# Patient Record
Sex: Male | Born: 1935
Health system: Southern US, Community
[De-identification: ages and names within clinical notes are randomized; demographics above are authoritative.]

## PROBLEM LIST (undated history)

## (undated) DIAGNOSIS — E785 Hyperlipidemia, unspecified: Secondary | ICD-10-CM

## (undated) DIAGNOSIS — J849 Interstitial pulmonary disease, unspecified: Secondary | ICD-10-CM

## (undated) DIAGNOSIS — K219 Gastro-esophageal reflux disease without esophagitis: Secondary | ICD-10-CM

## (undated) DIAGNOSIS — M1990 Unspecified osteoarthritis, unspecified site: Secondary | ICD-10-CM

## (undated) DIAGNOSIS — I10 Essential (primary) hypertension: Secondary | ICD-10-CM

## (undated) DIAGNOSIS — I2510 Atherosclerotic heart disease of native coronary artery without angina pectoris: Secondary | ICD-10-CM

## (undated) DIAGNOSIS — I2699 Other pulmonary embolism without acute cor pulmonale: Secondary | ICD-10-CM

## (undated) DIAGNOSIS — Z9889 Other specified postprocedural states: Secondary | ICD-10-CM

## (undated) DIAGNOSIS — I509 Heart failure, unspecified: Secondary | ICD-10-CM

## (undated) DIAGNOSIS — Z9861 Coronary angioplasty status: Principal | ICD-10-CM

## (undated) DIAGNOSIS — J479 Bronchiectasis, uncomplicated: Secondary | ICD-10-CM

## (undated) DIAGNOSIS — I272 Pulmonary hypertension, unspecified: Secondary | ICD-10-CM

## (undated) DIAGNOSIS — K2970 Gastritis, unspecified, without bleeding: Secondary | ICD-10-CM

## (undated) DIAGNOSIS — B9681 Helicobacter pylori [H. pylori] as the cause of diseases classified elsewhere: Secondary | ICD-10-CM

## (undated) DIAGNOSIS — I219 Acute myocardial infarction, unspecified: Secondary | ICD-10-CM

## (undated) HISTORY — DX: Hyperlipidemia, unspecified: E78.5

## (undated) HISTORY — DX: Essential (primary) hypertension: I10

## (undated) HISTORY — PX: CORONARY ANGIOPLASTY: SHX604

## (undated) HISTORY — DX: Other specified postprocedural states: Z98.890

## (undated) HISTORY — DX: Atherosclerotic heart disease of native coronary artery without angina pectoris: I25.10

## (undated) HISTORY — DX: Gastro-esophageal reflux disease without esophagitis: K21.9

## (undated) HISTORY — DX: Unspecified osteoarthritis, unspecified site: M19.90

## (undated) HISTORY — DX: Helicobacter pylori (H. pylori) as the cause of diseases classified elsewhere: B96.81

## (undated) HISTORY — DX: Coronary angioplasty status: Z98.61

## (undated) HISTORY — PX: CATARACT EXTRACTION: SUR2

## (undated) HISTORY — DX: Gastritis, unspecified, without bleeding: K29.70

## (undated) HISTORY — DX: Acute myocardial infarction, unspecified: I21.9

## (undated) HISTORY — PX: COLONOSCOPY: SHX174

---
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
## 2000-11-20 ENCOUNTER — Ambulatory Visit (HOSPITAL_COMMUNITY): Admission: RE | Admit: 2000-11-20 | Discharge: 2000-11-20 | Payer: Self-pay | Admitting: Ophthalmology

## 2001-01-01 ENCOUNTER — Ambulatory Visit (HOSPITAL_COMMUNITY): Admission: RE | Admit: 2001-01-01 | Discharge: 2001-01-01 | Payer: Self-pay | Admitting: Ophthalmology

## 2001-09-20 ENCOUNTER — Ambulatory Visit (HOSPITAL_COMMUNITY): Admission: RE | Admit: 2001-09-20 | Discharge: 2001-09-20 | Payer: Self-pay | Admitting: Family Medicine

## 2001-09-20 ENCOUNTER — Encounter: Payer: Self-pay | Admitting: Family Medicine

## 2001-12-11 ENCOUNTER — Encounter: Payer: Self-pay | Admitting: *Deleted

## 2001-12-11 ENCOUNTER — Inpatient Hospital Stay (HOSPITAL_COMMUNITY): Admission: EM | Admit: 2001-12-11 | Discharge: 2001-12-13 | Payer: Self-pay | Admitting: Emergency Medicine

## 2001-12-11 ENCOUNTER — Encounter: Payer: Self-pay | Admitting: Cardiovascular Disease

## 2001-12-11 HISTORY — PX: CARDIAC CATHETERIZATION: SHX172

## 2002-02-09 ENCOUNTER — Encounter: Payer: Self-pay | Admitting: Internal Medicine

## 2002-02-09 ENCOUNTER — Emergency Department (HOSPITAL_COMMUNITY): Admission: EM | Admit: 2002-02-09 | Discharge: 2002-02-09 | Payer: Self-pay | Admitting: Internal Medicine

## 2002-06-17 ENCOUNTER — Ambulatory Visit (HOSPITAL_COMMUNITY): Admission: RE | Admit: 2002-06-17 | Discharge: 2002-06-17 | Payer: Self-pay | Admitting: Family Medicine

## 2002-06-17 ENCOUNTER — Encounter: Payer: Self-pay | Admitting: Family Medicine

## 2003-07-13 ENCOUNTER — Observation Stay (HOSPITAL_COMMUNITY): Admission: EM | Admit: 2003-07-13 | Discharge: 2003-07-14 | Payer: Self-pay | Admitting: *Deleted

## 2003-07-23 ENCOUNTER — Ambulatory Visit (HOSPITAL_COMMUNITY): Admission: RE | Admit: 2003-07-23 | Discharge: 2003-07-23 | Payer: Self-pay | Admitting: Family Medicine

## 2003-08-16 ENCOUNTER — Observation Stay (HOSPITAL_COMMUNITY): Admission: EM | Admit: 2003-08-16 | Discharge: 2003-08-18 | Payer: Self-pay | Admitting: Emergency Medicine

## 2003-08-16 HISTORY — PX: NM MYOCAR PERF WALL MOTION: HXRAD629

## 2005-12-01 ENCOUNTER — Ambulatory Visit: Payer: Self-pay | Admitting: Internal Medicine

## 2006-05-21 ENCOUNTER — Encounter: Payer: Self-pay | Admitting: Internal Medicine

## 2006-05-21 DIAGNOSIS — K219 Gastro-esophageal reflux disease without esophagitis: Secondary | ICD-10-CM | POA: Insufficient documentation

## 2006-05-21 DIAGNOSIS — I252 Old myocardial infarction: Secondary | ICD-10-CM | POA: Insufficient documentation

## 2006-05-21 DIAGNOSIS — R32 Unspecified urinary incontinence: Secondary | ICD-10-CM | POA: Insufficient documentation

## 2006-05-21 DIAGNOSIS — N318 Other neuromuscular dysfunction of bladder: Secondary | ICD-10-CM | POA: Insufficient documentation

## 2006-05-21 DIAGNOSIS — H269 Unspecified cataract: Secondary | ICD-10-CM | POA: Insufficient documentation

## 2006-05-21 DIAGNOSIS — E785 Hyperlipidemia, unspecified: Secondary | ICD-10-CM

## 2006-05-21 DIAGNOSIS — M1990 Unspecified osteoarthritis, unspecified site: Secondary | ICD-10-CM | POA: Insufficient documentation

## 2006-05-21 DIAGNOSIS — I10 Essential (primary) hypertension: Secondary | ICD-10-CM

## 2007-01-30 ENCOUNTER — Inpatient Hospital Stay (HOSPITAL_COMMUNITY): Admission: EM | Admit: 2007-01-30 | Discharge: 2007-02-01 | Payer: Self-pay | Admitting: *Deleted

## 2007-01-30 ENCOUNTER — Encounter: Payer: Self-pay | Admitting: Emergency Medicine

## 2007-01-30 HISTORY — PX: CARDIAC CATHETERIZATION: SHX172

## 2008-10-14 ENCOUNTER — Emergency Department (HOSPITAL_COMMUNITY): Admission: EM | Admit: 2008-10-14 | Discharge: 2008-10-14 | Payer: Self-pay | Admitting: Emergency Medicine

## 2009-04-17 DIAGNOSIS — Z9889 Other specified postprocedural states: Secondary | ICD-10-CM

## 2009-04-17 HISTORY — DX: Other specified postprocedural states: Z98.890

## 2009-08-15 HISTORY — PX: UPPER GASTROINTESTINAL ENDOSCOPY: SHX188

## 2009-08-23 ENCOUNTER — Ambulatory Visit: Payer: Self-pay | Admitting: Gastroenterology

## 2009-08-23 ENCOUNTER — Inpatient Hospital Stay (HOSPITAL_COMMUNITY): Admission: EM | Admit: 2009-08-23 | Discharge: 2009-08-27 | Payer: Self-pay | Admitting: Emergency Medicine

## 2009-08-23 ENCOUNTER — Encounter (INDEPENDENT_AMBULATORY_CARE_PROVIDER_SITE_OTHER): Payer: Self-pay

## 2009-08-24 ENCOUNTER — Ambulatory Visit: Payer: Self-pay | Admitting: Gastroenterology

## 2009-08-25 ENCOUNTER — Ambulatory Visit: Payer: Self-pay | Admitting: Gastroenterology

## 2009-12-02 ENCOUNTER — Encounter: Payer: Self-pay | Admitting: Gastroenterology

## 2010-02-10 ENCOUNTER — Ambulatory Visit: Payer: Self-pay | Admitting: Gastroenterology

## 2010-02-10 DIAGNOSIS — K264 Chronic or unspecified duodenal ulcer with hemorrhage: Secondary | ICD-10-CM

## 2010-02-23 ENCOUNTER — Ambulatory Visit: Payer: Self-pay | Admitting: Gastroenterology

## 2010-02-23 ENCOUNTER — Ambulatory Visit (HOSPITAL_COMMUNITY): Admission: RE | Admit: 2010-02-23 | Discharge: 2010-02-23 | Payer: Self-pay | Admitting: Gastroenterology

## 2010-04-17 DIAGNOSIS — B9681 Helicobacter pylori [H. pylori] as the cause of diseases classified elsewhere: Secondary | ICD-10-CM

## 2010-04-17 DIAGNOSIS — K2970 Gastritis, unspecified, without bleeding: Secondary | ICD-10-CM

## 2010-04-17 HISTORY — DX: Helicobacter pylori (H. pylori) as the cause of diseases classified elsewhere: B96.81

## 2010-04-17 HISTORY — DX: Gastritis, unspecified, without bleeding: K29.70

## 2010-05-19 NOTE — Letter (Signed)
Summary: TCS ORDER  TCS ORDER   Imported By: Sofie Rower 02/10/2010 11:50:10  _____________________________________________________________________  External Attachment:    Type:   Image     Comment:   External Document

## 2010-05-19 NOTE — Letter (Signed)
Summary: CONSULTATION  CONSULTATION   Imported By: Hoy Morn 12/02/2009 09:38:57  _____________________________________________________________________  External Attachment:    Type:   Image     Comment:   External Document

## 2010-05-19 NOTE — Assessment & Plan Note (Signed)
Summary: SCREENING, DUODENAL ULCER   Visit Type:  Follow-up Visit Primary Care Provider:  Iona Beard, M.D.  Chief Complaint:  Melena.  History of Present Illness: No more black stool. No BRBPR. Took Abx for H. pylori W/O SIDE EFFCTS. No abd pain, change in bowel habits, constipation, or diarrhea. Never had a TCS. No CP or SOB. Appetite: good. Energy level: after playing 12-13 holes of golf feels like he's out of gas, but can complete 18 holes.  Current Medications (verified): 1)  Toprol Xl 100 Mg Tb24 (Metoprolol Succinate) .... Once Daily 2)  Diovan Hct 160-12.5 Mg Tabs (Valsartan-Hydrochlorothiazide) .... Once Daily 3)  Stomach Pill (Not Sure Name) .... Once Daily  Allergies (verified): No Known Drug Allergies  Past History:  Past Medical History: MELENA/DUODENAL ULCER **MAY 2011-EGD/BICAP/EPI H. PYLORI GASTRITIS, Rx: ABP GERD  Coronary artery disease/Myocardial infarction, hx of Hyperlipidemia Hypertension Osteoarthritis Urinary incontinence  Past Surgical History: CARDIAC CATH/stents 90s Cataract extraction  Family History: No FH of Colon Cancer or polyps  Social History: Retired Widowed 3 children No tobacco  Vital Signs:  Patient profile:   77 year old male Height:      63 inches Weight:      186 pounds BMI:     33.07 Temp:     98.2 degrees F oral Pulse rate:   72 / minute BP sitting:   142 / 70  (left arm) Cuff size:   large  Vitals Entered By: Waldon Merl LPN (October 27, 624THL 11:17 AM)  Physical Exam  General:  Well developed, well nourished, no acute distress. Head:  Normocephalic and atraumatic. Lungs:  Clear throughout to auscultation. Heart:  Regular rate and rhythm; no murmurs. Abdomen:  Soft, nontender and nondistended.Normal bowel sounds. obese.   Neurologic:  Alert and  oriented x4;  grossly normal neurologically. NL GAIT  Impression & Recommendations:  Problem # 1:  SCREENING, COLON CANCER (ICD-V76.51) Assessment New Pt has  never had TCS. Highly functional 77 yo male. TCS NOV 2011-1 hour slot, Halflytely prep. May continue ASA. OPV as needed.  Problem # 2:  DUODENAL ULCER (ICD-532.90) Assessment: Improved Pt on ASA and PPI.  Problem # 3:  MELENA, HX OF (ICD-V12.79) Assessment: Comment Only resolved.  CC: PCP  Appended Document: Orders Update    Clinical Lists Changes  Orders: Added new Service order of Est. Patient Level III [id number]) - Signed

## 2010-07-05 LAB — CBC
HCT: 25.5 % — ABNORMAL LOW (ref 39.0–52.0)
HCT: 27 % — ABNORMAL LOW (ref 39.0–52.0)
HCT: 27.7 % — ABNORMAL LOW (ref 39.0–52.0)
HCT: 29.3 % — ABNORMAL LOW (ref 39.0–52.0)
HCT: 37.9 % — ABNORMAL LOW (ref 39.0–52.0)
Hemoglobin: 10.1 g/dL — ABNORMAL LOW (ref 13.0–17.0)
Hemoglobin: 11.3 g/dL — ABNORMAL LOW (ref 13.0–17.0)
Hemoglobin: 13 g/dL (ref 13.0–17.0)
Hemoglobin: 8.8 g/dL — ABNORMAL LOW (ref 13.0–17.0)
Hemoglobin: 9.2 g/dL — ABNORMAL LOW (ref 13.0–17.0)
Hemoglobin: 9.5 g/dL — ABNORMAL LOW (ref 13.0–17.0)
MCHC: 34 g/dL (ref 30.0–36.0)
MCHC: 34.2 g/dL (ref 30.0–36.0)
MCHC: 34.3 g/dL (ref 30.0–36.0)
MCHC: 34.7 g/dL (ref 30.0–36.0)
MCHC: 34.9 g/dL (ref 30.0–36.0)
MCV: 85.6 fL (ref 78.0–100.0)
MCV: 85.8 fL (ref 78.0–100.0)
MCV: 86 fL (ref 78.0–100.0)
MCV: 86.3 fL (ref 78.0–100.0)
MCV: 86.4 fL (ref 78.0–100.0)
MCV: 86.8 fL (ref 78.0–100.0)
Platelets: 100 10*3/uL — ABNORMAL LOW (ref 150–400)
Platelets: 102 10*3/uL — ABNORMAL LOW (ref 150–400)
Platelets: 104 10*3/uL — ABNORMAL LOW (ref 150–400)
Platelets: 112 10*3/uL — ABNORMAL LOW (ref 150–400)
Platelets: 126 10*3/uL — ABNORMAL LOW (ref 150–400)
RBC: 2.95 MIL/uL — ABNORMAL LOW (ref 4.22–5.81)
RBC: 3.12 MIL/uL — ABNORMAL LOW (ref 4.22–5.81)
RBC: 3.35 MIL/uL — ABNORMAL LOW (ref 4.22–5.81)
RBC: 3.42 MIL/uL — ABNORMAL LOW (ref 4.22–5.81)
RBC: 3.77 MIL/uL — ABNORMAL LOW (ref 4.22–5.81)
RBC: 4.41 MIL/uL (ref 4.22–5.81)
RDW: 11.7 % (ref 11.5–15.5)
RDW: 12.2 % (ref 11.5–15.5)
RDW: 12.4 % (ref 11.5–15.5)
WBC: 11.1 10*3/uL — ABNORMAL HIGH (ref 4.0–10.5)
WBC: 11.2 10*3/uL — ABNORMAL HIGH (ref 4.0–10.5)
WBC: 14.4 10*3/uL — ABNORMAL HIGH (ref 4.0–10.5)
WBC: 16.7 10*3/uL — ABNORMAL HIGH (ref 4.0–10.5)
WBC: 21.8 K/uL — ABNORMAL HIGH (ref 4.0–10.5)
WBC: 8.2 10*3/uL (ref 4.0–10.5)
WBC: 8.8 10*3/uL (ref 4.0–10.5)

## 2010-07-05 LAB — COMPREHENSIVE METABOLIC PANEL WITH GFR
ALT: 16 U/L (ref 0–53)
AST: 21 U/L (ref 0–37)
Albumin: 3.8 g/dL (ref 3.5–5.2)
Alkaline Phosphatase: 38 U/L — ABNORMAL LOW (ref 39–117)
BUN: 61 mg/dL — ABNORMAL HIGH (ref 6–23)
Calcium: 9.5 mg/dL (ref 8.4–10.5)
Chloride: 103 meq/L (ref 96–112)
GFR calc Af Amer: 58 mL/min — ABNORMAL LOW (ref >60)
Potassium: 3.5 meq/L (ref 3.5–5.1)
Total Protein: 7.3 g/dL (ref 6.0–8.3)

## 2010-07-05 LAB — DIFFERENTIAL
Basophils Absolute: 0 10*3/uL (ref 0.0–0.1)
Basophils Absolute: 0 10*3/uL (ref 0.0–0.1)
Basophils Absolute: 0.1 10*3/uL (ref 0.0–0.1)
Basophils Absolute: 0.1 K/uL (ref 0.0–0.1)
Basophils Relative: 0 % (ref 0–1)
Basophils Relative: 0 % (ref 0–1)
Basophils Relative: 0 % (ref 0–1)
Basophils Relative: 1 % (ref 0–1)
Basophils Relative: 1 % (ref 0–1)
Eosinophils Absolute: 0.1 10*3/uL (ref 0.0–0.7)
Eosinophils Absolute: 0.1 K/uL (ref 0.0–0.7)
Eosinophils Absolute: 0.3 10*3/uL (ref 0.0–0.7)
Eosinophils Absolute: 0.4 10*3/uL (ref 0.0–0.7)
Eosinophils Absolute: 0.5 10*3/uL (ref 0.0–0.7)
Eosinophils Relative: 0 % (ref 0–5)
Eosinophils Relative: 0 % (ref 0–5)
Eosinophils Relative: 1 % (ref 0–5)
Eosinophils Relative: 1 % (ref 0–5)
Eosinophils Relative: 4 % (ref 0–5)
Eosinophils Relative: 5 % (ref 0–5)
Lymphocytes Relative: 12 % (ref 12–46)
Lymphocytes Relative: 23 % (ref 12–46)
Lymphocytes Relative: 24 % (ref 12–46)
Lymphocytes Relative: 32 % (ref 12–46)
Lymphs Abs: 1.5 10*3/uL (ref 0.7–4.0)
Lymphs Abs: 2.1 10*3/uL (ref 0.7–4.0)
Lymphs Abs: 2.6 10*3/uL (ref 0.7–4.0)
Lymphs Abs: 2.6 10*3/uL (ref 0.7–4.0)
Lymphs Abs: 2.8 10*3/uL (ref 0.7–4.0)
Lymphs Abs: 3.3 10*3/uL (ref 0.7–4.0)
Monocytes Absolute: 0.6 10*3/uL (ref 0.1–1.0)
Monocytes Absolute: 0.6 10*3/uL (ref 0.1–1.0)
Monocytes Absolute: 0.6 10*3/uL (ref 0.1–1.0)
Monocytes Absolute: 0.7 10*3/uL (ref 0.1–1.0)
Monocytes Absolute: 0.9 K/uL (ref 0.1–1.0)
Monocytes Absolute: 1.2 10*3/uL — ABNORMAL HIGH (ref 0.1–1.0)
Monocytes Relative: 4 % (ref 3–12)
Monocytes Relative: 6 % (ref 3–12)
Monocytes Relative: 6 % (ref 3–12)
Monocytes Relative: 7 % (ref 3–12)
Monocytes Relative: 8 % (ref 3–12)
Neutro Abs: 18.1 K/uL — ABNORMAL HIGH (ref 1.7–7.7)
Neutro Abs: 4.9 10*3/uL (ref 1.7–7.7)
Neutro Abs: 7.4 10*3/uL (ref 1.7–7.7)
Neutrophils Relative %: 56 % (ref 43–77)
Neutrophils Relative %: 63 % (ref 43–77)
Neutrophils Relative %: 66 % (ref 43–77)
Neutrophils Relative %: 83 % — ABNORMAL HIGH (ref 43–77)
Neutrophils Relative %: 84 % — ABNORMAL HIGH (ref 43–77)
Smear Review: DECREASED

## 2010-07-05 LAB — COMPREHENSIVE METABOLIC PANEL
Albumin: 3 g/dL — ABNORMAL LOW (ref 3.5–5.2)
CO2: 25 mEq/L (ref 19–32)
CO2: 28 mEq/L (ref 19–32)
Calcium: 8.1 mg/dL — ABNORMAL LOW (ref 8.4–10.5)
Chloride: 108 mEq/L (ref 96–112)
Creatinine, Ser: 1.41 mg/dL (ref 0.4–1.5)
GFR calc non Af Amer: 48 mL/min — ABNORMAL LOW (ref >60)
GFR calc non Af Amer: 49 mL/min — ABNORMAL LOW (ref >60)
Glucose, Bld: 171 mg/dL — ABNORMAL HIGH (ref 70–99)
Potassium: 4 mEq/L (ref 3.5–5.1)
Sodium: 139 mEq/L (ref 135–145)
Total Bilirubin: 1.4 mg/dL — ABNORMAL HIGH (ref 0.3–1.2)
Total Bilirubin: 1.4 mg/dL — ABNORMAL HIGH (ref 0.3–1.2)

## 2010-07-05 LAB — CK TOTAL AND CKMB (NOT AT ARMC)
CK, MB: 1.3 ng/mL (ref 0.3–4.0)
Relative Index: INVALID (ref 0.0–2.5)

## 2010-07-05 LAB — SAMPLE TO BLOOD BANK

## 2010-07-05 LAB — BASIC METABOLIC PANEL WITH GFR
BUN: 19 mg/dL (ref 6–23)
CO2: 29 meq/L (ref 19–32)
Calcium: 8.4 mg/dL (ref 8.4–10.5)
Chloride: 108 meq/L (ref 96–112)
Creatinine, Ser: 1.35 mg/dL (ref 0.4–1.5)
GFR calc non Af Amer: 50 mL/min — ABNORMAL LOW
Glucose, Bld: 96 mg/dL (ref 70–99)
Potassium: 3.9 meq/L (ref 3.5–5.1)
Sodium: 141 meq/L (ref 135–145)

## 2010-07-05 LAB — URINE MICROSCOPIC-ADD ON

## 2010-07-05 LAB — PROTIME-INR
INR: 1.01 (ref 0.00–1.49)
Prothrombin Time: 13.2 s (ref 11.6–15.2)

## 2010-07-05 LAB — CARDIAC PANEL(CRET KIN+CKTOT+MB+TROPI)
CK, MB: 0.8 ng/mL (ref 0.3–4.0)
Relative Index: INVALID (ref 0.0–2.5)
Total CK: 57 U/L (ref 7–232)

## 2010-07-05 LAB — URINALYSIS, ROUTINE W REFLEX MICROSCOPIC
Bilirubin Urine: NEGATIVE
Glucose, UA: NEGATIVE mg/dL
Glucose, UA: NEGATIVE mg/dL
Hgb urine dipstick: NEGATIVE
Hgb urine dipstick: NEGATIVE
Ketones, ur: NEGATIVE mg/dL
Ketones, ur: NEGATIVE mg/dL
Leukocytes, UA: NEGATIVE
Nitrite: NEGATIVE
Protein, ur: NEGATIVE mg/dL
Specific Gravity, Urine: 1.025 (ref 1.005–1.030)
Urobilinogen, UA: 0.2 mg/dL (ref 0.0–1.0)
pH: 5.5 (ref 5.0–8.0)

## 2010-07-05 LAB — URINE CULTURE
Colony Count: NO GROWTH
Special Requests: NEGATIVE

## 2010-07-05 LAB — BASIC METABOLIC PANEL
CO2: 29 mEq/L (ref 19–32)
Calcium: 9 mg/dL (ref 8.4–10.5)
Creatinine, Ser: 1.23 mg/dL (ref 0.4–1.5)
GFR calc Af Amer: >60 mL/min (ref >60)
Glucose, Bld: 100 mg/dL — ABNORMAL HIGH (ref 70–99)

## 2010-07-05 LAB — APTT: aPTT: 26 seconds (ref 24–37)

## 2010-08-25 ENCOUNTER — Telehealth: Payer: Self-pay

## 2010-08-25 NOTE — Telephone Encounter (Signed)
Per Manuela Schwartz, pt is scheduled for 08/30/2010.

## 2010-08-25 NOTE — Telephone Encounter (Signed)
Pt returned call. I told him it hasn't been long since he had colonoscopy. But he now says he is having some abdominal swelling. So i told him Manuela Schwartz will schedule OV.

## 2010-08-25 NOTE — Telephone Encounter (Signed)
Please call pt. He needs OPV to discuss his Sx. His last TCS was NOV 2011.

## 2010-08-25 NOTE — Telephone Encounter (Signed)
Pt is aware of OV for 5/15 @ 1130 with AS

## 2010-08-25 NOTE — Telephone Encounter (Signed)
Pt called to see if Dr. Berdine Addison has sent referral for colonoscopy. We have not received one, but he said he had one not too long ago. Per EMR he had one 02/23/2010. When is his next one due? Pt says he is not having any problems at this time.

## 2010-08-30 ENCOUNTER — Encounter: Payer: Self-pay | Admitting: Gastroenterology

## 2010-08-30 ENCOUNTER — Ambulatory Visit (INDEPENDENT_AMBULATORY_CARE_PROVIDER_SITE_OTHER): Payer: Medicare Other | Admitting: Gastroenterology

## 2010-08-30 DIAGNOSIS — R110 Nausea: Secondary | ICD-10-CM

## 2010-08-30 DIAGNOSIS — K269 Duodenal ulcer, unspecified as acute or chronic, without hemorrhage or perforation: Secondary | ICD-10-CM

## 2010-08-30 MED ORDER — PANTOPRAZOLE SODIUM 40 MG PO TBEC: 40.0000 mg | DELAYED_RELEASE_TABLET | Freq: Every day | ORAL | Status: DC

## 2010-08-30 NOTE — Assessment & Plan Note (Signed)
Several month hx of nausea, sensation of bloating upper abdomen, specifically after eating. Seems to correspond to lack of PPI, which he has not been taking for awhile. Hx of duodenal ulcer. Will resume Protonix daily. Rx provided. Return in 3 months or sooner if warranted.

## 2010-08-30 NOTE — Cardiovascular Report (Signed)
NAMEYADIER, Anthony NO.:  [id number]   MEDICAL RECORD NO.:  [id number]          PATIENT TYPE:  INP   LOCATION:  2916                         FACILITY:  [facility]   PHYSICIAN:  Quay Burow, M.D.   DATE OF BIRTH:  [birth year]   DATE OF PROCEDURE:  01/30/2007  DATE OF DISCHARGE:                            CARDIAC CATHETERIZATION   Anthony Galvan is an 77 year old African American male with history of  noncritical CAD by cath in 2003 by [facility]. Einar Gip, MD, with a left  dominant system.  Hospitalized other problems include hypertension and  dyslipidemia.  He was admitted in transfer from [facility] the  morning of January 30, 2007, with chest pain.  He ruled out for  myocardial infarction.  No ischemic changes on his electrocardiogram.  He was treated with IV heparin and nitroglycerin and presents here for  diagnostic coronary arteriography to define his anatomy and rule out an  ischemic etiology.   DESCRIPTION OF PROCEDURE:  The patient was brought to the second floor  [facility] in the postabsorptive state.  He was  premedicated with p.o. Valium.  His right groin was prepped and shaved  in usual sterile fashion.  I used 1% Xylocaine for local anesthesia.  A  6-French sheath was inserted into the right femoral artery using  standard Seldinger technique.  A 6-French right and left Judkins  diagnostic catheters as well as a [place] pigtail catheter were used for  selective coronary angiography, left ventriculography and distal  abdominal aortography.  Visipaque dye was used for the entirety of the  case.  Retrograde aortic, ventricular and pull back pressures were  recorded.   HEMODYNAMIC RESULTS:  Aortic systolic pressure Q000111Q, diastolic pressure  64.  Ventricle systolic pressure [id number], end-diastolic pressure 20.   SELECTIVE CHOLANGIOGRAPHY:  1. Left main normal.  2. LAD; LAD had 75% ostial lesion in the takeoff of first small to  moderate-sized diagonal branch.  There was a 50% mid lesion in the      LAD itself as well.  3. Left circumflex; this is a dominant vessel with 50 and 70% lesions      in the PDA.  4. Ramus branch; moderate size vessel free of significant disease.  5. Right coronary artery; nondominant vessel with a 75% proximal      stenosis.  6. Left ventriculography; RAO left ventriculogram was performed using      a 20 mL of Visipaque dye at 10 mL per second.  The overall LVEF was      estimated at greater than 60% without focal wall motion      abnormalities.  7. Distal abdominal aortography; distal abdominal aortogram was      performed using 20 mL of Visipaque dye at 20 mL per second.  There      was approximately 50% right renal artery stenosis.  Infrarenal      abdominal aorta and iliac bifurcation appear free of significant      atherosclerotic changes.   IMPRESSION:  [name] has noncritical coronary artery disease.  He  does have a hypodense lesion in his posterior descending artery which is  somewhat suspicious, although I doubt this could be causing his  symptoms.  An ACT was drawn and it was less than 200.  A femoral  angiogram was performed showing suitability for [employer] hemostasis  which was attempted but failed because of technical reasons secondary to  device malfunction.  Pressure was held on the groin to achieve  hemostasis.  The patient left the lab in stable condition.   IMPRESSION:  Noncritical coronary artery disease, continued medical  therapy will be recommended.  The patient left the lab in stable  condition.      Quay Burow, M.D.  Electronically Signed     JB/MEDQ  D:  01/30/2007  T:  01/31/2007  Job:  [id number]   cc:   Chart  2nd Floor [facility]  [place]

## 2010-08-30 NOTE — Progress Notes (Signed)
Referring Provider: No ref. provider found Primary Care Physician:  Maggie Font, MD Primary Gastroenterologist: Dr. Oneida Alar  Chief Complaint  Patient presents with  . Stomach Swelling    HPI:   Anthony Galvan is a pleasant 77 year old male who presents in follow-up. Has hx of duodenal ulcer, H.pylori gastritis in past. Was taking PPI at last visit, but he is not taking this anymore. Concerned about upper/epigastric bloating, no pain. Complains of intermittent nausea, feels like he is getting ready to "upchuck". No emesis. Usually nauseated after eating. Symptoms occurring for a few months. Denies melena or brbpr. Doing well otherwise, in good spirits.    Past Medical History  Diagnosis Date  . Duodenal ulcer May 2011    on EGD  . Helicobacter pylori gastritis   . GERD (gastroesophageal reflux disease)   . CAD (coronary artery disease)   . MI (myocardial infarction)   . Hyperlipidemia   . HTN (hypertension)   . Osteoarthritis   . S/P colonoscopy 2011    normal, internal hemorrhoids    Past Surgical History  Procedure Date  . Cardiac cath with stents   . Cataracts     Current Outpatient Prescriptions  Medication Sig Dispense Refill  . aspirin 81 MG tablet Take 81 mg by mouth daily.        . hydrALAZINE (APRESOLINE) 10 MG tablet Take 10 mg by mouth 3 (three) times daily.        Marland Kitchen loratadine (CLARITIN) 10 MG tablet Take 10 mg by mouth daily.        . metoprolol (TOPROL-XL) 100 MG 24 hr tablet Take 100 mg by mouth daily.        . valsartan (DIOVAN) 320 MG tablet Take 320 mg by mouth daily.                      Allergies as of 08/30/2010  . (No Known Allergies)    Family History  Problem Relation Age of Onset  . Colon cancer Neg Hx     History   Social History  . Marital Status: Widowed    Spouse Name: N/A    Number of Children: N/A  . Years of Education: N/A   Social History Main Topics  . Smoking status: Never Smoker   . Smokeless tobacco: None  . Alcohol  Use: No  . Drug Use: No  . Sexually Active: None   Other Topics Concern  . None   Social History Narrative  . None    Review of Systems: Gen: Denies fever, chills, anorexia. Denies fatigue, weakness, weight loss.  CV: Denies chest pain, palpitations, syncope, peripheral edema, and claudication. Resp: Denies dyspnea at rest, cough, wheezing, coughing up blood, and pleurisy. GI: Denies vomiting blood, jaundice, and fecal incontinence.   Denies dysphagia or odynophagia. Derm: Denies rash, itching, dry skin Psych: Denies depression, anxiety, memory loss, confusion. No homicidal or suicidal ideation.  Heme: Denies bruising, bleeding, and enlarged lymph nodes.  Physical Exam: BP 180/91  Pulse 76  Temp(Src) 98 F (36.7 C) (Temporal)  Ht 5\' 3"  (1.6 m)  Wt 186 lb 3.2 oz (84.46 kg)  BMI 32.98 kg/m2 General:   Alert and oriented. No distress noted. Pleasant and cooperative.  Head:  Normocephalic and atraumatic. Eyes:  Conjuctiva clear without scleral icterus. Mouth:  Oral mucosa pink and moist. Good dentition. No lesions. Heart:  S1, S2 present without murmurs, rubs, or gallops. Regular rate and rhythm. Abdomen:  +BS, soft, non-tender and  non-distended. No rebound or guarding. No HSM or masses noted. Msk:  Symmetrical without gross deformities. Normal posture. Extremities:  Without edema. Neurologic:  Alert and  oriented x4;  grossly normal neurologically. Skin:  Intact without significant lesions or rashes. Psych:  Alert and cooperative. Normal mood and affect.

## 2010-08-30 NOTE — Assessment & Plan Note (Signed)
Documented on EGD May 2011. Not currently taking a PPI. Resume PPI at once daily dosing. See "nausea" plan.

## 2010-08-30 NOTE — Patient Instructions (Signed)
Start taking Protonix 40 mg daily, 30 minutes before breakfast.  Contact our office if symptoms continue or worsen.  We will see you in 3 months.

## 2010-08-31 NOTE — Progress Notes (Signed)
Cc to PCP 

## 2010-09-02 NOTE — Discharge Summary (Signed)
NAME:  Anthony Galvan, Anthony Galvan                          ACCOUNT NO.:  [id number]   MEDICAL RECORD NO.:  [id number]                   PATIENT TYPE:  INP   LOCATION:  IC09                                 FACILITY:  [facility]   PHYSICIAN:  Vanetta Mulders. Dechurch, M.D.           DATE OF BIRTH:  [birth year]   DATE OF ADMISSION:  08/16/2003  DATE OF DISCHARGE:  08/18/2003                                 DISCHARGE SUMMARY   DIAGNOSES:  1. Chest pain with Persantine stress test without evidence of ischemia or ST     changes.  2. Hypertension, uncontrolled.  3. Probable medicine noncompliance.  4. Coronary artery disease, insignificant per catheterization April, 2003.   DISPOSITION:  1. The patient is discharged to home.  Follow up with Dr. Mathis Bud in one     month.  2. Follow up with Dr. Berdine Addison as scheduled.   MEDICATIONS:  1. Diovan 160/ 12.5 daily.  2. Toprol XL, 100 daily.  3. Caduet 5/20 daily.  4. Enteric-coated aspirin, 325 daily.   HOSPITAL COURSE:  A pleasant, 77 year old gentleman with a history of  hypertension and nonobstructive coronary artery disease evaluated at [facility] as well as [facility] who is followed by [facility], and Dr.  Berdine Addison, who presented to the emergency room, he states, with a pounding  rushing sensation as if there was something in his artery, and apparently  had some chest pressure radiating into his left arm, though he does not  recall that at this point.  In any event, his enzymes and EKG remained  stable.  He was admitted to the hospital on heparin, nitroglycerin and  Lovenox.   He was seen in consultation by cardiology.  His nitroglycerin was titrated  without difficulty.  He underwent Persantine stress test without evidence of  ischemia.  The patient was felt to be stable for discharge.  His blood  pressures in the hospital were in the 160's.  Norvasc was added to his  regimen.  The patient was unaware of what medications he was actually taking  at  home.  According to the pharmacist, he was somewhat noncompliant with his  recommendations.  He was recently started on Caduet which is still waiting  for him at the pharmacy.  He was instructed to take all of his medications  as well as to avoid duplication of therapy if he had Lipitor and Norvasc at  home.  He was instructed to call should there be any questions or confusion.  He was also instructed to take all of his medications along with the  discharge instruction sheets, to his followup appointment.   DISCHARGE CONDITION:  The patient was stable at the time of discharge.   PHYSICAL EXAMINATION:  GENERAL:  A pleasant, elderly gentleman.  VITAL SIGNS:  Blood pressure 160/75, pulse 68 and regular, respirations  unlabored.  O2 saturations are 100%.  NECK:  Supple, no JVD, no adenopathy.  LUNGS:  Clear to auscultation.  HEART:  Regular, no murmur.  ABDOMEN:  Soft, obese, nontender.  EXTREMITIES:  Without clubbing, cyanosis, no edema.  NEUROLOGIC:  The patient is alert, pleasant, oriented, nonfocal findings.   DISCHARGE INSTRUCTIONS:  He is discharged to home in stable condition, with  followup as noted above.     ___________________________________________                                         Vanetta Mulders. Hillery Jacks, M.D.   FED/MEDQ  D:  08/18/2003  T:  08/19/2003  Job:  [id number]   cc:   Barrie Folk. Berdine Addison, M.D.  [address]  [place]  [place] [address]  Fax: [phone]   Leslye Peer, MD  Fax: [phone]

## 2010-09-02 NOTE — H&P (Signed)
NAMEMarland Kitchen  Galvan, Anthony Galvan                          ACCOUNT NO.:  [id number]   MEDICAL RECORD NO.:  [id number]                   PATIENT TYPE:  EMS   LOCATION:  ED                                   FACILITY:  [facility]   PHYSICIAN:  Benito Mccreedy, M.D.             DATE OF BIRTH:  11-Aug-1921   DATE OF ADMISSION:  07/12/2003  DATE OF DISCHARGE:                                HISTORY & PHYSICAL   CHIEF COMPLAINT:  Chest pain.   HISTORY OF PRESENT ILLNESS:  The patient presented with a 30-minute history  of sternal chest pain which is described as like a knot screwing in my  chest, associated with nausea, shortness of breath, without any vomiting or  diaphoresis. The patient says he was asleep and lasted about 30 minutes, and  was relieved after receiving three nitroglycerin in the emergency  department. She said the pain was moderate to severe in intensity and by the  time of ER evaluation the pain had resolved after nitroglycerin as mentioned  above. He does not have any history of fever, chills, ankle swelling, calf  or leg pain, any visual changes, any abdominal pain, black or bloody stools,  severe or frequent micturition. He, however, admits to cough occasionally.   PAST MEDICAL HISTORY:  History of CAD, status post cardiac catheterization  on December 12, 2001, by Dr. Adrian Prows of [facility].  This cath  revealed an EF of 60%, with marked 20% LAD, __________ 20% proximal  stenosis, and very mild disease of the circumflex. He has a history of  hypertension, dyslipidemia. He is status post cataract surgery.   His current medications include Toprol, aspirin, Lipitor, Diovan, dose of  which are unclear.  However, the patient was discharged home in 2003 on  Lipitor 20 mg daily, aspirin 325 mg daily, Diovan/HCT 150/12.5 mg daily,  Toprol XL 75 mg daily.   The patient does not smoke cigarettes or drink alcohol.   PHYSICAL EXAMINATION:  VITAL SIGNS: Blood pressure 153/88, pulse  83,  respiratory rate 20, oxygen saturation of 98% on room air.  GENERAL: The patient is not in any distress. He was not in cardiopulmonary  or painful distress.  HEENT: Normocephalic and atraumatic. Oropharynx is clear. No conjunctival  pallor or icterus.  NECK: Supple with no JVD.  LUNGS: Vesicular breath sounds. No wheezes or crackles.  CARDIAC: Regular rate and rhythm without any gallops or murmurs.  ABDOMEN: Obese, soft, and nontender.  Bowel sounds were present.  EXTREMITIES: He had trace bipedal pitting edema.  NEUROLOGIC: He is alert and oriented times three. No acute focal deficits.   EKG shows sinus tachycardia at 101 per minute, left __________ . He did not  have any obvious acute ST-changes in contiguous leads. Chest x-ray did not  reveal any acute infiltrates.   Labwork reveals WBC count of 13.0, hemoglobin 14.1, hematocrit 41.9, MCV  85.8, platelet count 133,000.  CPK total 145, MB 1.5, relative index 1.0,  troponin-I 0.02.  Basic metabolic panel was unremarkable and within normal  limit, except for mildly elevated glucose of 113 mg/dl.   ASSESSMENT:  Chest pain in a patient with mild coronary artery disease.  The patient will be admitted to a telemetry bed, rule out myocardial  infarction/ischemia. The patient is chest pain free now.  His blood pressure  was very high at 219/119 with a heart rate of 106 on presentation and had  come down after Toprol and nitroglycerin in the emergency department. The  patient was chest pain free now and will admit him to the telemetry bed.  Will rule out by serial cardiac enzymes. The patient was seen in  consultation by [facility] who had seen the patient previously.     ___________________________________________                                         Benito Mccreedy, M.D.   GO/MEDQ  D:  07/13/2003  T:  07/13/2003  Job:  [id number]   cc:   Patrick Jupiter A. Walker Kehr, M.D.  Fax: [phone]

## 2010-09-02 NOTE — Discharge Summary (Signed)
NAME:  Anthony Galvan, Anthony Galvan                          ACCOUNT NO.:  [id number]   MEDICAL RECORD NO.:  [id number]                   PATIENT TYPE:  INP   LOCATION:  3731                                 FACILITY:  [place]   PHYSICIAN:  Eden Lathe. Einar Gip, M.D.                  DATE OF BIRTH:  03-12-1922   DATE OF ADMISSION:  12/11/2001  DATE OF DISCHARGE:  12/13/2001                                 DISCHARGE SUMMARY   ADMISSION DIAGNOSES:  1. Unstable angina.  2. Coronary artery disease; a)  Status post cardiac catheterizations in     1976, 1980, 1992 and 1996.  According to the patient he had intervention     performed at the time of catheterization in 1980 and also 1996.  3. Hypertension.  4. Hyperlipidemia.  5. History of cataract surgery.   DISCHARGE DIAGNOSES:  1. Unstable angina.  2. Coronary artery disease; a)  Status post cardiac catheterizations in     1976, 1980, [place].  According to the patient he had intervention     performed at the time of catheterization in 1980 and also 1996.  3. Hypertension.  4. Hyperlipidemia.  5. History of cataract surgery.  6. Status post cardiac catheterization on December 12, 2001 by Dr. Adrian Prows     which revealed normal left ventricular function, an ejection fraction of     60%, mild 20% LAD, large diagonal one with a 20% proximal stenosis and     very mild disease of the circumflex.  It was felt that there was no     significant coronary artery disease and that the patient had non-cardiac     chest pain.   HISTORY OF PRESENT ILLNESS:  The patient is a 77 year-old African-American  male with a history of coronary artery disease as well as hypertension and  hyperlipidemia.  He presented to [facility] emergency room on  December 11, 2001 with complaints of chest pain.   He said that morning at about 9 a.m. he was sweeping the floor when he  developed a heaviness and a tightness in the center of his chest.  There was  no radiation of the  pain, no shortness of breath, diaphoresis, nausea or  vomiting.  He stopped and went to Dr. Cathey Endow office, but he was not able to  be seen there.  From there he went to [facility] ER.  He was given  sublingual nitroglycerin times two.  He says that after the first  nitroglycerin the chest pain decreased within about ten minutes.  With the  second nitroglycerin it decreased even further within about ten minutes of  time of the nitroglycerin.  Otherwise, he had had continuous chest pains  from the time of onset until the time he got the nitroglycerin which was  approximately one hour.  It had completely resolved within 30 minutes  of the  first nitroglycerin and was completely resolved at the time of our  evaluation.   He says that in the past when he would have chest pain he would apply a  nitroglycerin patch for relief. However, he had run out of nitroglycerin  patches and said that he had not used a patch in about six or eight months  now.  He had been experiencing no heaviness or tightness prior to the day of  admission and had never awoken with chest pain or PND.   On exam his heart rate was elevated at 93 and blood pressure was 165/93.  There was no significant abnormalities on physical examination otherwise.  EKG showed normal sinus rhythm and no ST-T change.  Initial cardiac enzymes  were negative and otherwise stable.   At this time he was seen and evaluated by Dr. Adrian Prows.  We planned to  increase his beta blocker secondary to elevated heart rate and blood  pressure.  It was reviewed that his EKG had no significant change and the  first set of enzymes was negative.  However, symptoms were worrisome for  angina.  Therefore, it was felt that we needed to proceed with definitive  cardiac catheterization.  We would check serial enzymes to rule out MI,  place him on medication and schedule him for cardiac catheterization. The  risks and benefits were discussed and he was  willing to proceed.   HOSPITAL COURSE:  On the morning of December 12, 2001 the patient was without  chest pain or other complaints.  Enzymes had remained negative and he had  remained stable.   On December 12, 2001 he underwent cardiac catheterization by Dr. Adrian Prows.  Please see his dictated report for details.  He had normal left ventricular  function with an ejection fraction of 60%, mild 20% mid LAD, proximal 20%  diagonal one stenosis and he had mild disease of the circumflex and non-  dominant right coronary artery.  It was felt that his chest pain was non-  cardiac in nature and that he may need an evaluation for gastroesophageal  reflux.  As well, his renal arteries were essentially normal.   On the following day on December 13, 2001, the patient remained stable and  continued to have no chest pain or other significant problems. Laboratories  remained stable. He was hemodynamically stable with a blood pressure of  124/78, a heart rate of 72, oxygen saturation 95%.  The right groin has no  hematoma or bleed.  At this time he was seen and evaluated by Dr. Alla German who deemed him stable for discharge home.  Because he has had no  further chest pain, we do not think that other workup of his chest pain  necessary at this time, but he may need a GI evaluation as an outpatient.   HOSPITAL CONSULTS:  None.   HOSPITAL PROCEDURES:  1. Cardiac catheterization on December 11, 2001 by Dr. Adrian Prows.  He found     the following problems: a)  Right coronary artery a non-dominant vessel,     very small; b)  Left main large vessel and normal; c)  Left circumflex     large vessel, dominant vessel, gives rise to very large OM1 and OM2 and     gives origin to several PDA branches and has mild diffuse disease; d) LAD     - large caliber vessel, mid 10 to 20% stenosis, gives origin to a  moderate size diagonal one, moderate size diagonal two vessel, the    diagonal one has a proximal 20 to 30%  stenosis; e)  Ramus intermedius has     mild diffuse disease; f)  Renal arteriography showed widely patent renal     arteries.  There was normal left ventricular systolic function with an     ejection fraction of 50%.   The patient tolerated the procedure well and had no complications.  It was  felt that the patient needed risk factor modification.  Evaluation for non-  cardiac causes of chest pain may be indicated.   EKG showed normal sinus rhythm and nonspecific ST-T change.   LABORATORY DATA:  Lipid profile showed a total cholesterol of 190,  triglycerides 118, HDL 61, LDL 105.  Cardiac enzymes were negative with CK's  of 118 and 116, MB of 1.2 and 1.2 and Troponin 0.02 and 0.02.  White count  8.6, hemoglobin 13.3, hematocrit 40.4 and platelets 106,000; these remained  stable with no significant variation.   Chest x-ray showed no active disease.   DISCHARGE MEDICATIONS:  1. Lipitor 10 mg once a day.  2. Diovan/HCT 160/12.5 mg once a day.  3. Toprol XL 75 mg once a day which is one and a half 50 mg tablets.  4. Enteric coated aspirin 325 mg once a day.   ACTIVITY:  No strenuous activity or lifting greater than five pounds or  driving for three days.   DIET:  Low salt, low, low cholesterol diet.   WOUND CARE:  May gently wash the groin site with warm water and soap.   SPECIAL INSTRUCTIONS:  Call [phone] if any bleeding or increased pain at  groin site.   FOLLOW UP:  Follow-up with Dr. Einar Gip on Wednesday, September 17 at 2:30 p.m.  He was given the address and phone number of our office.      Mary B. Clair Gulling, P.A.-C.                   Eden Lathe. Einar Gip, M.D.    MBE/MEDQ  D:  12/25/2001  T:  12/26/2001  Job:  [id number]   cc:   Barrie Folk. Berdine Addison, M.D.

## 2010-09-02 NOTE — H&P (Signed)
NAME:  Anthony, Galvan NO.:  [id number]   MEDICAL RECORD NO.:  [id number]                   PATIENT TYPE:  INP   LOCATION:  IC09                                 FACILITY:  [facility]   PHYSICIAN:  Unk Lightning, MD        DATE OF BIRTH:  [birth year]   DATE OF ADMISSION:  08/16/2003  DATE OF DISCHARGE:                                HISTORY & PHYSICAL   HISTORY OF PRESENT ILLNESS:  The patient is a 77 year old black male with a  histor of minimal coronary artery disease, last report is 20% blockages in  LAD and first diagonal.  However, he did have another catheterization, and I  do not know the results of this.  he was admitted one month ago with some  chest pain.  I do not know the results, or if he had a nuclear stress test.  He comes in tonight with no pre antecedent chest pain for one week.  Awoken  by retrosternal chest pressure radiating to his left arm.  He describes some  radiation to his left neck with increased cough.  He denies dyspnea,  diaphoresis, nausea, vomiting.  The pain persisted for two hours while at  rest.  He comes to the hospital with normal cardiac enzymes, normal EKG and  hemodynamically stable.  He is admitted for vigilance and antiplatelet  therapy and nitroglycerin.  He also admits that he may have had a 70%  blockage in the year 2000, but he is not quite certain of this.   PAST MEDICAL HISTORY:  Significant for hypertension, hyperlipidemia and  coronary artery disease.   PAST SURGICAL HISTORY:  Remarkable for cataract surgery.   ALLERGIES:  No known allergies.   CURRENT MEDICATIONS:  1. Diovan HCT 160/ 12.5 daily.  2. Toprol 75 daily.  3. Aspirin 325 daily.  4. Lipitor 20 daily.  5. Two other blood pressure medicines he is unsure of.   ALLERGIES:  No known allergies.   PHYSICAL EXAMINATION:  VITAL SIGNS:  Blood pressure 125/72, pulse 87 and  regular, he has 97% saturations.  Respiratory rate 16.  Pulse 80  and  regular.  HEENT:  Normocephalic, atraumatic.  Eyes:  PERRLA, extraocular movement are  intact.  Sclerae clear.  Conjunctivae pink.  NECK:  Shows no jugular venous distension, no carotid bruits, no thyromegaly  or thyroid bruits.  LUNGS:  Clear to auscultation and percussion.  No rales, wheezes or rhonchi  appreciable.  HEART:  Regular rhythm, S1, S2 of normal intensity.  No S3 or S4 or gallops.  No heaves, thrills or rubs.  ABDOMEN:  Obese, soft, nontender.  Bowel sounds are normoactive.  No  guarding, rebound or masses.  No organomegaly.  EXTREMITIES:  No clubbing, cyanosis or edema.  NEUROLOGIC:  Cranial nerves II-XII are grossly intact.  The patient moves  all four extremities.   IMPRESSION:  1. Anginal-type chest pain.  2. Question of minimal to  moderate coronary disease, unsure at present.  3. Hypertension.  4. Hyperlipidemia.   PLAN:  1. Admit.  2. Continue all current medicines.  3. Add Lovenox 1 mg per kg subcu q.12h.  4. Add IV nitroglycerin and titrate to keep systolic over [id number] and chest pain     relief.  5. I will make further recommendations as the data base expands.  6. We will perform a Cardiolite scan before discharge.     ___________________________________________                                         Unk Lightning, MD   RMD/MEDQ  D:  08/16/2003  T:  08/16/2003  Job:  [id number]

## 2010-09-02 NOTE — Discharge Summary (Signed)
[name], DAMM NO.:  [id number]   MEDICAL RECORD NO.:  [id number]            PATIENT TYPE:   LOCATION:                                 FACILITY:   PHYSICIAN:  Ulice Dash R. Einar Gip, MD            DATE OF BIRTH:   DATE OF ADMISSION:  01/30/2007  DATE OF DISCHARGE:  02/01/2007                               DISCHARGE SUMMARY   DISCHARGE DIAGNOSES:  1. Noncardiac chest pain.  2. Noncritical coronary artery disease by cardiac catheterization this      admission.  3. Hypertension.  4. Dyslipidemia.   DISCHARGE MEDICATIONS:  1. Aspirin 325 mg 2  p.o. daily.  2. Caduet 20/20 mg p.o. daily.  3. Metoprolol succinate 100 mg p.o. daily.  4. Diovan HCT 160/12.5 mg p.o. daily.  5. Nitroglycerin sublingual p.r.n. as directed.  6. Protonix 40 mg p.o. daily.   HISTORY OF PRESENT ILLNESS:  For full details, please refer to the  dictated admission history and physical exam.  Briefly, Anthony Galvan is an  77 year old Caucasian male with history of nonobstructive coronary  artery disease by cardiac catheterization in August 2003 with subjective  history of prior intervention without records available.  He presented  to [facility] on date of admission with complaints of  substernal chest pain the workup confirmed sleep with radiation to the  left shoulder and arm associated with diaphoresis, nausea, and vomiting,  as well as shortness of breath.  He had initial cardiac enzymes that  were negative and no acute ischemic changes on EKG.  However, he was  then transferred to [facility] for further cardiac evaluation.   HOSPITAL COURSE:  The patient was received and transferred to [facility].  It was recommended that he undergo further ischemic  evaluation with cardiac catheterization.  This procedure including risks  and benefits were reviewed with this patient.  He expressed  understanding and desired to continue.  His laboratory values did  include serial cardiac  enzymes, and he did rule out for myocardial  infarction during his hospital course.  Cardiac catheterization was  completed on January 30, 2007 revealing 75% RCA lesion, 75% diagonal 1  lesion, 50% mid LAD, 50%-70% distal circumflex disease.  This was  completed by Dr. Gwenlyn Found.  Medical treatment was recommended for  noncritical coronary artery disease.  The patient was observed in the  hospital with medication adjustment, which include discharge medicine  list above.  He had no further chest pain and remained hemodynamically  stable and was deemed appropriate for discharge to home on February 01, 2008.   DISCHARGE INSTRUCTIONS:  The patient has been instructed to avoid tub  baths, pull to hot touch for over a week, avoid lifting greater than 5  pounds for 1 week.  He is to contact our office with any redness, fever,  draining, or  oozing from his catheterization site.  He will follow up with Dr. Einar Gip  in our office within the next few weeks.  He will return to emergency  department should he have any further chest pain or symptoms.   CONDITION ON DISCHARGE:  Stable.  Improved.      Geronimo Boot, PA      Ulice Dash R. Einar Gip, MD  Electronically Signed    ALT/MEDQ  D:  10/08/2007  T:  10/09/2007  Job:  [id number]   cc:   Eden Lathe. Einar Gip, MD

## 2010-09-02 NOTE — Cardiovascular Report (Signed)
NAME:  Anthony Galvan, Anthony Galvan                          ACCOUNT NO.:  [id number]   MEDICAL RECORD NO.:  [id number]                   PATIENT TYPE:  INP   LOCATION:  3731                                 FACILITY:  [place]   PHYSICIAN:  Eden Lathe. Einar Gip, M.D.                  DATE OF BIRTH:  10-Apr-1922   DATE OF PROCEDURE:  12/11/2001  DATE OF DISCHARGE:  12/13/2001                              CARDIAC CATHETERIZATION   PROCEDURES PERFORMED:  1. Left ventriculography.  2. Selective coronary arteriography.  3. Abdominal aortogram.  4. Right renal arteriography.   INDICATIONS:  The patient is a 77 year old gentleman with history of  previous coronary artery disease, hypertension and hypercholesterolemia, who  is admitted through the emergency room with chest pain  suggestive of angina  pectoris.  He has multiple cardiac risk factors.  He was brought to the  cardiac catheterization lab to evaluate for coronary artery disease.   HEMODYNAMIC DATA:  1. Left ventricular pressure:  127/9 mmHg.  2. Aortic pressure:  126/63 with mean of 87 mmHg.  There is no pressure gradient across the aortic valve.   ANGIOGRAPHIC DATA:   LEFT VENTRICULOGRAM:  The left ventricular systolic  function was normal.  Ejection fraction 60%.  There is no wall motion abnormality.   CORONARIES:  1. RIGHT CORONARY ARTERY:  A nondominant vessel.  It is very small.  2. LEFT MAIN CORONARY ARTERY:  A large vessel.  It is normal.  3. LEFT CIRCUMFLEX:  A large caliber vessel.  It is a dominant vessel that     gives origin to a very large obtuse marginal one and continues as a     obtuse marginal two, and gives origin to several PDA branches.  It has     mild, diffuse disease.  4. LEFT ANTERIOR DESCENDING ARTERY:  A large caliber vessel.  It has a mid     10-20% stenosis.  It gives origin to a moderate sized diagonal one, a     moderate sized diagonal two vessel.  The diagonal one has proximal 20-30%     stenosis.  5. RAMUS  INTERMEDIUS:  The ramus branch has mild diffuse disease.   ABDOMINAL AORTOGRAM:  Has mild prosthetic changes to the abdominal aorta,  with evidence of two renal arteries on either side.  There was appearance of  the right renal artery stenosis.   RENAL ARTERIOGRAPHY:  Revealed widely patent renal artery with mild  atherosclerotic changes.  Left renal artery, visualized through the  abdominal aorta, was normal.   IMPRESSION:  1. Normal left ventricular systolic function.  Ejection fraction 60%.  2. Insignificant coronary artery disease.  Constituting 10-20% mid LAD and     diagonal one disease, and mild, diffuse disease of circumflex coronary     artery.  3. Left dominant circulation with circumflex artery giving origin to PDA     branches.  RECOMMENDATIONS:  Risk factor modification is indicated.  Evaluation for  noncardiac cause of chest pain, including gastroesophageal reflux disease,  may be indicated.   TECHNIQUE OF PROCEDURE:  Under usual sterile precautions, using 6-French  right femoral artery access.  A 6-French multipurpose B2 catheter was  advanced into the ascending aorta over the 0.035 inch J-wire.  The catheter  was gently advanced to the left ventricle.  The left ventricle appears  ____________.  The hand contrast injection of the left ventricle is  performed, both in the LAO and RAO projections.  The catheter was flushed  with saline and pulled back into the ascending aorta.  Pressure gradient  across the aortic valve was monitored.  The right coronary artery was  selectively engaged and angiography was performed.  Then the catheter was  manipulated over the edge of the left main coronary artery, and angiography  was performed.  Then the catheter was pulled back into the abdominal aorta,  and abdominal aortogram performed.  Then selective right renal arteriography  was performed.  Then the catheter was pulled out of the body.  The patient  was transferred to the  recovery room in stable condition.  The patient  tolerated the procedure well.                                               Eden Lathe. Einar Gip, M.D.    Minna Antis  D:  12/12/2001  T:  12/14/2001  Job:  [id number]   cc:   Barrie Folk. Berdine Addison, M.D.

## 2010-09-05 NOTE — Progress Notes (Signed)
Pt needs a H. Pylori stool Ag off PPI for 2 weeks.

## 2010-09-07 NOTE — Progress Notes (Signed)
Please see addendum by Dr. Oneida Alar. Please ask pt to hold PPI for 2 weeks, obtain stool sample for H.pylori and submit to our office, then resume PPI. Thanks

## 2010-09-08 ENCOUNTER — Other Ambulatory Visit: Payer: Self-pay

## 2010-09-08 DIAGNOSIS — R110 Nausea: Secondary | ICD-10-CM

## 2010-09-08 NOTE — Progress Notes (Signed)
LMOM for pt to call. Stool order and container at front for pick-up.

## 2010-09-08 NOTE — Progress Notes (Signed)
Pt returned call and was informed. He will come by tomorrow for container and lab order for the stool study.

## 2010-09-13 ENCOUNTER — Telehealth: Payer: Self-pay

## 2010-09-13 NOTE — Telephone Encounter (Signed)
Pt called and said he had taken the stool specimen for the H. Pylori to the lab. I told him he was not supposed to til 09/23/2010. He is not taking the Protonix.. I told him to stay off of the Protonix and do the test again on 09/23/2010. He said he would. Another container and order left at front for pt. I also called lab and asked them to discard the stool sample today (they had over looked the date it was due). I spoke with Dorian Pod.

## 2010-09-22 ENCOUNTER — Other Ambulatory Visit: Payer: Self-pay | Admitting: Gastroenterology

## 2010-09-24 LAB — HELICOBACTER PYLORI  SPECIAL ANTIGEN: H. PYLORI Antigen: NEGATIVE

## 2010-11-23 ENCOUNTER — Encounter: Payer: Self-pay | Admitting: Gastroenterology

## 2010-11-24 ENCOUNTER — Ambulatory Visit: Payer: Medicare Other | Admitting: Gastroenterology

## 2010-11-24 ENCOUNTER — Telehealth: Payer: Self-pay | Admitting: Gastroenterology

## 2010-11-24 NOTE — Telephone Encounter (Signed)
NOTED

## 2010-12-14 ENCOUNTER — Ambulatory Visit (INDEPENDENT_AMBULATORY_CARE_PROVIDER_SITE_OTHER): Payer: Medicare Other | Admitting: Gastroenterology

## 2010-12-14 VITALS — BP 160/70 | HR 66 | Temp 98.2°F | Ht 63.0 in | Wt 180.2 lb

## 2010-12-14 DIAGNOSIS — K2970 Gastritis, unspecified, without bleeding: Secondary | ICD-10-CM

## 2010-12-14 MED ORDER — PANTOPRAZOLE SODIUM 40 MG PO TBEC: 40.0000 mg | DELAYED_RELEASE_TABLET | Freq: Every day | ORAL | Status: DC

## 2010-12-15 ENCOUNTER — Encounter: Payer: Self-pay | Admitting: Gastroenterology

## 2010-12-15 DIAGNOSIS — K2970 Gastritis, unspecified, without bleeding: Secondary | ICD-10-CM | POA: Insufficient documentation

## 2010-12-15 NOTE — Progress Notes (Signed)
  Subjective:    Patient ID: Anthony Galvan, male    DOB: 11-16-1921, 77 y.o.   MRN: [id number]  PCP: [facility]  HPI Pt seen May 2012 c/o nausea, off PPI on ASA. Protonix restarted once daily.Pt's Sx have resolved. LOVES TO LAUGH AND JOKE AROUND. NOT GOLFING BECAUSE HIS BROTHER IS SICK.  Past Medical History  Diagnosis Date  . Duodenal ulcer May 2011    on EGD  . Helicobacter pylori gastritis MAY 2012 HP STOOL AG NEG  . GERD (gastroesophageal reflux disease)   . CAD (coronary artery disease)   . MI (myocardial infarction)   . Hyperlipidemia   . HTN (hypertension)   . Osteoarthritis   . S/P colonoscopy 2011    normal, internal hemorrhoids    Past Surgical History  Procedure Date  . Cardiac cath with stents   . Cataracts   . Colonoscopy OCT 2011 ARS    SML IH  . Upper gastrointestinal endoscopy MAY 2011 MELENA, HEMATEMESIS    DUODENAL ULCER, Bx: H PYLORI POS    No Known Allergies  Current Outpatient Prescriptions  Medication Sig Dispense Refill  . aspirin 81 MG tablet Take 81 mg by mouth daily.        . hydrALAZINE (APRESOLINE) 10 MG tablet Take 10 mg by mouth 3 (three) times daily.      Marland Kitchen loratadine (CLARITIN) 10 MG tablet Take 10 mg by mouth daily.      . metoprolol (TOPROL-XL) 100 MG 24 hr tablet Take 100 mg by mouth daily.      . valsartan (DIOVAN) 320 MG tablet Take 320 mg by mouth daily.      . pantoprazole (PROTONIX) 40 MG tablet Take 1 tablet (40 mg total) by mouth daily.       Review of Systems     Objective:   Physical Exam  Vitals reviewed. Constitutional: He is oriented to person, place, and time. He appears well-developed and well-nourished.  HENT:  Head: Normocephalic and atraumatic.  Cardiovascular: Normal rate, regular rhythm and normal heart sounds.   Pulmonary/Chest: Effort normal and breath sounds normal.  Abdominal: Soft. Bowel sounds are normal. He exhibits no distension. There is no tenderness.  Neurological: He is alert and oriented to person,  place, and time.       NO FOCAL DEFICITS          Assessment & Plan:

## 2010-12-15 NOTE — Assessment & Plan Note (Signed)
Completed Rx FOR H.PYLORI. NAUSEA LIKELY 2O TO AS induced gastritis. Sx resolved with PPI.  Pt needs qd PPI forever. OPV in 12 mos.

## 2010-12-15 NOTE — Progress Notes (Signed)
Addended by: Danie Binder on: 12/15/2010 05:07 PM   Modules accepted: Level of Service

## 2010-12-20 NOTE — Progress Notes (Signed)
Cc to PCP 

## 2010-12-20 NOTE — Progress Notes (Signed)
Reminder in epic to follow up in one year with [facility]

## 2011-01-25 LAB — BASIC METABOLIC PANEL
BUN: 21
CO2: 26
CO2: 27
Chloride: 105
GFR calc Af Amer: >60
GFR calc non Af Amer: 47 — ABNORMAL LOW
Glucose, Bld: 93
Potassium: 3.6
Potassium: 4.3

## 2011-01-25 LAB — LIPID PANEL
HDL: 45
Triglycerides: 81
VLDL: 16

## 2011-01-25 LAB — B-NATRIURETIC PEPTIDE (CONVERTED LAB): Pro B Natriuretic peptide (BNP): <30

## 2011-01-25 LAB — POCT CARDIAC MARKERS
CKMB, poc: <1 — ABNORMAL LOW
Troponin i, poc: <0.05

## 2011-01-25 LAB — DIFFERENTIAL
Basophils Absolute: 0.1
Basophils Relative: 1
Lymphocytes Relative: 26
Monocytes Absolute: 0.8 — ABNORMAL HIGH
Monocytes Relative: 8
Neutro Abs: 6.3
Neutrophils Relative %: 63

## 2011-01-25 LAB — CBC
HCT: 35.3 — ABNORMAL LOW
HCT: 37.4 — ABNORMAL LOW
Hemoglobin: 11.8 — ABNORMAL LOW
MCHC: 33.3
MCHC: 33.4
MCHC: 33.5
MCV: 86.5
MCV: 87.7
Platelets: 140 — ABNORMAL LOW
Platelets: 186
RBC: 4.03 — ABNORMAL LOW
RDW: 11.8
RDW: 12.2
WBC: 8.9

## 2011-01-25 LAB — APTT
aPTT: 33
aPTT: >200

## 2011-01-25 LAB — COMPREHENSIVE METABOLIC PANEL
Albumin: 3.5
BUN: 17
Creatinine, Ser: 1.3
Glucose, Bld: 98
Total Protein: 6.7

## 2011-01-25 LAB — TSH: TSH: 3.113

## 2011-01-25 LAB — CK TOTAL AND CKMB (NOT AT ARMC)
CK, MB: 0.9
CK, MB: 1
CK, MB: 1
CK, MB: 1.1
Relative Index: INVALID
Relative Index: INVALID
Total CK: 88

## 2011-01-25 LAB — HEMOGLOBIN A1C: Mean Plasma Glucose: 104

## 2011-01-25 LAB — PROTIME-INR
INR: 1
INR: 1.1
Prothrombin Time: 13.7
Prothrombin Time: 14.2

## 2011-01-25 LAB — HEPARIN LEVEL (UNFRACTIONATED): Heparin Unfractionated: 1.25 — ABNORMAL HIGH

## 2011-11-28 ENCOUNTER — Ambulatory Visit (INDEPENDENT_AMBULATORY_CARE_PROVIDER_SITE_OTHER): Payer: Medicare Other | Admitting: Gastroenterology

## 2011-11-28 ENCOUNTER — Encounter: Payer: Self-pay | Admitting: Gastroenterology

## 2011-11-28 VITALS — BP 165/76 | HR 68 | Temp 97.6°F | Ht 64.0 in | Wt 173.8 lb

## 2011-11-28 DIAGNOSIS — K2970 Gastritis, unspecified, without bleeding: Secondary | ICD-10-CM

## 2011-11-28 DIAGNOSIS — R110 Nausea: Secondary | ICD-10-CM

## 2011-11-28 MED ORDER — ALIGN 4 MG PO CAPS: 4.0000 mg | ORAL_CAPSULE | Freq: Every day | ORAL | Status: DC

## 2011-11-28 NOTE — Progress Notes (Signed)
Primary Care Physician: Maggie Font, MD  Primary Gastroenterologist:  Barney Drain, MD   Chief Complaint  Patient presents with  . Follow-up    HPI: Anthony Galvan is a 78 y.o. male here for f/u. Last seen in 11/2010 by Dr. Oneida Alar. H/O duodenal ulcer/H.Pylori gastritis 08/2009. Negative H.Pylori stool antigen in 08/2010.   Overall feels well. Stomach growls a lot and real gassy. Sore in sides at time/bloated. Associated with nausea but no vomiting. BM regular, no melena, brbpr. Appetite good. No dysphagia, heartburn. Weight down 13 pounds since 08/2010, intentional. Patient reports was down another ten pounds but gained it back.  Takes ASA daily but no other NSAIDS.   Report BP up today b/c running late and did not take BP medication.  Current Outpatient Prescriptions  Medication Sig Dispense Refill  . aspirin 81 MG tablet Take 81 mg by mouth daily.        . hydrALAZINE (APRESOLINE) 10 MG tablet Take 10 mg by mouth 3 (three) times daily.        Marland Kitchen loratadine (CLARITIN) 10 MG tablet Take 10 mg by mouth daily.        . metoprolol (TOPROL-XL) 100 MG 24 hr tablet Take 100 mg by mouth daily.        . pantoprazole (PROTONIX) 40 MG tablet Take 1 tablet (40 mg total) by mouth daily.  30 tablet  11  . valsartan-hydrochlorothiazide (DIOVAN-HCT) 320-12.5 MG per tablet Take 1 tablet by mouth daily.      . pantoprazole (PROTONIX) 40 MG tablet Take 1 tablet (40 mg total) by mouth daily. Take once daily, 30 minutes before breakfast.  30 tablet  3  . valsartan (DIOVAN) 320 MG tablet Take 320 mg by mouth daily.          Allergies as of 11/28/2011  . (No Known Allergies)    ROS:  General: Negative for anorexia, weight loss, fever, chills, fatigue, weakness. ENT: Negative for hoarseness, difficulty swallowing , nasal congestion. CV: Negative for chest pain, angina, palpitations, dyspnea on exertion, peripheral edema.  Respiratory: Negative for dyspnea at rest, dyspnea on exertion, cough, sputum,  wheezing.  GI: See history of present illness. GU:  Negative for dysuria, hematuria, urinary incontinence, urinary frequency, nocturnal urination.  Endo: Negative for unusual weight change.    Physical Examination:   BP 165/76  Pulse 68  Temp 97.6 F (36.4 C) (Temporal)  Ht 5\' 4"  (1.626 m)  Wt 173 lb 12.8 oz (78.835 kg)  BMI 29.83 kg/m2  General: Well-nourished, well-developed in no acute distress.  Eyes: No icterus. Mouth: Oropharyngeal mucosa moist and pink , no lesions erythema or exudate. Lungs: Clear to auscultation bilaterally.  Heart: Regular rate and rhythm, no murmurs rubs or gallops.  Abdomen: Bowel sounds are normal, nontender, nondistended, no hepatosplenomegaly or masses, no abdominal bruits or hernia , no rebound or guarding.   Extremities: No lower extremity edema. No clubbing or deformities. Neuro: Alert and oriented x 4   Skin: Warm and dry, no jaundice.   Psych: Alert and cooperative, normal mood and affect.

## 2011-11-28 NOTE — Progress Notes (Signed)
Faxed to PCP

## 2011-11-28 NOTE — Assessment & Plan Note (Signed)
Nausea associated with abdominal "grumbling" and gas. Occurs frequently. No abdominal pain. No vomiting, heartburn, unintentional weight loss, blood in stools. ?ongoing gastritis due to ASA? Try probiotics.   Take Align one daily for four weeks to help aid in better digestion. If your abdominal grumbling and gas do not improve, please call. Continue protonix forever. Office visit with Dr. Oneida Alar in one year.

## 2011-11-28 NOTE — Patient Instructions (Signed)
Take Align one daily for four weeks to help aid in better digestion. If your abdominal grumbling and gas do not improve, please call. Continue protonix forever. Office visit with Dr. Oneida Alar in one year.

## 2011-11-28 NOTE — Progress Notes (Signed)
Reminder in epic to follow up in one year with SF ONLY in E30

## 2012-01-04 ENCOUNTER — Other Ambulatory Visit: Payer: Self-pay

## 2012-01-05 MED ORDER — PANTOPRAZOLE SODIUM 40 MG PO TBEC: 40.0000 mg | DELAYED_RELEASE_TABLET | Freq: Every day | ORAL | Status: DC

## 2012-07-30 ENCOUNTER — Encounter (HOSPITAL_COMMUNITY): Payer: Self-pay | Admitting: Emergency Medicine

## 2012-07-30 ENCOUNTER — Inpatient Hospital Stay (HOSPITAL_COMMUNITY)
Admission: EM | Admit: 2012-07-30 | Discharge: 2012-08-02 | DRG: 249 | Disposition: A | Discharge status: Home / Self Care | Payer: Medicare Other | Attending: Cardiology | Admitting: Cardiology

## 2012-07-30 ENCOUNTER — Emergency Department (HOSPITAL_COMMUNITY): Payer: Medicare Other

## 2012-07-30 DIAGNOSIS — R079 Chest pain, unspecified: Secondary | ICD-10-CM

## 2012-07-30 DIAGNOSIS — H269 Unspecified cataract: Secondary | ICD-10-CM

## 2012-07-30 DIAGNOSIS — I517 Cardiomegaly: Secondary | ICD-10-CM

## 2012-07-30 DIAGNOSIS — I200 Unstable angina: Secondary | ICD-10-CM | POA: Diagnosis present

## 2012-07-30 DIAGNOSIS — Z79899 Other long term (current) drug therapy: Secondary | ICD-10-CM

## 2012-07-30 DIAGNOSIS — K269 Duodenal ulcer, unspecified as acute or chronic, without hemorrhage or perforation: Secondary | ICD-10-CM | POA: Diagnosis present

## 2012-07-30 DIAGNOSIS — Z955 Presence of coronary angioplasty implant and graft: Secondary | ICD-10-CM

## 2012-07-30 DIAGNOSIS — J438 Other emphysema: Secondary | ICD-10-CM | POA: Diagnosis present

## 2012-07-30 DIAGNOSIS — E785 Hyperlipidemia, unspecified: Secondary | ICD-10-CM | POA: Diagnosis present

## 2012-07-30 DIAGNOSIS — I2510 Atherosclerotic heart disease of native coronary artery without angina pectoris: Secondary | ICD-10-CM

## 2012-07-30 DIAGNOSIS — I252 Old myocardial infarction: Secondary | ICD-10-CM

## 2012-07-30 DIAGNOSIS — Z7902 Long term (current) use of antithrombotics/antiplatelets: Secondary | ICD-10-CM

## 2012-07-30 DIAGNOSIS — J9819 Other pulmonary collapse: Secondary | ICD-10-CM | POA: Diagnosis present

## 2012-07-30 DIAGNOSIS — K219 Gastro-esophageal reflux disease without esophagitis: Secondary | ICD-10-CM

## 2012-07-30 DIAGNOSIS — Z7982 Long term (current) use of aspirin: Secondary | ICD-10-CM

## 2012-07-30 DIAGNOSIS — I10 Essential (primary) hypertension: Secondary | ICD-10-CM

## 2012-07-30 DIAGNOSIS — M1990 Unspecified osteoarthritis, unspecified site: Secondary | ICD-10-CM | POA: Diagnosis present

## 2012-07-30 DIAGNOSIS — Z23 Encounter for immunization: Secondary | ICD-10-CM

## 2012-07-30 LAB — TROPONIN I
Troponin I: <0.3 ng/mL (ref <0.30)
Troponin I: <0.3 ng/mL (ref <0.30)
Troponin I: <0.3 ng/mL (ref <0.30)
Troponin I: <0.3 ng/mL (ref <0.30)

## 2012-07-30 LAB — CBC WITH DIFFERENTIAL/PLATELET
Basophils Absolute: 0 10*3/uL (ref 0.0–0.1)
Basophils Relative: 1 % (ref 0–1)
Eosinophils Absolute: 0.3 10*3/uL (ref 0.0–0.7)
Eosinophils Relative: 4 % (ref 0–5)
HCT: 40.9 % (ref 39.0–52.0)
Lymphocytes Relative: 35 % (ref 12–46)
MCH: 28.8 pg (ref 26.0–34.0)
MCHC: 32.3 g/dL (ref 30.0–36.0)
MCV: 89.1 fL (ref 78.0–100.0)
Monocytes Absolute: 0.5 10*3/uL (ref 0.1–1.0)
RDW: 11.7 % (ref 11.5–15.5)

## 2012-07-30 LAB — BASIC METABOLIC PANEL
Calcium: 9.2 mg/dL (ref 8.4–10.5)
Creatinine, Ser: 1.29 mg/dL (ref 0.50–1.35)
GFR calc Af Amer: 55 mL/min — ABNORMAL LOW (ref >90)

## 2012-07-30 LAB — LIPID PANEL
HDL: 73 mg/dL (ref >39)
LDL Cholesterol: 125 mg/dL — ABNORMAL HIGH (ref 0–99)

## 2012-07-30 MED ORDER — ONDANSETRON HCL 4 MG/2ML IJ SOLN
INTRAMUSCULAR | Status: AC
  Administered 2012-07-30: 4 mg
  Filled 2012-07-30: qty 2

## 2012-07-30 MED ORDER — NITROGLYCERIN 2 % TD OINT
1.0000 [in_us] | TOPICAL_OINTMENT | Freq: Once | TRANSDERMAL | Status: AC
  Administered 2012-07-30: 1 [in_us] via TOPICAL
  Filled 2012-07-30: qty 1

## 2012-07-30 MED ORDER — ONDANSETRON HCL 4 MG/2ML IJ SOLN: 4.0000 mg | Freq: Four times a day (QID) | INTRAMUSCULAR | Status: DC | PRN

## 2012-07-30 MED ORDER — PANTOPRAZOLE SODIUM 40 MG PO TBEC
40.0000 mg | DELAYED_RELEASE_TABLET | Freq: Every day | ORAL | Status: DC
  Administered 2012-07-31 – 2012-08-02 (×3): 40 mg via ORAL
  Filled 2012-07-30 (×3): qty 1

## 2012-07-30 MED ORDER — VALSARTAN-HYDROCHLOROTHIAZIDE 320-12.5 MG PO TABS: 1.0000 | ORAL_TABLET | Freq: Every day | ORAL | Status: DC

## 2012-07-30 MED ORDER — HYDRALAZINE HCL 10 MG PO TABS
20.0000 mg | ORAL_TABLET | Freq: Two times a day (BID) | ORAL | Status: DC
  Administered 2012-07-30 – 2012-08-02 (×6): 20 mg via ORAL
  Filled 2012-07-30 (×7): qty 2

## 2012-07-30 MED ORDER — IRBESARTAN 300 MG PO TABS
300.0000 mg | ORAL_TABLET | Freq: Every day | ORAL | Status: DC
  Administered 2012-07-31 – 2012-08-02 (×3): 300 mg via ORAL
  Filled 2012-07-30 (×3): qty 1

## 2012-07-30 MED ORDER — ENOXAPARIN SODIUM 40 MG/0.4ML ~~LOC~~ SOLN
40.0000 mg | SUBCUTANEOUS | Status: DC
  Filled 2012-07-30 (×2): qty 0.4

## 2012-07-30 MED ORDER — ACETAMINOPHEN 650 MG RE SUPP: 650.0000 mg | Freq: Four times a day (QID) | RECTAL | Status: DC | PRN

## 2012-07-30 MED ORDER — ASPIRIN EC 81 MG PO TBEC
81.0000 mg | DELAYED_RELEASE_TABLET | Freq: Every day | ORAL | Status: DC
  Administered 2012-07-31: 81 mg via ORAL
  Filled 2012-07-30 (×2): qty 1

## 2012-07-30 MED ORDER — METOPROLOL SUCCINATE ER 100 MG PO TB24
100.0000 mg | ORAL_TABLET | Freq: Every day | ORAL | Status: DC
  Administered 2012-07-31 – 2012-08-02 (×3): 100 mg via ORAL
  Filled 2012-07-30 (×3): qty 1

## 2012-07-30 MED ORDER — NITROGLYCERIN 0.4 MG SL SUBL: 0.4000 mg | SUBLINGUAL_TABLET | SUBLINGUAL | Status: DC | PRN

## 2012-07-30 MED ORDER — HYDROCHLOROTHIAZIDE 12.5 MG PO CAPS
12.5000 mg | ORAL_CAPSULE | Freq: Every day | ORAL | Status: DC
  Administered 2012-07-31 – 2012-08-02 (×3): 12.5 mg via ORAL
  Filled 2012-07-30 (×3): qty 1

## 2012-07-30 MED ORDER — ACETAMINOPHEN 325 MG PO TABS
650.0000 mg | ORAL_TABLET | Freq: Four times a day (QID) | ORAL | Status: DC | PRN
  Administered 2012-07-31: 650 mg via ORAL
  Filled 2012-07-30: qty 2

## 2012-07-30 MED ORDER — MORPHINE SULFATE 2 MG/ML IJ SOLN
2.0000 mg | Freq: Once | INTRAMUSCULAR | Status: AC
  Administered 2012-07-30: 2 mg via INTRAVENOUS
  Filled 2012-07-30: qty 1

## 2012-07-30 MED ORDER — SODIUM CHLORIDE 0.9 % IJ SOLN
3.0000 mL | Freq: Two times a day (BID) | INTRAMUSCULAR | Status: DC
  Administered 2012-07-30 – 2012-08-01 (×4): 3 mL via INTRAVENOUS

## 2012-07-30 MED ORDER — ALUM & MAG HYDROXIDE-SIMETH 200-200-20 MG/5ML PO SUSP: 30.0000 mL | Freq: Four times a day (QID) | ORAL | Status: DC | PRN

## 2012-07-30 MED ORDER — ONDANSETRON HCL 4 MG PO TABS: 4.0000 mg | ORAL_TABLET | Freq: Four times a day (QID) | ORAL | Status: DC | PRN

## 2012-07-30 MED ORDER — MORPHINE SULFATE 2 MG/ML IJ SOLN: 2.0000 mg | INTRAMUSCULAR | Status: DC | PRN

## 2012-07-30 NOTE — ED Notes (Signed)
EMS gave 324 mg ASA and 1 SL nitro. Patient states no change in pain from nitro.

## 2012-07-30 NOTE — ED Notes (Signed)
Report given to Marcello Moores, RN unit 300. Ready to receive patient.

## 2012-07-30 NOTE — H&P (Signed)
Patient seen, examined and discussed with minus practitioner. Patient seen by myself after he arrived to the floor. Doing well. Now chest pain free currently. No complaints. BNP normal. However with EKG findings will rule out with enzymes and await cardiac evaluation and recommendations. Echocardiogram ordered, likely will be done tomorrow. Anticipate discharge tomorrow.

## 2012-07-30 NOTE — ED Provider Notes (Signed)
History    This chart was scribed for Anthony Diego, MD by Anthony Galvan, ED scribe.  This patient was seen in room APA15/APA15 and the patient's care was started at 8:17 AM.   CSN: [id number]  Arrival date & time 07/30/12  0758      Chief Complaint  Patient presents with  . Chest Pain     Patient is a 79 y.o. male presenting with chest pain. The history is provided by the patient. No language interpreter was used.  Chest Pain Pain location:  Unable to specify Pain quality: sharp   Pain radiates to:  Does not radiate Pain radiates to the back: no   Pain severity:  Moderate Onset quality:  Sudden Duration:  2 hours Timing:  Intermittent Progression:  Partially resolved Chronicity:  Recurrent Relieved by:  Nothing Worsened by:  Exertion Associated symptoms: diaphoresis and shortness of breath   Associated symptoms: no abdominal pain, no back pain, no cough, no fatigue and no headache   Risk factors: coronary artery disease, high cholesterol, hypertension and male sex    Anthony Galvan is a 79 y.o. male with a history of MI who presents to the Emergency Department complaining of sudden-onset moderate chest pain described as "sharp" that began today, accompanied by diaphoresis and SOB.  Pt had a massive heart attack in 1980, and states he experienced similar symptoms.   Pain is exacerbated by ambulation.   Pt was given nitroglycerin today which helped slightly.  He abdominal pain.  Currently he states he has pain but it is less severe..   Cardiologist is at [facility]. PCP is Dr. Berdine Galvan.   Past Medical History  Diagnosis Date  . Duodenal ulcer May 2011    on EGD  . Helicobacter pylori gastritis MAY 2012 HP STOOL AG NEG  . GERD (gastroesophageal reflux disease)   . CAD (coronary artery disease)   . MI (myocardial infarction)   . Hyperlipidemia   . HTN (hypertension)   . Osteoarthritis   . S/P colonoscopy 2011    normal, internal hemorrhoids    Past  Surgical History  Procedure Laterality Date  . Cardiac cath with stents    . Cataracts    . Colonoscopy  OCT 2011 ARS    SML IH  . Upper gastrointestinal endoscopy  MAY 2011 MELENA, HEMATEMESIS    DUODENAL ULCER, Bx: H PYLORI POS    Family History  Problem Relation Age of Onset  . Colon cancer Neg Hx     History  Substance Use Topics  . Smoking status: Never Smoker   . Smokeless tobacco: Not on file  . Alcohol Use: No      Review of Systems  Constitutional: Positive for diaphoresis. Negative for appetite change and fatigue.  HENT: Negative for congestion, sinus pressure and ear discharge.   Eyes: Negative for discharge.  Respiratory: Positive for shortness of breath. Negative for cough.   Cardiovascular: Positive for chest pain.  Gastrointestinal: Negative for abdominal pain and diarrhea.  Genitourinary: Negative for frequency and hematuria.  Musculoskeletal: Negative for back pain.  Skin: Negative for rash.  Neurological: Negative for seizures and headaches.  Psychiatric/Behavioral: Negative for hallucinations.      Allergies  Review of patient's allergies indicates no known allergies.  Home Medications   Current Outpatient Rx  Name  Route  Sig  Dispense  Refill  . aspirin 81 MG tablet   Oral   Take 81 mg by mouth daily.           Marland Kitchen  hydrALAZINE (APRESOLINE) 10 MG tablet   Oral   Take 10 mg by mouth 3 (three) times daily.           Marland Kitchen loratadine (CLARITIN) 10 MG tablet   Oral   Take 10 mg by mouth daily.           . metoprolol (TOPROL-XL) 100 MG 24 hr tablet   Oral   Take 100 mg by mouth daily.           . pantoprazole (PROTONIX) 40 MG tablet   Oral   Take 1 tablet (40 mg total) by mouth daily.   30 tablet   11   . Probiotic Product (ALIGN) 4 MG CAPS   Oral   Take 4 mg by mouth daily.   30 capsule   0   . valsartan (DIOVAN) 320 MG tablet   Oral   Take 320 mg by mouth daily.           . valsartan-hydrochlorothiazide (DIOVAN-HCT)  320-12.5 MG per tablet   Oral   Take 1 tablet by mouth daily.           BP 157/87  Pulse 88  Temp(Src) 98.5 F (36.9 C) (Oral)  Ht 5\' 4"  (1.626 m)  Wt 180 lb (81.647 kg)  BMI 30.88 kg/m2  SpO2 96%  Physical Exam  Nursing note and vitals reviewed. Constitutional: He is oriented to person, place, and time. He appears well-developed.  HENT:  Head: Normocephalic.  Eyes: Conjunctivae and EOM are normal. No scleral icterus.  Neck: Neck supple. No thyromegaly present.  Cardiovascular: Normal rate, regular rhythm and normal heart sounds.  Exam reveals no gallop and no friction rub.   No murmur heard. Pulmonary/Chest: No stridor. He has no wheezes. He has no rales. He exhibits no tenderness.  Abdominal: He exhibits no distension. There is no tenderness. There is no rebound.  Musculoskeletal: Normal range of motion. He exhibits no edema.  Lymphadenopathy:    He has no cervical adenopathy.  Neurological: He is oriented to person, place, and time. Coordination normal.  Skin: No rash noted. No erythema.  Psychiatric: He has a normal mood and affect. His behavior is normal.      ED Course  Procedures (including critical care time)  DIAGNOSTIC STUDIES: Oxygen Saturation is 96% on [place], normal by my interpretation.    COORDINATION OF CARE: 8:21 AM-Discussed treatment plan which includes labs and imaging with pt at bedside and pt agreed to plan.   11:30 AM: On recheck, pt states that pain is no longer present but that chest feels "heavy."  He also notes that he had an intestinal ulcer several year ago.  Pt denies melena.  Discussed treatment including consult with other physicians and possible hospital admission.  Pt agrees to plan.    Medications  nitroGLYCERIN (NITROGLYN) 2 % ointment 1 inch (1 inch Topical Given 07/30/12 0833)  morphine 2 MG/ML injection 2 mg (2 mg Intravenous Given 07/30/12 0835)  ondansetron (ZOFRAN) 4 MG/2ML injection (4 mg  Given 07/30/12 0849)      Labs  Reviewed  CBC WITH DIFFERENTIAL - Abnormal; Notable for the following:    Platelets 119 (*)    All other components within normal limits  BASIC METABOLIC PANEL - Abnormal; Notable for the following:    GFR calc non Af Amer 48 (*)    GFR calc Af Amer 55 (*)    All other components within normal limits  TROPONIN I  TROPONIN I  Dg Chest Portable 1 View  07/30/2012  *RADIOLOGY REPORT*  Clinical Data: Chest pain.  Prior history of MI.  PORTABLE CHEST - 1 VIEW 07/30/2012 0805 hours:  Comparison: Portable chest x-ray 01/30/2007, 08/16/2003, 07/12/2003.  Findings: Cardiac silhouette mildly enlarged for AP portable technique, unchanged.  Pulmonary vascularity normal without evidence pulmonary edema.  Streaky opacity in the lung bases and in the inferior right upper lobe.  Emphysematous changes suspected in the upper lobes.  IMPRESSION: Mild cardiomegaly without pulmonary edema.  Probable COPD/emphysema.  Atelectasis in the lung bases and in the inferior right upper lobe.   Original Report Authenticated By: Evangeline Dakin, M.D.      No diagnosis found.   Date: 07/30/2012  Rate:86  Rhythm: normal sinus rhythm  QRS Axis:left  Intervals: normal  ST/T Wave abnormalities: normal  Conduction Disutrbances:none  Narrative Interpretation:   Old EKG Reviewed: unchanged    MDM        The chart was scribed for me under my direct supervision.  I personally performed the history, physical, and medical decision making and all procedures in the evaluation of this patient.Anthony Diego, MD 07/30/12 [phone]

## 2012-07-30 NOTE — ED Notes (Signed)
Beeped to [id number].[place]

## 2012-07-30 NOTE — ED Notes (Signed)
Patient arrives via EMS from home with c/o chest pain that started while getting into his vehicle. Patient states pain is pressure and tight. Cardiac history. Reports some shortness of breath.

## 2012-07-30 NOTE — H&P (Signed)
[facility] History and Physical  [name] [id number] DOB: 07-Dec-1921 DOA: 07/30/2012  Referring physician:  PCP: Maggie Font, MD  Specialists:   Chief Complaint: chest pain  HPI: Anthony Galvan is a delightful 79 y.o. male with past medical hx that includes HTN, MI 1980, CAD, GERD, duodenal ulcer, hyperlipidemia who presents to ED via EMS cc chest pain. Information obtained from patient. He states he was walking from house to car this morning and suddenly developed a sharp pain located central chest that radiated to neck. States he sat in car for a minute and pain lessened slightly so he walked back to house and pain intensified. He called EMS. Associated symptoms include diaphoresis and sob. Denies palpitation, dizziness, abdominal pain. He denies nausea until he arrived at hospital. He states he took aspirin before EMS arrived and EMS gave him more aspirin. States initially pain 10/10 and decreased to 7/10 once EMS arrived. On my exam he denied pain but stated "just a little tightness" . He has long hx CAD with most recent cath 2004. He does not see cardiologist regularly. He denies lower extremity swelling but sates he sleeps with 2 pillows as he "breathes better with 2". Symptoms came on suddenly persisted and characterized as severe. Work up in ED includes chest xray yielding mild cardiomegaly without pulmonary edema, probable COPD and atelectasis lung bases and inferior right upper lobe, EKG with NSR, incomplete RBBB and LVH. Initial troponin neg. And proBNP 76.   Review of Systems: The patient denies anorexia, fever,  vision loss, decreased hearing, hoarseness,  syncope,  peripheral edema, balance deficits, hemoptysis, abdominal pain, melena, hematochezia, severe indigestion/heartburn, hematuria, incontinence, genital sores, muscle weakness, suspicious skin lesions, transient blindness, difficulty walking, depression, unusual weight change, abnormal bleeding, enlarged lymph  nodes, angioedema, and breast masses.    Past Medical History  Diagnosis Date  . Duodenal ulcer May 2011    on EGD  . Helicobacter pylori gastritis MAY 2012 HP STOOL AG NEG  . GERD (gastroesophageal reflux disease)   . CAD (coronary artery disease)   . MI (myocardial infarction)   . Hyperlipidemia   . HTN (hypertension)   . Osteoarthritis   . S/P colonoscopy 2011    normal, internal hemorrhoids   Past Surgical History  Procedure Laterality Date  . Cardiac cath with stents    . Cataracts    . Colonoscopy  OCT 2011 ARS    SML IH  . Upper gastrointestinal endoscopy  MAY 2011 MELENA, HEMATEMESIS    DUODENAL ULCER, Bx: H PYLORI POS   Social History:  reports that he has never smoked. He does not have any smokeless tobacco history on file. He reports that he does not drink alcohol or use illicit drugs. Pt lives with grandson. Is independent with ADL's. 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 Widower for 25 years  No Known Allergies  Family History  Problem Relation Age of Onset  . Colon cancer Neg Hx    Mother deceased in her 25's of stroke. Had hx CAD, MI, HTN. Father deceased colon,prostate cancer. 8 siblings all with CAD, HTN  Prior to Admission medications   Medication Sig Start Date End Date Taking? Authorizing Provider  aspirin 81 MG tablet Take 81 mg by mouth daily.     Yes Historical Provider, MD  hydrALAZINE (APRESOLINE) 10 MG tablet Take 20 mg by mouth 2 (two) times daily.    Yes Historical Provider, MD  metoprolol (TOPROL-XL) 100 MG 24 hr  tablet Take 100 mg by mouth daily.     Yes Historical Provider, MD  pantoprazole (PROTONIX) 40 MG tablet Take 1 tablet (40 mg total) by mouth daily. 01/04/12  Yes Andria Meuse, NP  valsartan-hydrochlorothiazide (DIOVAN-HCT) 320-12.5 MG per tablet Take 1 tablet by mouth daily.   Yes Historical Provider, MD   Physical Exam: Filed Vitals:   07/30/12 0804 07/30/12 0900 07/30/12 1115 07/30/12 1200  BP: 157/87 154/69 128/74 136/77   Pulse: 88  78 82  Temp: 98.5 F (36.9 C)     TempSrc: Oral     Resp:  14 14 23   Height: 5\' 4"  (1.626 m)     Weight: 81.647 kg (180 lb)     SpO2: 96%  98% 98%     General:  Well nourished pleasant NAD  Eyes: PERRL EOMI no scleral icterus  ENT: ears clear, nose with out drainage, mucus membrane mouth moist/pink  Neck: supple no JVD no lymphadenopathy  Cardiovascular: RRR No MGR No LE edema PPP  Respiratory: normal effort BS clear bilaterally no wheeze no rhonchi  Abdomen: flat soft +BS non-tender to palpation  Skin: warm dry no rash or lesion  Musculoskeletal: MOE, no clubbing or cyanosis  Psychiatric: calm cooperative   Neurologic: cranial nerve II-XII intact  Labs on Admission:  Basic Metabolic Panel:  Recent Labs Lab 07/30/12 0814  NA 137  K 4.3  CL 101  CO2 26  GLUCOSE 98  BUN 22  CREATININE 1.29  CALCIUM 9.2   Liver Function Tests: No results found for this basename: AST, ALT, ALKPHOS, BILITOT, PROT, ALBUMIN,  in the last 168 hours No results found for this basename: LIPASE, AMYLASE,  in the last 168 hours No results found for this basename: AMMONIA,  in the last 168 hours CBC:  Recent Labs Lab 07/30/12 0814  WBC 8.0  NEUTROABS 4.4  HGB 13.2  HCT 40.9  MCV 89.1  PLT 119*   Cardiac Enzymes:  Recent Labs Lab 07/30/12 0814 07/30/12 1005  TROPONINI <0.30 <0.30    BNP (last 3 results)  Recent Labs  07/30/12 1213  PROBNP 76.3   CBG: No results found for this basename: GLUCAP,  in the last 168 hours  Radiological Exams on Admission: Dg Chest Portable 1 View  07/30/2012  *RADIOLOGY REPORT*  Clinical Data: Chest pain.  Prior history of MI.  PORTABLE CHEST - 1 VIEW 07/30/2012 0805 hours:  Comparison: Portable chest x-ray 01/30/2007, 08/16/2003, 07/12/2003.  Findings: Cardiac silhouette mildly enlarged for AP portable technique, unchanged.  Pulmonary vascularity normal without evidence pulmonary edema.  Streaky opacity in the lung bases  and in the inferior right upper lobe.  Emphysematous changes suspected in the upper lobes.  IMPRESSION: Mild cardiomegaly without pulmonary edema.  Probable COPD/emphysema.  Atelectasis in the lung bases and in the inferior right upper lobe.   Original Report Authenticated By: Evangeline Dakin, M.D.     EKG: Independently reviewed. NSR with incomplete RBBB and LVH  Assessment/Plan Principal Problem:   Chest pain: atypical. Will admit to tele given cardiac hx, family hx. Will cycle troponin, get echo as well. Will provide supportive care with NTG, morphine, zofran and oxygen. Will request cardiac consult. Continue aspirin, and BB.  Active Problems:    HYPERLIPIDEMIA: will check lipid panel and continue home med.    HYPERTENSION: controlled. Will continue home BB, Diovan, apresoline. Monitor     CORONARY ARTERY DISEASE: see #1. Pt has not seen cardiologist in many years.  GERD: stable at baseline. Continue PPI  Duodenal ulcer:  Stable at baseline. Continue PPI.    Cardiology consult    Code Status: full Family Communication:  Disposition Plan: Home when ready hopefully tomorrow  Time spent: 65 minutes.  [facility]   If 7PM-7AM, please contact night-coverage [email or web address] Password [email or web address] 07/30/2012, 1:33 PM

## 2012-07-30 NOTE — ED Notes (Signed)
Called to check on bed status. Bed control to contact Charge RN on 300.

## 2012-07-30 NOTE — Progress Notes (Signed)
*  PRELIMINARY RESULTS* Echocardiogram 2D Echocardiogram has been performed.  Janalee Dane M 07/30/2012, 4:46 PM

## 2012-07-31 DIAGNOSIS — I2510 Atherosclerotic heart disease of native coronary artery without angina pectoris: Secondary | ICD-10-CM

## 2012-07-31 LAB — BASIC METABOLIC PANEL
BUN: 18 mg/dL (ref 6–23)
Chloride: 99 mEq/L (ref 96–112)
Creatinine, Ser: 1.42 mg/dL — ABNORMAL HIGH (ref 0.50–1.35)
GFR calc Af Amer: 49 mL/min — ABNORMAL LOW (ref >90)
Glucose, Bld: 104 mg/dL — ABNORMAL HIGH (ref 70–99)

## 2012-07-31 LAB — CBC
HCT: 41.5 % (ref 39.0–52.0)
Hemoglobin: 13.7 g/dL (ref 13.0–17.0)
MCH: 29.3 pg (ref 26.0–34.0)
MCV: 88.7 fL (ref 78.0–100.0)
Platelets: 121 10*3/uL — ABNORMAL LOW (ref 150–400)
RBC: 4.68 MIL/uL (ref 4.22–5.81)

## 2012-07-31 MED ORDER — SODIUM CHLORIDE 0.9 % IV SOLN
1.0000 mL/kg/h | INTRAVENOUS | Status: DC
  Administered 2012-08-01: 1 mL/kg/h via INTRAVENOUS

## 2012-07-31 MED ORDER — SODIUM CHLORIDE 0.9 % IV SOLN: 250.0000 mL | INTRAVENOUS | Status: DC | PRN

## 2012-07-31 MED ORDER — SODIUM CHLORIDE 0.9 % IJ SOLN: 3.0000 mL | INTRAMUSCULAR | Status: DC | PRN

## 2012-07-31 MED ORDER — ATORVASTATIN CALCIUM 80 MG PO TABS
80.0000 mg | ORAL_TABLET | Freq: Every day | ORAL | Status: DC
  Administered 2012-07-31 – 2012-08-02 (×3): 80 mg via ORAL
  Filled 2012-07-31 (×4): qty 1

## 2012-07-31 MED ORDER — SODIUM CHLORIDE 0.9 % IJ SOLN: 3.0000 mL | Freq: Two times a day (BID) | INTRAMUSCULAR | Status: DC

## 2012-07-31 MED ORDER — ASPIRIN 81 MG PO CHEW
324.0000 mg | CHEWABLE_TABLET | ORAL | Status: AC
  Administered 2012-08-01: 324 mg via ORAL
  Filled 2012-07-31: qty 4

## 2012-07-31 MED ORDER — DIAZEPAM 2 MG PO TABS
2.0000 mg | ORAL_TABLET | ORAL | Status: AC
Start: 2012-08-01 — End: 2012-08-01
  Administered 2012-08-01: 2 mg via ORAL
  Filled 2012-07-31: qty 1

## 2012-07-31 MED ORDER — ATORVASTATIN CALCIUM 20 MG PO TABS: 20.0000 mg | ORAL_TABLET | Freq: Every day | ORAL | Status: DC

## 2012-07-31 NOTE — Progress Notes (Signed)
After discussed pt with Dr. Rollene Fare, I have contacted [facility] for pt transfer to tele bed at [facility].  We will take on our service.

## 2012-07-31 NOTE — H&P (Signed)
79 year old gentleman transferred by [facility] to tele bed here at [facility] for Cardiac cath in AM with Dr. Ellyn Hack.  Please see Dr. Lowella Fairy note from earlier.    Last Cath 01/30/07 with     1. Left main normal.  2. LAD; LAD had 75% ostial lesion in the takeoff of first small to  moderate-sized diagonal branch. There was a 50% mid lesion in the  LAD itself as well.  3. Left circumflex; this is a dominant vessel with 50 and 70% lesions  in the PDA.  4. Ramus branch; moderate size vessel free of significant disease.  5. Right coronary artery; nondominant vessel with a 75% proximal  stenosis.  6. Left ventriculography; RAO left ventriculogram was performed using  a 20 mL of Visipaque dye at 10 mL per second. The overall LVEF was  estimated at greater than 60% without focal wall motion  abnormalities.  7. Distal abdominal aortography; distal abdominal aortogram was  performed using 20 mL of Visipaque dye at 20 mL per second. There  was approximately 50% right renal artery stenosis. Infrarenal  abdominal aorta and iliac bifurcation appear free of significant  atherosclerotic changes.    Previous caths in 2003, 1996,1992, [place].  Per pt he had interventions done in [place].   He has done well until admit 07/30/12.  Troponin I's all negative. LDL elevated at 125.  2DEcho 07/30/12:  Left ventricle: The cavity size was normal. Wall thickness was increased increased in a pattern of mild to moderate LVH. Systolic function was normal. The estimated ejection fraction was in the range of 60% to 65%. Wall motion was normal; there were no regional wall motion abnormalities. Doppler parameters are consistent with abnormal left ventricular relaxation (grade 1 diastolic dysfunction). Doppler parameters are consistent with elevated ventricular end-diastolic filling pressure. No previous study for comparison. - Aortic valve: Mildly calcified annulus. Trileaflet. Trivial  regurgitation. - Mitral valve: Calcified annulus. Trivial regurgitation. - Right atrium: Central venous pressure: 32mm Hg (est). - Tricuspid valve: Trivial regurgitation. - Pulmonary arteries: Systolic pressure could not be accurately estimated. - Pericardium, extracardiac: There was no pericardial effusion  VS BP 161/97 up from 138/71 prior to transfer. P 72, R 14 T 98.7 General:alert and oriented, Pleasant affect. NAD. Skin:warm and dry, brisk capillary refill HEENT:normocephalic sclera clear Neck:+ JVD Heart:S1S2 RRR Lungs:few crackles Abd:+ BS soft, non tender Ext:no edema Neuro:alert and oriented, MAE.    Assessment: Principal Problem:   Chest pain Active Problems:   HYPERLIPIDEMIA   HYPERTENSION   CORONARY ARTERY DISEASE   GERD   PLAN:  Cardiac cath in AM.  Will continue current meds. No chest pain currently. Last episode of pain this AM.

## 2012-07-31 NOTE — Consult Note (Signed)
Reason for Consult: New-onset chest pain on limited activity With a known history of coronary disease  Referring Physician: Dr. Fredric Mare Primary physician is Dr. Berneta Sages he'll  Anthony Galvan is an 79 y.o. male.    Chief Complaint: Chest pain while getting into car yesterday morning radiating to the neck prompting a call to the EMS and was taken to [facility] for evaluation.  HPI: Anthony Galvan is an 79 year old Afro-American widowed (x25 years) father of 3 grandfather 3 and 3 GF 3. He has never been a smoker. Yesterday morning while going into his car and rainy weather he began having substernal chest pain. This was pressure-like fullness that radiated to his neck he had associated mild shortness of breath with no nausea vomiting or diaphoresis. He went back into the house and his pain which was not relieved spontaneously he called EMS. He was taken to [facility] emergency room. Evaluation was done which showed initially a negative troponin no acute EKG changes and he was given nitroglycerin and had relief of his chest pain. He was hospitalized and subsequent serial enzymes and troponins have been negative for ischemia and EKGs have shown no acute changes. He's had normal renal function and lipids are elevated he is not on statin therapy presently says he was on at home.  Is associated history of hypertension.  He has a long cardiac history. Unfortunately we don't have any past records available at present. He currently had a myocardial infarction in 1976. He had another myocardial infarction in 1980 and it was initially seen I believe [facility] but is not sure this. He was then transferred to the [facility].  He has had at least 2 catheterizations one in 1980 and he thinks his last catheterization was in 2003. He has been under the care of Dr. Berneta Sages he'll has been doing quite well walks almost 4-5 miles a day he has never been a smoker.  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 Today in the hospital and go to the bathroom he had substernal chest discomfort which radiated to his neck but this was mildly sent down next to his bed and this was relieved spontaneously and he has been concerned about getting up further since that time. Been under good control in the hospital and he has remained afebrile. He hadn't seen in office remotely around 2000 and 2000 but these records not available at present  Past Medical History  Diagnosis Date  . Duodenal ulcer May 2011    on EGD  . Helicobacter pylori gastritis MAY 2012 HP STOOL AG NEG  . GERD (gastroesophageal reflux disease)   . CAD (coronary artery disease)   . MI (myocardial infarction)   . Hyperlipidemia   . HTN (hypertension)   . Osteoarthritis   . S/P colonoscopy 2011    normal, internal hemorrhoids    Past Surgical History  Procedure Laterality Date  . Cardiac cath with stents    . Cataracts    . Colonoscopy  OCT 2011 ARS    SML IH  . Upper gastrointestinal endoscopy  MAY 2011 MELENA, HEMATEMESIS    DUODENAL ULCER, Bx: H PYLORI POS    Family History  Problem Relation Age of Onset  . Colon cancer Neg Hx    Social History:  reports that he has never smoked. He does not have any smokeless tobacco history on file. He reports that  he does not drink alcohol or use illicit drugs.  Allergies: No Known Allergies  Medications Prior to Admission  Medication Sig Dispense Refill  . aspirin 81 MG tablet Take 81 mg by mouth daily.        . hydrALAZINE (APRESOLINE) 10 MG tablet Take 20 mg by mouth 2 (two) times daily.       . metoprolol (TOPROL-XL) 100 MG 24 hr tablet Take 100 mg by mouth daily.        . pantoprazole (PROTONIX) 40 MG tablet Take 1 tablet (40 mg total) by mouth daily.  30 tablet  11  . valsartan-hydrochlorothiazide (DIOVAN-HCT) 320-12.5 MG per tablet Take 1 tablet by mouth daily.        Results for orders placed during the hospital  encounter of 07/30/12 (from the past 48 hour(s))  CBC WITH DIFFERENTIAL     Status: Abnormal   Collection Time    07/30/12  8:14 AM      Result Value Range   WBC 8.0  4.0 - 10.5 K/uL   RBC 4.59  4.22 - 5.81 MIL/uL   Hemoglobin 13.2  13.0 - 17.0 g/dL   HCT 40.9  39.0 - 52.0 %   MCV 89.1  78.0 - 100.0 fL   MCH 28.8  26.0 - 34.0 pg   MCHC 32.3  30.0 - 36.0 g/dL   RDW 11.7  11.5 - 15.5 %   Platelets 119 (*) 150 - 400 K/uL   Neutrophils Relative 55  43 - 77 %   Neutro Abs 4.4  1.7 - 7.7 K/uL   Lymphocytes Relative 35  12 - 46 %   Lymphs Abs 2.8  0.7 - 4.0 K/uL   Monocytes Relative 6  3 - 12 %   Monocytes Absolute 0.5  0.1 - 1.0 K/uL   Eosinophils Relative 4  0 - 5 %   Eosinophils Absolute 0.3  0.0 - 0.7 K/uL   Basophils Relative 1  0 - 1 %   Basophils Absolute 0.0  0.0 - 0.1 K/uL  TROPONIN I     Status: None   Collection Time    07/30/12  8:14 AM      Result Value Range   Troponin I <0.30  <0.30 ng/mL   Comment:            Due to the release kinetics of cTnI,     a negative result within the first hours     of the onset of symptoms does not rule out     myocardial infarction with certainty.     If myocardial infarction is still suspected,     repeat the test at appropriate intervals.  BASIC METABOLIC PANEL     Status: Abnormal   Collection Time    07/30/12  8:14 AM      Result Value Range   Sodium 137  135 - 145 mEq/L   Potassium 4.3  3.5 - 5.1 mEq/L   Chloride 101  96 - 112 mEq/L   CO2 26  19 - 32 mEq/L   Glucose, Bld 98  70 - 99 mg/dL   BUN 22  6 - 23 mg/dL   Creatinine, Ser 1.29  0.50 - 1.35 mg/dL   Calcium 9.2  8.4 - 10.5 mg/dL   GFR calc non Af Amer 48 (*) >90 mL/min   GFR calc Af Amer 55 (*) >90 mL/min   Comment:            The  eGFR has been calculated     using the CKD EPI equation.     This calculation has not been     validated in all clinical     situations.     eGFR's persistently     <90 mL/min signify     possible Chronic Kidney Disease.  TROPONIN I      Status: None   Collection Time    07/30/12 10:05 AM      Result Value Range   Troponin I <0.30  <0.30 ng/mL   Comment:            Due to the release kinetics of cTnI,     a negative result within the first hours     of the onset of symptoms does not rule out     myocardial infarction with certainty.     If myocardial infarction is still suspected,     repeat the test at appropriate intervals.  LIPID PANEL     Status: Abnormal   Collection Time    07/30/12 10:05 AM      Result Value Range   Cholesterol 231 (*) 0 - 200 mg/dL   Triglycerides 165 (*) <150 mg/dL   HDL 73  >39 mg/dL   Total CHOL/HDL Ratio 3.2     VLDL 33  0 - 40 mg/dL   LDL Cholesterol 125 (*) 0 - 99 mg/dL   Comment:            Total Cholesterol/HDL:CHD Risk     Coronary Heart Disease Risk Table                         Men   Women      1/2 Average Risk   3.4   3.3      Average Risk       5.0   4.4      2 X Average Risk   9.6   7.1      3 X Average Risk  23.4   11.0                Use the calculated Patient Ratio     above and the CHD Risk Table     to determine the patient's CHD Risk.                ATP III CLASSIFICATION (LDL):      <100     mg/dL   Optimal      100-129  mg/dL   Near or Above                        Optimal      130-159  mg/dL   Borderline      160-189  mg/dL   High      >190     mg/dL   Very High  PRO B NATRIURETIC PEPTIDE     Status: None   Collection Time    07/30/12 12:13 PM      Result Value Range   Pro B Natriuretic peptide (BNP) 76.3  0 - 450 pg/mL  TROPONIN I     Status: None   Collection Time    07/30/12  4:31 PM      Result Value Range   Troponin I <0.30  <0.30 ng/mL   Comment:            Due to the release kinetics  of cTnI,     a negative result within the first hours     of the onset of symptoms does not rule out     myocardial infarction with certainty.     If myocardial infarction is still suspected,     repeat the test at appropriate intervals.  TROPONIN I      Status: None   Collection Time    07/30/12 10:00 PM      Result Value Range   Troponin I <0.30  <0.30 ng/mL   Comment:            Due to the release kinetics of cTnI,     a negative result within the first hours     of the onset of symptoms does not rule out     myocardial infarction with certainty.     If myocardial infarction is still suspected,     repeat the test at appropriate intervals.   Dg Chest Portable 1 View  07/30/2012  *RADIOLOGY REPORT*  Clinical Data: Chest pain.  Prior history of MI.  PORTABLE CHEST - 1 VIEW 07/30/2012 0805 hours:  Comparison: Portable chest x-ray 01/30/2007, 08/16/2003, 07/12/2003.  Findings: Cardiac silhouette mildly enlarged for AP portable technique, unchanged.  Pulmonary vascularity normal without evidence pulmonary edema.  Streaky opacity in the lung bases and in the inferior right upper lobe.  Emphysematous changes suspected in the upper lobes.  IMPRESSION: Mild cardiomegaly without pulmonary edema.  Probable COPD/emphysema.  Atelectasis in the lung bases and in the inferior right upper lobe.   Original Report Authenticated By: Evangeline Dakin, M.D.     ROS: There is a prior history of GERD he is on chronic PPI therapy remote history of [place] back there pylori gastritis or duodenal ulcer. No recent upper lower GI bleeding or significant reflux symptoms. His had no CNS events no undue arthritis and no recent hospitalizations or ER visits other than above. Remainder of his review of systems is unremarkable other than outlined above Blood pressure 149/72, pulse 75, temperature 98 F (36.7 C), temperature source Oral, resp. rate 20, height 5\' 4"  (1.626 m), weight 79.8 kg (175 lb 14.8 oz), SpO2 98.00%. PE: On exam he appears younger than his stated age of 3. He is euthyroid appearing comfortable 10-20 and there is no overt CHF. There are no significant carotid bruits there is no chest scars. Thyroid is not enlarged. Blood pressure is 130/80 his rhythm is  regular. Skin is warm and dry. His chest shows bilateral dry lower and mid and basal rate basilar rales and crackles with no wheezes. The PMI is in the left fifth ICS near the ends just outside the [facility] there is an S4 audible mild increased splitting of second heart sound. There is a short 2/6 systolic murmur at the left sternal border no diastolic murmur or rubs good carotid upstroke no S3 JVD or a chair reflux. Liver spleen kidney not palpable abdomen nontender bowel sounds are negative. HEENT is unremarkable. Pedal pulses DP and PT are intact femorals intact without bruits there is no significant varicosities and no venous or arterial ulcerations or no pathologic reflexes he is oriented x3 and mental status and hearing are quite good  Assessment/Plan Mr. Furner has nitrate responsive chest pain that is compatible with ischemia. EKGs in the hospital he'll sinus rhythm left axis deviation normal PR interval and RSR prime in V1 compatible with RV conduction delay. This is unchanged no significant ST or T wave changes. Despite negative  enzymes he had recurrent episode of chest discomfort that sounds fairly typical a.m. on limited activity. There are no acute EKG changes and negative enzymes but I believe he should have further evaluation of these symptoms. He has had recurrent chest pain on medical therapy in the hospital I would recommend cardiac catheterization be repeated at this time. We'll then be able to walk will also be able to access his past And anatomic records but he has not had catheterization for over 10 years now. He has not had a recent physiologic exercise test I would proceed directly to catheterization in the setting. He may require exercise testing in the future depending upon his findings and anatomy. I would go ahead and start him on Lipitor 80 is hyperlipidemia and known coronary disease. I have discussed all this with the patient and his sister. The patient is agreeable to further  evaluation and diarrhea peak catheterization. All make arrangements for transfer him to [facility] to accomplish this and access his other records. He also lives self but he has a fortysomething-year-old grandson who stays with him. He is very active and ambulatory and functionally he has been in excellent despite his apparent extensive coronary history. He appears younger than his stated age of 35 as well.  Rollene Fare, Donya Hitch A 07/31/2012,   Referring Physician:   EURAL HOVORKA is an 78 y.o. male.    Chief Complaint:   HPI:   Past Medical History  Diagnosis Date  . Duodenal ulcer May 2011    on EGD  . Helicobacter pylori gastritis MAY 2012 HP STOOL AG NEG  . GERD (gastroesophageal reflux disease)   . CAD (coronary artery disease)   . MI (myocardial infarction)   . Hyperlipidemia   . HTN (hypertension)   . Osteoarthritis   . S/P colonoscopy 2011    normal, internal hemorrhoids    Past Surgical History  Procedure Laterality Date  . Cardiac cath with stents    . Cataracts    . Colonoscopy  OCT 2011 ARS    SML IH  . Upper gastrointestinal endoscopy  MAY 2011 MELENA, HEMATEMESIS    DUODENAL ULCER, Bx: H PYLORI POS    Family History  Problem Relation Age of Onset  . Colon cancer Neg Hx    Social History:  reports that he has never smoked. He does not have any smokeless tobacco history on file. He reports that he does not drink alcohol or use illicit drugs.  Allergies: No Known Allergies  Medications Prior to Admission  Medication Sig Dispense Refill  . aspirin 81 MG tablet Take 81 mg by mouth daily.        . hydrALAZINE (APRESOLINE) 10 MG tablet Take 20 mg by mouth 2 (two) times daily.       . metoprolol (TOPROL-XL) 100 MG 24 hr tablet Take 100 mg by mouth daily.        . pantoprazole (PROTONIX) 40 MG tablet Take 1 tablet (40 mg total) by mouth daily.  30 tablet  11  . valsartan-hydrochlorothiazide (DIOVAN-HCT) 320-12.5 MG per tablet Take 1 tablet by mouth daily.         Results for orders placed during the hospital encounter of 07/30/12 (from the past 48 hour(s))  CBC WITH DIFFERENTIAL     Status: Abnormal   Collection Time    07/30/12  8:14 AM      Result Value Range   WBC 8.0  4.0 - 10.5 K/uL   RBC 4.59  4.22 - 5.81 MIL/uL   Hemoglobin 13.2  13.0 - 17.0 g/dL   HCT 40.9  39.0 - 52.0 %   MCV 89.1  78.0 - 100.0 fL   MCH 28.8  26.0 - 34.0 pg   MCHC 32.3  30.0 - 36.0 g/dL   RDW 11.7  11.5 - 15.5 %   Platelets 119 (*) 150 - 400 K/uL   Neutrophils Relative 55  43 - 77 %   Neutro Abs 4.4  1.7 - 7.7 K/uL   Lymphocytes Relative 35  12 - 46 %   Lymphs Abs 2.8  0.7 - 4.0 K/uL   Monocytes Relative 6  3 - 12 %   Monocytes Absolute 0.5  0.1 - 1.0 K/uL   Eosinophils Relative 4  0 - 5 %   Eosinophils Absolute 0.3  0.0 - 0.7 K/uL   Basophils Relative 1  0 - 1 %   Basophils Absolute 0.0  0.0 - 0.1 K/uL  TROPONIN I     Status: None   Collection Time    07/30/12  8:14 AM      Result Value Range   Troponin I <0.30  <0.30 ng/mL   Comment:            Due to the release kinetics of cTnI,     a negative result within the first hours     of the onset of symptoms does not rule out     myocardial infarction with certainty.     If myocardial infarction is still suspected,     repeat the test at appropriate intervals.  BASIC METABOLIC PANEL     Status: Abnormal   Collection Time    07/30/12  8:14 AM      Result Value Range   Sodium 137  135 - 145 mEq/L   Potassium 4.3  3.5 - 5.1 mEq/L   Chloride 101  96 - 112 mEq/L   CO2 26  19 - 32 mEq/L   Glucose, Bld 98  70 - 99 mg/dL   BUN 22  6 - 23 mg/dL   Creatinine, Ser 1.29  0.50 - 1.35 mg/dL   Calcium 9.2  8.4 - 10.5 mg/dL   GFR calc non Af Amer 48 (*) >90 mL/min   GFR calc Af Amer 55 (*) >90 mL/min   Comment:            The eGFR has been calculated     using the CKD EPI equation.     This calculation has not been     validated in all clinical     situations.     eGFR's persistently     <90 mL/min  signify     possible Chronic Kidney Disease.  TROPONIN I     Status: None   Collection Time    07/30/12 10:05 AM      Result Value Range   Troponin I <0.30  <0.30 ng/mL   Comment:            Due to the release kinetics of cTnI,     a negative result within the first hours     of the onset of symptoms does not rule out     myocardial infarction with certainty.     If myocardial infarction is still suspected,     repeat the test at appropriate intervals.  LIPID PANEL     Status: Abnormal   Collection Time    07/30/12 10:05 AM  Result Value Range   Cholesterol 231 (*) 0 - 200 mg/dL   Triglycerides 165 (*) <150 mg/dL   HDL 73  >39 mg/dL   Total CHOL/HDL Ratio 3.2     VLDL 33  0 - 40 mg/dL   LDL Cholesterol 125 (*) 0 - 99 mg/dL   Comment:            Total Cholesterol/HDL:CHD Risk     Coronary Heart Disease Risk Table                         Men   Women      1/2 Average Risk   3.4   3.3      Average Risk       5.0   4.4      2 X Average Risk   9.6   7.1      3 X Average Risk  23.4   11.0                Use the calculated Patient Ratio     above and the CHD Risk Table     to determine the patient's CHD Risk.                ATP III CLASSIFICATION (LDL):      <100     mg/dL   Optimal      100-129  mg/dL   Near or Above                        Optimal      130-159  mg/dL   Borderline      160-189  mg/dL   High      >190     mg/dL   Very High  PRO B NATRIURETIC PEPTIDE     Status: None   Collection Time    07/30/12 12:13 PM      Result Value Range   Pro B Natriuretic peptide (BNP) 76.3  0 - 450 pg/mL  TROPONIN I     Status: None   Collection Time    07/30/12  4:31 PM      Result Value Range   Troponin I <0.30  <0.30 ng/mL   Comment:            Due to the release kinetics of cTnI,     a negative result within the first hours     of the onset of symptoms does not rule out     myocardial infarction with certainty.     If myocardial infarction is still suspected,      repeat the test at appropriate intervals.  TROPONIN I     Status: None   Collection Time    07/30/12 10:00 PM      Result Value Range   Troponin I <0.30  <0.30 ng/mL   Comment:            Due to the release kinetics of cTnI,     a negative result within the first hours     of the onset of symptoms does not rule out     myocardial infarction with certainty.     If myocardial infarction is still suspected,     repeat the test at appropriate intervals.   Dg Chest Portable 1 View  07/30/2012  *RADIOLOGY REPORT*  Clinical Data: Chest pain.  Prior history of MI.  PORTABLE CHEST -  1 VIEW 07/30/2012 0805 hours:  Comparison: Portable chest x-ray 01/30/2007, 08/16/2003, 07/12/2003.  Findings: Cardiac silhouette mildly enlarged for AP portable technique, unchanged.  Pulmonary vascularity normal without evidence pulmonary edema.  Streaky opacity in the lung bases and in the inferior right upper lobe.  Emphysematous changes suspected in the upper lobes.  IMPRESSION: Mild cardiomegaly without pulmonary edema.  Probable COPD/emphysema.  Atelectasis in the lung bases and in the inferior right upper lobe.   Original Report Authenticated By: Evangeline Dakin, M.D.     ROS: Blood pressure 149/72, pulse 75, temperature 98 F (36.7 C), temperature source Oral, resp. rate 20, height 5\' 4"  (1.626 m), weight 79.8 kg (175 lb 14.8 oz), SpO2 98.00%. PE:   Assessment/Plan  Terance Ice A 07/31/2012, 12:04 PM

## 2012-07-31 NOTE — Progress Notes (Signed)
UR Chart Review Completed  

## 2012-07-31 NOTE — Progress Notes (Addendum)
[facility] PROGRESS NOTE  Anthony Galvan [id number] DOB: 01/16/1922 DOA: 07/30/2012 PCP: Maggie Font, MD  Assessment/Plan: Chest pain: atypical. States no further pain until this am when he was washing face at sink. Pain 2/10 and quickly resolved. Troponin neg to date, get echo with 55% EF and grade 1 diastolic dysfunction.  continue supportive care with NTG, morphine, zofran and oxygen. Await cardiology  Consult. Follow up call indicates Dr. Eilene Ghazi will see this afternoon.  Continue aspirin, and BB.  Active Problems:  HYPERLIPIDEMIA: chol 231, triglycerides 165, LDL 125. Defer to cards.   HYPERTENSION: controlled. Will continue home BB, Diovan, apresoline. Monitor   CORONARY ARTERY DISEASE: see #1. Pt has not seen cardiologist in many years. [facility] MD to see today.  GERD: stable at baseline. Continue PPI . Good appetite  Duodenal ulcer: Stable at baseline. Continue PPI.   Code Status: full Family Communication:  Disposition Plan: home when ready likely tomorrow.  Consultants:  [facility]  Procedures:  none  Antibiotics:  none (indicate start date, and stop date if known)  HPI/Subjective: Sitting in chair reading. Smiling. NAD  Objective: Filed Vitals:   07/30/12 1330 07/30/12 1424 07/30/12 2147 07/31/12 0604  BP: 123/62 156/66 146/61 149/72  Pulse: 78 80 66 75  Temp:  97.8 F (36.6 C) 97.3 F (36.3 C) 98 F (36.7 C)  TempSrc:  Oral Oral   Resp: 14 16 20 20   Height:  5\' 4"  (1.626 m)    Weight:  79.8 kg (175 lb 14.8 oz)    SpO2: 98% 99% 97% 98%    Intake/Output Summary (Last 24 hours) at 07/31/12 0953 Last data filed at 07/30/12 2138  Gross per 24 hour  Intake    240 ml  Output    200 ml  Net     40 ml   Filed Weights   07/30/12 0804 07/30/12 1424  Weight: 81.647 kg (180 lb) 79.8 kg (175 lb 14.8 oz)    Exam:   General:  Awake alert oriented x3  Cardiovascular: RRR No MGR No LE edema  Respiratory: normal affort  BS with fine crackles bilateral bases. No wheeze, no rhonchi  Abdomen: soft +BS non-tender to palpation  Musculoskeletal: no clubbing no cyanosis.    Data Reviewed: Basic Metabolic Panel:  Recent Labs Lab 07/30/12 0814  NA 137  K 4.3  CL 101  CO2 26  GLUCOSE 98  BUN 22  CREATININE 1.29  CALCIUM 9.2   Liver Function Tests: No results found for this basename: AST, ALT, ALKPHOS, BILITOT, PROT, ALBUMIN,  in the last 168 hours No results found for this basename: LIPASE, AMYLASE,  in the last 168 hours No results found for this basename: AMMONIA,  in the last 168 hours CBC:  Recent Labs Lab 07/30/12 0814  WBC 8.0  NEUTROABS 4.4  HGB 13.2  HCT 40.9  MCV 89.1  PLT 119*   Cardiac Enzymes:  Recent Labs Lab 07/30/12 0814 07/30/12 1005 07/30/12 1631 07/30/12 2200  TROPONINI <0.30 <0.30 <0.30 <0.30   BNP (last 3 results)  Recent Labs  07/30/12 1213  PROBNP 76.3   CBG: No results found for this basename: GLUCAP,  in the last 168 hours  No results found for this or any previous visit (from the past 240 hour(s)).   Studies: Dg Chest Portable 1 View  07/30/2012  *RADIOLOGY REPORT*  Clinical Data: Chest pain.  Prior history of MI.  PORTABLE CHEST - 1 VIEW 07/30/2012 0805 hours:  Comparison: Portable  chest x-ray 01/30/2007, 08/16/2003, 07/12/2003.  Findings: Cardiac silhouette mildly enlarged for AP portable technique, unchanged.  Pulmonary vascularity normal without evidence pulmonary edema.  Streaky opacity in the lung bases and in the inferior right upper lobe.  Emphysematous changes suspected in the upper lobes.  IMPRESSION: Mild cardiomegaly without pulmonary edema.  Probable COPD/emphysema.  Atelectasis in the lung bases and in the inferior right upper lobe.   Original Report Authenticated By: Evangeline Dakin, M.D.     Scheduled Meds: . aspirin EC  81 mg Oral Daily  . enoxaparin (LOVENOX) injection  40 mg Subcutaneous Q24H  . hydrALAZINE  20 mg Oral BID  .  irbesartan  300 mg Oral Daily   And  . hydrochlorothiazide  12.5 mg Oral Daily  . metoprolol succinate  100 mg Oral Daily  . pantoprazole  40 mg Oral Daily  . sodium chloride  3 mL Intravenous Q12H   Continuous Infusions:   Principal Problem:   Chest pain Active Problems:   HYPERLIPIDEMIA   HYPERTENSION   CORONARY ARTERY DISEASE   GERD  Time spent: 30 minutes  [facility]  If 7PM-7AM, please contact night-coverage at [email or web address], password [facility] 07/31/2012, 9:53 AM  LOS: 1 day   Attending: Patient seen and examined. This very pleasant 79 year old man, who does have a history of coronary artery disease, presented with chest pain, which he describes a combination of sharp as well as dull, associated with sweating and dyspnea. Serial cardiac enzymes are negative. Electrocardiogram is fairly unremarkable. Echocardiogram does not show any regional wall abnormalities. His examination is unremarkable also with no chest wall tenderness, pericardial rub or pleural rub. He does not appear to be in heart failure. Plan: 1. Check d-dimer. 2. Await cardiology opinion.

## 2012-07-31 NOTE — H&P (Signed)
NAME:  Anthony Galvan   MRN: [id number] DOB:  July 19, 1921   ADMIT DATE: 07/30/2012  I have seen & examined the patient along with Cecilie Kicks.  I agree with her update of Dr. Lowella Fairy detailed consult note for details.  He was transferred for cardiac catheterization.  Performing MD:  Leonie Man, M.D., M.S.  Procedure:  Left Heart Catheterization with possible PCI  The procedure with Risks/Benefits/Alternatives and Indications was reviewed with the patient.  All questions were answered.    Risks / Complications include, but not limited to: Death, MI, CVA/TIA, VF/VT (with defibrillation), Bradycardia (need for temporary pacer placement), contrast induced nephropathy, bleeding / bruising / hematoma / pseudoaneurysm, vascular or coronary injury (with possible emergent CT or Vascular Surgery), adverse medication reactions, infection.    The patient voices understanding and agree to proceed.   I have signed the consent form and placed it on the chart for patient signature and RN witness.     Leonie Man, M.D., M.S. [facility] [address] [place], [place]  [address]  [phone]  07/31/2012 4:59 PM

## 2012-08-01 ENCOUNTER — Other Ambulatory Visit: Payer: Self-pay

## 2012-08-01 ENCOUNTER — Encounter (HOSPITAL_COMMUNITY)
Admission: EM | Disposition: A | Discharge status: Home / Self Care | Payer: Self-pay | Source: Home / Self Care | Attending: Cardiology

## 2012-08-01 ENCOUNTER — Ambulatory Visit (HOSPITAL_COMMUNITY): Admission: RE | Admit: 2012-08-01 | Payer: Medicare Other | Source: Ambulatory Visit | Admitting: Cardiology

## 2012-08-01 ENCOUNTER — Encounter (HOSPITAL_COMMUNITY): Payer: Self-pay | Admitting: Cardiology

## 2012-08-01 DIAGNOSIS — Z955 Presence of coronary angioplasty implant and graft: Secondary | ICD-10-CM

## 2012-08-01 DIAGNOSIS — I2510 Atherosclerotic heart disease of native coronary artery without angina pectoris: Secondary | ICD-10-CM

## 2012-08-01 HISTORY — PX: PERCUTANEOUS STENT INTERVENTION: SHX5500

## 2012-08-01 HISTORY — PX: LEFT HEART CATHETERIZATION WITH CORONARY ANGIOGRAM: SHX5451

## 2012-08-01 HISTORY — DX: Atherosclerotic heart disease of native coronary artery without angina pectoris: I25.10

## 2012-08-01 LAB — HEPATIC FUNCTION PANEL
ALT: 11 U/L (ref 0–53)
AST: 17 U/L (ref 0–37)
Alkaline Phosphatase: 63 U/L (ref 39–117)
Bilirubin, Direct: 0.1 mg/dL (ref 0.0–0.3)
Indirect Bilirubin: 0.6 mg/dL (ref 0.3–0.9)
Total Bilirubin: 0.7 mg/dL (ref 0.3–1.2)

## 2012-08-01 LAB — BASIC METABOLIC PANEL
BUN: 22 mg/dL (ref 6–23)
CO2: 31 mEq/L (ref 19–32)
Chloride: 102 mEq/L (ref 96–112)
Glucose, Bld: 87 mg/dL (ref 70–99)
Potassium: 4.5 mEq/L (ref 3.5–5.1)
Sodium: 137 mEq/L (ref 135–145)

## 2012-08-01 SURGERY — LEFT HEART CATHETERIZATION WITH CORONARY ANGIOGRAM
Anesthesia: LOCAL

## 2012-08-01 MED ORDER — LIDOCAINE HCL (PF) 1 % IJ SOLN
INTRAMUSCULAR | Status: AC
  Filled 2012-08-01: qty 30

## 2012-08-01 MED ORDER — HEPARIN SODIUM (PORCINE) 1000 UNIT/ML IJ SOLN
INTRAMUSCULAR | Status: AC
  Filled 2012-08-01: qty 1

## 2012-08-01 MED ORDER — VERAPAMIL HCL 2.5 MG/ML IV SOLN
INTRAVENOUS | Status: AC
  Filled 2012-08-01: qty 2

## 2012-08-01 MED ORDER — ASPIRIN EC 325 MG PO TBEC
325.0000 mg | DELAYED_RELEASE_TABLET | Freq: Every day | ORAL | Status: DC
  Administered 2012-08-01 – 2012-08-02 (×2): 325 mg via ORAL
  Filled 2012-08-01 (×2): qty 1

## 2012-08-01 MED ORDER — CLOPIDOGREL BISULFATE 75 MG PO TABS
75.0000 mg | ORAL_TABLET | Freq: Every day | ORAL | Status: DC
  Administered 2012-08-02: 75 mg via ORAL
  Filled 2012-08-01: qty 1

## 2012-08-01 MED ORDER — BIVALIRUDIN 250 MG IV SOLR
INTRAVENOUS | Status: AC
  Filled 2012-08-01: qty 250

## 2012-08-01 MED ORDER — GUAIFENESIN-DM 100-10 MG/5ML PO SYRP
5.0000 mL | ORAL_SOLUTION | ORAL | Status: DC | PRN
  Administered 2012-08-01: 5 mL via ORAL
  Filled 2012-08-01: qty 5

## 2012-08-01 MED ORDER — SODIUM CHLORIDE 0.9 % IJ SOLN: 3.0000 mL | INTRAMUSCULAR | Status: DC | PRN

## 2012-08-01 MED ORDER — NITROGLYCERIN 0.4 MG SL SUBL
SUBLINGUAL_TABLET | SUBLINGUAL | Status: AC
  Administered 2012-08-01: 0.4 mg via SUBLINGUAL
  Filled 2012-08-01: qty 25

## 2012-08-01 MED ORDER — MIDAZOLAM HCL 2 MG/2ML IJ SOLN
INTRAMUSCULAR | Status: AC
  Filled 2012-08-01: qty 2

## 2012-08-01 MED ORDER — SODIUM CHLORIDE 0.9 % IJ SOLN
3.0000 mL | Freq: Two times a day (BID) | INTRAMUSCULAR | Status: DC
  Administered 2012-08-02: 06:00:00 3 mL via INTRAVENOUS

## 2012-08-01 MED ORDER — FENTANYL CITRATE 0.05 MG/ML IJ SOLN
INTRAMUSCULAR | Status: AC
  Filled 2012-08-01: qty 2

## 2012-08-01 MED ORDER — HEPARIN (PORCINE) IN NACL 2-0.9 UNIT/ML-% IJ SOLN
INTRAMUSCULAR | Status: AC
  Filled 2012-08-01: qty 1000

## 2012-08-01 MED ORDER — CLOPIDOGREL BISULFATE 300 MG PO TABS
ORAL_TABLET | ORAL | Status: AC
  Filled 2012-08-01: qty 2

## 2012-08-01 MED ORDER — SODIUM CHLORIDE 0.9 % IV SOLN: 1.0000 mL/kg/h | INTRAVENOUS | Status: AC

## 2012-08-01 MED ORDER — SODIUM CHLORIDE 0.9 % IV SOLN: 250.0000 mL | INTRAVENOUS | Status: DC | PRN

## 2012-08-01 MED ORDER — SODIUM CHLORIDE 0.9 % IV SOLN
0.2500 mg/kg/h | INTRAVENOUS | Status: AC
  Filled 2012-08-01: qty 250

## 2012-08-01 NOTE — Progress Notes (Signed)
ANTICOAGULATION CONSULT NOTE - Initial Consult  Pharmacy Consult for Angiomax Indication: s/p PCI  No Known Allergies  Patient Measurements: Height: 5\' 4"  (162.6 cm) Weight: 175 lb 14.8 oz (79.8 kg) IBW/kg (Calculated) : 59.2  Vital Signs: Temp: 97.5 F (36.4 C) (04/17 1340) Temp src: Oral (04/17 1340) BP: 136/59 mmHg (04/17 1340) Pulse Rate: 62 (04/17 1340)  Labs:  Recent Labs  07/30/12 0814 07/30/12 1005 07/30/12 1631 07/30/12 2200 07/31/12 1722 08/01/12 0512 08/01/12 0824  HGB 13.2  --   --   --  13.7  --   --   HCT 40.9  --   --   --  41.5  --   --   PLT 119*  --   --   --  121*  --   --   LABPROT  --   --   --   --  12.4 12.7  --   INR  --   --   --   --  0.93 0.96  --   CREATININE 1.29  --   --   --  1.42*  --  1.46*  TROPONINI <0.30 <0.30 <0.30 <0.30  --   --   --     Estimated Creatinine Clearance: 33.3 ml/min (by C-G formula based on Cr of 1.46).   Medical History: Past Medical History  Diagnosis Date  . Duodenal ulcer May 2011    on EGD  . Helicobacter pylori gastritis MAY 2012 HP STOOL AG NEG  . GERD (gastroesophageal reflux disease)   . CAD (coronary artery disease)   . MI (myocardial infarction)   . Hyperlipidemia   . HTN (hypertension)   . Osteoarthritis   . S/P colonoscopy 2011    normal, internal hemorrhoids  . Presence of bare metal stent in LAD coronary artery - VerifFlex BMS 3.0 mm x 12 mm - post-dilated to 3.5 mm 08/01/2012    Assessment: Anthony Galvan with chest pain, s/p Cath today, successful PCI of mid LAD with BMS, plan for angiomax at reduced rate for 4 more hours post PCI and likely d/c home tomorrow morning on aspirin and plavix.    Plan:  - Continue Angiomax 0.25 mg/kg/hr (= 19.95mg /hr = 36ml/hr) until 1800  Maryanna Shape, PharmD, BCPS  Clinical Pharmacist  Pager: [phone]   08/01/2012,2:47 PM

## 2012-08-01 NOTE — Progress Notes (Signed)
I have seen and evaluated the patient this AM along with Tarri Fuller, PA. I agree with his findings, examination as well as impression recommendations.  No further angina, but notable DOE.  Diuresed overnight. Awaiting return of labs - to reassess renal function pre-cath. Will need to check LVEDP to determine need for diuresis to help with dyspnea, but if renal function is not stable, would avoid LV Gram.    Leonie Man, M.D., M.S. [facility] [address]. [place], [place]  [address]  [phone] Pager # [phone] 08/01/2012 8:38 AM

## 2012-08-01 NOTE — Progress Notes (Signed)
Patient complaining of 4/10 left chest pain.  BP 138/70.  Oxygen increased to 4 liters  and 1 SL NTG given with relief.  Awaiting cath lab.  Will continue to monitor.  Anthony Galvan

## 2012-08-01 NOTE — Progress Notes (Signed)
[facility]  Subjective: No CP.  Does get SOB with minimal exertion.  Objective: Vital signs in last 24 hours: Temp:  [97.8 F (36.6 C)-98.7 F (37.1 C)] 98.1 F (36.7 C) (04/17 0337) Pulse Rate:  [64-75] 68 (04/17 0337) Resp:  [14-20] 14 (04/16 1547) BP: (112-161)/(49-97) 112/49 mmHg (04/17 0337) SpO2:  [95 %-100 %] 95 % (04/17 0337) FiO2 (%):  [2 %] 2 % (04/16 1547) Last BM Date: 07/30/12  Intake/Output from previous day: 04/16 0701 - 04/17 0700 In: 840 [P.O.:840] Out: 2175 [Urine:2175] Intake/Output this shift:    Medications Current Facility-Administered Medications  Medication Dose Route Frequency Provider Last Rate Last Dose  . 0.9 %  sodium chloride infusion  250 mL Intravenous PRN Cecilie Kicks, NP      . 0.9 %  sodium chloride infusion  1 mL/kg/hr Intravenous Continuous Cecilie Kicks, NP 79.8 mL/hr at 08/01/12 0438 1 mL/kg/hr at 08/01/12 0438  . acetaminophen (TYLENOL) tablet 650 mg  650 mg Oral Q6H PRN Radene Gunning, NP   650 mg at 07/31/12 [id number]   Or  . acetaminophen (TYLENOL) suppository 650 mg  650 mg Rectal Q6H PRN Radene Gunning, NP      . alum & mag hydroxide-simeth (MAALOX/MYLANTA) 200-200-20 MG/5ML suspension 30 mL  30 mL Oral Q6H PRN Radene Gunning, NP      . aspirin EC tablet 81 mg  81 mg Oral Daily Radene Gunning, NP   81 mg at 07/31/12 1218  . atorvastatin (LIPITOR) tablet 80 mg  80 mg Oral q1800 Radene Gunning, NP   80 mg at 07/31/12 1752  . diazepam (VALIUM) tablet 2 mg  2 mg Oral On Call Cecilie Kicks, NP      . enoxaparin (LOVENOX) injection 40 mg  40 mg Subcutaneous Q24H Lezlie Octave Black, NP      . hydrALAZINE (APRESOLINE) tablet 20 mg  20 mg Oral BID Radene Gunning, NP   20 mg at 07/31/12 2154  . irbesartan (AVAPRO) tablet 300 mg  300 mg Oral Daily Lezlie Octave Black, NP   300 mg at 07/31/12 1218   And  . hydrochlorothiazide (MICROZIDE) capsule 12.5 mg  12.5 mg Oral Daily Radene Gunning, NP   12.5 mg at 07/31/12 1217  . metoprolol  succinate (TOPROL-XL) 24 hr tablet 100 mg  100 mg Oral Daily Radene Gunning, NP   100 mg at 07/31/12 1217  . morphine 2 MG/ML injection 2 mg  2 mg Intravenous Q1H PRN Radene Gunning, NP      . nitroGLYCERIN (NITROSTAT) SL tablet 0.4 mg  0.4 mg Sublingual Q5 min PRN Radene Gunning, NP      . ondansetron [facility]) tablet 4 mg  4 mg Oral Q6H PRN Radene Gunning, NP       Or  . ondansetron [facility]) injection 4 mg  4 mg Intravenous Q6H PRN Radene Gunning, NP      . pantoprazole (PROTONIX) EC tablet 40 mg  40 mg Oral Daily Radene Gunning, NP   40 mg at 07/31/12 1218  . sodium chloride 0.9 % injection 3 mL  3 mL Intravenous Q12H Radene Gunning, NP   3 mL at 07/31/12 2155  . sodium chloride 0.9 % injection 3 mL  3 mL Intravenous Q12H Cecilie Kicks, NP      . sodium chloride 0.9 % injection 3 mL  3 mL Intravenous PRN Cecilie Kicks, NP  PE: General appearance: alert, cooperative and no distress Lungs: Decreased BS particularly on the right.  right basilar rales.  No wheeze. Heart: regular rate and rhythm and soft 1/6 sys MM Extremities: Trace LEE Pulses: Radials 2+ and symmetric, 1+ PTs Skin: Warm and dry  Lab Results:   Recent Labs  07/30/12 0814 07/31/12 1722  WBC 8.0 7.8  HGB 13.2 13.7  HCT 40.9 41.5  PLT 119* 121*   BMET  Recent Labs  07/30/12 0814 07/31/12 1722  NA 137 137  K 4.3 4.6  CL 101 99  CO2 26 30  GLUCOSE 98 104*  BUN 22 18  CREATININE 1.29 1.42*  CALCIUM 9.2 9.2   PT/INR  Recent Labs  07/31/12 1722 08/01/12 0512  LABPROT 12.4 12.7  INR 0.93 0.96   Cholesterol  Recent Labs  07/30/12 1005  CHOL 231*   Lipid Panel     Component Value Date/Time   CHOL 231* 07/30/2012 1005   TRIG 165* 07/30/2012 1005   HDL 73 07/30/2012 1005   CHOLHDL 3.2 07/30/2012 1005   VLDL 33 07/30/2012 1005   LDLCALC 125* 07/30/2012 1005   Cardiac Panel (last 3 results)  Recent Labs  07/30/12 1005 07/30/12 1631 07/30/12 2200  TROPONINI <0.30 <0.30 <0.30    Assessment/Plan    Principal Problem:   Chest pain Active Problems:   HYPERLIPIDEMIA   HYPERTENSION   CORONARY ARTERY DISEASE   GERD  Plan:  Ruled out for MI.  Left heart cath with Dr. Ellyn Hack today.  BP and HR controlled and stable. LDL and triglycerides are elevated.  He is on lipitor 80mg  which he was not on prior to admission.  ASA, hydralazine, HCTZ, toprol XL 100   LOS: 2 days    Anthony Galvan 08/01/2012 8:04 AM

## 2012-08-01 NOTE — CV Procedure (Signed)
[facility] CARDIAC CATHETERIZATION AND PERCUTANEOUS CORONARY INTERVENTION REPORT  NAME:  Anthony Galvan   MRN: [id number] DOB:  [birth year]   ADMIT DATE: 07/30/2012 Procedure Date: 08/01/2012  INTERVENTIONAL CARDIOLOGIST: Leonie Man, M.D., MS PRIMARY CARE PROVIDER: Maggie Font, MD PRIMARY CARDIOLOGIST: Rebecca Eaton., MD  PATIENT:  Anthony Galvan is a 79 y.o. male with a long history of CAD - with interventions in [place], but normal cath in 2003 & 2008 x 2.  He was seen in consultation at [facility] by Dr. Rollene Fare after being admitted for CP.  He initially ruled out for MI & was planned for OP f/u, but he had recurrent CP & DOE on the AM prior to Cardiology consultation.  Dr. Rollene Fare felt the SSx were concerning for Unstable angina & referred him for cardiac catheterization.  CP & DOE with minimal exertion.  No evidence of CHF on lab evaluation.  PRE-OPERATIVE DIAGNOSIS:    Unstable Angina  PROCEDURES PERFORMED:    Left Heart Catheterization with Native Coronary Angiography via 5 Fr Right Radial Access  Percutaneous Coronary Intervention on the Mid LAD (focal 99% lesion) just proximal to small D1 - Veriflex BMS 3.0 mm x 12 mm - post-dilated to 3.55 mm.  PROCEDURE:Consent:  Risks of procedure as well as the alternatives and risks of each were explained to the (patient/caregiver).  Consent for procedure obtained. Consent for signed by MD and patient with RN witness -- placed on chart.   PROCEDURE: The patient was brought to the 2nd [facility] in the fasting state and prepped and draped in the usual sterile fashion for Right groin or radial access. A modified Allen's test with plethysmography was performed, revealing excellent Ulnar artery collateral flow.  Sterile technique was used including antiseptics, cap, gloves, gown, hand hygiene, mask and sheet.  Skin prep: Chlorhexidine.  Time Out: Verified patient  identification, verified procedure, site/side was marked, verified correct patient position, special equipment/implants available, medications/allergies/relevent history reviewed, required imaging and test results available.  Performed  Access: Right Radial Artery; 5 Fr Glide Sheath -- Seldinger technique using an Angiocath Micropuncture Kit  IA Radial Cocktail, IV Heparin 4000 Units Diagnostic:  TIG 4.0 advanced over Versicore wire, exchanged over Long exchange Safety-J wire.  Left Coronary Artery Angiography: TIG 4.0  Right Coronary Artery Angiography: TIG 4.0  LV Hemodynamics (LV Gram): TIG 4.0  TR Band:  1230 Hours, 15 mL air  MEDICATIONS:  Anesthesia:  Local Lidocaine 2 ml  Sedation:  1 mg IV Versed, 25 mcg IV fentanyl ;   Omnipaque Contrast: 100 ml  Anticoagulation:  IV Heparin 4000 Units ; Angiomax Bolus & drip for PCI -- drip reduced to low rate for 4 hrs post cath  Anti-Platelet Agent:  Plavix 600 mg   Hemodynamics:  Central Aortic / Mean Pressures: 116/53 mmHg; 79 mmHg  Left Ventricular Pressures / EDP: 115/5 mmHg; 12 mmHg  Left Ventriculography: Not done to conserve contrast  Coronary Anatomy:  Left Main: Large caliber vessel that bi LAD: Large caliber vessel with a focal 99% "apple-core" lesion in the early mid vessel (LESION #1), followed by diffuse mild luminal irregularities as it takes a tortuous course to the inferoapex with several small septal perforators and diagonal branches.  D1: See Ramus  D2: Small caliber vessel that arises just beyond the mid LAD lesion, ostial ~70% stenosis.  Left Circumflex: Large, dominant vessel.  OM1: small to moderate caliber, minimal luminal irregularities  LPL: small to moderate caliber, minimal luminal irregularities  LPDA: as the distal Circumflex courses in the posterior AV groove, there is a ~ 50% tubular stenosis followed by minimal luminal irregularities throughout the moderate caliber LPDA Ramus Intermedius /  Diagonal 1: moderate to large caliber vessel, no significant disease  RCA: Small (<2.0 mm), non-dominant RCA, diffuse mild to moderate luminal irregularities; proximal ~60-70%  Review of angiographic images clearly indicates the culprit lesion to be the mid LAD 99% lesion.  Plans were made to proceed directly to PCI with BMS due to the focal nature of the lesion in a relatively large caliber vessel, and the patient's history of PUD and GI Bleed.  Percutaneous Coronary Intervention:  Sheath exchanged for 6 Fr Guide: 6 Fr   XB LAD 3.5 Guidewire: BMW Predilation Balloon: Trek 2.5 mm x 8 mm;   6 Atm x 30 Sec, 10 Atm x 30 Sec Stent: VeriFlex BMS 3.0 mm x 12 mm;   12 Atm x 30 Sec, 18 Atm x 45 Sec Post-dilation Balloon: [place] Trek 3.5 mm x 8 mm;   14 Atm x 45 Sec, Final Diameter 3.55 mm  Ostium of D2 - ~80% ostial stenosis, but TIMI 3 flow, too small for PTCA  Post deployment angiography in multiple views, with and without guidewire in place revealed excellent stent deployment and lesion coverage.  There was no evidence of dissection or perforation.  PATIENT DISPOSITION:    The patient was transferred to the PACU holding area in a hemodynamicaly stable, chest pain free condition.  The patient tolerated the procedure well, and there were no complications.  EBL:   < 10 ml  The patient was stable before, during, and after the procedure.  POST-OPERATIVE DIAGNOSIS:    Severe single vessel CAD - 95-99% focal mid LAD lesion in a Left Dominant system  Successful PCI of mid LAD with a VeriFlex BMS 3.0 mm x 12 mm - post-dilated to 3.5 mm  BMS used due to history of PUD and Melana.  Normal LVEDP  PLAN OF CARE:  Standard post radial cath care for PCI.    Will run Angiomax at reduced rate x 4 hr post PCI  ASA & Plavix for minimum of 1 month, but would continue for 1 yr based upon PCI-CURE data for ACS  Continue to titrate cardiac, BP medications  Will order Echocardiogram for EF  assessment  Anticipate d/c in AM to f/u with Dr. Rollene Fare (either in [place] or at [facility] office).   Leonie Man, M.D., M.S. [facility] [address] [place],   [address]  [phone]  08/01/2012 11:27 AM

## 2012-08-02 LAB — BASIC METABOLIC PANEL
BUN: 18 mg/dL (ref 6–23)
CO2: 28 mEq/L (ref 19–32)
Calcium: 9 mg/dL (ref 8.4–10.5)
Creatinine, Ser: 1.33 mg/dL (ref 0.50–1.35)
Glucose, Bld: 89 mg/dL (ref 70–99)
Sodium: 137 mEq/L (ref 135–145)

## 2012-08-02 LAB — CBC
HCT: 40.1 % (ref 39.0–52.0)
Hemoglobin: 13 g/dL (ref 13.0–17.0)
MCH: 28.6 pg (ref 26.0–34.0)
MCV: 88.1 fL (ref 78.0–100.0)
RBC: 4.55 MIL/uL (ref 4.22–5.81)

## 2012-08-02 MED ORDER — ATORVASTATIN CALCIUM 80 MG PO TABS: 80.0000 mg | ORAL_TABLET | Freq: Every day | ORAL | Status: DC

## 2012-08-02 MED ORDER — ASPIRIN 325 MG PO TBEC: 325.0000 mg | DELAYED_RELEASE_TABLET | Freq: Every day | ORAL | Status: DC

## 2012-08-02 MED ORDER — CLOPIDOGREL BISULFATE 75 MG PO TABS: 75.0000 mg | ORAL_TABLET | Freq: Every day | ORAL | Status: DC

## 2012-08-02 MED ORDER — NITROGLYCERIN 0.4 MG SL SUBL: 0.4000 mg | SUBLINGUAL_TABLET | SUBLINGUAL | Status: DC | PRN

## 2012-08-02 MED FILL — Sodium Chloride IV Soln 0.9%: INTRAVENOUS | Qty: 50 | Status: AC

## 2012-08-02 NOTE — Discharge Summary (Signed)
Physician Discharge Summary  Patient ID: Anthony Galvan MRN: [id number] DOB/AGE: [birth year] 79 y.o.  Admit date: 07/30/2012 Discharge date: 08/02/2012  Admission Diagnoses: Unstable angina  Discharge Diagnoses:  Principal Problem:   Unstable angina Active Problems:   HYPERLIPIDEMIA   HYPERTENSION   CORONARY ARTERY DISEASE   GERD   Presence of bare metal stent in LAD coronary artery - VerifFlex BMS 3.0 mm x 12 mm - post-dilated to 3.5 mm   Discharged Condition: stable  Hospital Course:   Anthony Galvan is a 79 y.o. male with a long history of CAD - with interventions in [place], but normal cath in 2003 & 2008 x 2. He was seen in consultation at [facility] by Dr. Rollene Fare after being admitted for CP. He initially ruled out for MI & was planned for OP f/u, but he had recurrent CP & DOE on the AM prior to Cardiology consultation. Dr. Rollene Fare felt the SSx were concerning for Unstable angina & referred him for cardiac catheterization. CP & DOE with minimal exertion. No evidence of CHF on lab evaluation.  Left heart cath revealed severe single vessel CAD - 95-99% focal mid LAD lesion in a left dominant system.  He had successful PCI of mid LAD with a VeriFlex BMS 3.0 mm x 12 mm - post-dilated to 3.5 mm.  BMS used due to history of PUD and Melana.  He was started on lipitor 80mg  daily.  EF by echo 07/30/12 is 60-65% with no WMA and grade one diastolic, mild to moderate LVH. ASA, plavix, hydralazine, HCTZ, irbesartan, Toprol XL 100.  Consider checking P2Y12 as OP.  He was discharged home in stable condition after being seen by Dr. Ellyn Hack.  Follow up with Dr. Rollene Fare has been arranged.  Consults: None  Significant Diagnostic Studies:  07/30/12, 2D Echo  Study Conclusions  - Left ventricle: The cavity size was normal. Wall thickness was increased increased in a pattern of mild to moderate LVH. Systolic function was normal. The estimated ejection fraction was in the range of 60% to 65%. Wall  motion was normal; there were no regional wall motion abnormalities. Doppler parameters are consistent with abnormal left ventricular relaxation (grade 1 diastolic dysfunction). Doppler parameters are consistent with elevated ventricular end-diastolic filling pressure. No previous study for comparison. - Aortic valve: Mildly calcified annulus. Trileaflet. Trivial regurgitation. - Mitral valve: Calcified annulus. Trivial regurgitation. - Right atrium: Central venous pressure: 90mm Hg (est). - Tricuspid valve: Trivial regurgitation. - Pulmonary arteries: Systolic pressure could not be accurately estimated. - Pericardium, extracardiac: There was no pericardial effusion.   Coronary angiogram and PCI Access: Right Radial Artery; 5 Fr Glide Sheath -- Seldinger technique using an Angiocath Micropuncture Kit  IA Radial Cocktail, IV Heparin 4000 Units Diagnostic: TIG 4.0 advanced over Versicore wire, exchanged over Long exchange Safety-J wire.  Left Coronary Artery Angiography: TIG 4.0  Right Coronary Artery Angiography: TIG 4.0  LV Hemodynamics (LV Gram): TIG 4.0 TR Band: 1230 Hours, 15 mL air  MEDICATIONS:  Anesthesia: Local Lidocaine 2 ml Sedation: 1 mg IV Versed, 25 mcg IV fentanyl ;  Omnipaque Contrast: 100 ml  Anticoagulation: IV Heparin 4000 Units ; Angiomax Bolus & drip for PCI -- drip reduced to low rate for 4 hrs post cath  Anti-Platelet Agent: Plavix 600 mg  Hemodynamics:  Central Aortic / Mean Pressures: 116/53 mmHg; 79 mmHg  Left Ventricular Pressures / EDP: 115/5 mmHg; 12 mmHg Left Ventriculography: Not done to conserve contrast  Coronary Anatomy:  Left  Main: Large caliber vessel that bi LAD: Large caliber vessel with a focal 99% "apple-core" lesion in the early mid vessel (LESION #1), followed by diffuse mild luminal irregularities as it takes a tortuous course to the inferoapex with several small septal perforators and diagonal branches.  D1: See Ramus  D2: Small caliber  vessel that arises just beyond the mid LAD lesion, ostial ~70% stenosis.  Left Circumflex: Large, dominant vessel.  OM1: small to moderate caliber, minimal luminal irregularities  LPL: small to moderate caliber, minimal luminal irregularities  LPDA: as the distal Circumflex courses in the posterior AV groove, there is a ~ 50% tubular stenosis followed by minimal luminal irregularities throughout the moderate caliber LPDA Ramus Intermedius / Diagonal 1: moderate to large caliber vessel, no significant disease  RCA: Small (<2.0 mm), non-dominant RCA, diffuse mild to moderate luminal irregularities; proximal ~60-70% Review of angiographic images clearly indicates the culprit lesion to be the mid LAD 99% lesion. Plans were made to proceed directly to PCI with BMS due to the focal nature of the lesion in a relatively large caliber vessel, and the patient's history of PUD and GI Bleed.  Percutaneous Coronary Intervention: Sheath exchanged for 6 Fr  Guide: 6 Fr XB LAD 3.5 Guidewire: BMW  Predilation Balloon: Trek 2.5 mm x 8 mm;  6 Atm x 30 Sec, 10 Atm x 30 Sec Stent: VeriFlex BMS 3.0 mm x 12 mm;  12 Atm x 30 Sec, 18 Atm x 45 Sec Post-dilation Balloon: [place] Trek 3.5 mm x 8 mm;  14 Atm x 45 Sec, Final Diameter 3.55 mm  Ostium of D2 - ~80% ostial stenosis, but TIMI 3 flow, too small for PTCA Post deployment angiography in multiple views, with and without guidewire in place revealed excellent stent deployment and lesion coverage. There was no evidence of dissection or perforation.  PATIENT DISPOSITION:  The patient was transferred to the PACU holding area in a hemodynamicaly stable, chest pain free condition.  The patient tolerated the procedure well, and there were no complications. EBL: < 10 ml  The patient was stable before, during, and after the procedure. POST-OPERATIVE DIAGNOSIS:  Severe single vessel CAD - 95-99% focal mid LAD lesion in a Left Dominant system  Successful PCI of mid LAD with a  VeriFlex BMS 3.0 mm x 12 mm - post-dilated to 3.5 mm  BMS used due to history of PUD and Melana. Normal LVEDP PLAN OF CARE:  Standard post radial cath care for PCI.  Will run Angiomax at reduced rate x 4 hr post PCI  ASA & Plavix for minimum of 1 month, but would continue for 1 yr based upon PCI-CURE data for ACS  Continue to titrate cardiac, BP medications  Will order Echocardiogram for EF assessment  Anticipate d/c in AM to f/u with Dr. Rollene Fare (either in [place] or at [facility] office). Leonie Man, M.D., M.S.  [facility]  [address] [place], [place] [address]  [phone]  08/01/2012  11:27 AM   CBC    Component Value Date/Time   WBC 10.6* 08/02/2012 0500   RBC 4.55 08/02/2012 0500   HGB 13.0 08/02/2012 0500   HCT 40.1 08/02/2012 0500   PLT 130* 08/02/2012 0500   MCV 88.1 08/02/2012 0500   MCH 28.6 08/02/2012 0500   MCHC 32.4 08/02/2012 0500   RDW 11.6 08/02/2012 0500   LYMPHSABS 2.8 07/30/2012 0814   MONOABS 0.5 07/30/2012 0814   EOSABS 0.3 07/30/2012 [id number]  BASOSABS 0.0 07/30/2012 0814    BMET    Component Value Date/Time   NA 137 08/02/2012 0500   K 4.4 08/02/2012 0500   CL 100 08/02/2012 0500   CO2 28 08/02/2012 0500   GLUCOSE 89 08/02/2012 0500   BUN 18 08/02/2012 0500   CREATININE 1.33 08/02/2012 0500   CALCIUM 9.0 08/02/2012 0500   GFRNONAA 46* 08/02/2012 0500   GFRAA 53* 08/02/2012 0500    Treatments:  See above.  Discharge Exam: Blood pressure 198/60, pulse 70, temperature 98.8 F (37.1 C), temperature source Oral, resp. rate 16, height 5\' 4"  (1.626 m), weight 79.8 kg (175 lb 14.8 oz), SpO2 97.00%.   Disposition:   Discharge Orders   Future Orders Complete By Expires     Amb Referral to Cardiac Rehabilitation  As directed     Diet - low sodium heart healthy  As directed     Discharge instructions  As directed     Comments:      No lifting with your right arm for two days.    Increase activity slowly  As  directed         Medication List    STOP taking these medications       aspirin 81 MG tablet      TAKE these medications       aspirin 325 MG EC tablet  Take 1 tablet (325 mg total) by mouth daily.     atorvastatin 80 MG tablet  Commonly known as:  LIPITOR  Take 1 tablet (80 mg total) by mouth daily at 6 PM.     clopidogrel 75 MG tablet  Commonly known as:  PLAVIX  Take 1 tablet (75 mg total) by mouth daily with breakfast.     hydrALAZINE 10 MG tablet  Commonly known as:  APRESOLINE  Take 20 mg by mouth 2 (two) times daily.     metoprolol succinate 100 MG 24 hr tablet  Commonly known as:  TOPROL-XL  Take 100 mg by mouth daily.     nitroGLYCERIN 0.4 MG SL tablet  Commonly known as:  NITROSTAT  Place 1 tablet (0.4 mg total) under the tongue every 5 (five) minutes as needed for chest pain.     pantoprazole 40 MG tablet  Commonly known as:  PROTONIX  Take 1 tablet (40 mg total) by mouth daily.     valsartan-hydrochlorothiazide 320-12.5 MG per tablet  Commonly known as:  DIOVAN-HCT  Take 1 tablet by mouth daily.         SignedTarri Fuller 08/02/2012, 9:53 AM  I saw and examined the patient along with Tarri Fuller, PA on the day of discahrge. I agree with Bryan's summary.  Admitted for Unstable angina, found to have severe mid LAD lesion treated with BMS PCI.  He looks & feels remarkably well this AM. Up & walking without CP or dyspnea.  BP is up a bit, but has been relatively stable. Is on a good cardiac regimen, may be able to up-titrate Hydralazine as OP if he remains significantly hypertensive.  DAPT x minimum of 1 month with BMS, but best-practice would be 1 yr with PCI in setting of ACS/ Unstable Angina.   Leonie Man, M.D., M.S. [facility] [address]. [place], [place]  [address]  [phone] Pager # [phone] 08/06/2012 1:22 PM

## 2012-08-02 NOTE — Progress Notes (Signed)
CARDIAC REHAB PHASE I   PRE:  Rate/Rhythm: 66SR  BP:  Supine:   Sitting: 177/67  Standing:    SaO2:   MODE:  Ambulation: 700 ft   POST:  Rate/Rhythm: 92SR PVCs  BP:  Supine:   Sitting: 198/60 dinamapp, 160/70 manual  Standing:    SaO2:  [id number] Pt walked 700 ft with hand held asst with steady gait. Tolerated well. No CP. To recliner after walk. Education completed. Discussed CRP 2 and pt gave permission to refer to [facility].   Graylon Good, RN BSN  08/02/2012 9:43 AM

## 2012-08-02 NOTE — Progress Notes (Signed)
[facility]  Subjective: No CP, or SOB  Objective: Vital signs in last 24 hours: Temp:  [97.4 F (36.3 C)-98.5 F (36.9 C)] 98.1 F (36.7 C) (04/18 0500) Pulse Rate:  [57-74] 69 (04/18 0500) Resp:  [14-17] 16 (04/18 0500) BP: (110-154)/(51-70) 148/65 mmHg (04/18 0500) SpO2:  [93 %-99 %] 95 % (04/18 0500) Last BM Date: 07/30/12  Intake/Output from previous day: 04/17 0701 - 04/18 0700 In: 1746.2 [P.O.:480; I.V.:1266.2] Out: 1475 [Urine:1475] Intake/Output this shift:    Medications Current Facility-Administered Medications  Medication Dose Route Frequency Provider Last Rate Last Dose  . 0.9 %  sodium chloride infusion  250 mL Intravenous PRN Leonie Man, MD      . acetaminophen (TYLENOL) tablet 650 mg  650 mg Oral Q6H PRN Radene Gunning, NP   650 mg at 07/31/12 [id number]   Or  . acetaminophen (TYLENOL) suppository 650 mg  650 mg Rectal Q6H PRN Radene Gunning, NP      . alum & mag hydroxide-simeth (MAALOX/MYLANTA) 200-200-20 MG/5ML suspension 30 mL  30 mL Oral Q6H PRN Radene Gunning, NP      . aspirin EC tablet 325 mg  325 mg Oral Daily Leonie Man, MD   325 mg at 08/01/12 2217  . atorvastatin (LIPITOR) tablet 80 mg  80 mg Oral q1800 Radene Gunning, NP   80 mg at 08/01/12 2217  . clopidogrel (PLAVIX) tablet 75 mg  75 mg Oral Q breakfast Leonie Man, MD      . enoxaparin (LOVENOX) injection 40 mg  40 mg Subcutaneous Q24H Lezlie Octave Black, NP      . guaiFENesin-dextromethorphan [facility] DM) 100-10 MG/5ML syrup 5 mL  5 mL Oral Q4H PRN Leonie Man, MD   5 mL at 08/01/12 2217  . hydrALAZINE (APRESOLINE) tablet 20 mg  20 mg Oral BID Radene Gunning, NP   20 mg at 08/01/12 2217  . irbesartan (AVAPRO) tablet 300 mg  300 mg Oral Daily Lezlie Octave Black, NP   300 mg at 08/01/12 1009   And  . hydrochlorothiazide (MICROZIDE) capsule 12.5 mg  12.5 mg Oral Daily Lezlie Octave Black, NP   12.5 mg at 08/01/12 1009  . metoprolol succinate (TOPROL-XL) 24 hr tablet 100  mg  100 mg Oral Daily Lezlie Octave Black, NP   100 mg at 08/01/12 1009  . morphine 2 MG/ML injection 2 mg  2 mg Intravenous Q1H PRN Radene Gunning, NP      . nitroGLYCERIN (NITROSTAT) SL tablet 0.4 mg  0.4 mg Sublingual Q5 min PRN Radene Gunning, NP   0.4 mg at 08/01/12 1009  . ondansetron (ZOFRAN) tablet 4 mg  4 mg Oral Q6H PRN Radene Gunning, NP       Or  . ondansetron [facility]) injection 4 mg  4 mg Intravenous Q6H PRN Radene Gunning, NP      . pantoprazole (PROTONIX) EC tablet 40 mg  40 mg Oral Daily Lezlie Octave Black, NP   40 mg at 08/01/12 1009  . sodium chloride 0.9 % injection 3 mL  3 mL Intravenous Q12H Lezlie Octave Black, NP   3 mL at 08/01/12 1016  . sodium chloride 0.9 % injection 3 mL  3 mL Intravenous Q12H Leonie Man, MD   3 mL at 08/02/12 0609  . sodium chloride 0.9 % injection 3 mL  3 mL Intravenous PRN Leonie Man, MD  PE: General appearance: alert, cooperative and no distress Lungs: Basilar crackles.  no wheeze Heart: regular rate and rhythm and 1/6 sys mm Extremities: trace LEE Pulses: 2+ and symmetric Skin: right wrist cath site.  No errythema, ecchymosis or edema. Neurologic: Grossly normal  Lab Results:   Recent Labs  07/30/12 0814 07/31/12 1722 08/02/12 0500  WBC 8.0 7.8 10.6*  HGB 13.2 13.7 13.0  HCT 40.9 41.5 40.1  PLT 119* 121* 130*   BMET  Recent Labs  07/31/12 1722 08/01/12 0824 08/02/12 0500  NA 137 137 137  K 4.6 4.5 4.4  CL 99 102 100  CO2 30 31 28   GLUCOSE 104* 87 89  BUN 18 22 18   CREATININE 1.42* 1.46* 1.33  CALCIUM 9.2 8.9 9.0   PT/INR  Recent Labs  07/31/12 1722 08/01/12 0512  LABPROT 12.4 12.7  INR 0.93 0.96   Cholesterol  Recent Labs  07/30/12 1005  CHOL 231*   Lipid Panel     Component Value Date/Time   CHOL 231* 07/30/2012 1005   TRIG 165* 07/30/2012 1005   HDL 73 07/30/2012 1005   CHOLHDL 3.2 07/30/2012 1005   VLDL 33 07/30/2012 1005   LDLCALC 125* 07/30/2012 1005   Access: Right Radial Artery; 5 Fr Glide Sheath  -- Seldinger technique using an Angiocath Micropuncture Kit  IA Radial Cocktail, IV Heparin 4000 Units Diagnostic: TIG 4.0 advanced over Versicore wire, exchanged over Long exchange Safety-J wire.  Left Coronary Artery Angiography: TIG 4.0  Right Coronary Artery Angiography: TIG 4.0  LV Hemodynamics (LV Gram): TIG 4.0 TR Band: 1230 Hours, 15 mL air  MEDICATIONS:  Anesthesia: Local Lidocaine 2 ml Sedation: 1 mg IV Versed, 25 mcg IV fentanyl ;  Omnipaque Contrast: 100 ml  Anticoagulation: IV Heparin 4000 Units ; Angiomax Bolus & drip for PCI -- drip reduced to low rate for 4 hrs post cath  Anti-Platelet Agent: Plavix 600 mg  Hemodynamics:  Central Aortic / Mean Pressures: 116/53 mmHg; 79 mmHg  Left Ventricular Pressures / EDP: 115/5 mmHg; 12 mmHg Left Ventriculography: Not done to conserve contrast  Coronary Anatomy:  Left Main: Large caliber vessel that bi LAD: Large caliber vessel with a focal 99% "apple-core" lesion in the early mid vessel (LESION #1), followed by diffuse mild luminal irregularities as it takes a tortuous course to the inferoapex with several small septal perforators and diagonal branches.  D1: See Ramus  D2: Small caliber vessel that arises just beyond the mid LAD lesion, ostial ~70% stenosis.  Left Circumflex: Large, dominant vessel.  OM1: small to moderate caliber, minimal luminal irregularities  LPL: small to moderate caliber, minimal luminal irregularities  LPDA: as the distal Circumflex courses in the posterior AV groove, there is a ~ 50% tubular stenosis followed by minimal luminal irregularities throughout the moderate caliber LPDA Ramus Intermedius / Diagonal 1: moderate to large caliber vessel, no significant disease  RCA: Small (<2.0 mm), non-dominant RCA, diffuse mild to moderate luminal irregularities; proximal ~60-70% Review of angiographic images clearly indicates the culprit lesion to be the mid LAD 99% lesion. Plans were made to proceed directly to PCI  with BMS due to the focal nature of the lesion in a relatively large caliber vessel, and the patient's history of PUD and GI Bleed.  Percutaneous Coronary Intervention: Sheath exchanged for 6 Fr  Guide: 6 Fr XB LAD 3.5 Guidewire: BMW  Predilation Balloon: Trek 2.5 mm x 8 mm;  6 Atm x 30 Sec, 10 Atm x 30 Sec Stent:  VeriFlex BMS 3.0 mm x 12 mm;  12 Atm x 30 Sec, 18 Atm x 45 Sec Post-dilation Balloon: [place] Trek 3.5 mm x 8 mm;  14 Atm x 45 Sec, Final Diameter 3.55 mm  Ostium of D2 - ~80% ostial stenosis, but TIMI 3 flow, too small for PTCA Post deployment angiography in multiple views, with and without guidewire in place revealed excellent stent deployment and lesion coverage. There was no evidence of dissection or perforation.  PATIENT DISPOSITION:  The patient was transferred to the PACU holding area in a hemodynamicaly stable, chest pain free condition.  The patient tolerated the procedure well, and there were no complications. EBL: < 10 ml  The patient was stable before, during, and after the procedure. POST-OPERATIVE DIAGNOSIS:  Severe single vessel CAD - 95-99% focal mid LAD lesion in a Left Dominant system  Successful PCI of mid LAD with a VeriFlex BMS 3.0 mm x 12 mm - post-dilated to 3.5 mm  BMS used due to history of PUD and Melana. Normal LVEDP PLAN OF CARE:  Standard post radial cath care for PCI.  Will run Angiomax at reduced rate x 4 hr post PCI  ASA & Plavix for minimum of 1 month, but would continue for 1 yr based upon PCI-CURE data for ACS  Continue to titrate cardiac, BP medications  Will order Echocardiogram for EF assessment  Anticipate d/c in AM to f/u with Dr. Rollene Fare (either in [place] or at [facility] office). Leonie Man, M.D., M.S.  [facility]  [address] [place], [place] [address]  [phone]  08/01/2012  11:27 AM  07/30/12, 2D echo Study Conclusions  - Left ventricle: The cavity size was normal. Wall  thickness was increased increased in a pattern of mild to moderate LVH. Systolic function was normal. The estimated ejection fraction was in the range of 60% to 65%. Wall motion was normal; there were no regional wall motion abnormalities. Doppler parameters are consistent with abnormal left ventricular relaxation (grade 1 diastolic dysfunction). Doppler parameters are consistent with elevated ventricular end-diastolic filling pressure. No previous study for comparison. - Aortic valve: Mildly calcified annulus. Trileaflet. Trivial regurgitation. - Mitral valve: Calcified annulus. Trivial regurgitation. - Right atrium: Central venous pressure: 45mm Hg (est). - Tricuspid valve: Trivial regurgitation. - Pulmonary arteries: Systolic pressure could not be accurately estimated. - Pericardium, extracardiac: There was no pericardial effusion.   Assessment/Plan   Principal Problem:   Unstable angina Active Problems:   HYPERLIPIDEMIA   HYPERTENSION   CORONARY ARTERY DISEASE   GERD   Presence of bare metal stent in LAD coronary artery - VerifFlex BMS 3.0 mm x 12 mm - post-dilated to 3.5 mm  Plan:  SP lHC revealing 95-99% focal mid LAD lesion in a left dominant system. Veriflex BMS inserted.  EF by echo 07/30/12 is 60-65% with no WMA and  grade one diastolic, mild to moderate LVH.  ASA, lipitor, plavix, hydralazine, HCTZ, irbesartan, Toprol XL 100.   Consider checking P2Y12 as OP.  DC home today.     LOS: 3 days    HAGER, BRYAN 08/02/2012 7:58 AM  I have seen and evaluated the patient this AM along with Tarri Fuller, PA. I agree with Bryan's findings, examination as well as impression recommendations.  He looks & feels remarkably well this AM.  Up & walking without CP or dyspnea.    BP is up a bit, but has been relatively stable.  Is on a good cardiac  regimen, may be able to up-titrate Hydralazine as OP if he remains significantly hypertensive.  DAPT x minimum of 1 month with BMS, but  best-practice would be 1 yr with PCI in setting of ACS/ Unstable Angina.    Leonie Man, M.D., M.S. [facility] [address] [place], [place]  [address]  [phone] Pager # [phone] 08/02/2012 8:28 AM

## 2012-08-02 NOTE — Progress Notes (Signed)
TR BAND REMOVAL  LOCATION:    right radial  DEFLATED PER PROTOCOL:    yes  TIME BAND OFF / DRESSING APPLIED:    21:00   SITE UPON ARRIVAL:    Level 0  SITE AFTER BAND REMOVAL:    Level 0  REVERSE ALLEN'S TEST:     positive  CIRCULATION SENSATION AND MOVEMENT:    Within Normal Limits   yes  COMMENTS:   Pt tolerated the removal of TR band without problems, will continue to monitor patient

## 2012-08-15 ENCOUNTER — Other Ambulatory Visit (HOSPITAL_COMMUNITY): Payer: Self-pay | Admitting: Cardiovascular Disease

## 2012-08-15 DIAGNOSIS — I701 Atherosclerosis of renal artery: Secondary | ICD-10-CM

## 2012-08-27 ENCOUNTER — Ambulatory Visit (HOSPITAL_COMMUNITY)
Admission: RE | Admit: 2012-08-27 | Discharge: 2012-08-27 | Disposition: A | Discharge status: Home / Self Care | Payer: Medicare Other | Source: Ambulatory Visit | Attending: Cardiovascular Disease | Admitting: Cardiovascular Disease

## 2012-08-27 DIAGNOSIS — I701 Atherosclerosis of renal artery: Secondary | ICD-10-CM | POA: Insufficient documentation

## 2012-08-27 NOTE — Progress Notes (Signed)
Renal artery duplex doppler was completed. Aris Georgia RVT

## 2012-09-03 ENCOUNTER — Encounter (HOSPITAL_COMMUNITY): Payer: Self-pay

## 2012-09-03 ENCOUNTER — Encounter (HOSPITAL_COMMUNITY)
Admission: RE | Admit: 2012-09-03 | Discharge: 2012-09-03 | Disposition: A | Discharge status: Home / Self Care | Payer: Medicare Other | Source: Ambulatory Visit | Attending: Cardiology | Admitting: Cardiology

## 2012-09-03 VITALS — BP 132/70 | HR 88 | Ht 64.0 in | Wt 172.9 lb

## 2012-09-03 DIAGNOSIS — Z9861 Coronary angioplasty status: Secondary | ICD-10-CM

## 2012-09-03 NOTE — Patient Instructions (Signed)
Pt has finished orientation and is scheduled to start CR on ________ at _______. Pt has been instructed to arrive to class 15 minutes early for scheduled class. Pt has been instructed to wear comfortable clothing and shoes with rubber soles. Pt has been told to take their medications 1 hour prior to coming to class.  If the patient is not going to attend class, he/she has been instructed to call.

## 2012-09-03 NOTE — Progress Notes (Signed)
Dr. Glenetta Hew sent referral due to Stentx1 placement v45.82. During orientation advised patient on arrival and appointment times what to wear, what to do before, during and after exercise. Reviewed attendance and class policy. Talked about inclement weather and class consultation policy. Pt is scheduled to start Cardiac Rehab on _______at _______. Pt was advised to come to class 5 minutes before class starts. He was also given instructions on meeting with the dietician and attending the Family Structure classes. Pt is eager to get started.

## 2012-10-25 ENCOUNTER — Other Ambulatory Visit: Payer: Self-pay | Admitting: Cardiovascular Disease

## 2012-10-25 LAB — CBC WITH DIFFERENTIAL/PLATELET
Basophils Relative: 1 % (ref 0–1)
Eosinophils Absolute: 0.3 10*3/uL (ref 0.0–0.7)
Lymphs Abs: 2.9 10*3/uL (ref 0.7–4.0)
MCH: 28.4 pg (ref 26.0–34.0)
Neutrophils Relative %: 60 % (ref 43–77)
Platelets: 130 10*3/uL — ABNORMAL LOW (ref 150–400)
RBC: 4.55 MIL/uL (ref 4.22–5.81)

## 2012-10-25 LAB — COMPREHENSIVE METABOLIC PANEL
ALT: 16 U/L (ref 0–53)
AST: 18 U/L (ref 0–37)
Alkaline Phosphatase: 67 U/L (ref 39–117)
Sodium: 139 mEq/L (ref 135–145)
Total Bilirubin: 1.4 mg/dL — ABNORMAL HIGH (ref 0.3–1.2)
Total Protein: 6.9 g/dL (ref 6.0–8.3)

## 2012-10-25 LAB — LIPID PANEL
LDL Cholesterol: 58 mg/dL (ref 0–99)
VLDL: 32 mg/dL (ref 0–40)

## 2012-10-26 ENCOUNTER — Encounter: Payer: Self-pay | Admitting: Cardiovascular Disease

## 2012-10-29 ENCOUNTER — Telehealth: Payer: Self-pay | Admitting: Cardiovascular Disease

## 2012-10-29 NOTE — Telephone Encounter (Signed)
Mr.Rotenberry left a message with the answering service wanting to know something about his blood work.  Thanks

## 2012-10-29 NOTE — Telephone Encounter (Signed)
Returned call.  Pt stated he had a missed call from our office.  Pt stated he doesn't have an answering service and thought the call may be r/t his lab results.  Pt informed no results in chart and RN will notify JC, LPN w/ Dr. Rollene Fare to find out what call was r/t and call back.  Pt verbalized understanding and agreed w/ plan.    Message forwarded to [facility]. Tressie Stalker, LPN.

## 2012-12-04 ENCOUNTER — Telehealth: Payer: Self-pay | Admitting: Cardiology

## 2012-12-04 NOTE — Telephone Encounter (Signed)
This message was on my answering machine from yesterday-Pt said he had a stent and have a  lot of bruises-said he was told to cal if this happen. He have bruises on his chest and his thighs.

## 2012-12-04 NOTE — Telephone Encounter (Signed)
Returned call.  Pt c/o bruises on chest and thighs.  Stated they are almost gone now.  Stated one was swollen, but it's gone down now.  Stated he was told he would have some bruises and he was calling to report them.  Pt informed he will have bruises b/c he is taking Plavix and ASA.  Informed he will need to be more careful about minor bumps and scrapes as he is prone to increased bleeding with these medications.  Pt also informed on signs of hematoma and DVT and advised to call the office if he notices those signs.  Pt verbalized understanding and agreed w/ plan.

## 2013-01-20 ENCOUNTER — Other Ambulatory Visit: Payer: Self-pay

## 2013-01-21 MED ORDER — PANTOPRAZOLE SODIUM 40 MG PO TBEC: 40.0000 mg | DELAYED_RELEASE_TABLET | Freq: Every day | ORAL | Status: DC

## 2013-01-21 NOTE — Telephone Encounter (Signed)
Patient is past due OV with SLF only for one year f/u.

## 2013-02-06 ENCOUNTER — Ambulatory Visit: Payer: Medicare Other | Admitting: Gastroenterology

## 2013-02-19 ENCOUNTER — Ambulatory Visit (INDEPENDENT_AMBULATORY_CARE_PROVIDER_SITE_OTHER): Payer: Medicare Other | Admitting: Cardiology

## 2013-02-19 ENCOUNTER — Encounter: Payer: Self-pay | Admitting: Cardiology

## 2013-02-19 VITALS — BP 138/62 | HR 67 | Ht 63.0 in | Wt 169.4 lb

## 2013-02-19 DIAGNOSIS — I200 Unstable angina: Secondary | ICD-10-CM

## 2013-02-19 DIAGNOSIS — I10 Essential (primary) hypertension: Secondary | ICD-10-CM

## 2013-02-19 DIAGNOSIS — Z9861 Coronary angioplasty status: Secondary | ICD-10-CM

## 2013-02-19 DIAGNOSIS — E785 Hyperlipidemia, unspecified: Secondary | ICD-10-CM

## 2013-02-19 DIAGNOSIS — I2510 Atherosclerotic heart disease of native coronary artery without angina pectoris: Secondary | ICD-10-CM

## 2013-02-19 DIAGNOSIS — I252 Old myocardial infarction: Secondary | ICD-10-CM

## 2013-02-19 DIAGNOSIS — I219 Acute myocardial infarction, unspecified: Secondary | ICD-10-CM | POA: Insufficient documentation

## 2013-02-19 DIAGNOSIS — Z955 Presence of coronary angioplasty implant and graft: Secondary | ICD-10-CM

## 2013-02-19 NOTE — Assessment & Plan Note (Signed)
History of angioplasties in the past with most recently a bare-metal stent to the LAD. No residual symptoms. On a good regimen of aspirin, Plavix, beta blocker. Also her ARB HCT combination with low-dose hydralazine for additional blood pressure control.

## 2013-02-19 NOTE — Patient Instructions (Signed)
Continue with your current medication.  Your physician wants you to follow-up in 6 months Dr Ellyn Hack.  You will receive a reminder letter in the mail two months in advance. If you don't receive a letter, please call our office to schedule the follow-up appointment.

## 2013-02-19 NOTE — Assessment & Plan Note (Signed)
Well-controlled on current regimen. I wonder if he really needs hydralazine. Could consider stopping that to allow for some mild wrist hypertension. For now he stable I would leave it alone.

## 2013-02-19 NOTE — Assessment & Plan Note (Signed)
Has a distant history of MI, but no wall motion abnormalities noted on echo. His last Myoview was so long ago that is probably no longer pertinent. It was in 2005.

## 2013-02-19 NOTE — Assessment & Plan Note (Addendum)
Bare-metal stent was chosen due to his history of PUD and GI bleed. He continues on DAPT which would be best to continue for a year with his presentation as unstable angina. However if it needs to be stopped for bleeding or procedures, that would be acceptable with a bare-metal stent. While on that he is also on a PPI -- pantoprazole

## 2013-02-19 NOTE — Assessment & Plan Note (Signed)
No recurrent anginal symptoms post PCI. He has moderate lesions elsewhere but are relatively asymptomatic.

## 2013-02-19 NOTE — Progress Notes (Signed)
PATIENT: Anthony Galvan MRN: [id number]  DOB: 03-25-1922   DOV:02/19/2013 PCP: Maggie Font, MD  Clinic Note: Chief Complaint  Patient presents with  . ROV 4 months RAW-DH    No complaints.   HPI: Anthony Galvan is a 79 y.o. male with a PMH below who presents today for her six-month followup. He is a very pleasant [employer] War [employer] veteran from the [place] pain with a distant history of MI back in 43 and in 1980. He is a cardiac catheterizations in 1980s Will 2003. He also catheterization in 2008 demonstrating diffuse moderate disease is noted below.  I met him when he presented with unstable angina in April 2014. He is referred down from any Hospital by Dr. Rollene Fare. Cardiac catheterization showed a high-grade 90% apple core lesion in the mid LAD. He also had 50% PDA stenosis as well as 60-70% in the ramus. The LAD lesion was treated with a 3.0 mm BMS stent postdilated with 3.5 mm. It should use it bare-metal stent simply because he has a history of GI bleed with PUD. Since the PCI he is not doing great without any complaints. He last saw Dr. Rollene Fare on July 10 of this year. He was doing fairly well. He was reduced down to a baby aspirin from full dose aspirin along with Plavix.  Interval History: He presents today doing quite well without any major concerns. He denies any chest tightness or pressure with rest or exertion. He is quite active and the tori were walking about 30 minutes every day. He denies any lightheadedness, dizziness or wooziness. No syncope near syncope. No TIA or amaurosis fugax symptoms. No melena, hematochezia or hematuria. No claudication symptoms. He denies any PND, orthopnea or edema.  Past Medical History  Diagnosis Date  . Duodenal ulcer May 2011    on EGD  . Helicobacter pylori gastritis MAY 2012 HP STOOL AG NEG  . GERD (gastroesophageal reflux disease)   . CAD (coronary artery disease)   . MI (myocardial infarction) 1976, 1980, [place] [facility] at [facility] and [facility] (Dr. Iona Beard)  . Hyperlipidemia   . HTN (hypertension)   . Osteoarthritis   . S/P colonoscopy 2011    normal, internal hemorrhoids  . Presence of bare metal stent in LAD coronary artery - VerifFlex BMS 3.0 mm x 12 mm - post-dilated to 3.5 mm 08/01/2012    VeriFlex BMS 3.0 mm x 12 mm -- 3.5 mm    Prior Cardiac Evaluation and Past Surgical History:  Past Surgical History  Procedure Laterality Date  . Coronary angioplasty  1980, 1996    Insetting of MI  . Cataracts    . Colonoscopy  OCT 2011 ARS    SML IH  . Upper gastrointestinal endoscopy  MAY 2011 MELENA, HEMATEMESIS    DUODENAL ULCER, Bx: H PYLORI POS  . Cardiac catheterization  2008    D1 75 %, mid LAD 50%, 50-70% circumflex, 50-70% RCA.  . Cardiac catheterization  April 2014    Mid LAD 99% apple core; D2 ostial 70-80% (2 small for PCI) small nondominant RCA 60-70%. Distal circumflex/L PDA ~50% --> PCI of LAD  . Coronary stent placement  April 2014    Mid LAD:D1 75 %, mid LAD 50%, 50-70% circumflex, 50-70% RCA.  . Transthoracic echocardiogram  April 2014    Is normal LV size and function. EF 60-65%. Grade 1 diastolic soft. Mild aortic valve calcification    No Known  Allergies  Current Outpatient Prescriptions  Medication Sig Dispense Refill  . aspirin EC 81 MG tablet Take 81 mg by mouth daily.      Marland Kitchen atorvastatin (LIPITOR) 80 MG tablet Take 1 tablet (80 mg total) by mouth daily at 6 PM.  30 tablet  5  . clopidogrel (PLAVIX) 75 MG tablet Take 1 tablet (75 mg total) by mouth daily with breakfast.  30 tablet  11  . hydrALAZINE (APRESOLINE) 10 MG tablet Take 20 mg by mouth 2 (two) times daily.       . metoprolol (TOPROL-XL) 100 MG 24 hr tablet Take 100 mg by mouth daily.        . nitroGLYCERIN (NITROSTAT) 0.4 MG SL tablet Place 1 tablet (0.4 mg total) under the tongue every 5 (five) minutes as needed for chest pain.  25 tablet  5  . pantoprazole (PROTONIX) 40 MG tablet Take 1 tablet (40 mg total) by  mouth daily.  30 tablet  5  . valsartan-hydrochlorothiazide (DIOVAN-HCT) 320-12.5 MG per tablet Take 1 tablet by mouth daily.       No current facility-administered medications for this visit.    History   Social History Narrative   Is a father of 3 with 3 grandchildren 3 great-grandchildren.   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 He was a war 2 veteran in the [place]. 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   He never smoked. He does not drink alcohol.   He is very active, and a majority. He walks 30+ minutes every day. He also likes to play golf.   ROS: A comprehensive Review of Systems - Negative except Mild osteoporosis pains in his knees and hips. Nothing significant.  PHYSICAL EXAM BP 138/62  Pulse 67  Ht 5\' 3"  (1.6 m)  Wt 169 lb 6.4 oz (76.839 kg)  BMI 30.02 kg/m2 General appearance: alert, cooperative, appears stated age, no distress and mildly obese Neck: no adenopathy, no carotid bruit and no JVD Lungs: clear to auscultation bilaterally, normal percussion bilaterally and Nonlabored, good air movement Heart: regular rate and rhythm, S1, S2 normal, S4 present and Soft 1-2/6 SEM at the base. Does not radiate. It is crescendo to crescendo. No other an/or/C. Abdomen: soft, non-tender; bowel sounds normal; no masses,  no organomegaly and Obese Extremities: extremities normal, atraumatic, no cyanosis or edema, no edema, redness or tenderness in the calves or thighs and no ulcers, gangrene or trophic changes Pulses: 2+ and symmetric Neurologic: Grossly normal HEENT: [place]/AT, EOMI, MMM, anicteric sclera   [id number] today: Yes Rate: 67 , Rhythm:  Borderline LVH criteria. Otherwise normal.NSR,;    Recent Labs: 10/25/2012:    TC 133, HDL 43, LDL 58, TG 159  ASSESSMENT / PLAN: CORONARY ARTERY DISEASE History of angioplasties in the past with most  recently a bare-metal stent to the LAD. No residual symptoms. On a good regimen of aspirin, Plavix, beta blocker. Also her ARB HCT combination with low-dose hydralazine for additional blood pressure control.  Presence of bare metal stent in LAD coronary artery - VerifFlex BMS 3.0 mm x 12 mm - post-dilated to 3.5 mm Bare-metal stent was chosen due to his history of PUD and GI bleed. He continues on DAPT which would be best to continue for a year with his presentation as unstable angina. However if it needs to be  stopped for bleeding or procedures, that would be acceptable with a bare-metal stent. While on that he is also on a PPI -- pantoprazole  Unstable angina No recurrent anginal symptoms post PCI. He has moderate lesions elsewhere but are relatively asymptomatic.  MYOCARDIAL INFARCTION, HX OF Has a distant history of MI, but no wall motion abnormalities noted on echo. His last Myoview was so long ago that is probably no longer pertinent. It was in 2005.  HYPERTENSION Well-controlled on current regimen. I wonder if he really needs hydralazine. Could consider stopping that to allow for some mild wrist hypertension. For now he stable I would leave it alone.  HYPERLIPIDEMIA Fairly well-controlled based on last labs. He is on atorvastatin 80 mg daily. I could probably decrease it to 40 mg.   Orders Placed This Encounter  Procedures  . EKG 12-Lead   Meds ordered this encounter  Medications  . aspirin EC 81 MG tablet    Sig: Take 81 mg by mouth daily.    Followup: 6 months  Honor Frison W. Ellyn Hack, M.D., M.S. [facility] [address] [place], [place]  [address]  [phone] Pager # [phone]

## 2013-02-19 NOTE — Assessment & Plan Note (Signed)
Fairly well-controlled based on last labs. He is on atorvastatin 80 mg daily. I could probably decrease it to 40 mg.

## 2013-07-30 ENCOUNTER — Encounter: Payer: Self-pay | Admitting: *Deleted

## 2013-08-19 ENCOUNTER — Ambulatory Visit: Payer: Medicare Other | Admitting: Gastroenterology

## 2013-08-19 ENCOUNTER — Encounter: Payer: Self-pay | Admitting: *Deleted

## 2013-08-21 ENCOUNTER — Ambulatory Visit: Payer: Medicare Other | Admitting: Cardiology

## 2013-08-22 ENCOUNTER — Encounter: Payer: Self-pay | Admitting: Cardiology

## 2013-08-22 ENCOUNTER — Ambulatory Visit (INDEPENDENT_AMBULATORY_CARE_PROVIDER_SITE_OTHER): Payer: Medicare Other | Admitting: Cardiology

## 2013-08-22 VITALS — BP 138/62 | HR 71 | Ht 64.0 in | Wt 171.9 lb

## 2013-08-22 DIAGNOSIS — K269 Duodenal ulcer, unspecified as acute or chronic, without hemorrhage or perforation: Secondary | ICD-10-CM

## 2013-08-22 DIAGNOSIS — Z9861 Coronary angioplasty status: Secondary | ICD-10-CM

## 2013-08-22 DIAGNOSIS — Z8719 Personal history of other diseases of the digestive system: Secondary | ICD-10-CM

## 2013-08-22 DIAGNOSIS — I10 Essential (primary) hypertension: Secondary | ICD-10-CM

## 2013-08-22 DIAGNOSIS — E785 Hyperlipidemia, unspecified: Secondary | ICD-10-CM

## 2013-08-22 DIAGNOSIS — I219 Acute myocardial infarction, unspecified: Secondary | ICD-10-CM

## 2013-08-22 DIAGNOSIS — I2510 Atherosclerotic heart disease of native coronary artery without angina pectoris: Secondary | ICD-10-CM

## 2013-08-22 NOTE — Patient Instructions (Signed)
Talk with your doctor about decrease your Lipitor to 40 mg.   Continue with current  Medication ,but stop Aspirin.  Your physician wants you to follow-up in 6 month Dr Ellyn Hack.  You will receive a reminder letter in the mail two months in advance. If you don't receive a letter, please call our office to schedule the follow-up appointment.

## 2013-08-24 ENCOUNTER — Encounter: Payer: Self-pay | Admitting: Cardiology

## 2013-08-24 DIAGNOSIS — Z9861 Coronary angioplasty status: Principal | ICD-10-CM

## 2013-08-24 DIAGNOSIS — I2510 Atherosclerotic heart disease of native coronary artery without angina pectoris: Secondary | ICD-10-CM | POA: Insufficient documentation

## 2013-08-24 NOTE — Assessment & Plan Note (Addendum)
Monitored by PCP. By last July his labs, he was well-controlled. Consider reducing Lipitor to 40 mg daily.

## 2013-08-24 NOTE — Progress Notes (Signed)
PATIENT: Anthony Galvan MRN: [id number]  DOB: [birth year]   DOV:08/24/2013 PCP: Maggie Font, MD  Clinic Note: Chief Complaint  Patient presents with  . 6 MONTH VISIT    NO CHEST PAIN ,SOB 1-2 X, NO EDEMA, LABS IN 07/2013 --from [place]   HPI: Anthony Galvan is a 80 y.o. male with a PMH below who presents today for six-month followup. He is a very pleasant [employer] War [employer] veteran from the [place] with a distant history of MI back in 19 and in 1980.  I met him in [place] 2014 when he was transferred from [facility] for Unstable Angina => cath revealed a high-grade 90% apple core lesion in the midLAD along with a 50% PLA & 60-70% lesion in the ramus. The LAD lesion was treated with a 3.0 mm BMS stent postdilated with 3.5 mm. It should use it bare-metal stent simply because he has a history of GI bleed with PUD. Since the PCI he is not doing great without any complaints. He last saw Dr. Rollene Fare on July 10 of this year. He was doing fairly well. He was reduced down to a baby aspirin from full dose aspirin along with Plavix.  Interval History: He presents today doing quite well without any major concerns. He denies any chest tightness or pressure with rest or exertion. He said the cat gut lazy over the wintertime because he doesn't outside in the cold. They may get it does go back outside and hot. He is quite active otherwise, and tries to get out walking about 30 minutes every day. States he is just now getting started exercising he does feel a bit out of shape and gets short more short of breath than he did at the end of the year last year. He denies any lightheadedness, dizziness or wooziness. No syncope near syncope. No TIA or amaurosis fugax symptoms. No melena, hematochezia or hematuria. No claudication symptoms. He denies any PND, orthopnea or edema.  Has a strange crawling sensation in the left side of his head on occasion have ringing feeling and then a rippling affect  Past Medical  History  Diagnosis Date  . Duodenal ulcer May 2011    on EGD  . Helicobacter pylori gastritis MAY 2012 HP STOOL AG NEG  . GERD (gastroesophageal reflux disease)   . CAD S/P percutaneous coronary angioplasty   . MI (myocardial infarction) 1976, 1980, [place] for at [facility] and [place] (Dr. Iona Beard)  . Hyperlipidemia   . HTN (hypertension)   . Osteoarthritis   . S/P colonoscopy 2011    normal, internal hemorrhoids  . Presence of bare metal stent in LAD coronary artery - VerifFlex BMS 3.0 mm x 12 mm - post-dilated to 3.5 mm 08/01/2012    VeriFlex BMS 3.0 mm x 12 mm -- 3.5 mm    Prior Cardiac Evaluation / Procedure History:  Procedure Laterality Date  . Coronary angioplasty  1980, 1996    in setting of MI  . Cardiac catheterization  01/30/2007    D1 75 %, mid LAD 50%, 50-70% circumflex, 50-70% RCA.(Dr. Adora Fridge)  . Cardiac catheterization  April 2014    Mid LAD 99% apple core; D2 ostial 70-80% (2 small for PCI) small nondominant RCA 60-70%. Distal circumflex/L PDA ~50% --> PCI of LAD  . Coronary stent placement  08/01/2012    Mid LAD:D1 75 %, mid LAD 50%, 50-70% circumflex, 50-70% RCA; PCI of mid-LAD with  VeriFlex BMS 3x8m (Dr. DRoni Bread  . Transthoracic echocardiogram  April 2014    normal LV size and function. EF 60-65%. Grade 1 diastolic soft. Mild aortic valve calcification  . Nm myocar perf wall motion  08/2003    adenosine stress - focal decreased perfusion defect in distal inferior wall, no significant ischemic changes  . Cardiac catheterization  12/11/2001    normal L main, small RCA, dominant LL Cfx with mild diffuse disease, LAD with mid 10-20% stenosis, ramus intermedius with mild diffuse disease (Dr. JJackie Plum    No Known Allergies  Current Outpatient Prescriptions  Medication Sig Dispense Refill  . aspirin EC 81 MG tablet Take 81 mg by mouth daily.      .Marland Kitchenatorvastatin (LIPITOR) 80 MG tablet Take 1 tablet (80 mg total) by mouth daily at 6 PM.  30  tablet  5  . clopidogrel (PLAVIX) 75 MG tablet Take 1 tablet (75 mg total) by mouth daily with breakfast.  30 tablet  11  . hydrALAZINE (APRESOLINE) 10 MG tablet Take 20 mg by mouth 2 (two) times daily.       . metoprolol (TOPROL-XL) 100 MG 24 hr tablet Take 100 mg by mouth daily.        . nitroGLYCERIN (NITROSTAT) 0.4 MG SL tablet Place 1 tablet (0.4 mg total) under the tongue every 5 (five) minutes as needed for chest pain.  25 tablet  5  . pantoprazole (PROTONIX) 40 MG tablet Take 1 tablet (40 mg total) by mouth daily.  30 tablet  5  . valsartan-hydrochlorothiazide (DIOVAN-HCT) 320-12.5 MG per tablet Take 1 tablet by mouth daily.       No current facility-administered medications for this visit.    History   Social History Narrative   Is a father of 3 with 3 grandchildren 3 great-grandchildren. He lives with one of his 2 grandsons.   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   He never smoked. He does not drink alcohol.   He is very active, and a majority. He walks 30+ minutes every day. He also likes to play golf.   ROS: A comprehensive Review of Systems - Negative except Mild osteoporosis pains in his knees and hips. Nothing significant. mild occasional GERD; he just got new hearing aids  PHYSICAL EXAM BP 138/62  Pulse 71  Ht 5' 4" (1.626 m)  Wt 171 lb 14.4 oz (77.973 kg)  BMI 29.49 kg/m2 General appearance: alert, cooperative, appears stated age, no distress and mildly obese HEENT: [place]/AT, EOMI, MMM, anicteric sclera Neck: no adenopathy, no carotid bruit and no JVD Lungs: CTA B., normal percussion bilaterally and Nonlabored, good air movement Heart: RRR, S1, S2 normal, S4 present and Soft 1-2/6 SEM at the base.  not radiate. It is crescendo to crescendo. No other  an/or/C. Abdomen: soft, non-tender; bowel sounds normal; no masses,  no organomegaly and Obese Extremities: extremities normal, atraumatic, no cyanosis or edema, no edema, redness or tenderness in the calves or thighs and no ulcers, gangrene or trophic changes Pulses: 2+ and symmetric Neurologic: Grossly normal  EZYY:QMGNOIBBCtoday: Yes Rate: 71, Rhythm:  NSR;  left axis deviation (-39), both as criteria consistent with LVH. Possible left atrial enlargement -- otherwise normal and stable EKG.  Labs: Checked and [facility]  ASSESSMENT / PLAN: CAD S/P percutaneous coronary angioplasty Stable status post angioplasty and recent bare-metal stent PCI. He is far enough out now we can probably have him stop his aspirin and just continue Plavix. He is on statin, ARB and beta blocker  MI (myocardial infarction) Distant MI. No recurrent symptoms relatively normal overall function with no heart failure or recurrent anginal symptoms.  HYPERLIPIDEMIA Monitored by PCP. By last July his labs, he was well-controlled. Consider reducing Lipitor to 40 mg daily.  HYPERTENSION Well-controlled. Continue current regimen. Could probably stop hydralazine  DUODENAL ULCER/ MELENA, HX OF BMS used for last PCI. Okay to stop aspirin. Would prefer to continue Plavix based on the recent LAD stent   Orders Placed This Encounter  Procedures  . EKG 12-Lead   No orders of the defined types were placed in this encounter.    Followup: 6 months  Leonie Man, M.D., M.S. Interventional Cardiologist   Pager # [phone] 08/24/2013

## 2013-08-24 NOTE — Assessment & Plan Note (Signed)
BMS used for last PCI. Okay to stop aspirin. Would prefer to continue Plavix based on the recent LAD stent

## 2013-08-24 NOTE — Assessment & Plan Note (Addendum)
Bare-metal stent used for recent PCI. Can now DC aspirin to reduce chances of GI bleed

## 2013-08-24 NOTE — Assessment & Plan Note (Signed)
Stable status post angioplasty and recent bare-metal stent PCI. He is far enough out now we can probably have him stop his aspirin and just continue Plavix. He is on statin, ARB and beta blocker

## 2013-08-24 NOTE — Assessment & Plan Note (Signed)
Well-controlled. Continue current regimen. Could probably stop hydralazine

## 2013-08-24 NOTE — Assessment & Plan Note (Signed)
Distant MI. No recurrent symptoms relatively normal overall function with no heart failure or recurrent anginal symptoms.

## 2013-08-26 ENCOUNTER — Other Ambulatory Visit (HOSPITAL_COMMUNITY): Payer: Self-pay | Admitting: Physician Assistant

## 2013-08-27 NOTE — Telephone Encounter (Signed)
Rx was sent to pharmacy electronically. 

## 2013-09-01 ENCOUNTER — Emergency Department (HOSPITAL_COMMUNITY): Payer: Medicare Other

## 2013-09-01 ENCOUNTER — Emergency Department (HOSPITAL_COMMUNITY)
Admission: EM | Admit: 2013-09-01 | Discharge: 2013-09-01 | Disposition: A | Discharge status: Home / Self Care | Payer: Medicare Other | Attending: Emergency Medicine | Admitting: Emergency Medicine

## 2013-09-01 ENCOUNTER — Encounter (HOSPITAL_COMMUNITY): Payer: Self-pay | Admitting: Emergency Medicine

## 2013-09-01 DIAGNOSIS — K219 Gastro-esophageal reflux disease without esophagitis: Secondary | ICD-10-CM | POA: Insufficient documentation

## 2013-09-01 DIAGNOSIS — Z95818 Presence of other cardiac implants and grafts: Secondary | ICD-10-CM | POA: Insufficient documentation

## 2013-09-01 DIAGNOSIS — I252 Old myocardial infarction: Secondary | ICD-10-CM | POA: Insufficient documentation

## 2013-09-01 DIAGNOSIS — E785 Hyperlipidemia, unspecified: Secondary | ICD-10-CM | POA: Insufficient documentation

## 2013-09-01 DIAGNOSIS — K8020 Calculus of gallbladder without cholecystitis without obstruction: Secondary | ICD-10-CM

## 2013-09-01 DIAGNOSIS — R1033 Periumbilical pain: Secondary | ICD-10-CM | POA: Insufficient documentation

## 2013-09-01 DIAGNOSIS — Z9861 Coronary angioplasty status: Secondary | ICD-10-CM | POA: Insufficient documentation

## 2013-09-01 DIAGNOSIS — I2510 Atherosclerotic heart disease of native coronary artery without angina pectoris: Secondary | ICD-10-CM | POA: Insufficient documentation

## 2013-09-01 DIAGNOSIS — Z9889 Other specified postprocedural states: Secondary | ICD-10-CM | POA: Insufficient documentation

## 2013-09-01 DIAGNOSIS — D696 Thrombocytopenia, unspecified: Secondary | ICD-10-CM | POA: Insufficient documentation

## 2013-09-01 DIAGNOSIS — Z7982 Long term (current) use of aspirin: Secondary | ICD-10-CM | POA: Insufficient documentation

## 2013-09-01 DIAGNOSIS — Z79899 Other long term (current) drug therapy: Secondary | ICD-10-CM | POA: Insufficient documentation

## 2013-09-01 DIAGNOSIS — R17 Unspecified jaundice: Secondary | ICD-10-CM

## 2013-09-01 DIAGNOSIS — Z7902 Long term (current) use of antithrombotics/antiplatelets: Secondary | ICD-10-CM | POA: Insufficient documentation

## 2013-09-01 DIAGNOSIS — Z9849 Cataract extraction status, unspecified eye: Secondary | ICD-10-CM | POA: Insufficient documentation

## 2013-09-01 DIAGNOSIS — R109 Unspecified abdominal pain: Secondary | ICD-10-CM

## 2013-09-01 DIAGNOSIS — R112 Nausea with vomiting, unspecified: Secondary | ICD-10-CM | POA: Insufficient documentation

## 2013-09-01 DIAGNOSIS — I10 Essential (primary) hypertension: Secondary | ICD-10-CM | POA: Insufficient documentation

## 2013-09-01 DIAGNOSIS — M1990 Unspecified osteoarthritis, unspecified site: Secondary | ICD-10-CM | POA: Insufficient documentation

## 2013-09-01 LAB — CBC WITH DIFFERENTIAL/PLATELET
BASOS ABS: 0.1 10*3/uL (ref 0.0–0.1)
Basophils Relative: 0 % (ref 0–1)
EOS ABS: 0.3 10*3/uL (ref 0.0–0.7)
EOS PCT: 3 % (ref 0–5)
HEMATOCRIT: 42.4 % (ref 39.0–52.0)
Hemoglobin: 13.5 g/dL (ref 13.0–17.0)
LYMPHS PCT: 23 % (ref 12–46)
Lymphs Abs: 2.8 10*3/uL (ref 0.7–4.0)
MCH: 28.8 pg (ref 26.0–34.0)
MCHC: 31.8 g/dL (ref 30.0–36.0)
MCV: 90.6 fL (ref 78.0–100.0)
MONO ABS: 0.8 10*3/uL (ref 0.1–1.0)
Monocytes Relative: 7 % (ref 3–12)
Neutro Abs: 8.6 10*3/uL — ABNORMAL HIGH (ref 1.7–7.7)
Neutrophils Relative %: 67 % (ref 43–77)
PLATELETS: 148 10*3/uL — AB (ref 150–400)
RBC: 4.68 MIL/uL (ref 4.22–5.81)
RDW: 11.9 % (ref 11.5–15.5)
WBC: 12.6 10*3/uL — AB (ref 4.0–10.5)

## 2013-09-01 LAB — COMPREHENSIVE METABOLIC PANEL
ALT: 23 U/L (ref 0–53)
AST: 26 U/L (ref 0–37)
Albumin: 4 g/dL (ref 3.5–5.2)
Alkaline Phosphatase: 74 U/L (ref 39–117)
BUN: 17 mg/dL (ref 6–23)
CALCIUM: 9.5 mg/dL (ref 8.4–10.5)
CO2: 28 meq/L (ref 19–32)
CREATININE: 1.24 mg/dL (ref 0.50–1.35)
Chloride: 99 mEq/L (ref 96–112)
GFR, EST AFRICAN AMERICAN: 58 mL/min — AB (ref >90)
GFR, EST NON AFRICAN AMERICAN: 50 mL/min — AB (ref >90)
Glucose, Bld: 97 mg/dL (ref 70–99)
Potassium: 4.6 mEq/L (ref 3.7–5.3)
Sodium: 140 mEq/L (ref 137–147)
TOTAL PROTEIN: 8 g/dL (ref 6.0–8.3)
Total Bilirubin: 1.5 mg/dL — ABNORMAL HIGH (ref 0.3–1.2)

## 2013-09-01 LAB — LACTIC ACID, PLASMA: LACTIC ACID, VENOUS: 1.5 mmol/L (ref 0.5–2.2)

## 2013-09-01 LAB — LIPASE, BLOOD: Lipase: 60 U/L — ABNORMAL HIGH (ref 11–59)

## 2013-09-01 MED ORDER — IOHEXOL 300 MG/ML  SOLN
100.0000 mL | Freq: Once | INTRAMUSCULAR | Status: AC | PRN
  Administered 2013-09-01: 100 mL via INTRAVENOUS

## 2013-09-01 MED ORDER — ONDANSETRON HCL 4 MG/2ML IJ SOLN
4.0000 mg | Freq: Once | INTRAMUSCULAR | Status: AC
  Administered 2013-09-01: 4 mg via INTRAVENOUS
  Filled 2013-09-01: qty 2

## 2013-09-01 MED ORDER — OXYCODONE-ACETAMINOPHEN 5-325 MG PO TABS: 1.0000 | ORAL_TABLET | ORAL | Status: DC | PRN

## 2013-09-01 MED ORDER — METOCLOPRAMIDE HCL 5 MG/ML IJ SOLN
10.0000 mg | Freq: Once | INTRAMUSCULAR | Status: AC
  Administered 2013-09-01: 10 mg via INTRAVENOUS
  Filled 2013-09-01: qty 2

## 2013-09-01 MED ORDER — MORPHINE SULFATE 4 MG/ML IJ SOLN
4.0000 mg | Freq: Once | INTRAMUSCULAR | Status: AC
  Administered 2013-09-01: 4 mg via INTRAVENOUS
  Filled 2013-09-01: qty 1

## 2013-09-01 MED ORDER — METOCLOPRAMIDE HCL 10 MG PO TABS: 10.0000 mg | ORAL_TABLET | Freq: Four times a day (QID) | ORAL | Status: DC | PRN

## 2013-09-01 MED ORDER — DIPHENHYDRAMINE HCL 50 MG/ML IJ SOLN
25.0000 mg | Freq: Once | INTRAMUSCULAR | Status: AC
  Administered 2013-09-01: 25 mg via INTRAVENOUS
  Filled 2013-09-01: qty 1

## 2013-09-01 MED ORDER — IOHEXOL 300 MG/ML  SOLN
50.0000 mL | Freq: Once | INTRAMUSCULAR | Status: AC | PRN
  Administered 2013-09-01: 50 mL via ORAL

## 2013-09-01 NOTE — ED Provider Notes (Signed)
CSN: [id number]     Arrival date & time 09/01/13  0350 History   First MD Initiated Contact with Patient 09/01/13 [phone]     Chief complaint: Abdominal pain  (Consider location/radiation/quality/duration/timing/severity/associated sxs/prior Treatment) The history is provided by the patient.   80 year old male comes in complaining of crampy periumbilical pain for the last 2 weeks. Pain has gotten significantly worse over the last 24 hours. There is some radiation of pain to the back. There is associated nausea but no vomiting. He rates pain at 10/10. He was unable to sleep tonight because of pain. There has been no constipation or diarrhea. Nothing makes his pain better nothing makes it worse. He has taken Pepto-Bismol with no relief. He has an appointment to see his gastroenterologist on May 28 but he did not feel he could wait that long.  Past Medical History  Diagnosis Date  . Duodenal ulcer May 2011    on EGD  . Helicobacter pylori gastritis MAY 2012 HP STOOL AG NEG  . GERD (gastroesophageal reflux disease)   . CAD S/P percutaneous coronary angioplasty   . MI (myocardial infarction) 1976, 1980, [place] for at [facility] and [place] (Dr. Iona Beard)  . Hyperlipidemia   . HTN (hypertension)   . Osteoarthritis   . S/P colonoscopy 2011    normal, internal hemorrhoids  . Presence of bare metal stent in LAD coronary artery - VerifFlex BMS 3.0 mm x 12 mm - post-dilated to 3.5 mm 08/01/2012    VeriFlex BMS 3.0 mm x 12 mm -- 3.5 mm   Past Surgical History  Procedure Laterality Date  . Coronary angioplasty  1980, 1996    in setting of MI  . Cataract extraction    . Colonoscopy  OCT 2011 ARS    SML IH  . Upper gastrointestinal endoscopy  MAY 2011 MELENA, HEMATEMESIS    DUODENAL ULCER, Bx: H PYLORI POS  . Cardiac catheterization  01/30/2007    D1 75 %, mid LAD 50%, 50-70% circumflex, 50-70% RCA.(Dr. Adora Fridge)  . Cardiac catheterization  April 2014    Mid LAD 99% apple core; D2  ostial 70-80% (2 small for PCI) small nondominant RCA 60-70%. Distal circumflex/L PDA ~50% --> PCI of LAD  . Coronary stent placement  08/01/2012    Mid LAD:D1 75 %, mid LAD 50%, 50-70% circumflex, 50-70% RCA; PCI of mid-LAD with VeriFlex BMS 3x19mm (Dr. Roni Bread)  . Transthoracic echocardiogram  April 2014    normal LV size and function. EF 60-65%. Grade 1 diastolic soft. Mild aortic valve calcification  . Nm myocar perf wall motion  08/2003    adenosine stress - focal decreased perfusion defect in distal inferior wall, no significant ischemic changes  . Cardiac catheterization  12/11/2001    normal L main, small RCA, dominant LL Cfx with mild diffuse disease, LAD with mid 10-20% stenosis, ramus intermedius with mild diffuse disease (Dr. Jackie Plum)   Family History  Problem Relation Age of Onset  . Colon cancer Neg Hx   . Heart disease Mother   . Kidney disease Father   . Heart disease Brother 16  . Heart disease Sister    History  Substance Use Topics  . Smoking status: Never Smoker   . Smokeless tobacco: Never Used  . Alcohol Use: No    Review of Systems  All other systems reviewed and are negative.     Allergies  Review of patient's allergies indicates no known allergies.  Home Medications   Prior to Admission medications   Medication Sig Start Date End Date Taking? Authorizing Provider  atorvastatin (LIPITOR) 80 MG tablet Take 1 tablet (80 mg total) by mouth daily at 6 PM. 08/02/12  Yes Tarri Fuller, PA-C  clopidogrel (PLAVIX) 75 MG tablet TAKE ONE (1) TABLET BY MOUTH EVERY DAY   Yes Leonie Man, MD  hydrALAZINE (APRESOLINE) 10 MG tablet Take 20 mg by mouth 2 (two) times daily.    Yes Historical Provider, MD  metoprolol (TOPROL-XL) 100 MG 24 hr tablet Take 100 mg by mouth daily.     Yes Historical Provider, MD  nitroGLYCERIN (NITROSTAT) 0.4 MG SL tablet Place 1 tablet (0.4 mg total) under the tongue every 5 (five) minutes as needed for chest pain. 08/02/12  Yes Tarri Fuller, PA-C  pantoprazole (PROTONIX) 40 MG tablet Take 1 tablet (40 mg total) by mouth daily. 01/20/13  Yes Mahala Menghini, PA-C  valsartan-hydrochlorothiazide (DIOVAN-HCT) 320-12.5 MG per tablet Take 1 tablet by mouth daily.   Yes Historical Provider, MD  aspirin EC 81 MG tablet Take 81 mg by mouth daily.    Historical Provider, MD   BP 175/92  Pulse 88  Temp(Src) 99 F (37.2 C) (Oral)  Resp 18  Ht 5\' 3"  (1.6 m)  Wt 169 lb (76.658 kg)  BMI 29.94 kg/m2  SpO2 98% Physical Exam  Nursing note and vitals reviewed.  80 year old male, resting comfortably and in no acute distress. Vital signs are significant for hypertension with blood pressure 175/92. Oxygen saturation is 98%, which is normal. Head is normocephalic and atraumatic. PERRLA, EOMI. Oropharynx is clear. Neck is nontender and supple without adenopathy or JVD. Back is nontender and there is no CVA tenderness. Lungs are clear without rales, wheezes, or rhonchi. Chest is nontender. Heart has regular rate and rhythm without murmur. Abdomen is soft, mildly distended, with mild tenderness in the midabdomen. There is no rebound or guarding. There are no masses or hepatosplenomegaly and peristalsis is hypoactive. Extremities have trace edema, full range of motion is present. Skin is warm and dry without rash. Neurologic: Mental status is normal, cranial nerves are intact, there are no motor or sensory deficits.  ED Course  Procedures (including critical care time) Labs Review Results for orders placed during the hospital encounter of 09/01/13  CBC WITH DIFFERENTIAL      Result Value Ref Range   WBC 12.6 (*) 4.0 - 10.5 K/uL   RBC 4.68  4.22 - 5.81 MIL/uL   Hemoglobin 13.5  13.0 - 17.0 g/dL   HCT 42.4  39.0 - 52.0 %   MCV 90.6  78.0 - 100.0 fL   MCH 28.8  26.0 - 34.0 pg   MCHC 31.8  30.0 - 36.0 g/dL   RDW 11.9  11.5 - 15.5 %   Platelets 148 (*) 150 - 400 K/uL   Neutrophils Relative % 67  43 - 77 %   Neutro Abs 8.6 (*) 1.7 - 7.7  K/uL   Lymphocytes Relative 23  12 - 46 %   Lymphs Abs 2.8  0.7 - 4.0 K/uL   Monocytes Relative 7  3 - 12 %   Monocytes Absolute 0.8  0.1 - 1.0 K/uL   Eosinophils Relative 3  0 - 5 %   Eosinophils Absolute 0.3  0.0 - 0.7 K/uL   Basophils Relative 0  0 - 1 %   Basophils Absolute 0.1  0.0 - 0.1 K/uL  COMPREHENSIVE METABOLIC PANEL  Result Value Ref Range   Sodium 140  137 - 147 mEq/L   Potassium 4.6  3.7 - 5.3 mEq/L   Chloride 99  96 - 112 mEq/L   CO2 28  19 - 32 mEq/L   Glucose, Bld 97  70 - 99 mg/dL   BUN 17  6 - 23 mg/dL   Creatinine, Ser 1.24  0.50 - 1.35 mg/dL   Calcium 9.5  8.4 - 10.5 mg/dL   Total Protein 8.0  6.0 - 8.3 g/dL   Albumin 4.0  3.5 - 5.2 g/dL   AST 26  0 - 37 U/L   ALT 23  0 - 53 U/L   Alkaline Phosphatase 74  39 - 117 U/L   Total Bilirubin 1.5 (*) 0.3 - 1.2 mg/dL   GFR calc non Af Amer 50 (*) >90 mL/min   GFR calc Af Amer 58 (*) >90 mL/min  LIPASE, BLOOD      Result Value Ref Range   Lipase 60 (*) 11 - 59 U/L  LACTIC ACID, PLASMA      Result Value Ref Range   Lactic Acid, Venous 1.5  0.5 - 2.2 mmol/L   Imaging Review Ct Abdomen Pelvis W Contrast  09/01/2013   CLINICAL DATA:  Mid to lower abdominal pain for the past month.  EXAM: CT ABDOMEN AND PELVIS WITH CONTRAST  TECHNIQUE: Multidetector CT imaging of the abdomen and pelvis was performed using the standard protocol following bolus administration of intravenous contrast.  CONTRAST:  79mL OMNIPAQUE IOHEXOL 300 MG/ML SOLN, 157mL OMNIPAQUE IOHEXOL 300 MG/ML SOLN  COMPARISON:  No priors.  FINDINGS: Lung Bases: 7 mm calcified granuloma in the left lower lobe. Areas of peripheral subpleural reticulation and peribronchovascular reticulation throughout the lung bases bilaterally, most pronounced in the lower lobes, concerning for underlying interstitial lung disease (poorly evaluated on today's examination). Mild cardiomegaly. There is atherosclerosis of the thoracic aorta, the great vessels of the mediastinum and  the coronary arteries, including calcified atherosclerotic plaque in the left main, left anterior descending, left circumflex and right coronary arteries. Calcifications of the aortic valve.  Abdomen/Pelvis: Sub cm calcified gallstone lying dependently in the gallbladder. No current findings to suggest acute cholecystitis at this time. The appearance of the liver, pancreas, spleen and bilateral adrenal glands is unremarkable. Multiple low-attenuation lesions in the kidneys bilaterally, the largest of which are all compatible with simple cysts, with the largest of these lesions measuring 5.1 cm in the lower pole of the left kidney. Several other renal lesions are too small to definitively characterize, but are statistically likely to represent tiny cysts. Atherosclerosis throughout the abdominal and pelvic vasculature, without evidence of aneurysm or dissection; however, there are prominent atherosclerotic plaques at the origins of the celiac axis and superior mesenteric arteries, with moderate narrowing at the SMA origin. IMA is patent. Normal appendix. No significant volume of ascites. No pneumoperitoneum. No pathologic distention of small bowel. No lymphadenopathy identified within the abdomen or pelvis. Prostate gland is enlarged measuring 4.6 x 5.8 cm, with median lobe hypertrophy. Urinary bladder is unremarkable in appearance.  Musculoskeletal: There are no aggressive appearing lytic or blastic lesions noted in the visualized portions of the skeleton.  IMPRESSION: 1. No acute findings in the abdomen or pelvis to account for the patient's symptoms. 2. Normal appendix. 3. The appearance of the lung bases bilaterally suggests underlying interstitial lung disease. This could be better characterized with followup nonemergent high-resolution chest CT in the near future. 4. Prostatomegaly.  Correlation  with PSA levels is recommended. 5. Atherosclerosis, including left main and 3 vessel coronary artery disease. In  addition, there is severe atherosclerosis throughout the abdominal and pelvic vasculature, including prominent plaques at the origin of the celiac axis and SMA. Superior mesenteric artery origin appears moderately narrowed, but is distally otherwise widely patent. 6. Multiple low-attenuation renal lesions bilaterally, largest of which are compatible with simple cysts.   Electronically Signed   By: Vinnie Langton M.D.   On: 09/01/2013 06:24    MDM   Final diagnoses:  Abdominal pain  Nausea & vomiting  Elevated bilirubin  Cholelithiasis  Thrombocytopenia    Abdominal pain of uncertain cause. Exam is benign other than mild abdominal distention. Old records are reviewed and he has a history of H. pylori related ulcer disease. He'll be sent for CT scan to further evaluate his abdominal pain.  CT is unremarkable. He is noted to have a single gallstone but no evidence of cholecystitis. Mild elevation of bilirubin as noted and has been seen in the past and is not felt to be significant. Mild leukocytosis is noted without left shift. Borderline elevated lipase is not felt to be clinically significant. Patient did get some slight relief of pain with dose of morphine but stated his pain was coming back. He continued to have nausea in spite of the ondansetron. He was reexamined and abdomen continues to be soft without any localizing findings. He'll be given a trial of metoclopramide to see if that controls his nausea better. I do not see indication for admission at this point but he will need close followup. He is discharged with instructions to followup with his PCP tomorrow and return to the ED if unable to see his PCP. He is given prescription for Roxicodone-acetaminophen for pain and metoclopramide for nausea.Delora Fuel, MD [id number]

## 2013-09-01 NOTE — ED Notes (Signed)
Pt reports abd pain for a month, states he has appt to see Dr Oneida Alar in 2 days but felt he couldn't wait.

## 2013-09-01 NOTE — ED Notes (Signed)
Patient drinking his contrast

## 2013-09-01 NOTE — Discharge Instructions (Signed)
Return if your pain is getting worse. You need to be re-evaluated tomorrow. If you cannot get in to see your doctor, then return to the ED.   Abdominal Pain, Adult Many things can cause abdominal pain. Usually, abdominal pain is not caused by a disease and will improve without treatment. It can often be observed and treated at home. Your health care provider will do a physical exam and possibly order blood tests and X-rays to help determine the seriousness of your pain. However, in many cases, more time must pass before a clear cause of the pain can be found. Before that point, your health care provider may not know if you need more testing or further treatment. HOME CARE INSTRUCTIONS  Monitor your abdominal pain for any changes. The following actions may help to alleviate any discomfort you are experiencing:  Only take over-the-counter or prescription medicines as directed by your health care provider.  Do not take laxatives unless directed to do so by your health care provider.  Try a clear liquid diet (broth, tea, or water) as directed by your health care provider. Slowly move to a bland diet as tolerated. SEEK MEDICAL CARE IF:  You have unexplained abdominal pain.  You have abdominal pain associated with nausea or diarrhea.  You have pain when you urinate or have a bowel movement.  You experience abdominal pain that wakes you in the night.  You have abdominal pain that is worsened or improved by eating food.  You have abdominal pain that is worsened with eating fatty foods. SEEK IMMEDIATE MEDICAL CARE IF:   Your pain does not go away within 2 hours.  You have a fever.  You keep throwing up (vomiting).  Your pain is felt only in portions of the abdomen, such as the right side or the left lower portion of the abdomen.  You pass bloody or black tarry stools. MAKE SURE YOU:  Understand these instructions.   Will watch your condition.   Will get help right away if you are  not doing well or get worse.  Document Released: 01/11/2005 Document Revised: 01/22/2013 Document Reviewed: 12/11/2012 [facility] Patient Information 2014 [place].  Nausea and Vomiting Nausea is a sick feeling that often comes before throwing up (vomiting). Vomiting is a reflex where stomach contents come out of your mouth. Vomiting can cause severe loss of body fluids (dehydration). Children and elderly adults can become dehydrated quickly, especially if they also have diarrhea. Nausea and vomiting are symptoms of a condition or disease. It is important to find the cause of your symptoms. CAUSES   Direct irritation of the stomach lining. This irritation can result from increased acid production (gastroesophageal reflux disease), infection, food poisoning, taking certain medicines (such as nonsteroidal anti-inflammatory drugs), alcohol use, or tobacco use.  Signals from the brain.These signals could be caused by a headache, heat exposure, an inner ear disturbance, increased pressure in the brain from injury, infection, a tumor, or a concussion, pain, emotional stimulus, or metabolic problems.  An obstruction in the gastrointestinal tract (bowel obstruction).  Illnesses such as diabetes, hepatitis, gallbladder problems, appendicitis, kidney problems, cancer, sepsis, atypical symptoms of a heart attack, or eating disorders.  Medical treatments such as chemotherapy and radiation.  Receiving medicine that makes you sleep (general anesthetic) during surgery. DIAGNOSIS Your caregiver may ask for tests to be done if the problems do not improve after a few days. Tests may also be done if symptoms are severe or if the reason for the  nausea and vomiting is not clear. Tests may include:  Urine tests.  Blood tests.  Stool tests.  Cultures (to look for evidence of infection).  X-rays or other imaging studies. Test results can help your caregiver make decisions about treatment or the need  for additional tests. TREATMENT You need to stay well hydrated. Drink frequently but in small amounts.You may wish to drink water, sports drinks, clear broth, or eat frozen ice pops or gelatin dessert to help stay hydrated.When you eat, eating slowly may help prevent nausea.There are also some antinausea medicines that may help prevent nausea. HOME CARE INSTRUCTIONS   Take all medicine as directed by your caregiver.  If you do not have an appetite, do not force yourself to eat. However, you must continue to drink fluids.  If you have an appetite, eat a normal diet unless your caregiver tells you differently.  Eat a variety of complex carbohydrates (rice, wheat, potatoes, bread), lean meats, yogurt, fruits, and vegetables.  Avoid high-fat foods because they are more difficult to digest.  Drink enough water and fluids to keep your urine clear or pale yellow.  If you are dehydrated, ask your caregiver for specific rehydration instructions. Signs of dehydration may include:  Severe thirst.  Dry lips and mouth.  Dizziness.  Dark urine.  Decreasing urine frequency and amount.  Confusion.  Rapid breathing or pulse. SEEK IMMEDIATE MEDICAL CARE IF:   You have blood or brown flecks (like coffee grounds) in your vomit.  You have black or bloody stools.  You have a severe headache or stiff neck.  You are confused.  You have severe abdominal pain.  You have chest pain or trouble breathing.  You do not urinate at least once every 8 hours.  You develop cold or clammy skin.  You continue to vomit for longer than 24 to 48 hours.  You have a fever. MAKE SURE YOU:   Understand these instructions.  Will watch your condition.  Will get help right away if you are not doing well or get worse. Document Released: 04/03/2005 Document Revised: 06/26/2011 Document Reviewed: 08/31/2010 [facility] Patient Information 2014 [place], [place].  Metoclopramide tablets What is this  medicine? METOCLOPRAMIDE (met oh kloe PRA mide) is used to treat the symptoms of gastroesophageal reflux disease (GERD) like heartburn. It is also used to treat people with slow emptying of the stomach and intestinal tract. This medicine may be used for other purposes; ask your health care provider or pharmacist if you have questions. COMMON BRAND NAME(S): Reglan What should I tell my health care provider before I take this medicine? They need to know if you have any of these conditions: -breast cancer -depression -diabetes -heart failure -high blood pressure -kidney disease -liver disease -Parkinson's disease or a movement disorder -pheochromocytoma -seizures -stomach obstruction, bleeding, or perforation -an unusual or allergic reaction to metoclopramide, procainamide, sulfites, other medicines, foods, dyes, or preservatives -pregnant or trying to get pregnant -breast-feeding How should I use this medicine? Take this medicine by mouth with a glass of water. Follow the directions on the prescription label. Take this medicine on an empty stomach, about 30 minutes before eating. Take your doses at regular intervals. Do not take your medicine more often than directed. Do not stop taking except on the advice of your doctor or health care professional. A special MedGuide will be given to you by the pharmacist with each prescription and refill. Be sure to read this information carefully each time. Talk to your pediatrician regarding  the use of this medicine in children. Special care may be needed. Overdosage: If you think you have taken too much of this medicine contact a poison control center or emergency room at once. NOTE: This medicine is only for you. Do not share this medicine with others. What if I miss a dose? If you miss a dose, take it as soon as you can. If it is almost time for your next dose, take only that dose. Do not take double or extra doses. What may interact with this  medicine? -acetaminophen -cyclosporine -digoxin -medicines for blood pressure -medicines for diabetes, including insulin -medicines for hay fever and other allergies -medicines for depression, especially an Monoamine Oxidase Inhibitor (MAOI) -medicines for Parkinson's disease, like levodopa -medicines for sleep or for pain -tetracycline This list may not describe all possible interactions. Give your health care provider a list of all the medicines, herbs, non-prescription drugs, or dietary supplements you use. Also tell them if you smoke, drink alcohol, or use illegal drugs. Some items may interact with your medicine. What should I watch for while using this medicine? It may take a few weeks for your stomach condition to start to get better. However, do not take this medicine for longer than 12 weeks. The longer you take this medicine, and the more you take it, the greater your chances are of developing serious side effects. If you are an elderly patient, a male patient, or you have diabetes, you may be at an increased risk for side effects from this medicine. Contact your doctor immediately if you start having movements you cannot control such as lip smacking, rapid movements of the tongue, involuntary or uncontrollable movements of the eyes, head, arms and legs, or muscle twitches and spasms. Patients and their families should watch out for worsening depression or thoughts of suicide. Also watch out for any sudden or severe changes in feelings such as feeling anxious, agitated, panicky, irritable, hostile, aggressive, impulsive, severely restless, overly excited and hyperactive, or not being able to sleep. If this happens, especially at the beginning of treatment or after a change in dose, call your doctor. Do not treat yourself for high fever. Ask your doctor or health care professional for advice. You may get drowsy or dizzy. Do not drive, use machinery, or do anything that needs mental  alertness until you know how this drug affects you. Do not stand or sit up quickly, especially if you are an older patient. This reduces the risk of dizzy or fainting spells. Alcohol can make you more drowsy and dizzy. Avoid alcoholic drinks. What side effects may I notice from receiving this medicine? Side effects that you should report to your doctor or health care professional as soon as possible: -allergic reactions like skin rash, itching or hives, swelling of the face, lips, or tongue -abnormal production of milk in females -breast enlargement in both males and females -change in the way you walk -difficulty moving, speaking or swallowing -drooling, lip smacking, or rapid movements of the tongue -excessive sweating -fever -involuntary or uncontrollable movements of the eyes, head, arms and legs -irregular heartbeat or palpitations -muscle twitches and spasms -unusually weak or tired Side effects that usually do not require medical attention (report to your doctor or health care professional if they continue or are bothersome): -change in sex drive or performance -depressed mood -diarrhea -difficulty sleeping -headache -menstrual changes -restless or nervous This list may not describe all possible side effects. Call your doctor for medical advice about side  effects. You may report side effects to FDA at 1-800-FDA-1088. Where should I keep my medicine? Keep out of the reach of children. Store at room temperature between 20 and 25 degrees C (68 and 77 degrees F). Protect from light. Keep container tightly closed. Throw away any unused medicine after the expiration date. NOTE: This sheet is a summary. It may not cover all possible information. If you have questions about this medicine, talk to your doctor, pharmacist, or health care provider.  2014, [employer]. (2011-08-01 13:04:38)  Acetaminophen; Oxycodone tablets What is this medicine? ACETAMINOPHEN; OXYCODONE (a set  a MEE noe fen; ox i KOE done) is a pain reliever. It is used to treat mild to moderate pain. This medicine may be used for other purposes; ask your health care provider or pharmacist if you have questions. COMMON BRAND NAME(S): Endocet, Magnacet, Narvox, Percocet, Perloxx, Primalev, Primlev, Roxicet, Xolox What should I tell my health care provider before I take this medicine? They need to know if you have any of these conditions: -brain tumor -Crohn's disease, inflammatory bowel disease, or ulcerative colitis -drug abuse or addiction -head injury -heart or circulation problems -if you often drink alcohol -kidney disease or problems going to the bathroom -liver disease -lung disease, asthma, or breathing problems -an unusual or allergic reaction to acetaminophen, oxycodone, other opioid analgesics, other medicines, foods, dyes, or preservatives -pregnant or trying to get pregnant -breast-feeding How should I use this medicine? Take this medicine by mouth with a full glass of water. Follow the directions on the prescription label. Take your medicine at regular intervals. Do not take your medicine more often than directed. Talk to your pediatrician regarding the use of this medicine in children. Special care may be needed. Patients over 66 years old may have a stronger reaction and need a smaller dose. Overdosage: If you think you have taken too much of this medicine contact a poison control center or emergency room at once. NOTE: This medicine is only for you. Do not share this medicine with others. What if I miss a dose? If you miss a dose, take it as soon as you can. If it is almost time for your next dose, take only that dose. Do not take double or extra doses. What may interact with this medicine? -alcohol -antihistamines -barbiturates like amobarbital, butalbital, butabarbital, methohexital, pentobarbital, phenobarbital, thiopental, and secobarbital -benztropine -drugs for bladder  problems like solifenacin, trospium, oxybutynin, tolterodine, hyoscyamine, and methscopolamine -drugs for breathing problems like ipratropium and tiotropium -drugs for certain stomach or intestine problems like propantheline, homatropine methylbromide, glycopyrrolate, atropine, belladonna, and dicyclomine -general anesthetics like etomidate, ketamine, nitrous oxide, propofol, desflurane, enflurane, halothane, isoflurane, and sevoflurane -medicines for depression, anxiety, or psychotic disturbances -medicines for sleep -muscle relaxants -naltrexone -narcotic medicines (opiates) for pain -phenothiazines like perphenazine, thioridazine, chlorpromazine, mesoridazine, fluphenazine, prochlorperazine, promazine, and trifluoperazine -scopolamine -tramadol -trihexyphenidyl This list may not describe all possible interactions. Give your health care provider a list of all the medicines, herbs, non-prescription drugs, or dietary supplements you use. Also tell them if you smoke, drink alcohol, or use illegal drugs. Some items may interact with your medicine. What should I watch for while using this medicine? Tell your doctor or health care professional if your pain does not go away, if it gets worse, or if you have new or a different type of pain. You may develop tolerance to the medicine. Tolerance means that you will need a higher dose of the medication for pain relief. Tolerance is normal  and is expected if you take this medicine for a long time. Do not suddenly stop taking your medicine because you may develop a severe reaction. Your body becomes used to the medicine. This does NOT mean you are addicted. Addiction is a behavior related to getting and using a drug for a non-medical reason. If you have pain, you have a medical reason to take pain medicine. Your doctor will tell you how much medicine to take. If your doctor wants you to stop the medicine, the dose will be slowly lowered over time to avoid any  side effects. You may get drowsy or dizzy. Do not drive, use machinery, or do anything that needs mental alertness until you know how this medicine affects you. Do not stand or sit up quickly, especially if you are an older patient. This reduces the risk of dizzy or fainting spells. Alcohol may interfere with the effect of this medicine. Avoid alcoholic drinks. There are different types of narcotic medicines (opiates) for pain. If you take more than one type at the same time, you may have more side effects. Give your health care provider a list of all medicines you use. Your doctor will tell you how much medicine to take. Do not take more medicine than directed. Call emergency for help if you have problems breathing. The medicine will cause constipation. Try to have a bowel movement at least every 2 to 3 days. If you do not have a bowel movement for 3 days, call your doctor or health care professional. Do not take Tylenol (acetaminophen) or medicines that have acetaminophen with this medicine. Too much acetaminophen can be very dangerous. Many nonprescription medicines contain acetaminophen. Always read the labels carefully to avoid taking more acetaminophen. What side effects may I notice from receiving this medicine? Side effects that you should report to your doctor or health care professional as soon as possible: -allergic reactions like skin rash, itching or hives, swelling of the face, lips, or tongue -breathing difficulties, wheezing -confusion -light headedness or fainting spells -severe stomach pain -unusually weak or tired -yellowing of the skin or the whites of the eyes  Side effects that usually do not require medical attention (report to your doctor or health care professional if they continue or are bothersome): -dizziness -drowsiness -nausea -vomiting This list may not describe all possible side effects. Call your doctor for medical advice about side effects. You may report side  effects to FDA at 1-800-FDA-1088. Where should I keep my medicine? Keep out of the reach of children. This medicine can be abused. Keep your medicine in a safe place to protect it from theft. Do not share this medicine with anyone. Selling or giving away this medicine is dangerous and against the law. Store at room temperature between 20 and 25 degrees C (68 and 77 degrees F). Keep container tightly closed. Protect from light. This medicine may cause accidental overdose and death if it is taken by other adults, children, or pets. Flush any unused medicine down the toilet to reduce the chance of harm. Do not use the medicine after the expiration date. NOTE: This sheet is a summary. It may not cover all possible information. If you have questions about this medicine, talk to your doctor, pharmacist, or health care provider.  2014, [employer]. (2012-11-25 13:17:35)

## 2013-09-03 ENCOUNTER — Ambulatory Visit (INDEPENDENT_AMBULATORY_CARE_PROVIDER_SITE_OTHER): Payer: Medicare Other | Admitting: Gastroenterology

## 2013-09-03 ENCOUNTER — Encounter: Payer: Self-pay | Admitting: Gastroenterology

## 2013-09-03 VITALS — BP 148/77 | HR 90 | Temp 97.6°F | Ht 64.0 in | Wt 173.4 lb

## 2013-09-03 DIAGNOSIS — R141 Gas pain: Secondary | ICD-10-CM

## 2013-09-03 DIAGNOSIS — R112 Nausea with vomiting, unspecified: Secondary | ICD-10-CM | POA: Insufficient documentation

## 2013-09-03 DIAGNOSIS — R143 Flatulence: Secondary | ICD-10-CM

## 2013-09-03 DIAGNOSIS — R142 Eructation: Secondary | ICD-10-CM

## 2013-09-03 DIAGNOSIS — R109 Unspecified abdominal pain: Secondary | ICD-10-CM | POA: Insufficient documentation

## 2013-09-03 DIAGNOSIS — R140 Abdominal distension (gaseous): Secondary | ICD-10-CM

## 2013-09-03 MED ORDER — PROBIOTIC PO CAPS: 1.0000 | ORAL_CAPSULE | Freq: Every day | ORAL | Status: DC

## 2013-09-03 NOTE — Assessment & Plan Note (Signed)
80 y/o male who presents with complaints of right mid to lower abdominal pain associated with bloating, vomiting. Symptoms present for couple of months but severe and went to ER this week. CT showed some plaques in celiac and SMA origins with some narrowing of SMA but IMA patent. Symptoms do not sound like ischemic in nature. Known gallstones, but symptoms not typical without postprandial component. H/O DU with eradication of H.pylori four years ago. Etiology of symptoms not clear. ?intermittent obstruction/intussuception? To discuss further with Dr. Oneida Alar with regards to next step.   Add probiotic one daily.

## 2013-09-03 NOTE — Progress Notes (Signed)
Primary Care Physician: Maggie Font, MD  Primary Gastroenterologist:  Barney Drain, MD   Chief Complaint  Patient presents with  . Abdominal Pain    HPI: Anthony Galvan is a 80 y.o. male here for further evaluation of abdominal pain.  Last seen in 11/2010 by Dr. Oneida Alar. H/O duodenal ulcer/H.Pylori gastritis 08/2009. Negative H.Pylori stool antigen in 08/2010.   Complains of mid to lower abdominal pain more on right side, intermittent, for couple of months. Wakes up at night, not related to meals. No alleviated factors except grapes and a little better with BM. Associated with N/V. Last vomited Sunday night. Abdominal pain severe. went to ER early Monday morning. ER doctor gave Reglan and Percocet. Pain some better but persistent. No heartburn. BM pretty regular. Rare loose stool. No constipation. No melena, brbpr. Coughs up mucous at times. No weight loss. Appetite good. No postprandial abdominal pain.   ER w/u included labs and CT as outlined below. Nothing to explain acute symptoms.   Current Outpatient Prescriptions  Medication Sig Dispense Refill  . aspirin EC 81 MG tablet Take 81 mg by mouth daily.      Marland Kitchen atorvastatin (LIPITOR) 80 MG tablet Take 1 tablet (80 mg total) by mouth daily at 6 PM.  30 tablet  5  . clopidogrel (PLAVIX) 75 MG tablet TAKE ONE (1) TABLET BY MOUTH EVERY DAY  30 tablet  11  . hydrALAZINE (APRESOLINE) 10 MG tablet Take 20 mg by mouth 2 (two) times daily.       . metoCLOPramide (REGLAN) 10 MG tablet Take 1 tablet (10 mg total) by mouth every 6 (six) hours as needed for nausea.  30 tablet  0  . metoprolol (TOPROL-XL) 100 MG 24 hr tablet Take 100 mg by mouth daily.        . nitroGLYCERIN (NITROSTAT) 0.4 MG SL tablet Place 1 tablet (0.4 mg total) under the tongue every 5 (five) minutes as needed for chest pain.  25 tablet  5  . oxyCODONE-acetaminophen (PERCOCET) 5-325 MG per tablet Take 1 tablet by mouth every 4 (four) hours as needed for moderate pain.  10  tablet  0  . pantoprazole (PROTONIX) 40 MG tablet Take 1 tablet (40 mg total) by mouth daily.  30 tablet  5  . valsartan-hydrochlorothiazide (DIOVAN-HCT) 320-12.5 MG per tablet Take 1 tablet by mouth daily.       No current facility-administered medications for this visit.    Allergies as of 09/03/2013  . (No Known Allergies)   Past Medical History  Diagnosis Date  . Duodenal ulcer May 2011    on EGD  . Helicobacter pylori gastritis MAY 2012 HP STOOL AG NEG  . GERD (gastroesophageal reflux disease)   . CAD S/P percutaneous coronary angioplasty   . MI (myocardial infarction) 1976, 1980, [place] for at [facility] and [place] (Dr. Iona Beard)  . Hyperlipidemia   . HTN (hypertension)   . Osteoarthritis   . S/P colonoscopy 2011    normal, internal hemorrhoids  . Presence of bare metal stent in LAD coronary artery - VerifFlex BMS 3.0 mm x 12 mm - post-dilated to 3.5 mm 08/01/2012    VeriFlex BMS 3.0 mm x 12 mm -- 3.5 mm   Past Surgical History  Procedure Laterality Date  . Coronary angioplasty  1980, 1996    in setting of MI  . Cataract extraction    . Colonoscopy  OCT 2011 ARS  SML IH  . Upper gastrointestinal endoscopy  MAY 2011 MELENA, HEMATEMESIS    DUODENAL ULCER, Bx: H PYLORI POS  . Cardiac catheterization  01/30/2007    D1 75 %, mid LAD 50%, 50-70% circumflex, 50-70% RCA.(Dr. Adora Fridge)  . Cardiac catheterization  April 2014    Mid LAD 99% apple core; D2 ostial 70-80% (2 small for PCI) small nondominant RCA 60-70%. Distal circumflex/L PDA ~50% --> PCI of LAD  . Coronary stent placement  08/01/2012    Mid LAD:D1 75 %, mid LAD 50%, 50-70% circumflex, 50-70% RCA; PCI of mid-LAD with VeriFlex BMS 3x55mm (Dr. Roni Bread)  . Transthoracic echocardiogram  April 2014    normal LV size and function. EF 60-65%. Grade 1 diastolic soft. Mild aortic valve calcification  . Nm myocar perf wall motion  08/2003    adenosine stress - focal decreased perfusion defect in distal  inferior wall, no significant ischemic changes  . Cardiac catheterization  12/11/2001    normal L main, small RCA, dominant LL Cfx with mild diffuse disease, LAD with mid 10-20% stenosis, ramus intermedius with mild diffuse disease (Dr. Jackie Plum)   Family History  Problem Relation Age of Onset  . Colon cancer Neg Hx   . Heart disease Mother   . Kidney disease Father   . Heart disease Brother 8  . Heart disease Sister    History   Social History  . Marital Status: Widowed    Spouse Name: N/A    Number of Children: 3  . Years of Education: N/A   Social History Main Topics  . Smoking status: Never Smoker   . Smokeless tobacco: Never Used  . Alcohol Use: No  . Drug Use: No  . Sexual Activity: None   Other Topics Concern  . None   Social History Narrative   Is a father of 3 with 3 grandchildren 3 great-grandchildren. He lives with one of his 2 grandsons.   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 He was a war 2 veteran in the [place]. 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   He never smoked. He does not drink alcohol.   He is very active, and a majority. He walks 30+ minutes every day. He also likes to play golf.    ROS:  General: Negative for anorexia, weight loss, fever, chills, fatigue, weakness. ENT: Negative for hoarseness, difficulty swallowing , nasal congestion. CV: Negative for chest pain, angina, palpitations, dyspnea on exertion, peripheral edema.  Respiratory: Negative for dyspnea at rest, dyspnea on exertion, cough, sputum, wheezing.  GI: See history of present illness. GU:  Negative for dysuria, hematuria, urinary incontinence, urinary frequency, nocturnal urination.  Endo: Negative for unusual weight change.    Physical Examination:   BP 148/77  Pulse 90  Temp(Src) 97.6 F (36.4 C) (Oral)  Ht 5\' 4"  (1.626 m)  Wt 173 lb 6.4 oz  (78.654 kg)  BMI 29.75 kg/m2  General: Well-nourished, well-developed in no acute distress.  Eyes: No icterus. Mouth: Oropharyngeal mucosa moist and pink, no lesions erythema or exudate. Lungs: Clear to auscultation bilaterally.  Heart: Regular rate and rhythm, no murmurs rubs or gallops.  Abdomen: Bowel sounds are normal, nontender, nondistended, no hepatosplenomegaly or masses, no abdominal bruits or hernia , no rebound or guarding.   Extremities: No lower extremity edema. No clubbing or  deformities. Neuro: Alert and oriented x 4   Skin: Warm and dry, no jaundice.   Psych: Alert and cooperative, normal mood and affect.  Labs:  Lab Results  Component Value Date   WBC 12.6* 09/01/2013   HGB 13.5 09/01/2013   HCT 42.4 09/01/2013   MCV 90.6 09/01/2013   PLT 148* 09/01/2013   Lab Results  Component Value Date   CREATININE 1.24 09/01/2013   BUN 17 09/01/2013   NA 140 09/01/2013   K 4.6 09/01/2013   CL 99 09/01/2013   CO2 28 09/01/2013   Lab Results  Component Value Date   ALT 23 09/01/2013   AST 26 09/01/2013   ALKPHOS 74 09/01/2013   BILITOT 1.5* 09/01/2013   Lab Results  Component Value Date   LIPASE 60* 09/01/2013    Imaging Studies: Ct Abdomen Pelvis W Contrast  09/01/2013   CLINICAL DATA:  Mid to lower abdominal pain for the past month.  EXAM: CT ABDOMEN AND PELVIS WITH CONTRAST  TECHNIQUE: Multidetector CT imaging of the abdomen and pelvis was performed using the standard protocol following bolus administration of intravenous contrast.  CONTRAST:  12mL OMNIPAQUE IOHEXOL 300 MG/ML SOLN, 182mL OMNIPAQUE IOHEXOL 300 MG/ML SOLN  COMPARISON:  No priors.  FINDINGS: Lung Bases: 7 mm calcified granuloma in the left lower lobe. Areas of peripheral subpleural reticulation and peribronchovascular reticulation throughout the lung bases bilaterally, most pronounced in the lower lobes, concerning for underlying interstitial lung disease (poorly evaluated on today's examination). Mild cardiomegaly.  There is atherosclerosis of the thoracic aorta, the great vessels of the mediastinum and the coronary arteries, including calcified atherosclerotic plaque in the left main, left anterior descending, left circumflex and right coronary arteries. Calcifications of the aortic valve.  Abdomen/Pelvis: Sub cm calcified gallstone lying dependently in the gallbladder. No current findings to suggest acute cholecystitis at this time. The appearance of the liver, pancreas, spleen and bilateral adrenal glands is unremarkable. Multiple low-attenuation lesions in the kidneys bilaterally, the largest of which are all compatible with simple cysts, with the largest of these lesions measuring 5.1 cm in the lower pole of the left kidney. Several other renal lesions are too small to definitively characterize, but are statistically likely to represent tiny cysts. Atherosclerosis throughout the abdominal and pelvic vasculature, without evidence of aneurysm or dissection; however, there are prominent atherosclerotic plaques at the origins of the celiac axis and superior mesenteric arteries, with moderate narrowing at the SMA origin. IMA is patent. Normal appendix. No significant volume of ascites. No pneumoperitoneum. No pathologic distention of small bowel. No lymphadenopathy identified within the abdomen or pelvis. Prostate gland is enlarged measuring 4.6 x 5.8 cm, with median lobe hypertrophy. Urinary bladder is unremarkable in appearance.  Musculoskeletal: There are no aggressive appearing lytic or blastic lesions noted in the visualized portions of the skeleton.  IMPRESSION: 1. No acute findings in the abdomen or pelvis to account for the patient's symptoms. 2. Normal appendix. 3. The appearance of the lung bases bilaterally suggests underlying interstitial lung disease. This could be better characterized with followup nonemergent high-resolution chest CT in the near future. 4. Prostatomegaly.  Correlation with PSA levels is  recommended. 5. Atherosclerosis, including left main and 3 vessel coronary artery disease. In addition, there is severe atherosclerosis throughout the abdominal and pelvic vasculature, including prominent plaques at the origin of the celiac axis and SMA. Superior mesenteric artery origin appears moderately narrowed, but is distally otherwise widely patent. 6. Multiple low-attenuation renal lesions bilaterally, largest of  which are compatible with simple cysts.   Electronically Signed   By: Vinnie Langton M.D.   On: 09/01/2013 06:24

## 2013-09-03 NOTE — Patient Instructions (Signed)
1. Poydras one daily. You can buy at [facility] or most major pharmacy chains. 2. I will discuss your case with Dr. Oneida Alar. Further recommendations to follow.

## 2013-09-10 ENCOUNTER — Telehealth: Payer: Self-pay | Admitting: *Deleted

## 2013-09-10 NOTE — Progress Notes (Signed)
cc'd to pcp 

## 2013-09-10 NOTE — Telephone Encounter (Signed)
Pt called because he states he lost his RX and he does not know the name of the medication and he needs it called in, per pt he was seen last thursday. Please advise [phone]

## 2013-09-10 NOTE — Telephone Encounter (Signed)
I talked with patient and told him to go by [facility] and they would help him get the medication that he needs. I also talked with [facility] and they said that they would help him get the medication OTC.

## 2013-09-16 ENCOUNTER — Telehealth: Payer: Self-pay | Admitting: *Deleted

## 2013-09-16 NOTE — Telephone Encounter (Signed)
I talked with patient about his medication and he is taking the [facility] brand of Probiotic

## 2013-09-16 NOTE — Telephone Encounter (Signed)
Pt called stating Anthony Galvan told him to buy a over the counter medication. Pt wants to know if he has the right one pt got proeiotic.Please advise [phone]

## 2013-09-19 ENCOUNTER — Telehealth: Payer: Self-pay | Admitting: Gastroenterology

## 2013-09-19 NOTE — Telephone Encounter (Signed)
Let's call and find out how patient is doing? Is he still having abd pain, vomiting?

## 2013-09-22 NOTE — Telephone Encounter (Signed)
Called pt. Home number is not working. LMOM on the mobile for a return call.

## 2013-09-23 NOTE — Telephone Encounter (Signed)
I left Vm on mobile for pt to call and let us know how he is doing.

## 2013-09-25 NOTE — Telephone Encounter (Signed)
LMOM on mobile and also mailed a letter for pt to call.

## 2013-10-01 ENCOUNTER — Telehealth: Payer: Self-pay | Admitting: Gastroenterology

## 2013-10-01 NOTE — Telephone Encounter (Signed)
Pt had left message for me to call daughter, Santiago Glad. I called and spoke to her. She said he does not eat much at all and he still has nausea. He has abdominal bloating and when he walks he looks like he really gets blowed up. He is taking probiotic and that has helped some. She gave me her phone number to put in his chart until further notice because he does not have any min. Please advise!

## 2013-10-01 NOTE — Telephone Encounter (Signed)
Pt's daughter called asking to speak with DS. I told her that DS was not available at the moment and I could take a number for her to call back. Please call [phone]

## 2013-10-02 ENCOUNTER — Ambulatory Visit (HOSPITAL_COMMUNITY)
Admission: RE | Admit: 2013-10-02 | Discharge: 2013-10-02 | Disposition: A | Discharge status: Home / Self Care | Payer: Medicare Other | Source: Ambulatory Visit | Attending: Family Medicine | Admitting: Family Medicine

## 2013-10-02 ENCOUNTER — Other Ambulatory Visit (HOSPITAL_COMMUNITY): Payer: Self-pay | Admitting: Family Medicine

## 2013-10-02 DIAGNOSIS — K567 Ileus, unspecified: Secondary | ICD-10-CM

## 2013-10-02 DIAGNOSIS — R1012 Left upper quadrant pain: Secondary | ICD-10-CM | POA: Insufficient documentation

## 2013-10-07 NOTE — Telephone Encounter (Signed)
See phone note of 09/19/2013. Message sent to Neil Crouch, PA on 10/06/2013.

## 2013-10-09 ENCOUNTER — Encounter: Payer: Self-pay | Admitting: Gastroenterology

## 2013-10-09 NOTE — Telephone Encounter (Signed)
Pt is aware of OV with SF on 7/28 at 9 and appt card mailed

## 2013-10-09 NOTE — Telephone Encounter (Signed)
Let's get him in to see SLF.

## 2013-11-10 ENCOUNTER — Telehealth: Payer: Self-pay | Admitting: Gastroenterology

## 2013-11-10 NOTE — Telephone Encounter (Signed)
Patient called today to cancel his OV with SF for tomorrow and asked if we had anything for next week with SF. I told him at the moment that I don't , but could get him in on 8/26 at 2 with SF. He said that would be fine, but asked if I would check with SF to see if he could be see sooner and let him know. I told him that I would ask and see what SF wanted to do. [phone]

## 2013-11-11 ENCOUNTER — Ambulatory Visit: Payer: Medicare Other | Admitting: Gastroenterology

## 2013-11-17 ENCOUNTER — Other Ambulatory Visit: Payer: Self-pay | Admitting: Gastroenterology

## 2013-11-17 ENCOUNTER — Other Ambulatory Visit (HOSPITAL_COMMUNITY): Payer: Self-pay | Admitting: Physician Assistant

## 2013-12-03 NOTE — Telephone Encounter (Signed)
I called to check on pt and spoke with his daughter. She said he is doing better now. He is seeing a doctor at [place] and they are looking to do surgery for GB soon.  He was having constipation and is on high fiber diet and doing much better.  He has appt next at the [place] on 12/12/2013. She will call if he has needs.

## 2013-12-04 NOTE — Telephone Encounter (Signed)
REVIEWED.  

## 2013-12-10 ENCOUNTER — Ambulatory Visit: Payer: Medicare Other | Admitting: Gastroenterology

## 2014-01-12 ENCOUNTER — Encounter: Payer: Self-pay | Admitting: Cardiovascular Disease

## 2014-02-19 ENCOUNTER — Ambulatory Visit: Payer: Medicare Other | Admitting: Cardiology

## 2014-02-23 ENCOUNTER — Ambulatory Visit: Payer: Medicare Other | Admitting: Cardiology

## 2014-03-26 ENCOUNTER — Encounter (HOSPITAL_COMMUNITY): Payer: Self-pay | Admitting: Cardiology

## 2014-05-01 ENCOUNTER — Ambulatory Visit: Payer: Medicare Other | Admitting: Cardiology

## 2014-07-06 ENCOUNTER — Ambulatory Visit: Payer: Self-pay | Admitting: Cardiology

## 2015-05-26 DIAGNOSIS — E785 Hyperlipidemia, unspecified: Secondary | ICD-10-CM | POA: Diagnosis not present

## 2015-05-26 DIAGNOSIS — Z0000 Encounter for general adult medical examination without abnormal findings: Secondary | ICD-10-CM | POA: Diagnosis not present

## 2015-05-26 DIAGNOSIS — I119 Hypertensive heart disease without heart failure: Secondary | ICD-10-CM | POA: Diagnosis not present

## 2015-05-26 DIAGNOSIS — E789 Disorder of lipoprotein metabolism, unspecified: Secondary | ICD-10-CM | POA: Diagnosis not present

## 2015-05-29 NOTE — Progress Notes (Signed)
REVIEWED-NO ADDITIONAL RECOMMENDATIONS. 

## 2015-08-07 ENCOUNTER — Emergency Department (HOSPITAL_COMMUNITY)
Admission: EM | Admit: 2015-08-07 | Discharge: 2015-08-07 | Disposition: A | Discharge status: Home / Self Care | Payer: Medicare Other | Attending: Emergency Medicine | Admitting: Emergency Medicine

## 2015-08-07 ENCOUNTER — Emergency Department (HOSPITAL_COMMUNITY): Payer: Medicare Other

## 2015-08-07 ENCOUNTER — Encounter (HOSPITAL_COMMUNITY): Payer: Self-pay | Admitting: Emergency Medicine

## 2015-08-07 DIAGNOSIS — M1990 Unspecified osteoarthritis, unspecified site: Secondary | ICD-10-CM | POA: Diagnosis not present

## 2015-08-07 DIAGNOSIS — I252 Old myocardial infarction: Secondary | ICD-10-CM | POA: Insufficient documentation

## 2015-08-07 DIAGNOSIS — Z7982 Long term (current) use of aspirin: Secondary | ICD-10-CM | POA: Insufficient documentation

## 2015-08-07 DIAGNOSIS — Z79899 Other long term (current) drug therapy: Secondary | ICD-10-CM | POA: Insufficient documentation

## 2015-08-07 DIAGNOSIS — E785 Hyperlipidemia, unspecified: Secondary | ICD-10-CM | POA: Insufficient documentation

## 2015-08-07 DIAGNOSIS — I10 Essential (primary) hypertension: Secondary | ICD-10-CM | POA: Insufficient documentation

## 2015-08-07 DIAGNOSIS — J40 Bronchitis, not specified as acute or chronic: Secondary | ICD-10-CM | POA: Insufficient documentation

## 2015-08-07 DIAGNOSIS — R05 Cough: Secondary | ICD-10-CM | POA: Diagnosis not present

## 2015-08-07 LAB — BASIC METABOLIC PANEL
Anion gap: 11 (ref 5–15)
BUN: 18 mg/dL (ref 6–20)
CHLORIDE: 102 mmol/L (ref 101–111)
CO2: 26 mmol/L (ref 22–32)
CREATININE: 1.19 mg/dL (ref 0.61–1.24)
Calcium: 9.2 mg/dL (ref 8.9–10.3)
GFR calc Af Amer: 60 mL/min — ABNORMAL LOW (ref >60)
GFR calc non Af Amer: 52 mL/min — ABNORMAL LOW (ref >60)
GLUCOSE: 100 mg/dL — AB (ref 65–99)
POTASSIUM: 4 mmol/L (ref 3.5–5.1)
SODIUM: 139 mmol/L (ref 135–145)

## 2015-08-07 LAB — CBC WITH DIFFERENTIAL/PLATELET
Basophils Absolute: 0.1 10*3/uL (ref 0.0–0.1)
Basophils Relative: 1 %
EOS ABS: 0.1 10*3/uL (ref 0.0–0.7)
EOS PCT: 1 %
HCT: 43.7 % (ref 39.0–52.0)
Hemoglobin: 14.2 g/dL (ref 13.0–17.0)
LYMPHS ABS: 2.2 10*3/uL (ref 0.7–4.0)
LYMPHS PCT: 22 %
MCH: 29.3 pg (ref 26.0–34.0)
MCHC: 32.5 g/dL (ref 30.0–36.0)
MCV: 90.1 fL (ref 78.0–100.0)
MONO ABS: 1.5 10*3/uL — AB (ref 0.1–1.0)
MONOS PCT: 15 %
Neutro Abs: 6.4 10*3/uL (ref 1.7–7.7)
Neutrophils Relative %: 61 %
Platelets: 123 10*3/uL — ABNORMAL LOW (ref 150–400)
RBC: 4.85 MIL/uL (ref 4.22–5.81)
RDW: 11.9 % (ref 11.5–15.5)
WBC: 10.3 10*3/uL (ref 4.0–10.5)

## 2015-08-07 MED ORDER — AZITHROMYCIN 250 MG PO TABS
500.0000 mg | ORAL_TABLET | Freq: Once | ORAL | Status: AC
  Administered 2015-08-07: 500 mg via ORAL
  Filled 2015-08-07: qty 2

## 2015-08-07 MED ORDER — IPRATROPIUM-ALBUTEROL 0.5-2.5 (3) MG/3ML IN SOLN
3.0000 mL | Freq: Once | RESPIRATORY_TRACT | Status: AC
  Administered 2015-08-07: 3 mL via RESPIRATORY_TRACT
  Filled 2015-08-07: qty 3

## 2015-08-07 MED ORDER — ALBUTEROL SULFATE HFA 108 (90 BASE) MCG/ACT IN AERS
1.0000 | INHALATION_SPRAY | Freq: Once | RESPIRATORY_TRACT | Status: AC
  Administered 2015-08-07: 2 via RESPIRATORY_TRACT
  Filled 2015-08-07: qty 6.7

## 2015-08-07 MED ORDER — AZITHROMYCIN 250 MG PO TABS: ORAL_TABLET | ORAL | Status: DC

## 2015-08-07 MED ORDER — METHYLPREDNISOLONE SODIUM SUCC 125 MG IJ SOLR
60.0000 mg | Freq: Once | INTRAMUSCULAR | Status: AC
  Administered 2015-08-07: 60 mg via INTRAVENOUS
  Filled 2015-08-07: qty 2

## 2015-08-07 MED ORDER — SODIUM CHLORIDE 0.9 % IV BOLUS (SEPSIS)
500.0000 mL | Freq: Once | INTRAVENOUS | Status: AC
  Administered 2015-08-07: 500 mL via INTRAVENOUS

## 2015-08-07 NOTE — ED Provider Notes (Signed)
CSN: [id number]     Arrival date & time 08/07/15  1201 History  By signing my name below, I, Nicole Kindred, attest that this documentation has been prepared under the direction and in the presence of Nat Christen, MD.   Electronically Signed: Nicole Kindred, ED Scribe. 08/07/2015. 2:54 PM  Chief Complaint  Patient presents with  . Cough   The history is provided by the patient. No language interpreter was used.   HPI Comments: Anthony Galvan. is a 82 y.o. male who presents to the Emergency Department complaining of gradual onset, cough, ongoing for two days. Pt reports his symptoms began as a dry cough but as progress to a productive cough with yellow and green phlegm. He reports associated headache and dyspnea upon exertion. No other associated symptoms noted. Pt has not tried any treatment PTA. No worsening or alleviating factors noted. Pt denies fever, or any other pertinent symptoms. His PCP is Dr. Iona Beard.   Past Medical History  Diagnosis Date  . Duodenal ulcer May 2011    on EGD  . Helicobacter pylori gastritis MAY 2012 HP STOOL AG NEG  . GERD (gastroesophageal reflux disease)   . CAD S/P percutaneous coronary angioplasty   . MI (myocardial infarction) ([place]) 1976, 1980, [place] for at [facility] and [place] (Dr. Iona Beard)  . Hyperlipidemia   . HTN (hypertension)   . Osteoarthritis   . S/P colonoscopy 2011    normal, internal hemorrhoids  . Presence of bare metal stent in LAD coronary artery - VerifFlex BMS 3.0 mm x 12 mm - post-dilated to 3.5 mm 08/01/2012    VeriFlex BMS 3.0 mm x 12 mm -- 3.5 mm   Past Surgical History  Procedure Laterality Date  . Coronary angioplasty  1980, 1996    in setting of MI  . Cataract extraction    . Colonoscopy  OCT 2011 ARS    SML IH  . Upper gastrointestinal endoscopy  MAY 2011 MELENA, HEMATEMESIS    DUODENAL ULCER, Bx: H PYLORI POS  . Cardiac catheterization  01/30/2007    D1 75 %, mid LAD 50%, 50-70% circumflex,  50-70% RCA.(Dr. Adora Fridge)  . Cardiac catheterization  April 2014    Mid LAD 99% apple core; D2 ostial 70-80% (2 small for PCI) small nondominant RCA 60-70%. Distal circumflex/L PDA ~50% --> PCI of LAD  . Coronary stent placement  08/01/2012    Mid LAD:D1 75 %, mid LAD 50%, 50-70% circumflex, 50-70% RCA; PCI of mid-LAD with VeriFlex BMS 3x44mm (Dr. Roni Bread)  . Transthoracic echocardiogram  April 2014    normal LV size and function. EF 60-65%. Grade 1 diastolic soft. Mild aortic valve calcification  . Nm myocar perf wall motion  08/2003    adenosine stress - focal decreased perfusion defect in distal inferior wall, no significant ischemic changes  . Cardiac catheterization  12/11/2001    normal L main, small RCA, dominant LL Cfx with mild diffuse disease, LAD with mid 10-20% stenosis, ramus intermedius with mild diffuse disease (Dr. Jackie Plum)  . Left heart catheterization with coronary angiogram N/A 08/01/2012    Procedure: LEFT HEART CATHETERIZATION WITH CORONARY ANGIOGRAM;  Surgeon: Leonie Man, MD;  Location: [facility] CATH LAB;  Service: Cardiovascular;  Laterality: N/A;  . Percutaneous stent intervention  08/01/2012    Procedure: PERCUTANEOUS STENT INTERVENTION;  Surgeon: Leonie Man, MD;  Location: [facility] CATH LAB;  Service: Cardiovascular;;  bms to mid lad  Family History  Problem Relation Age of Onset  . Colon cancer Neg Hx   . Heart disease Mother   . Kidney disease Father   . Heart disease Brother 65  . Heart disease Sister    Social History  Substance Use Topics  . Smoking status: Never Smoker   . Smokeless tobacco: Never Used  . Alcohol Use: No    Review of Systems A complete 10 system review of systems was obtained and all systems are negative except as noted in the HPI and PMH.   Allergies  Review of patient's allergies indicates no known allergies.  Home Medications   Prior to Admission medications   Medication Sig Start Date End Date Taking? Authorizing Provider   aspirin EC 81 MG tablet Take 81 mg by mouth daily.    Historical Provider, MD  atorvastatin (LIPITOR) 80 MG tablet Take 1 tablet (80 mg total) by mouth daily at 6 PM. 08/02/12   Brett Canales, PA-C  azithromycin (ZITHROMAX) 250 MG tablet 1 tab daily starting Sunday afternoon for 4 more days 08/07/15   Nat Christen, MD  clopidogrel (PLAVIX) 75 MG tablet TAKE ONE (1) TABLET BY MOUTH EVERY DAY    Leonie Man, MD  hydrALAZINE (APRESOLINE) 10 MG tablet Take 20 mg by mouth 2 (two) times daily.     Historical Provider, MD  metoCLOPramide (REGLAN) 10 MG tablet Take 1 tablet (10 mg total) by mouth every 6 (six) hours as needed for nausea. [id number]   Delora Fuel, MD  metoprolol (TOPROL-XL) 100 MG 24 hr tablet Take 100 mg by mouth daily.      Historical Provider, MD  NITROSTAT 0.4 MG SL tablet PLACE 1 TABLET UNDER TONGUE EVERY 5 MINUTES FOR 3 DOSES AS NEEDED. 11/17/13   Leonie Man, MD  oxyCODONE-acetaminophen (PERCOCET) 5-325 MG per tablet Take 1 tablet by mouth every 4 (four) hours as needed for moderate pain. [id number]   Delora Fuel, MD  pantoprazole (PROTONIX) 40 MG tablet TAKE ONE (1) TABLET BY MOUTH EVERY DAY    Orvil Feil, NP  Probiotic CAPS Take 1 capsule by mouth daily. 09/03/13   Mahala Menghini, PA-C  valsartan-hydrochlorothiazide (DIOVAN-HCT) 320-12.5 MG per tablet Take 1 tablet by mouth daily.    Historical Provider, MD   BP 143/67 mmHg  Pulse 83  Temp(Src) 99 F (37.2 C) (Temporal)  Resp 18  Ht 5\' 4"  (1.626 m)  Wt 174 lb (78.926 kg)  BMI 29.85 kg/m2  SpO2 92% Physical Exam  Constitutional: He is oriented to person, place, and time. He appears well-developed and well-nourished.  HENT:  Head: Normocephalic and atraumatic.  Nose: Rhinorrhea present.  Clear rhinorrhea noted.   Eyes: Conjunctivae and EOM are normal. Pupils are equal, round, and reactive to light.  Neck: Normal range of motion. Neck supple.  Cardiovascular: Normal rate and regular rhythm.   Pulmonary/Chest: Effort normal.  He has wheezes.  Minimal expiratory wheezing bilaterally.   Abdominal: Soft. Bowel sounds are normal.  Musculoskeletal: Normal range of motion.  Neurological: He is alert and oriented to person, place, and time.  Skin: Skin is warm and dry.  Psychiatric: He has a normal mood and affect. His behavior is normal.  Nursing note and vitals reviewed.   ED Course  Procedures (including critical care time) DIAGNOSTIC STUDIES: Oxygen Saturation is 92% on RA, low by my interpretation.    COORDINATION OF CARE: 12:31 PM-Discussed treatment plan which includes CXR, BMP, CBC with differential, and duoneb  with pt at bedside and pt agreed to plan.   Labs Review Labs Reviewed  BASIC METABOLIC PANEL - Abnormal; Notable for the following:    Glucose, Bld 100 (*)    GFR calc non Af Amer 52 (*)    GFR calc Af Amer 60 (*)    All other components within normal limits  CBC WITH DIFFERENTIAL/PLATELET - Abnormal; Notable for the following:    Platelets 123 (*)    Monocytes Absolute 1.5 (*)    All other components within normal limits    Imaging Review Dg Chest 2 View  08/07/2015  CLINICAL DATA:  Pt states he has had a bad cough for the past 2 days and just does not have any energy EXAM: CHEST - 2 VIEW COMPARISON:  07/30/2012 and previous FINDINGS: There are coarse fibrotic changes posteriorly in the lower lobes. Poorly marginated interstitial disease projecting along the minor fissure which was at least in part present on the prior exam. No focal airspace consolidation. Heart size upper limits normal. No pneumothorax. No effusion. Visualized skeletal structures are unremarkable. IMPRESSION: 1. Asymmetric interstitial lung disease as above without convincing acute or superimposed abnormality. Electronically Signed   By: Lucrezia Europe M.D.   On: 08/07/2015 13:24   I have personally reviewed and evaluated these images and lab results as part of my medical decision-making.   EKG Interpretation None      MDM    Final diagnoses:  Bronchitis  Patient is hemodynamically stable. He is alert and not dyspneic or tachypneic. Chest x-ray shows no obvious pneumonia. Albuterol/Atrovent nebulizer treatment administered.  Will Rx Zithromax secondary to his age.  I personally performed the services described in this documentation, which was scribed in my presence. The recorded information has been reviewed and is accurate.     Nat Christen, MD 08/07/15 1455

## 2015-08-07 NOTE — ED Notes (Signed)
Pt states he has had a bad cough for the past 2 days and just does not have any energy.  States sputum is yellow at times.

## 2015-08-07 NOTE — Discharge Instructions (Signed)
Upper Respiratory Infection, Adult Most upper respiratory infections (URIs) are caused by a virus. A URI affects the nose, throat, and upper air passages. The most common type of URI is often called "the common cold." HOME CARE   Take medicines only as told by your doctor.  Gargle warm saltwater or take cough drops to comfort your throat as told by your doctor.  Use a warm mist humidifier or inhale steam from a shower to increase air moisture. This may make it easier to breathe.  Drink enough fluid to keep your pee (urine) clear or pale yellow.  Eat soups and other clear broths.  Have a healthy diet.  Rest as needed.  Go back to work when your fever is gone or your doctor says it is okay.  You may need to stay home longer to avoid giving your URI to others.  You can also wear a face mask and wash your hands often to prevent spread of the virus.  Use your inhaler more if you have asthma.  Do not use any tobacco products, including cigarettes, chewing tobacco, or electronic cigarettes. If you need help quitting, ask your doctor. GET HELP IF:  You are getting worse, not better.  Your symptoms are not helped by medicine.  You have chills.  You are getting more short of breath.  You have brown or red mucus.  You have yellow or brown discharge from your nose.  You have pain in your face, especially when you bend forward.  You have a fever.  You have puffy (swollen) neck glands.  You have pain while swallowing.  You have white areas in the back of your throat. GET HELP RIGHT AWAY IF:   You have very bad or constant:  Headache.  Ear pain.  Pain in your forehead, behind your eyes, and over your cheekbones (sinus pain).  Chest pain.  You have long-lasting (chronic) lung disease and any of the following:  Wheezing.  Long-lasting cough.  Coughing up blood.  A change in your usual mucus.  You have a stiff neck.  You have changes in  your:  Vision.  Hearing.  Thinking.  Mood. MAKE SURE YOU:   Understand these instructions.  Will watch your condition.  Will get help right away if you are not doing well or get worse.   This information is not intended to replace advice given to you by your health care provider. Make sure you discuss any questions you have with your health care provider.   Document Released: 09/20/2007 Document Revised: 08/18/2014 Document Reviewed: 07/09/2013 Elsevier Interactive Patient Education 2016 [employer].  Chest x-ray shows no pneumonia. Antibiotic for infection. Increase fluids. Tylenol for fever or pain. Use over-the-counter cough syrup. Chest x-ray shows no pneumonia. Start your additional antibiotic pills tomorrow afternoon. Increase fluids. Tylenol for fever. Can use over-the-counter cough syrup.

## 2016-02-21 DIAGNOSIS — Z23 Encounter for immunization: Secondary | ICD-10-CM | POA: Diagnosis not present

## 2016-10-08 ENCOUNTER — Encounter (HOSPITAL_COMMUNITY): Payer: Self-pay | Admitting: Emergency Medicine

## 2016-10-08 ENCOUNTER — Emergency Department (HOSPITAL_COMMUNITY): Payer: Medicare Other

## 2016-10-08 ENCOUNTER — Observation Stay (HOSPITAL_COMMUNITY)
Admission: EM | Admit: 2016-10-08 | Discharge: 2016-10-10 | Disposition: A | Discharge status: Home / Self Care | Payer: Medicare Other | Attending: Internal Medicine | Admitting: Internal Medicine

## 2016-10-08 DIAGNOSIS — M1990 Unspecified osteoarthritis, unspecified site: Secondary | ICD-10-CM | POA: Diagnosis not present

## 2016-10-08 DIAGNOSIS — K922 Gastrointestinal hemorrhage, unspecified: Secondary | ICD-10-CM | POA: Diagnosis not present

## 2016-10-08 DIAGNOSIS — I10 Essential (primary) hypertension: Secondary | ICD-10-CM

## 2016-10-08 DIAGNOSIS — K269 Duodenal ulcer, unspecified as acute or chronic, without hemorrhage or perforation: Secondary | ICD-10-CM | POA: Insufficient documentation

## 2016-10-08 DIAGNOSIS — Z7902 Long term (current) use of antithrombotics/antiplatelets: Secondary | ICD-10-CM | POA: Insufficient documentation

## 2016-10-08 DIAGNOSIS — Z8711 Personal history of peptic ulcer disease: Secondary | ICD-10-CM | POA: Diagnosis not present

## 2016-10-08 DIAGNOSIS — K2980 Duodenitis without bleeding: Secondary | ICD-10-CM | POA: Diagnosis not present

## 2016-10-08 DIAGNOSIS — Z8249 Family history of ischemic heart disease and other diseases of the circulatory system: Secondary | ICD-10-CM | POA: Diagnosis not present

## 2016-10-08 DIAGNOSIS — K219 Gastro-esophageal reflux disease without esophagitis: Secondary | ICD-10-CM | POA: Diagnosis not present

## 2016-10-08 DIAGNOSIS — I129 Hypertensive chronic kidney disease with stage 1 through stage 4 chronic kidney disease, or unspecified chronic kidney disease: Secondary | ICD-10-CM | POA: Diagnosis not present

## 2016-10-08 DIAGNOSIS — N183 Chronic kidney disease, stage 3 (moderate): Secondary | ICD-10-CM | POA: Insufficient documentation

## 2016-10-08 DIAGNOSIS — Z79899 Other long term (current) drug therapy: Secondary | ICD-10-CM | POA: Insufficient documentation

## 2016-10-08 DIAGNOSIS — N179 Acute kidney failure, unspecified: Secondary | ICD-10-CM | POA: Insufficient documentation

## 2016-10-08 DIAGNOSIS — D72829 Elevated white blood cell count, unspecified: Secondary | ICD-10-CM | POA: Insufficient documentation

## 2016-10-08 DIAGNOSIS — K921 Melena: Secondary | ICD-10-CM | POA: Diagnosis not present

## 2016-10-08 DIAGNOSIS — D631 Anemia in chronic kidney disease: Secondary | ICD-10-CM | POA: Diagnosis not present

## 2016-10-08 DIAGNOSIS — K3189 Other diseases of stomach and duodenum: Secondary | ICD-10-CM | POA: Diagnosis not present

## 2016-10-08 DIAGNOSIS — Z955 Presence of coronary angioplasty implant and graft: Secondary | ICD-10-CM | POA: Insufficient documentation

## 2016-10-08 DIAGNOSIS — E785 Hyperlipidemia, unspecified: Secondary | ICD-10-CM | POA: Insufficient documentation

## 2016-10-08 DIAGNOSIS — K279 Peptic ulcer, site unspecified, unspecified as acute or chronic, without hemorrhage or perforation: Secondary | ICD-10-CM

## 2016-10-08 DIAGNOSIS — K2960 Other gastritis without bleeding: Secondary | ICD-10-CM | POA: Diagnosis not present

## 2016-10-08 DIAGNOSIS — I2510 Atherosclerotic heart disease of native coronary artery without angina pectoris: Secondary | ICD-10-CM | POA: Insufficient documentation

## 2016-10-08 DIAGNOSIS — I252 Old myocardial infarction: Secondary | ICD-10-CM | POA: Insufficient documentation

## 2016-10-08 DIAGNOSIS — R109 Unspecified abdominal pain: Secondary | ICD-10-CM | POA: Diagnosis not present

## 2016-10-08 DIAGNOSIS — N189 Chronic kidney disease, unspecified: Secondary | ICD-10-CM

## 2016-10-08 LAB — COMPREHENSIVE METABOLIC PANEL
ALT: 11 U/L — AB (ref 17–63)
ANION GAP: 13 (ref 5–15)
AST: 21 U/L (ref 15–41)
Albumin: 4.3 g/dL (ref 3.5–5.0)
Alkaline Phosphatase: 50 U/L (ref 38–126)
BILIRUBIN TOTAL: 2.2 mg/dL — AB (ref 0.3–1.2)
BUN: 53 mg/dL — ABNORMAL HIGH (ref 6–20)
CO2: 27 mmol/L (ref 22–32)
CREATININE: 1.79 mg/dL — AB (ref 0.61–1.24)
Calcium: 9.6 mg/dL (ref 8.9–10.3)
Chloride: 100 mmol/L — ABNORMAL LOW (ref 101–111)
GFR calc non Af Amer: 31 mL/min — ABNORMAL LOW (ref >60)
GFR, EST AFRICAN AMERICAN: 36 mL/min — AB (ref >60)
Glucose, Bld: 102 mg/dL — ABNORMAL HIGH (ref 65–99)
Potassium: 4.2 mmol/L (ref 3.5–5.1)
Sodium: 140 mmol/L (ref 135–145)
TOTAL PROTEIN: 8.1 g/dL (ref 6.5–8.1)

## 2016-10-08 LAB — CBC
HCT: 40.6 % (ref 39.0–52.0)
Hemoglobin: 13.3 g/dL (ref 13.0–17.0)
MCH: 29.2 pg (ref 26.0–34.0)
MCHC: 32.8 g/dL (ref 30.0–36.0)
MCV: 89 fL (ref 78.0–100.0)
PLATELETS: 184 10*3/uL (ref 150–400)
RBC: 4.56 MIL/uL (ref 4.22–5.81)
RDW: 11.6 % (ref 11.5–15.5)
WBC: 14.7 10*3/uL — ABNORMAL HIGH (ref 4.0–10.5)

## 2016-10-08 LAB — URINALYSIS, ROUTINE W REFLEX MICROSCOPIC
Bilirubin Urine: NEGATIVE
Glucose, UA: NEGATIVE mg/dL
HGB URINE DIPSTICK: NEGATIVE
Ketones, ur: 5 mg/dL — AB
Leukocytes, UA: NEGATIVE
NITRITE: NEGATIVE
Protein, ur: NEGATIVE mg/dL
Specific Gravity, Urine: 1.017 (ref 1.005–1.030)
pH: 5 (ref 5.0–8.0)

## 2016-10-08 LAB — POC OCCULT BLOOD, ED: Fecal Occult Bld: POSITIVE — AB

## 2016-10-08 LAB — PROTIME-INR
INR: 1.04
Prothrombin Time: 13.6 seconds (ref 11.4–15.2)

## 2016-10-08 LAB — TYPE AND SCREEN
ABO/RH(D): O POS
Antibody Screen: NEGATIVE

## 2016-10-08 LAB — LIPASE, BLOOD: Lipase: 44 U/L (ref 11–51)

## 2016-10-08 MED ORDER — ACETAMINOPHEN 325 MG PO TABS
650.0000 mg | ORAL_TABLET | Freq: Once | ORAL | Status: AC
  Administered 2016-10-08: 650 mg via ORAL
  Filled 2016-10-08: qty 2

## 2016-10-08 MED ORDER — SODIUM CHLORIDE 0.9 % IV SOLN
INTRAVENOUS | Status: DC
Start: 2016-10-08 — End: 2016-10-08
  Administered 2016-10-08: 17:00:00 via INTRAVENOUS

## 2016-10-08 NOTE — ED Triage Notes (Signed)
Patient c/o lower abd pain that x1 week. Patient c/o nausea, vomiting, and fevers. Denies any diarrhea. Per patient dark tarry stools. Patient reports hx of GI bleed due to gastric ulcers.

## 2016-10-08 NOTE — ED Provider Notes (Signed)
[facility] DEPT Provider Note   CSN: [id number] Arrival date & time: 10/08/16  1345     History   Chief Complaint Chief Complaint  Patient presents with  . Abdominal Pain    HPI Anthony Galvan. is a 83 y.o. male.  HPI Pt presents to the ED with complaints of abdominal pain.  Pt states he started having pain in his upper abdomen over the last week.  The symptoms have been mild to moderate.  He has had some nausea and vomited once or twice but that has not been a constant issue.  Today he noticed that his stools were dark black in color.  This concerned him and he came to the ED.  He does have a history of GI bleed in the past. Past Medical History:  Diagnosis Date  . CAD S/P percutaneous coronary angioplasty   . Duodenal ulcer May 2011   on EGD  . GERD (gastroesophageal reflux disease)   . Helicobacter pylori gastritis MAY 2012 HP STOOL AG NEG  . HTN (hypertension)   . Hyperlipidemia   . MI (myocardial infarction) ([place]) 1976, 1980, [place] for at [facility] and [place] (Dr. Iona Beard)  . Osteoarthritis   . Presence of bare metal stent in LAD coronary artery - VerifFlex BMS 3.0 mm x 12 mm - post-dilated to 3.5 mm 08/01/2012   VeriFlex BMS 3.0 mm x 12 mm -- 3.5 mm  . S/P colonoscopy 2011   normal, internal hemorrhoids      Past Surgical History:  Procedure Laterality Date  . CARDIAC CATHETERIZATION  01/30/2007   D1 75 %, mid LAD 50%, 50-70% circumflex, 50-70% RCA.(Dr. Adora Fridge)  . CARDIAC CATHETERIZATION  April 2014   Mid LAD 99% apple core; D2 ostial 70-80% (2 small for PCI) small nondominant RCA 60-70%. Distal circumflex/L PDA ~50% --> PCI of LAD  . CARDIAC CATHETERIZATION  12/11/2001   normal L main, small RCA, dominant LL Cfx with mild diffuse disease, LAD with mid 10-20% stenosis, ramus intermedius with mild diffuse disease (Dr. Jackie Plum)  . CATARACT EXTRACTION    . COLONOSCOPY  OCT 2011 ARS   SML [place]   in  setting of MI  . CORONARY STENT PLACEMENT  08/01/2012   Mid LAD:D1 75 %, mid LAD 50%, 50-70% circumflex, 50-70% RCA; PCI of mid-LAD with VeriFlex BMS 3x22mm (Dr. Roni Bread)  . LEFT HEART CATHETERIZATION WITH CORONARY ANGIOGRAM N/A 08/01/2012   Procedure: LEFT HEART CATHETERIZATION WITH CORONARY ANGIOGRAM;  Surgeon: Leonie Man, MD;  Location: [facility] CATH LAB;  Service: Cardiovascular;  Laterality: N/A;  . NM MYOCAR PERF WALL MOTION  08/2003   adenosine stress - focal decreased perfusion defect in distal inferior wall, no significant ischemic changes  . PERCUTANEOUS STENT INTERVENTION  08/01/2012   Procedure: PERCUTANEOUS STENT INTERVENTION;  Surgeon: Leonie Man, MD;  Location: [facility] CATH LAB;  Service: Cardiovascular;;  bms to mid lad  . TRANSTHORACIC ECHOCARDIOGRAM  April 2014   normal LV size and function. EF 60-65%. Grade 1 diastolic soft. Mild aortic valve calcification  . UPPER GASTROINTESTINAL ENDOSCOPY  MAY 2011 MELENA, HEMATEMESIS   DUODENAL ULCER, Bx: H PYLORI POS       Home Medications    Prior to Admission medications   Medication Sig Start Date End Date Taking? Authorizing Provider  acetaminophen (TYLENOL) 325 MG tablet Take 975 mg by mouth 3 (three) times daily.    [provider]  atorvastatin (LIPITOR) 80 MG tablet Take 1 tablet (80 mg total) by mouth daily at 6 PM. 08/02/12   Brett Canales, PA-C  azithromycin (ZITHROMAX) 250 MG tablet 1 tab daily starting Sunday afternoon for 4 more days 08/07/15   Nat Christen, MD  cetirizine (ZYRTEC) 5 MG tablet Take 5 mg by mouth daily.    [provider]  clopidogrel (PLAVIX) 75 MG tablet TAKE ONE (1) TABLET BY MOUTH EVERY DAY    Leonie Man, MD  donepezil (ARICEPT) 10 MG tablet Take 5 mg by mouth daily.    [provider]  hydrALAZINE (APRESOLINE) 10 MG tablet Take 10 mg by mouth 2 (two) times daily.     [provider]  hydrochlorothiazide (HYDRODIURIL) 25 MG tablet Take 25 mg by mouth daily.     [provider]  isosorbide mononitrate (IMDUR) 30 MG 24 hr tablet Take 30 mg by mouth daily.    [provider]  metoprolol (LOPRESSOR) 50 MG tablet Take 50 mg by mouth 2 (two) times daily.    [provider]  Multiple Vitamin (MULTIVITAMIN WITH MINERALS) TABS tablet Take 1 tablet by mouth daily.    [provider]  NITROSTAT 0.4 MG SL tablet PLACE 1 TABLET UNDER TONGUE EVERY 5 MINUTES FOR 3 DOSES AS NEEDED. 11/17/13   Leonie Man, MD  valsartan (DIOVAN) 320 MG tablet Take 320 mg by mouth daily.    [provider]    Family History Family History  Problem Relation Age of Onset  . Heart disease Mother   . Kidney disease Father   . Heart disease Brother 3  . Heart disease Sister   . Colon cancer Neg Hx     Social History Social History  Substance Use Topics  . Smoking status: Never Smoker  . Smokeless tobacco: Never Used  . Alcohol use No     Allergies   Patient has no known allergies.   Review of Systems Review of Systems  Constitutional: Negative for fever.  Gastrointestinal: Positive for abdominal pain.  Neurological: Positive for light-headedness.  All other systems reviewed and are negative.    Physical Exam Updated Vital Signs BP (!) 141/46 (BP Location: Left Arm)   Pulse 78   Temp 98.2 F (36.8 C) (Oral)   Resp 18   Ht 1.626 m (5\' 4" )   Wt 77.1 kg (170 lb)   SpO2 94%   BMI 29.18 kg/m   Physical Exam  Constitutional: He appears well-developed and well-nourished. No distress.  HENT:  Head: Normocephalic and atraumatic.  Right Ear: External ear normal.  Left Ear: External ear normal.  Eyes: Conjunctivae are normal. Right eye exhibits no discharge. Left eye exhibits no discharge. No scleral icterus.  Neck: Neck supple. No tracheal deviation present.  Cardiovascular: Normal rate, regular rhythm and intact distal pulses.   Pulmonary/Chest: Effort normal and breath sounds normal. No stridor. No respiratory  distress. He has no wheezes. He has no rales.  Abdominal: Soft. Bowel sounds are normal. He exhibits no distension. There is no tenderness. There is no rebound and no guarding.  Genitourinary:  Genitourinary Comments: Dark black stool  Musculoskeletal: He exhibits no edema or tenderness.  Neurological: He is alert. He has normal strength. No cranial nerve deficit (no facial droop, extraocular movements intact, no slurred speech) or sensory deficit. He exhibits normal muscle tone. He displays no seizure activity. Coordination normal.  Skin: Skin is warm and dry. No rash noted.  Psychiatric: He has a normal mood and affect.  Nursing note and vitals reviewed.    ED Treatments / Results  Labs (all labs ordered are listed, but only abnormal results are displayed) Labs Reviewed  COMPREHENSIVE METABOLIC PANEL - Abnormal; Notable for the following:       Result Value   Chloride 100 (*)    Glucose, Bld 102 (*)    BUN 53 (*)    Creatinine, Ser 1.79 (*)    ALT 11 (*)    Total Bilirubin 2.2 (*)    GFR calc non Af Amer 31 (*)    GFR calc Af Amer 36 (*)    All other components within normal limits  CBC - Abnormal; Notable for the following:    WBC 14.7 (*)    All other components within normal limits  POC OCCULT BLOOD, ED - Abnormal; Notable for the following:    Fecal Occult Bld POSITIVE (*)    All other components within normal limits  LIPASE, BLOOD  PROTIME-INR  URINALYSIS, ROUTINE W REFLEX MICROSCOPIC  TYPE AND SCREEN    Radiology Dg Abdomen Acute W/chest  Result Date: 10/08/2016 CLINICAL DATA:  Abdominal pain EXAM: DG ABDOMEN ACUTE W/ 1V CHEST COMPARISON:  08/07/2015 FINDINGS: Cardiac shadow is at the upper limits of normal in size and stable. The lungs are well aerated bilaterally. Mild chronic interstitial changes are again seen. No free air is seen. Scattered large and small bowel gas is noted. No obstructive changes are seen. Degenerative changes of the thoracolumbar spine are  noted. IMPRESSION: No acute abnormality noted. Electronically Signed   By: Inez Catalina M.D.   On: 10/08/2016 18:09    Procedures Procedures (including critical care time)  Medications Ordered in ED Medications  0.9 %  sodium chloride infusion ( Intravenous New Bag/Given 10/08/16 1729)     Initial Impression / Assessment and Plan / ED Course  I have reviewed the triage vital signs and the nursing notes.  Pertinent labs & imaging results that were available during my care of the patient were reviewed by me and considered in my medical decision making (see chart for details).   patient presented to the emergency room with complaints of mild abdominal discomfort this past week. Today he noticed dark black stools.  The patient's abdominal exam is benign. He is no focal tenderness to palpation. His laboratory tests are notable for an elevated BUN and creatinine. His white blood cell count is also elevated and he has positive occult blood in his stool. His symptoms are suggestive of an upper GI bleed.  Hemoglobin is normal at this point. He does not require transfusion. However, I do think the patient warrants admission to hospital for serial hemoglobin testing and GI consultation.  Final Clinical Impressions(s) / ED Diagnoses   Final diagnoses:  Upper GI bleed      Dorie Rank, MD 10/08/16 0254

## 2016-10-08 NOTE — H&P (Addendum)
[facility] History and Physical  Anthony Galvan. QQP:[id number] DOB: [birth year] DOA: 10/08/2016  Referring physician: Dr Tomi Bamberger PCP: Iona Beard, MD   Chief Complaint: Dark stools, N/V and lower abd pain  HPI: Anthony Maday. is a 83 y.o. male with history of CAD (hx cor stent LAD 2014), HTN, HL and GIB in the past. Presented to ED today with upper abd pain off and on for several days, and onset of nausea/ poor appetite for 3-4 days and then today developed black tarry stools.  Came to ED, Hb 13 and stool black / guiac + per the ED MD.  Asked to see for admission.    Pt reports epigastric pain mostly x 3-5 days.  One year ago when he was at the [place] he says he was having "stomach pains" so they took him off his ASA and put him on Advil, which he has been taking for stomach pains prn at the rate of about 2 pills every other day for the past year.  Denies any hematemesis, no recent CP, SOB, no diarrhea.  No urinary c/o's.  No SOB. NO fevers or wt loss and no change in BM.  Grew up in [place], worked as a Psychologist, sport and exercise.  Was in [employer] combat action stationed in the [place] 1944-45 and spent time on [place].  No tobacco/ etoh.      Baseline creat 1.2- 1.4  Old chart  Aug 03 - unstable angina, hx mult heart cath (1976- 1996), had intervention in 1980 and 1996. HTN, HL.  Underwent cath > normal LVEF, no sig CAD , noncardiac CP.  [place] home.  May 05 - chest pain > had negative Persantine stress test and sent home Oct 08 - noncardiac chest pain, heart cath showed nonobstructive cAD May 11 - hematemesis and hematochezia, due to gastric ulcer/ H Pylori +.  Started on 10d course of HPylori Rx.  EGD with laser and epi Rx of duodenoal ulcer. F/U GI.  HTN. Apr 14 - Chest pain/ [place] > severe LAD disease 97% lesion treated with  BMS due to hx of GIB's.   rx asa and Plavix for one month or possibly one year  ROS  denies CP  no joint pain   no HA  no blurry vision  no dysuria  no difficulty  voiding  no change in urine color    Past Medical History  Past Medical History:  Diagnosis Date  . CAD S/P percutaneous coronary angioplasty   . Duodenal ulcer May 2011   on EGD  . GERD (gastroesophageal reflux disease)   . Helicobacter pylori gastritis MAY 2012 HP STOOL AG NEG  . HTN (hypertension)   . Hyperlipidemia   . MI (myocardial infarction) ([place]) 1976, 1980, [place] for at [facility] and [place] (Dr. Iona Beard)  . Osteoarthritis   . Presence of bare metal stent in LAD coronary artery - VerifFlex BMS 3.0 mm x 12 mm - post-dilated to 3.5 mm 08/01/2012   VeriFlex BMS 3.0 mm x 12 mm -- 3.5 mm  . S/P colonoscopy 2011   normal, internal hemorrhoids   Past Surgical History  Past Surgical History:  Procedure Laterality Date  . CARDIAC CATHETERIZATION  01/30/2007   D1 75 %, mid LAD 50%, 50-70% circumflex, 50-70% RCA.(Dr. Adora Fridge)  . CARDIAC CATHETERIZATION  April 2014   Mid LAD 99% apple core; D2 ostial 70-80% (2 small for PCI) small nondominant RCA 60-70%. Distal circumflex/L PDA ~50% -->  PCI of LAD  . CARDIAC CATHETERIZATION  12/11/2001   normal L main, small RCA, dominant LL Cfx with mild diffuse disease, LAD with mid 10-20% stenosis, ramus intermedius with mild diffuse disease (Dr. Jackie Plum)  . CATARACT EXTRACTION    . COLONOSCOPY  OCT 2011 ARS   SML [place]   in setting of MI  . CORONARY STENT PLACEMENT  08/01/2012   Mid LAD:D1 75 %, mid LAD 50%, 50-70% circumflex, 50-70% RCA; PCI of mid-LAD with VeriFlex BMS 3x77m (Dr. DRoni Bread  . LEFT HEART CATHETERIZATION WITH CORONARY ANGIOGRAM N/A 08/01/2012   Procedure: LEFT HEART CATHETERIZATION WITH CORONARY ANGIOGRAM;  Surgeon: DLeonie Man MD;  [facility];  Service: Cardiovascular;  Laterality: N/A;  . NM MYOCAR PERF WALL MOTION  08/2003   adenosine stress - focal decreased perfusion defect in distal inferior wall, no significant ischemic changes  . PERCUTANEOUS STENT  INTERVENTION  08/01/2012   Procedure: PERCUTANEOUS STENT INTERVENTION;  Surgeon: DLeonie Man MD;  [facility];  Service: Cardiovascular;;  bms to mid lad  . TRANSTHORACIC ECHOCARDIOGRAM  April 2014   normal LV size and function. EF 60-65%. Grade 1 diastolic soft. Mild aortic valve calcification  . UPPER GASTROINTESTINAL ENDOSCOPY  MAY 2011 MELENA, HEMATEMESIS   DUODENAL ULCER, Bx: H PYLORI POS   Family History  Family History  Problem Relation Age of Onset  . Heart disease Mother   . Kidney disease Father   . Heart disease Brother 544 . Heart disease Sister   . Colon cancer Neg Hx    Social History  reports that he has never smoked. He has never used smokeless tobacco. He reports that he does not drink alcohol or use drugs. Allergies No Known Allergies Home medications Prior to Admission medications   Medication Sig Start Date End Date Taking? Authorizing Provider  acetaminophen (TYLENOL) 325 MG tablet Take 975 mg by mouth 3 (three) times daily.    [provider]  atorvastatin (LIPITOR) 80 MG tablet Take 1 tablet (80 mg total) by mouth daily at 6 PM. 08/02/12   HBrett Canales PA-C  azithromycin (ZITHROMAX) 250 MG tablet 1 tab daily starting Sunday afternoon for 4 more days 08/07/15   CNat Christen MD  cetirizine (ZYRTEC) 5 MG tablet Take 5 mg by mouth daily.    [provider]  clopidogrel (PLAVIX) 75 MG tablet TAKE ONE (1) TABLET BY MOUTH EVERY DAY    HLeonie Man MD  donepezil (ARICEPT) 10 MG tablet Take 5 mg by mouth daily.    [provider]  hydrALAZINE (APRESOLINE) 10 MG tablet Take 10 mg by mouth 2 (two) times daily.     [provider]  hydrochlorothiazide (HYDRODIURIL) 25 MG tablet Take 25 mg by mouth daily.    [provider]  isosorbide mononitrate (IMDUR) 30 MG 24 hr tablet Take 30 mg by mouth daily.    [provider]  metoprolol (LOPRESSOR) 50 MG tablet Take 50 mg by mouth 2 (two) times daily.     [provider]  Multiple Vitamin (MULTIVITAMIN WITH MINERALS) TABS tablet Take 1 tablet by mouth daily.    [provider]  NITROSTAT 0.4 MG SL tablet PLACE 1 TABLET UNDER TONGUE EVERY 5 MINUTES FOR 3 DOSES AS NEEDED. 11/17/13   HLeonie Man MD  valsartan (DIOVAN) 320 MG tablet Take 320 mg by mouth daily.    [provider]  Liver Function Tests  Recent Labs Lab 10/08/16 1500  AST 21  ALT 11*  ALKPHOS 50  BILITOT 2.2*  PROT 8.1  ALBUMIN 4.3    Recent Labs Lab 10/08/16 1500  LIPASE 44   CBC  Recent Labs Lab 10/08/16 1500  WBC 14.7*  HGB 13.3  HCT 40.6  MCV 89.0  PLT 628   Basic Metabolic Panel  Recent Labs Lab 10/08/16 1500  NA 140  K 4.2  CL 100*  CO2 27  GLUCOSE 102*  BUN 53*  CREATININE 1.79*  CALCIUM 9.6     Vitals:   10/08/16 1830 10/08/16 1930 10/08/16 2010 10/08/16 2030  BP: 124/80 118/85 140/68 (!) 141/71  Pulse: 79 74 76 78  Resp:  _0 Temp:      TempSrc:      SpO2: 99% 98% 96% 94%  Weight:      Height:       Exam: Gen eldelry male, no distress, pleasant No rash, cyanosis or gangrene Sclera anicteric, throat clear  No jvd or bruits Chest clear bilat RRR no MRG Abd soft ntnd no mass or ascites +bs GU normal male in diaper MS no joint effusions or deformity Ext no LE or UE edema / no wounds or ulcers Neuro is alert, Ox 3 , nf    Na 140 K 4.2  Cr 1.79  BUN 53   eGFR 35  Ca 9.6  Alb 4.3  LFT's ok   WBC 14k   Hb 13  plt 184 UA negative INR 1.04   FOB +  Xray acute abd + 1V chest > no acute abnormality seen.  Home meds > lipitor, zyrtec, plavix, aricept, hydralazine, HCTZ, Imdur, lopressor, MVI, SL NTG, diovan   Assess: 1  Melena/ GI bleed - in pt with hx prior GIB / duodenal ulcers in the past.  Taking advil for last year prn prescribed at the [place]. Plan admit, hold plavix, NPO, get serial H/H, GI consult in am or sooner if gets worse. Start PPI IV.   2  Hx CAD - sp BMS to LAD in 2014 3  HTN  on multiple BP medications, BP stable here, continue except hold diovan/ HCTZ 4  Acute on CKD3 - hold ACEi, baseline creat 1.2- 1.4 5  Volume is a little dry, not eating much  Plan - as above      [place] D [facility] Pager [phone]   If 7PM-7AM, please contact night-coverage [email or web address] Password [facility] 10/08/2016, 9:24 PM

## 2016-10-09 ENCOUNTER — Encounter (HOSPITAL_COMMUNITY)
Admission: EM | Disposition: A | Discharge status: Home / Self Care | Payer: Self-pay | Source: Home / Self Care | Attending: Emergency Medicine

## 2016-10-09 ENCOUNTER — Encounter (HOSPITAL_COMMUNITY): Payer: Self-pay | Admitting: Gastroenterology

## 2016-10-09 DIAGNOSIS — K269 Duodenal ulcer, unspecified as acute or chronic, without hemorrhage or perforation: Secondary | ICD-10-CM | POA: Diagnosis not present

## 2016-10-09 DIAGNOSIS — K922 Gastrointestinal hemorrhage, unspecified: Secondary | ICD-10-CM

## 2016-10-09 DIAGNOSIS — K2970 Gastritis, unspecified, without bleeding: Secondary | ICD-10-CM

## 2016-10-09 DIAGNOSIS — D72829 Elevated white blood cell count, unspecified: Secondary | ICD-10-CM | POA: Diagnosis not present

## 2016-10-09 DIAGNOSIS — N183 Chronic kidney disease, stage 3 (moderate): Secondary | ICD-10-CM

## 2016-10-09 DIAGNOSIS — K3189 Other diseases of stomach and duodenum: Secondary | ICD-10-CM | POA: Diagnosis not present

## 2016-10-09 DIAGNOSIS — K921 Melena: Secondary | ICD-10-CM | POA: Diagnosis not present

## 2016-10-09 DIAGNOSIS — K279 Peptic ulcer, site unspecified, unspecified as acute or chronic, without hemorrhage or perforation: Secondary | ICD-10-CM

## 2016-10-09 DIAGNOSIS — K2980 Duodenitis without bleeding: Secondary | ICD-10-CM

## 2016-10-09 HISTORY — PX: ESOPHAGOGASTRODUODENOSCOPY: SHX5428

## 2016-10-09 LAB — BASIC METABOLIC PANEL
ANION GAP: 10 (ref 5–15)
BUN: 42 mg/dL — ABNORMAL HIGH (ref 6–20)
CHLORIDE: 100 mmol/L — AB (ref 101–111)
CO2: 25 mmol/L (ref 22–32)
Calcium: 8.8 mg/dL — ABNORMAL LOW (ref 8.9–10.3)
Creatinine, Ser: 1.32 mg/dL — ABNORMAL HIGH (ref 0.61–1.24)
GFR calc non Af Amer: 45 mL/min — ABNORMAL LOW (ref >60)
GFR, EST AFRICAN AMERICAN: 52 mL/min — AB (ref >60)
Glucose, Bld: 75 mg/dL (ref 65–99)
POTASSIUM: 4 mmol/L (ref 3.5–5.1)
SODIUM: 135 mmol/L (ref 135–145)

## 2016-10-09 LAB — HEMOGLOBIN AND HEMATOCRIT, BLOOD
HCT: 36.8 % — ABNORMAL LOW (ref 39.0–52.0)
HCT: 35.6 % — ABNORMAL LOW (ref 39.0–52.0)
Hemoglobin: 12.3 g/dL — ABNORMAL LOW (ref 13.0–17.0)
Hemoglobin: 11.7 g/dL — ABNORMAL LOW (ref 13.0–17.0)

## 2016-10-09 LAB — CBC
HEMATOCRIT: 37.8 % — AB (ref 39.0–52.0)
HEMOGLOBIN: 12.5 g/dL — AB (ref 13.0–17.0)
MCH: 29.4 pg (ref 26.0–34.0)
MCHC: 33.1 g/dL (ref 30.0–36.0)
MCV: 88.9 fL (ref 78.0–100.0)
PLATELETS: 155 10*3/uL (ref 150–400)
RBC: 4.25 MIL/uL (ref 4.22–5.81)
RDW: 11.5 % (ref 11.5–15.5)
WBC: 12.5 10*3/uL — AB (ref 4.0–10.5)

## 2016-10-09 SURGERY — EGD (ESOPHAGOGASTRODUODENOSCOPY)
Anesthesia: Moderate Sedation

## 2016-10-09 MED ORDER — METOPROLOL TARTRATE 50 MG PO TABS
50.0000 mg | ORAL_TABLET | Freq: Two times a day (BID) | ORAL | Status: DC
  Administered 2016-10-09: 50 mg via ORAL
  Filled 2016-10-09 (×2): qty 1

## 2016-10-09 MED ORDER — ACETAMINOPHEN 650 MG RE SUPP: 650.0000 mg | Freq: Four times a day (QID) | RECTAL | Status: DC | PRN

## 2016-10-09 MED ORDER — DONEPEZIL HCL 5 MG PO TABS
5.0000 mg | ORAL_TABLET | Freq: Every day | ORAL | Status: DC
  Administered 2016-10-09 – 2016-10-10 (×2): 5 mg via ORAL
  Filled 2016-10-09 (×2): qty 1

## 2016-10-09 MED ORDER — HYDRALAZINE HCL 10 MG PO TABS
10.0000 mg | ORAL_TABLET | Freq: Two times a day (BID) | ORAL | Status: DC
  Administered 2016-10-09 (×2): 10 mg via ORAL
  Filled 2016-10-09 (×3): qty 1

## 2016-10-09 MED ORDER — SODIUM CHLORIDE 0.9 % IV SOLN
INTRAVENOUS | Status: DC
  Administered 2016-10-09: 1000 mL via INTRAVENOUS

## 2016-10-09 MED ORDER — ISOSORBIDE MONONITRATE ER 60 MG PO TB24
30.0000 mg | ORAL_TABLET | Freq: Every day | ORAL | Status: DC
  Administered 2016-10-09: 30 mg via ORAL
  Filled 2016-10-09 (×2): qty 1

## 2016-10-09 MED ORDER — PANTOPRAZOLE SODIUM 40 MG PO TBEC
40.0000 mg | DELAYED_RELEASE_TABLET | Freq: Two times a day (BID) | ORAL | Status: DC
  Administered 2016-10-09 – 2016-10-10 (×2): 40 mg via ORAL
  Filled 2016-10-09 (×2): qty 1

## 2016-10-09 MED ORDER — MIDAZOLAM HCL 5 MG/5ML IJ SOLN
INTRAMUSCULAR | Status: DC | PRN
  Administered 2016-10-09: 1 mg via INTRAVENOUS
  Administered 2016-10-09: 2 mg via INTRAVENOUS

## 2016-10-09 MED ORDER — ADULT MULTIVITAMIN W/MINERALS CH
1.0000 | ORAL_TABLET | Freq: Every day | ORAL | Status: DC
  Administered 2016-10-09 – 2016-10-10 (×2): 1 via ORAL
  Filled 2016-10-09 (×2): qty 1

## 2016-10-09 MED ORDER — MIDAZOLAM HCL 5 MG/5ML IJ SOLN
INTRAMUSCULAR | Status: AC
  Filled 2016-10-09: qty 5

## 2016-10-09 MED ORDER — FENTANYL CITRATE (PF) 100 MCG/2ML IJ SOLN
INTRAMUSCULAR | Status: DC | PRN
  Administered 2016-10-09 (×2): 25 ug via INTRAVENOUS

## 2016-10-09 MED ORDER — SODIUM CHLORIDE 0.45 % IV SOLN
INTRAVENOUS | Status: DC
  Administered 2016-10-09 (×2): via INTRAVENOUS

## 2016-10-09 MED ORDER — ONDANSETRON HCL 4 MG PO TABS: 4.0000 mg | ORAL_TABLET | Freq: Four times a day (QID) | ORAL | Status: DC | PRN

## 2016-10-09 MED ORDER — HYDROCODONE-ACETAMINOPHEN 5-325 MG PO TABS
1.0000 | ORAL_TABLET | ORAL | Status: DC | PRN
  Filled 2016-10-09: qty 2

## 2016-10-09 MED ORDER — HYDROMORPHONE HCL 1 MG/ML IJ SOLN
0.5000 mg | INTRAMUSCULAR | Status: DC | PRN
Start: 2016-10-09 — End: 2016-10-10
  Administered 2016-10-09: 0.5 mg via INTRAVENOUS
  Filled 2016-10-09: qty 0.5

## 2016-10-09 MED ORDER — FENTANYL CITRATE (PF) 100 MCG/2ML IJ SOLN
INTRAMUSCULAR | Status: AC
  Filled 2016-10-09: qty 2

## 2016-10-09 MED ORDER — PANTOPRAZOLE SODIUM 40 MG IV SOLR
40.0000 mg | Freq: Two times a day (BID) | INTRAVENOUS | Status: DC
  Administered 2016-10-09 (×2): 40 mg via INTRAVENOUS
  Filled 2016-10-09: qty 40

## 2016-10-09 MED ORDER — ATORVASTATIN CALCIUM 40 MG PO TABS
80.0000 mg | ORAL_TABLET | Freq: Every day | ORAL | Status: DC
  Administered 2016-10-09: 80 mg via ORAL
  Filled 2016-10-09: qty 2

## 2016-10-09 MED ORDER — ONDANSETRON HCL 4 MG/2ML IJ SOLN: 4.0000 mg | Freq: Four times a day (QID) | INTRAMUSCULAR | Status: DC | PRN

## 2016-10-09 MED ORDER — ACETAMINOPHEN 325 MG PO TABS
650.0000 mg | ORAL_TABLET | Freq: Four times a day (QID) | ORAL | Status: DC | PRN
  Filled 2016-10-09: qty 2

## 2016-10-09 MED ORDER — HYDROMORPHONE HCL 1 MG/ML IJ SOLN: 1.0000 mg | INTRAMUSCULAR | Status: DC | PRN

## 2016-10-09 MED ORDER — POLYETHYLENE GLYCOL 3350 17 G PO PACK: 17.0000 g | PACK | Freq: Every day | ORAL | Status: DC | PRN

## 2016-10-09 MED ORDER — LIDOCAINE VISCOUS 2 % MT SOLN
OROMUCOSAL | Status: AC
  Filled 2016-10-09: qty 15

## 2016-10-09 MED ORDER — LIDOCAINE VISCOUS 2 % MT SOLN
OROMUCOSAL | Status: DC | PRN
Start: 2016-10-09 — End: 2016-10-09
  Administered 2016-10-09: 1 via OROMUCOSAL

## 2016-10-09 NOTE — Consult Note (Signed)
Referring Provider: Dr. Charlies Silvers  Primary Care Physician:  Iona Beard, MD Primary Gastroenterologist:  Dr. Oneida Alar   Date of Admission: 10/08/16 Date of Consultation: 10/09/16  Reason for Consultation:  Upper GI bleed   HPI:  Anthony Galvan. is a 83 y.o. year old male with a history of duodenal ulcer in 2011, found to have H.pylori, and treated at that time. He completed a stool antigen in 2012 with documented eradication. Presented to the ED with melena, Hgb 13.3 on admission. This morning 12.5. Last evidence of melena this morning.   Per ED notes, he had been seen at the [place] a year ago and was taken off aspirin and put on Advil to take 2-3 times per day as needed. States he would take 1-3 a day. Poor historian, as now he states it was tylenol he was taking. States he saw black stool starting around last Thursday. States he had upper abdominal pain, feel nauseated, and feel like he needed to burp. No hematochezia. No dysphagia. States his tongue had a really heavy white coat on it and would clean it off with his toothbrush every morning around the same time the black stool was seen. Feels tired. No chest pain. Notes dyspnea on exertion. No constipation/diarrhea.   Past Medical History:  Diagnosis Date  . CAD S/P percutaneous coronary angioplasty   . Duodenal ulcer May 2011   on EGD  . GERD (gastroesophageal reflux disease)   . Helicobacter pylori gastritis MAY 2012 HP STOOL AG NEG  . HTN (hypertension)   . Hyperlipidemia   . MI (myocardial infarction) ([place]) 1976, 1980, [place] for at [facility] [place] (Dr. Iona Beard)  . Osteoarthritis   . Presence of bare metal stent in LAD coronary artery - VerifFlex BMS 3.0 mm x 12 mm - post-dilated to 3.5 mm 08/01/2012   VeriFlex BMS 3.0 mm x 12 mm -- 3.5 mm  . S/P colonoscopy 2011   normal, internal hemorrhoids    Past Surgical History:  Procedure Laterality Date  . CARDIAC CATHETERIZATION  01/30/2007   D1 75 %, mid LAD 50%,  50-70% circumflex, 50-70% RCA.(Dr. Adora Fridge)  . CARDIAC CATHETERIZATION  April 2014   Mid LAD 99% apple core; D2 ostial 70-80% (2 small for PCI) small nondominant RCA 60-70%. Distal circumflex/L PDA ~50% --> PCI of LAD  . CARDIAC CATHETERIZATION  12/11/2001   normal L main, small RCA, dominant LL Cfx with mild diffuse disease, LAD with mid 10-20% stenosis, ramus intermedius with mild diffuse disease (Dr. Jackie Plum)  . CATARACT EXTRACTION    . COLONOSCOPY  OCT 2011 ARS   [facility]   in setting of MI  . CORONARY STENT PLACEMENT  08/01/2012   Mid LAD:D1 75 %, mid LAD 50%, 50-70% circumflex, 50-70% RCA; PCI of mid-LAD with VeriFlex BMS 3x46mm (Dr. Roni Bread)  . LEFT HEART CATHETERIZATION WITH CORONARY ANGIOGRAM N/A 08/01/2012   Procedure: LEFT HEART CATHETERIZATION WITH CORONARY ANGIOGRAM;  Surgeon: Leonie Man, MD;  Location: [facility] CATH LAB;  Service: Cardiovascular;  Laterality: N/A;  . NM MYOCAR PERF WALL MOTION  08/2003   adenosine stress - focal decreased perfusion defect in distal inferior wall, no significant ischemic changes  . PERCUTANEOUS STENT INTERVENTION  08/01/2012   Procedure: PERCUTANEOUS STENT INTERVENTION;  Surgeon: Leonie Man, MD;  Location: [facility] CATH LAB;  Service: Cardiovascular;;  bms to mid lad  . TRANSTHORACIC ECHOCARDIOGRAM  April  2014   normal LV size and function. EF 60-65%. Grade 1 diastolic soft. Mild aortic valve calcification  . UPPER GASTROINTESTINAL ENDOSCOPY  MAY 2011 MELENA, HEMATEMESIS   DUODENAL ULCER, Bx: H PYLORI POS    Prior to Admission medications   Medication Sig Start Date End Date Taking? Authorizing Provider  acetaminophen (TYLENOL) 325 MG tablet Take 975 mg by mouth 3 (three) times daily.   Yes [provider]  atorvastatin (LIPITOR) 80 MG tablet Take 1 tablet (80 mg total) by mouth daily at 6 PM. 08/02/12  Yes Brett Canales, PA-C  cetirizine (ZYRTEC) 5 MG tablet Take 5 mg by mouth daily.   Yes [provider]  clopidogrel (PLAVIX) 75 MG tablet TAKE ONE (1) TABLET BY MOUTH EVERY DAY   Yes Leonie Man, MD  donepezil (ARICEPT) 10 MG tablet Take 5 mg by mouth daily.   Yes [provider]  hydrALAZINE (APRESOLINE) 10 MG tablet Take 10 mg by mouth 2 (two) times daily.    Yes [provider]  hydrochlorothiazide (HYDRODIURIL) 25 MG tablet Take 25 mg by mouth daily.   Yes [provider]  isosorbide mononitrate (IMDUR) 30 MG 24 hr tablet Take 30 mg by mouth daily.   Yes [provider]  metoprolol (LOPRESSOR) 50 MG tablet Take 50 mg by mouth 2 (two) times daily.   Yes [provider]  Multiple Vitamin (MULTIVITAMIN WITH MINERALS) TABS tablet Take 1 tablet by mouth daily.   Yes [provider]  NITROSTAT 0.4 MG SL tablet PLACE 1 TABLET UNDER TONGUE EVERY 5 MINUTES FOR 3 DOSES AS NEEDED. 11/17/13  Yes Leonie Man, MD  valsartan (DIOVAN) 320 MG tablet Take 320 mg by mouth daily.   Yes [provider]    Current Facility-Administered Medications  Medication Dose Route Frequency Provider Last Rate Last Dose  . 0.45 % sodium chloride infusion   Intravenous Continuous Roney Jaffe, MD 65 mL/hr at 10/09/16 0145    . acetaminophen (TYLENOL) tablet 650 mg  650 mg Oral Q6H PRN Roney Jaffe, MD       Or  . acetaminophen (TYLENOL) suppository 650 mg  650 mg Rectal Q6H PRN Roney Jaffe, MD      . atorvastatin (LIPITOR) tablet 80 mg  80 mg Oral q1800 Roney Jaffe, MD      . donepezil (ARICEPT) tablet 5 mg  5 mg Oral Daily Roney Jaffe, MD   5 mg at 10/09/16 2229  . hydrALAZINE (APRESOLINE) tablet 10 mg  10 mg Oral BID Roney Jaffe, MD   10 mg at 10/09/16 0911  . HYDROcodone-acetaminophen (NORCO/VICODIN) 5-325 MG per tablet 1-2 tablet  1-2 tablet Oral Q4H PRN Roney Jaffe, MD      . HYDROmorphone (DILAUDID) injection 0.5 mg  0.5 mg Intravenous Q4H PRN Robbie Lis, MD   0.5 mg at 10/09/16 0910  . isosorbide  mononitrate (IMDUR) 24 hr tablet 30 mg  30 mg Oral Daily Roney Jaffe, MD   30 mg at 10/09/16 0911  . metoprolol tartrate (LOPRESSOR) tablet 50 mg  50 mg Oral BID Roney Jaffe, MD   50 mg at 10/09/16 0911  . multivitamin with minerals tablet 1 tablet  1 tablet Oral Daily Roney Jaffe, MD   1 tablet at 10/09/16 0910  . ondansetron (ZOFRAN) tablet 4 mg  4 mg Oral Q6H PRN Roney Jaffe, MD       Or  . ondansetron [facility]) injection 4 mg  4 mg Intravenous  Q6H PRN Roney Jaffe, MD      . pantoprazole (PROTONIX) injection 40 mg  40 mg Intravenous Q12H Roney Jaffe, MD   40 mg at 10/09/16 0910  . polyethylene glycol (MIRALAX / GLYCOLAX) packet 17 g  17 g Oral Daily PRN Roney Jaffe, MD        Allergies as of 10/08/2016  . (No Known Allergies)    Family History  Problem Relation Age of Onset  . Heart disease Mother   . Kidney disease Father   . Heart disease Brother 85  . Heart disease Sister   . Colon cancer Neg Hx     Social History   Social History  . Marital status: Widowed    Spouse name: N/A  . Number of children: 3  . Years of education: N/A   Occupational History  . Not on file.   Social History Main Topics  . Smoking status: Never Smoker  . Smokeless tobacco: Never Used  . Alcohol use No  . Drug use: No  . Sexual activity: Not on file   Other Topics Concern  . Not on file   Social History Narrative   Is a father of 3 with 3 grandchildren 3 great-grandchildren. He lives with one of his 2 grandsons.   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 He was a war 2 veteran in the [place]. 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   He never smoked. He does not drink alcohol.   He is very active, and a majority. He walks 30+ minutes every day. He also likes to play golf.    Review of Systems: Negative unless mentioned in HPI    Physical Exam: Vital signs in last 24 hours: Temp:  [98 F (36.7 C)-98.4 F (36.9 C)] 98 F (36.7 C) (06/25 0526) Pulse Rate:  [73-79] 73 (06/25 0526) Resp:  [17-18] 18 (06/25 0526) BP: (118-141)/(40-85) 123/40 (06/25 0526) SpO2:  [94 %-99 %] 97 % (06/25 0526) Weight:  [170 lb (77.1 kg)] 170 lb (77.1 kg) (06/24 2351) Last BM Date: 10/09/16 General:   Alert,  Well-developed, well-nourished, pleasant and cooperative in NAD Head:  Normocephalic and atraumatic. Eyes:  Sclera clear, no icterus.   Conjunctiva pink. Ears:  Normal auditory acuity. Nose:  No deformity, discharge,  or lesions. Mouth:  Likely oral thrush on tongue Lungs:  Clear throughout to auscultation.  Heart:  Regular rate and rhythm; no murmurs, clicks, rubs,  or gallops. Abdomen:  Soft, nontender and nondistended. No masses, hepatosplenomegaly or hernias noted. Normal bowel sounds, without guarding, and without rebound.   Rectal:  Deferred  Msk:  Symmetrical without gross deformities. Normal posture. Extremities:  Without edema. Neurologic:  Alert and  oriented x4 Skin:  Intact without significant lesions or rashes. Psych:  Alert and cooperative. Normal mood and affect.  Intake/Output from previous day: 06/24 0701 - 06/25 0700 In: 211.3 [I.V.:211.3] Out: 500 [Urine:500] Intake/Output this shift: No intake/output data recorded.  Lab Results:  Recent Labs  10/08/16 1500 10/09/16 0213 10/09/16 0653  WBC 14.7*  --  12.5*  HGB 13.3 12.3* 12.5*  HCT 40.6 36.8* 37.8*  PLT 184  --  155   BMET  Recent Labs  10/08/16 1500 10/09/16 0653  NA 140 135  K 4.2 4.0  CL 100* 100*  CO2 27 25  GLUCOSE 102*  75  BUN 53* 42*  CREATININE 1.79* 1.32*  CALCIUM 9.6 8.8*   LFT  Recent Labs  10/08/16 1500  PROT 8.1  ALBUMIN 4.3  AST 21  ALT 11*  ALKPHOS 50  BILITOT 2.2*   PT/INR  Recent Labs  10/08/16 1500  LABPROT 13.6  INR 1.04    Studies/Results: Dg Abdomen Acute W/chest  Result Date:  10/08/2016 CLINICAL DATA:  Abdominal pain EXAM: DG ABDOMEN ACUTE W/ 1V CHEST COMPARISON:  08/07/2015 FINDINGS: Cardiac shadow is at the upper limits of normal in size and stable. The lungs are well aerated bilaterally. Mild chronic interstitial changes are again seen. No free air is seen. Scattered large and small bowel gas is noted. No obstructive changes are seen. Degenerative changes of the thoracolumbar spine are noted. IMPRESSION: No acute abnormality noted. Electronically Signed   By: Inez Catalina M.D.   On: 10/08/2016 18:09    Impression: 83 year old male admitted with melena, only slight drift from admitting Hgb, with last evidence of melena this morning. History significant for duodenal ulcer in 2011, H.pylori s/p treatment and document of eradication in 2012. There is some concern of daily NSAIDs upon review of chart, but patient is unclear about this and believes it was a tylenol product. He is on Plavix as outpatient, otherwise no other concerning agents. He is currently NPO. Will arrange for EGD with Dr. Oneida Alar this afternoon. Discussed risks and benefits with patient, who stated understanding. As of note, appears he may have oral thrush as well but is asymptomatic without odynophagia or dysphagia.   Plan: Remain NPO EGD with Dr. Oneida Alar today: risks and benefits discussed in detail with stated understanding Continue IV PPI for now  Annitta Needs, PhD, ANP-BC [facility]      LOS: 1 day    10/09/2016, 10:46 AM

## 2016-10-09 NOTE — Op Note (Signed)
[facility] Patient Name: Anthony Galvan Procedure Date: 10/09/2016 3:51 PM MRN: [id number] Date of Birth: 06-15-1921 Attending MD: Barney Drain , MD CSN: [id number] Age: 83 Admit Type: Inpatient Procedure:                Upper GI endoscopy WITH COLD FORCEPS BIOPSY Indications:              Melena ON IBUPROFEN AND NO PPI, PMHx: PUD/UGIB IN                            2011 Providers:                Barney Drain, MD, Hinton Rao, RN, Lurline Del, RN Referring MD:             Barrie Folk. Hill MD, MD Medicines:                Fentanyl 50 micrograms IV, Midazolam 3 mg IV Complications:            No immediate complications. Estimated Blood Loss:     Estimated blood loss was minimal. Procedure:                Pre-Anesthesia Assessment:                           - Prior to the procedure, a History and Physical                            was performed, and patient medications and                            allergies were reviewed. The patient's tolerance of                            previous anesthesia was also reviewed. The risks                            and benefits of the procedure and the sedation                            options and risks were discussed with the patient.                            All questions were answered, and informed consent                            was obtained. Prior Anticoagulants: The patient has                            taken ibuprofen, last dose was 1 day prior to                            procedure. ASA Grade Assessment: II - A patient                            with mild systemic disease. After reviewing the  risks and benefits, the patient was deemed in                            satisfactory condition to undergo the procedure.                            After obtaining informed consent, the endoscope was                            passed under direct vision. Throughout the                            procedure, the  patient's blood pressure, pulse, and                            oxygen saturations were monitored continuously. The                            [id number] ([id number]) scope was introduced through the                            mouth, and advanced to the second part of duodenum.                            The upper GI endoscopy was accomplished without                            difficulty. The patient tolerated the procedure                            fairly well. Scope In: 4:09:14 PM Scope Out: 4:14:39 PM Total Procedure Duration: 0 hours 5 minutes 25 seconds  Findings:      The examined esophagus was normal.      Segmental moderate inflammation characterized by congestion (edema),       erosions, erythema and linear erosions was found in the entire examined       stomach. Biopsies were taken with a cold forceps for Helicobacter pylori       testing.      Five non-bleeding cratered(2) and linear duodenal ulcers(3). LARGEST ONE       WITH a flat pigmented spot (Forrest Class IIc) were found in the       duodenal bulb. The largest lesion was 8 mm in largest dimension.      Diffuse severe inflammation characterized by congestion (edema),       erythema and granularity was found in the duodenal bulb.      The second portion of the duodenum was normal. Impression:               - MELENA DUE TO DUODENAL ULCERS                           - MODERATE EROSIVE Gastritis/DUODENITIS                           - Multiple non-bleeding duodenal ulcers with a flat  pigmented spot (Forrest Class IIc). Moderate Sedation:      Moderate (conscious) sedation was administered by the endoscopy nurse       and supervised by the endoscopist. The following parameters were       monitored: oxygen saturation, heart rate, blood pressure, and response       to care. Total physician intraservice time was 15 minutes. Recommendation:           - Await pathology results.                           -  Low fat diet.                           - Continue present medications. AVOID IBUPROFEN AND                            NAPROXEN/NSAIDS INDEFINITELY DUE TO UGIB/PUD AND                            CHRONIC RENAL INSUFFICENCY.                           - Use Protonix (pantoprazole) 40 mg PO BID for 3                            months THEN ONCE DAILY.                           - Return to my office in 3 months.                           - Return patient to hospital ward for ongoing care. Procedure Code(s):        --- Professional ---                           [phone], Esophagogastroduodenoscopy, flexible,                            transoral; with biopsy, single or multiple                           99152, Moderate sedation services provided by the                            same physician or other qualified health care                            professional performing the diagnostic or                            therapeutic service that the sedation supports,                            requiring the presence of an independent trained  observer to assist in the monitoring of the                            patient's level of consciousness and physiological                            status; initial 15 minutes of intraservice time,                            patient age 69 years or older Diagnosis Code(s):        --- Professional ---                           K29.70, Gastritis, unspecified, without bleeding                           K26.9, Duodenal ulcer, unspecified as acute or                            chronic, without hemorrhage or perforation                           K29.80, Duodenitis without bleeding                           K92.1, Melena (includes Hematochezia) CPT copyright 2016 American Medical Association. All rights reserved. The codes documented in this report are preliminary and upon coder review may  be revised to meet current compliance  requirements. Barney Drain, MD Barney Drain, MD 10/09/2016 4:26:39 PM This report has been signed electronically. Number of Addenda: 0

## 2016-10-09 NOTE — Interval H&P Note (Signed)
History and Physical Interval Note:  10/09/2016 3:46 PM  Anthony Galvan.  has presented today for surgery, with the diagnosis of melena  The various methods of treatment have been discussed with the patient and family. After consideration of risks, benefits and other options for treatment, the patient has consented to  Procedure(s): ESOPHAGOGASTRODUODENOSCOPY (EGD) (N/A) as a surgical intervention .  The patient's history has been reviewed, patient examined, no change in status, stable for surgery.  I have reviewed the patient's chart and labs.  Questions were answered to the patient's satisfaction.     [employer]

## 2016-10-09 NOTE — H&P (View-Only) (Signed)
Referring Provider: Dr. Charlies Silvers  Primary Care Physician:  Iona Beard, MD Primary Gastroenterologist:  Dr. Oneida Alar   Date of Admission: 10/08/16 Date of Consultation: 10/09/16  Reason for Consultation:  Upper GI bleed   HPI:  Anthony Galvan. is a 83 y.o. year old male with a history of duodenal ulcer in 2011, found to have H.pylori, and treated at that time. He completed a stool antigen in 2012 with documented eradication. Presented to the ED with melena, Hgb 13.3 on admission. This morning 12.5. Last evidence of melena this morning.   Per ED notes, he had been seen at the [place] a year ago and was taken off aspirin and put on Advil to take 2-3 times per day as needed. States he would take 1-3 a day. Poor historian, as now he states it was tylenol he was taking. States he saw black stool starting around last Thursday. States he had upper abdominal pain, feel nauseated, and feel like he needed to burp. No hematochezia. No dysphagia. States his tongue had a really heavy white coat on it and would clean it off with his toothbrush every morning around the same time the black stool was seen. Feels tired. No chest pain. Notes dyspnea on exertion. No constipation/diarrhea.   Past Medical History:  Diagnosis Date  . CAD S/P percutaneous coronary angioplasty   . Duodenal ulcer May 2011   on EGD  . GERD (gastroesophageal reflux disease)   . Helicobacter pylori gastritis MAY 2012 HP STOOL AG NEG  . HTN (hypertension)   . Hyperlipidemia   . MI (myocardial infarction) ([place]) 1976, 1980, [place] for at [facility] (Dr. Iona Beard)  . Osteoarthritis   . Presence of bare metal stent in LAD coronary artery - VerifFlex BMS 3.0 mm x 12 mm - post-dilated to 3.5 mm 08/01/2012   VeriFlex BMS 3.0 mm x 12 mm -- 3.5 mm  . S/P colonoscopy 2011   normal, internal hemorrhoids    Past Surgical History:  Procedure Laterality Date  . CARDIAC CATHETERIZATION  01/30/2007   D1 75 %, mid LAD 50%,  50-70% circumflex, 50-70% RCA.(Dr. Adora Fridge)  . CARDIAC CATHETERIZATION  April 2014   Mid LAD 99% apple core; D2 ostial 70-80% (2 small for PCI) small nondominant RCA 60-70%. Distal circumflex/L PDA ~50% --> PCI of LAD  . CARDIAC CATHETERIZATION  12/11/2001   normal L main, small RCA, dominant LL Cfx with mild diffuse disease, LAD with mid 10-20% stenosis, ramus intermedius with mild diffuse disease (Dr. Jackie Plum)  . CATARACT EXTRACTION    . COLONOSCOPY  OCT 2011 ARS   [facility] [place]   in setting of MI  . CORONARY STENT PLACEMENT  08/01/2012   Mid LAD:D1 75 %, mid LAD 50%, 50-70% circumflex, 50-70% RCA; PCI of mid-LAD with VeriFlex BMS 3x93mm (Dr. Roni Bread)  . LEFT HEART CATHETERIZATION WITH CORONARY ANGIOGRAM N/A 08/01/2012   Procedure: LEFT HEART CATHETERIZATION WITH CORONARY ANGIOGRAM;  Surgeon: Leonie Man, MD;  Location: [facility] CATH LAB;  Service: Cardiovascular;  Laterality: N/A;  . NM MYOCAR PERF WALL MOTION  08/2003   adenosine stress - focal decreased perfusion defect in distal inferior wall, no significant ischemic changes  . PERCUTANEOUS STENT INTERVENTION  08/01/2012   Procedure: PERCUTANEOUS STENT INTERVENTION;  Surgeon: Leonie Man, MD;  Location: [facility] CATH LAB;  Service: Cardiovascular;;  bms to mid lad  . TRANSTHORACIC ECHOCARDIOGRAM  April  2014   normal LV size and function. EF 60-65%. Grade 1 diastolic soft. Mild aortic valve calcification  . UPPER GASTROINTESTINAL ENDOSCOPY  MAY 2011 MELENA, HEMATEMESIS   DUODENAL ULCER, Bx: H PYLORI POS    Prior to Admission medications   Medication Sig Start Date End Date Taking? Authorizing Provider  acetaminophen (TYLENOL) 325 MG tablet Take 975 mg by mouth 3 (three) times daily.   Yes [provider]  atorvastatin (LIPITOR) 80 MG tablet Take 1 tablet (80 mg total) by mouth daily at 6 PM. 08/02/12  Yes Brett Canales, PA-C  cetirizine (ZYRTEC) 5 MG tablet Take 5 mg by mouth daily.   Yes [provider]  clopidogrel (PLAVIX) 75 MG tablet TAKE ONE (1) TABLET BY MOUTH EVERY DAY   Yes Leonie Man, MD  donepezil (ARICEPT) 10 MG tablet Take 5 mg by mouth daily.   Yes [provider]  hydrALAZINE (APRESOLINE) 10 MG tablet Take 10 mg by mouth 2 (two) times daily.    Yes [provider]  hydrochlorothiazide (HYDRODIURIL) 25 MG tablet Take 25 mg by mouth daily.   Yes [provider]  isosorbide mononitrate (IMDUR) 30 MG 24 hr tablet Take 30 mg by mouth daily.   Yes [provider]  metoprolol (LOPRESSOR) 50 MG tablet Take 50 mg by mouth 2 (two) times daily.   Yes [provider]  Multiple Vitamin (MULTIVITAMIN WITH MINERALS) TABS tablet Take 1 tablet by mouth daily.   Yes [provider]  NITROSTAT 0.4 MG SL tablet PLACE 1 TABLET UNDER TONGUE EVERY 5 MINUTES FOR 3 DOSES AS NEEDED. 11/17/13  Yes Leonie Man, MD  valsartan (DIOVAN) 320 MG tablet Take 320 mg by mouth daily.   Yes [provider]    Current Facility-Administered Medications  Medication Dose Route Frequency Provider Last Rate Last Dose  . 0.45 % sodium chloride infusion   Intravenous Continuous Roney Jaffe, MD 65 mL/hr at 10/09/16 0145    . acetaminophen (TYLENOL) tablet 650 mg  650 mg Oral Q6H PRN Roney Jaffe, MD       Or  . acetaminophen (TYLENOL) suppository 650 mg  650 mg Rectal Q6H PRN Roney Jaffe, MD      . atorvastatin (LIPITOR) tablet 80 mg  80 mg Oral q1800 Roney Jaffe, MD      . donepezil (ARICEPT) tablet 5 mg  5 mg Oral Daily Roney Jaffe, MD   5 mg at 10/09/16 0254  . hydrALAZINE (APRESOLINE) tablet 10 mg  10 mg Oral BID Roney Jaffe, MD   10 mg at 10/09/16 0911  . HYDROcodone-acetaminophen (NORCO/VICODIN) 5-325 MG per tablet 1-2 tablet  1-2 tablet Oral Q4H PRN Roney Jaffe, MD      . HYDROmorphone (DILAUDID) injection 0.5 mg  0.5 mg Intravenous Q4H PRN Robbie Lis, MD   0.5 mg at 10/09/16 0910  . isosorbide  mononitrate (IMDUR) 24 hr tablet 30 mg  30 mg Oral Daily Roney Jaffe, MD   30 mg at 10/09/16 0911  . metoprolol tartrate (LOPRESSOR) tablet 50 mg  50 mg Oral BID Roney Jaffe, MD   50 mg at 10/09/16 0911  . multivitamin with minerals tablet 1 tablet  1 tablet Oral Daily Roney Jaffe, MD   1 tablet at 10/09/16 0910  . ondansetron (ZOFRAN) tablet 4 mg  4 mg Oral Q6H PRN Roney Jaffe, MD       Or  . ondansetron [facility]) injection 4 mg  4 mg Intravenous  Q6H PRN Roney Jaffe, MD      . pantoprazole (PROTONIX) injection 40 mg  40 mg Intravenous Q12H Roney Jaffe, MD   40 mg at 10/09/16 0910  . polyethylene glycol (MIRALAX / GLYCOLAX) packet 17 g  17 g Oral Daily PRN Roney Jaffe, MD        Allergies as of 10/08/2016  . (No Known Allergies)    Family History  Problem Relation Age of Onset  . Heart disease Mother   . Kidney disease Father   . Heart disease Brother 60  . Heart disease Sister   . Colon cancer Neg Hx     Social History   Social History  . Marital status: Widowed    Spouse name: N/A  . Number of children: 3  . Years of education: N/A   Occupational History  . Not on file.   Social History Main Topics  . Smoking status: Never Smoker  . Smokeless tobacco: Never Used  . Alcohol use No  . Drug use: No  . Sexual activity: Not on file   Other Topics Concern  . Not on file   Social History Narrative   Is a father of 3 with 3 grandchildren 3 great-grandchildren. He lives with one of his 2 grandsons.   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 He was a war 2 veteran in the [place]. 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   He never smoked. He does not drink alcohol.   He is very active, and a majority. He walks 30+ minutes every day. He also likes to play golf.    Review of Systems: Negative unless mentioned in HPI    Physical Exam: Vital signs in last 24 hours: Temp:  [98 F (36.7 C)-98.4 F (36.9 C)] 98 F (36.7 C) (06/25 0526) Pulse Rate:  [73-79] 73 (06/25 0526) Resp:  [17-18] 18 (06/25 0526) BP: (118-141)/(40-85) 123/40 (06/25 0526) SpO2:  [94 %-99 %] 97 % (06/25 0526) Weight:  [170 lb (77.1 kg)] 170 lb (77.1 kg) (06/24 2351) Last BM Date: 10/09/16 General:   Alert,  Well-developed, well-nourished, pleasant and cooperative in NAD Head:  Normocephalic and atraumatic. Eyes:  Sclera clear, no icterus.   Conjunctiva pink. Ears:  Normal auditory acuity. Nose:  No deformity, discharge,  or lesions. Mouth:  Likely oral thrush on tongue Lungs:  Clear throughout to auscultation.  Heart:  Regular rate and rhythm; no murmurs, clicks, rubs,  or gallops. Abdomen:  Soft, nontender and nondistended. No masses, hepatosplenomegaly or hernias noted. Normal bowel sounds, without guarding, and without rebound.   Rectal:  Deferred  Msk:  Symmetrical without gross deformities. Normal posture. Extremities:  Without edema. Neurologic:  Alert and  oriented x4 Skin:  Intact without significant lesions or rashes. Psych:  Alert and cooperative. Normal mood and affect.  Intake/Output from previous day: 06/24 0701 - 06/25 0700 In: 211.3 [I.V.:211.3] Out: 500 [Urine:500] Intake/Output this shift: No intake/output data recorded.  Lab Results:  Recent Labs  10/08/16 1500 10/09/16 0213 10/09/16 0653  WBC 14.7*  --  12.5*  HGB 13.3 12.3* 12.5*  HCT 40.6 36.8* 37.8*  PLT 184  --  155   BMET  Recent Labs  10/08/16 1500 10/09/16 0653  NA 140 135  K 4.2 4.0  CL 100* 100*  CO2 27 25  GLUCOSE 102*  75  BUN 53* 42*  CREATININE 1.79* 1.32*  CALCIUM 9.6 8.8*   LFT  Recent Labs  10/08/16 1500  PROT 8.1  ALBUMIN 4.3  AST 21  ALT 11*  ALKPHOS 50  BILITOT 2.2*   PT/INR  Recent Labs  10/08/16 1500  LABPROT 13.6  INR 1.04    Studies/Results: Dg Abdomen Acute W/chest  Result Date:  10/08/2016 CLINICAL DATA:  Abdominal pain EXAM: DG ABDOMEN ACUTE W/ 1V CHEST COMPARISON:  08/07/2015 FINDINGS: Cardiac shadow is at the upper limits of normal in size and stable. The lungs are well aerated bilaterally. Mild chronic interstitial changes are again seen. No free air is seen. Scattered large and small bowel gas is noted. No obstructive changes are seen. Degenerative changes of the thoracolumbar spine are noted. IMPRESSION: No acute abnormality noted. Electronically Signed   By: Inez Catalina M.D.   On: 10/08/2016 18:09    Impression: 83 year old male admitted with melena, only slight drift from admitting Hgb, with last evidence of melena this morning. History significant for duodenal ulcer in 2011, H.pylori s/p treatment and document of eradication in 2012. There is some concern of daily NSAIDs upon review of chart, but patient is unclear about this and believes it was a tylenol product. He is on Plavix as outpatient, otherwise no other concerning agents. He is currently NPO. Will arrange for EGD with Dr. Oneida Alar this afternoon. Discussed risks and benefits with patient, who stated understanding. As of note, appears he may have oral thrush as well but is asymptomatic without odynophagia or dysphagia.   Plan: Remain NPO EGD with Dr. Oneida Alar today: risks and benefits discussed in detail with stated understanding Continue IV PPI for now  Annitta Needs, PhD, ANP-BC [facility]      LOS: 1 day    10/09/2016, 10:46 AM

## 2016-10-09 NOTE — Progress Notes (Addendum)
Patient ID: Anthony Galvan., male   DOB: Jan 26, 1922, 83 y.o.   MRN: [id number]  PROGRESS NOTE    Anthony Galvan.  VOZ:[id number] DOB: Oct 21, 1921 DOA: 10/08/2016  PCP: Iona Beard, MD   Brief Narrative:  83 year old male with past medical history of coronary artery disease, stent to LAD in 2014, hypertension, dyslipidemia, GI bleeding past. He presented to [facility] with reports of upper abdominal pain, intermittent for past several days prior to this admission. He was taking Advil for pain relief. He also had nausea and poor appetite for 3-4 days and then has developed black tarry stools on the day of the admission. Patient was hemodynamically stable in ED. Patient had guaiac positive stool.   Assessment & Plan:   Principal Problem: Acute upper GI bleed with melena  - Likely related to use of Advil for pain relief - Hemoglobin remains stable - Continue Protonix 40 mg IV every 12 hours - Consulted to GI - Abdominal x-ray showed no acute GI findings - We will advance diet to clear liquids and continue to monitor CBC daily; patient may not need any endoscopic evaluation if his hemoglobin remains stable and his stool color normalizes  Active Problems:   Leukocytosis - Likely reactive - White blood cell count 12.5 this morning - Urinalysis clear, chest x-ray clear    CKD (chronic kidney disease) stage 3, GFR 30-59 ml/min - Creatinine is better this morning, 1.32. Creatinine was 1.79 on the admission, likely has improved with IV fluids    Anemia of chronic kidney disease - Patient also has a GI bleed but hemoglobin remains stable    Essential hypertension - Continue isosorbide, hydralazine and metoprolol    Dyslipidemia - Continue Lipitor      DVT prophylaxis: SCD's  Code Status: full code  Family Communication: no family at the bedside this am Disposition Plan: home once cleared by GI   Consultants:   GI  Procedures:   None   Antimicrobials:    None    Subjective: No overnight events.   Objective: Vitals:   10/08/16 2210 10/08/16 2330 10/08/16 2351 10/09/16 0526  BP: 135/68 129/62 (!) 129/45 (!) 123/40  Pulse: 76 74 73 73  Resp: 17 17 18 18   Temp:   98.4 F (36.9 C) 98 F (36.7 C)  TempSrc:   Oral Oral  SpO2: 99% 95% 98% 97%  Weight:   77.1 kg (170 lb)   Height:   5\' 4"  (1.626 m)     Intake/Output Summary (Last 24 hours) at 10/09/16 0943 Last data filed at 10/09/16 0527  Gross per 24 hour  Intake           211.25 ml  Output              500 ml  Net          -288.75 ml   Filed Weights   10/08/16 1358 10/08/16 2351  Weight: 77.1 kg (170 lb) 77.1 kg (170 lb)    Examination:  General exam: Appears calm and comfortable  Respiratory system: Clear to auscultation. Respiratory effort normal. Cardiovascular system: S1 & S2 heard, RRR.  No pedal edema. Gastrointestinal system: Abdomen is nondistended, soft and nontender. No organomegaly or masses felt. Normal bowel sounds heard. Central nervous system: Alert and oriented. No focal neurological deficits. Extremities: Symmetric 5 x 5 power. Skin: No rashes, lesions or ulcers Psychiatry: Judgement and insight appear normal. Mood & affect appropriate.   Data Reviewed: I  have personally reviewed following labs and imaging studies  CBC:  Recent Labs Lab 10/08/16 1500 10/09/16 0213 10/09/16 0653  WBC 14.7*  --  12.5*  HGB 13.3 12.3* 12.5*  HCT 40.6 36.8* 37.8*  MCV 89.0  --  88.9  PLT 184  --  570   Basic Metabolic Panel:  Recent Labs Lab 10/08/16 1500 10/09/16 0653  NA 140 135  K 4.2 4.0  CL 100* 100*  CO2 27 25  GLUCOSE 102* 75  BUN 53* 42*  CREATININE 1.79* 1.32*  CALCIUM 9.6 8.8*   GFR: Estimated Creatinine Clearance: 33.5 mL/min (A) (by C-G formula based on SCr of 1.32 mg/dL (H)). Liver Function Tests:  Recent Labs Lab 10/08/16 1500  AST 21  ALT 11*  ALKPHOS 50  BILITOT 2.2*  PROT 8.1  ALBUMIN 4.3    Recent Labs Lab  10/08/16 1500  LIPASE 44   No results for input(s): AMMONIA in the last 168 hours. Coagulation Profile:  Recent Labs Lab 10/08/16 1500  INR 1.04   Cardiac Enzymes: No results for input(s): CKTOTAL, CKMB, CKMBINDEX, TROPONINI in the last 168 hours. BNP (last 3 results) No results for input(s): PROBNP in the last 8760 hours. HbA1C: No results for input(s): HGBA1C in the last 72 hours. CBG: No results for input(s): GLUCAP in the last 168 hours. Lipid Profile: No results for input(s): CHOL, HDL, LDLCALC, TRIG, CHOLHDL, LDLDIRECT in the last 72 hours. Thyroid Function Tests: No results for input(s): TSH, T4TOTAL, FREET4, T3FREE, THYROIDAB in the last 72 hours. Anemia Panel: No results for input(s): VITAMINB12, FOLATE, FERRITIN, TIBC, IRON, RETICCTPCT in the last 72 hours. Urine analysis:    Component Value Date/Time   COLORURINE YELLOW 10/08/2016 2000   APPEARANCEUR CLEAR 10/08/2016 2000   LABSPEC 1.017 10/08/2016 2000   PHURINE 5.0 10/08/2016 2000   GLUCOSEU NEGATIVE 10/08/2016 2000   HGBUR NEGATIVE 10/08/2016 2000   BILIRUBINUR NEGATIVE 10/08/2016 2000   KETONESUR 5 (A) 10/08/2016 2000   PROTEINUR NEGATIVE 10/08/2016 2000   UROBILINOGEN 1.0 08/27/2009 0830   NITRITE NEGATIVE 10/08/2016 2000   LEUKOCYTESUR NEGATIVE 10/08/2016 2000   Sepsis Labs: @LABRCNTIP (procalcitonin:4,lacticidven:4)   )No results found for this or any previous visit (from the past 240 hour(s)).    Radiology Studies: Dg Abdomen Acute W/chest Result Date: 10/08/2016 No acute abnormality noted. Electronically Signed   By: Inez Catalina M.D.   On: 10/08/2016 18:09     Scheduled Meds: . atorvastatin  80 mg Oral q1800  . donepezil  5 mg Oral Daily  . hydrALAZINE  10 mg Oral BID  . isosorbide mononitrate  30 mg Oral Daily  . metoprolol tartrate  50 mg Oral BID  . multivitamin with minerals  1 tablet Oral Daily  . pantoprazole (PROTONIX) IV  40 mg Intravenous Q12H   Continuous Infusions: .  sodium chloride 65 mL/hr at 10/09/16 0145     LOS: 1 day    Time spent: 25 minutes  Greater than 50% of the time spent on counseling and coordinating the care.   Leisa Lenz, MD [facility] Pager [phone]  If 7PM-7AM, please contact night-coverage [email or web address] Password [facility] 10/09/2016, 9:43 AM

## 2016-10-10 ENCOUNTER — Telehealth: Payer: Self-pay | Admitting: Gastroenterology

## 2016-10-10 ENCOUNTER — Encounter: Payer: Self-pay | Admitting: Gastroenterology

## 2016-10-10 DIAGNOSIS — N183 Chronic kidney disease, stage 3 (moderate): Secondary | ICD-10-CM | POA: Diagnosis not present

## 2016-10-10 DIAGNOSIS — K264 Chronic or unspecified duodenal ulcer with hemorrhage: Secondary | ICD-10-CM

## 2016-10-10 DIAGNOSIS — K921 Melena: Secondary | ICD-10-CM | POA: Diagnosis not present

## 2016-10-10 DIAGNOSIS — K922 Gastrointestinal hemorrhage, unspecified: Secondary | ICD-10-CM | POA: Diagnosis not present

## 2016-10-10 LAB — CBC
HCT: 35.4 % — ABNORMAL LOW (ref 39.0–52.0)
HEMOGLOBIN: 11.6 g/dL — AB (ref 13.0–17.0)
MCH: 29.1 pg (ref 26.0–34.0)
MCHC: 32.8 g/dL (ref 30.0–36.0)
MCV: 88.9 fL (ref 78.0–100.0)
Platelets: 165 10*3/uL (ref 150–400)
RBC: 3.98 MIL/uL — AB (ref 4.22–5.81)
RDW: 11.4 % — ABNORMAL LOW (ref 11.5–15.5)
WBC: 12.6 10*3/uL — ABNORMAL HIGH (ref 4.0–10.5)

## 2016-10-10 LAB — BASIC METABOLIC PANEL
Anion gap: 7 (ref 5–15)
BUN: 38 mg/dL — ABNORMAL HIGH (ref 6–20)
CHLORIDE: 101 mmol/L (ref 101–111)
CO2: 24 mmol/L (ref 22–32)
Calcium: 8.3 mg/dL — ABNORMAL LOW (ref 8.9–10.3)
Creatinine, Ser: 1.33 mg/dL — ABNORMAL HIGH (ref 0.61–1.24)
GFR calc Af Amer: 52 mL/min — ABNORMAL LOW (ref >60)
GFR calc non Af Amer: 45 mL/min — ABNORMAL LOW (ref >60)
Glucose, Bld: 85 mg/dL (ref 65–99)
Potassium: 4.1 mmol/L (ref 3.5–5.1)
SODIUM: 132 mmol/L — AB (ref 135–145)

## 2016-10-10 MED ORDER — PANTOPRAZOLE SODIUM 40 MG PO TBEC: 40.0000 mg | DELAYED_RELEASE_TABLET | Freq: Two times a day (BID) | ORAL | 3 refills | Status: DC

## 2016-10-10 NOTE — Discharge Summary (Signed)
Physician Discharge Summary  Anthony Galvan. CXK:[id number] DOB: [birth year] DOA: 10/08/2016  PCP: Iona Beard, MD  Admit date: 10/08/2016 Discharge date: 10/10/2016  Recommendations for Outpatient Follow-up:  Hold plavix for 1-2 week, check with PCP when is it safe to continue but if not bleed 1 week holding it should be adequate.  Continue protonix 40 mg BID for 3 months and then once a day from that point on. Follow up with GI per scheduled appt.   Discharge Diagnoses:  Principal Problem:   GI bleed Active Problems:   Essential hypertension   MYOCARDIAL INFARCTION, HX OF   History of coronary artery stent placement   Acute on chronic renal failure (HCC)   CKD (chronic kidney disease) stage 3, GFR 30-59 ml/min   Melena   PUD (peptic ulcer disease)    Discharge Condition: stable   Diet recommendation: as tolerated   History of present illness:  83 year old male with past medical history of coronary artery disease, stent to LAD in 2014, hypertension, dyslipidemia, GI bleeding past. He presented to [facility] with reports of upper abdominal pain, intermittent for past several days prior to this admission. He was taking Advil for pain relief. He also had nausea and poor appetite for 3-4 days and then has developed black tarry stools on the day of the admission. Patient was hemodynamically stable in ED. Patient had guaiac positive stool.  Hospital Course:  Principal Problem: Acute upper GI bleed with melena / Anemia of chronic disease  - Likely related to use of Advil for pain relief - Per GI, hold plavix as noted above - Continue PPI therapy as noted above  - Abd x ray did not show acute GI findings  - Diet as tolerated   Active Problems:   Leukocytosis - Likely reactive - No evidence of infection     CKD (chronic kidney disease) stage 3, GFR 30-59 ml/min - Cr improved with IV fluids     Essential hypertension - Continue isosorbide, hydralazine and  metoprolol    Dyslipidemia - Continue Lipitor      DVT prophylaxis: SCD's  Code Status: full code  Family Communication: no family at the bedside this am    Consultants:   GI  Procedures:   None   Antimicrobials:   None   Signed:  Leisa Lenz, MD  [facility] 10/10/2016, 12:28 PM  Pager #: [phone]  Time spent in minutes: more than 30 minutes    Discharge Exam: Vitals:   10/10/16 0529 10/10/16 0946  BP: (!) 116/50 (!) 119/47  Pulse: 76 85  Resp: 20   Temp: 98.1 F (36.7 C)    Vitals:   10/09/16 2050 10/09/16 2158 10/10/16 0529 10/10/16 0946  BP:  (!) 101/48 (!) 116/50 (!) 119/47  Pulse:   76 85  Resp:  20 20   Temp:  98.4 F (36.9 C) 98.1 F (36.7 C)   TempSrc:   Oral   SpO2: 96% 95% 98%   Weight:      Height:        General: Pt is alert, follows commands appropriately, not in acute distress Cardiovascular: Regular rate and rhythm, S1/S2 +, no murmurs Respiratory: Clear to auscultation bilaterally, no wheezing, no crackles, no rhonchi Abdominal: Soft, non tender, non distended, bowel sounds +, no guarding Extremities: no edema, no cyanosis, pulses palpable bilaterally DP and PT Neuro: Grossly nonfocal  Discharge Instructions  Discharge Instructions    Call MD for:  persistant nausea and vomiting  Complete by:  As directed    Call MD for:  redness, tenderness, or signs of infection (pain, swelling, redness, odor or green/yellow discharge around incision site)    Complete by:  As directed    Call MD for:  severe uncontrolled pain    Complete by:  As directed    Diet - low sodium heart healthy    Complete by:  As directed    Discharge instructions    Complete by:  As directed    Hold plavix for 1-2 week, check with PCP when is it safe to continue but if not bleed 1 week holding it should be adequate.  Continue protonix 40 mg BID for 3 months and then once a day from that point on. Follow up with GI per scheduled appt.    Increase activity slowly    Complete by:  As directed      Allergies as of 10/10/2016   No Known Allergies     Medication List    STOP taking these medications   clopidogrel 75 MG tablet Commonly known as:  PLAVIX   hydrochlorothiazide 25 MG tablet Commonly known as:  HYDRODIURIL   valsartan 320 MG tablet Commonly known as:  DIOVAN     TAKE these medications   acetaminophen 325 MG tablet Commonly known as:  TYLENOL Take 975 mg by mouth 3 (three) times daily.   atorvastatin 80 MG tablet Commonly known as:  LIPITOR Take 1 tablet (80 mg total) by mouth daily at 6 PM.   cetirizine 5 MG tablet Commonly known as:  ZYRTEC Take 5 mg by mouth daily.   donepezil 10 MG tablet Commonly known as:  ARICEPT Take 5 mg by mouth daily.   hydrALAZINE 10 MG tablet Commonly known as:  APRESOLINE Take 10 mg by mouth 2 (two) times daily.   isosorbide mononitrate 30 MG 24 hr tablet Commonly known as:  IMDUR Take 30 mg by mouth daily.   metoprolol tartrate 50 MG tablet Commonly known as:  LOPRESSOR Take 50 mg by mouth 2 (two) times daily.   multivitamin with minerals Tabs tablet Take 1 tablet by mouth daily.   NITROSTAT 0.4 MG SL tablet Generic drug:  nitroGLYCERIN PLACE 1 TABLET UNDER TONGUE EVERY 5 MINUTES FOR 3 DOSES AS NEEDED.   pantoprazole 40 MG tablet Commonly known as:  PROTONIX Take 1 tablet (40 mg total) by mouth 2 (two) times daily before a meal.      Follow-up Information    Iona Beard, MD. Schedule an appointment as soon as possible for a visit in 1 week(s).   Specialty:  Family Medicine Contact information: [address] [place] [address] [phone]            The results of significant diagnostics from this hospitalization (including imaging, microbiology, ancillary and laboratory) are listed below for reference.    Significant Diagnostic Studies: Dg Abdomen Acute W/chest  Result Date: 10/08/2016 CLINICAL DATA:  Abdominal pain  EXAM: DG ABDOMEN ACUTE W/ 1V CHEST COMPARISON:  08/07/2015 FINDINGS: Cardiac shadow is at the upper limits of normal in size and stable. The lungs are well aerated bilaterally. Mild chronic interstitial changes are again seen. No free air is seen. Scattered large and small bowel gas is noted. No obstructive changes are seen. Degenerative changes of the thoracolumbar spine are noted. IMPRESSION: No acute abnormality noted. Electronically Signed   By: Inez Catalina M.D.   On: 10/08/2016 18:09    Microbiology: No  results found for this or any previous visit (from the past 240 hour(s)).   Labs: Basic Metabolic Panel:  Recent Labs Lab 10/08/16 1500 10/09/16 0653 10/10/16 0435  NA 140 135 132*  K 4.2 4.0 4.1  CL 100* 100* 101  CO2 27 25 24   GLUCOSE 102* 75 85  BUN 53* 42* 38*  CREATININE 1.79* 1.32* 1.33*  CALCIUM 9.6 8.8* 8.3*   Liver Function Tests:  Recent Labs Lab 10/08/16 1500  AST 21  ALT 11*  ALKPHOS 50  BILITOT 2.2*  PROT 8.1  ALBUMIN 4.3    Recent Labs Lab 10/08/16 1500  LIPASE 44   No results for input(s): AMMONIA in the last 168 hours. CBC:  Recent Labs Lab 10/08/16 1500 10/09/16 0213 10/09/16 0653 10/09/16 1356 10/10/16 0435  WBC 14.7*  --  12.5*  --  12.6*  HGB 13.3 12.3* 12.5* 11.7* 11.6*  HCT 40.6 36.8* 37.8* 35.6* 35.4*  MCV 89.0  --  88.9  --  88.9  PLT 184  --  155  --  165   Cardiac Enzymes: No results for input(s): CKTOTAL, CKMB, CKMBINDEX, TROPONINI in the last 168 hours. BNP: BNP (last 3 results) No results for input(s): BNP in the last 8760 hours.  ProBNP (last 3 results) No results for input(s): PROBNP in the last 8760 hours.  CBG: No results for input(s): GLUCAP in the last 168 hours.

## 2016-10-10 NOTE — Progress Notes (Deleted)
POC discussed with the patients daughter.  She voiced concerns in relation to her thinking that the patient was having problems with her ears.  She requested that the MD be notified.  Dr. Luan Pulling was notified of the situation and he stated that he would go in and see the patient.

## 2016-10-10 NOTE — Telephone Encounter (Signed)
Please arrange hospital follow-up in 3 months for GI bleed.

## 2016-10-10 NOTE — Care Management CC44 (Signed)
Condition Code 44 Documentation Completed  Patient Details  Name: Anthony Galvan. MRN: [id number] Date of Birth: 08/14/1921   Condition Code 44 given:  Yes Patient signature on Condition Code 44 notice:  Yes Documentation of 2 MD's agreement:  Yes Code 44 added to claim:  Yes    Sherald Barge, RN 10/10/2016, 11:22 AM

## 2016-10-10 NOTE — Progress Notes (Signed)
Discharge instructions reviewed with patient, verbalized understanding via teachback. Given AVS and protonix prescription. Pt verbalized understanding of when to follow-up, seek medical attention and home medications as instructed. IV site d/c'd, site within normal limits. Gauze dressing applied. Pt stated "I usually go to the [facility] and get all my medicines mailed to me from them for free. But, sometimes use [facility] if I have too." Attempted to notify [facility] pharmacy of new prescription as patient requested, pharmacy was already closed. [facility] and they stated protonix prescription would cost about $18. Notified patient. States he will have it filled there and have refills completed thru the [facility]. Pt in stable condition awaiting his daughter's arrival for discharge home. Donavan Foil, RN

## 2016-10-10 NOTE — Progress Notes (Signed)
   10/10/16 0946  Vitals  BP (!) 119/47  BP Location Right Arm  BP Method Automatic  Patient Position (if appropriate) Lying  Pulse Rate 85  Pulse Rate Source Dinamap  Notified MD of the Patients vitals and asked if the cardiac medications should be given.  New orders were given to D/c the medications.  This is a late entry for 0945 today.

## 2016-10-10 NOTE — Telephone Encounter (Signed)
APPT MADE AND LETTER SENT  °

## 2016-10-10 NOTE — Progress Notes (Signed)
REVIEWED-NO ADDITIONAL RECOMMENDATIONS..   Subjective: No abdominal pain, no N/V. Feels good. No melena.   Objective: Vital signs in last 24 hours: Temp:  [98 F (36.7 C)-98.4 F (36.9 C)] 98.1 F (36.7 C) (06/26 0529) Pulse Rate:  [59-77] 76 (06/26 0529) Resp:  [16-24] 20 (06/26 0529) BP: (101-147)/(48-71) 116/50 (06/26 0529) SpO2:  [95 %-100 %] 98 % (06/26 0529) Last BM Date: 10/09/16 General:   Alert and oriented, pleasant Abdomen:  Bowel sounds present, soft, non-tender, non-distended.  Extremities:  Without edema. Neurologic:  Alert and  oriented x4 Psych:  Alert and cooperative. Normal mood and affect.  Intake/Output from previous day: 06/25 0701 - 06/26 0700 In: -  Out: 1350 [Urine:1350] Intake/Output this shift: No intake/output data recorded.  Lab Results:  Recent Labs  10/08/16 1500  10/09/16 0653 10/09/16 1356 10/10/16 0435  WBC 14.7*  --  12.5*  --  12.6*  HGB 13.3  < > 12.5* 11.7* 11.6*  HCT 40.6  < > 37.8* 35.6* 35.4*  PLT 184  --  155  --  165  < > = values in this interval not displayed. BMET  Recent Labs  10/08/16 1500 10/09/16 0653 10/10/16 0435  NA 140 135 132*  K 4.2 4.0 4.1  CL 100* 100* 101  CO2 27 25 24   GLUCOSE 102* 75 85  BUN 53* 42* 38*  CREATININE 1.79* 1.32* 1.33*  CALCIUM 9.6 8.8* 8.3*   LFT  Recent Labs  10/08/16 1500  PROT 8.1  ALBUMIN 4.3  AST 21  ALT 11*  ALKPHOS 50  BILITOT 2.2*   PT/INR  Recent Labs  10/08/16 1500  LABPROT 13.6  INR 1.04     Studies/Results: Dg Abdomen Acute W/chest  Result Date: 10/08/2016 CLINICAL DATA:  Abdominal pain EXAM: DG ABDOMEN ACUTE W/ 1V CHEST COMPARISON:  08/07/2015 FINDINGS: Cardiac shadow is at the upper limits of normal in size and stable. The lungs are well aerated bilaterally. Mild chronic interstitial changes are again seen. No free air is seen. Scattered large and small bowel gas is noted. No obstructive changes are seen. Degenerative changes of the thoracolumbar  spine are noted. IMPRESSION: No acute abnormality noted. Electronically Signed   By: Inez Catalina M.D.   On: 10/08/2016 18:09    Assessment: 83 year old male admitted with melena, with EGD noting duodenal ulcers, moderate erosive gastritis/duodenitis. Needs to avoid all NSAIDs indefinitely due to history of PUD. Clinically improved and appropriate for discharge from a GI perspective.      Plan: Protonix BID for 3 months then once daily indefinitely Avoid all NSAIDs, aspirin powders Return to [facility] office in 3 months Follow-up on pathology as comes available  Annitta Needs, PhD, ANP-BC [facility]    LOS: 2 days    10/10/2016, 8:08 AM

## 2016-10-10 NOTE — Care Management Note (Signed)
Case Management Note  Patient Details  Name: Anthony Galvan. MRN: [id number] Date of Birth: 1921-08-06  Subjective/Objective:                  Pt admitted with GIB. He is from home, lives with his grandson and is ind with ALD's. He uses a cane with ambulation but has a walker and 'scooter' if needed. He has no HH. He has PCP, transportation to his appointments and insurance with drug coverage. Pt communicates no needs.   Action/Plan: DC home today with self care. No CM needs.   Expected Discharge Date:  10/10/16               Expected Discharge Plan:  Home/Self Care  In-House Referral:  NA  Discharge planning Services  CM Consult  Post Acute Care Choice:  NA Choice offered to:  NA  Status of Service:  Completed, signed off   Sherald Barge, RN 10/10/2016, 12:31 PM

## 2016-10-15 HISTORY — PX: TRANSTHORACIC ECHOCARDIOGRAM: SHX275

## 2016-10-16 ENCOUNTER — Encounter (HOSPITAL_COMMUNITY): Payer: Self-pay | Admitting: Gastroenterology

## 2016-10-20 ENCOUNTER — Inpatient Hospital Stay (HOSPITAL_COMMUNITY)
Admission: EM | Admit: 2016-10-20 | Discharge: 2016-10-24 | DRG: 287 | Disposition: A | Discharge status: Home / Self Care | Payer: Medicare Other | Attending: Cardiovascular Disease | Admitting: Cardiovascular Disease

## 2016-10-20 ENCOUNTER — Emergency Department (HOSPITAL_COMMUNITY): Payer: Medicare Other

## 2016-10-20 ENCOUNTER — Encounter (HOSPITAL_COMMUNITY): Payer: Self-pay

## 2016-10-20 DIAGNOSIS — R079 Chest pain, unspecified: Secondary | ICD-10-CM | POA: Diagnosis not present

## 2016-10-20 DIAGNOSIS — N183 Chronic kidney disease, stage 3 (moderate): Secondary | ICD-10-CM | POA: Diagnosis not present

## 2016-10-20 DIAGNOSIS — Z841 Family history of disorders of kidney and ureter: Secondary | ICD-10-CM

## 2016-10-20 DIAGNOSIS — I252 Old myocardial infarction: Secondary | ICD-10-CM

## 2016-10-20 DIAGNOSIS — I2510 Atherosclerotic heart disease of native coronary artery without angina pectoris: Secondary | ICD-10-CM | POA: Diagnosis not present

## 2016-10-20 DIAGNOSIS — Z9861 Coronary angioplasty status: Secondary | ICD-10-CM | POA: Diagnosis not present

## 2016-10-20 DIAGNOSIS — R7989 Other specified abnormal findings of blood chemistry: Secondary | ICD-10-CM | POA: Diagnosis not present

## 2016-10-20 DIAGNOSIS — Z9849 Cataract extraction status, unspecified eye: Secondary | ICD-10-CM | POA: Diagnosis not present

## 2016-10-20 DIAGNOSIS — R05 Cough: Secondary | ICD-10-CM

## 2016-10-20 DIAGNOSIS — M1990 Unspecified osteoarthritis, unspecified site: Secondary | ICD-10-CM | POA: Diagnosis present

## 2016-10-20 DIAGNOSIS — K264 Chronic or unspecified duodenal ulcer with hemorrhage: Secondary | ICD-10-CM | POA: Diagnosis present

## 2016-10-20 DIAGNOSIS — E785 Hyperlipidemia, unspecified: Secondary | ICD-10-CM | POA: Diagnosis not present

## 2016-10-20 DIAGNOSIS — I129 Hypertensive chronic kidney disease with stage 1 through stage 4 chronic kidney disease, or unspecified chronic kidney disease: Secondary | ICD-10-CM | POA: Diagnosis present

## 2016-10-20 DIAGNOSIS — Z8249 Family history of ischemic heart disease and other diseases of the circulatory system: Secondary | ICD-10-CM

## 2016-10-20 DIAGNOSIS — K219 Gastro-esophageal reflux disease without esophagitis: Secondary | ICD-10-CM | POA: Diagnosis not present

## 2016-10-20 DIAGNOSIS — J9811 Atelectasis: Secondary | ICD-10-CM | POA: Diagnosis not present

## 2016-10-20 DIAGNOSIS — Z955 Presence of coronary angioplasty implant and graft: Secondary | ICD-10-CM | POA: Diagnosis not present

## 2016-10-20 DIAGNOSIS — I25110 Atherosclerotic heart disease of native coronary artery with unstable angina pectoris: Secondary | ICD-10-CM | POA: Diagnosis not present

## 2016-10-20 DIAGNOSIS — K267 Chronic duodenal ulcer without hemorrhage or perforation: Secondary | ICD-10-CM | POA: Diagnosis present

## 2016-10-20 DIAGNOSIS — I10 Essential (primary) hypertension: Secondary | ICD-10-CM | POA: Diagnosis present

## 2016-10-20 DIAGNOSIS — I083 Combined rheumatic disorders of mitral, aortic and tricuspid valves: Secondary | ICD-10-CM | POA: Diagnosis present

## 2016-10-20 DIAGNOSIS — R059 Cough, unspecified: Secondary | ICD-10-CM

## 2016-10-20 DIAGNOSIS — I360 Nonrheumatic tricuspid (valve) stenosis: Secondary | ICD-10-CM | POA: Diagnosis not present

## 2016-10-20 DIAGNOSIS — K922 Gastrointestinal hemorrhage, unspecified: Secondary | ICD-10-CM | POA: Diagnosis present

## 2016-10-20 DIAGNOSIS — R778 Other specified abnormalities of plasma proteins: Secondary | ICD-10-CM

## 2016-10-20 DIAGNOSIS — R748 Abnormal levels of other serum enzymes: Secondary | ICD-10-CM | POA: Diagnosis not present

## 2016-10-20 DIAGNOSIS — I200 Unstable angina: Secondary | ICD-10-CM | POA: Diagnosis not present

## 2016-10-20 LAB — CBC
HEMATOCRIT: 37.9 % — AB (ref 39.0–52.0)
Hemoglobin: 12.1 g/dL — ABNORMAL LOW (ref 13.0–17.0)
MCH: 29.4 pg (ref 26.0–34.0)
MCHC: 31.9 g/dL (ref 30.0–36.0)
MCV: 92 fL (ref 78.0–100.0)
PLATELETS: 142 10*3/uL — AB (ref 150–400)
RBC: 4.12 MIL/uL — ABNORMAL LOW (ref 4.22–5.81)
RDW: 12.2 % (ref 11.5–15.5)
WBC: 16.4 10*3/uL — AB (ref 4.0–10.5)

## 2016-10-20 LAB — TROPONIN I
TROPONIN I: 0.07 ng/mL — AB (ref <0.03)
Troponin I: 0.06 ng/mL (ref <0.03)
Troponin I: 0.08 ng/mL (ref <0.03)
Troponin I: 0.07 ng/mL (ref <0.03)

## 2016-10-20 LAB — BASIC METABOLIC PANEL
Anion gap: 8 (ref 5–15)
BUN: 14 mg/dL (ref 6–20)
CHLORIDE: 102 mmol/L (ref 101–111)
CO2: 27 mmol/L (ref 22–32)
Calcium: 8.9 mg/dL (ref 8.9–10.3)
Creatinine, Ser: 1.45 mg/dL — ABNORMAL HIGH (ref 0.61–1.24)
GFR calc Af Amer: 47 mL/min — ABNORMAL LOW (ref >60)
GFR calc non Af Amer: 40 mL/min — ABNORMAL LOW (ref >60)
GLUCOSE: 140 mg/dL — AB (ref 65–99)
POTASSIUM: 4.5 mmol/L (ref 3.5–5.1)
SODIUM: 137 mmol/L (ref 135–145)

## 2016-10-20 LAB — HEPARIN LEVEL (UNFRACTIONATED): HEPARIN UNFRACTIONATED: 0.37 [IU]/mL (ref 0.30–0.70)

## 2016-10-20 LAB — MRSA PCR SCREENING: MRSA by PCR: NEGATIVE

## 2016-10-20 MED ORDER — NITROGLYCERIN 0.4 MG SL SUBL
0.4000 mg | SUBLINGUAL_TABLET | SUBLINGUAL | Status: DC | PRN
  Filled 2016-10-20: qty 1

## 2016-10-20 MED ORDER — HYDRALAZINE HCL 10 MG PO TABS: 10.0000 mg | ORAL_TABLET | Freq: Two times a day (BID) | ORAL | Status: DC

## 2016-10-20 MED ORDER — HEPARIN (PORCINE) IN NACL 100-0.45 UNIT/ML-% IJ SOLN
700.0000 [IU]/h | INTRAMUSCULAR | Status: DC
  Administered 2016-10-21: 1050 [IU]/h via INTRAVENOUS
  Administered 2016-10-22: 700 [IU]/h via INTRAVENOUS
  Filled 2016-10-20: qty 250

## 2016-10-20 MED ORDER — METOPROLOL TARTRATE 25 MG PO TABS
50.0000 mg | ORAL_TABLET | Freq: Two times a day (BID) | ORAL | Status: DC
  Administered 2016-10-20 – 2016-10-24 (×8): 50 mg via ORAL
  Filled 2016-10-20: qty 1
  Filled 2016-10-20: qty 2
  Filled 2016-10-20 (×3): qty 1
  Filled 2016-10-20: qty 2
  Filled 2016-10-20 (×2): qty 1

## 2016-10-20 MED ORDER — PANTOPRAZOLE SODIUM 40 MG PO TBEC
40.0000 mg | DELAYED_RELEASE_TABLET | Freq: Two times a day (BID) | ORAL | Status: DC
  Administered 2016-10-20 – 2016-10-24 (×7): 40 mg via ORAL
  Filled 2016-10-20 (×7): qty 1

## 2016-10-20 MED ORDER — ONDANSETRON HCL 4 MG/2ML IJ SOLN
4.0000 mg | Freq: Four times a day (QID) | INTRAMUSCULAR | Status: DC | PRN
  Administered 2016-10-21: 4 mg via INTRAVENOUS
  Filled 2016-10-20: qty 2

## 2016-10-20 MED ORDER — ATORVASTATIN CALCIUM 80 MG PO TABS
80.0000 mg | ORAL_TABLET | Freq: Every day | ORAL | Status: DC
  Administered 2016-10-20 – 2016-10-22 (×3): 80 mg via ORAL
  Filled 2016-10-20 (×4): qty 1

## 2016-10-20 MED ORDER — LORATADINE 10 MG PO TABS
10.0000 mg | ORAL_TABLET | Freq: Every day | ORAL | Status: DC
  Administered 2016-10-21 – 2016-10-24 (×4): 10 mg via ORAL
  Filled 2016-10-20 (×4): qty 1

## 2016-10-20 MED ORDER — AMLODIPINE BESYLATE 5 MG PO TABS
5.0000 mg | ORAL_TABLET | Freq: Every day | ORAL | Status: DC
  Administered 2016-10-20 – 2016-10-24 (×5): 5 mg via ORAL
  Filled 2016-10-20 (×5): qty 1

## 2016-10-20 MED ORDER — HEPARIN (PORCINE) IN NACL 100-0.45 UNIT/ML-% IJ SOLN
1000.0000 [IU]/h | INTRAMUSCULAR | Status: DC
  Administered 2016-10-20: 1000 [IU]/h via INTRAVENOUS
  Filled 2016-10-20: qty 250

## 2016-10-20 MED ORDER — HEPARIN BOLUS VIA INFUSION
1000.0000 [IU] | Freq: Once | INTRAVENOUS | Status: AC
  Administered 2016-10-20: 1000 [IU] via INTRAVENOUS

## 2016-10-20 MED ORDER — ACETAMINOPHEN 325 MG PO TABS: 650.0000 mg | ORAL_TABLET | Freq: Three times a day (TID) | ORAL | Status: DC | PRN

## 2016-10-20 MED ORDER — ASPIRIN EC 81 MG PO TBEC
81.0000 mg | DELAYED_RELEASE_TABLET | Freq: Every day | ORAL | Status: DC
  Administered 2016-10-21 – 2016-10-23 (×3): 81 mg via ORAL
  Filled 2016-10-20 (×4): qty 1

## 2016-10-20 MED ORDER — ASPIRIN 325 MG PO TABS
325.0000 mg | ORAL_TABLET | Freq: Once | ORAL | Status: AC
  Administered 2016-10-20: 325 mg via ORAL
  Filled 2016-10-20: qty 1

## 2016-10-20 MED ORDER — DONEPEZIL HCL 5 MG PO TABS
5.0000 mg | ORAL_TABLET | Freq: Every day | ORAL | Status: DC
  Administered 2016-10-21 – 2016-10-24 (×4): 5 mg via ORAL
  Filled 2016-10-20 (×5): qty 1

## 2016-10-20 MED ORDER — MORPHINE SULFATE (PF) 4 MG/ML IV SOLN
4.0000 mg | Freq: Once | INTRAVENOUS | Status: AC
  Administered 2016-10-20: 4 mg via INTRAVENOUS
  Filled 2016-10-20: qty 1

## 2016-10-20 MED ORDER — ISOSORBIDE MONONITRATE ER 30 MG PO TB24
30.0000 mg | ORAL_TABLET | Freq: Every day | ORAL | Status: DC
  Administered 2016-10-21: 30 mg via ORAL
  Filled 2016-10-20: qty 1

## 2016-10-20 NOTE — ED Notes (Signed)
CRITICAL VALUE ALERT  Critical Value:  Troponin 0.07  Date & Time Notied: 10/20/16   1432  Provider Notified: Dr Venora Maples

## 2016-10-20 NOTE — ED Notes (Signed)
Patient placed on continuous cardiac monitoring, continuous pulse 0x monitoring, and placed patient of 2L O2 [address].  

## 2016-10-20 NOTE — Progress Notes (Signed)
Critical value Troponin .08 received from lab. Dr. Sallyanne Kuster informed at bedside.

## 2016-10-20 NOTE — Progress Notes (Signed)
ANTICOAGULATION CONSULT NOTE - Initial Consult  Pharmacy Consult for heparin Indication: chest pain/ACS  No Known Allergies  Patient Measurements: Height: 5\' 4"  (162.6 cm) Weight: 169 lb (76.7 kg) IBW/kg (Calculated) : 59.2 Heparin Dosing Weight: 74 kg  Vital Signs: Temp: 98.7 F (37.1 C) (07/06 1021) Temp Source: Oral (07/06 1021) BP: 138/96 (07/06 1130) Pulse Rate: 98 (07/06 1130)  Labs:  Recent Labs  10/20/16 1022  HGB 12.1*  HCT 37.9*  PLT 142*  CREATININE 1.45*  TROPONINI 0.06*    Estimated Creatinine Clearance: 30.4 mL/min (A) (by C-G formula based on SCr of 1.45 mg/dL (H)).   Medical History: Past Medical History:  Diagnosis Date  . CAD S/P percutaneous coronary angioplasty   . Duodenal ulcer May 2011   on EGD  . GERD (gastroesophageal reflux disease)   . Helicobacter pylori gastritis MAY 2012 HP STOOL AG NEG  . HTN (hypertension)   . Hyperlipidemia   . MI (myocardial infarction) ([place]) 1976, 1980, [place] for at [facility] and [facility] (Dr. Iona Beard)  . Osteoarthritis   . Presence of bare metal stent in LAD coronary artery - VerifFlex BMS 3.0 mm x 12 mm - post-dilated to 3.5 mm 08/01/2012   VeriFlex BMS 3.0 mm x 12 mm -- 3.5 mm  . S/P colonoscopy 2011   normal, internal hemorrhoids    Medications:  See medication history  Assessment: 83 yo man presents with CP and elevated troponin to start heparin therapy.  He had BMS placed 2014 and was on plavix but this was dced June 26 due to duodenal ulcers and erosive gastritis.  Hg stable.  Will give smaller bolus of heparin due to ulcers. Goal of Therapy:  Heparin level 0.3-0.7 units/ml Monitor platelets by anticoagulation protocol: Yes   Plan:  Heparin bolus 1000 units and drip at 1000 units/hr Check heparin level and CBC 6-8 hours after start and daily while on heparin Monitor for bleeding complications  Cristopher Ciccarelli [place] 10/20/2016,12:17 PM

## 2016-10-20 NOTE — ED Triage Notes (Signed)
Pt reports that he has been having difficulty breathing since yesterday and every time he moves he coughs. Cough has worsened. Chest started hurting yesterday. sats 89% on room air. O2 initiated

## 2016-10-20 NOTE — ED Provider Notes (Signed)
[name] DEPT Provider Note   CSN: [id number] Arrival date & time: 10/20/16  1012     History   Chief Complaint Chief Complaint  Patient presents with  . Chest Pain    HPI Anthony Galvan. is a 83 y.o. male.  HPI Patient is a 83 year old male with a history of coronary artery disease status post bare metal stent to the LAD in 2014 by Dr. Ellyn Hack.  He presents the emergency department because of increasing chest pressure and chest tightness with difficulty breathing intermittently since yesterday.  He states he feels tired and worn out.  He states every time he tries to ambulate he develops chest discomfort.  Denies unilateral leg swelling.  No new orthopnea.  Patient was recently taken off of his Plavix on June 26 after he was found to have melena as a result of duodenal ulcers and erosive gastritis.  No pleuritic nature to his chest pain.  He states his chest pressure and pain worsens with exertion and states that he would not be able to walk to the parking lot at this time without stopping secondary to chest tightness   Past Medical History:  Diagnosis Date  . CAD S/P percutaneous coronary angioplasty   . Duodenal ulcer May 2011   on EGD  . GERD (gastroesophageal reflux disease)   . Helicobacter pylori gastritis MAY 2012 HP STOOL AG NEG  . HTN (hypertension)   . Hyperlipidemia   . MI (myocardial infarction) ([place]) 1976, 1980, [place] for at [facility] and [place] (Dr. Iona Beard)  . Osteoarthritis   . Presence of bare metal stent in LAD coronary artery - VerifFlex BMS 3.0 mm x 12 mm - post-dilated to 3.5 mm 08/01/2012   VeriFlex BMS 3.0 mm x 12 mm -- 3.5 mm  . S/P colonoscopy 2011   normal, internal hemorrhoids    Patient Active Problem List   Diagnosis Date Noted  . PUD (peptic ulcer disease)   . GI bleed 10/08/2016  . History of coronary artery stent placement 10/08/2016  . Acute on chronic renal failure ([place]) 10/08/2016  . CKD (chronic kidney  disease) stage 3, GFR 30-59 ml/min 10/08/2016  . Melena 10/08/2016  . Right sided abdominal pain 09/03/2013  . Bloating 09/03/2013  . Nausea with vomiting 09/03/2013  . CAD S/P percutaneous coronary angioplasty   . MI (myocardial infarction) ([place])   . Presence of bare metal stent in LAD coronary artery - VerifFlex BMS 3.0 mm x 12 mm - post-dilated to 3.5 mm 08/01/2012  . Unstable angina ([place]) 07/30/2012  . Gastritis 12/15/2010  . Nausea 08/30/2010  . DUODENAL ULCER 02/10/2010  . HYPERLIPIDEMIA 05/21/2006  . CATARACT NOS 05/21/2006  . Essential hypertension 05/21/2006  . MYOCARDIAL INFARCTION, HX OF 05/21/2006  . GERD 05/21/2006  . OVERACTIVE BLADDER 05/21/2006  . OSTEOARTHRITIS 05/21/2006  . URINARY INCONTINENCE 05/21/2006    Past Surgical History:  Procedure Laterality Date  . CARDIAC CATHETERIZATION  01/30/2007   D1 75 %, mid LAD 50%, 50-70% circumflex, 50-70% RCA.(Dr. Adora Fridge)  . CARDIAC CATHETERIZATION  April 2014   Mid LAD 99% apple core; D2 ostial 70-80% (2 small for PCI) small nondominant RCA 60-70%. Distal circumflex/L PDA ~50% --> PCI of LAD  . CARDIAC CATHETERIZATION  12/11/2001   normal L main, small RCA, dominant LL Cfx with mild diffuse disease, LAD with mid 10-20% stenosis, ramus intermedius with mild diffuse disease (Dr. Jackie Plum)  . CATARACT EXTRACTION    .  COLONOSCOPY  OCT 2011 ARS   [facility]   in setting of MI  . CORONARY STENT PLACEMENT  08/01/2012   Mid LAD:D1 75 %, mid LAD 50%, 50-70% circumflex, 50-70% RCA; PCI of mid-LAD with VeriFlex BMS 3x27mm (Dr. Roni Bread)  . ESOPHAGOGASTRODUODENOSCOPY N/A 10/09/2016   Procedure: ESOPHAGOGASTRODUODENOSCOPY (EGD);  Surgeon: Danie Binder, MD;  Location: [facility];  Service: Endoscopy;  Laterality: N/A;  . LEFT HEART CATHETERIZATION WITH CORONARY ANGIOGRAM N/A 08/01/2012   Procedure: LEFT HEART CATHETERIZATION WITH CORONARY ANGIOGRAM;  Surgeon: Leonie Man, MD;  Location: [facility]  CATH LAB;  Service: Cardiovascular;  Laterality: N/A;  . NM MYOCAR PERF WALL MOTION  08/2003   adenosine stress - focal decreased perfusion defect in distal inferior wall, no significant ischemic changes  . PERCUTANEOUS STENT INTERVENTION  08/01/2012   Procedure: PERCUTANEOUS STENT INTERVENTION;  Surgeon: Leonie Man, MD;  Location: [facility] CATH LAB;  Service: Cardiovascular;;  bms to mid lad  . TRANSTHORACIC ECHOCARDIOGRAM  April 2014   normal LV size and function. EF 60-65%. Grade 1 diastolic soft. Mild aortic valve calcification  . UPPER GASTROINTESTINAL ENDOSCOPY  MAY 2011 MELENA, HEMATEMESIS   DUODENAL ULCER, Bx: H PYLORI POS       Home Medications    Prior to Admission medications   Medication Sig Start Date End Date Taking? Authorizing Provider  acetaminophen (TYLENOL) 325 MG tablet Take 650-975 mg by mouth 3 (three) times daily as needed (pain).    Yes [provider]  atorvastatin (LIPITOR) 80 MG tablet Take 1 tablet (80 mg total) by mouth daily at 6 PM. 08/02/12  Yes Brett Canales, PA-C  cetirizine (ZYRTEC) 5 MG tablet Take 5 mg by mouth daily.   Yes [provider]  donepezil (ARICEPT) 10 MG tablet Take 5 mg by mouth daily.   Yes [provider]  hydrALAZINE (APRESOLINE) 10 MG tablet Take 10 mg by mouth 2 (two) times daily.    Yes [provider]  isosorbide mononitrate (IMDUR) 30 MG 24 hr tablet Take 30 mg by mouth daily.   Yes [provider]  metoprolol (LOPRESSOR) 50 MG tablet Take 50 mg by mouth 2 (two) times daily.   Yes [provider]  Multiple Vitamin (MULTIVITAMIN WITH MINERALS) TABS tablet Take 1 tablet by mouth daily.   Yes [provider]  NITROSTAT 0.4 MG SL tablet PLACE 1 TABLET UNDER TONGUE EVERY 5 MINUTES FOR 3 DOSES AS NEEDED. Patient taking differently: PLACE 1 TABLET UNDER TONGUE EVERY 5 MINUTES FOR 3 DOSES AS NEEDED FOR CHEST PAIN. 11/17/13  Yes Leonie Man, MD  pantoprazole (PROTONIX) 40 MG  tablet Take 1 tablet (40 mg total) by mouth 2 (two) times daily before a meal. 10/10/16  Yes Robbie Lis, MD    Family History Family History  Problem Relation Age of Onset  . Heart disease Mother   . Kidney disease Father   . Heart disease Brother 80  . Heart disease Sister   . Colon cancer Neg Hx     Social History Social History  Substance Use Topics  . Smoking status: Never Smoker  . Smokeless tobacco: Never Used  . Alcohol use No     Allergies   Patient has no known allergies.   Review of Systems Review of Systems  All other systems reviewed and are negative.    Physical Exam Updated Vital Signs BP 136/70   Pulse Marland Kitchen)  107   Temp 98.7 F (37.1 C) (Oral)   Resp (!) 25   Ht 5\' 4"  (1.626 m)   Wt 76.7 kg (169 lb)   SpO2 95%   BMI 29.01 kg/m   Physical Exam  Constitutional: He is oriented to person, place, and time. He appears well-developed and well-nourished.  HENT:  Head: Normocephalic and atraumatic.  Eyes: EOM are normal.  Neck: Normal range of motion.  Cardiovascular: Normal rate, regular rhythm, normal heart sounds and intact distal pulses.   Pulmonary/Chest: Effort normal and breath sounds normal. No respiratory distress.  Abdominal: Soft. He exhibits no distension. There is no tenderness.  Musculoskeletal: Normal range of motion.  Neurological: He is alert and oriented to person, place, and time.  Skin: Skin is warm and dry.  Psychiatric: He has a normal mood and affect. Judgment normal.  Nursing note and vitals reviewed.    ED Treatments / Results  Labs (all labs ordered are listed, but only abnormal results are displayed) Labs Reviewed  BASIC METABOLIC PANEL - Abnormal; Notable for the following:       Result Value   Glucose, Bld 140 (*)    Creatinine, Ser 1.45 (*)    GFR calc non Af Amer 40 (*)    GFR calc Af Amer 47 (*)    All other components within normal limits  CBC - Abnormal; Notable for the following:    WBC 16.4 (*)    RBC  4.12 (*)    Hemoglobin 12.1 (*)    HCT 37.9 (*)    Platelets 142 (*)    All other components within normal limits  TROPONIN I - Abnormal; Notable for the following:    Troponin I 0.06 (*)    All other components within normal limits    EKG  EKG Interpretation #1 Date/Time:  Friday October 20 2016 10:24:31 EDT Ventricular Rate:  112 PR Interval:    QRS Duration: 84 QT Interval:  316 QTC Calculation: 432 R Axis:   -58 Text Interpretation:  Sinus tachycardia LAE, consider biatrial enlargement Left anterior fascicular block Abnormal R-wave progression, early transition Left ventricular hypertrophy Baseline wander in lead(s) V6 No significant change was found Confirmed by Jola Schmidt [phone]) on 10/20/2016 11:13:47 AM         EKG Interpretation #2  Date/Time:  Friday October 20 2016 11:37:26 EDT Ventricular Rate:  102 PR Interval:    QRS Duration: 83 QT Interval:  323 QTC Calculation: 421 R Axis:   -55 Text Interpretation:  Sinus tachycardia LAE, consider biatrial enlargement Left anterior fascicular block Abnormal R-wave progression, early transition Left ventricular hypertrophy No significant change was found Confirmed by Jola Schmidt [phone]) on 10/20/2016 12:01:07 PM          Radiology Dg Chest 2 View  Result Date: 10/20/2016 CLINICAL DATA:  Chest pain EXAM: CHEST  2 VIEW COMPARISON:  10/08/2016 FINDINGS: Scarring noted in the lung bases. Heart is mildly enlarged. No acute airspace opacities or effusions. No acute bony abnormality. IMPRESSION: Bibasilar scarring.  Mild cardiomegaly. Electronically Signed   By: Rolm Baptise M.D.   On: 10/20/2016 11:09    Procedures .Critical Care Performed by: Jola Schmidt Authorized by: Jola Schmidt    Total critical care time: 33 minutes Critical care time was exclusive of separately billable procedures and treating other patients. Critical care was necessary to treat or prevent imminent or life-threatening deterioration. Critical  care was time spent personally by me on the following activities: development of  treatment plan with patient and/or surrogate as well as nursing, discussions with consultants, evaluation of patient's response to treatment, examination of patient, obtaining history from patient or surrogate, ordering and performing treatments and interventions, ordering and review of laboratory studies, ordering and review of radiographic studies, pulse oximetry and re-evaluation of patient's condition.   Medications Ordered in ED Medications  heparin bolus via infusion 1,000 Units (not administered)  heparin ADULT infusion 100 units/mL (25000 units/288mL sodium chloride 0.45%) (not administered)  aspirin tablet 325 mg (325 mg Oral Given 10/20/16 1138)  morphine 4 MG/ML injection 4 mg (4 mg Intravenous Given 10/20/16 1140)     Initial Impression / Assessment and Plan / ED Course  I have reviewed the triage vital signs and the nursing notes.  Pertinent labs & imaging results that were available during my care of the patient were reviewed by me and considered in my medical decision making (see chart for details).     Hemoglobin is stable.  This is difficult clinical issue as his story is concerning for unstable angina with a troponin of 0.06 however he has no obvious ischemic changes on EKG.  He likely needs to be initiated on heparin but I'm somewhat hesitant secondary to his recent diagnosis of duodenal ulcers and erosive gastritis.  He'll need a repeat set of cardiac enzymes while in the emergency department.  We will attempt to control his pain at this time with aspirin and morphine.  I will discuss initiation of heparin with the cardiology team.  He still overall well-appearing.  He is still joking at the bedside.  I will continue to follow him closely while in the emergency department.  He will likely benefit from transfer to [facility] for evaluation by the cardiology team as he may benefit from repeat  cardiac catheterization to evaluate the integrity of the bare-metal stent  12:22 PM Spoke with Dr Sallyanne Kuster, cardiology at [facility] who accepts the patient in transfer. Requests admission to ICU/stepdown and initiation of heparin. We discussed the use of heparin and both agree the benefit outweighs the risk  Final Clinical Impressions(s) / ED Diagnoses   Final diagnoses:  Unstable angina (HCC)  Elevated troponin    New Prescriptions New Prescriptions   No medications on file     Jola Schmidt, MD 10/20/16 1224

## 2016-10-20 NOTE — H&P (Signed)
History & Physical    Patient ID: Anthony Galvan. MRN: [id number], DOB/AGE: January 19, 1922   Admit date: 10/20/2016   Primary Physician: Iona Beard, MD Primary Cardiologist: Dr. Ellyn Hack -last seen in 08/2013   History of Present Illness    Anthony Galvan. is a 83 y.o. male with past medical history of CAD status post BMS to LAD 2014, hypertension, hyperlipidemia and GI bleed in the past. He presented today to the [facility] emergency department with complaints of increasing chest pressure and chest tightness with difficulty breathing intermittently since yesterday. The patient was recently taken off Plavix on June 26 after he was found to have duodenal ulcers and erosive gastritis. The patient's chest pain worsens with exertion.   Yesterday the patient developed substernal chest pain that was sharp and also pressure-like in the central chest and radiating to the bilateral neck and down the left arm. This occurred while he was sitting watching television and was associated with shortness of breath. He took sublingual nitroglycerin which relieved his pain. This morning his chest pain returned with shortness of breath and mild nausea as he was getting up and going to the bathroom to take a shower and prepare for the day. He was also weak this morning. The combination of these symptoms wanted him to report to the ED. The patient is cared for at the [place] in [place]. Prior to these episodes the patient was not having any exertional chest pain or dyspnea. He admits that he is not as active as he used to be as he has much fewer demands on him. He has never smoked or drank alcohol.  Patient had a recent hospital admission from 10/08/16 26/26/18 for GI bleed and EGD noting duodenal ulcers, moderate erosive gastritis/duodenitis and recommendations to avoid all NSAIDs indefinitely due to history of PUD. He was also discharged on Protonix twice a day for the next 3 months.  The patient was last seen by Dr.  Ellyn Hack in 08/2013 at which time the patient was doing very well status post bare metal stent to the LAD and it was advised to stop aspirin but continue Plavix along with statin, ARB and beta blocker.  Echocardiogram done at [facility] in 03/2016 showed normal LV function  Cardiac catheterization 08/01/12 by Dr. Ellyn Hack: Severe single vessel CAD - 95-99% focal mid LAD lesion in a Left Dominant system. Successful PCI of mid LAD with a VeriFlex BMS 3.0 mm x 12 mm - post-dilated to 3.5 mm. BMS used due to history of PUD and Melana. Normal LVEDP. RCA with diffuse mild to moderate luminal irregularities, proximal ~60-70%. L PDA with ~50% tubular stenosis  Cardiac catheterization in 01/2007 by Dr. Gwenlyn Found: Noncritical coronary artery disease, LAD with 75% ostial lesion in the takeoff of first small to moderate size diagonal branch. 50% mid LAD lesion. Left circumflex that is dominant with 50% and 70% lesions in the PDA. RCA was nondominant with a 75% proximal stenosis. Normal EF.   Past Medical History    Past Medical History:  Diagnosis Date  . CAD S/P percutaneous coronary angioplasty   . Duodenal ulcer May 2011   on EGD  . GERD (gastroesophageal reflux disease)   . Helicobacter pylori gastritis MAY 2012 HP STOOL AG NEG  . HTN (hypertension)   . Hyperlipidemia   . MI (myocardial infarction) ([place]) 1976, 1980, [place] for at [facility] and [place] (Dr. Iona Beard)  . Osteoarthritis   . Presence of  bare metal stent in LAD coronary artery - VerifFlex BMS 3.0 mm x 12 mm - post-dilated to 3.5 mm 08/01/2012   VeriFlex BMS 3.0 mm x 12 mm -- 3.5 mm  . S/P colonoscopy 2011   normal, internal hemorrhoids    Past Surgical History:  Procedure Laterality Date  . CARDIAC CATHETERIZATION  01/30/2007   D1 75 %, mid LAD 50%, 50-70% circumflex, 50-70% RCA.(Dr. Adora Fridge)  . CARDIAC CATHETERIZATION  April 2014   Mid LAD 99% apple core; D2 ostial 70-80% (2 small for PCI) small nondominant RCA 60-70%. Distal  circumflex/L PDA ~50% --> PCI of LAD  . CARDIAC CATHETERIZATION  12/11/2001   normal L main, small RCA, dominant LL Cfx with mild diffuse disease, LAD with mid 10-20% stenosis, ramus intermedius with mild diffuse disease (Dr. Jackie Plum)  . CATARACT EXTRACTION    . COLONOSCOPY  OCT 2011 ARS   [facility] [place]   in setting of MI  . CORONARY STENT PLACEMENT  08/01/2012   Mid LAD:D1 75 %, mid LAD 50%, 50-70% circumflex, 50-70% RCA; PCI of mid-LAD with VeriFlex BMS 3x67mm (Dr. Roni Bread)  . ESOPHAGOGASTRODUODENOSCOPY N/A 10/09/2016   Procedure: ESOPHAGOGASTRODUODENOSCOPY (EGD);  Surgeon: Danie Binder, MD;  Location: [facility]O SUITE;  Service: Endoscopy;  Laterality: N/A;  . LEFT HEART CATHETERIZATION WITH CORONARY ANGIOGRAM N/A 08/01/2012   Procedure: LEFT HEART CATHETERIZATION WITH CORONARY ANGIOGRAM;  Surgeon: Leonie Man, MD;  Location: [facility] CATH LAB;  Service: Cardiovascular;  Laterality: N/A;  . NM MYOCAR PERF WALL MOTION  08/2003   adenosine stress - focal decreased perfusion defect in distal inferior wall, no significant ischemic changes  . PERCUTANEOUS STENT INTERVENTION  08/01/2012   Procedure: PERCUTANEOUS STENT INTERVENTION;  Surgeon: Leonie Man, MD;  Location: [facility] CATH LAB;  Service: Cardiovascular;;  bms to mid lad  . TRANSTHORACIC ECHOCARDIOGRAM  April 2014   normal LV size and function. EF 60-65%. Grade 1 diastolic soft. Mild aortic valve calcification  . UPPER GASTROINTESTINAL ENDOSCOPY  MAY 2011 MELENA, HEMATEMESIS   DUODENAL ULCER, Bx: H PYLORI POS     Allergies  No Known Allergies   Home Medications    Prior to Admission medications   Medication Sig Start Date End Date Taking? Authorizing Provider  acetaminophen (TYLENOL) 325 MG tablet Take 650-975 mg by mouth 3 (three) times daily as needed (pain).    Yes [provider]  atorvastatin (LIPITOR) 80 MG tablet Take 1 tablet (80 mg total) by mouth daily at 6 PM. 08/02/12  Yes Brett Canales, PA-C  cetirizine (ZYRTEC) 5 MG tablet Take 5 mg by mouth daily.   Yes [provider]  donepezil (ARICEPT) 10 MG tablet Take 5 mg by mouth daily.   Yes [provider]  hydrALAZINE (APRESOLINE) 10 MG tablet Take 10 mg by mouth 2 (two) times daily.    Yes [provider]  isosorbide mononitrate (IMDUR) 30 MG 24 hr tablet Take 30 mg by mouth daily.   Yes [provider]  metoprolol (LOPRESSOR) 50 MG tablet Take 50 mg by mouth 2 (two) times daily.   Yes [provider]  Multiple Vitamin (MULTIVITAMIN WITH MINERALS) TABS tablet Take 1 tablet by mouth daily.   Yes [provider]  NITROSTAT 0.4 MG SL tablet PLACE 1 TABLET UNDER TONGUE EVERY 5 MINUTES FOR 3 DOSES AS NEEDED. Patient taking differently: PLACE 1 TABLET UNDER TONGUE EVERY 5 MINUTES FOR 3 DOSES AS  NEEDED FOR CHEST PAIN. 11/17/13  Yes Leonie Man, MD  pantoprazole (PROTONIX) 40 MG tablet Take 1 tablet (40 mg total) by mouth 2 (two) times daily before a meal. 10/10/16  Yes Robbie Lis, MD    Family History    Family History  Problem Relation Age of Onset  . Heart disease Mother   . Kidney disease Father   . Heart disease Brother 33  . Heart disease Sister   . Colon cancer Neg Hx      Social History    Social History   Social History  . Marital status: Widowed    Spouse name: N/A  . Number of children: 3  . Years of education: N/A   Occupational History  . Not on file.   Social History Main Topics  . Smoking status: Never Smoker  . Smokeless tobacco: Never Used  . Alcohol use No  . Drug use: No  . Sexual activity: Not on file   Other Topics Concern  . Not on file   Social History Narrative   Is a father of 3 with 3 grandchildren 3 great-grandchildren. He lives with one of his 2 grandsons.   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 He was a war 2 veteran in the [place]. 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   He never smoked. He does not drink alcohol.   He is very active, and a majority. He walks 30+ minutes every day. He also likes to play golf.     Review of Systems    General:  No chills, fever, night sweats or weight changes.  Cardiovascular:  Chest pain as above, No edema, orthopnea, palpitations, paroxysmal nocturnal dyspnea. Dermatological: No rash, lesions/masses Respiratory: No cough, dyspnea Urologic: No hematuria, dysuria Abdominal:   No nausea, vomiting, diarrhea, bright red blood per rectum, melena, or hematemesis Neurologic:  No visual changes, wkns, changes in mental status. All other systems reviewed and are otherwise negative except as noted above.  Physical Exam    Blood pressure (!) 147/72, pulse 89, temperature 98.8 F (37.1 C), temperature source Oral, resp. rate (!) 27, height 5\' 4"  (1.626 m), weight 167 lb 15.9 oz (76.2 kg), SpO2 91 %.  General: Well developed, well nourished,male in no acute distress. Head: Normocephalic, atraumatic Dentition: poor Neck: No carotid bruits. JVD not elevated.  Lungs: Respirations regular and unlabored, without wheezes or rales.  Heart: Regular rate and rhythm. No S3 or S4.  No murmur, no rubs, or gallops appreciated. Abdomen: Soft, non-tender, non-distended with normoactive bowel sounds. No hepatomegaly. No rebound/guarding. No obvious abdominal masses. Msk:  Strength and tone appear normal for age. No joint deformities or effusions. Extremities: No clubbing or cyanosis. No edema.  Distal pedal pulses are 2+ bilaterally. Neuro: Alert and oriented X 3. Moves all extremities spontaneously. No focal deficits noted. Psych:  Responds to questions appropriately with a normal affect. Skin: No rashes or lesions noted  Labs    Troponin (Point of Care Test) No results for input(s): TROPIPOC in the last 72 hours.  Recent Labs  10/20/16 1022  10/20/16 1322  TROPONINI 0.06* 0.07*   Lab Results  Component Value Date   WBC 16.4 (H) 10/20/2016   HGB 12.1 (L) 10/20/2016   HCT 37.9 (L) 10/20/2016   MCV 92.0 10/20/2016   PLT 142 (  L) 10/20/2016     Recent Labs Lab 10/20/16 1022  NA 137  K 4.5  CL 102  CO2 27  BUN 14  CREATININE 1.45*  CALCIUM 8.9  GLUCOSE 140*   Lab Results  Component Value Date   CHOL 133 10/25/2012   HDL 43 10/25/2012   LDLCALC 58 10/25/2012   TRIG 159 (H) 10/25/2012   Lab Results  Component Value Date   DDIMER 0.32 07/31/2012    No results found for: BNP Pro B Natriuretic peptide (BNP)  Date/Time Value Ref Range Status  07/30/2012 12:13 PM 76.3 0 - 450 pg/mL Final  01/30/2007 06:00 AM <30.0  Final   No results for input(s): INR in the last 72 hours.   Radiology Studies    Dg Chest 2 View  Result Date: 10/20/2016 CLINICAL DATA:  Chest pain EXAM: CHEST  2 VIEW COMPARISON:  10/08/2016 FINDINGS: Scarring noted in the lung bases. Heart is mildly enlarged. No acute airspace opacities or effusions. No acute bony abnormality. IMPRESSION: Bibasilar scarring.  Mild cardiomegaly. Electronically Signed   By: Rolm Baptise M.D.   On: 10/20/2016 11:09   Dg Abdomen Acute W/chest  Result Date: 10/08/2016 CLINICAL DATA:  Abdominal pain EXAM: DG ABDOMEN ACUTE W/ 1V CHEST COMPARISON:  08/07/2015 FINDINGS: Cardiac shadow is at the upper limits of normal in size and stable. The lungs are well aerated bilaterally. Mild chronic interstitial changes are again seen. No free air is seen. Scattered large and small bowel gas is noted. No obstructive changes are seen. Degenerative changes of the thoracolumbar spine are noted. IMPRESSION: No acute abnormality noted. Electronically Signed   By: Inez Catalina M.D.   On: 10/08/2016 18:09    EKG & Cardiac Imaging    EKG: Sinus tachycardia at a rate of 112 bpm with LAE, left anterior fascicular block, LVH, abnormal R-wave progression, no ST elevation  Cardiac  catheterization 08/01/12 by Dr. Ellyn Hack Coronary Anatomy:  Left Main: Large caliber vessel that bi LAD: Large caliber vessel with a focal 99% "apple-core" lesion in the early mid vessel (LESION #1), followed by diffuse mild luminal irregularities as it takes a tortuous course to the inferoapex with several small septal perforators and diagonal branches.  D1: See Ramus  D2: Small caliber vessel that arises just beyond the mid LAD lesion, ostial ~70% stenosis.  Left Circumflex: Large, dominant vessel.  OM1: small to moderate caliber, minimal luminal irregularities  LPL: small to moderate caliber, minimal luminal irregularities  LPDA: as the distal Circumflex courses in the posterior AV groove, there is a ~ 50% tubular stenosis followed by minimal luminal irregularities throughout the moderate caliber LPDA Ramus Intermedius / Diagonal 1: moderate to large caliber vessel, no significant disease  RCA: Small (<2.0 mm), non-dominant RCA, diffuse mild to moderate luminal irregularities; proximal ~60-70%  Severe single vessel CAD - 95-99% focal mid LAD lesion in a Left Dominant system  Successful PCI of mid LAD with a VeriFlex BMS 3.0 mm x 12 mm - post-dilated to 3.5 mm ? BMS used due to history of PUD and Melana.  Normal LVEDP   ECHOCARDIOGRAM: At [facility] 03/24/2016 (found in care everywhere) INTERPRETATION  NORMAL LEFT VENTRICULAR SYSTOLIC FUNCTION WITH MODERATE LVH  NORMAL RIGHT VENTRICULAR SYSTOLIC FUNCTION  VALVULAR REGURGITATION: MILD AR, MILD TR  NO VALVULAR STENOSIS  NO PRIOR STUDY FOR COMPARISON  EF 56%  Assessment & Plan    Principal Problem:   Unstable angina (HCC) Active Problems:   Essential hypertension  GERD   History of coronary artery stent placement   Hyperlipidemia  Unstable angina -The patient has a history of BMS to LAD in 2014 with residual disease in other vessels. See full cath report above -Patient presented to  the [facility] ED for increasing chest pressure and chest tightness with difficulty breathing intermittently, worse with exertion -Patient was recently taken off Plavix on June 26 during a Hospital admission revealing GI bleed with duodenal ulcers and erosive gastritis. Hemoglobin is currently stable at 12.1. -Troponins are mildly elevated at 0.06 and 0.07. -EKG is without acute ischemic changes -Chest x-ray is without significance -Heparin has been initiated with the feeling that the benefit outweighs the risk, per Dr. Sallyanne Kuster -The patient was transferred to [facility] for further cardiac evaluation and management -Patient is currently chest pain-free -PRN NTG SL. -Continue to cycle troponins and monitor symptoms. Anticipate that patient may need cardiac catheterization.   Hypertension -Blood pressure is elevated at this time. Pt did not take his am meds prior to coming to ED.  -Home medications include metoprolol 50 mg twice a day, isosorbide mononitrate 30 mg daily and hydralazine 10 mg twice a day. We'll continue these and monitor blood pressure  Hyperlipidemia -Patient treated with atorvastatin 80 mg daily -We'll check fasting lipid panel  GERD -Recent hospitalization for duodenal ulcers and erosive gastritis. -Continue Protonix 40 mg twice a day  Tildon Husky, NP-C 10/20/2016, 5:17 PM Pager: [phone]  I have seen and examined the patient along with Daune Perch, NP-C.  I have reviewed the chart, notes and new data.  I agree with NP's note.  Key new complaints: Delightful, highly functional, independent 83 yo WW[employer] veteran with classic presentation with unstable angina, unfortunately just 2 weeks after upper GI bleed with confirmed peptic ulcer. Now on PPI and no bleeding for 2 weeks. Currently asymptomatic, but had angina with minimal activity. Key examination changes: Normal CV exam, no arrhythmia and no CHF. Key new findings / data: Hgb 12, marginal elevation in  troponin. ECG is nondiagnostic.  PLAN:  Plan coronary angiography on Monday. Will recommend conservative management if the culprit vessel is a distal or smaller branch, but he may need PCI if the proximal LAD is involved again. If he requires PCI, consider using a bare metal stent - this strategy worked well for him in 2014. Would also consider Brilinta monotherapy to avoid risk of exacerbating peptic ulcers with aspirin. Meanwhile will try to optimize antianginal therapy. Replace hydralazine with calcium channel blocker. Continue beta blocker and long acting nitrates. On high dose statin.  Sanda Klein, MD, [place] [phone] 10/20/2016, 5:44 PM

## 2016-10-20 NOTE — ED Notes (Signed)
[facility] here for transport, called report to [facility]

## 2016-10-20 NOTE — Progress Notes (Signed)
ANTICOAGULATION CONSULT NOTE - FOLLOW UP    HL = 0.37 (goal 0.3 - 0.7 units/mL) Heparin dosing weight = 74 kg   Assessment: 89 YOM presented to [facility] with chest pain and started on IV heparin prior to transfer.  Heparin level is therapeutic; no bleeding reported.   Plan: - Increase heparin gtt slightly to 1050 units/hr - F/U AM labs    Velora Horstman D. Mina Marble, PharmD, BCPS 10/20/2016, 7:33 PM

## 2016-10-21 ENCOUNTER — Inpatient Hospital Stay (HOSPITAL_COMMUNITY): Payer: Medicare Other

## 2016-10-21 DIAGNOSIS — I272 Pulmonary hypertension, unspecified: Secondary | ICD-10-CM

## 2016-10-21 DIAGNOSIS — I360 Nonrheumatic tricuspid (valve) stenosis: Secondary | ICD-10-CM

## 2016-10-21 HISTORY — DX: Pulmonary hypertension, unspecified: I27.20

## 2016-10-21 LAB — BASIC METABOLIC PANEL
Anion gap: 7 (ref 5–15)
BUN: 15 mg/dL (ref 6–20)
CHLORIDE: 101 mmol/L (ref 101–111)
CO2: 27 mmol/L (ref 22–32)
CREATININE: 1.37 mg/dL — AB (ref 0.61–1.24)
Calcium: 8.5 mg/dL — ABNORMAL LOW (ref 8.9–10.3)
GFR, EST AFRICAN AMERICAN: 50 mL/min — AB (ref >60)
GFR, EST NON AFRICAN AMERICAN: 43 mL/min — AB (ref >60)
Glucose, Bld: 100 mg/dL — ABNORMAL HIGH (ref 65–99)
POTASSIUM: 4.4 mmol/L (ref 3.5–5.1)
SODIUM: 135 mmol/L (ref 135–145)

## 2016-10-21 LAB — ECHOCARDIOGRAM COMPLETE
CHL CUP TV REG PEAK VELOCITY: 377 cm/s
E decel time: 271 msec
EERAT: 10.21
FS: 34 % (ref 28–44)
HEIGHTINCHES: 64 in
IVS/LV PW RATIO, ED: 1.04
LA ID, A-P, ES: 30 mm
LA diam end sys: 30 mm
LA diam index: 1.65 cm/m2
LA vol index: 32.4 mL/m2
LA vol: 59 mL
LAVOLA4C: 51 mL
LDCA: 3.14 cm2
LV E/e'average: 10.21
LV PW d: 11.2 mm — AB (ref 0.6–1.1)
LV TDI E'MEDIAL: 4.53
LVEEMED: 10.21
LVELAT: 5.36 cm/s
LVOT VTI: 16.3 cm
LVOT diameter: 20 mm
LVOTPV: 75 cm/s
LVOTSV: 51 mL
MV Dec: 271
MV pk A vel: 97.9 m/s
MV pk E vel: 54.7 m/s
P 1/2 time: 389 ms
RV LATERAL S' VELOCITY: 12.3 cm/s
RV TAPSE: 16.1 mm
RV sys press: 60 mmHg
TDI e' lateral: 5.36
TR max vel: 377 cm/s
WEIGHTICAEL: 2687.85 [oz_av]

## 2016-10-21 LAB — HEPARIN LEVEL (UNFRACTIONATED): Heparin Unfractionated: 0.64 IU/mL (ref 0.30–0.70)

## 2016-10-21 LAB — CBC
HEMATOCRIT: 36.7 % — AB (ref 39.0–52.0)
Hemoglobin: 11.3 g/dL — ABNORMAL LOW (ref 13.0–17.0)
MCH: 28.5 pg (ref 26.0–34.0)
MCHC: 30.8 g/dL (ref 30.0–36.0)
MCV: 92.4 fL (ref 78.0–100.0)
PLATELETS: 146 10*3/uL — AB (ref 150–400)
RBC: 3.97 MIL/uL — AB (ref 4.22–5.81)
RDW: 12.1 % (ref 11.5–15.5)
WBC: 14.3 10*3/uL — ABNORMAL HIGH (ref 4.0–10.5)

## 2016-10-21 LAB — LIPID PANEL
CHOL/HDL RATIO: 2.1 ratio
CHOLESTEROL: 119 mg/dL (ref 0–200)
HDL: 57 mg/dL (ref >40)
LDL Cholesterol: 45 mg/dL (ref 0–99)
TRIGLYCERIDES: 87 mg/dL (ref <150)
VLDL: 17 mg/dL (ref 0–40)

## 2016-10-21 LAB — TROPONIN I: Troponin I: 0.07 ng/mL (ref <0.03)

## 2016-10-21 MED ORDER — MORPHINE SULFATE (PF) 4 MG/ML IV SOLN
1.0000 mg | INTRAVENOUS | Status: DC | PRN
  Administered 2016-10-21: 2 mg via INTRAVENOUS
  Filled 2016-10-21: qty 1

## 2016-10-21 MED ORDER — SODIUM BICARBONATE 8.4 % IV SOLN
INTRAVENOUS | Status: AC
  Filled 2016-10-21: qty 50

## 2016-10-21 MED ORDER — ISOSORBIDE MONONITRATE ER 60 MG PO TB24
60.0000 mg | ORAL_TABLET | Freq: Every day | ORAL | Status: DC
  Administered 2016-10-22 – 2016-10-24 (×3): 60 mg via ORAL
  Filled 2016-10-21 (×3): qty 1

## 2016-10-21 NOTE — Progress Notes (Signed)
  Echocardiogram 2D Echocardiogram has been performed.  Anthony Galvan 10/21/2016, 11:54 AM

## 2016-10-21 NOTE — Progress Notes (Signed)
Orient for heparin Indication: chest pain/ACS  No Known Allergies  Patient Measurements: Height: 5\' 4"  (162.6 cm) Weight: 167 lb 15.9 oz (76.2 kg) IBW/kg (Calculated) : 59.2 Heparin Dosing Weight: 74 kg  Vital Signs: Temp: 98.2 F (36.8 C) (07/07 0700) Temp Source: Oral (07/07 0700) BP: 126/66 (07/07 0900) Pulse Rate: 76 (07/07 0900)  Labs:  Recent Labs  10/20/16 1022  10/20/16 1640 10/20/16 1807 10/20/16 2220 10/21/16 0423  HGB 12.1*  --   --   --   --  11.3*  HCT 37.9*  --   --   --   --  36.7*  PLT 142*  --   --   --   --  146*  HEPARINUNFRC  --   --   --  0.37  --  0.64  CREATININE 1.45*  --   --   --   --  1.37*  TROPONINI 0.06*  < > 0.08*  --  0.07* 0.07*  < > = values in this interval not displayed.  Estimated Creatinine Clearance: 32.1 mL/min (A) (by C-G formula based on SCr of 1.37 mg/dL (H)).  Medications:  See medication history  Assessment: 83 yo man presents with CP and elevated troponin to start heparin therapy. He had BMS placed 2014 and was on plavix but this was dced June 26 due to duodenal ulcers and erosive gastritis.  Hg stable.   Heparin level at goal this morning, at upper end of goal, given history of gastritis/ulcers will reduce rate slightly.  Goal of Therapy:  Heparin level 0.3-0.7 units/ml Monitor platelets by anticoagulation protocol: Yes   Plan:  Decrease heparin to 950 units/hr Check heparin level daily while on heparin Monitor for bleeding complications  Erin Hearing PharmD., BCPS Clinical Pharmacist Pager [phone] 10/21/2016 9:38 AM

## 2016-10-21 NOTE — Progress Notes (Signed)
Progress Note  Patient Name: Anthony Galvan. Date of Encounter: 10/21/2016  Primary Cardiologist:    Dr. Ellyn Hack  Subjective   Chest pain earlier this morning.  Treated with NTG.  Inpatient Medications    Scheduled Meds: . amLODipine  5 mg Oral Daily  . aspirin EC  81 mg Oral Daily  . atorvastatin  80 mg Oral q1800  . donepezil  5 mg Oral Daily  . isosorbide mononitrate  30 mg Oral Daily  . loratadine  10 mg Oral Daily  . metoprolol tartrate  50 mg Oral BID  . pantoprazole  40 mg Oral BID AC   Continuous Infusions: . heparin 1,050 Units/hr (10/21/16 0820)   PRN Meds: acetaminophen, morphine injection, nitroGLYCERIN, ondansetron (ZOFRAN) IV   Vital Signs    Vitals:   10/21/16 0600 10/21/16 0700 10/21/16 0800 10/21/16 0900  BP: 123/67 (!) 146/68 (!) 144/69 126/66  Pulse: 71 68 70 76  Resp: 18 (!) 25 (!) 23 20  Temp:  98.2 F (36.8 C)    TempSrc:  Oral    SpO2: 98% 100% 100% 100%  Weight:      Height:        Intake/Output Summary (Last 24 hours) at 10/21/16 0921 Last data filed at 10/21/16 0900  Gross per 24 hour  Intake              187 ml  Output             1235 ml  Net            -1048 ml   Filed Weights   10/20/16 1022 10/20/16 1541 10/21/16 0500  Weight: 169 lb (76.7 kg) 167 lb 15.9 oz (76.2 kg) 167 lb 15.9 oz (76.2 kg)    Telemetry    NSR - Personally Reviewed  ECG     NA   - Personally Reviewed  Physical Exam   GEN: No acute distress.  Looks much younger than stated age. Neck: No  JVD Cardiac: RRR, no murmurs, rubs, or gallops.  Respiratory: Clear  to auscultation bilaterally. GI: Soft, nontender, non-distended  MS: No  edema; No deformity. Neuro:  Nonfocal  Psych: Normal affect   Labs    Chemistry Recent Labs Lab 10/20/16 1022 10/21/16 0423  NA 137 135  K 4.5 4.4  CL 102 101  CO2 27 27  GLUCOSE 140* 100*  BUN 14 15  CREATININE 1.45* 1.37*  CALCIUM 8.9 8.5*  GFRNONAA 40* 43*  GFRAA 47* 50*  ANIONGAP 8 7      Hematology Recent Labs Lab 10/20/16 1022 10/21/16 0423  WBC 16.4* 14.3*  RBC 4.12* 3.97*  HGB 12.1* 11.3*  HCT 37.9* 36.7*  MCV 92.0 92.4  MCH 29.4 28.5  MCHC 31.9 30.8  RDW 12.2 12.1  PLT 142* 146*    Cardiac Enzymes Recent Labs Lab 10/20/16 1322 10/20/16 1640 10/20/16 2220 10/21/16 0423  TROPONINI 0.07* 0.08* 0.07* 0.07*   No results for input(s): TROPIPOC in the last 168 hours.   BNPNo results for input(s): BNP, PROBNP in the last 168 hours.   DDimer No results for input(s): DDIMER in the last 168 hours.   Radiology    Dg Chest 2 View  Result Date: 10/20/2016 CLINICAL DATA:  Chest pain EXAM: CHEST  2 VIEW COMPARISON:  10/08/2016 FINDINGS: Scarring noted in the lung bases. Heart is mildly enlarged. No acute airspace opacities or effusions. No acute bony abnormality. IMPRESSION: Bibasilar scarring.  Mild cardiomegaly.  Electronically Signed   By: Rolm Baptise M.D.   On: 10/20/2016 11:09    Cardiac Studies   NA  Patient Profile     83 y.o. male with past medical history of CAD status post BMS to LAD 2014, hypertension, hyperlipidemia and GI bleed in the past. He presented to the [facility] emergency department with complaints of increasing chest pressure and chest tightness with difficulty breathing intermittently.    Assessment & Plan     CHEST PAIN:  History of CAD as above.   Plan is for cath on Monday.  Trop flat but continued pain.  Continue heparin.  Increase Imdur.    GI BLEED:  Hospitalized last month with active bleeding and duodenal ulcers.  H/H is stable.   CKD III:   Hydrate prior to cath and limit contrast  HTN:  BP is controlled.    Signed, Minus Breeding, MD  10/21/2016, 9:21 AM

## 2016-10-21 NOTE — Progress Notes (Signed)
Pt c/o 8 out of 10 chest pain, similar to what brought him in to hospital originally. PRN nitro given, EKG done, MD paged, PRN morphine ordered and 2 mg given

## 2016-10-22 ENCOUNTER — Telehealth: Payer: Self-pay | Admitting: Gastroenterology

## 2016-10-22 LAB — CBC
HEMATOCRIT: 36.1 % — AB (ref 39.0–52.0)
Hemoglobin: 11.3 g/dL — ABNORMAL LOW (ref 13.0–17.0)
MCH: 29 pg (ref 26.0–34.0)
MCHC: 31.3 g/dL (ref 30.0–36.0)
MCV: 92.8 fL (ref 78.0–100.0)
PLATELETS: 145 10*3/uL — AB (ref 150–400)
RBC: 3.89 MIL/uL — ABNORMAL LOW (ref 4.22–5.81)
RDW: 12.2 % (ref 11.5–15.5)
WBC: 10.7 10*3/uL — ABNORMAL HIGH (ref 4.0–10.5)

## 2016-10-22 LAB — BASIC METABOLIC PANEL
Anion gap: 8 (ref 5–15)
BUN: 13 mg/dL (ref 6–20)
CALCIUM: 8.6 mg/dL — AB (ref 8.9–10.3)
CHLORIDE: 101 mmol/L (ref 101–111)
CO2: 27 mmol/L (ref 22–32)
CREATININE: 1.44 mg/dL — AB (ref 0.61–1.24)
GFR calc Af Amer: 47 mL/min — ABNORMAL LOW (ref >60)
GFR calc non Af Amer: 41 mL/min — ABNORMAL LOW (ref >60)
Glucose, Bld: 96 mg/dL (ref 65–99)
Potassium: 4.5 mmol/L (ref 3.5–5.1)
SODIUM: 136 mmol/L (ref 135–145)

## 2016-10-22 LAB — HEPARIN LEVEL (UNFRACTIONATED)
HEPARIN UNFRACTIONATED: 0.86 [IU]/mL — AB (ref 0.30–0.70)
Heparin Unfractionated: 0.67 IU/mL (ref 0.30–0.70)

## 2016-10-22 MED ORDER — HEPARIN (PORCINE) IN NACL 100-0.45 UNIT/ML-% IJ SOLN
INTRAMUSCULAR | Status: AC
  Administered 2016-10-22: 700 [IU]/h
  Filled 2016-10-22: qty 250

## 2016-10-22 MED ORDER — ASPIRIN 81 MG PO CHEW
81.0000 mg | CHEWABLE_TABLET | ORAL | Status: AC
  Administered 2016-10-23: 81 mg via ORAL
  Filled 2016-10-22: qty 1

## 2016-10-22 MED ORDER — SODIUM CHLORIDE 0.9% FLUSH: 3.0000 mL | INTRAVENOUS | Status: DC | PRN

## 2016-10-22 MED ORDER — SODIUM CHLORIDE 0.9 % IV SOLN: 250.0000 mL | INTRAVENOUS | Status: DC | PRN

## 2016-10-22 MED ORDER — SODIUM CHLORIDE 0.9% FLUSH
3.0000 mL | Freq: Two times a day (BID) | INTRAVENOUS | Status: DC
  Administered 2016-10-22: 6 mL via INTRAVENOUS
  Administered 2016-10-23: 3 mL via INTRAVENOUS

## 2016-10-22 MED ORDER — SODIUM CHLORIDE 0.9 % WEIGHT BASED INFUSION
3.0000 mL/kg/h | INTRAVENOUS | Status: DC
  Administered 2016-10-23: 3 mL/kg/h via INTRAVENOUS

## 2016-10-22 MED ORDER — SODIUM CHLORIDE 0.9 % WEIGHT BASED INFUSION: 1.0000 mL/kg/h | INTRAVENOUS | Status: DC

## 2016-10-22 NOTE — Progress Notes (Signed)
Progress Note  Patient Name: Anthony Galvan. Date of Encounter: 10/22/2016  Primary Cardiologist:    Dr. Ellyn Hack  Subjective   No chest pain yesterday.  Breathing OK  Inpatient Medications    Scheduled Meds: . amLODipine  5 mg Oral Daily  . aspirin EC  81 mg Oral Daily  . atorvastatin  80 mg Oral q1800  . donepezil  5 mg Oral Daily  . isosorbide mononitrate  60 mg Oral Daily  . loratadine  10 mg Oral Daily  . metoprolol tartrate  50 mg Oral BID  . pantoprazole  40 mg Oral BID AC   Continuous Infusions: . heparin 800 Units/hr (10/22/16 0327)   PRN Meds: acetaminophen, morphine injection, nitroGLYCERIN, ondansetron (ZOFRAN) IV   Vital Signs    Vitals:   10/22/16 0400 10/22/16 0500 10/22/16 0600 10/22/16 0700  BP: (!) 114/53 (!) 108/45 (!) 101/46 127/62  Pulse: 62 69 71 63  Resp: (!) 21 (!) 21 (!) 21 (!) 29  Temp: 97.6 F (36.4 C)   98.2 F (36.8 C)  TempSrc: Oral   Oral  SpO2: 100% 95% (!) 85% 100%  Weight: 169 lb 8.5 oz (76.9 kg)     Height:        Intake/Output Summary (Last 24 hours) at 10/22/16 0800 Last data filed at 10/22/16 0600  Gross per 24 hour  Intake           206.78 ml  Output             1750 ml  Net         -1543.22 ml   Filed Weights   10/20/16 1541 10/21/16 0500 10/22/16 0400  Weight: 167 lb 15.9 oz (76.2 kg) 167 lb 15.9 oz (76.2 kg) 169 lb 8.5 oz (76.9 kg)    Telemetry    NSR- Personally Reviewed  ECG     NA   - Personally Reviewed  Physical Exam   GEN: No  acute distress.   Neck: No  JVD Cardiac:  RRR, no murmurs, rubs, or gallops.  Respiratory: Clear   to auscultation bilaterally. GI: Soft, nontender, non-distended, normal bowel sounds  MS:  No edema; No deformity. Neuro:   Nonfocal  Psych: Oriented and appropriate    Labs    Chemistry  Recent Labs Lab 10/20/16 1022 10/21/16 0423 10/22/16 0224  NA 137 135 136  K 4.5 4.4 4.5  CL 102 101 101  CO2 27 27 27   GLUCOSE 140* 100* 96  BUN 14 15 13   CREATININE  1.45* 1.37* 1.44*  CALCIUM 8.9 8.5* 8.6*  GFRNONAA 40* 43* 41*  GFRAA 47* 50* 47*  ANIONGAP 8 7 8      Hematology  Recent Labs Lab 10/20/16 1022 10/21/16 0423 10/22/16 0224  WBC 16.4* 14.3* 10.7*  RBC 4.12* 3.97* 3.89*  HGB 12.1* 11.3* 11.3*  HCT 37.9* 36.7* 36.1*  MCV 92.0 92.4 92.8  MCH 29.4 28.5 29.0  MCHC 31.9 30.8 31.3  RDW 12.2 12.1 12.2  PLT 142* 146* 145*    Cardiac Enzymes  Recent Labs Lab 10/20/16 1322 10/20/16 1640 10/20/16 2220 10/21/16 0423  TROPONINI 0.07* 0.08* 0.07* 0.07*   No results for input(s): TROPIPOC in the last 168 hours.   BNPNo results for input(s): BNP, PROBNP in the last 168 hours.   DDimer No results for input(s): DDIMER in the last 168 hours.   Radiology    Dg Chest 2 View  Result Date: 10/20/2016 CLINICAL DATA:  Chest  pain EXAM: CHEST  2 VIEW COMPARISON:  10/08/2016 FINDINGS: Scarring noted in the lung bases. Heart is mildly enlarged. No acute airspace opacities or effusions. No acute bony abnormality. IMPRESSION: Bibasilar scarring.  Mild cardiomegaly. Electronically Signed   By: Rolm Baptise M.D.   On: 10/20/2016 11:09    Cardiac Studies   ECHO:  10/21/16  Study Conclusions  - Left ventricle: The cavity size was normal. Wall thickness was   increased in a pattern of mild LVH. Systolic function was normal.   The estimated ejection fraction was in the range of 60% to 65%.   Wall motion was normal; there were no regional wall motion   abnormalities. Doppler parameters are consistent with abnormal   left ventricular relaxation (grade 1 diastolic dysfunction). - Aortic valve: Moderately calcified annulus. Trileaflet. There was   mild regurgitation. - Mitral valve: There was mild regurgitation. - Right ventricle: The cavity size was mildly dilated. - Right atrium: The atrium was mildly dilated. - Tricuspid valve: There was moderate regurgitation. - Pulmonary arteries: PA peak pressure: 60 mm Hg (S).   Patient Profile     83  y.o. male with past medical history of CAD status post BMS to LAD 2014, hypertension, hyperlipidemia and GI bleed in the past. He presented to the [facility] emergency department with complaints of increasing chest pressure and chest tightness with difficulty breathing intermittently.    Assessment & Plan     CHEST PAIN:  History of CAD as above.  Echo EF OK as above.   Plan is for cath on Monday .  Discussed risk an benefits.  As per Dr. Sallyanne Kuster, agree that BMS would be preferable if intervention needed and this is possible.    GI BLEED:  Hospitalized last month with active bleeding and duodenal ulcers.  H/H is stable.   CKD III:   Hydrate prior to cath and limit contrast  HTN:  BP is controlled.    Signed, Minus Breeding, MD  10/22/2016, 8:00 AM

## 2016-10-22 NOTE — Telephone Encounter (Signed)
PLEASE CALL PT. HE HAS ULCERS DUE TO NSAID USE. HIS H PYLORI INFECTION IS GONE. AVOID IBUPROFEN AND NAPROXEN.

## 2016-10-22 NOTE — Progress Notes (Signed)
ANTICOAGULATION CONSULT NOTE   Pharmacy Consult for heparin Indication: chest pain/ACS  No Known Allergies  Patient Measurements: Height: 5\' 4"  (162.6 cm) Weight: 167 lb 15.9 oz (76.2 kg) IBW/kg (Calculated) : 59.2 Heparin Dosing Weight: 74 kg  Vital Signs: Temp: 97.5 F (36.4 C) (07/07 1939) Temp Source: Oral (07/07 1939) BP: 104/89 (07/07 2200) Pulse Rate: 68 (07/07 2200)  Labs:  Recent Labs  10/20/16 1022  10/20/16 1640 10/20/16 1807 10/20/16 2220 10/21/16 0423 10/22/16 0224  HGB 12.1*  --   --   --   --  11.3* 11.3*  HCT 37.9*  --   --   --   --  36.7* 36.1*  PLT 142*  --   --   --   --  146* 145*  HEPARINUNFRC  --   --   --  0.37  --  0.64 0.86*  CREATININE 1.45*  --   --   --   --  1.37* 1.44*  TROPONINI 0.06*  < > 0.08*  --  0.07* 0.07*  --   < > = values in this interval not displayed.  Estimated Creatinine Clearance: 30.6 mL/min (A) (by C-G formula based on SCr of 1.44 mg/dL (H)).  Assessment: 83 yo man presents with CP and elevated troponin to start heparin therapy. He had BMS placed 2014 and was on plavix but this was dc'd June 26 due to duodenal ulcers and erosive gastritis.    Heparin level supratherapeutic at 0.86. CBC low-stable. No bleeding per RN.   Goal of Therapy:  Heparin level 0.3-0.7 units/ml Monitor platelets by anticoagulation protocol: Yes   Plan:  Decrease heparin to 800 units/hr Heparin level in 8 hours  Check heparin level daily while on heparin Monitor for bleeding complications  Argie Ramming, PharmD Clinical Pharmacist 10/22/16 3:24 AM

## 2016-10-22 NOTE — Progress Notes (Signed)
[place] for heparin Indication: chest pain/ACS  No Known Allergies  Patient Measurements: Height: 5\' 4"  (162.6 cm) Weight: 169 lb 8.5 oz (76.9 kg) IBW/kg (Calculated) : 59.2 Heparin Dosing Weight: 74 kg  Vital Signs: Temp: 98.3 F (36.8 C) (07/08 1100) Temp Source: Oral (07/08 1100) BP: 130/57 (07/08 1000) Pulse Rate: 74 (07/08 1000)  Labs:  Recent Labs  10/20/16 1022  10/20/16 1640  10/20/16 2220 10/21/16 0423 10/22/16 0224 10/22/16 1049  HGB 12.1*  --   --   --   --  11.3* 11.3*  --   HCT 37.9*  --   --   --   --  36.7* 36.1*  --   PLT 142*  --   --   --   --  146* 145*  --   HEPARINUNFRC  --   --   --   < >  --  0.64 0.86* 0.67  CREATININE 1.45*  --   --   --   --  1.37* 1.44*  --   TROPONINI 0.06*  < > 0.08*  --  0.07* 0.07*  --   --   < > = values in this interval not displayed.  Estimated Creatinine Clearance: 30.7 mL/min (A) (by C-G formula based on SCr of 1.44 mg/dL (H)).  Assessment: 83 yo man presents with CP and elevated troponin to start heparin therapy. He had BMS placed 2014 and was on plavix but this was dc'd June 26 due to duodenal ulcers and erosive gastritis.    Heparin level at upper end of goal at 0.67. CBC low-stable. No bleeding per RN. Cath in am  Goal of Therapy:  Heparin level 0.3-0.7 units/ml Monitor platelets by anticoagulation protocol: Yes   Plan:  Decrease heparin to 700 units/hr Check heparin level daily while on heparin Monitor for bleeding complications  Erin Hearing PharmD., BCPS Clinical Pharmacist Pager [phone] 10/22/2016 1:22 PM

## 2016-10-23 ENCOUNTER — Encounter (HOSPITAL_COMMUNITY): Payer: Self-pay | Admitting: Cardiology

## 2016-10-23 ENCOUNTER — Encounter (HOSPITAL_COMMUNITY)
Admission: EM | Disposition: A | Discharge status: Home / Self Care | Payer: Self-pay | Source: Home / Self Care | Attending: Cardiovascular Disease

## 2016-10-23 DIAGNOSIS — K264 Chronic or unspecified duodenal ulcer with hemorrhage: Secondary | ICD-10-CM

## 2016-10-23 DIAGNOSIS — E785 Hyperlipidemia, unspecified: Secondary | ICD-10-CM

## 2016-10-23 DIAGNOSIS — I2510 Atherosclerotic heart disease of native coronary artery without angina pectoris: Secondary | ICD-10-CM

## 2016-10-23 DIAGNOSIS — Z9861 Coronary angioplasty status: Secondary | ICD-10-CM

## 2016-10-23 DIAGNOSIS — I25110 Atherosclerotic heart disease of native coronary artery with unstable angina pectoris: Principal | ICD-10-CM

## 2016-10-23 DIAGNOSIS — N183 Chronic kidney disease, stage 3 (moderate): Secondary | ICD-10-CM

## 2016-10-23 DIAGNOSIS — I10 Essential (primary) hypertension: Secondary | ICD-10-CM

## 2016-10-23 HISTORY — PX: LEFT HEART CATH AND CORONARY ANGIOGRAPHY: CATH118249

## 2016-10-23 LAB — BASIC METABOLIC PANEL
Anion gap: 6 (ref 5–15)
BUN: 16 mg/dL (ref 6–20)
CHLORIDE: 101 mmol/L (ref 101–111)
CO2: 29 mmol/L (ref 22–32)
Calcium: 8.3 mg/dL — ABNORMAL LOW (ref 8.9–10.3)
Creatinine, Ser: 1.52 mg/dL — ABNORMAL HIGH (ref 0.61–1.24)
GFR calc Af Amer: 44 mL/min — ABNORMAL LOW (ref >60)
GFR calc non Af Amer: 38 mL/min — ABNORMAL LOW (ref >60)
Glucose, Bld: 102 mg/dL — ABNORMAL HIGH (ref 65–99)
Potassium: 4.1 mmol/L (ref 3.5–5.1)
Sodium: 136 mmol/L (ref 135–145)

## 2016-10-23 LAB — CBC
HEMATOCRIT: 32 % — AB (ref 39.0–52.0)
Hemoglobin: 10 g/dL — ABNORMAL LOW (ref 13.0–17.0)
MCH: 29 pg (ref 26.0–34.0)
MCHC: 31.3 g/dL (ref 30.0–36.0)
MCV: 92.8 fL (ref 78.0–100.0)
Platelets: 143 10*3/uL — ABNORMAL LOW (ref 150–400)
RBC: 3.45 MIL/uL — ABNORMAL LOW (ref 4.22–5.81)
RDW: 12 % (ref 11.5–15.5)
WBC: 9.5 10*3/uL (ref 4.0–10.5)

## 2016-10-23 LAB — PROTIME-INR
INR: 1.01
Prothrombin Time: 13.3 seconds (ref 11.4–15.2)

## 2016-10-23 LAB — HEPARIN LEVEL (UNFRACTIONATED): Heparin Unfractionated: 0.33 IU/mL (ref 0.30–0.70)

## 2016-10-23 SURGERY — LEFT HEART CATH AND CORONARY ANGIOGRAPHY
Anesthesia: LOCAL

## 2016-10-23 MED ORDER — FENTANYL CITRATE (PF) 100 MCG/2ML IJ SOLN
INTRAMUSCULAR | Status: AC
  Filled 2016-10-23: qty 2

## 2016-10-23 MED ORDER — HEPARIN SODIUM (PORCINE) 1000 UNIT/ML IJ SOLN
INTRAMUSCULAR | Status: AC
  Filled 2016-10-23: qty 1

## 2016-10-23 MED ORDER — LIDOCAINE HCL (PF) 1 % IJ SOLN
INTRAMUSCULAR | Status: DC | PRN
  Administered 2016-10-23: 2 mL

## 2016-10-23 MED ORDER — FENTANYL CITRATE (PF) 100 MCG/2ML IJ SOLN
INTRAMUSCULAR | Status: DC | PRN
  Administered 2016-10-23: 25 ug via INTRAVENOUS

## 2016-10-23 MED ORDER — SIMETHICONE 40 MG/0.6ML PO SUSP
40.0000 mg | Freq: Four times a day (QID) | ORAL | Status: DC | PRN
  Administered 2016-10-23: 40 mg via ORAL
  Filled 2016-10-23 (×2): qty 0.6

## 2016-10-23 MED ORDER — VERAPAMIL HCL 2.5 MG/ML IV SOLN
INTRAVENOUS | Status: AC
  Filled 2016-10-23: qty 2

## 2016-10-23 MED ORDER — HEPARIN SODIUM (PORCINE) 1000 UNIT/ML IJ SOLN
INTRAMUSCULAR | Status: DC | PRN
  Administered 2016-10-23: 5000 [IU] via INTRAVENOUS

## 2016-10-23 MED ORDER — MIDAZOLAM HCL 2 MG/2ML IJ SOLN
INTRAMUSCULAR | Status: AC
  Filled 2016-10-23: qty 2

## 2016-10-23 MED ORDER — SODIUM CHLORIDE 0.9 % WEIGHT BASED INFUSION
1.0000 mL/kg/h | INTRAVENOUS | Status: AC
  Administered 2016-10-23: 1 mL/kg/h via INTRAVENOUS

## 2016-10-23 MED ORDER — SODIUM CHLORIDE 0.9 % IV SOLN: 250.0000 mL | INTRAVENOUS | Status: DC | PRN

## 2016-10-23 MED ORDER — MIDAZOLAM HCL 2 MG/2ML IJ SOLN
INTRAMUSCULAR | Status: DC | PRN
  Administered 2016-10-23: 1 mg via INTRAVENOUS

## 2016-10-23 MED ORDER — IOPAMIDOL (ISOVUE-370) INJECTION 76%
INTRAVENOUS | Status: DC | PRN
  Administered 2016-10-23: 40 mL via INTRA_ARTERIAL

## 2016-10-23 MED ORDER — LIDOCAINE HCL 1 % IJ SOLN
INTRAMUSCULAR | Status: AC
  Filled 2016-10-23: qty 20

## 2016-10-23 MED ORDER — HEPARIN (PORCINE) IN NACL 2-0.9 UNIT/ML-% IJ SOLN
INTRAMUSCULAR | Status: DC | PRN
  Administered 2016-10-23: 10 mL via INTRA_ARTERIAL

## 2016-10-23 MED ORDER — HEPARIN (PORCINE) IN NACL 2-0.9 UNIT/ML-% IJ SOLN
INTRAMUSCULAR | Status: AC | PRN
Start: 2016-10-23 — End: 2016-10-23
  Administered 2016-10-23: 1000 mL

## 2016-10-23 MED ORDER — HEPARIN (PORCINE) IN NACL 2-0.9 UNIT/ML-% IJ SOLN
INTRAMUSCULAR | Status: AC
  Filled 2016-10-23: qty 1000

## 2016-10-23 MED ORDER — SODIUM CHLORIDE 0.9% FLUSH: 3.0000 mL | INTRAVENOUS | Status: DC | PRN

## 2016-10-23 MED ORDER — IOPAMIDOL (ISOVUE-370) INJECTION 76%
INTRAVENOUS | Status: AC
  Filled 2016-10-23: qty 100

## 2016-10-23 MED ORDER — SODIUM CHLORIDE 0.9% FLUSH
3.0000 mL | Freq: Two times a day (BID) | INTRAVENOUS | Status: DC
  Administered 2016-10-24: 06:00:00 3 mL via INTRAVENOUS

## 2016-10-23 SURGICAL SUPPLY — 10 items
CATH INFINITI 5 FR JL3.5 (CATHETERS) ×1 IMPLANT
CATH INFINITI JR4 5F (CATHETERS) ×1 IMPLANT
DEVICE RAD COMP TR BAND LRG (VASCULAR PRODUCTS) ×1 IMPLANT
GLIDESHEATH SLEND SS 6F .021 (SHEATH) ×1 IMPLANT
GUIDEWIRE INQWIRE 1.5J.035X260 (WIRE) IMPLANT
INQWIRE 1.5J .035X260CM (WIRE) ×2
KIT HEART LEFT (KITS) ×2 IMPLANT
PACK CARDIAC CATHETERIZATION (CUSTOM PROCEDURE TRAY) ×2 IMPLANT
TRANSDUCER W/STOPCOCK (MISCELLANEOUS) ×2 IMPLANT
TUBING CIL FLEX 10 FLL-RA (TUBING) ×2 IMPLANT

## 2016-10-23 NOTE — Telephone Encounter (Signed)
Tried to call with no answer  

## 2016-10-23 NOTE — Interval H&P Note (Signed)
Cath Lab Visit (complete for each Cath Lab visit)  Clinical Evaluation Leading to the Procedure:   ACS: Yes.    Non-ACS:    Anginal Classification: CCS IV  Anti-ischemic medical therapy: Minimal Therapy (1 class of medications)  Non-Invasive Test Results: No non-invasive testing performed  Prior CABG: No previous CABG      History and Physical Interval Note:  10/23/2016 4:46 PM  Anthony Galvan.  has presented today for surgery, with the diagnosis of unstable angina  The various methods of treatment have been discussed with the patient and family. After consideration of risks, benefits and other options for treatment, the patient has consented to  Procedure(s): Left Heart Cath and Coronary Angiography (N/A) as a surgical intervention .  The patient's history has been reviewed, patient examined, no change in status, stable for surgery.  I have reviewed the patient's chart and labs.  Questions were answered to the patient's satisfaction.     Sherren Mocha

## 2016-10-23 NOTE — Progress Notes (Addendum)
Progress Note  Patient Name: Anthony Galvan. Date of Encounter: 10/23/2016  Primary Cardiologist:    Dr. Ellyn Hack - last seen May 2018.  Patient Profile     83 y.o. male with past medical history of CAD s/p BMS to LAD 2014, hypertension, hyperlipidemia and GI bleed in the past. He presented to the [facility] emergency department with complaints of increasing chest pressure and chest tightness with difficulty breathing intermittently.   He was lost to follow-up after May 2015  Subjective   No further anginal pain. No dyspnea Has not had a BM in a couple days, but feels like he may need to know. Has not noted any blood in stools.   Inpatient Medications    Scheduled Meds: . amLODipine  5 mg Oral Daily  . aspirin EC  81 mg Oral Daily  . atorvastatin  80 mg Oral q1800  . donepezil  5 mg Oral Daily  . isosorbide mononitrate  60 mg Oral Daily  . loratadine  10 mg Oral Daily  . metoprolol tartrate  50 mg Oral BID  . pantoprazole  40 mg Oral BID AC  . sodium chloride flush  3 mL Intravenous Q12H   Continuous Infusions: . sodium chloride    . sodium chloride 1 mL/kg/hr (10/23/16 0114)  . heparin 700 Units/hr (10/22/16 1608)   PRN Meds: sodium chloride, acetaminophen, morphine injection, nitroGLYCERIN, ondansetron (ZOFRAN) IV, sodium chloride flush   Vital Signs    Vitals:   10/23/16 0600 10/23/16 0700 10/23/16 0800 10/23/16 0811  BP: 126/60 117/60 (!) 113/56   Pulse: (!) 57 60 60   Resp:      Temp:    97.8 F (36.6 C)  TempSrc:    Oral  SpO2: 100% 100% 100%   Weight:      Height:        Intake/Output Summary (Last 24 hours) at 10/23/16 0813 Last data filed at 10/23/16 0800  Gross per 24 hour  Intake          1810.22 ml  Output              950 ml  Net           860.22 ml   Filed Weights   10/21/16 0500 10/22/16 0400 10/23/16 0500  Weight: 167 lb 15.9 oz (76.2 kg) 169 lb 8.5 oz (76.9 kg) 170 lb 3.1 oz (77.2 kg)    Telemetry    NSR 60s (lots of artifact)-  Personally Reviewed  ECG     From 7/8: NSR 64, LAD (-35), mild LVH, biphasic T-wave inversions in inferior leads (I, II, aVF)  - Personally Reviewed  Physical Exam   Physical Exam  Constitutional: He is oriented to person, place, and time. He appears well-developed and well-nourished. No distress.  Appears stated age  HENT:  Head: Normocephalic and atraumatic.  Eyes: Pupils are equal, round, and reactive to light.  Neck: Normal range of motion. Neck supple. No JVD present.  Cardiovascular: Normal rate, regular rhythm, S1 normal, S2 normal and intact distal pulses.  PMI is not displaced.  Exam reveals no gallop and no friction rub.   Murmur heard.  Low-pitched harsh crescendo-decrescendo early systolic murmur is present with a grade of 1/6  at the upper right sternal border Pulmonary/Chest: Effort normal. No respiratory distress. He has no wheezes. He has rales (Mild bibasal, clears with cough). He exhibits no tenderness.  Abdominal: Soft. Bowel sounds are normal. He exhibits no distension.  There is no tenderness. There is no rebound.  Musculoskeletal: Normal range of motion. He exhibits no edema.  Neurological: He is alert and oriented to person, place, and time. No cranial nerve deficit.  Skin: Skin is warm and dry. He is not diaphoretic.  Psychiatric: He has a normal mood and affect.    Labs    Chemistry  Recent Labs Lab 10/21/16 0423 10/22/16 0224 10/23/16 0156  NA 135 136 136  K 4.4 4.5 4.1  CL 101 101 101  CO2 27 27 29   GLUCOSE 100* 96 102*  BUN 15 13 16   CREATININE 1.37* 1.44* 1.52*  CALCIUM 8.5* 8.6* 8.3*  GFRNONAA 43* 41* 38*  GFRAA 50* 47* 44*  ANIONGAP 7 8 6      Hematology  Recent Labs Lab 10/21/16 0423 10/22/16 0224 10/23/16 0156  WBC 14.3* 10.7* 9.5  RBC 3.97* 3.89* 3.45*  HGB 11.3* 11.3* 10.0*  HCT 36.7* 36.1* 32.0*  MCV 92.4 92.8 92.8  MCH 28.5 29.0 29.0  MCHC 30.8 31.3 31.3  RDW 12.1 12.2 12.0  PLT 146* 145* 143*    Cardiac  Enzymes  Recent Labs Lab 10/20/16 1322 10/20/16 1640 10/20/16 2220 10/21/16 0423  TROPONINI 0.07* 0.08* 0.07* 0.07*   No results for input(s): TROPIPOC in the last 168 hours.   BNPNo results for input(s): BNP, PROBNP in the last 168 hours.   DDimer No results for input(s): DDIMER in the last 168 hours.   Radiology    No results found.  Cardiac Studies    ECHO  10/21/16: Normal LV size & Fxn.  EF 60-65% - no RWMA. Mild LVH, GR 1 DD. Mod Ao Sclerosis. Mild AI, MR. Mild RA & TV dilation. Mod TR - ~PAP ~60 mmHg  Assessment & Plan    Principal Problem:   Unstable angina (HCC) Active Problems:   CAD S/P percutaneous coronary angioplasty   Hyperlipidemia with target low density lipoprotein (LDL) cholesterol less than 70 mg/dL   Essential hypertension   GERD   Duodenal ulcer with hemorrhage   GI bleed   CKD (chronic kidney disease) stage 3, GFR 30-59 ml/min  Principal Problem:   Unstable angina ([place]) with h/o  CAD S/P percutaneous coronary angioplasty  Per initial evaluation by Dr. Sallyanne Kuster, plan was cardiac catheterization today.  Continues to be on IV heparin and aspirin.  Hydrating precath -> monitor for signs of volume overloaded disease now having some mild bibasal  Given recent GI bleed issues, I agree with bare-metal stent and Plavix.  Imdur, calcium blocker and beta blocker  Statin    Hyperlipidemia with target low density lipoprotein (LDL) cholesterol less than 70 mg/dL - on high-dose statin currently. We'll need to reassess lipids following discharge    Essential hypertension - stable blood pressure on amlodipine, Imdur and metoprolol.    GERD/duodenal ulcer - with GI bleed: Active bleed with duodenal ulcers last month. Hemoglobin stable. Prefer bare-metal stent.  H&H stable  Continue PPI     CKD (chronic kidney disease) stage 3, GFR 30-59 ml/min -   His creatinine is trending up this morning. I will continue to hydrate and consider later afternoon  procedure    Plan cardiac catheterization today with BMS if necessary. We'll need to hydrate. - Incentive spirometer, he will need to be ambulatory prior to discharge.   Signed, Glenetta Hew, MD  10/23/2016, 8:13 AM

## 2016-10-23 NOTE — Care Management Note (Signed)
Case Management Note Anthony Gibbons RN, BSN Unit 2W-Case Manager- [place] coverage [phone]  Patient Details  Name: Anthony Galvan. MRN: [id number] Date of Birth: 03/27/1922  Subjective/Objective:  Pt admitted with [place]- plan for cardiac cath 7/9                Action/Plan: PTA pt lived at home with grandson, independent with ADLs, has cane, RW and scooter at home- pt has PCP, transportation and drug coverage.  CM to follow for d/c needs  Expected Discharge Date:                  Expected Discharge Plan:  Home/Self Care  In-House Referral:  NA  Discharge planning Services  CM Consult  Post Acute Care Choice:    Choice offered to:     DME Arranged:    DME Agency:     HH Arranged:    HH Agency:     Status of Service:  In process, will continue to follow  If discussed at Long Length of Stay Meetings, dates discussed:    Discharge Disposition:   Additional Comments:  Dawayne Patricia, RN 10/23/2016, 10:27 AM

## 2016-10-23 NOTE — Telephone Encounter (Signed)
Pt's daughter is aware.She stated that he is in [facility] now dur to a heart attack

## 2016-10-23 NOTE — Progress Notes (Signed)
This RN called by Linzie Collin. Ms. Henricksen states that the Gastroenterologist had called her and  she " wants to let us know that he cannot have Ibuprofen or NSAIDS." I reassured her that per Trinda Pascal, MD note, pt will not receive those medications. (see MD note 10/23/2016)    Pt had BM today with NO s/sx of bleeding.  Will continue to monitor closely.  Lucius Conn, RN

## 2016-10-23 NOTE — Progress Notes (Signed)
Pt transferred to the cath lab. VSS.  Lucius Conn, RN

## 2016-10-23 NOTE — H&P (View-Only) (Signed)
Progress Note  Patient Name: Anthony Galvan. Date of Encounter: 10/23/2016  Primary Cardiologist:    Dr. Ellyn Hack - last seen May 2018.  Patient Profile     83 y.o. male with past medical history of CAD s/p BMS to LAD 2014, hypertension, hyperlipidemia and GI bleed in the past. He presented to the [facility] emergency department with complaints of increasing chest pressure and chest tightness with difficulty breathing intermittently.   He was lost to follow-up after May 2018  Subjective   No further anginal pain. No dyspnea Has not had a BM in a couple days, but feels like he may need to know. Has not noted any blood in stools.   Inpatient Medications    Scheduled Meds: . amLODipine  5 mg Oral Daily  . aspirin EC  81 mg Oral Daily  . atorvastatin  80 mg Oral q1800  . donepezil  5 mg Oral Daily  . isosorbide mononitrate  60 mg Oral Daily  . loratadine  10 mg Oral Daily  . metoprolol tartrate  50 mg Oral BID  . pantoprazole  40 mg Oral BID AC  . sodium chloride flush  3 mL Intravenous Q12H   Continuous Infusions: . sodium chloride    . sodium chloride 1 mL/kg/hr (10/23/16 0114)  . heparin 700 Units/hr (10/22/16 1608)   PRN Meds: sodium chloride, acetaminophen, morphine injection, nitroGLYCERIN, ondansetron (ZOFRAN) IV, sodium chloride flush   Vital Signs    Vitals:   10/23/16 0600 10/23/16 0700 10/23/16 0800 10/23/16 0811  BP: 126/60 117/60 (!) 113/56   Pulse: (!) 57 60 60   Resp:      Temp:    97.8 F (36.6 C)  TempSrc:    Oral  SpO2: 100% 100% 100%   Weight:      Height:        Intake/Output Summary (Last 24 hours) at 10/23/16 0813 Last data filed at 10/23/16 0800  Gross per 24 hour  Intake          1810.22 ml  Output              950 ml  Net           860.22 ml   Filed Weights   10/21/16 0500 10/22/16 0400 10/23/16 0500  Weight: 167 lb 15.9 oz (76.2 kg) 169 lb 8.5 oz (76.9 kg) 170 lb 3.1 oz (77.2 kg)    Telemetry    NSR 60s (lots of artifact)-  Personally Reviewed  ECG     From 7/8: NSR 64, LAD (-35), mild LVH, biphasic T-wave inversions in inferior leads (I, II, aVF)  - Personally Reviewed  Physical Exam   Physical Exam  Constitutional: He is oriented to person, place, and time. He appears well-developed and well-nourished. No distress.  Appears stated age  HENT:  Head: Normocephalic and atraumatic.  Eyes: Pupils are equal, round, and reactive to light.  Neck: Normal range of motion. Neck supple. No JVD present.  Cardiovascular: Normal rate, regular rhythm, S1 normal, S2 normal and intact distal pulses.  PMI is not displaced.  Exam reveals no gallop and no friction rub.   Murmur heard.  Low-pitched harsh crescendo-decrescendo early systolic murmur is present with a grade of 1/6  at the upper right sternal border Pulmonary/Chest: Effort normal. No respiratory distress. He has no wheezes. He has rales (Mild bibasal, clears with cough). He exhibits no tenderness.  Abdominal: Soft. Bowel sounds are normal. He exhibits no distension.  There is no tenderness. There is no rebound.  Musculoskeletal: Normal range of motion. He exhibits no edema.  Neurological: He is alert and oriented to person, place, and time. No cranial nerve deficit.  Skin: Skin is warm and dry. He is not diaphoretic.  Psychiatric: He has a normal mood and affect.    Labs    Chemistry  Recent Labs Lab 10/21/16 0423 10/22/16 0224 10/23/16 0156  NA 135 136 136  K 4.4 4.5 4.1  CL 101 101 101  CO2 27 27 29   GLUCOSE 100* 96 102*  BUN 15 13 16   CREATININE 1.37* 1.44* 1.52*  CALCIUM 8.5* 8.6* 8.3*  GFRNONAA 43* 41* 38*  GFRAA 50* 47* 44*  ANIONGAP 7 8 6      Hematology  Recent Labs Lab 10/21/16 0423 10/22/16 0224 10/23/16 0156  WBC 14.3* 10.7* 9.5  RBC 3.97* 3.89* 3.45*  HGB 11.3* 11.3* 10.0*  HCT 36.7* 36.1* 32.0*  MCV 92.4 92.8 92.8  MCH 28.5 29.0 29.0  MCHC 30.8 31.3 31.3  RDW 12.1 12.2 12.0  PLT 146* 145* 143*    Cardiac  Enzymes  Recent Labs Lab 10/20/16 1322 10/20/16 1640 10/20/16 2220 10/21/16 0423  TROPONINI 0.07* 0.08* 0.07* 0.07*   No results for input(s): TROPIPOC in the last 168 hours.   BNPNo results for input(s): BNP, PROBNP in the last 168 hours.   DDimer No results for input(s): DDIMER in the last 168 hours.   Radiology    No results found.  Cardiac Studies    ECHO  10/21/16: Normal LV size & Fxn.  EF 60-65% - no RWMA. Mild LVH, GR 1 DD. Mod Ao Sclerosis. Mild AI, MR. Mild RA & TV dilation. Mod TR - ~PAP ~60 mmHg  Assessment & Plan    Principal Problem:   Unstable angina (HCC) Active Problems:   CAD S/P percutaneous coronary angioplasty   Hyperlipidemia with target low density lipoprotein (LDL) cholesterol less than 70 mg/dL   Essential hypertension   GERD   Duodenal ulcer with hemorrhage   GI bleed   CKD (chronic kidney disease) stage 3, GFR 30-59 ml/min  Principal Problem:   Unstable angina ([place]) with h/o  CAD S/P percutaneous coronary angioplasty  Per initial evaluation by Dr. Sallyanne Kuster, plan was cardiac catheterization today.  Continues to be on IV heparin and aspirin.  Hydrating precath -> monitor for signs of volume overloaded disease now having some mild bibasal  Given recent GI bleed issues, I agree with bare-metal stent and Plavix.  Imdur, calcium blocker and beta blocker  Statin    Hyperlipidemia with target low density lipoprotein (LDL) cholesterol less than 70 mg/dL - on high-dose statin currently. We'll need to reassess lipids following discharge    Essential hypertension - stable blood pressure on amlodipine, Imdur and metoprolol.    GERD/duodenal ulcer - with GI bleed: Active bleed with duodenal ulcers last month. Hemoglobin stable. Prefer bare-metal stent.  H&H stable  Continue PPI     CKD (chronic kidney disease) stage 3, GFR 30-59 ml/min -   His creatinine is trending up this morning. I will continue to hydrate and consider later afternoon  procedure    Plan cardiac catheterization today with BMS if necessary. We'll need to hydrate. - Incentive spirometer, he will need to be ambulatory prior to discharge.   Signed, Glenetta Hew, MD  10/23/2016, 8:13 AM

## 2016-10-23 NOTE — Progress Notes (Signed)
[place] for heparin Indication: chest pain/ACS  No Known Allergies  Patient Measurements: Height: 5\' 4"  (162.6 cm) Weight: 170 lb 3.1 oz (77.2 kg) IBW/kg (Calculated) : 59.2 Heparin Dosing Weight: 74 kg  Vital Signs: Temp: 97.8 F (36.6 C) (07/09 0811) Temp Source: Oral (07/09 0811) BP: 113/56 (07/09 0800) Pulse Rate: 60 (07/09 0800)  Labs:  Recent Labs  10/20/16 1640  10/20/16 2220 10/21/16 0423 10/22/16 0224 10/22/16 1049 10/23/16 0156  HGB  --   --   --  11.3* 11.3*  --  10.0*  HCT  --   --   --  36.7* 36.1*  --  32.0*  PLT  --   --   --  146* 145*  --  143*  LABPROT  --   --   --   --   --   --  13.3  INR  --   --   --   --   --   --  1.01  HEPARINUNFRC  --   < >  --  0.64 0.86* 0.67 0.33  CREATININE  --   --   --  1.37* 1.44*  --  1.52*  TROPONINI 0.08*  --  0.07* 0.07*  --   --   --   < > = values in this interval not displayed.  Estimated Creatinine Clearance: 29.1 mL/min (A) (by C-G formula based on SCr of 1.52 mg/dL (H)).  Assessment: 83 yo man presents with CP and elevated troponin to start heparin therapy. He had BMS placed 2014 and was on plavix but this was dc'd June 26 due to duodenal ulcers and erosive gastritis.  Plans for cath today -heparin level is 0.33 and at goal  Goal of Therapy:  Heparin level 0.3-0.7 units/ml Monitor platelets by anticoagulation protocol: Yes   Plan:  -No heparin changes needed -Will follow plans post cath  Hildred Laser, Pharm D 10/23/2016 8:42 AM

## 2016-10-24 ENCOUNTER — Telehealth: Payer: Self-pay

## 2016-10-24 ENCOUNTER — Inpatient Hospital Stay (HOSPITAL_COMMUNITY): Payer: Medicare Other

## 2016-10-24 DIAGNOSIS — R748 Abnormal levels of other serum enzymes: Secondary | ICD-10-CM

## 2016-10-24 LAB — BASIC METABOLIC PANEL
Anion gap: 6 (ref 5–15)
BUN: 14 mg/dL (ref 6–20)
CHLORIDE: 103 mmol/L (ref 101–111)
CO2: 29 mmol/L (ref 22–32)
CREATININE: 1.25 mg/dL — AB (ref 0.61–1.24)
Calcium: 8.3 mg/dL — ABNORMAL LOW (ref 8.9–10.3)
GFR calc Af Amer: 56 mL/min — ABNORMAL LOW (ref >60)
GFR calc non Af Amer: 48 mL/min — ABNORMAL LOW (ref >60)
Glucose, Bld: 111 mg/dL — ABNORMAL HIGH (ref 65–99)
POTASSIUM: 4.5 mmol/L (ref 3.5–5.1)
Sodium: 138 mmol/L (ref 135–145)

## 2016-10-24 LAB — CBC
HCT: 33.4 % — ABNORMAL LOW (ref 39.0–52.0)
Hemoglobin: 10.1 g/dL — ABNORMAL LOW (ref 13.0–17.0)
MCH: 28.3 pg (ref 26.0–34.0)
MCHC: 30.2 g/dL (ref 30.0–36.0)
MCV: 93.6 fL (ref 78.0–100.0)
PLATELETS: 151 10*3/uL (ref 150–400)
RBC: 3.57 MIL/uL — ABNORMAL LOW (ref 4.22–5.81)
RDW: 11.9 % (ref 11.5–15.5)
WBC: 8.6 10*3/uL (ref 4.0–10.5)

## 2016-10-24 MED ORDER — ASPIRIN 81 MG PO TBEC: 81.0000 mg | DELAYED_RELEASE_TABLET | Freq: Every day | ORAL | Status: DC

## 2016-10-24 MED ORDER — ACTIVE PARTNERSHIP FOR HEALTH OF YOUR HEART BOOK
Freq: Once | Status: AC
Start: 2016-10-24 — End: 2016-10-24
  Administered 2016-10-24: 06:00:00
  Filled 2016-10-24: qty 1

## 2016-10-24 MED ORDER — ISOSORBIDE MONONITRATE ER 30 MG PO TB24: 60.0000 mg | ORAL_TABLET | Freq: Every day | ORAL | 2 refills | Status: DC

## 2016-10-24 MED ORDER — AMLODIPINE BESYLATE 5 MG PO TABS: 5.0000 mg | ORAL_TABLET | Freq: Every day | ORAL | 1 refills | Status: DC

## 2016-10-24 NOTE — Discharge Summary (Signed)
Discharge Summary    Patient ID: Anthony Galvan.,  MRN: [id number], DOB/AGE: Aug 24, 1921 83 y.o.  Admit date: 10/20/2016 Discharge date: 10/24/2016  Primary Care Provider: Iona Beard Primary Cardiologist: Ellyn Hack  Discharge Diagnoses    Principal Problem:   Unstable angina [facility] Active Problems:   Hyperlipidemia with target low density lipoprotein (LDL) cholesterol less than 70 mg/dL   Essential hypertension   GERD   Duodenal ulcer with hemorrhage   CAD S/P percutaneous coronary angioplasty   GI bleed   CKD (chronic kidney disease) stage 3, GFR 30-59 ml/min   Allergies No Known Allergies  Diagnostic Studies/Procedures   TTE: 10/21/16  Study Conclusions  - Left ventricle: The cavity size was normal. Wall thickness was   increased in a pattern of mild LVH. Systolic function was normal.   The estimated ejection fraction was in the range of 60% to 65%.   Wall motion was normal; there were no regional wall motion   abnormalities. Doppler parameters are consistent with abnormal   left ventricular relaxation (grade 1 diastolic dysfunction). - Aortic valve: Moderately calcified annulus. Trileaflet. There was   mild regurgitation. - Mitral valve: There was mild regurgitation. - Right ventricle: The cavity size was mildly dilated. - Right atrium: The atrium was mildly dilated. - Tricuspid valve: There was moderate regurgitation. - Pulmonary arteries: PA peak pressure: 60 mm Hg (S).   LHC: 10/23/16  Conclusion   1. Two-vessel coronary artery disease (left dominant) with patency of the stented segment in the proximal LAD and severe heavily calcified distal circumflex/left PDA stenosis unfavorable for PCI  2. Known normal LV function by noninvasive assessment  3. Normal LVEDP  Recommend: Medical therapy for CAD. The distal circumflex/PDA lesion is severely calcified with a distal vessel location that is unfavorable for PCI and I would be very hesitant to use  atherectomy in this distal location.   _____________   History of Present Illness     Anthony Galvan. is a 83 y.o. male with past medical history of CAD status post BMS to LAD 2014, hypertension, hyperlipidemia and GI bleed in the past. He presented to the [facility] emergency department with complaints of increasing chest pressure and chest tightness with difficulty breathing intermittently since yesterday. The patient was recently taken off Plavix on June 26 after he was found to have duodenal ulcers and erosive gastritis. The patient's chest pain worsened with exertion.   The day prior to admission the patient developed substernal chest pain that was sharp and also pressure-like in the central chest and radiating to the bilateral neck and down the left arm. This occurred while he was sitting watching television and was associated with shortness of breath. He took sublingual nitroglycerin which relieved his pain. The morning of admission his chest pain returned with shortness of breath and mild nausea as he was getting up and going to the bathroom to take a shower and prepare for the day. He was also weak that morning. The combination of these symptoms wanted him to report to the ED. The patient is cared for at the [place] in [place]. Prior to these episodes the patient was not having any exertional chest pain or dyspnea. He admits that he is not as active as he used to be as he has much fewer demands on him. He has never smoked or drank alcohol.  Patient had a recent hospital admission from 10/08/16 -10/10/16 for GI bleed and EGD noting duodenal ulcers, moderate  erosive gastritis/duodenitis and recommendations to avoid all NSAIDs indefinitely due to history of PUD. He was also discharged on Protonix twice a day for the next 3 months.  The patient was last seen by Dr. Ellyn Hack in 08/2013 at which time the patient was doing very well status post bare metal stent to the LAD and it was advised to stop aspirin  but continue Plavix along with statin, ARB and beta blocker.  Echocardiogram done at [facility] in 03/2016 showed normal LV function  Cardiac catheterization 08/01/12 by Dr. Ellyn Hack: Severe single vessel CAD - 95-99% focal mid LAD lesion in a Left Dominant system. Successful PCI of mid LAD with a VeriFlex BMS 3.0 mm x 12 mm - post-dilated to 3.5 mm. BMS used due to history of PUD and Melana. Normal LVEDP. RCA with diffuse mild to moderate luminal irregularities, proximal ~60-70%. L PDA with ~50% tubular stenosis  Cardiac catheterization in 01/2007 by Dr. Gwenlyn Found: Noncritical coronary artery disease, LAD with 75% ostial lesion in the takeoff of first small to moderate size diagonal branch. 50% mid LAD lesion. Left circumflex that is dominant with 50% and 70% lesions in the PDA. RCA was nondominant with a 75% proximal stenosis. Normal EF. Given symptoms he was admitted with plans to undergo repeat coronary angiography.   Hospital Course     Trop with low flat trend of 0.07>>0.08. Echo showed normal EF with G1 DD. He was prepped for cath Monday. H/H remained stable throughout admission. He was hydrated without signs of volume overload. Underwent LHC with Dr. Burt Knack noted above with difficult distal LCx disease felt to be the culprit. It was felt that medical therapy was the best option. Post cath labs showed Cr 1.25, stable around baseline, and Hgb 10.1. He was continued on BB at home dose with Imdur increased to 60mg  daily. His home hydralazine was held, and amlodipine 5mg  daily added for antianginal effects and blood pressure control. No further chest pain noted. Did report a cough, but CXR was negative for acute findings. Able to ambulate in the hallway without any chest pain.   He was seen by Dr. Ellyn Hack and determined stable for discharge home. Follow up in the office has been arranged. Medications are listed below.  _____________  Discharge Vitals Blood pressure (!) 129/48, pulse 69, temperature 98.4 F  (36.9 C), temperature source Oral, resp. rate 18, height 5\' 4"  (1.626 m), weight 171 lb 11.8 oz (77.9 kg), SpO2 93 %.  Filed Weights   10/22/16 0400 10/23/16 0500 10/24/16 0147  Weight: 169 lb 8.5 oz (76.9 kg) 170 lb 3.1 oz (77.2 kg) 171 lb 11.8 oz (77.9 kg)    Labs & Radiologic Studies    CBC  Recent Labs  10/23/16 0156 10/24/16 0227  WBC 9.5 8.6  HGB 10.0* 10.1*  HCT 32.0* 33.4*  MCV 92.8 93.6  PLT 143* 623   Basic Metabolic Panel  Recent Labs  10/23/16 0156 10/24/16 0227  NA 136 138  K 4.1 4.5  CL 101 103  CO2 29 29  GLUCOSE 102* 111*  BUN 16 14  CREATININE 1.52* 1.25*  CALCIUM 8.3* 8.3*   Liver Function Tests No results for input(s): AST, ALT, ALKPHOS, BILITOT, PROT, ALBUMIN in the last 72 hours. No results for input(s): LIPASE, AMYLASE in the last 72 hours. Cardiac Enzymes No results for input(s): CKTOTAL, CKMB, CKMBINDEX, TROPONINI in the last 72 hours. BNP Invalid input(s): POCBNP D-Dimer No results for input(s): DDIMER in the last 72 hours. Hemoglobin A1C No  results for input(s): HGBA1C in the last 72 hours. Fasting Lipid Panel No results for input(s): CHOL, HDL, LDLCALC, TRIG, CHOLHDL, LDLDIRECT in the last 72 hours. Thyroid Function Tests No results for input(s): TSH, T4TOTAL, T3FREE, THYROIDAB in the last 72 hours.  Invalid input(s): FREET3 _____________  Dg Chest 2 View  Result Date: 10/20/2016 CLINICAL DATA:  Chest pain EXAM: CHEST  2 VIEW COMPARISON:  10/08/2016 FINDINGS: Scarring noted in the lung bases. Heart is mildly enlarged. No acute airspace opacities or effusions. No acute bony abnormality. IMPRESSION: Bibasilar scarring.  Mild cardiomegaly. Electronically Signed   By: Rolm Baptise M.D.   On: 10/20/2016 11:09   Dg Chest Port 1 View  Result Date: 10/24/2016 CLINICAL DATA:  Cough, abdominal bloating EXAM: PORTABLE CHEST 1 VIEW COMPARISON:  10/20/2016 FINDINGS: Cardiomegaly with vascular congestion. Bibasilar atelectasis. No overt edema or  effusions. No acute bony abnormality. IMPRESSION: Cardiomegaly, vascular congestion and bibasilar atelectasis. Electronically Signed   By: Rolm Baptise M.D.   On: 10/24/2016 09:38   Dg Abdomen Acute W/chest  Result Date: 10/08/2016 CLINICAL DATA:  Abdominal pain EXAM: DG ABDOMEN ACUTE W/ 1V CHEST COMPARISON:  08/07/2015 FINDINGS: Cardiac shadow is at the upper limits of normal in size and stable. The lungs are well aerated bilaterally. Mild chronic interstitial changes are again seen. No free air is seen. Scattered large and small bowel gas is noted. No obstructive changes are seen. Degenerative changes of the thoracolumbar spine are noted. IMPRESSION: No acute abnormality noted. Electronically Signed   By: Inez Catalina M.D.   On: 10/08/2016 18:09   Disposition   Pt is being discharged home today in good condition.  Follow-up Plans & Appointments    Follow-up Information    Iona Beard, MD Follow up.   Specialty:  Family Medicine Contact information: [address] [address] [phone]        Almyra Deforest, [place] Follow up on 11/08/2016.   Specialties:  Cardiology, Radiology Why:  at [place] for your follow up appt.  Contact information: [address] [place] [address] [phone]          Discharge Instructions    Call MD for:  redness, tenderness, or signs of infection (pain, swelling, redness, odor or green/yellow discharge around incision site)    Complete by:  As directed    Diet - low sodium heart healthy    Complete by:  As directed    Discharge instructions    Complete by:  As directed    Radial Site Care Refer to this sheet in the next few weeks. These instructions provide you with information on caring for yourself after your procedure. Your caregiver may also give you more specific instructions. Your treatment has been planned according to current medical practices, but problems sometimes occur. Call your caregiver if you have any  problems or questions after your procedure. HOME CARE INSTRUCTIONS You may shower the day after the procedure.Remove the bandage (dressing) and gently wash the site with plain soap and water.Gently pat the site dry.  Do not apply powder or lotion to the site.  Do not submerge the affected site in water for 3 to 5 days.  Inspect the site at least twice daily.  Do not flex or bend the affected arm for 24 hours.  No lifting over 5 pounds (2.3 kg) for 5 days after your procedure.  Do not drive home if you are discharged the same day of the procedure.  Have someone else drive you.  You may drive 24 hours after the procedure unless otherwise instructed by your caregiver.  What to expect: Any bruising will usually fade within 1 to 2 weeks.  Blood that collects in the tissue (hematoma) may be painful to the touch. It should usually decrease in size and tenderness within 1 to 2 weeks.  SEEK IMMEDIATE MEDICAL CARE IF: You have unusual pain at the radial site.  You have redness, warmth, swelling, or pain at the radial site.  You have drainage (other than a small amount of blood on the dressing).  You have chills.  You have a fever or persistent symptoms for more than 72 hours.  You have a fever and your symptoms suddenly get worse.  Your arm becomes pale, cool, tingly, or numb.  You have heavy bleeding from the site. Hold pressure on the site.   Increase activity slowly    Complete by:  As directed       Discharge Medications   Current Discharge Medication List    START taking these medications   Details  amLODipine (NORVASC) 5 MG tablet Take 1 tablet (5 mg total) by mouth daily. Qty: 90 tablet, Refills: 1      CONTINUE these medications which have CHANGED   Details  isosorbide mononitrate (IMDUR) 30 MG 24 hr tablet Take 2 tablets (60 mg total) by mouth daily. Qty: 60 tablet, Refills: 2      CONTINUE these medications which have NOT CHANGED   Details  acetaminophen (TYLENOL) 325 MG  tablet Take 650-975 mg by mouth 3 (three) times daily as needed (pain).     atorvastatin (LIPITOR) 80 MG tablet Take 1 tablet (80 mg total) by mouth daily at 6 PM. Qty: 30 tablet, Refills: 5    cetirizine (ZYRTEC) 5 MG tablet Take 5 mg by mouth daily.    donepezil (ARICEPT) 10 MG tablet Take 5 mg by mouth daily.    metoprolol (LOPRESSOR) 50 MG tablet Take 50 mg by mouth 2 (two) times daily.    Multiple Vitamin (MULTIVITAMIN WITH MINERALS) TABS tablet Take 1 tablet by mouth daily.    NITROSTAT 0.4 MG SL tablet PLACE 1 TABLET UNDER TONGUE EVERY 5 MINUTES FOR 3 DOSES AS NEEDED. Qty: 25 tablet, Refills: 6    pantoprazole (PROTONIX) 40 MG tablet Take 1 tablet (40 mg total) by mouth 2 (two) times daily before a meal. Qty: 60 tablet, Refills: 3      STOP taking these medications     hydrALAZINE (APRESOLINE) 10 MG tablet          Aspirin prescribed at discharge?  No GI bleed High Intensity Statin Prescribed? (Lipitor 40-80mg  or Crestor 20-40mg ): Yes Beta Blocker Prescribed? Yes For EF <40%, was ACEI/ARB Prescribed? No: No CKD ADP Receptor Inhibitor Prescribed? (i.e. Plavix etc.-Includes Medically Managed Patients): No: GI bleed For EF <40%, Aldosterone Inhibitor Prescribed? No: EF ok Was EF assessed during THIS hospitalization? Yes Was Cardiac Rehab II ordered? (Included Medically managed Patients): Yes   Outstanding Labs/Studies   N/a   Duration of Discharge Encounter   Greater than 30 minutes including physician time.  Signed, Reino Bellis NP-C 10/24/2016, 10:43 AM

## 2016-10-24 NOTE — Telephone Encounter (Signed)
Per review of chart, Pt to be discharged today 10/24/2016.  Will cont to monitor for TCM needs.

## 2016-10-24 NOTE — Telephone Encounter (Signed)
-----   Message from Cheryln Manly, NP sent at 10/24/2016 10:21 AM EDT ----- Regarding: TOC f/u Needs TOC f/u call  Thx Ria Comment

## 2016-10-24 NOTE — Care Management Note (Signed)
Case Management Note  Patient Details  Name: Anthony Galvan. MRN: [id number] Date of Birth: 05-05-1921  Subjective/Objective:   From home with grandson pta indep has rolling walker and scooter, has PCP and medication coverage,  Presents with [place], s/p heart cath.                 Action/Plan: NCM will follow for dc needs.  Expected Discharge Date:                  Expected Discharge Plan:  Home/Self Care  In-House Referral:  NA  Discharge planning Services  CM Consult  Post Acute Care Choice:    Choice offered to:     DME Arranged:    DME Agency:     HH Arranged:    HH Agency:     Status of Service:  Completed, signed off  If discussed at [employer] of Stay Meetings, dates discussed:    Additional Comments:  Zenon Mayo, RN 10/24/2016, 8:53 AM

## 2016-10-24 NOTE — Progress Notes (Signed)
Progress Note  Patient Name: Anthony Galvan. Date of Encounter: 10/24/2016  Primary Cardiologist:    Dr. Ellyn Hack - last seen May 2018.  Patient Profile     83 y.o. male with past medical history of CAD s/p BMS to LAD 2014, hypertension, hyperlipidemia and GI bleed in the past. He presented to the [facility] emergency department with complaints of increasing chest pressure and chest tightness with difficulty breathing intermittently.   He was lost to follow-up after May 2015  Subjective   Walk in the hallway today. No chest pain. Walks with a cane. Slow steady gait. No dyspnea. No palpitations. He does note that he still has a cough.   Inpatient Medications    Scheduled Meds: . amLODipine  5 mg Oral Daily  . aspirin EC  81 mg Oral Daily  . atorvastatin  80 mg Oral q1800  . donepezil  5 mg Oral Daily  . isosorbide mononitrate  60 mg Oral Daily  . loratadine  10 mg Oral Daily  . metoprolol tartrate  50 mg Oral BID  . pantoprazole  40 mg Oral BID AC  . sodium chloride flush  3 mL Intravenous Q12H   Continuous Infusions: . sodium chloride     PRN Meds: sodium chloride, acetaminophen, morphine injection, nitroGLYCERIN, ondansetron (ZOFRAN) IV, simethicone, sodium chloride flush   Vital Signs    Vitals:   10/23/16 1930 10/23/16 2000 10/23/16 2100 10/24/16 0147  BP: (!) 138/59 (!) 138/59 128/68 (!) 148/85  Pulse:  77 69 69  Resp: 13 17 (!) 21 14  Temp:  (!) 97.5 F (36.4 C)  98.1 F (36.7 C)  TempSrc:  Axillary  Oral  SpO2:  100% 97% 99%  Weight:    171 lb 11.8 oz (77.9 kg)  Height:        Intake/Output Summary (Last 24 hours) at 10/24/16 0848 Last data filed at 10/24/16 4008  Gross per 24 hour  Intake           2326.3 ml  Output             2750 ml  Net           -423.7 ml   Filed Weights   10/22/16 0400 10/23/16 0500 10/24/16 0147  Weight: 169 lb 8.5 oz (76.9 kg) 170 lb 3.1 oz (77.2 kg) 171 lb 11.8 oz (77.9 kg)    Telemetry    NSR 50-70s (artifact)-  Personally Reviewed  ECG    No EKG today  Physical Exam   Physical Exam  Constitutional: He is oriented to person, place, and time. He appears well-developed and well-nourished. No distress.  Appears stated age  HENT:  Head: Normocephalic and atraumatic.  Eyes: EOM are normal. Pupils are equal, round, and reactive to light.  Neck: Normal range of motion. Neck supple. No JVD present. No thyromegaly present.  Cardiovascular: Normal rate, regular rhythm, S1 normal, S2 normal and intact distal pulses.  PMI is not displaced.  Exam reveals no gallop and no friction rub.   Murmur heard.  Low-pitched harsh crescendo-decrescendo early systolic murmur is present with a grade of 1/6  at the upper right sternal border Pulmonary/Chest: Effort normal. No respiratory distress. He has no wheezes. He has rales (Bibasal. Persistent). He exhibits no tenderness.  Abdominal: Soft. Bowel sounds are normal. He exhibits no distension. There is no tenderness. There is no rebound.  Musculoskeletal: Normal range of motion. He exhibits no edema.  Right radial cath site  intact  Neurological: He is alert and oriented to person, place, and time. No cranial nerve deficit.  Skin: Skin is warm and dry. He is not diaphoretic. No erythema.  Psychiatric: He has a normal mood and affect. His behavior is normal. Thought content normal.  Nursing note and vitals reviewed.   Labs    Chemistry  Recent Labs Lab 10/22/16 0224 10/23/16 0156 10/24/16 0227  NA 136 136 138  K 4.5 4.1 4.5  CL 101 101 103  CO2 27 29 29   GLUCOSE 96 102* 111*  BUN 13 16 14   CREATININE 1.44* 1.52* 1.25*  CALCIUM 8.6* 8.3* 8.3*  GFRNONAA 41* 38* 48*  GFRAA 47* 44* 56*  ANIONGAP 8 6 6      Hematology  Recent Labs Lab 10/22/16 0224 10/23/16 0156 10/24/16 0227  WBC 10.7* 9.5 8.6  RBC 3.89* 3.45* 3.57*  HGB 11.3* 10.0* 10.1*  HCT 36.1* 32.0* 33.4*  MCV 92.8 92.8 93.6  MCH 29.0 29.0 28.3  MCHC 31.3 31.3 30.2  RDW 12.2 12.0 11.9    PLT 145* 143* 151    Cardiac Enzymes  Recent Labs Lab 10/20/16 1322 10/20/16 1640 10/20/16 2220 10/21/16 0423  TROPONINI 0.07* 0.08* 0.07* 0.07*   No results for input(s): TROPIPOC in the last 168 hours.   BNPNo results for input(s): BNP, PROBNP in the last 168 hours.   DDimer No results for input(s): DDIMER in the last 168 hours.   Radiology    No results found.  Cardiac Studies    ECHO  10/21/16: Normal LV size & Fxn.  EF 60-65% - no RWMA. Mild LVH, GR 1 DD. Mod Ao Sclerosis. Mild AI, MR. Mild RA & TV dilation. Mod TR - ~PAP ~60 mmHg  Cardiac catheterization 10/23/2016: 1. Two-vessel coronary artery disease (left dominant) with patency of the stented segment in the proximal LAD and severe heavily calcified distal circumflex/left PDA stenosis unfavorable for PCI  2. Known normal LV function by noninvasive assessment  3. Normal LVEDP  Recommend: Medical therapy for CAD. The distal circumflex/PDA lesion is severely calcified with a distal vessel location that is unfavorable for PCI and I would be very hesitant to use atherectomy in this distal location. Assessment & Plan    Principal Problem:   Unstable angina (HCC) Active Problems:   CAD S/P percutaneous coronary angioplasty   Hyperlipidemia with target low density lipoprotein (LDL) cholesterol less than 70 mg/dL   Essential hypertension   GERD   Duodenal ulcer with hemorrhage   GI bleed   CKD (chronic kidney disease) stage 3, GFR 30-59 ml/min  Principal Problem:   Unstable angina ([place]) with h/o  CAD S/P percutaneous coronary angioplasty  Cardiac catheterization revealed distal circumflex disease is likely culprit. Plan is medical management due to the heavily calcification making this a difficult lesion for intervention. Dr. Burt Knack felt that it was safer to plan medical management.   Renal function stabilizes with post-Hydration  Given recent GI bleed issues, will hold off on combination aspirin and  Plavix.  Continue calcium blocker and beta blocker at current doses  Increased Imdur dose to 30 mg  Statin    Hyperlipidemia with target low density lipoprotein (LDL) cholesterol less than 70 mg/dL - on high-dose statin currently. We'll need to reassess lipids following discharge    Essential hypertension - stable blood pressure on amlodipine, Imdur and metoprolol.    GERD/duodenal ulcer - with GI bleed: Active bleed from duodenal ulcers last month.   H&H remains stable  Continue PPI     CKD (chronic kidney disease) stage 3, GFR 30-59 ml/min -   Creatinine improved with post-cath hydration down to 1.25 now.    He is noting cough. We will check a chest x-ray I ordered incentive spirometry  Pending results of chest x-ray, expected to be ready for discharge later today.   Signed, Glenetta Hew, MD  10/24/2016, 8:48 AM

## 2016-10-26 ENCOUNTER — Telehealth: Payer: Self-pay

## 2016-10-26 NOTE — Telephone Encounter (Signed)
Received call back from Pt's daughter.  Daughter states Pt has shown some confusion since being discharged.  Per daughter, Pt has been wobbly with his gait.  Pt has trouble getting in and out of car and has difficulty getting into his house d/t steps.  Per daughter, Pt lives alone.  Daughter denies Pt has walker.  Daughter states Pt ambulates with cane.  Notified daughter this nurse would speak with Dr. Ellyn Hack and see what additional assistance would benefit her father.  Will cont to monitor.

## 2016-10-26 NOTE — Telephone Encounter (Signed)
Patient contacted regarding discharge from [facility] on 10/24/2016.  Patient understands to follow up with provider Almyra Deforest on 7/25/20182 at 0900 at [facility] office. Patient understands discharge instructions? Yes-states he has no questions Patient understands medications and regiment? Yes, Pt states he got his new med (amlodipine) Patient understands to bring all medications to this visit? yes  Pt states he is doing fine.  Denies any recurrent chest pain.  States he has all new medications.  Denies any questions.  Thanked this nurse for calling.   Addl:  Also placed call to patient's daughter.  Will speak with her also.

## 2016-10-27 NOTE — Telephone Encounter (Signed)
Late entry --Spoke to patient's daughter . Informed her to contact patient's primary in regards to symptoms given earlier to the previous nurse. patient may have other underlining issues.  Instructed daughter to make sure patient is hydrated. She stated he went out yesterday.  she verbalized understanding and states she wil talk with her father.

## 2016-10-27 NOTE — Telephone Encounter (Signed)
Received call from Pt.  Pt thought he had said he would call me today.  That was never discussed in our conversation yesterday.  Pt difficult to understand, said something about transferring his medical records to our office?  Agreed with what Pt was saying.  Will cont to monitor.  Message sent to attending card.

## 2016-10-30 NOTE — Telephone Encounter (Signed)
Agree that these are concerns best dealt with by PCP.  Glenetta Hew, MD

## 2016-11-08 ENCOUNTER — Ambulatory Visit (INDEPENDENT_AMBULATORY_CARE_PROVIDER_SITE_OTHER): Payer: Medicare Other | Admitting: Physician Assistant

## 2016-11-08 ENCOUNTER — Encounter: Payer: Self-pay | Admitting: Physician Assistant

## 2016-11-08 VITALS — BP 162/75 | HR 78 | Ht 64.0 in | Wt 174.0 lb

## 2016-11-08 DIAGNOSIS — I2510 Atherosclerotic heart disease of native coronary artery without angina pectoris: Secondary | ICD-10-CM | POA: Diagnosis not present

## 2016-11-08 DIAGNOSIS — I10 Essential (primary) hypertension: Secondary | ICD-10-CM | POA: Diagnosis not present

## 2016-11-08 DIAGNOSIS — E785 Hyperlipidemia, unspecified: Secondary | ICD-10-CM | POA: Diagnosis not present

## 2016-11-08 DIAGNOSIS — R0989 Other specified symptoms and signs involving the circulatory and respiratory systems: Secondary | ICD-10-CM | POA: Diagnosis not present

## 2016-11-08 NOTE — Patient Instructions (Signed)
Medication Instructions:   No changes  Labwork:   none  Testing/Procedures:  none  Follow-Up:  3 months with Dr. Ellyn Hack   If you need a refill on your cardiac medications before your next appointment, please call your pharmacy.

## 2016-11-08 NOTE — Progress Notes (Signed)
Cardiology Office Note    Date:  11/09/2016   ID:  Anthony Galvan., DOB 30-May-1921, MRN [id number]  PCP:  [name], MD  Cardiologist:  Dr. Ellyn Hack   Chief Complaint  Patient presents with  . Follow-up    seen for Dr. Ellyn Hack    History of Present Illness:  Anthony Galvan. is a 83 y.o. male with PMH of HTN, HLD, OA, GERD, H Pylori gastritis, and CAD with BMS to LAD in 2014. He had echocardiogram done at [facility] on 03/24/2016 which showed EF 55%, moderate LVH, normal right ventricular systolic function, mild AI, mild TR. He was recently taken off of Plavix on 10/10/2016 after being found with duodenal ulcer and erosive gastritis. He later presented to the hospital on 10/20/2016 with substernal chest pain. He is on Protonix twice a day for the next 3 until at least September 2018 for GI protection. His symptom was concerning for unstable angina. Echocardiogram obtained on 10/21/2016 showed EF 87-56%, grade 1 diastolic dysfunction, mild MR, mild AI, moderate TR, peak PA pressure 60 mmHg. He eventually underwent cardiac catheterization on 10/23/2016 which revealed two-vessel CAD with patency of stented segment in the proximal LAD, severe heavily calcified distal left circumflex/left PDA stenosis not amenable for PCI. Medical therapy was recommended. Imdur was increased to 60 mg daily. Amlodipine was added for antianginal purposes. Although he did report a cough, chest x-ray was negative for acute process.  He presents to cardiology office visit today. His blood pressure on arrival was elevated, however he says his blood pressure has been very well at home. He attributed the elevated BP to the fact they were rushing to get to cardiology office today for the appointment. Otherwise he has been doing well, he denies any further chest pain since she left the hospital. He denies any lower extremity edema, orthopnea or paroxysmal nocturnal dyspnea. He does however have fine crackles near the base of his  lung consistent with interstitial lung disease. Previous CT of abdomen obtained in 2015 showed possible interstitial lung disease, however patient was not aware of this. I will defer to primary care physician regarding further workup. After the interview, I rechecked his blood pressure manually myself, blood pressure was 128/60. Current medications controlling his angina symptom quite well, will continue. He can follow-up in 3 month.   Past Medical History:  Diagnosis Date  . CAD S/P percutaneous coronary angioplasty 08/01/2012   PCI of mid LAD with a VeriFlex Bare Metal Stent 3.0 mm x 12 mm - post-dilated to 3.5 mm.  . Duodenal ulcer May 2011   on EGD  . GERD (gastroesophageal reflux disease)   . Helicobacter pylori gastritis MAY 2012 HP STOOL AG NEG  . HTN (hypertension)   . Hyperlipidemia   . MI (myocardial infarction) ([place]) 1976, 1980, [place] for at [facility] and [place] (Dr. Iona Beard)  . Osteoarthritis   . S/P colonoscopy 2011   normal, internal hemorrhoids    Past Surgical History:  Procedure Laterality Date  . CARDIAC CATHETERIZATION  01/30/2007   D1 75 %, mid LAD 50%, 50-70% circumflex, 50-70% RCA.(Dr. Adora Fridge)  . CARDIAC CATHETERIZATION  12/11/2001   normal L main, small RCA, dominant LL Cfx with mild diffuse disease, LAD with mid 10-20% stenosis, ramus intermedius with mild diffuse disease (Dr. Jackie Plum)  . CATARACT EXTRACTION    . COLONOSCOPY  OCT 2011 [place]  in setting of MI  . ESOPHAGOGASTRODUODENOSCOPY N/A 10/09/2016   Procedure: ESOPHAGOGASTRODUODENOSCOPY (EGD);  Surgeon: Danie Binder, MD;  [facility];  Service: Endoscopy;  Laterality: N/A;  . LEFT HEART CATH AND CORONARY ANGIOGRAPHY N/A 10/23/2016   Procedure: Left Heart Cath and Coronary Angiography;  Surgeon: Sherren Mocha, MD;  Location: [facility] CV LAB;  Service: Cardiovascular;  Laterality: N/A;  . LEFT HEART CATHETERIZATION WITH  CORONARY ANGIOGRAM N/A 08/01/2012   Procedure: LEFT HEART CATHETERIZATION WITH CORONARY ANGIOGRAM;  Surgeon: Leonie Man, MD;  Location: [facility] CATH LAB: Mid LAD 99% apple core; D2 ostial 70-80% (2 small for PCI) small nondominant RCA 60-70%. Distal circumflex/L PDA ~50% --> PCI of LAD  . NM MYOCAR PERF WALL MOTION  08/2003   adenosine stress - focal decreased perfusion defect in distal inferior wall, no significant ischemic changes  . PERCUTANEOUS STENT INTERVENTION  08/01/2012   Procedure: PERCUTANEOUS STENT INTERVENTION;  Surgeon: Leonie Man, MD;  Location: [facility] CATH LAB; ;PCI of mid LAD with a VeriFlex Bare Metal Stent 3.0 mm x 12 mm - post-dilated to 3.5 mm.  . TRANSTHORACIC ECHOCARDIOGRAM  April 2014   normal LV size and function. EF 60-65%. Grade 1 diastolic soft. Mild aortic valve calcification  . UPPER GASTROINTESTINAL ENDOSCOPY  MAY 2011 MELENA, HEMATEMESIS   DUODENAL ULCER, Bx: H PYLORI POS    Current Medications: Outpatient Medications Prior to Visit  Medication Sig Dispense Refill  . acetaminophen (TYLENOL) 325 MG tablet Take 650-975 mg by mouth 3 (three) times daily as needed (pain).     Marland Kitchen amLODipine (NORVASC) 5 MG tablet Take 1 tablet (5 mg total) by mouth daily. 90 tablet 1  . atorvastatin (LIPITOR) 80 MG tablet Take 1 tablet (80 mg total) by mouth daily at 6 PM. 30 tablet 5  . cetirizine (ZYRTEC) 5 MG tablet Take 5 mg by mouth daily.    Marland Kitchen donepezil (ARICEPT) 10 MG tablet Take 5 mg by mouth daily.    . isosorbide mononitrate (IMDUR) 30 MG 24 hr tablet Take 2 tablets (60 mg total) by mouth daily. 60 tablet 2  . metoprolol (LOPRESSOR) 50 MG tablet Take 50 mg by mouth 2 (two) times daily.    . Multiple Vitamin (MULTIVITAMIN WITH MINERALS) TABS tablet Take 1 tablet by mouth daily.    Marland Kitchen NITROSTAT 0.4 MG SL tablet PLACE 1 TABLET UNDER TONGUE EVERY 5 MINUTES FOR 3 DOSES AS NEEDED. (Patient taking differently: PLACE 1 TABLET UNDER TONGUE EVERY 5 MINUTES FOR 3 DOSES AS NEEDED FOR CHEST  PAIN.) 25 tablet 6  . pantoprazole (PROTONIX) 40 MG tablet Take 1 tablet (40 mg total) by mouth 2 (two) times daily before a meal. 60 tablet 3   No facility-administered medications prior to visit.      Allergies:   Patient has no known allergies.   Social History   Social History  . Marital status: Widowed    Spouse name: N/A  . Number of children: 3  . Years of education: N/A   Social History Main Topics  . Smoking status: Never Smoker  . Smokeless tobacco: Never Used  . Alcohol use No  . Drug use: No  . Sexual activity: Not Asked   Other Topics Concern  . None   Social History Narrative   Is a father of 3 with 3 grandchildren 3 great-grandchildren. He lives with one of his 2 grandsons.   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 He was a war 2 veteran in the [place]. 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   He never smoked. He does not drink alcohol.   He is very active, and a majority. He walks 30+ minutes every day. He also likes to play golf.     Family History:  The patient's family history includes Heart disease in his mother and sister; Heart disease (age of onset: 6) in his brother; Kidney disease in his father.   ROS:   Please see the history of present illness.    ROS All other systems reviewed and are negative.   PHYSICAL EXAM:   VS:  BP (!) 162/75   Pulse 78   Ht 5\' 4"  (1.626 m)   Wt 174 lb (78.9 kg)   SpO2 90%   BMI 29.87 kg/m    GEN: Well nourished, well developed, in no acute distress  HEENT: normal  Neck: no JVD, carotid bruits, or masses Cardiac: RRR; no murmurs, rubs, or gallops,no edema  Respiratory:  clear to auscultation bilaterally, normal work of breathing GI: soft, nontender, nondistended, + BS MS: no deformity or atrophy  Skin: warm and dry, no rash Neuro:  Alert and Oriented x 3, Strength and sensation are  intact Psych: euthymic mood, full affect  Wt Readings from Last 3 Encounters:  11/08/16 174 lb (78.9 kg)  10/24/16 171 lb 11.8 oz (77.9 kg)  10/08/16 170 lb (77.1 kg)      Studies/Labs Reviewed:   EKG:  EKG is not ordered today.    Recent Labs: 10/08/2016: ALT 11 10/24/2016: BUN 14; Creatinine, Ser 1.25; Hemoglobin 10.1; Platelets 151; Potassium 4.5; Sodium 138   Lipid Panel    Component Value Date/Time   CHOL 119 10/21/2016 0423   TRIG 87 10/21/2016 0423   HDL 57 10/21/2016 0423   CHOLHDL 2.1 10/21/2016 0423   VLDL 17 10/21/2016 0423   LDLCALC 45 10/21/2016 0423    Additional studies/ records that were reviewed today include:   Echo 03/2016 at [facility] NORMAL RIGHT VENTRICULAR SYSTOLIC FUNCTION VALVULAR REGURGITATION: MILD AR, MILD TR NO VALVULAR STENOSIS NO PRIOR STUDY FOR COMPARISON   Echocardiogram 10/21/2016 LV EF: 60% -   65%  ------------------------------------------------------------------- Indications:      Chest pain 786.51.  ------------------------------------------------------------------- History:   PMH:   Coronary artery disease.  Risk factors: Hypertension. Dyslipidemia.  ------------------------------------------------------------------- Study Conclusions  - Left ventricle: The cavity size was normal. Wall thickness was   increased in a pattern of mild LVH. Systolic function was normal.   The estimated ejection fraction was in the range of 60% to 65%.   Wall motion was normal; there were no regional wall motion   abnormalities. Doppler parameters are consistent with abnormal   left ventricular relaxation (grade 1 diastolic dysfunction). - Aortic valve: Moderately calcified annulus. Trileaflet. There was   mild regurgitation. - Mitral valve: There was mild regurgitation. - Right ventricle: The cavity size was mildly dilated. - Right atrium: The atrium was mildly dilated. -  Tricuspid valve: There was moderate regurgitation. - Pulmonary arteries: PA peak pressure: 60 mm Hg (S).   Cath 10/23/2016 Conclusion   1. Two-vessel coronary artery disease (left dominant) with patency of the stented segment in the proximal LAD and severe heavily calcified distal circumflex/left PDA stenosis unfavorable for PCI  2. Known normal  LV function by noninvasive assessment  3. Normal LVEDP  Recommend: Medical therapy for CAD. The distal circumflex/PDA lesion is severely calcified with a distal vessel location that is unfavorable for PCI and I would be very hesitant to use atherectomy in this distal location.      ASSESSMENT:    1. Coronary artery disease involving native coronary artery of native heart without angina pectoris   2. Essential hypertension   3. Hyperlipidemia, unspecified hyperlipidemia type   4. Bibasilar crackles      PLAN:  In order of problems listed above:  1. CAD: Angina well controlled on current medication. Known diffuse disease in PDA and left circumflex not amenable to PCI.  2. Hypertension: Initial blood pressure was elevated on first arrival, however on repeat manual check, blood pressure is actually quite well-controlled.  3. Hyperlipidemia: On Lipitor 80 mg daily.  4. Bibasilar crackle: High suspicion he has interstitial lung disease. The fine crackles, not consistent with interstitial edema. Surprisingly, he is not symptomatic, therefore I did not pursue any further workup. Will . Primary care provider to decide if further workup is truly warranted.    Medication Adjustments/Labs and Tests Ordered: Current medicines are reviewed at length with the patient today.  Concerns regarding medicines are outlined above.  Medication changes, Labs and Tests ordered today are listed in the Patient Instructions below. Patient Instructions  Medication Instructions:   No changes  Labwork:    none  Testing/Procedures:  none  Follow-Up:  3 months with Dr. Ellyn Hack   If you need a refill on your cardiac medications before your next appointment, please call your pharmacy.      Hilbert Corrigan, [place]  11/09/2016 3:45 PM    [facility] [address], [place], [place]  [address] Phone: [phone]; Fax: [phone]

## 2016-11-09 ENCOUNTER — Encounter: Payer: Self-pay | Admitting: Physician Assistant

## 2016-11-30 ENCOUNTER — Other Ambulatory Visit: Payer: Self-pay

## 2016-11-30 ENCOUNTER — Encounter (HOSPITAL_COMMUNITY): Payer: Self-pay | Admitting: Emergency Medicine

## 2016-11-30 ENCOUNTER — Inpatient Hospital Stay (HOSPITAL_COMMUNITY)
Admission: EM | Admit: 2016-11-30 | Discharge: 2016-12-04 | DRG: 481 | Disposition: A | Discharge status: Skilled Nursing Facility | Payer: Medicare Other | Attending: Family Medicine | Admitting: Family Medicine

## 2016-11-30 ENCOUNTER — Emergency Department (HOSPITAL_COMMUNITY): Payer: Medicare Other

## 2016-11-30 DIAGNOSIS — E559 Vitamin D deficiency, unspecified: Secondary | ICD-10-CM | POA: Diagnosis not present

## 2016-11-30 DIAGNOSIS — S72001A Fracture of unspecified part of neck of right femur, initial encounter for closed fracture: Secondary | ICD-10-CM | POA: Diagnosis not present

## 2016-11-30 DIAGNOSIS — S72141A Displaced intertrochanteric fracture of right femur, initial encounter for closed fracture: Secondary | ICD-10-CM | POA: Diagnosis not present

## 2016-11-30 DIAGNOSIS — S79911A Unspecified injury of right hip, initial encounter: Secondary | ICD-10-CM | POA: Diagnosis not present

## 2016-11-30 DIAGNOSIS — I10 Essential (primary) hypertension: Secondary | ICD-10-CM

## 2016-11-30 DIAGNOSIS — S72144A Nondisplaced intertrochanteric fracture of right femur, initial encounter for closed fracture: Secondary | ICD-10-CM | POA: Diagnosis not present

## 2016-11-30 DIAGNOSIS — Z79899 Other long term (current) drug therapy: Secondary | ICD-10-CM | POA: Diagnosis not present

## 2016-11-30 DIAGNOSIS — K219 Gastro-esophageal reflux disease without esophagitis: Secondary | ICD-10-CM | POA: Diagnosis present

## 2016-11-30 DIAGNOSIS — Z8711 Personal history of peptic ulcer disease: Secondary | ICD-10-CM

## 2016-11-30 DIAGNOSIS — S92153A Displaced avulsion fracture (chip fracture) of unspecified talus, initial encounter for closed fracture: Secondary | ICD-10-CM | POA: Diagnosis not present

## 2016-11-30 DIAGNOSIS — M810 Age-related osteoporosis without current pathological fracture: Secondary | ICD-10-CM | POA: Diagnosis not present

## 2016-11-30 DIAGNOSIS — W19XXXA Unspecified fall, initial encounter: Secondary | ICD-10-CM

## 2016-11-30 DIAGNOSIS — T148XXA Other injury of unspecified body region, initial encounter: Secondary | ICD-10-CM

## 2016-11-30 DIAGNOSIS — S72141K Displaced intertrochanteric fracture of right femur, subsequent encounter for closed fracture with nonunion: Secondary | ICD-10-CM | POA: Diagnosis not present

## 2016-11-30 DIAGNOSIS — N183 Chronic kidney disease, stage 3 (moderate): Secondary | ICD-10-CM | POA: Diagnosis not present

## 2016-11-30 DIAGNOSIS — I129 Hypertensive chronic kidney disease with stage 1 through stage 4 chronic kidney disease, or unspecified chronic kidney disease: Secondary | ICD-10-CM | POA: Diagnosis present

## 2016-11-30 DIAGNOSIS — I252 Old myocardial infarction: Secondary | ICD-10-CM | POA: Diagnosis not present

## 2016-11-30 DIAGNOSIS — M25551 Pain in right hip: Secondary | ICD-10-CM | POA: Diagnosis not present

## 2016-11-30 DIAGNOSIS — Y9389 Activity, other specified: Secondary | ICD-10-CM | POA: Diagnosis not present

## 2016-11-30 DIAGNOSIS — M1991 Primary osteoarthritis, unspecified site: Secondary | ICD-10-CM | POA: Diagnosis not present

## 2016-11-30 DIAGNOSIS — Z955 Presence of coronary angioplasty implant and graft: Secondary | ICD-10-CM

## 2016-11-30 DIAGNOSIS — R279 Unspecified lack of coordination: Secondary | ICD-10-CM | POA: Diagnosis not present

## 2016-11-30 DIAGNOSIS — S2239XA Fracture of one rib, unspecified side, initial encounter for closed fracture: Secondary | ICD-10-CM | POA: Diagnosis not present

## 2016-11-30 DIAGNOSIS — Y92008 Other place in unspecified non-institutional (private) residence as the place of occurrence of the external cause: Secondary | ICD-10-CM | POA: Diagnosis not present

## 2016-11-30 DIAGNOSIS — E785 Hyperlipidemia, unspecified: Secondary | ICD-10-CM | POA: Diagnosis not present

## 2016-11-30 DIAGNOSIS — I25810 Atherosclerosis of coronary artery bypass graft(s) without angina pectoris: Secondary | ICD-10-CM | POA: Diagnosis not present

## 2016-11-30 DIAGNOSIS — Z419 Encounter for procedure for purposes other than remedying health state, unspecified: Secondary | ICD-10-CM

## 2016-11-30 DIAGNOSIS — W010XXA Fall on same level from slipping, tripping and stumbling without subsequent striking against object, initial encounter: Secondary | ICD-10-CM | POA: Diagnosis present

## 2016-11-30 DIAGNOSIS — I2510 Atherosclerotic heart disease of native coronary artery without angina pectoris: Secondary | ICD-10-CM

## 2016-11-30 DIAGNOSIS — E8889 Other specified metabolic disorders: Secondary | ICD-10-CM | POA: Diagnosis present

## 2016-11-30 DIAGNOSIS — M1990 Unspecified osteoarthritis, unspecified site: Secondary | ICD-10-CM | POA: Diagnosis not present

## 2016-11-30 DIAGNOSIS — S7291XA Unspecified fracture of right femur, initial encounter for closed fracture: Secondary | ICD-10-CM | POA: Diagnosis not present

## 2016-11-30 DIAGNOSIS — M79651 Pain in right thigh: Secondary | ICD-10-CM | POA: Diagnosis not present

## 2016-11-30 DIAGNOSIS — D62 Acute posthemorrhagic anemia: Secondary | ICD-10-CM | POA: Diagnosis not present

## 2016-11-30 DIAGNOSIS — M6281 Muscle weakness (generalized): Secondary | ICD-10-CM | POA: Diagnosis not present

## 2016-11-30 DIAGNOSIS — G8911 Acute pain due to trauma: Secondary | ICD-10-CM | POA: Diagnosis not present

## 2016-11-30 LAB — BASIC METABOLIC PANEL
ANION GAP: 9 (ref 5–15)
BUN: 15 mg/dL (ref 6–20)
CO2: 27 mmol/L (ref 22–32)
Calcium: 8.8 mg/dL — ABNORMAL LOW (ref 8.9–10.3)
Chloride: 101 mmol/L (ref 101–111)
Creatinine, Ser: 1.21 mg/dL (ref 0.61–1.24)
GFR calc non Af Amer: 50 mL/min — ABNORMAL LOW (ref >60)
GFR, EST AFRICAN AMERICAN: 58 mL/min — AB (ref >60)
GLUCOSE: 95 mg/dL (ref 65–99)
POTASSIUM: 4.8 mmol/L (ref 3.5–5.1)
Sodium: 137 mmol/L (ref 135–145)

## 2016-11-30 LAB — CBC WITH DIFFERENTIAL/PLATELET
Basophils Absolute: 0.1 10*3/uL (ref 0.0–0.1)
Basophils Relative: 0 %
Eosinophils Absolute: 0.2 10*3/uL (ref 0.0–0.7)
Eosinophils Relative: 2 %
HEMATOCRIT: 40.9 % (ref 39.0–52.0)
HEMOGLOBIN: 12.9 g/dL — AB (ref 13.0–17.0)
LYMPHS PCT: 14 %
Lymphs Abs: 2.1 10*3/uL (ref 0.7–4.0)
MCH: 28.9 pg (ref 26.0–34.0)
MCHC: 31.5 g/dL (ref 30.0–36.0)
MCV: 91.5 fL (ref 78.0–100.0)
MONO ABS: 1.1 10*3/uL — AB (ref 0.1–1.0)
MONOS PCT: 7 %
NEUTROS ABS: 11.3 10*3/uL — AB (ref 1.7–7.7)
NEUTROS PCT: 77 %
Platelets: 151 10*3/uL (ref 150–400)
RBC: 4.47 MIL/uL (ref 4.22–5.81)
RDW: 11.9 % (ref 11.5–15.5)
WBC: 14.7 10*3/uL — ABNORMAL HIGH (ref 4.0–10.5)

## 2016-11-30 LAB — PROTIME-INR
INR: 0.96
Prothrombin Time: 12.8 seconds (ref 11.4–15.2)

## 2016-11-30 MED ORDER — ADULT MULTIVITAMIN W/MINERALS CH
1.0000 | ORAL_TABLET | Freq: Every day | ORAL | Status: DC
  Administered 2016-12-02 – 2016-12-04 (×3): 1 via ORAL
  Filled 2016-11-30 (×3): qty 1

## 2016-11-30 MED ORDER — MORPHINE SULFATE (PF) 4 MG/ML IV SOLN
4.0000 mg | Freq: Once | INTRAVENOUS | Status: AC
  Administered 2016-11-30: 4 mg via INTRAVENOUS
  Filled 2016-11-30: qty 1

## 2016-11-30 MED ORDER — DONEPEZIL HCL 5 MG PO TABS
5.0000 mg | ORAL_TABLET | Freq: Every day | ORAL | Status: DC
Start: 2016-12-01 — End: 2016-12-04
  Administered 2016-12-02 – 2016-12-04 (×3): 5 mg via ORAL
  Filled 2016-11-30 (×3): qty 1

## 2016-11-30 MED ORDER — PANTOPRAZOLE SODIUM 40 MG PO TBEC
40.0000 mg | DELAYED_RELEASE_TABLET | Freq: Two times a day (BID) | ORAL | Status: DC
  Administered 2016-12-01 – 2016-12-04 (×7): 40 mg via ORAL
  Filled 2016-11-30 (×7): qty 1

## 2016-11-30 MED ORDER — LORATADINE 10 MG PO TABS
10.0000 mg | ORAL_TABLET | Freq: Every day | ORAL | Status: DC
  Administered 2016-12-02 – 2016-12-04 (×3): 10 mg via ORAL
  Filled 2016-11-30 (×3): qty 1

## 2016-11-30 MED ORDER — ISOSORBIDE MONONITRATE ER 60 MG PO TB24
60.0000 mg | ORAL_TABLET | Freq: Every day | ORAL | Status: DC
  Administered 2016-11-30 – 2016-12-04 (×4): 60 mg via ORAL
  Filled 2016-11-30 (×3): qty 1

## 2016-11-30 MED ORDER — METOPROLOL TARTRATE 50 MG PO TABS
50.0000 mg | ORAL_TABLET | Freq: Two times a day (BID) | ORAL | Status: DC
  Administered 2016-11-30 – 2016-12-04 (×7): 50 mg via ORAL
  Filled 2016-11-30 (×8): qty 1

## 2016-11-30 MED ORDER — AMLODIPINE BESYLATE 5 MG PO TABS
5.0000 mg | ORAL_TABLET | Freq: Every day | ORAL | Status: DC
Start: 2016-12-01 — End: 2016-12-04
  Administered 2016-12-02 – 2016-12-04 (×3): 5 mg via ORAL
  Filled 2016-11-30 (×3): qty 1

## 2016-11-30 MED ORDER — MORPHINE SULFATE (PF) 2 MG/ML IV SOLN
0.5000 mg | INTRAVENOUS | Status: DC | PRN
  Administered 2016-11-30: 0.5 mg via INTRAVENOUS
  Filled 2016-11-30: qty 1

## 2016-11-30 MED ORDER — ENOXAPARIN SODIUM 40 MG/0.4ML ~~LOC~~ SOLN
40.0000 mg | SUBCUTANEOUS | Status: DC
  Administered 2016-11-30 – 2016-12-03 (×4): 40 mg via SUBCUTANEOUS
  Filled 2016-11-30 (×4): qty 0.4

## 2016-11-30 MED ORDER — ACETAMINOPHEN 325 MG PO TABS
650.0000 mg | ORAL_TABLET | Freq: Three times a day (TID) | ORAL | Status: DC | PRN
  Administered 2016-12-02 – 2016-12-04 (×5): 650 mg via ORAL
  Filled 2016-11-30 (×5): qty 2

## 2016-11-30 MED ORDER — ATORVASTATIN CALCIUM 80 MG PO TABS
80.0000 mg | ORAL_TABLET | Freq: Every day | ORAL | Status: DC
  Administered 2016-12-01 – 2016-12-04 (×4): 80 mg via ORAL
  Filled 2016-11-30 (×4): qty 1

## 2016-11-30 MED ORDER — MORPHINE SULFATE (PF) 4 MG/ML IV SOLN
0.5000 mg | INTRAVENOUS | Status: DC | PRN
  Administered 2016-12-01 (×2): 0.52 mg via INTRAVENOUS
  Filled 2016-11-30 (×2): qty 1

## 2016-11-30 MED ORDER — HYDROCODONE-ACETAMINOPHEN 5-325 MG PO TABS
1.0000 | ORAL_TABLET | Freq: Four times a day (QID) | ORAL | Status: DC | PRN
  Administered 2016-11-30 – 2016-12-01 (×3): 2 via ORAL
  Administered 2016-12-02 – 2016-12-04 (×3): 1 via ORAL
  Filled 2016-11-30 (×3): qty 2
  Filled 2016-11-30 (×4): qty 1

## 2016-11-30 NOTE — Progress Notes (Signed)
Patient ID: Anthony Galvan, male   DOB: 1921-10-13, 83 y.o.   MRN: [id number]  92 active with 2 pt intertroch fracture right hip amenable to ORIF with gamma nail or similar device   Medical history: Anthony Steck. is a 83 y.o. male with PMH of HTN, HLD, OA, GERD, H Pylori gastritis, and CAD with BMS to LAD in 2014. He had echocardiogram done at [facility] on 03/24/2016 which showed EF 55%, moderate LVH, normal right ventricular systolic function, mild AI, mild TR. He was recently taken off of Plavix on 10/10/2016 after being found with duodenal ulcer and erosive gastritis. He later presented to the hospital on 10/20/2016 with substernal chest pain. He is on Protonix twice a day for the next 3 until at least September 2018 for GI protection. His symptom was concerning for unstable angina. Echocardiogram obtained on 10/21/2016 showed EF 63-33%, grade 1 diastolic dysfunction, mild MR, mild AI, moderate TR, peak PA pressure 60 mmHg. He eventually underwent cardiac catheterization on 10/23/2016 which revealed two-vessel CAD with patency of stented segment in the proximal LAD, severe heavily calcified distal left circumflex/left PDA stenosis not amenable for PCI. Medical therapy was recommended   I spoke to our anesthesia department and we both agree that recent MI and Distal left circumflex and left PDA stenosis with inability to PCI, weekend coming transfer should be done

## 2016-11-30 NOTE — ED Provider Notes (Signed)
[facility] DEPT Provider Note   CSN: [id number] Arrival date & time: 11/30/16  1111     History   Chief Complaint Chief Complaint  Patient presents with  . Leg Pain    HPI Anthony Galvan is a 83 y.o. male.  Patient is a 83 year old male with history of hypertension, coronary artery disease with stents presenting with complaints of fall. He states he was moving an object in his carport when he lost balance, fell, and injured his right hip. He attempted to stand and ambulate, however it was too painful area he denies any other injury.   The history is provided by the patient.  Leg Pain   This is a new problem. The current episode started less than 1 hour ago. The problem occurs constantly. The problem has not changed since onset.Pain location: Right hip. The pain is moderate. Pertinent negatives include no numbness. He has tried nothing for the symptoms. The treatment provided no relief.    Past Medical History:  Diagnosis Date  . CAD S/P percutaneous coronary angioplasty 08/01/2012   PCI of mid LAD with a VeriFlex Bare Metal Stent 3.0 mm x 12 mm - post-dilated to 3.5 mm.  . Duodenal ulcer May 2011   on EGD  . GERD (gastroesophageal reflux disease)   . Helicobacter pylori gastritis MAY 2012 HP STOOL AG NEG  . HTN (hypertension)   . Hyperlipidemia   . MI (myocardial infarction) ([place]) 1976, 1980, [place] for at [facility] and [place] (Dr. Iona Beard)  . Osteoarthritis   . S/P colonoscopy 2011   normal, internal hemorrhoids    Patient Active Problem List   Diagnosis Date Noted  . Hyperlipidemia 10/20/2016  . PUD (peptic ulcer disease)   . GI bleed 10/08/2016  . History of coronary artery stent placement 10/08/2016  . Acute on chronic renal failure ([place]) 10/08/2016  . CKD (chronic kidney disease) stage 3, GFR 30-59 ml/min 10/08/2016  . Melena 10/08/2016  . Right sided abdominal pain 09/03/2013  . Bloating 09/03/2013  . Nausea with vomiting 09/03/2013  .  CAD S/P percutaneous coronary angioplasty   . MI (myocardial infarction) ([place])   . Presence of bare metal stent in LAD coronary artery - VerifFlex BMS 3.0 mm x 12 mm - post-dilated to 3.5 mm 08/01/2012  . Unstable angina ([place]) 07/30/2012  . Gastritis 12/15/2010  . Nausea 08/30/2010  . Duodenal ulcer with hemorrhage 02/10/2010  . Hyperlipidemia with target low density lipoprotein (LDL) cholesterol less than 70 mg/dL 05/21/2006  . CATARACT NOS 05/21/2006  . Essential hypertension 05/21/2006  . MYOCARDIAL INFARCTION, HX OF 05/21/2006  . GERD 05/21/2006  . OVERACTIVE BLADDER 05/21/2006  . OSTEOARTHRITIS 05/21/2006  . URINARY INCONTINENCE 05/21/2006    Past Surgical History:  Procedure Laterality Date  . CARDIAC CATHETERIZATION  01/30/2007   D1 75 %, mid LAD 50%, 50-70% circumflex, 50-70% RCA.(Dr. Adora Fridge)  . CARDIAC CATHETERIZATION  12/11/2001   normal L main, small RCA, dominant LL Cfx with mild diffuse disease, LAD with mid 10-20% stenosis, ramus intermedius with mild diffuse disease (Dr. Jackie Plum)  . CATARACT EXTRACTION    . COLONOSCOPY  OCT 2011 [facility]   [facility] [place]   in setting of MI  . ESOPHAGOGASTRODUODENOSCOPY N/A 10/09/2016   Procedure: ESOPHAGOGASTRODUODENOSCOPY (EGD);  Surgeon: Danie Binder, MD;  [facility];  Service: Endoscopy;  Laterality: N/A;  . LEFT HEART CATH AND CORONARY ANGIOGRAPHY N/A 10/23/2016  Procedure: Left Heart Cath and Coronary Angiography;  Surgeon: Sherren Mocha, MD;  Location: [facility] CV LAB;  Service: Cardiovascular;  Laterality: N/A;  . LEFT HEART CATHETERIZATION WITH CORONARY ANGIOGRAM N/A 08/01/2012   Procedure: LEFT HEART CATHETERIZATION WITH CORONARY ANGIOGRAM;  Surgeon: Leonie Man, MD;  Location[facility] CATH LAB: Mid LAD 99% apple core; D2 ostial 70-80% (2 small for PCI) small nondominant RCA 60-70%. Distal circumflex/L PDA ~50% --> PCI of LAD  . NM MYOCAR PERF WALL MOTION  08/2003   adenosine stress -  focal decreased perfusion defect in distal inferior wall, no significant ischemic changes  . PERCUTANEOUS STENT INTERVENTION  08/01/2012   Procedure: PERCUTANEOUS STENT INTERVENTION;  Surgeon: Leonie Man, MD;  Location: [facility] CATH LAB; ;PCI of mid LAD with a VeriFlex Bare Metal Stent 3.0 mm x 12 mm - post-dilated to 3.5 mm.  . TRANSTHORACIC ECHOCARDIOGRAM  April 2014   normal LV size and function. EF 60-65%. Grade 1 diastolic soft. Mild aortic valve calcification  . UPPER GASTROINTESTINAL ENDOSCOPY  MAY 2011 MELENA, HEMATEMESIS   DUODENAL ULCER, Bx: H PYLORI POS       Home Medications    Prior to Admission medications   Medication Sig Start Date End Date Taking? Authorizing Provider  acetaminophen (TYLENOL) 325 MG tablet Take 650-975 mg by mouth 3 (three) times daily as needed (pain).     [provider]  amLODipine (NORVASC) 5 MG tablet Take 1 tablet (5 mg total) by mouth daily. 10/24/16   Cheryln Manly, NP  atorvastatin (LIPITOR) 80 MG tablet Take 1 tablet (80 mg total) by mouth daily at 6 PM. 08/02/12   Brett Canales, PA-C  cetirizine (ZYRTEC) 5 MG tablet Take 5 mg by mouth daily.    [provider]  donepezil (ARICEPT) 10 MG tablet Take 5 mg by mouth daily.    [provider]  isosorbide mononitrate (IMDUR) 30 MG 24 hr tablet Take 2 tablets (60 mg total) by mouth daily. 10/24/16   Cheryln Manly, NP  metoprolol (LOPRESSOR) 50 MG tablet Take 50 mg by mouth 2 (two) times daily.    [provider]  Multiple Vitamin (MULTIVITAMIN WITH MINERALS) TABS tablet Take 1 tablet by mouth daily.    [provider]  NITROSTAT 0.4 MG SL tablet PLACE 1 TABLET UNDER TONGUE EVERY 5 MINUTES FOR 3 DOSES AS NEEDED. Patient taking differently: PLACE 1 TABLET UNDER TONGUE EVERY 5 MINUTES FOR 3 DOSES AS NEEDED FOR CHEST PAIN. 11/17/13   Leonie Man, MD  pantoprazole (PROTONIX) 40 MG tablet Take 1 tablet (40 mg total) by mouth 2 (two) times daily before a  meal. 10/10/16   Robbie Lis, MD    Family History Family History  Problem Relation Age of Onset  . Heart disease Mother   . Kidney disease Father   . Heart disease Brother 71  . Heart disease Sister   . Colon cancer Neg Hx     Social History Social History  Substance Use Topics  . Smoking status: Never Smoker  . Smokeless tobacco: Never Used  . Alcohol use No     Allergies   Patient has no known allergies.   Review of Systems Review of Systems  Neurological: Negative for numbness.  All other systems reviewed and are negative.    Physical Exam Updated Vital Signs BP 138/79   Pulse 78   Temp 98.5 F (36.9 C)   Resp 18   Ht 5\' 4"  (  1.626 m)   Wt 77.1 kg (170 lb)   SpO2 94%   BMI 29.18 kg/m   Physical Exam  Constitutional: He is oriented to person, place, and time. He appears well-developed and well-nourished. No distress.  HENT:  Head: Normocephalic and atraumatic.  Mouth/Throat: Oropharynx is clear and moist.  Neck: Normal range of motion. Neck supple.  Cardiovascular: Normal rate and regular rhythm.  Exam reveals no friction rub.   No murmur heard. Pulmonary/Chest: Effort normal and breath sounds normal. No respiratory distress. He has no wheezes. He has no rales.  Abdominal: Soft. Bowel sounds are normal. He exhibits no distension. There is no tenderness.  Musculoskeletal: Normal range of motion. He exhibits no edema.  The right leg is externally rotated. Motor and sensation are intact and DP pulses are easily palpable. There is tenderness over the lateral aspect of the right hip.  Neurological: He is alert and oriented to person, place, and time. Coordination normal.  Skin: Skin is warm and dry. He is not diaphoretic.  Nursing note and vitals reviewed.    ED Treatments / Results  Labs (all labs ordered are listed, but only abnormal results are displayed) Labs Reviewed  BASIC METABOLIC PANEL  CBC WITH DIFFERENTIAL/PLATELET  PROTIME-INR     EKG  EKG Interpretation None       Radiology Dg Chest 1 View  Result Date: 11/30/2016 CLINICAL DATA:  Right hip pain, fracture EXAM: CHEST 1 VIEW COMPARISON:  10/24/2016 FINDINGS: Heart is borderline in size. Chronic interstitial prominence throughout the lungs, likely chronic interstitial lung disease/ scarring. Patchy opacity in the inferior right upper lobe could reflect scarring or early infiltrate. No effusions. IMPRESSION: Areas of chronic interstitial prominence is scarring in the lungs. Inferior right upper lobe scarring versus early infiltrate. Borderline heart size. Electronically Signed   By: Rolm Baptise M.D.   On: 11/30/2016 12:07   Dg Hip Unilat W Or Wo Pelvis 2-3 Views Right  Result Date: 11/30/2016 CLINICAL DATA:  Fall.  Right hip pain. EXAM: DG HIP (WITH OR WITHOUT PELVIS) 2-3V RIGHT COMPARISON:  None. FINDINGS: There is a right femoral intertrochanteric fracture with varus angulation. No subluxation or dislocation. Mild osteoarthritic changes in the hips bilaterally. SI joints are symmetric and unremarkable. IMPRESSION: Right femoral intertrochanteric fracture with mild varus angulation. Electronically Signed   By: Rolm Baptise M.D.   On: 11/30/2016 12:07    Procedures Procedures (including critical care time)  Medications Ordered in ED Medications  morphine 4 MG/ML injection 4 mg (not administered)     Initial Impression / Assessment and Plan / ED Course  I have reviewed the triage vital signs and the nursing notes.  Pertinent labs & imaging results that were available during my care of the patient were reviewed by me and considered in my medical decision making (see chart for details).  X-rays show a right intertrochanteric hip fracture. This finding was discussed with Dr. Aline Brochure from orthopedics who believes that given the patient's coronary artery disease would be best served by surgical repair being performed at [facility]. I've spoken with Dr. Marcelino Scot from  orthopedics who agrees to perform the repair, however once the patient admitted to the hospitalist service. I've spoken with Dr. Carles Collet who will evaluate the patient and arrange for transfer.  Final Clinical Impressions(s) / ED Diagnoses   Final diagnoses:  Fall    New Prescriptions New Prescriptions   No medications on file     Veryl Speak, MD 11/30/16 1428

## 2016-11-30 NOTE — H&P (Signed)
History and Physical  Anthony Galvan GXQ:[id number] DOB: 1921/12/02 DOA: 11/30/2016   PCP: [name], MD   Patient coming from: Home  Chief Complaint: right hip pain  HPI:  Anthony Galvan is a 83 y.o. male with medical history of CAD status post BMS to LAD 2014, hypertension, hyperlipidemia and GI bleed in June 2018  presented after a mechanical fall resulting in right hip pain. The patient was working in his car port when he tripped over his own legs falling onto his right side. The patient had immediate pain and was unable to get up secondary to pain. EMS was activated. X-rays in the emergency department revealed a right femoral intertrochanteric fracture. The patient denied any syncope  or any prodromal type symptoms. He denied any dizziness, chest discomfort, shortness breath, nausea, vomiting.  In the emergency department, the patient was afebrile and hemodynamically stable saturating 94% on room air. BMP was unremarkable. He had a baseline creatinine of 1.21. CBC showed a white blood cell count of 14,700. Platelets was 151000.  Chest x-ray showed chronic interstitial scarring. There was concern of possible RUL opacity. EKG shows sinus rhythm with nonspecific T-wave changes. Orthopedics, Dr. Aline Brochure was consulted.  He felt that given the patient's cardiac history and risk for complications,  the patient will be better served having operative management at [facility].  Dr. Marcelino Scot, ortho at [facility] agreed to see patient in consult.  Assessment/Plan: Right femoral intertrochanteric fracture -Orthopedics, Dr. Aline Brochure was consulted -He felt that given the patient's cardiac history and risk for complications,  the patient will be better served having operative management at [facility].  -Dr. Altamese Goodhue agreed to see patient in consult -pain management -PT/OT after OR  Right upper lobe opacity  -I do not feel the patient has pneumonia clinically--he has no shortness breath, no  coughing, no fever, and no hypoxia  -remain off anbx and observe  Coronary artery disease with hx MI -History of  BMS to LAD 2014 -Continue metoprolol tartrate -Continue Imdur -Continue Lipitor and aspirin -10/23/16 LHC--severely calcified distal circumflex--decision made to treat medically  CKD stage III -Baseline creatinine 1.2-1.4 -Am BMP  Essential hypertension -Continue amlodipine, metoprolol tartrate  Hx GIB/erosive gastritis -10/08/16 -10/10/16 for GI bleed and EGD noting duodenal ulcers, moderate erosive gastritis/duodenitis       Past Medical History:  Diagnosis Date  . CAD S/P percutaneous coronary angioplasty 08/01/2012   PCI of mid LAD with a VeriFlex Bare Metal Stent 3.0 mm x 12 mm - post-dilated to 3.5 mm.  . Duodenal ulcer May 2011   on EGD  . GERD (gastroesophageal reflux disease)   . Helicobacter pylori gastritis MAY 2012 HP STOOL AG NEG  . HTN (hypertension)   . Hyperlipidemia   . MI (myocardial infarction) ([place]) 1976, 1980, [place] for at [facility] and [place] (Dr. Iona Beard)  . Osteoarthritis   . S/P colonoscopy 2011   normal, internal hemorrhoids   Past Surgical History:  Procedure Laterality Date  . CARDIAC CATHETERIZATION  01/30/2007   D1 75 %, mid LAD 50%, 50-70% circumflex, 50-70% RCA.(Dr. Adora Fridge)  . CARDIAC CATHETERIZATION  12/11/2001   normal L main, small RCA, dominant LL Cfx with mild diffuse disease, LAD with mid 10-20% stenosis, ramus intermedius with mild diffuse disease (Dr. Jackie Plum)  . CATARACT EXTRACTION    . COLONOSCOPY  OCT 2011 [place]  in setting of MI  . ESOPHAGOGASTRODUODENOSCOPY N/A 10/09/2016   Procedure: ESOPHAGOGASTRODUODENOSCOPY (EGD);  Surgeon: Danie Binder, MD;  Location: [facility];  Service: Endoscopy;  Laterality: N/A;  . LEFT HEART CATH AND CORONARY ANGIOGRAPHY N/A 10/23/2016   Procedure: Left Heart Cath and Coronary Angiography;  Surgeon: Sherren Mocha,  MD;  Location: [place] CV LAB;  Service: Cardiovascular;  Laterality: N/A;  . LEFT HEART CATHETERIZATION WITH CORONARY ANGIOGRAM N/A 08/01/2012   Procedure: LEFT HEART CATHETERIZATION WITH CORONARY ANGIOGRAM;  Surgeon: Leonie Man, MD;  Location: [facility] CATH LAB: Mid LAD 99% apple core; D2 ostial 70-80% (2 small for PCI) small nondominant RCA 60-70%. Distal circumflex/L PDA ~50% --> PCI of LAD  . NM MYOCAR PERF WALL MOTION  08/2003   adenosine stress - focal decreased perfusion defect in distal inferior wall, no significant ischemic changes  . PERCUTANEOUS STENT INTERVENTION  08/01/2012   Procedure: PERCUTANEOUS STENT INTERVENTION;  Surgeon: Leonie Man, MD;  Location: [facility] CATH LAB; ;PCI of mid LAD with a VeriFlex Bare Metal Stent 3.0 mm x 12 mm - post-dilated to 3.5 mm.  . TRANSTHORACIC ECHOCARDIOGRAM  April 2014   normal LV size and function. EF 60-65%. Grade 1 diastolic soft. Mild aortic valve calcification  . UPPER GASTROINTESTINAL ENDOSCOPY  MAY 2011 [place], HEMATEMESIS   DUODENAL ULCER, Bx: H PYLORI POS   Social History:  reports that he has never smoked. He has never used smokeless tobacco. He reports that he does not drink alcohol or use drugs.   Family History  Problem Relation Age of Onset  . Heart disease Mother   . Kidney disease Father   . Heart disease Brother 53  . Heart disease Sister   . Colon cancer Neg Hx      No Known Allergies   Prior to Admission medications   Medication Sig Start Date End Date Taking? Authorizing Provider  acetaminophen (TYLENOL) 325 MG tablet Take 650-975 mg by mouth 3 (three) times daily as needed (pain).    Yes [provider]  amLODipine (NORVASC) 5 MG tablet Take 1 tablet (5 mg total) by mouth daily. 10/24/16  Yes Cheryln Manly, NP  atorvastatin (LIPITOR) 80 MG tablet Take 1 tablet (80 mg total) by mouth daily at 6 PM. 08/02/12  Yes Brett Canales, PA-C  cetirizine (ZYRTEC) 5 MG tablet Take 5 mg by mouth daily.   Yes  [provider]  donepezil (ARICEPT) 10 MG tablet Take 5 mg by mouth daily.   Yes [provider]  isosorbide mononitrate (IMDUR) 30 MG 24 hr tablet Take 2 tablets (60 mg total) by mouth daily. 10/24/16  Yes Cheryln Manly, NP  metoprolol (LOPRESSOR) 50 MG tablet Take 50 mg by mouth 2 (two) times daily.   Yes [provider]  Multiple Vitamin (MULTIVITAMIN WITH MINERALS) TABS tablet Take 1 tablet by mouth daily.   Yes [provider]  NITROSTAT 0.4 MG SL tablet PLACE 1 TABLET UNDER TONGUE EVERY 5 MINUTES FOR 3 DOSES AS NEEDED. Patient taking differently: PLACE 1 TABLET UNDER TONGUE EVERY 5 MINUTES FOR 3 DOSES AS NEEDED FOR CHEST PAIN. 11/17/13  Yes Leonie Man, MD  pantoprazole (PROTONIX) 40 MG tablet Take 1 tablet (40 mg total) by mouth 2 (two) times daily before a meal. 10/10/16  Yes Robbie Lis, MD    Review of Systems:  Constitutional:  No weight loss, night sweats, Fevers, chills, fatigue.  Head&Eyes: No headache.  No vision loss.  No eye pain or scotoma ENT:  No Difficulty swallowing,Tooth/dental problems,Sore throat,  No ear ache, post nasal drip,  Cardio-vascular:  No chest pain, Orthopnea, PND, swelling in lower extremities,  dizziness, palpitations  GI:  No  abdominal pain, nausea, vomiting, diarrhea, loss of appetite, hematochezia, melena, heartburn, indigestion, Resp:  No shortness of breath with exertion or at rest. No cough. No coughing up of blood .No wheezing.No chest wall deformity  Skin:  no rash or lesions.  GU:  no dysuria, change in color of urine, no urgency or frequency. No flank pain.  Musculoskeletal:  C/o right hip pain. No decreased range of motion. No back pain.  Psych:  No change in mood or affect. No depression or anxiety. Neurologic: No headache, no dysesthesia, no focal weakness, no vision loss. No syncope  Physical Exam: Vitals:   11/30/16 1116 11/30/16 1300  BP: 138/79 117/68  Pulse: 78 74  Resp: 18  14  Temp: 98.5 F (36.9 C)   SpO2: 94% 93%  Weight: 77.1 kg (170 lb)   Height: 5\' 4"  (1.626 m)    General:  A&O x 3, NAD, nontoxic, pleasant/cooperative Head/Eye: No conjunctival hemorrhage, no icterus, [place]/AT, No nystagmus ENT:  No icterus,  No thrush, good dentition, no pharyngeal exudate Neck:  No masses, no lymphadenpathy, no bruits CV:  RRR, no rub, no gallop, no S3 Lung:  Bibasilar crackles. No wheezing. Abdomen: soft/NT, +BS, nondistended, no peritoneal signs Ext: No cyanosis, No rashes, No petechiae, No lymphangitis, trace LE edema Neuro: CNII-XII intact, strength 4/5 in bilateral upper and lower extremities, no dysmetria  Labs on Admission:  Basic Metabolic Panel:  Recent Labs Lab 11/30/16 1230  NA 137  K 4.8  CL 101  CO2 27  GLUCOSE 95  BUN 15  CREATININE 1.21  CALCIUM 8.8*   Liver Function Tests: No results for input(s): AST, ALT, ALKPHOS, BILITOT, PROT, ALBUMIN in the last 168 hours. No results for input(s): LIPASE, AMYLASE in the last 168 hours. No results for input(s): AMMONIA in the last 168 hours. CBC:  Recent Labs Lab 11/30/16 1230  WBC 14.7*  NEUTROABS 11.3*  HGB 12.9*  HCT 40.9  MCV 91.5  PLT 151   Coagulation Profile: No results for input(s): INR, PROTIME in the last 168 hours. Cardiac Enzymes: No results for input(s): CKTOTAL, CKMB, CKMBINDEX, TROPONINI in the last 168 hours. BNP: Invalid input(s): POCBNP CBG: No results for input(s): GLUCAP in the last 168 hours. Urine analysis:    Component Value Date/Time   COLORURINE YELLOW 10/08/2016 2000   APPEARANCEUR CLEAR 10/08/2016 2000   LABSPEC 1.017 10/08/2016 2000   PHURINE 5.0 10/08/2016 2000   GLUCOSEU NEGATIVE 10/08/2016 2000   HGBUR NEGATIVE 10/08/2016 2000   BILIRUBINUR NEGATIVE 10/08/2016 2000   KETONESUR 5 (A) 10/08/2016 2000   PROTEINUR NEGATIVE 10/08/2016 2000   UROBILINOGEN 1.0 08/27/2009 0830   NITRITE NEGATIVE 10/08/2016 2000   LEUKOCYTESUR NEGATIVE 10/08/2016 2000    Sepsis Labs: @LABRCNTIP (procalcitonin:4,lacticidven:4) )No results found for this or any previous visit (from the past 240 hour(s)).   Radiological Exams on Admission: Dg Chest 1 View  Result Date: 11/30/2016 CLINICAL DATA:  Right hip pain, fracture EXAM: CHEST 1 VIEW COMPARISON:  10/24/2016 FINDINGS: Heart is borderline in size. Chronic interstitial prominence throughout the lungs, likely chronic interstitial lung disease/ scarring. Patchy opacity in the inferior right upper lobe could reflect scarring or early infiltrate. No effusions. IMPRESSION: Areas of chronic interstitial prominence is scarring in the lungs. Inferior right upper lobe  scarring versus early infiltrate. Borderline heart size. Electronically Signed   By: Rolm Baptise M.D.   On: 11/30/2016 12:07   Dg Hip Unilat W Or Wo Pelvis 2-3 Views Right  Result Date: 11/30/2016 CLINICAL DATA:  Fall.  Right hip pain. EXAM: DG HIP (WITH OR WITHOUT PELVIS) 2-3V RIGHT COMPARISON:  None. FINDINGS: There is a right femoral intertrochanteric fracture with varus angulation. No subluxation or dislocation. Mild osteoarthritic changes in the hips bilaterally. SI joints are symmetric and unremarkable. IMPRESSION: Right femoral intertrochanteric fracture with mild varus angulation. Electronically Signed   By: Rolm Baptise M.D.   On: 11/30/2016 12:07    EKG: Independently reviewed. Sinus rhythm, nonspecific T-wave changes.    Time spent:60 minutes Code Status:   FULL Family Communication:  Sister updated at bedside Disposition Plan: expect 2-3 day hospitalization Consults called: ortho--Dr. Marcelino Scot DVT Prophylaxis: [place] Lovenox  Valyn Latchford, DO  [facility] Pager [phone]  If 7PM-7AM, please contact night-coverage [email or web address] Password [email or web address] 11/30/2016, 3:03 PM

## 2016-11-30 NOTE — ED Notes (Signed)
Pt started on 2L O2 due to sats dropping to 89-91% Now sats are 97%

## 2016-11-30 NOTE — ED Triage Notes (Signed)
Pt c/o right hip and leg pain since falling x 1 hour ago. Denies hitting head/loc. nad noted.

## 2016-11-30 NOTE — ED Notes (Signed)
Pt to xray

## 2016-11-30 NOTE — ED Notes (Signed)
Put pt on 2L O2. Sats dropping to 89-91%. Now Sats up to 97%

## 2016-11-30 NOTE — Progress Notes (Signed)
Full consult to follow.  Requires surgical repair which is planned for tomorrow.  Altamese Oasis, MD [facility] [phone] (p)

## 2016-12-01 ENCOUNTER — Inpatient Hospital Stay (HOSPITAL_COMMUNITY): Payer: Medicare Other

## 2016-12-01 ENCOUNTER — Inpatient Hospital Stay (HOSPITAL_COMMUNITY): Payer: Medicare Other | Admitting: Anesthesiology

## 2016-12-01 ENCOUNTER — Encounter (HOSPITAL_COMMUNITY)
Admission: EM | Disposition: A | Discharge status: Skilled Nursing Facility | Payer: Self-pay | Source: Home / Self Care | Attending: Family Medicine

## 2016-12-01 DIAGNOSIS — S72141A Displaced intertrochanteric fracture of right femur, initial encounter for closed fracture: Principal | ICD-10-CM

## 2016-12-01 HISTORY — PX: FEMUR IM NAIL: SHX1597

## 2016-12-01 LAB — SURGICAL PCR SCREEN
MRSA, PCR: NEGATIVE
Staphylococcus aureus: NEGATIVE

## 2016-12-01 LAB — CBC
HCT: 39.5 % (ref 39.0–52.0)
HEMOGLOBIN: 12.7 g/dL — AB (ref 13.0–17.0)
MCH: 28.6 pg (ref 26.0–34.0)
MCHC: 32.2 g/dL (ref 30.0–36.0)
MCV: 89 fL (ref 78.0–100.0)
Platelets: 128 10*3/uL — ABNORMAL LOW (ref 150–400)
RBC: 4.44 MIL/uL (ref 4.22–5.81)
RDW: 11.8 % (ref 11.5–15.5)
WBC: 11.7 10*3/uL — ABNORMAL HIGH (ref 4.0–10.5)

## 2016-12-01 LAB — TYPE AND SCREEN
ABO/RH(D): O POS
Antibody Screen: NEGATIVE

## 2016-12-01 LAB — ABO/RH: ABO/RH(D): O POS

## 2016-12-01 SURGERY — INSERTION, INTRAMEDULLARY ROD, FEMUR
Anesthesia: Spinal | Site: Hip | Laterality: Right

## 2016-12-01 MED ORDER — LACTATED RINGERS IV SOLN
INTRAVENOUS | Status: DC
  Administered 2016-12-01 (×2): via INTRAVENOUS

## 2016-12-01 MED ORDER — 0.9 % SODIUM CHLORIDE (POUR BTL) OPTIME
TOPICAL | Status: DC | PRN
  Administered 2016-12-01: 1000 mL

## 2016-12-01 MED ORDER — CEFAZOLIN SODIUM-DEXTROSE 1-4 GM/50ML-% IV SOLN
1.0000 g | Freq: Three times a day (TID) | INTRAVENOUS | Status: AC
  Administered 2016-12-02 (×2): 1 g via INTRAVENOUS
  Filled 2016-12-01 (×3): qty 50

## 2016-12-01 MED ORDER — FENTANYL CITRATE (PF) 100 MCG/2ML IJ SOLN
INTRAMUSCULAR | Status: DC | PRN
  Administered 2016-12-01: 50 ug via INTRAVENOUS

## 2016-12-01 MED ORDER — OXYCODONE HCL 5 MG PO TABS
5.0000 mg | ORAL_TABLET | Freq: Once | ORAL | Status: AC | PRN
  Administered 2016-12-01: 5 mg via ORAL

## 2016-12-01 MED ORDER — OXYCODONE HCL 5 MG/5ML PO SOLN: 5.0000 mg | Freq: Once | ORAL | Status: AC | PRN

## 2016-12-01 MED ORDER — PHENYLEPHRINE HCL 10 MG/ML IJ SOLN
INTRAMUSCULAR | Status: DC | PRN
  Administered 2016-12-01: 80 ug via INTRAVENOUS
  Administered 2016-12-01 (×3): 40 ug via INTRAVENOUS

## 2016-12-01 MED ORDER — FENTANYL CITRATE (PF) 250 MCG/5ML IJ SOLN
INTRAMUSCULAR | Status: AC
  Filled 2016-12-01: qty 5

## 2016-12-01 MED ORDER — PROPOFOL 10 MG/ML IV BOLUS
INTRAVENOUS | Status: AC
  Filled 2016-12-01: qty 20

## 2016-12-01 MED ORDER — CEFAZOLIN SODIUM-DEXTROSE 2-4 GM/100ML-% IV SOLN
INTRAVENOUS | Status: AC
  Filled 2016-12-01: qty 100

## 2016-12-01 MED ORDER — METOPROLOL TARTRATE 50 MG PO TABS
50.0000 mg | ORAL_TABLET | ORAL | Status: AC
  Administered 2016-12-01: 50 mg via ORAL
  Filled 2016-12-01: qty 1

## 2016-12-01 MED ORDER — CEFAZOLIN SODIUM-DEXTROSE 2-3 GM-% IV SOLR
INTRAVENOUS | Status: DC | PRN
  Administered 2016-12-01: 2 g via INTRAVENOUS

## 2016-12-01 MED ORDER — ENSURE ENLIVE PO LIQD
237.0000 mL | Freq: Two times a day (BID) | ORAL | Status: DC
  Administered 2016-12-02 – 2016-12-04 (×6): 237 mL via ORAL

## 2016-12-01 MED ORDER — BUPIVACAINE IN DEXTROSE 0.75-8.25 % IT SOLN
INTRATHECAL | Status: DC | PRN
  Administered 2016-12-01: 1.8 mL via INTRATHECAL

## 2016-12-01 MED ORDER — OXYCODONE HCL 5 MG PO TABS
ORAL_TABLET | ORAL | Status: AC
  Filled 2016-12-01: qty 1

## 2016-12-01 MED ORDER — ONDANSETRON HCL 4 MG/2ML IJ SOLN
4.0000 mg | Freq: Three times a day (TID) | INTRAMUSCULAR | Status: DC | PRN
  Administered 2016-12-01: 4 mg via INTRAVENOUS
  Filled 2016-12-01: qty 2

## 2016-12-01 MED ORDER — HYDROMORPHONE HCL 1 MG/ML IJ SOLN
INTRAMUSCULAR | Status: AC
  Administered 2016-12-01: 0.5 mg via INTRAVENOUS
  Filled 2016-12-01: qty 1

## 2016-12-01 MED ORDER — PROPOFOL 500 MG/50ML IV EMUL
INTRAVENOUS | Status: DC | PRN
  Administered 2016-12-01: 50 ug/kg/min via INTRAVENOUS

## 2016-12-01 MED ORDER — HYDROMORPHONE HCL 1 MG/ML IJ SOLN
0.2500 mg | INTRAMUSCULAR | Status: DC | PRN
  Administered 2016-12-01 (×2): 0.5 mg via INTRAVENOUS

## 2016-12-01 SURGICAL SUPPLY — 40 items
BIT DRILL 4.3MMS DISTAL GRDTED (BIT) IMPLANT
BRUSH SCRUB SURG 4.25 DISP (MISCELLANEOUS) ×6 IMPLANT
CATH FOLEY LATEX FREE 16FR (CATHETERS) ×3
CATH FOLEY LF 16FR (CATHETERS) IMPLANT
COVER PERINEAL POST (MISCELLANEOUS) ×3 IMPLANT
COVER SURGICAL LIGHT HANDLE (MISCELLANEOUS) ×4 IMPLANT
DRAPE C-ARMOR (DRAPES) ×3 IMPLANT
DRAPE ORTHO SPLIT 77X108 STRL (DRAPES) ×6
DRAPE SURG ORHT 6 SPLT 77X108 (DRAPES) ×2 IMPLANT
DRAPE U-SHAPE 47X51 STRL (DRAPES) ×3 IMPLANT
DRILL 4.3MMS DISTAL GRADUATED (BIT) ×3
DRSG MEPILEX BORDER 4X4 (GAUZE/BANDAGES/DRESSINGS) ×6 IMPLANT
ELECT REM PT RETURN 9FT ADLT (ELECTROSURGICAL) ×3
ELECTRODE REM PT RTRN 9FT ADLT (ELECTROSURGICAL) ×1 IMPLANT
GLOVE BIO SURGEON STRL SZ7.5 (GLOVE) ×3 IMPLANT
GLOVE BIO SURGEON STRL SZ8 (GLOVE) ×3 IMPLANT
GLOVE BIOGEL PI IND STRL 7.5 (GLOVE) ×1 IMPLANT
GLOVE BIOGEL PI IND STRL 8 (GLOVE) ×1 IMPLANT
GLOVE BIOGEL PI INDICATOR 7.5 (GLOVE) ×2
GLOVE BIOGEL PI INDICATOR 8 (GLOVE) ×2
GOWN STRL REUS W/ TWL LRG LVL3 (GOWN DISPOSABLE) ×2 IMPLANT
GOWN STRL REUS W/ TWL XL LVL3 (GOWN DISPOSABLE) ×1 IMPLANT
GOWN STRL REUS W/TWL LRG LVL3 (GOWN DISPOSABLE) ×6
GOWN STRL REUS W/TWL XL LVL3 (GOWN DISPOSABLE) ×3
GUIDEPIN 3.2X17.5 THRD DISP (PIN) ×2 IMPLANT
GUIDEWIRE BALL NOSE 100CM (WIRE) ×2 IMPLANT
HFN RH 130 DEG 9MM X 340MM (Nail) ×2 IMPLANT
HIP FRAC NAIL LAG SCR 10.5X100 (Orthopedic Implant) ×2 IMPLANT
KIT BASIN OR (CUSTOM PROCEDURE TRAY) ×3 IMPLANT
KIT ROOM TURNOVER OR (KITS) ×3 IMPLANT
MANIFOLD NEPTUNE II (INSTRUMENTS) ×3 IMPLANT
NS IRRIG 1000ML POUR BTL (IV SOLUTION) ×3 IMPLANT
PACK GENERAL/GYN (CUSTOM PROCEDURE TRAY) ×3 IMPLANT
PAD ARMBOARD 7.5X6 YLW CONV (MISCELLANEOUS) ×6 IMPLANT
SCREW BONE CORTICAL 5.0X38 (Screw) ×2 IMPLANT
SCREW CANN THRD AFF 10.5X100 (Orthopedic Implant) IMPLANT
STAPLER VISISTAT 35W (STAPLE) ×3 IMPLANT
SUT ETHILON 3 0 PS 1 (SUTURE) ×2 IMPLANT
TOWEL OR 17X24 6PK STRL BLUE (TOWEL DISPOSABLE) ×3 IMPLANT
TOWEL OR 17X26 10 PK STRL BLUE (TOWEL DISPOSABLE) ×6 IMPLANT

## 2016-12-01 NOTE — Discharge Instructions (Signed)
[facility] Discharge Instructions   General Discharge Instructions  WEIGHT BEARING STATUS: Weightbearing as tolerated Right Lower extremity   RANGE OF MOTION/ACTIVITY: range of motion as tolerated R hip and knee. Activity as tolerated. Ambulate with assistance  Wound Care: daily wound care starting immediately. See below Discharge Wound Care Instructions  Do NOT apply any ointments, solutions or lotions to pin sites or surgical wounds.  These prevent needed drainage and even though solutions like hydrogen peroxide kill bacteria, they also damage cells lining the pin sites that help fight infection.  Applying lotions or ointments can keep the wounds moist and can cause them to breakdown and open up as well. This can increase the risk for infection. When in doubt call the office.  Surgical incisions should be dressed daily.  If any drainage is noted, use one layer of adaptic, then gauze, Kerlix, and an ace wrap.  Once the incision is completely dry and without drainage, it may be left open to air out.  Showering may begin 36-48 hours later.  Cleaning gently with soap and water.  Traumatic wounds should be dressed daily as well.    One layer of adaptic, gauze, Kerlix, then ace wrap.  The adaptic can be discontinued once the draining has ceased    If you have a wet to dry dressing: wet the gauze with saline the squeeze as much saline out so the gauze is moist (not soaking wet), place moistened gauze over wound, then place a dry gauze over the moist one, followed by Kerlix wrap, then ace wrap.    DVT/PE prophylaxis:Lovenox x 21 days   Diet: as you were eating previously.  Can use over the counter stool softeners and bowel preparations, such as Miralax, to help with bowel movements.  Narcotics can be constipating.  Be sure to drink plenty of fluids  PAIN MEDICATION USE AND EXPECTATIONS  You have likely been given narcotic medications to help control your pain.  After a  traumatic event that results in an fracture (broken bone) with or without surgery, it is ok to use narcotic pain medications to help control one's pain.  We understand that everyone responds to pain differently and each individual patient will be evaluated on a regular basis for the continued need for narcotic medications. Ideally, narcotic medication use should last no more than 6-8 weeks (coinciding with fracture healing).   As a patient it is your responsibility as well to monitor narcotic medication use and report the amount and frequency you use these medications when you come to your office visit.   We would also advise that if you are using narcotic medications, you should take a dose prior to therapy to maximize you participation.  IF YOU ARE ON NARCOTIC MEDICATIONS IT IS NOT PERMISSIBLE TO OPERATE A MOTOR VEHICLE (MOTORCYCLE/CAR/TRUCK/MOPED) OR HEAVY MACHINERY DO NOT MIX NARCOTICS WITH OTHER CNS (CENTRAL NERVOUS SYSTEM) DEPRESSANTS SUCH AS ALCOHOL   STOP SMOKING OR USING NICOTINE PRODUCTS!!!!  As discussed nicotine severely impairs your body's ability to heal surgical and traumatic wounds but also impairs bone healing.  Wounds and bone heal by forming microscopic blood vessels (angiogenesis) and nicotine is a vasoconstrictor (essentially, shrinks blood vessels).  Therefore, if vasoconstriction occurs to these microscopic blood vessels they essentially disappear and are unable to deliver necessary nutrients to the healing tissue.  This is one modifiable factor that you can do to dramatically increase your chances of healing your injury.    (This means no smoking, no nicotine  gum, patches, etc)  DO NOT USE NONSTEROIDAL ANTI-INFLAMMATORY DRUGS (NSAID'S)  Using products such as Advil (ibuprofen), Aleve (naproxen), Motrin (ibuprofen) for additional pain control during fracture healing can delay and/or prevent the healing response.  If you would like to take over the counter (OTC) medication, Tylenol  (acetaminophen) is ok.  However, some narcotic medications that are given for pain control contain acetaminophen as well. Therefore, you should not exceed more than 4000 mg of tylenol in a day if you do not have liver disease.  Also note that there are may OTC medicines, such as cold medicines and allergy medicines that my contain tylenol as well.  If you have any questions about medications and/or interactions please ask your doctor/PA or your pharmacist.      ICE AND ELEVATE INJURED/OPERATIVE EXTREMITY  Using ice and elevating the injured extremity above your heart can help with swelling and pain control.  Icing in a pulsatile fashion, such as 20 minutes on and 20 minutes off, can be followed.    Do not place ice directly on skin. Make sure there is a barrier between to skin and the ice pack.    Using frozen items such as frozen peas works well as the conform nicely to the are that needs to be iced.  USE AN ACE WRAP OR TED HOSE FOR SWELLING CONTROL  In addition to icing and elevation, Ace wraps or TED hose are used to help limit and resolve swelling.  It is recommended to use Ace wraps or TED hose until you are informed to stop.    When using Ace Wraps start the wrapping distally (farthest away from the body) and wrap proximally (closer to the body)   Example: If you had surgery on your leg or thing and you do not have a splint on, start the ace wrap at the toes and work your way up to the thigh        If you had surgery on your upper extremity and do not have a splint on, start the ace wrap at your fingers and work your way up to the upper arm  IF YOU ARE IN A SPLINT OR CAST DO NOT [place]   If your splint gets wet for any reason please contact the office immediately. You may shower in your splint or cast as long as you keep it dry.  This can be done by wrapping in a cast cover or garbage back (or similar)  Do Not stick any thing down your splint or cast such as pencils, money, or  hangers to try and scratch yourself with.  If you feel itchy take benadryl as prescribed on the bottle for itching  IF YOU ARE IN A CAM BOOT (BLACK BOOT)  You may remove boot periodically. Perform daily dressing changes as noted below.  Wash the liner of the boot regularly and wear a sock when wearing the boot. It is recommended that you sleep in the boot until told otherwise  CALL THE OFFICE WITH ANY QUESTIONS OR CONCERNS: [phone]

## 2016-12-01 NOTE — Progress Notes (Signed)
Ready to transport pt back to 5n.  XRay at bedside. Will transport after Xrays are completed.

## 2016-12-01 NOTE — Anesthesia Postprocedure Evaluation (Signed)
Anesthesia Post Note  Patient: Anthony Galvan  Procedure(s) Performed: Procedure(s) (LRB): INTRAMEDULLARY (IM) NAIL RIGHT HIP (Right)     Patient location during evaluation: PACU Anesthesia Type: Spinal Level of consciousness: oriented and awake and alert Pain management: pain level controlled Vital Signs Assessment: post-procedure vital signs reviewed and stable Respiratory status: spontaneous breathing, respiratory function stable and patient connected to nasal cannula oxygen Cardiovascular status: blood pressure returned to baseline and stable Postop Assessment: no headache and no backache Anesthetic complications: no    Last Vitals:  Vitals:   12/01/16 1315 12/01/16 1330  BP: (!) 161/79   Pulse: 74 77  Resp: 19 17  Temp:    SpO2: 100% 96%    Last Pain:  Vitals:   12/01/16 1335  TempSrc:   PainSc: 0-No pain                 Karalyne Nusser,JAMES TERRILL

## 2016-12-01 NOTE — Progress Notes (Signed)
Initial Nutrition Assessment  DOCUMENTATION CODES:   Not applicable  INTERVENTION:  Once diet advances, provide Ensure Enlive po BID, each supplement provides 350 kcal and 20 grams of protein  NUTRITION DIAGNOSIS:   Increased nutrient needs related to  (post op healing) as evidenced by estimated needs.  GOAL:   Patient will meet greater than or equal to 90% of their needs  MONITOR:   Supplement acceptance, Labs, Weight trends, Diet advancement, Skin, I & O's  REASON FOR ASSESSMENT:   Consult Assessment of nutrition requirement/status  ASSESSMENT:   83 y.o. male with medical history of CAD status post BMS to LAD 2014, hypertension, hyperlipidemia and GI bleed in June 2018  presented after a mechanical fall resulting in right hip pain. X-rays in the emergency department revealed a right femoral intertrochanteric fracture.  Pt unavailable, currently in OR. RD to order nutritional supplements to aid in post op healing. Nursing staff to provide once diet advances. Unable to complete Nutrition-Focused physical exam at this time. RD to perform physical exam at next visit. Labs and medications reviewed.   Diet Order:  Diet NPO time specified  Skin:   (Incision to R hip)  Last BM:  8/17  Height:   Ht Readings from Last 1 Encounters:  12/01/16 5\' 4"  (1.626 m)    Weight:   Wt Readings from Last 1 Encounters:  12/01/16 164 lb (74.4 kg)    Ideal Body Weight:  54.5 kg  BMI:  Body mass index is 28.15 kg/m.  Estimated Nutritional Needs:   Kcal:  1600-1750  Protein:  65-80 grams  Fluid:  1.6 - 1.7 L/day  EDUCATION NEEDS:   No education needs identified at this time  Corrin Parker, MS, RD, LDN Pager # [phone] After hours/ weekend pager # [phone]

## 2016-12-01 NOTE — Progress Notes (Signed)
PROGRESS NOTE    Anthony Galvan  WIO:[id number] DOB: 1921/12/27 DOA: 11/30/2016 PCP: [name], MD    Brief Narrative:  83 y.o. male with medical history of CAD status post BMS to LAD 2014, hypertension, hyperlipidemia and GI bleed in June 2018  presented after a mechanical fall resulting in right hip pain. Subsequently developing a right femoral intertrochanteric fracture.   Assessment & Plan:    Closed disp intertrochanteric fracture of right femur with nonunion - Patient is status post day 0 for intramedullary nailing of right hip. - Continue supportive therapy - DVT recommendations per orthopedic surgeon - Physical therapy  Active Problems:   Hyperlipidemia with target low density lipoprotein (LDL) cholesterol less than 70 mg/dL - Stable on statin.    Essential hypertension - Continue amlodipine, Imdur,    CKD (chronic kidney disease) stage 3, GFR 30-59 ml/min - Stable currently.    CAD in native artery - Stable patient on statin. No chest pain reported.   DVT prophylaxis: Per orthopedic surgeon Code Status: Full Family Communication: Discussed with patient and family at bedside. Disposition Plan: Once cleared for discharge by physical therapy or orthopedic surgery   Consultants:   Orthopedic surgery    Procedures: none   Antimicrobials: Cefazolin   Subjective: Pt has no new complaints  Objective: Vitals:   12/01/16 1300 12/01/16 1315 12/01/16 1330 12/01/16 1428  BP: (!) 165/76 (!) 161/79  (!) 154/80  Pulse: 69 74 77 84  Resp: 13 19 17    Temp:    97.9 F (36.6 C)  TempSrc:    Oral  SpO2: 100% 100% 96% (!) 79%  Weight:      Height:        Intake/Output Summary (Last 24 hours) at 12/01/16 1821 Last data filed at 12/01/16 1151  Gross per 24 hour  Intake             2260 ml  Output             1561 ml  Net              699 ml   Filed Weights   11/30/16 1116 11/30/16 2100 12/01/16 0356  Weight: 77.1 kg (170 lb) 77.1 kg (170 lb) 74.4 kg  (164 lb)    Examination:  General exam: Appears calm and comfortable, in nad. Respiratory system: Clear to auscultation. Respiratory effort normal. No wheezes Cardiovascular system: S1 & S2 heard, RRR. No JVD, murmurs, rubs, gallops or clicks. No pedal edema. Gastrointestinal system: Abdomen is nondistended, soft and nontender. No organomegaly or masses felt. Normal bowel sounds heard. Central nervous system: Alert and oriented. No focal neurological deficits. Extremities: Symmetric 5 x 5 power. Skin: No rashes, lesions or ulcers, on limited exam. Psychiatry: Mood & affect appropriate.   Data Reviewed: I have personally reviewed following labs and imaging studies  CBC:  Recent Labs Lab 11/30/16 1230 12/01/16 0935  WBC 14.7* 11.7*  NEUTROABS 11.3*  --   HGB 12.9* 12.7*  HCT 40.9 39.5  MCV 91.5 89.0  PLT 151 426*   Basic Metabolic Panel:  Recent Labs Lab 11/30/16 1230  NA 137  K 4.8  CL 101  CO2 27  GLUCOSE 95  BUN 15  CREATININE 1.21  CALCIUM 8.8*   GFR: Estimated Creatinine Clearance: 36 mL/min (by C-G formula based on SCr of 1.21 mg/dL). Liver Function Tests: No results for input(s): AST, ALT, ALKPHOS, BILITOT, PROT, ALBUMIN in the last 168 hours. No results for input(s): LIPASE,  AMYLASE in the last 168 hours. No results for input(s): AMMONIA in the last 168 hours. Coagulation Profile:  Recent Labs Lab 11/30/16 1526  INR 0.96   Cardiac Enzymes: No results for input(s): CKTOTAL, CKMB, CKMBINDEX, TROPONINI in the last 168 hours. BNP (last 3 results) No results for input(s): PROBNP in the last 8760 hours. HbA1C: No results for input(s): HGBA1C in the last 72 hours. CBG: No results for input(s): GLUCAP in the last 168 hours. Lipid Profile: No results for input(s): CHOL, HDL, LDLCALC, TRIG, CHOLHDL, LDLDIRECT in the last 72 hours. Thyroid Function Tests: No results for input(s): TSH, T4TOTAL, FREET4, T3FREE, THYROIDAB in the last 72 hours. Anemia  Panel: No results for input(s): VITAMINB12, FOLATE, FERRITIN, TIBC, IRON, RETICCTPCT in the last 72 hours. Sepsis Labs: No results for input(s): PROCALCITON, LATICACIDVEN in the last 168 hours.  Recent Results (from the past 240 hour(s))  Surgical pcr screen     Status: None   Collection Time: 12/01/16  8:55 AM  Result Value Ref Range Status   MRSA, PCR NEGATIVE NEGATIVE Final   Staphylococcus aureus NEGATIVE NEGATIVE Final    Comment:        The Xpert SA Assay (FDA approved for NASAL specimens in patients over 96 years of age), is one component of a comprehensive surveillance program.  Test performance has been validated by [facility] for patients greater than or equal to 4 year old. It is not intended to diagnose infection nor to guide or monitor treatment.          Radiology Studies: Dg Chest 1 View  Result Date: 11/30/2016 CLINICAL DATA:  Right hip pain, fracture EXAM: CHEST 1 VIEW COMPARISON:  10/24/2016 FINDINGS: Heart is borderline in size. Chronic interstitial prominence throughout the lungs, likely chronic interstitial lung disease/ scarring. Patchy opacity in the inferior right upper lobe could reflect scarring or early infiltrate. No effusions. IMPRESSION: Areas of chronic interstitial prominence is scarring in the lungs. Inferior right upper lobe scarring versus early infiltrate. Borderline heart size. Electronically Signed   By: Rolm Baptise M.D.   On: 11/30/2016 12:07   Dg C-arm 1-60 Min  Result Date: 12/01/2016 CLINICAL DATA:  Right femur ORIF EXAM: RIGHT FEMUR 2 VIEWS; DG C-ARM 61-120 MIN COMPARISON:  Right hip x-rays dated November 30, 2016. FLUOROSCOPY TIME:  58 seconds. C-arm fluoroscopic images were obtained intraoperatively and submitted for post operative interpretation. FINDINGS: Multiple intraoperative x-rays demonstrate interval placement of a right femur cephalomedullary rod. Alignment is near anatomic. No definite hardware complication. IMPRESSION:  Intraoperative x-rays demonstrating interval right femur cephalomedullary rod placement. Electronically Signed   By: Titus Dubin M.D.   On: 12/01/2016 11:39   Dg Hip Unilat W Or Wo Pelvis 2-3 Views Right  Result Date: 11/30/2016 CLINICAL DATA:  Fall.  Right hip pain. EXAM: DG HIP (WITH OR WITHOUT PELVIS) 2-3V RIGHT COMPARISON:  None. FINDINGS: There is a right femoral intertrochanteric fracture with varus angulation. No subluxation or dislocation. Mild osteoarthritic changes in the hips bilaterally. SI joints are symmetric and unremarkable. IMPRESSION: Right femoral intertrochanteric fracture with mild varus angulation. Electronically Signed   By: Rolm Baptise M.D.   On: 11/30/2016 12:07   Dg Femur, Min 2 Views Right  Result Date: 12/01/2016 CLINICAL DATA:  Right femur ORIF EXAM: RIGHT FEMUR 2 VIEWS; DG C-ARM 61-120 MIN COMPARISON:  Right hip x-rays dated November 30, 2016. FLUOROSCOPY TIME:  58 seconds. C-arm fluoroscopic images were obtained intraoperatively and submitted for post operative interpretation. FINDINGS: Multiple  intraoperative x-rays demonstrate interval placement of a right femur cephalomedullary rod. Alignment is near anatomic. No definite hardware complication. IMPRESSION: Intraoperative x-rays demonstrating interval right femur cephalomedullary rod placement. Electronically Signed   By: Titus Dubin M.D.   On: 12/01/2016 11:39   Dg Femur Port, Min 2 Views Right  Result Date: 12/01/2016 CLINICAL DATA:  Intramedullary nail. EXAM: RIGHT FEMUR PORTABLE 2 VIEW COMPARISON:  12/01/2016. FINDINGS: Intramedullary rod noted traversing the right femur with right hip screw. Anatomic alignment. Hardware intact. Peripheral vascular calcification. IMPRESSION: Postsurgical changes right femur. Electronically Signed   By: Draper   On: 12/01/2016 13:55        Scheduled Meds: . amLODipine  5 mg Oral Daily  . atorvastatin  80 mg Oral q1800  . donepezil  5 mg Oral Daily  .  enoxaparin (LOVENOX) injection  40 mg Subcutaneous Q24H  . [START ON 12/02/2016] feeding supplement (ENSURE ENLIVE)  237 mL Oral BID BM  . isosorbide mononitrate  60 mg Oral Daily  . loratadine  10 mg Oral Daily  . metoprolol tartrate  50 mg Oral BID  . multivitamin with minerals  1 tablet Oral Daily  . oxyCODONE      . pantoprazole  40 mg Oral BID AC   Continuous Infusions: . ceFAZolin    .  ceFAZolin (ANCEF) IV    . lactated ringers 10 mL/hr at 12/01/16 0925     LOS: 1 day    Time spent: > 25 min  Velvet Bathe, MD [facility] Pager [phone]  If 7PM-7AM, please contact night-coverage [email or web address] Password [facility] 12/01/2016, 6:21 PM

## 2016-12-01 NOTE — Anesthesia Preprocedure Evaluation (Addendum)
Anesthesia Evaluation  Patient identified by MRN, date of birth, ID band Patient awake    Reviewed: Allergy & Precautions, NPO status , Patient's Chart, lab work & pertinent test results  Airway Mallampati: II  TM Distance: >3 FB Neck ROM: Full    Dental  (+) Missing, Poor Dentition, Dental Advisory Given   Pulmonary neg pulmonary ROS,    breath sounds clear to auscultation       Cardiovascular hypertension, + angina with exertion + CAD and + Past MI   Rhythm:Regular Rate:Normal     Neuro/Psych  Neuromuscular disease    GI/Hepatic PUD, GERD  ,  Endo/Other    Renal/GU Renal disease     Musculoskeletal  (+) Arthritis ,   Abdominal (+) + obese,   Peds  Hematology   Anesthesia Other Findings   Reproductive/Obstetrics                            Anesthesia Physical Anesthesia Plan  ASA: III  Anesthesia Plan: Spinal   Post-op Pain Management:    Induction:   PONV Risk Score and Plan: 2 and Ondansetron and Dexamethasone  Airway Management Planned: Natural Airway and Simple Face Mask  Additional Equipment:   Intra-op Plan:   Post-operative Plan:   Informed Consent:   Plan Discussed with: CRNA  Anesthesia Plan Comments:         Anesthesia Quick Evaluation

## 2016-12-01 NOTE — Anesthesia Procedure Notes (Signed)
Procedure Name: MAC Performed by: Valda Favia Pre-anesthesia Checklist: Patient identified, Emergency Drugs available, Suction available, Timeout performed and Patient being monitored Patient Re-evaluated:Patient Re-evaluated prior to induction Oxygen Delivery Method: Nasal cannula Number of attempts: 1 Placement Confirmation: positive ETCO2 Dental Injury: Teeth and Oropharynx as per pre-operative assessment

## 2016-12-01 NOTE — OR Nursing (Signed)
Latex free uretheral catheterization performed at the end of the case, urine drained (350 ml), and catheter removed.

## 2016-12-01 NOTE — Transfer of Care (Signed)
Immediate Anesthesia Transfer of Care Note  Patient: Anthony Galvan  Procedure(s) Performed: Procedure(s): INTRAMEDULLARY (IM) NAIL RIGHT HIP (Right)  Patient Location: PACU  Anesthesia Type:Spinal  Level of Consciousness: awake and patient cooperative  Airway & Oxygen Therapy: Patient Spontanous Breathing and Patient connected to nasal cannula oxygen  Post-op Assessment: Report given to RN and Post -op Vital signs reviewed and stable  Post vital signs: Reviewed and stable  Last Vitals:  Vitals:   12/01/16 0356 12/01/16 0907  BP: (!) 125/58 (!) 143/63  Pulse: 64 68  Resp: 16 16  Temp: 36.7 C   SpO2: 100% 94%    Last Pain:  Vitals:   12/01/16 0907  TempSrc:   PainSc: 0-No pain      Patients Stated Pain Goal: 3 (44/46/19 0122)  Complications: No apparent anesthesia complications

## 2016-12-01 NOTE — Anesthesia Procedure Notes (Signed)
Spinal  Patient location during procedure: OR Start time: 12/01/2016 10:55 AM End time: 12/01/2016 10:08 AM Staffing Anesthesiologist: Rica Koyanagi Performed: anesthesiologist  Preanesthetic Checklist Completed: patient identified, site marked, surgical consent, pre-op evaluation, timeout performed, IV checked and monitors and equipment checked Spinal Block Patient position: right lateral decubitus Prep: ChloraPrep Patient monitoring: cardiac monitor, continuous pulse ox and blood pressure Approach: right paramedian Location: L3-4 Injection technique: single-shot Needle Needle type: Quincke  Needle length: 9 cm Needle insertion depth: 5 cm Assessment Sensory level: T6

## 2016-12-01 NOTE — Consult Note (Signed)
Orthopaedic Trauma Service (OTS) Consult   Patient ID: Anthony Galvan MRN: [id number] DOB/AGE: Feb 02, 1922 83 y.o.   Reason for Consult: R hip fracture  Referring Physician:  Orson Eva, MD (internal medicine)   HPI: Anthony Galvan is an 83 y.o. black male who sustained a ground-level fall yesterday morning while at home. Patient was making breakfast in his carport as he normally does every morning. He was attempting to chair out of the way when he tripped over the chair and fell onto his right hip. Patient had immediate onset of pain and ability to bear weight. He was initially brought to [facility] however due to his cardiac history they requested transfer to [place] for definitive treatment even though patient did not have any active cardiopulmonary symptoms. It was felt that he would be better served at an institution with better access to additional resources. Patient wasn't transferred to [facility] until approximately 8:00 last night. Patient seen and evaluated this morning. He is quite comfortable. Only complains of pain in his right hip. Denies pain in his right knee. Denies any numbness or tingling. Patient lives at home with his grandson. It Is a single level dwelling with 1 stair to gain entrance to his house. He does use a cane and walker at baseline. He does not have a history of frequent falls. He is pretty cognizant of his balance issues.  Patient had been pleasant individual. He is a retired Insurance underwriter. Has Never Smoked. Does not drink or do other drugs.  Cardiologist is Dr. Ellyn Hack  The patient did have a left heart catheterization on 10/23/2016. Two-vessel CAD. PCI was not performed due to severe calcifications.  Catheterization prior to this was done in 2014  Patient has been able to void while in the hospital. Last bowel movement was the morning of his fall which was 11/30/2016      Past Medical History:  Diagnosis Date  . CAD S/P percutaneous coronary angioplasty  08/01/2012   PCI of mid LAD with a VeriFlex Bare Metal Stent 3.0 mm x 12 mm - post-dilated to 3.5 mm.  . Duodenal ulcer May 2011   on EGD  . GERD (gastroesophageal reflux disease)   . Helicobacter pylori gastritis MAY 2012 HP STOOL AG NEG  . HTN (hypertension)   . Hyperlipidemia   . MI (myocardial infarction) ([place]) 1976, 1980, [place] for at [facility] and [place] (Dr. Iona Beard)  . Osteoarthritis   . S/P colonoscopy 2011   normal, internal hemorrhoids    Past Surgical History:  Procedure Laterality Date  . CARDIAC CATHETERIZATION  01/30/2007   D1 75 %, mid LAD 50%, 50-70% circumflex, 50-70% RCA.(Dr. Adora Fridge)  . CARDIAC CATHETERIZATION  12/11/2001   normal L main, small RCA, dominant LL Cfx with mild diffuse disease, LAD with mid 10-20% stenosis, ramus intermedius with mild diffuse disease (Dr. Jackie Plum)  . CATARACT EXTRACTION    . COLONOSCOPY  OCT 2011 ARS   [facility] [place]   in setting of MI  . ESOPHAGOGASTRODUODENOSCOPY N/A 10/09/2016   Procedure: ESOPHAGOGASTRODUODENOSCOPY (EGD);  Surgeon: Danie Binder, MD;  Location[facility];  Service: Endoscopy;  Laterality: N/A;  . LEFT HEART CATH AND CORONARY ANGIOGRAPHY N/A 10/23/2016   Procedure: Left Heart Cath and Coronary Angiography;  Surgeon: Sherren Mocha, MD;  Location: [place] CV LAB;  Service: Cardiovascular;  Laterality: N/A;  . LEFT HEART CATHETERIZATION WITH CORONARY ANGIOGRAM N/A  08/01/2012   Procedure: LEFT HEART CATHETERIZATION WITH CORONARY ANGIOGRAM;  Surgeon: Leonie Man, MD;  Location: [facility] CATH LAB: Mid LAD 99% apple core; D2 ostial 70-80% (2 small for PCI) small nondominant RCA 60-70%. Distal circumflex/L PDA ~50% --> PCI of LAD  . NM MYOCAR PERF WALL MOTION  08/2003   adenosine stress - focal decreased perfusion defect in distal inferior wall, no significant ischemic changes  . PERCUTANEOUS STENT INTERVENTION  08/01/2012   Procedure: PERCUTANEOUS STENT INTERVENTION;   Surgeon: Leonie Man, MD;  Location: [facility] CATH LAB; ;PCI of mid LAD with a VeriFlex Bare Metal Stent 3.0 mm x 12 mm - post-dilated to 3.5 mm.  . TRANSTHORACIC ECHOCARDIOGRAM  April 2014   normal LV size and function. EF 60-65%. Grade 1 diastolic soft. Mild aortic valve calcification  . UPPER GASTROINTESTINAL ENDOSCOPY  MAY 2011 MELE[place], HEMATEMESIS   DUODENAL ULCER, Bx: H PYLORI POS    Family History  Problem Relation Age of Onset  . Heart disease Mother   . Kidney disease Father   . Heart disease Brother 55  . Heart disease Sister   . Colon cancer Neg Hx     Social History:  reports that he has never smoked. He has never used smokeless tobacco. He reports that he does not drink alcohol or use drugs.  Allergies: No Known Allergies  Medications:  I have reviewed the patient's current medications. Prior to Admission:  Prescriptions Prior to Admission  Medication Sig Dispense Refill Last Dose  . acetaminophen (TYLENOL) 325 MG tablet Take 650-975 mg by mouth 3 (three) times daily as needed (pain).    11/29/2016 at Unknown time  . amLODipine (NORVASC) 5 MG tablet Take 1 tablet (5 mg total) by mouth daily. 90 tablet 1 11/30/2016 at Unknown time  . atorvastatin (LIPITOR) 80 MG tablet Take 1 tablet (80 mg total) by mouth daily at 6 PM. 30 tablet 5 11/30/2016 at Unknown time  . cetirizine (ZYRTEC) 5 MG tablet Take 5 mg by mouth daily.   11/30/2016 at Unknown time  . donepezil (ARICEPT) 10 MG tablet Take 5 mg by mouth daily.   11/30/2016 at Unknown time  . isosorbide mononitrate (IMDUR) 30 MG 24 hr tablet Take 2 tablets (60 mg total) by mouth daily. 60 tablet 2 11/29/2016 at Unknown time  . metoprolol (LOPRESSOR) 50 MG tablet Take 50 mg by mouth 2 (two) times daily.   11/30/2016 at 8:30 am  . Multiple Vitamin (MULTIVITAMIN WITH MINERALS) TABS tablet Take 1 tablet by mouth daily.   11/30/2016 at Unknown time  . NITROSTAT 0.4 MG SL tablet PLACE 1 TABLET UNDER TONGUE EVERY 5 MINUTES FOR 3 DOSES AS  NEEDED. (Patient taking differently: PLACE 1 TABLET UNDER TONGUE EVERY 5 MINUTES FOR 3 DOSES AS NEEDED FOR CHEST PAIN.) 25 tablet 6 unknown at unknown  . pantoprazole (PROTONIX) 40 MG tablet Take 1 tablet (40 mg total) by mouth 2 (two) times daily before a meal. 60 tablet 3 11/30/2016 at Unknown time    Results for orders placed or performed during the hospital encounter of 11/30/16 (from the past 48 hour(s))  Basic metabolic panel     Status: Abnormal   Collection Time: 11/30/16 12:30 PM  Result Value Ref Range   Sodium 137 135 - 145 mmol/L   Potassium 4.8 3.5 - 5.1 mmol/L   Chloride 101 101 - 111 mmol/L   CO2 27 22 - 32 mmol/L   Glucose, Bld 95 65 - 99 mg/dL  BUN 15 6 - 20 mg/dL   Creatinine, Ser 1.21 0.61 - 1.24 mg/dL   Calcium 8.8 (L) 8.9 - 10.3 mg/dL   GFR calc non Af Amer 50 (L) >60 mL/min   GFR calc Af Amer 58 (L) >60 mL/min    Comment: (NOTE) The eGFR has been calculated using the CKD EPI equation. This calculation has not been validated in all clinical situations. eGFR's persistently <60 mL/min signify possible Chronic Kidney Disease.    Anion gap 9 5 - 15  CBC with Differential     Status: Abnormal   Collection Time: 11/30/16 12:30 PM  Result Value Ref Range   WBC 14.7 (H) 4.0 - 10.5 K/uL   RBC 4.47 4.22 - 5.81 MIL/uL   Hemoglobin 12.9 (L) 13.0 - 17.0 g/dL   HCT 40.9 39.0 - 52.0 %   MCV 91.5 78.0 - 100.0 fL   MCH 28.9 26.0 - 34.0 pg   MCHC 31.5 30.0 - 36.0 g/dL   RDW 11.9 11.5 - 15.5 %   Platelets 151 150 - 400 K/uL   Neutrophils Relative % 77 %   Neutro Abs 11.3 (H) 1.7 - 7.7 K/uL   Lymphocytes Relative 14 %   Lymphs Abs 2.1 0.7 - 4.0 K/uL   Monocytes Relative 7 %   Monocytes Absolute 1.1 (H) 0.1 - 1.0 K/uL   Eosinophils Relative 2 %   Eosinophils Absolute 0.2 0.0 - 0.7 K/uL   Basophils Relative 0 %   Basophils Absolute 0.1 0.0 - 0.1 K/uL  Protime-INR     Status: None   Collection Time: 11/30/16  3:26 PM  Result Value Ref Range   Prothrombin Time 12.8 11.4  - 15.2 seconds   INR 0.96     Dg Chest 1 View  Result Date: 11/30/2016 CLINICAL DATA:  Right hip pain, fracture EXAM: CHEST 1 VIEW COMPARISON:  10/24/2016 FINDINGS: Heart is borderline in size. Chronic interstitial prominence throughout the lungs, likely chronic interstitial lung disease/ scarring. Patchy opacity in the inferior right upper lobe could reflect scarring or early infiltrate. No effusions. IMPRESSION: Areas of chronic interstitial prominence is scarring in the lungs. Inferior right upper lobe scarring versus early infiltrate. Borderline heart size. Electronically Signed   By: Rolm Baptise M.D.   On: 11/30/2016 12:07   Dg Hip Unilat W Or Wo Pelvis 2-3 Views Right  Result Date: 11/30/2016 CLINICAL DATA:  Fall.  Right hip pain. EXAM: DG HIP (WITH OR WITHOUT PELVIS) 2-3V RIGHT COMPARISON:  None. FINDINGS: There is a right femoral intertrochanteric fracture with varus angulation. No subluxation or dislocation. Mild osteoarthritic changes in the hips bilaterally. SI joints are symmetric and unremarkable. IMPRESSION: Right femoral intertrochanteric fracture with mild varus angulation. Electronically Signed   By: Rolm Baptise M.D.   On: 11/30/2016 12:07    Review of Systems  Constitutional: Negative for chills and fever.  Eyes: Negative for blurred vision.  Cardiovascular: Negative for chest pain and palpitations.  Gastrointestinal: Negative for nausea and vomiting.  Neurological: Negative for tingling and sensory change.   Blood pressure (!) 120/51, pulse 77, temperature 98.3 F (36.8 C), temperature source Oral, resp. rate 17, height '5\' 4"'  (1.626 m), weight 77.1 kg (170 lb), SpO2 98 %. Physical Exam  Constitutional: He is cooperative.  Pleasant black male, no acute distress  Cardiovascular:  S1 and S2, regular  Pulmonary/Chest: No respiratory distress.  Clear anterior fields  Abdominal: Normal appearance and bowel sounds are normal. There is no tenderness.  Musculoskeletal:  Pelvis--no traumatic wounds or rash, no ecchymosis, stable to manual stress, nontender  Right Lower Extremity  Inspection:    Right leg is shortened flexed and externally rotated    No open wounds or lesions noted    No knee effusion or ankle effusion appreciated Bony eval:    Profound tenderness in the right hip    Right knee and ankle are nontender Soft tissue:    No knee or ankle effusion    Soft tissue around right hip is stable. No open wounds or lesions ROM:    Good ankle range of motion noted    Did not assess knee or hip range of motion Sensation:    DPN, SPN, TN sensory functions are grossly intact Motor:    EHL, FHL and lesser toe motor functions are intact    Ankle flexion and extension are intact    Inversion and eversion intact Vascular:    + DP pulse     Extremity is warm       Neurological: He is alert.     Assessment/Plan:  83 year old black male ground-level fall with comminuted right intertrochanteric femur fracture  - Comminuted right intertrochanteric femur fracture  OR for intramedullary nailing of right hip  We will likely allow weightbearing as tolerated postoperatively. Patient will continue with walker postoperatively  No additional cardiac interventions/workup preoperatively as patient is on medical management for his CAD  Patient may need a skilled nursing center after discharge  No range of motion restrictions   - Pain management:  Minimize narcs  Tylenol  Will do IV tylenol for 24 hours post op    - ABL anemia/Hemodynamics  Check H/H now  Pt with cardiac disease.   - Medical issues   Per medical service   - DVT/PE prophylaxis:  Recommend lovenox x 21 days   - ID:   periop abx   - Metabolic Bone Disease:  Osteoporosis   - Dispo:  OR for IMN R hip    Anthony Pigg, PA-C [facility] [phone] (P) 12/01/2016, 12:35 AM

## 2016-12-02 NOTE — Progress Notes (Signed)
PROGRESS NOTE    Anthony Galvan  KCL:[id number] DOB: 12/16/1921 DOA: 11/30/2016 PCP: [name], MD    Brief Narrative:  83 y.o. male with medical history of CAD status post BMS to LAD 2014, hypertension, hyperlipidemia and GI bleed in June 2018  presented after a mechanical fall resulting in right hip pain. Subsequently developing a right femoral intertrochanteric fracture.   Assessment & Plan:    Closed disp intertrochanteric fracture of right femur with nonunion - Patient is status post day 1 for intramedullary nailing of right hip. - Continue supportive therapy - DVT recommendations per orthopedic surgeon - Physical therapy  Active Problems:   Hyperlipidemia with target low density lipoprotein (LDL) cholesterol less than 70 mg/dL - Continue patient on statin.    Essential hypertension - Continue amlodipine, Imdur,    CKD (chronic kidney disease) stage 3, GFR 30-59 ml/min - Stable currently.    CAD in native artery - Stable patient on statin. No chest pain reported.   DVT prophylaxis: Per orthopedic surgeon Code Status: Full Family Communication: Discussed with patient and family at bedside. Disposition Plan: Once cleared for discharge by physical therapy or orthopedic surgery   Consultants:   Orthopedic surgery    Procedures: none   Antimicrobials: Cefazolin   Subjective: No new complaints reported.  Objective: Vitals:   12/01/16 1428 12/01/16 2100 12/02/16 0300 12/02/16 0823  BP: (!) 154/80 (!) 120/56 (!) 127/50 (!) 121/52  Pulse: 84 74 73 81  Resp:  18 18   Temp: 97.9 F (36.6 C) 98.5 F (36.9 C) 98.2 F (36.8 C)   TempSrc: Oral Oral Oral   SpO2: (!) 79% 98% 98% 100%  Weight:      Height:        Intake/Output Summary (Last 24 hours) at 12/02/16 1537 Last data filed at 12/02/16 4944  Gross per 24 hour  Intake              600 ml  Output              800 ml  Net             -200 ml   Filed Weights   11/30/16 1116 11/30/16 2100  12/01/16 0356  Weight: 77.1 kg (170 lb) 77.1 kg (170 lb) 74.4 kg (164 lb)    Examination:Exam unchanged when compared to 12/01/2016  General exam: Appears calm and comfortable, in nad. Respiratory system: Clear to auscultation. Respiratory effort normal. No wheezes Cardiovascular system: S1 & S2 heard, RRR. No JVD, murmurs, rubs, gallops or clicks. No pedal edema. Gastrointestinal system: Abdomen is nondistended, soft and nontender. No organomegaly or masses felt. Normal bowel sounds heard. Central nervous system: Alert and oriented. No focal neurological deficits. Extremities: Symmetric 5 x 5 power. Skin: No rashes, lesions or ulcers, on limited exam. Psychiatry: Mood & affect appropriate.   Data Reviewed: I have personally reviewed following labs and imaging studies  CBC:  Recent Labs Lab 11/30/16 1230 12/01/16 0935  WBC 14.7* 11.7*  NEUTROABS 11.3*  --   HGB 12.9* 12.7*  HCT 40.9 39.5  MCV 91.5 89.0  PLT 151 967*   Basic Metabolic Panel:  Recent Labs Lab 11/30/16 1230  NA 137  K 4.8  CL 101  CO2 27  GLUCOSE 95  BUN 15  CREATININE 1.21  CALCIUM 8.8*   GFR: Estimated Creatinine Clearance: 36 mL/min (by C-G formula based on SCr of 1.21 mg/dL). Liver Function Tests: No results for input(s): AST, ALT, ALKPHOS,  BILITOT, PROT, ALBUMIN in the last 168 hours. No results for input(s): LIPASE, AMYLASE in the last 168 hours. No results for input(s): AMMONIA in the last 168 hours. Coagulation Profile:  Recent Labs Lab 11/30/16 1526  INR 0.96   Cardiac Enzymes: No results for input(s): CKTOTAL, CKMB, CKMBINDEX, TROPONINI in the last 168 hours. BNP (last 3 results) No results for input(s): PROBNP in the last 8760 hours. HbA1C: No results for input(s): HGBA1C in the last 72 hours. CBG: No results for input(s): GLUCAP in the last 168 hours. Lipid Profile: No results for input(s): CHOL, HDL, LDLCALC, TRIG, CHOLHDL, LDLDIRECT in the last 72 hours. Thyroid Function  Tests: No results for input(s): TSH, T4TOTAL, FREET4, T3FREE, THYROIDAB in the last 72 hours. Anemia Panel: No results for input(s): VITAMINB12, FOLATE, FERRITIN, TIBC, IRON, RETICCTPCT in the last 72 hours. Sepsis Labs: No results for input(s): PROCALCITON, LATICACIDVEN in the last 168 hours.  Recent Results (from the past 240 hour(s))  Surgical pcr screen     Status: None   Collection Time: 12/01/16  8:55 AM  Result Value Ref Range Status   MRSA, PCR NEGATIVE NEGATIVE Final   Staphylococcus aureus NEGATIVE NEGATIVE Final    Comment:        The Xpert SA Assay (FDA approved for NASAL specimens in patients over 72 years of age), is one component of a comprehensive surveillance program.  Test performance has been validated by [facility] for patients greater than or equal to 53 year old. It is not intended to diagnose infection nor to guide or monitor treatment.          Radiology Studies: Dg C-arm 1-60 Min  Result Date: 12/01/2016 CLINICAL DATA:  Right femur ORIF EXAM: RIGHT FEMUR 2 VIEWS; DG C-ARM 61-120 MIN COMPARISON:  Right hip x-rays dated November 30, 2016. FLUOROSCOPY TIME:  58 seconds. C-arm fluoroscopic images were obtained intraoperatively and submitted for post operative interpretation. FINDINGS: Multiple intraoperative x-rays demonstrate interval placement of a right femur cephalomedullary rod. Alignment is near anatomic. No definite hardware complication. IMPRESSION: Intraoperative x-rays demonstrating interval right femur cephalomedullary rod placement. Electronically Signed   By: Titus Dubin M.D.   On: 12/01/2016 11:39   Dg Femur, Min 2 Views Right  Result Date: 12/01/2016 CLINICAL DATA:  Right femur ORIF EXAM: RIGHT FEMUR 2 VIEWS; DG C-ARM 61-120 MIN COMPARISON:  Right hip x-rays dated November 30, 2016. FLUOROSCOPY TIME:  58 seconds. C-arm fluoroscopic images were obtained intraoperatively and submitted for post operative interpretation. FINDINGS: Multiple  intraoperative x-rays demonstrate interval placement of a right femur cephalomedullary rod. Alignment is near anatomic. No definite hardware complication. IMPRESSION: Intraoperative x-rays demonstrating interval right femur cephalomedullary rod placement. Electronically Signed   By: Titus Dubin M.D.   On: 12/01/2016 11:39   Dg Femur Port, Min 2 Views Right  Result Date: 12/01/2016 CLINICAL DATA:  Intramedullary nail. EXAM: RIGHT FEMUR PORTABLE 2 VIEW COMPARISON:  12/01/2016. FINDINGS: Intramedullary rod noted traversing the right femur with right hip screw. Anatomic alignment. Hardware intact. Peripheral vascular calcification. IMPRESSION: Postsurgical changes right femur. Electronically Signed   By: [place]   On: 12/01/2016 13:55        Scheduled Meds: . amLODipine  5 mg Oral Daily  . atorvastatin  80 mg Oral q1800  . donepezil  5 mg Oral Daily  . enoxaparin (LOVENOX) injection  40 mg Subcutaneous Q24H  . feeding supplement (ENSURE ENLIVE)  237 mL Oral BID BM  . isosorbide mononitrate  60 mg  Oral Daily  . loratadine  10 mg Oral Daily  . metoprolol tartrate  50 mg Oral BID  . multivitamin with minerals  1 tablet Oral Daily  . pantoprazole  40 mg Oral BID AC   Continuous Infusions: .  ceFAZolin (ANCEF) IV Stopped (12/02/16 0855)  . lactated ringers 10 mL/hr at 12/01/16 0925     LOS: 2 days    Time spent: > 25 min  Velvet Bathe, MD [facility] Pager [phone]  If 7PM-7AM, please contact night-coverage [email or web address] Password [facility] 12/02/2016, 3:37 PM

## 2016-12-02 NOTE — Plan of Care (Signed)
Problem: Pain Managment: Goal: General experience of comfort will improve Patient voices understanding of the pain scale and calls for pain medication when needed

## 2016-12-02 NOTE — Evaluation (Signed)
Physical Therapy Evaluation Patient Details Name: Anthony Galvan MRN: [id number] DOB: 1922/03/27 Today's Date: 12/02/2016   History of Present Illness  Patient was lifting a chair at home whn he tanlged his feet, fell, and had suffered a right femur fracture. He had a right IM rod placed on 12/01/2016. PMH:  OA CAD, Past MI;   Clinical Impression  Patient required mod a for bed mobility and mod a to sit safely in a chair. He would benefit from rehab at a NSF to increase his safety at home with transfers. He has assistance at home but will likely require 24 hour assistance for all ADL's and IADL's.  Acute therapy will continue to follow him.     Follow Up Recommendations SNF    Equipment Recommendations  None recommended by PT (follow up on equipment at [facility])    Recommendations for Other Services Rehab consult     Precautions / Restrictions Precautions Precautions: Fall Restrictions Weight Bearing Restrictions: Yes RLE Weight Bearing: Weight bearing as tolerated      Mobility  Bed Mobility Overal bed mobility: Needs Assistance Bed Mobility: Supine to Sit     Supine to sit: +2 for physical assistance;Mod assist     General bed mobility comments: mod a to get hips to the edge of the bed and mod a +2 to sit up at the edge of the bed.   Transfers Overall transfer level: Needs assistance   Transfers: Sit to/from Stand Sit to Stand: +2 physical assistance;Mod assist         General transfer comment: Patient required mod a to stand but once stancing he was able to pull up his underpants with 1 hand at a time. He stood for about 30 seconds before walking.   Ambulation/Gait Ambulation/Gait assistance: Mod assist Ambulation Distance (Feet): 6 Feet Assistive device: Rolling walker (2 wheeled) Gait Pattern/deviations: Step-to pattern;Decreased stride length;Decreased stance time - right   Gait velocity interpretation: Below normal speed for age/gender General Gait Details:  reuqired mod VC's to take steps. When going to sit he required mod a to control himself to the chair. Therapy had to move the chair to him because he had to sit.   Stairs            Wheelchair Mobility    Modified Rankin (Stroke Patients Only)       Balance Overall balance assessment: Needs assistance Sitting-balance support: Single extremity supported Sitting balance-Leahy Scale: Fair     Standing balance support: Bilateral upper extremity supported Standing balance-Leahy Scale: Poor Standing balance comment: reuqire CGA to min a to remain balanced                              Pertinent Vitals/Pain Pain Assessment: Faces Faces Pain Scale: Hurts little more Pain Descriptors / Indicators: Aching Pain Intervention(s): Limited activity within patient's tolerance    Home Living Family/patient expects to be discharged to:: Skilled nursing facility                 Additional Comments: Patient lives with grandson but will likely need rehab at a SNF prior to D/C     Prior Function Level of Independence: Independent with assistive device(s)         Comments: used a cane and a walker. Also has a scooter to go distances.      Hand Dominance        Extremity/Trunk Assessment   Upper  Extremity Assessment Upper Extremity Assessment: Overall WFL for tasks assessed            Communication      Cognition Arousal/Alertness: Awake/alert Behavior During Therapy: WFL for tasks assessed/performed Overall Cognitive Status: Within Functional Limits for tasks assessed                                 General Comments: in good spirits and motivated to participate       General Comments      Exercises     Assessment/Plan    PT Assessment Patient needs continued PT services  PT Problem List Decreased strength;Decreased activity tolerance;Decreased range of motion;Decreased mobility;Decreased balance;Decreased knowledge of use of  DME;Pain       PT Treatment Interventions Gait training;Stair training;Functional mobility training;Therapeutic activities;Therapeutic exercise;Balance training;Patient/family education    PT Goals (Current goals can be found in the Care Plan section)  Acute Rehab PT Goals Patient Stated Goal: to get stronger  PT Goal Formulation: With patient Time For Goal Achievement: 12/16/16 Potential to Achieve Goals: Good    Frequency Min 5X/week   Barriers to discharge        Co-evaluation               AM-PAC PT "6 Clicks" Daily Activity  Outcome Measure Difficulty turning over in bed (including adjusting bedclothes, sheets and blankets)?: A Lot Difficulty moving from lying on back to sitting on the side of the bed? : A Lot Difficulty sitting down on and standing up from a chair with arms (e.g., wheelchair, bedside commode, etc,.)?: A Lot Help needed moving to and from a bed to chair (including a wheelchair)?: A Lot Help needed walking in hospital room?: A Lot Help needed climbing 3-5 steps with a railing? : Total 6 Click Score: 11    End of Session Equipment Utilized During Treatment: Gait belt Activity Tolerance: Patient limited by pain;Patient tolerated treatment well Patient left: in bed Nurse Communication: Mobility status PT Visit Diagnosis: Unsteadiness on feet (R26.81);Muscle weakness (generalized) (M62.81);Difficulty in walking, not elsewhere classified (R26.2)    Time: 9758-8325 PT Time Calculation (min) (ACUTE ONLY): 25 min   Charges:   PT Evaluation $PT Eval Moderate Complexity: 1 Mod     PT G Codes:        Carney Living PT DPT  12/02/2016, 1:11 PM

## 2016-12-02 NOTE — Evaluation (Signed)
Occupational Therapy Evaluation Patient Details Name: Anthony Galvan MRN: [id number] DOB: [birth year] Today's Date: 12/02/2016    History of Present Illness Patient was lifting a chair at home whn he tanlged his feet, fell, and had suffered a right femur fracture. He had a right IM rod placed on 12/01/2016. PMH:  OA CAD, Past MI;    Clinical Impression   PTA, pt reports independence with cane or RW for ADL and functional mobility. Pt currently requires mod assist for sit<>stand transfers in preparation for toileting tasks, max assist for LB ADL, and supervision for UB ADL. He presents with R hip pain and decreased activity tolerance for ADL limiting his ability to participate at [facility]. Feel pt would best benefit from short-term SNF placement to maximize safety and independence with ADL and functional mobility prior to return home with family. All further OT needs may be met at next venue of care and acute OT will sign off.     Follow Up Recommendations  SNF    Equipment Recommendations  Other (comment) (TBD at next venue of care)    Recommendations for Other Services       Precautions / Restrictions Precautions Precautions: Fall Restrictions Weight Bearing Restrictions: Yes RLE Weight Bearing: Weight bearing as tolerated      Mobility Bed Mobility Overal bed mobility: Needs Assistance Bed Mobility: Supine to Sit;Sit to Supine     Supine to sit: Max assist Sit to supine: Max assist   General bed mobility comments: Assist to scoot hips and manage R LE.  Transfers Overall transfer level: Needs assistance Equipment used: Rolling walker (2 wheeled) Transfers: Sit to/from Stand Sit to Stand: Mod assist         General transfer comment: Mod assist to power up into standing position.     Balance Overall balance assessment: Needs assistance Sitting-balance support: Single extremity supported Sitting balance-Leahy Scale: Fair     Standing balance support: Bilateral upper  extremity supported Standing balance-Leahy Scale: Poor Standing balance comment: Reliant on UE support.                            ADL either performed or assessed with clinical judgement   ADL Overall ADL's : Needs assistance/impaired Eating/Feeding: Set up;Sitting   Grooming: Set up;Sitting   Upper Body Bathing: Supervision/ safety;Sitting   Lower Body Bathing: Maximal assistance;Sit to/from stand   Upper Body Dressing : Supervision/safety;Sitting   Lower Body Dressing: Maximal assistance;Sit to/from stand   Toilet Transfer: Moderate assistance;RW;Stand-pivot Toilet Transfer Details (indicate cue type and reason): Sit<>stand only this session.  Toileting- Clothing Manipulation and Hygiene: Maximal assistance;Sit to/from stand         General ADL Comments: Pt able to complete sit<>stand for repositioning in preparation for toileting tasks with overall mod assist. Pt shaking while standing.      Vision Baseline Vision/History: No visual deficits Patient Visual Report: No change from baseline Vision Assessment?: No apparent visual deficits     Perception     Praxis      Pertinent Vitals/Pain Pain Assessment: Faces Faces Pain Scale: Hurts little more Pain Location: R hip Pain Descriptors / Indicators: Aching Pain Intervention(s): Limited activity within patient's tolerance;Repositioned     Hand Dominance Right   Extremity/Trunk Assessment Upper Extremity Assessment Upper Extremity Assessment: Overall WFL for tasks assessed   Lower Extremity Assessment Lower Extremity Assessment: RLE deficits/detail RLE Deficits / Details: Decreased strength and ROM as expected post-operatively.  Communication Communication Communication: No difficulties   Cognition Arousal/Alertness: Awake/alert Behavior During Therapy: WFL for tasks assessed/performed Overall Cognitive Status: Within Functional Limits for tasks assessed                                  General Comments: in good spirits and motivated to participate    General Comments       Exercises     Shoulder Instructions      Home Living Family/patient expects to be discharged to:: Skilled nursing facility                                 Additional Comments: Patient lives with grandson but will likely need rehab at a SNF prior to D/C       Prior Functioning/Environment Level of Independence: Independent with assistive device(s)        Comments: Used a cane and a walker. Also has a scooter to go distances.         OT Problem List: Decreased strength;Decreased range of motion;Decreased activity tolerance;Impaired balance (sitting and/or standing);Decreased safety awareness;Decreased knowledge of use of DME or AE;Decreased knowledge of precautions;Pain      OT Treatment/Interventions:      OT Goals(Current goals can be found in the care plan section) Acute Rehab OT Goals Patient Stated Goal: to get stronger   OT Frequency:     Barriers to D/C:            Co-evaluation              AM-PAC PT "6 Clicks" Daily Activity     Outcome Measure Help from another person eating meals?: None Help from another person taking care of personal grooming?: None Help from another person toileting, which includes using toliet, bedpan, or urinal?: A Lot Help from another person bathing (including washing, rinsing, drying)?: A Lot Help from another person to put on and taking off regular upper body clothing?: A Little Help from another person to put on and taking off regular lower body clothing?: A Lot 6 Click Score: 17   End of Session Equipment Utilized During Treatment: Gait belt;Rolling walker Nurse Communication: Mobility status  Activity Tolerance: Patient tolerated treatment well Patient left: in bed;with call bell/phone within reach (bed alarm malfunctioning)  OT Visit Diagnosis: Other abnormalities of gait and mobility  (R26.89);Pain Pain - Right/Left: Right Pain - part of body: Hip                Time: 1510-1534 OT Time Calculation (min): 24 min Charges:  OT General Charges $OT Visit: 1 Procedure OT Evaluation $OT Eval Moderate Complexity: 1 Procedure OT Treatments $Self Care/Home Management : 8-22 mins G-Codes:     Norman Herrlich, MS OTR/L  Pager: [place] A Lucia Harm 12/02/2016, 4:05 PM

## 2016-12-02 NOTE — Progress Notes (Signed)
Superior and inferior dressings were saturated with dark blood. Skin cleaned and new adhesive bandages applied. Pt continues to have minimal pain.

## 2016-12-02 NOTE — Progress Notes (Signed)
Subjective: 1 Day Post-Op Procedure(s) (LRB): INTRAMEDULLARY (IM) NAIL RIGHT HIP (Right) Patient reports pain as 5 on 0-10 scale.    Objective: Vital signs in last 24 hours: Temp:  [97.6 F (36.4 C)-98.5 F (36.9 C)] 98.2 F (36.8 C) (08/18 0300) Pulse Rate:  [56-84] 73 (08/18 0300) Resp:  [13-19] 18 (08/18 0300) BP: (120-165)/(50-80) 127/50 (08/18 0300) SpO2:  [79 %-100 %] 98 % (08/18 0300)  Intake/Output from previous day: 08/17 0701 - 08/18 0700 In: 1500 [I.V.:1500] Out: 1300 [Urine:1150; Blood:150] Intake/Output this shift: No intake/output data recorded.   Recent Labs  11/30/16 1230 12/01/16 0935  HGB 12.9* 12.7*    Recent Labs  11/30/16 1230 12/01/16 0935  WBC 14.7* 11.7*  RBC 4.47 4.44  HCT 40.9 39.5  PLT 151 128*    Recent Labs  11/30/16 1230  NA 137  K 4.8  CL 101  CO2 27  BUN 15  CREATININE 1.21  GLUCOSE 95  CALCIUM 8.8*    Recent Labs  11/30/16 1526  INR 0.96    Neurologically intact ABD soft Neurovascular intact Sensation intact distally Intact pulses distally Dorsiflexion/Plantar flexion intact Incision: moderate drainage  Assessment/Plan: 1 Day Post-Op Procedure(s) (LRB): INTRAMEDULLARY (IM) NAIL RIGHT HIP (Right)  Active Problems:   Hyperlipidemia with target low density lipoprotein (LDL) cholesterol less than 70 mg/dL   Essential hypertension   CKD (chronic kidney disease) stage 3, GFR 30-59 ml/min   Closed disp intertrochanteric fracture of right femur with nonunion   CAD in native artery looks good today eating breakfast    No nausea Advance diet Up with therapy  Tajuan Dufault J 12/02/2016, 7:30 AM

## 2016-12-03 NOTE — Progress Notes (Signed)
Subjective: 2 Days Post-Op Procedure(s) (LRB): INTRAMEDULLARY (IM) NAIL RIGHT HIP (Right) Patient reports pain as 3 on 0-10 scale.    Objective: Vital signs in last 24 hours: Temp:  [99 F (37.2 C)] 99 F (37.2 C) (08/18 2100) Pulse Rate:  [78-87] 87 (08/19 0802) Resp:  [18] 18 (08/18 2100) BP: (119)/(59) 119/59 (08/18 2100) SpO2:  [89 %-100 %] 89 % (08/19 0802)  Intake/Output from previous day: 08/18 0701 - 08/19 0700 In: 1100 [P.O.:1000; IV Piggyback:100] Out: 600 [Urine:600] Intake/Output this shift: No intake/output data recorded.   Recent Labs  11/30/16 1230 12/01/16 0935  HGB 12.9* 12.7*    Recent Labs  11/30/16 1230 12/01/16 0935  WBC 14.7* 11.7*  RBC 4.47 4.44  HCT 40.9 39.5  PLT 151 128*    Recent Labs  11/30/16 1230  NA 137  K 4.8  CL 101  CO2 27  BUN 15  CREATININE 1.21  GLUCOSE 95  CALCIUM 8.8*    Recent Labs  11/30/16 1526  INR 0.96    ABD soft Neurovascular intact Sensation intact distally Intact pulses distally Dorsiflexion/Plantar flexion intact Incision: moderate drainage  Assessment/Plan: 2 Days Post-Op Procedure(s) (LRB): INTRAMEDULLARY (IM) NAIL RIGHT HIP (Right)  Active Problems:   Hyperlipidemia with target low density lipoprotein (LDL) cholesterol less than 70 mg/dL   Essential hypertension   CKD (chronic kidney disease) stage 3, GFR 30-59 ml/min   Closed disp intertrochanteric fracture of right femur with nonunion   CAD in native artery This patient is doing well today.  Would like to go to the [facility] center tomorrow.  Will check with social work. Advance diet Up with therapy  Linda Hedges 12/03/2016, 8:26 AM

## 2016-12-03 NOTE — Progress Notes (Signed)
PROGRESS NOTE    Anthony Galvan  LTJ:[id number] DOB: 1921/12/17 DOA: 11/30/2016 PCP: [name], MD    Brief Narrative:  83 y.o. male with medical history of CAD status post BMS to LAD 2014, hypertension, hyperlipidemia and GI bleed in June 2018  presented after a mechanical fall resulting in right hip pain. Subsequently developing a right femoral intertrochanteric fracture.   Assessment & Plan:    Closed disp intertrochanteric fracture of right femur with nonunion - Patient is status post day 1 for intramedullary nailing of right hip. - Continue supportive therapy - DVT recommendations per orthopedic surgeon - Physical therapy consulted.  Active Problems:   Hyperlipidemia with target low density lipoprotein (LDL) cholesterol less than 70 mg/dL - Continue patient on statin.    Essential hypertension - Continue amlodipine, Imdur    CKD (chronic kidney disease) stage 3, GFR 30-59 ml/min - Stable currently.    CAD in native artery - Stable patient on statin. No chest pain reported.  DVT prophylaxis: Per orthopedic surgeon Code Status: Full Family Communication: Discussed with patient and family at bedside. Disposition Plan: Once cleared for discharge by physical therapy or orthopedic surgery   Consultants:   Orthopedic surgery    Procedures: none   Antimicrobials: Cefazolin   Subjective: Pt has no new complaints.  Objective: Vitals:   12/02/16 0823 12/02/16 2100 12/03/16 0802 12/03/16 0852  BP: (!) 121/52 (!) 119/59  (!) 120/52  Pulse: 81 78 87 82  Resp:  18    Temp:  99 F (37.2 C)    TempSrc:  Oral    SpO2: 100% 100% (!) 89% 97%  Weight:      Height:        Intake/Output Summary (Last 24 hours) at 12/03/16 1550 Last data filed at 12/03/16 0300  Gross per 24 hour  Intake              500 ml  Output              600 ml  Net             -100 ml   Filed Weights   11/30/16 1116 11/30/16 2100 12/01/16 0356  Weight: 77.1 kg (170 lb) 77.1 kg (170  lb) 74.4 kg (164 lb)    Examination:Exam unchanged when compared to 12/02/2016  General exam: Appears calm and comfortable, in nad. Respiratory system: Clear to auscultation. Respiratory effort normal. No wheezes Cardiovascular system: S1 & S2 heard, RRR. No JVD, murmurs, rubs, gallops or clicks. No pedal edema. Gastrointestinal system: Abdomen is nondistended, soft and nontender. No organomegaly or masses felt. Normal bowel sounds heard. Central nervous system: Alert and oriented. No focal neurological deficits. Extremities: Symmetric 5 x 5 power. Skin: No rashes, lesions or ulcers, on limited exam. Psychiatry: Mood & affect appropriate.   Data Reviewed: I have personally reviewed following labs and imaging studies  CBC:  Recent Labs Lab 11/30/16 1230 12/01/16 0935  WBC 14.7* 11.7*  NEUTROABS 11.3*  --   HGB 12.9* 12.7*  HCT 40.9 39.5  MCV 91.5 89.0  PLT 151 076*   Basic Metabolic Panel:  Recent Labs Lab 11/30/16 1230  NA 137  K 4.8  CL 101  CO2 27  GLUCOSE 95  BUN 15  CREATININE 1.21  CALCIUM 8.8*   GFR: Estimated Creatinine Clearance: 36 mL/min (by C-G formula based on SCr of 1.21 mg/dL). Liver Function Tests: No results for input(s): AST, ALT, ALKPHOS, BILITOT, PROT, ALBUMIN in the last  168 hours. No results for input(s): LIPASE, AMYLASE in the last 168 hours. No results for input(s): AMMONIA in the last 168 hours. Coagulation Profile:  Recent Labs Lab 11/30/16 1526  INR 0.96   Cardiac Enzymes: No results for input(s): CKTOTAL, CKMB, CKMBINDEX, TROPONINI in the last 168 hours. BNP (last 3 results) No results for input(s): PROBNP in the last 8760 hours. HbA1C: No results for input(s): HGBA1C in the last 72 hours. CBG: No results for input(s): GLUCAP in the last 168 hours. Lipid Profile: No results for input(s): CHOL, HDL, LDLCALC, TRIG, CHOLHDL, LDLDIRECT in the last 72 hours. Thyroid Function Tests: No results for input(s): TSH, T4TOTAL, FREET4,  T3FREE, THYROIDAB in the last 72 hours. Anemia Panel: No results for input(s): VITAMINB12, FOLATE, FERRITIN, TIBC, IRON, RETICCTPCT in the last 72 hours. Sepsis Labs: No results for input(s): PROCALCITON, LATICACIDVEN in the last 168 hours.  Recent Results (from the past 240 hour(s))  Surgical pcr screen     Status: None   Collection Time: 12/01/16  8:55 AM  Result Value Ref Range Status   MRSA, PCR NEGATIVE NEGATIVE Final   Staphylococcus aureus NEGATIVE NEGATIVE Final    Comment:        The Xpert SA Assay (FDA approved for NASAL specimens in patients over 48 years of age), is one component of a comprehensive surveillance program.  Test performance has been validated by [facility] for patients greater than or equal to 66 year old. It is not intended to diagnose infection nor to guide or monitor treatment.          Radiology Studies: No results found.      Scheduled Meds: . amLODipine  5 mg Oral Daily  . atorvastatin  80 mg Oral q1800  . donepezil  5 mg Oral Daily  . enoxaparin (LOVENOX) injection  40 mg Subcutaneous Q24H  . feeding supplement (ENSURE ENLIVE)  237 mL Oral BID BM  . isosorbide mononitrate  60 mg Oral Daily  . loratadine  10 mg Oral Daily  . metoprolol tartrate  50 mg Oral BID  . multivitamin with minerals  1 tablet Oral Daily  . pantoprazole  40 mg Oral BID AC   Continuous Infusions: . lactated ringers 10 mL/hr at 12/01/16 0925     LOS: 3 days    Time spent: > 25 min  Velvet Bathe, MD [facility] Pager [phone]  If 7PM-7AM, please contact night-coverage [email or web address] Password [email or web address] 12/03/2016, 3:50 PM

## 2016-12-04 ENCOUNTER — Inpatient Hospital Stay
Admission: RE | Admit: 2016-12-04 | Discharge: 2017-01-06 | Disposition: A | Discharge status: Home / Self Care | Payer: Medicare Other | Source: Ambulatory Visit | Attending: Internal Medicine | Admitting: Internal Medicine

## 2016-12-04 ENCOUNTER — Encounter (HOSPITAL_COMMUNITY): Payer: Self-pay | Admitting: Orthopedic Surgery

## 2016-12-04 DIAGNOSIS — I252 Old myocardial infarction: Secondary | ICD-10-CM | POA: Diagnosis not present

## 2016-12-04 DIAGNOSIS — M1990 Unspecified osteoarthritis, unspecified site: Secondary | ICD-10-CM | POA: Diagnosis not present

## 2016-12-04 DIAGNOSIS — G8911 Acute pain due to trauma: Secondary | ICD-10-CM | POA: Diagnosis not present

## 2016-12-04 DIAGNOSIS — M6281 Muscle weakness (generalized): Secondary | ICD-10-CM | POA: Diagnosis not present

## 2016-12-04 DIAGNOSIS — R0602 Shortness of breath: Secondary | ICD-10-CM | POA: Diagnosis not present

## 2016-12-04 DIAGNOSIS — Z955 Presence of coronary angioplasty implant and graft: Secondary | ICD-10-CM | POA: Diagnosis not present

## 2016-12-04 DIAGNOSIS — S72141A Displaced intertrochanteric fracture of right femur, initial encounter for closed fracture: Secondary | ICD-10-CM | POA: Diagnosis not present

## 2016-12-04 DIAGNOSIS — K219 Gastro-esophageal reflux disease without esophagitis: Secondary | ICD-10-CM | POA: Diagnosis not present

## 2016-12-04 DIAGNOSIS — S72141K Displaced intertrochanteric fracture of right femur, subsequent encounter for closed fracture with nonunion: Secondary | ICD-10-CM | POA: Diagnosis not present

## 2016-12-04 DIAGNOSIS — N183 Chronic kidney disease, stage 3 (moderate): Secondary | ICD-10-CM | POA: Diagnosis not present

## 2016-12-04 DIAGNOSIS — I2510 Atherosclerotic heart disease of native coronary artery without angina pectoris: Secondary | ICD-10-CM | POA: Diagnosis not present

## 2016-12-04 DIAGNOSIS — K264 Chronic or unspecified duodenal ulcer with hemorrhage: Secondary | ICD-10-CM | POA: Diagnosis not present

## 2016-12-04 DIAGNOSIS — E785 Hyperlipidemia, unspecified: Secondary | ICD-10-CM | POA: Diagnosis not present

## 2016-12-04 DIAGNOSIS — S72141D Displaced intertrochanteric fracture of right femur, subsequent encounter for closed fracture with routine healing: Secondary | ICD-10-CM | POA: Diagnosis not present

## 2016-12-04 DIAGNOSIS — I200 Unstable angina: Secondary | ICD-10-CM | POA: Diagnosis not present

## 2016-12-04 DIAGNOSIS — K279 Peptic ulcer, site unspecified, unspecified as acute or chronic, without hemorrhage or perforation: Secondary | ICD-10-CM | POA: Diagnosis not present

## 2016-12-04 DIAGNOSIS — R279 Unspecified lack of coordination: Secondary | ICD-10-CM | POA: Diagnosis not present

## 2016-12-04 DIAGNOSIS — I10 Essential (primary) hypertension: Secondary | ICD-10-CM | POA: Diagnosis not present

## 2016-12-04 DIAGNOSIS — S92153A Displaced avulsion fracture (chip fracture) of unspecified talus, initial encounter for closed fracture: Secondary | ICD-10-CM | POA: Diagnosis not present

## 2016-12-04 LAB — VITAMIN D 25 HYDROXY (VIT D DEFICIENCY, FRACTURES): Vit D, 25-Hydroxy: 15.4 ng/mL — ABNORMAL LOW (ref 30.0–100.0)

## 2016-12-04 MED ORDER — ENOXAPARIN SODIUM 40 MG/0.4ML ~~LOC~~ SOLN: 40.0000 mg | SUBCUTANEOUS | 0 refills | Status: DC

## 2016-12-04 MED ORDER — ACETAMINOPHEN 325 MG PO TABS: 650.0000 mg | ORAL_TABLET | Freq: Three times a day (TID) | ORAL | Status: AC | PRN

## 2016-12-04 MED ORDER — HYDROCODONE-ACETAMINOPHEN 5-325 MG PO TABS: 1.0000 | ORAL_TABLET | Freq: Four times a day (QID) | ORAL | 0 refills | Status: DC | PRN

## 2016-12-04 MED ORDER — VITAMIN D 1000 UNITS PO TABS
2000.0000 [IU] | ORAL_TABLET | Freq: Two times a day (BID) | ORAL | Status: DC
  Administered 2016-12-04: 2000 [IU] via ORAL
  Filled 2016-12-04 (×2): qty 2

## 2016-12-04 MED ORDER — CHOLECALCIFEROL 125 MCG (5000 UT) PO TABS: 5000.0000 [IU] | ORAL_TABLET | Freq: Every day | ORAL | 2 refills | Status: DC

## 2016-12-04 NOTE — Progress Notes (Signed)
PROGRESS NOTE    Anthony Galvan  IZT:[id number] DOB: 28-Jan-1922 DOA: 11/30/2016 PCP: [name], MD    Brief Narrative:  83 y.o. male with medical history of CAD status post BMS to LAD 2014, hypertension, hyperlipidemia and GI bleed in June 2018  presented after a mechanical fall resulting in right hip pain. Subsequently developing a right femoral intertrochanteric fracture.   Assessment & Plan:    Closed disp intertrochanteric fracture of right femur with nonunion - Patient is status post day 2 for intramedullary nailing of right hip. - Continue supportive therapy - Ortho would like to discharge tomorrow if condition remains stable or improved.  Active Problems:   Hyperlipidemia with target low density lipoprotein (LDL) cholesterol less than 70 mg/dL - Continue patient on statin.    Essential hypertension - Continue amlodipine, Imdur    CKD (chronic kidney disease) stage 3, GFR 30-59 ml/min - Stable currently.    CAD in native artery - Stable patient on statin. No chest pain reported.  DVT prophylaxis: Per orthopedic surgeon Code Status: Full Family Communication: Discussed with patient and family at bedside. Disposition Plan: Once cleared for discharge by physical therapy or orthopedic surgery, looking like next am to SNF   Consultants:   Orthopedic surgery    Procedures: none   Antimicrobials: Cefazolin   Subjective: No acute problems reported to me today.  Objective: Vitals:   12/03/16 0852 12/03/16 1853 12/03/16 1900 12/04/16 0300  BP: (!) 120/52 121/66 (!) 121/47 (!) 126/56  Pulse: 82 75 83 69  Resp:  16 16 18   Temp:  98 F (36.7 C) 99.2 F (37.3 C) 98.5 F (36.9 C)  TempSrc:  Oral Oral Oral  SpO2: 97% 99% 99% 100%  Weight:      Height:        Intake/Output Summary (Last 24 hours) at 12/04/16 1149 Last data filed at 12/04/16 0800  Gross per 24 hour  Intake              240 ml  Output             1100 ml  Net             -860 ml    Filed Weights   11/30/16 1116 11/30/16 2100 12/01/16 0356  Weight: 77.1 kg (170 lb) 77.1 kg (170 lb) 74.4 kg (164 lb)    Examination:Exam unchanged when compared to 12/03/2016  General exam: Appears calm and comfortable, in nad. Respiratory system: Clear to auscultation. Respiratory effort normal. No wheezes Cardiovascular system: S1 & S2 heard, RRR. No JVD, murmurs, rubs, gallops or clicks. No pedal edema. Gastrointestinal system: Abdomen is nondistended, soft and nontender. No organomegaly or masses felt. Normal bowel sounds heard. Central nervous system: Alert and oriented. No focal neurological deficits. Extremities: Symmetric 5 x 5 power. Skin: No rashes, lesions or ulcers, on limited exam. Psychiatry: Mood & affect appropriate.   Data Reviewed: I have personally reviewed following labs and imaging studies  CBC:  Recent Labs Lab 11/30/16 1230 12/01/16 0935  WBC 14.7* 11.7*  NEUTROABS 11.3*  --   HGB 12.9* 12.7*  HCT 40.9 39.5  MCV 91.5 89.0  PLT 151 382*   Basic Metabolic Panel:  Recent Labs Lab 11/30/16 1230  NA 137  K 4.8  CL 101  CO2 27  GLUCOSE 95  BUN 15  CREATININE 1.21  CALCIUM 8.8*   GFR: Estimated Creatinine Clearance: 36 mL/min (by C-G formula based on SCr of 1.21 mg/dL). Liver  Function Tests: No results for input(s): AST, ALT, ALKPHOS, BILITOT, PROT, ALBUMIN in the last 168 hours. No results for input(s): LIPASE, AMYLASE in the last 168 hours. No results for input(s): AMMONIA in the last 168 hours. Coagulation Profile:  Recent Labs Lab 11/30/16 1526  INR 0.96   Cardiac Enzymes: No results for input(s): CKTOTAL, CKMB, CKMBINDEX, TROPONINI in the last 168 hours. BNP (last 3 results) No results for input(s): PROBNP in the last 8760 hours. HbA1C: No results for input(s): HGBA1C in the last 72 hours. CBG: No results for input(s): GLUCAP in the last 168 hours. Lipid Profile: No results for input(s): CHOL, HDL, LDLCALC, TRIG, CHOLHDL,  LDLDIRECT in the last 72 hours. Thyroid Function Tests: No results for input(s): TSH, T4TOTAL, FREET4, T3FREE, THYROIDAB in the last 72 hours. Anemia Panel: No results for input(s): VITAMINB12, FOLATE, FERRITIN, TIBC, IRON, RETICCTPCT in the last 72 hours. Sepsis Labs: No results for input(s): PROCALCITON, LATICACIDVEN in the last 168 hours.  Recent Results (from the past 240 hour(s))  Surgical pcr screen     Status: None   Collection Time: 12/01/16  8:55 AM  Result Value Ref Range Status   MRSA, PCR NEGATIVE NEGATIVE Final   Staphylococcus aureus NEGATIVE NEGATIVE Final    Comment:        The Xpert SA Assay (FDA approved for NASAL specimens in patients over 49 years of age), is one component of a comprehensive surveillance program.  Test performance has been validated by [facility] for patients greater than or equal to 68 year old. It is not intended to diagnose infection nor to guide or monitor treatment.          Radiology Studies: No results found.      Scheduled Meds: . amLODipine  5 mg Oral Daily  . atorvastatin  80 mg Oral q1800  . donepezil  5 mg Oral Daily  . enoxaparin (LOVENOX) injection  40 mg Subcutaneous Q24H  . feeding supplement (ENSURE ENLIVE)  237 mL Oral BID BM  . isosorbide mononitrate  60 mg Oral Daily  . loratadine  10 mg Oral Daily  . metoprolol tartrate  50 mg Oral BID  . multivitamin with minerals  1 tablet Oral Daily  . pantoprazole  40 mg Oral BID AC   Continuous Infusions: . lactated ringers 10 mL/hr at 12/01/16 0925     LOS: 4 days    Time spent: > 15 min  Velvet Bathe, MD [facility] Pager [phone]  If 7PM-7AM, please contact night-coverage [email or web address] Password [email or web address] 12/04/2016, 11:49 AM

## 2016-12-04 NOTE — Progress Notes (Signed)
Gave report to [place] at [facility]. IV removed. Reviewed D/C paperwork with patient. Patient will be delivered to [facility] via [facility]. Patient's belonging sent with patient including cell phone and charger.

## 2016-12-04 NOTE — Clinical Social Work Placement (Signed)
   CLINICAL SOCIAL WORK PLACEMENT  NOTE  Date:  12/04/2016  Patient Details  Name: Anthony Galvan MRN: [id number] Date of Birth: 12-Jun-1921  Clinical Social Work is seeking post-discharge placement for this patient at the [place] level of care (*CSW will initial, date and re-position this form in  chart as items are completed):  Yes   Patient/family provided with [place] Work Department's list of facilities offering this level of care within the geographic area requested by the patient (or if unable, by the patient's family).  Yes   Patient/family informed of their freedom to choose among providers that offer the needed level of care, that participate in Medicare, Medicaid or managed care program needed by the patient, have an available bed and are willing to accept the patient.  Yes   Patient/family informed of [place]'s ownership interest in [facility] and [facility], as well as of the fact that they are under no obligation to receive care at these facilities.  PASRR submitted to EDS on       PASRR number received on 12/04/16     Existing PASRR number confirmed on       FL2 transmitted to all facilities in geographic area requested by pt/family on 12/04/16     FL2 transmitted to all facilities within larger geographic area on       Patient informed that his/her managed care company has contracts with or will negotiate with certain facilities, including the following:        Yes   Patient/family informed of bed offers received.  Patient chooses bed at [facility]     Physician recommends and patient chooses bed at      Patient to be transferred to [facility] on 12/04/16.  Patient to be transferred to facility by [facility]     Patient family notified on 12/04/16 of transfer.  Name of family member notified:  patient responsible for self     PHYSICIAN Please prepare priority discharge summary, including  medications, Please prepare prescriptions, Please sign FL2     Additional Comment:    _______________________________________________ Normajean Baxter, LCSW 12/04/2016, 3:09 PM

## 2016-12-04 NOTE — Social Work (Signed)
Patient has accepted bed offer at [facility].  CSW will f/u for DC to SNF.  Elissa Hefty, LCSW Clinical Social Worker [phone]

## 2016-12-04 NOTE — Progress Notes (Signed)
Physical Therapy Treatment Patient Details Name: Anthony Galvan MRN: [id number] DOB: 04/08/1922 Today's Date: 12/04/2016    History of Present Illness Patient was lifting a chair at home whn he tanlged his feet, fell, and had suffered a right femur fracture. He had a right IM rod placed on 12/01/2016. PMH:  OA CAD, Past MI;     PT Comments    Pt performed increased acitvity and gait distance during session today.  Pt required use of a youth RW during session to improve gt quality.  Pt requesting to have BM post gait training and produced a rather large BM.  Nursing is aware.  Pt remains appropriate for SNF placement to improve strength and functional mobility.  Plan next session to increase qait quality and tolerate therapeutic exercise.     Follow Up Recommendations  SNF     Equipment Recommendations  Other (comment) (DME to be arranged at next level of care.  )    Recommendations for Other Services Rehab consult     Precautions / Restrictions Precautions Precautions: Fall Restrictions Weight Bearing Restrictions: Yes RLE Weight Bearing: Weight bearing as tolerated    Mobility  Bed Mobility Overal bed mobility: Needs Assistance Bed Mobility: Supine to Sit     Supine to sit: Min assist     General bed mobility comments: Cues for hand placement and assist to elevate trunk into sitting.  pt with better ability to advance hips to edge of bed.    Transfers Overall transfer level: Needs assistance Equipment used: Rolling walker (2 wheeled) Transfers: Sit to/from Stand Sit to Stand: Mod assist         General transfer comment: Cues for hand placement to and from seated surface.  Pt required cues for forward weight shifting and mod assistance to boost into standing.    Ambulation/Gait Ambulation/Gait assistance: Mod assist Ambulation Distance (Feet): 20 Feet (+ 10 ft after seated break.  ) Assistive device: Rolling walker (2 wheeled) (during 1st trial patient with  standard height RW and increased flexion observed in B elbows.  After seated breaj patient switched to RW and improved posture observed.  ) Gait Pattern/deviations: Step-to pattern;Decreased stride length;Decreased stance time - right;Antalgic;Trunk flexed   Gait velocity interpretation: Below normal speed for age/gender General Gait Details: Cues for sequencing, assistance with turning and assistance with backing to seated surface.  Cues for posture and upper trunk control as well as RW safety.     Stairs            Wheelchair Mobility    Modified Rankin (Stroke Patients Only)       Balance Overall balance assessment: Needs assistance Sitting-balance support: Single extremity supported Sitting balance-Leahy Scale: Fair       Standing balance-Leahy Scale: Poor Standing balance comment: Reliant on UE support.                             Cognition Arousal/Alertness: Awake/alert Behavior During Therapy: WFL for tasks assessed/performed Overall Cognitive Status: Within Functional Limits for tasks assessed                                 General Comments: in good spirits and motivated to participate       Exercises      General Comments        Pertinent Vitals/Pain Pain Assessment: Faces Faces Pain Scale: Hurts  little more Pain Location: R hip Pain Descriptors / Indicators: Aching Pain Intervention(s): Monitored during session;Repositioned    Home Living                      Prior Function            PT Goals (current goals can now be found in the care plan section) Acute Rehab PT Goals Patient Stated Goal: to get stronger  Potential to Achieve Goals: Good Progress towards PT goals: Progressing toward goals    Frequency    Min 5X/week      PT Plan Current plan remains appropriate    Co-evaluation              AM-PAC PT "6 Clicks" Daily Activity  Outcome Measure  Difficulty turning over in bed  (including adjusting bedclothes, sheets and blankets)?: Unable Difficulty moving from lying on back to sitting on the side of the bed? : Unable Difficulty sitting down on and standing up from a chair with arms (e.g., wheelchair, bedside commode, etc,.)?: Unable Help needed moving to and from a bed to chair (including a wheelchair)?: A Lot Help needed walking in hospital room?: A Lot Help needed climbing 3-5 steps with a railing? : Total 6 Click Score: 8    End of Session Equipment Utilized During Treatment: Gait belt Activity Tolerance: Patient limited by pain;Patient tolerated treatment well Patient left: in bed Nurse Communication: Mobility status PT Visit Diagnosis: Unsteadiness on feet (R26.81);Muscle weakness (generalized) (M62.81);Difficulty in walking, not elsewhere classified (R26.2)     Time: 1125-1209 PT Time Calculation (min) (ACUTE ONLY): 44 min  Charges:  $Gait Training: 8-22 mins $Therapeutic Activity: 23-37 mins                    G Codes:       Governor Rooks, PTA pager [place] 12/04/2016, 12:14 PM

## 2016-12-04 NOTE — Discharge Summary (Signed)
Physician Discharge Summary  Anthony Galvan EUM:[id number] DOB: 1921/06/29 DOA: 11/30/2016  PCP: [name], MD  Admit date: 11/30/2016 Discharge date: 12/04/2016  Time spent: > 35 minutes  Recommendations for Outpatient Follow-up:  1. Ensure f/u with orthopaedic surgeon   Discharge Diagnoses:  Active Problems:   Hyperlipidemia with target low density lipoprotein (LDL) cholesterol less than 70 mg/dL   Essential hypertension   CKD (chronic kidney disease) stage 3, GFR 30-59 ml/min   Closed disp intertrochanteric fracture of right femur with nonunion   CAD in native artery   Discharge Condition: stable  Diet recommendation: heart healthy  Filed Weights   11/30/16 1116 11/30/16 2100 12/01/16 0356  Weight: 77.1 kg (170 lb) 77.1 kg (170 lb) 74.4 kg (164 lb)    History of present illness:  83 y.o.malewith medical history of CAD status post BMS to LAD 2014, hypertension, hyperlipidemia and GI bleed in June 2018 presented after a mechanical fall resulting in right hip pain. Subsequently developing a right femoral intertrochanteric fracture.   Hospital Course:  Closed disp intertrochanteric fracture of right femur with nonunion - Patient is status post day 2 for intramedullary nailing of right hip. - Continue PT at SNF  Active Problems:   Hyperlipidemia with target low density lipoprotein (LDL) cholesterol less than 70 mg/dL - Continue patient on statin.    Essential hypertension - Continue amlodipine, Imdur    CKD (chronic kidney disease) stage 3, GFR 30-59 ml/min - Stable currently.    CAD in native artery - Stable patient on statin. No chest pain reported.  Procedures: Patient is status post day 2 for intramedullary nailing of right hip.  Consultations: Ortho. Last seen by Matthew Saras  Discharge Exam: Vitals:   12/04/16 0300 12/04/16 1419  BP: (!) 126/56 136/63  Pulse: 69 83  Resp: 18 18  Temp: 98.5 F (36.9 C) 98.1 F (36.7 C)  SpO2: 100%  95%    General: Pt in nad. Alert and awake Cardiovascular: rrr, no rubs Respiratory: no increased wob, no wheezes  Discharge Instructions   Discharge Instructions    Call MD for:  severe uncontrolled pain    Complete by:  As directed    Call MD for:  temperature >100.4    Complete by:  As directed    Diet - low sodium heart healthy    Complete by:  As directed    Discharge instructions    Complete by:  As directed    Please be sure to follow up with your orthopaedic surgeon as per their recommendations.   Increase activity slowly    Complete by:  As directed    Weight bearing as tolerated    Complete by:  As directed    Laterality:  right   Extremity:  Lower     Current Discharge Medication List    START taking these medications   Details  cholecalciferol 5000 units TABS Take 1 tablet (5,000 Units total) by mouth daily. Qty: 60 tablet, Refills: 2    enoxaparin (LOVENOX) 40 MG/0.4ML injection Inject 0.4 mLs (40 mg total) into the skin daily. Qty: 21 Syringe, Refills: 0    HYDROcodone-acetaminophen (NORCO/VICODIN) 5-325 MG tablet Take 1-2 tablets by mouth every 6 (six) hours as needed for moderate pain. Qty: 30 tablet, Refills: 0      CONTINUE these medications which have CHANGED   Details  acetaminophen (TYLENOL) 325 MG tablet Take 2 tablets (650 mg total) by mouth 3 (three) times daily as needed (pain).  CONTINUE these medications which have NOT CHANGED   Details  amLODipine (NORVASC) 5 MG tablet Take 1 tablet (5 mg total) by mouth daily. Qty: 90 tablet, Refills: 1    atorvastatin (LIPITOR) 80 MG tablet Take 1 tablet (80 mg total) by mouth daily at 6 PM. Qty: 30 tablet, Refills: 5    cetirizine (ZYRTEC) 5 MG tablet Take 5 mg by mouth daily.    donepezil (ARICEPT) 10 MG tablet Take 5 mg by mouth daily.    isosorbide mononitrate (IMDUR) 30 MG 24 hr tablet Take 2 tablets (60 mg total) by mouth daily. Qty: 60 tablet, Refills: 2    metoprolol (LOPRESSOR)  50 MG tablet Take 50 mg by mouth 2 (two) times daily.    Multiple Vitamin (MULTIVITAMIN WITH MINERALS) TABS tablet Take 1 tablet by mouth daily.    NITROSTAT 0.4 MG SL tablet PLACE 1 TABLET UNDER TONGUE EVERY 5 MINUTES FOR 3 DOSES AS NEEDED. Qty: 25 tablet, Refills: 6    pantoprazole (PROTONIX) 40 MG tablet Take 1 tablet (40 mg total) by mouth 2 (two) times daily before a meal. Qty: 60 tablet, Refills: 3       No Known Allergies  Contact information for follow-up providers    Altamese Wade, MD. Schedule an appointment as soon as possible for a visit in 2 week(s).   Specialty:  Orthopedic Surgery Contact information: [address] [place] [address] [phone]            Contact information for after-discharge care    [facility] Follow up.   Specialty:  Skilled Nursing Facility Contact information: [address] [place] [phone]                   The results of significant diagnostics from this hospitalization (including imaging, microbiology, ancillary and laboratory) are listed below for reference.    Significant Diagnostic Studies: Dg Chest 1 View  Result Date: 11/30/2016 CLINICAL DATA:  Right hip pain, fracture EXAM: CHEST 1 VIEW COMPARISON:  10/24/2016 FINDINGS: Heart is borderline in size. Chronic interstitial prominence throughout the lungs, likely chronic interstitial lung disease/ scarring. Patchy opacity in the inferior right upper lobe could reflect scarring or early infiltrate. No effusions. IMPRESSION: Areas of chronic interstitial prominence is scarring in the lungs. Inferior right upper lobe scarring versus early infiltrate. Borderline heart size. Electronically Signed   By: Rolm Baptise M.D.   On: 11/30/2016 12:07   Dg C-arm 1-60 Min  Result Date: 12/01/2016 CLINICAL DATA:  Right femur ORIF EXAM: RIGHT FEMUR 2 VIEWS; DG C-ARM 61-120 MIN COMPARISON:  Right hip  x-rays dated November 30, 2016. FLUOROSCOPY TIME:  58 seconds. C-arm fluoroscopic images were obtained intraoperatively and submitted for post operative interpretation. FINDINGS: Multiple intraoperative x-rays demonstrate interval placement of a right femur cephalomedullary rod. Alignment is near anatomic. No definite hardware complication. IMPRESSION: Intraoperative x-rays demonstrating interval right femur cephalomedullary rod placement. Electronically Signed   By: Titus Dubin M.D.   On: 12/01/2016 11:39   Dg Hip Unilat W Or Wo Pelvis 2-3 Views Right  Result Date: 11/30/2016 CLINICAL DATA:  Fall.  Right hip pain. EXAM: DG HIP (WITH OR WITHOUT PELVIS) 2-3V RIGHT COMPARISON:  None. FINDINGS: There is a right femoral intertrochanteric fracture with varus angulation. No subluxation or dislocation. Mild osteoarthritic changes in the hips bilaterally. SI joints are symmetric and unremarkable. IMPRESSION: Right femoral intertrochanteric fracture with mild varus angulation.  Electronically Signed   By: Rolm Baptise M.D.   On: 11/30/2016 12:07   Dg Femur, Min 2 Views Right  Result Date: 12/01/2016 CLINICAL DATA:  Right femur ORIF EXAM: RIGHT FEMUR 2 VIEWS; DG C-ARM 61-120 MIN COMPARISON:  Right hip x-rays dated November 30, 2016. FLUOROSCOPY TIME:  58 seconds. C-arm fluoroscopic images were obtained intraoperatively and submitted for post operative interpretation. FINDINGS: Multiple intraoperative x-rays demonstrate interval placement of a right femur cephalomedullary rod. Alignment is near anatomic. No definite hardware complication. IMPRESSION: Intraoperative x-rays demonstrating interval right femur cephalomedullary rod placement. Electronically Signed   By: Titus Dubin M.D.   On: 12/01/2016 11:39   Dg Femur Port, Min 2 Views Right  Result Date: 12/01/2016 CLINICAL DATA:  Intramedullary nail. EXAM: RIGHT FEMUR PORTABLE 2 VIEW COMPARISON:  12/01/2016. FINDINGS: Intramedullary rod noted traversing the right  femur with right hip screw. Anatomic alignment. Hardware intact. Peripheral vascular calcification. IMPRESSION: Postsurgical changes right femur. Electronically Signed   By: Marcello Moores  Register   On: 12/01/2016 13:55    Microbiology: Recent Results (from the past 240 hour(s))  Surgical pcr screen     Status: None   Collection Time: 12/01/16  8:55 AM  Result Value Ref Range Status   MRSA, PCR NEGATIVE NEGATIVE Final   Staphylococcus aureus NEGATIVE NEGATIVE Final    Comment:        The Xpert SA Assay (FDA approved for NASAL specimens in patients over 31 years of age), is one component of a comprehensive surveillance program.  Test performance has been validated by [facility] for patients greater than or equal to 29 year old. It is not intended to diagnose infection nor to guide or monitor treatment.      Labs: Basic Metabolic Panel:  Recent Labs Lab 11/30/16 1230  NA 137  K 4.8  CL 101  CO2 27  GLUCOSE 95  BUN 15  CREATININE 1.21  CALCIUM 8.8*   Liver Function Tests: No results for input(s): AST, ALT, ALKPHOS, BILITOT, PROT, ALBUMIN in the last 168 hours. No results for input(s): LIPASE, AMYLASE in the last 168 hours. No results for input(s): AMMONIA in the last 168 hours. CBC:  Recent Labs Lab 11/30/16 1230 12/01/16 0935  WBC 14.7* 11.7*  NEUTROABS 11.3*  --   HGB 12.9* 12.7*  HCT 40.9 39.5  MCV 91.5 89.0  PLT 151 128*   Cardiac Enzymes: No results for input(s): CKTOTAL, CKMB, CKMBINDEX, TROPONINI in the last 168 hours. BNP: BNP (last 3 results) No results for input(s): BNP in the last 8760 hours.  ProBNP (last 3 results) No results for input(s): PROBNP in the last 8760 hours.  CBG: No results for input(s): GLUCAP in the last 168 hours.   Signed:  Velvet Bathe MD.  [facility] 12/04/2016, 2:26 PM

## 2016-12-04 NOTE — NC FL2 (Signed)
[place] LEVEL OF CARE SCREENING TOOL     IDENTIFICATION  Patient Name: Anthony Galvan Birthdate: [birth year] Sex: male Admission Date (Current Location): 11/30/2016  [facility] and [place] Number:  Herbalist and Address:  [facility], [place] [address], [place], [place] [address]      Provider Number: [id number]  Attending Physician Name and Address:  Velvet Bathe, MD  Relative Name and Phone Number:       Current Level of Care: Hospital Recommended Level of Care: [place] Prior Approval Number:    Date Approved/Denied: 12/04/16 PASRR Number: [id number]  Discharge Plan: SNF    Current Diagnoses: Patient Active Problem List   Diagnosis Date Noted  . Closed disp intertrochanteric fracture of right femur with nonunion 11/30/2016  . CAD in native artery 11/30/2016  . Hyperlipidemia 10/20/2016  . PUD (peptic ulcer disease)   . GI bleed 10/08/2016  . History of coronary artery stent placement 10/08/2016  . Acute on chronic renal failure ([place]) 10/08/2016  . CKD (chronic kidney disease) stage 3, GFR 30-59 ml/min 10/08/2016  . Melena 10/08/2016  . Right sided abdominal pain 09/03/2013  . Bloating 09/03/2013  . Nausea with vomiting 09/03/2013  . CAD S/P percutaneous coronary angioplasty   . MI (myocardial infarction) ([place])   . Presence of bare metal stent in LAD coronary artery - VerifFlex BMS 3.0 mm x 12 mm - post-dilated to 3.5 mm 08/01/2012  . Unstable angina ([place]) 07/30/2012  . Gastritis 12/15/2010  . Nausea 08/30/2010  . Duodenal ulcer with hemorrhage 02/10/2010  . Hyperlipidemia with target low density lipoprotein (LDL) cholesterol less than 70 mg/dL 05/21/2006  . CATARACT NOS 05/21/2006  . Essential hypertension 05/21/2006  . MYOCARDIAL INFARCTION, HX OF 05/21/2006  . GERD 05/21/2006  . OVERACTIVE BLADDER 05/21/2006  . OSTEOARTHRITIS 05/21/2006  . URINARY INCONTINENCE 05/21/2006    Orientation  RESPIRATION BLADDER Height & Weight     Self, Time, Situation, Place  O2 (Nasal Cannula 2L) Continent Weight: 164 lb (74.4 kg) Height:  5\' 4"  (162.6 cm)  BEHAVIORAL SYMPTOMS/MOOD NEUROLOGICAL BOWEL NUTRITION STATUS      Continent Diet (See DC Summary)  AMBULATORY STATUS COMMUNICATION OF NEEDS Skin   Extensive Assist Verbally Surgical wounds (Right Hip Closed Incision with Hydrocolloid)                       Personal Care Assistance Level of Assistance  Bathing, Feeding, Dressing Bathing Assistance: Maximum assistance Feeding assistance: Limited assistance Dressing Assistance: Maximum assistance     Functional Limitations Info             SPECIAL CARE FACTORS FREQUENCY  PT (By licensed PT), OT (By licensed OT)     PT Frequency: 5x week OT Frequency: 2x week            Contractures      Additional Factors Info  Code Status, Allergies Code Status Info: Full code Allergies Info: No Known Allergies           Current Medications (12/04/2016):  This is the current hospital active medication list Current Facility-Administered Medications  Medication Dose Route Frequency Provider Last Rate Last Dose  . acetaminophen (TYLENOL) tablet 650 mg  650 mg Oral TID PRN Orson Eva, MD   650 mg at 12/03/16 2210  . amLODipine (NORVASC) tablet 5 mg  5 mg Oral Daily Tat, David, MD   5 mg at 12/04/16 1003  .  atorvastatin (LIPITOR) tablet 80 mg  80 mg Oral q1800 Orson Eva, MD   80 mg at 12/03/16 1807  . donepezil (ARICEPT) tablet 5 mg  5 mg Oral Daily Tat, David, MD   5 mg at 12/04/16 1004  . enoxaparin (LOVENOX) injection 40 mg  40 mg Subcutaneous Q24H Orson Eva, MD   40 mg at 12/03/16 2209  . feeding supplement (ENSURE ENLIVE) (ENSURE ENLIVE) liquid 237 mL  237 mL Oral BID BM Velvet Bathe, MD   237 mL at 12/04/16 1001  . HYDROcodone-acetaminophen (NORCO/VICODIN) 5-325 MG per tablet 1-2 tablet  1-2 tablet Oral Q6H PRN Orson Eva, MD   1 tablet at 12/03/16 1250  . isosorbide  mononitrate (IMDUR) 24 hr tablet 60 mg  60 mg Oral Daily Tat, David, MD   60 mg at 12/04/16 1002  . lactated ringers infusion   Intravenous Continuous Rica Koyanagi, MD 10 mL/hr at 12/01/16 [id number]    . loratadine (CLARITIN) tablet 10 mg  10 mg Oral Daily Tat, David, MD   10 mg at 12/04/16 1004  . metoprolol tartrate (LOPRESSOR) tablet 50 mg  50 mg Oral BID Tat, Shanon Brow, MD   50 mg at 12/04/16 1004  . morphine 4 MG/ML injection 0.52 mg  0.52 mg Intravenous Q2H PRN Orson Eva, MD   0.52 mg at 12/01/16 1521  . multivitamin with minerals tablet 1 tablet  1 tablet Oral Daily Tat, David, MD   1 tablet at 12/04/16 1003  . ondansetron (ZOFRAN) injection 4 mg  4 mg Intravenous Q8H PRN Velvet Bathe, MD   4 mg at 12/01/16 1754  . pantoprazole (PROTONIX) EC tablet 40 mg  40 mg Oral BID Mina Marble, MD   40 mg at 12/04/16 1003     Discharge Medications: Please see discharge summary for a list of discharge medications.  Relevant Imaging Results:  Relevant Lab Results:   Additional Information SS#:[id number] [place], LCSW

## 2016-12-04 NOTE — Clinical Social Work Note (Signed)
Clinical Social Work Assessment  Patient Details  Name: Anthony Galvan MRN: [id number] Date of Birth: 1921/05/04  Date of referral:  12/04/16               Reason for consult:  Facility Placement                Permission sought to share information with:  Facility Art therapist granted to share information::  Yes, Verbal Permission Granted  Name::        Agency::  SNF  Relationship::     Contact Information:     Housing/Transportation Living arrangements for the past 2 months:  Single Family Home Source of Information:  Patient Patient Interpreter Needed:  None Criminal Activity/Legal Involvement Pertinent to Current Situation/Hospitalization:    Significant Relationships:  Other Family Members Lives with:  Self Do you feel safe going back to the place where you live?  No Need for family participation in patient care:  No (Coment)  Care giving concerns:  Pt from home alone and has some visits from grandson in community. Pt does not have support at home and is not safe to return home at this time.  Social Worker assessment / plan:  CSW met with patient at bedside to discuss clinical teams recommendation for SNF. CSW introduced self and role of CSW with DC to SNF.  CSW explained the SNF options/placement. Patient indicated that he wants to go to SNF in [place] and [place] area.  FL2 completed. Passr obtained. SNF offers sent.  Employment status:  Retired Nurse, adult PT Recommendations:  [place] / Referral to community resources:  [place]  Patient/Family's Response to care:  Patient appreciative of CSW assistance with placement. No issues or concerns identified.  Patient/Family's Understanding of and Emotional Response to Diagnosis, Current Treatment, and Prognosis:  Patient has good understanding of diagnosis, current treatment and prognosis. Pt hopeful that he will improve and return  home after rehab. No issues or concerns identified.  Emotional Assessment Appearance:  Appears stated age Attitude/Demeanor/Rapport:   (Cooperative) Affect (typically observed):  Accepting, Appropriate Orientation:  Oriented to  Time, Oriented to Situation, Oriented to Place, Oriented to Self Alcohol / Substance use:  Not Applicable Psych involvement (Current and /or in the community):  No (Comment)  Discharge Needs  Concerns to be addressed:  Care Coordination Readmission within the last 30 days:  No Current discharge risk:  Dependent with Mobility, Physical Impairment Barriers to Discharge:  No Barriers Identified   Anthony Baxter, LCSW 12/04/2016, 10:39 AM

## 2016-12-04 NOTE — Plan of Care (Signed)
Problem: Pain Management: Goal: Pain level will decrease Patient reports no pain. Understands pain scale, and asks for pain medicine when needed.

## 2016-12-04 NOTE — Care Management Important Message (Signed)
Important Message  Patient Details  Name: Anthony Galvan MRN: [id number] Date of Birth: [birth year]   Medicare Important Message Given:  Yes    Orbie Pyo 12/04/2016, 12:44 PM

## 2016-12-04 NOTE — Progress Notes (Signed)
Orthopedic Trauma Service Progress Note   Patient ID: KALAN YELEY MRN: [id number] DOB/AGE: 1921/05/27 83 y.o.  Subjective:  Doing great Ready to go to skilled nursing Center Appetite is good Voiding without difficulty  Denies pain in his right hip  Has been sitting in his bedside chair since 1100 hrs.   Review of Systems  Constitutional: Negative for chills and fever.  Respiratory: Negative for shortness of breath and wheezing.   Cardiovascular: Negative for chest pain and palpitations.  Gastrointestinal: Negative for abdominal pain, nausea and vomiting.  Neurological: Negative for tingling and sensory change.    Objective:   VITALS:   Vitals:   12/03/16 1853 12/03/16 1900 12/04/16 0300 12/04/16 1419  BP: 121/66 (!) 121/47 (!) 126/56 136/63  Pulse: 75 83 69 83  Resp: 16 16 18 18   Temp: 98 F (36.7 C) 99.2 F (37.3 C) 98.5 F (36.9 C) 98.1 F (36.7 C)  TempSrc: Oral Oral Oral Oral  SpO2: 99% 99% 100% 95%  Weight:      Height:        Intake/Output      08/19 0701 - 08/20 0700 08/20 0701 - 08/21 0700   P.O. 240 480   Total Intake(mL/kg) 240 (3.2) 480 (6.5)   Urine (mL/kg/hr) 800 (0.4) 300 (0.5)   Total Output 800 300   Net -560 +180        Stool Occurrence  1 x     LABS  Results for DIEM, PAGNOTTA (MRN [id number]) as of 12/04/2016 14:48  Ref. Range 12/02/2016 02:06  Vitamin D, 25-Hydroxy Latest Ref Range: 30.0 - 100.0 ng/mL 15.4 (L)   PHYSICAL EXAM:   Gen: awake and alert, sitting comfortably in bedside chair Lungs:Breathing is unlabored Cardiac: regular LPF:XTKW, nontender, + bowel sounds Ext:       Right lower extremity  Dressings are stable  Scant drainage right hip  Distal motor and sensory functions are grossly intact  Extremity is warm  + DP pulse  No deep calf tenderness  Swelling is stable   Assessment/Plan: 3 Days Post-Op   Principal Problem:   Closed disp intertrochanteric fracture of right femur with  nonunion Active Problems:   Hyperlipidemia with target low density lipoprotein (LDL) cholesterol less than 70 mg/dL   Essential hypertension   CKD (chronic kidney disease) stage 3, GFR 30-59 ml/min   CAD in native artery   Anti-infectives    Start     Dose/Rate Route Frequency Ordered Stop   12/01/16 1800  ceFAZolin (ANCEF) IVPB 1 g/50 mL premix     1 g 100 mL/hr over 30 Minutes Intravenous Every 8 hours 12/01/16 1403 12/02/16 1759   12/01/16 0927  ceFAZolin (ANCEF) 2-4 GM/100ML-% IVPB    Comments:  Forte, Lindsi   : cabinet override      12/01/16 0927 12/01/16 2129    .  POD/HD#: 12  83 year old black male ground-level fall with comminuted right intertrochanteric femur fracture   - Comminuted right intertrochanteric femur fracture s/p intramedullary nail             weight-bear as tolerated right leg with assistance  Unrestricted range of motion right hip and knee  Dressing changes as needed   Okay to shower and clean wounds with soap and water only   No ointments or solution should be applied to the wounds   Continue with regular physical therapy and occupational therapy  Ice as needed  TED hose if available    - Pain  management:             Minimize narcs             Tylenol               - ABL anemia/Hemodynamics             asymptomatic   - Medical issues              Per medical service    - DVT/PE prophylaxis:              lovenox x 21 days    - ID:              periop abx completed   - Metabolic Bone Disease:             Osteoporosis   Vitamin D deficiency present- supplement with vitamin D3 5000 IUs daily. Recheck labs in 12 weeks. Additional workup if vitamin D remains low. Recommend outpatient DEXA scan.    - Dispo:             okay to discharge to skilled [facility] today  Up with orthopedics in 10-14 days  Prescriptions including pain medicine, Lovenox and vitamin D are on patient's chart    Jari Pigg, PA-C [facility] [phone] (O) 12/04/2016, 2:50 PM

## 2016-12-04 NOTE — Social Work (Signed)
Clinical Social Worker facilitated patient discharge including contacting patient family and facility to confirm patient discharge plans.  Clinical information faxed to facility and family agreeable with plan.    CSW arranged ambulance transport via [facility] to [facility].    RN to call [phone] to give report prior to discharge.  Clinical Social Worker will sign off for now as social work intervention is no longer needed. Please consult us again if new need arises.  Joy Reiger, LCSW Clinical Social Worker [phone]    

## 2016-12-05 ENCOUNTER — Encounter: Payer: Self-pay | Admitting: Internal Medicine

## 2016-12-05 ENCOUNTER — Encounter (HOSPITAL_COMMUNITY)
Admission: RE | Admit: 2016-12-05 | Discharge: 2016-12-05 | Disposition: A | Discharge status: Home / Self Care | Payer: Medicare Other | Source: Skilled Nursing Facility | Attending: Internal Medicine | Admitting: Internal Medicine

## 2016-12-05 ENCOUNTER — Non-Acute Institutional Stay (SKILLED_NURSING_FACILITY): Payer: Medicare Other | Admitting: Internal Medicine

## 2016-12-05 DIAGNOSIS — I200 Unstable angina: Secondary | ICD-10-CM

## 2016-12-05 DIAGNOSIS — K279 Peptic ulcer, site unspecified, unspecified as acute or chronic, without hemorrhage or perforation: Secondary | ICD-10-CM

## 2016-12-05 DIAGNOSIS — N183 Chronic kidney disease, stage 3 unspecified: Secondary | ICD-10-CM

## 2016-12-05 DIAGNOSIS — I10 Essential (primary) hypertension: Secondary | ICD-10-CM

## 2016-12-05 DIAGNOSIS — S72141K Displaced intertrochanteric fracture of right femur, subsequent encounter for closed fracture with nonunion: Secondary | ICD-10-CM | POA: Diagnosis not present

## 2016-12-05 DIAGNOSIS — I2510 Atherosclerotic heart disease of native coronary artery without angina pectoris: Secondary | ICD-10-CM | POA: Insufficient documentation

## 2016-12-05 DIAGNOSIS — E785 Hyperlipidemia, unspecified: Secondary | ICD-10-CM

## 2016-12-05 DIAGNOSIS — Z4789 Encounter for other orthopedic aftercare: Secondary | ICD-10-CM | POA: Insufficient documentation

## 2016-12-05 DIAGNOSIS — Z955 Presence of coronary angioplasty implant and graft: Secondary | ICD-10-CM | POA: Insufficient documentation

## 2016-12-05 LAB — CBC WITH DIFFERENTIAL/PLATELET
BASOS ABS: 0 10*3/uL (ref 0.0–0.1)
BASOS PCT: 0 %
Eosinophils Absolute: 0.4 10*3/uL (ref 0.0–0.7)
Eosinophils Relative: 4 %
HEMATOCRIT: 34 % — AB (ref 39.0–52.0)
HEMOGLOBIN: 10.8 g/dL — AB (ref 13.0–17.0)
Lymphocytes Relative: 23 %
Lymphs Abs: 2.4 10*3/uL (ref 0.7–4.0)
MCH: 28.6 pg (ref 26.0–34.0)
MCHC: 31.8 g/dL (ref 30.0–36.0)
MCV: 89.9 fL (ref 78.0–100.0)
MONO ABS: 0.9 10*3/uL (ref 0.1–1.0)
Monocytes Relative: 9 %
NEUTROS ABS: 6.8 10*3/uL (ref 1.7–7.7)
NEUTROS PCT: 64 %
Platelets: 136 10*3/uL — ABNORMAL LOW (ref 150–400)
RBC: 3.78 MIL/uL — AB (ref 4.22–5.81)
RDW: 11.8 % (ref 11.5–15.5)
WBC: 10.5 10*3/uL (ref 4.0–10.5)

## 2016-12-05 LAB — BASIC METABOLIC PANEL
ANION GAP: 8 (ref 5–15)
BUN: 20 mg/dL (ref 6–20)
CALCIUM: 8.7 mg/dL — AB (ref 8.9–10.3)
CO2: 31 mmol/L (ref 22–32)
Chloride: 98 mmol/L — ABNORMAL LOW (ref 101–111)
Creatinine, Ser: 1.07 mg/dL (ref 0.61–1.24)
GFR calc non Af Amer: 58 mL/min — ABNORMAL LOW (ref >60)
Glucose, Bld: 100 mg/dL — ABNORMAL HIGH (ref 65–99)
POTASSIUM: 4.2 mmol/L (ref 3.5–5.1)
Sodium: 137 mmol/L (ref 135–145)

## 2016-12-05 LAB — CALCITRIOL (1,25 DI-OH VIT D): VIT D 1 25 DIHYDROXY: 46.8 pg/mL (ref 19.9–79.3)

## 2016-12-05 NOTE — Progress Notes (Signed)
Provider: Veleta Miners  Location:   [facility] Room Number: 134/P Place of Service:  SNF (31)  PCP: [name], MD Patient Care Team: [name], MD as PCP - General (Internal Medicine) Danie Binder, MD (Gastroenterology)  Extended Emergency Contact Information Primary Emergency Contact: Tomasa Hose, [place] [address] [place] Phone: [phone] Mobile Phone: [phone] Relation: Daughter Secondary Emergency Contact: Purcell,[place] Address: [address]          [place] [address] [place] Phone: [phone] Relation: Grandson  Code Status: Full Code Goals of Care: Advanced Directive information Advanced Directives 12/05/2016  Does Patient Have a Medical Advance Directive? Yes  Type of Advance Directive (No Data)  Does patient want to make changes to medical advance directive? No - Patient declined  Would patient like information on creating a medical advance directive? No - Patient declined  Pre-existing out of facility DNR order (yellow form or pink MOST form) -       Chief Complaint  Patient presents with  . New Admit To SNF    New Admission    HPI: Patient is a 83 y.o. male seen today for admission to SNF for therapy after undergoing  Patient has h/o CAD S/P PTCA and stent placement in LAD in 2014,s/p Cardiac cath showing 2 vessel disesa in 07/18 on medical therapy,  Hypertension, H/O GI Bleed due to Duodenal ulcer in 06/18,  Hyperlipidemia,   He presented to ED after Mechanical fall and Right hip pain. He was found to have right Femoral Intertrochanteric fracture. He underwent Intramedullary Nail Placement on 08/17. Post op Course was uneventful and he was discharged to SNF for therapy.  Patient doing well in facility. He denies any uncontrollable Pain. He lives with his grandson at home. His daughter helps him get around. He is very independent. Does all his  finances  Past Medical History:  Diagnosis Date  . CAD S/P percutaneous coronary angioplasty 08/01/2012   PCI of mid LAD with a VeriFlex Bare Metal Stent 3.0 mm x 12 mm - post-dilated to 3.5 mm.  . Duodenal ulcer May 2011   on EGD  . GERD (gastroesophageal reflux disease)   . Helicobacter pylori gastritis MAY 2012 HP STOOL AG NEG  . HTN (hypertension)   . Hyperlipidemia   . MI (myocardial infarction) ([place]) 1976, 1980, [place] for at [facility] and [place] (Dr. Iona Beard)  . Osteoarthritis   . S/P colonoscopy 2011   normal, internal hemorrhoids   Past Surgical History:  Procedure Laterality Date  . CARDIAC CATHETERIZATION  01/30/2007   D1 75 %, mid LAD 50%, 50-70% circumflex, 50-70% RCA.(Dr. Adora Fridge)  . CARDIAC CATHETERIZATION  12/11/2001   normal L main, small RCA, dominant LL Cfx with mild diffuse disease, LAD with mid 10-20% stenosis, ramus intermedius with mild diffuse disease (Dr. Jackie Plum)  . CATARACT EXTRACTION    . COLONOSCOPY  OCT 2011 ARS   [facility] [place]   in setting of MI  . ESOPHAGOGASTRODUODENOSCOPY N/A 10/09/2016   Procedure: ESOPHAGOGASTRODUODENOSCOPY (EGD);  Surgeon: Danie Binder, MD;  [facility];  Service: Endoscopy;  Laterality: N/A;  . FEMUR IM NAIL Right 12/01/2016   Procedure: INTRAMEDULLARY (IM) NAIL RIGHT HIP;  Surgeon: Altamese Naschitti, MD;  Location: [facility];  Service: Orthopedics;  Laterality: Right;  . LEFT HEART CATH AND  CORONARY ANGIOGRAPHY N/A 10/23/2016   Procedure: Left Heart Cath and Coronary Angiography;  Surgeon: Sherren Mocha, MD;  Location: [facility] CV LAB;  Service: Cardiovascular;  Laterality: N/A;  . LEFT HEART CATHETERIZATION WITH CORONARY ANGIOGRAM N/A 08/01/2012   Procedure: LEFT HEART CATHETERIZATION WITH CORONARY ANGIOGRAM;  Surgeon: Leonie Man, MD;  Location: [facility] CATH LAB: Mid LAD 99% apple core; D2 ostial 70-80% (2 small for PCI) small nondominant RCA 60-70%. Distal circumflex/L  PDA ~50% --> PCI of LAD  . NM MYOCAR PERF WALL MOTION  08/2003   adenosine stress - focal decreased perfusion defect in distal inferior wall, no significant ischemic changes  . PERCUTANEOUS STENT INTERVENTION  08/01/2012   Procedure: PERCUTANEOUS STENT INTERVENTION;  Surgeon: Leonie Man, MD;  Location: [facility] CATH LAB; ;PCI of mid LAD with a VeriFlex Bare Metal Stent 3.0 mm x 12 mm - post-dilated to 3.5 mm.  . TRANSTHORACIC ECHOCARDIOGRAM  April 2014   normal LV size and function. EF 60-65%. Grade 1 diastolic soft. Mild aortic valve calcification  . UPPER GASTROINTESTINAL ENDOSCOPY  MAY 2011 MELENA, HEMATEMESIS   DUODENAL ULCER, Bx: H PYLORI POS    reports that he has never smoked. He has never used smokeless tobacco. He reports that he does not drink alcohol or use drugs. Social History   Social History  . Marital status: Widowed    Spouse name: N/A  . Number of children: 3  . Years of education: N/A   Occupational History  . Not on file.   Social History Main Topics  . Smoking status: Never Smoker  . Smokeless tobacco: Never Used  . Alcohol use No  . Drug use: No  . Sexual activity: Not on file   Other Topics Concern  . Not on file   Social History Narrative   Is a father of 3 with 3 grandchildren 3 great-grandchildren. He lives with one of his 2 grandsons.   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 He was a war 2 veteran in the [place]. 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   He never smoked. He does not drink alcohol.   He is very active, and a majority. He walks 30+ minutes every day. He also likes to play golf.    Functional Status Survey:    Family History  Problem Relation Age of Onset  . Heart disease Mother   . Kidney disease Father   . Heart disease Brother 76  . Heart disease Sister   . Colon cancer Neg Hx     Health Maintenance   Topic Date Due  . INFLUENZA VACCINE  03/17/2017 (Originally 11/15/2016)  . TETANUS/TDAP  03/17/2017 (Originally 02/27/1941)  . PNA vac Low Risk Adult (1 of 2 - PCV13) 03/17/2017 (Originally 02/26/1989)    No Known Allergies  Allergies as of 12/05/2016   No Known Allergies     Medication List       Accurate as of 12/05/16  1:31 PM. Always use your most recent med list.          acetaminophen 325 MG tablet Commonly known as:  TYLENOL Take 2 tablets (650 mg total) by mouth 3 (three) times daily as needed (pain).   amLODipine 5 MG tablet Commonly known as:  NORVASC Take 1 tablet (5 mg total) by mouth daily.   atorvastatin 80  MG tablet Commonly known as:  LIPITOR Take 1 tablet (80 mg total) by mouth daily at 6 PM.   cetirizine 5 MG tablet Commonly known as:  ZYRTEC Take 5 mg by mouth daily.   Cholecalciferol 5000 units Tabs Take 1 tablet (5,000 Units total) by mouth daily.   donepezil 10 MG tablet Commonly known as:  ARICEPT Take 5 mg by mouth daily.   enoxaparin 40 MG/0.4ML injection Commonly known as:  LOVENOX Inject 0.4 mLs (40 mg total) into the skin daily.   HYDROcodone-acetaminophen 5-325 MG tablet Commonly known as:  NORCO/VICODIN Take 1-2 tablets by mouth every 6 (six) hours as needed for moderate pain.   isosorbide mononitrate 30 MG 24 hr tablet Commonly known as:  IMDUR Take 2 tablets (60 mg total) by mouth daily.   metoprolol tartrate 50 MG tablet Commonly known as:  LOPRESSOR Take 50 mg by mouth 2 (two) times daily.   multivitamin with minerals Tabs tablet Take 1 tablet by mouth daily.   NITROSTAT 0.4 MG SL tablet Generic drug:  nitroGLYCERIN PLACE 1 TABLET UNDER TONGUE EVERY 5 MINUTES FOR 3 DOSES AS NEEDED.   pantoprazole 40 MG tablet Commonly known as:  PROTONIX Take 1 tablet (40 mg total) by mouth 2 (two) times daily before a meal.       Review of Systems  Review of Systems  Constitutional: Negative for activity change, appetite  change, chills, diaphoresis, fatigue and fever.  HENT: Negative for mouth sores, postnasal drip, rhinorrhea, sinus pain and sore throat.   Respiratory: Negative for apnea, cough, chest tightness, shortness of breath and wheezing.   Cardiovascular: Negative for chest pain, palpitations and leg swelling.  Gastrointestinal: Negative for abdominal distention, abdominal pain, constipation, diarrhea, nausea and vomiting.  Genitourinary: Negative for dysuria and frequency.  Musculoskeletal: Negative for arthralgias, joint swelling and myalgias.  Skin: Negative for rash.  Neurological: Negative for dizziness, syncope, weakness, light-headedness and numbness.  Psychiatric/Behavioral: Negative for behavioral problems, confusion and sleep disturbance.     Vitals:   12/05/16 1027  BP: (!) 168/89 Repeated Manually it was 138/60  Pulse: (!) 105  Resp: 20  Temp: (!) 96.9 F (36.1 C)  TempSrc: Oral   There is no height or weight on file to calculate BMI. Physical Exam  Constitutional: He is oriented to person, place, and time. He appears well-developed and well-nourished.  HENT:  Head: Normocephalic.  Mouth/Throat: Oropharynx is clear and moist.  Eyes: Pupils are equal, round, and reactive to light.  Neck: Neck supple.  Cardiovascular: Normal rate, regular rhythm and normal heart sounds.   No murmur heard. Pulmonary/Chest: Effort normal and breath sounds normal. No respiratory distress. He has no wheezes. He has no rales.  Abdominal: Soft. Bowel sounds are normal. He exhibits no distension. There is no tenderness. There is no rebound.  Musculoskeletal: He exhibits no edema.  Neurological: He is alert and oriented to person, place, and time.  Patient has good strength in all extremities. Except the one with Surgery.  Skin: Skin is warm and dry.  Psychiatric: He has a normal mood and affect. Thought content normal.    Labs reviewed: Basic Metabolic Panel:  Recent Labs  10/24/16 0227  11/30/16 1230 12/05/16 0611  NA 138 137 137  K 4.5 4.8 4.2  CL 103 101 98*  CO2 29 27 31   GLUCOSE 111* 95 100*  BUN 14 15 20   CREATININE 1.25* 1.21 1.07  CALCIUM 8.3* 8.8* 8.7*   Liver Function Tests:  Recent  Labs  10/08/16 1500  AST 21  ALT 11*  ALKPHOS 50  BILITOT 2.2*  PROT 8.1  ALBUMIN 4.3    Recent Labs  10/08/16 1500  LIPASE 44   No results for input(s): AMMONIA in the last 8760 hours. CBC:  Recent Labs  11/30/16 1230 12/01/16 0935 12/05/16 0611  WBC 14.7* 11.7* 10.5  NEUTROABS 11.3*  --  6.8  HGB 12.9* 12.7* 10.8*  HCT 40.9 39.5 34.0*  MCV 91.5 89.0 89.9  PLT 151 128* 136*   Cardiac Enzymes:  Recent Labs  10/20/16 1640 10/20/16 2220 10/21/16 0423  TROPONINI 0.08* 0.07* 0.07*   BNP: Invalid input(s): POCBNP Lab Results  Component Value Date   HGBA1C 5.4 08/01/2012   Lab Results  Component Value Date   TSH 1.447 08/01/2012   No results found for: VITAMINB12 No results found for: FOLATE No results found for: IRON, TIBC, FERRITIN  Imaging and Procedures obtained prior to SNF admission: No results found.  Assessment/Plan Intertrochanteric Fracture of Right Femur S/P Intramedullary repair. Patient Pain is controlled. He will be working with therapy in the Facility. Follow up with Ortho Also On Lovenox injections for DVT prophylaxis as it seems patient has h/o GiI bleed.   CAD Stable and Asymptomatic now. On medical management with Lopressor and ,Isosorbide,  Essential hypertension BP controlled on Norvasc.  PUD (peptic ulcer disease) On Protonix Continue to watch Hgb. But patient has been sables for past few months.  CKD (chronic kidney disease) stage 3, GFR 30-59 ml/min Creat back to baseline  Hyperlipidemia On Lipitor.  ?Dementia Patient is on Aricept.  Anemia Hgb down to 10.8 with mild low platelets Most likely Post op. With  follow with CBC  Discharge plan Plan for discharge home.      Family/ staff  Communication:   Labs/tests ordered: Follow CBC, CMP, on Mon  Total time spent in this patient care encounter was 45_ minutes; greater than 50% of the visit spent counseling patient, reviewing records , Labs and coordinating care for problems addressed at this encounter.

## 2016-12-11 ENCOUNTER — Encounter (HOSPITAL_COMMUNITY)
Admission: RE | Admit: 2016-12-11 | Discharge: 2016-12-11 | Disposition: A | Discharge status: Home / Self Care | Payer: Medicare Other | Source: Skilled Nursing Facility | Attending: Internal Medicine | Admitting: Internal Medicine

## 2016-12-11 LAB — COMPREHENSIVE METABOLIC PANEL
ALT: 14 U/L — ABNORMAL LOW (ref 17–63)
ANION GAP: 11 (ref 5–15)
AST: 27 U/L (ref 15–41)
Albumin: 3.7 g/dL (ref 3.5–5.0)
Alkaline Phosphatase: 77 U/L (ref 38–126)
BUN: 21 mg/dL — ABNORMAL HIGH (ref 6–20)
CALCIUM: 8.9 mg/dL (ref 8.9–10.3)
CO2: 26 mmol/L (ref 22–32)
Chloride: 100 mmol/L — ABNORMAL LOW (ref 101–111)
Creatinine, Ser: 1.15 mg/dL (ref 0.61–1.24)
GFR calc non Af Amer: 53 mL/min — ABNORMAL LOW (ref >60)
GLUCOSE: 100 mg/dL — AB (ref 65–99)
POTASSIUM: 3.9 mmol/L (ref 3.5–5.1)
SODIUM: 137 mmol/L (ref 135–145)
TOTAL PROTEIN: 7 g/dL (ref 6.5–8.1)
Total Bilirubin: 1.4 mg/dL — ABNORMAL HIGH (ref 0.3–1.2)

## 2016-12-11 LAB — CBC
HCT: 35.2 % — ABNORMAL LOW (ref 39.0–52.0)
Hemoglobin: 11.2 g/dL — ABNORMAL LOW (ref 13.0–17.0)
MCH: 29.2 pg (ref 26.0–34.0)
MCHC: 31.8 g/dL (ref 30.0–36.0)
MCV: 91.7 fL (ref 78.0–100.0)
Platelets: 255 10*3/uL (ref 150–400)
RBC: 3.84 MIL/uL — ABNORMAL LOW (ref 4.22–5.81)
RDW: 12.9 % (ref 11.5–15.5)
WBC: 13.6 10*3/uL — ABNORMAL HIGH (ref 4.0–10.5)

## 2016-12-14 ENCOUNTER — Other Ambulatory Visit: Payer: Self-pay

## 2016-12-14 MED ORDER — HYDROCODONE-ACETAMINOPHEN 5-325 MG PO TABS: 1.0000 | ORAL_TABLET | Freq: Four times a day (QID) | ORAL | 0 refills | Status: DC | PRN

## 2016-12-14 NOTE — Telephone Encounter (Signed)
RX Fax for [facility]@ [phone]  

## 2016-12-20 DIAGNOSIS — S72141D Displaced intertrochanteric fracture of right femur, subsequent encounter for closed fracture with routine healing: Secondary | ICD-10-CM | POA: Diagnosis not present

## 2016-12-28 NOTE — Op Note (Signed)
11/30/2016 - 12/01/2016  9:12 PM  PATIENT:  Anthony Galvan  83 y.o. male  PRE-OPERATIVE DIAGNOSIS:  RIGHT INTERTROCHANTERIC HIP FRACTURE  POST-OPERATIVE DIAGNOSIS:  RIGHT INTERTROCHANTERIC HIP FRACTURE  PROCEDURE:  Procedure(s): INTRAMEDULLARY (IM) NAIL RIGHT HIP (Right) USING BIOMET AFFIXUS 9 X 36mm statically locked nail  SURGEON:  Surgeon(s) and Role:    Altamese [place], MD - Primary  PHYSICIAN ASSISTANT: Ainsley Spinner, PA-C  ANESTHESIA:   general  I/O:  No intake/output data recorded.  SPECIMEN:  No Specimen  TOURNIQUET:  * No tourniquets in log *  DICTATION: .Note written in EPIC  PROCEDURE:  Intramedullary nailing of the right hip using a Biomet Affixus nail.  SURGEON:  Astrid Divine. Marcelino Scot, M.D.  ASSISTANT:  Ainsley Spinner, PA-C.  ANESTHESIA:  General.  COMPLICATIONS:  None.  ESTIMATED BLOOD LOSS:  Less than 150 mL.  DISPOSITION:  To PACU.  CONDITION:  Stable.  BRIEF SUMMARY AND INDICATION OF PROCEDURE:  Anthony Galvan is a 83 y.o. year- old with multiple medical problems.  I discussed with the patient and family risks and benefits of surgical treatment including the potential for malunion, nonunion, symptomatic hardware, heart attack, stroke, neurovascular injury, bleeding, and others.  After full discussion, the patient and family wished to proceed.  BRIEF SUMMARY OF PROCEDURE:  The patient was taken to the operating room where general anesthesia was induced.  He was positioned supine on the Hana fracture table.  A closed reduction maneuver was performed of the fractured proximal femur and this was confirmed on both AP and lateral xray views. A thorough scrub and wash with chlorhexidine and then Betadine scrub and paint was performed.  After sterile drapes and time-out, a long instrument was used to identify the appropriate starting position under C-arm on both AP and lateral images.  A 3 cm incision was made proximal to the greater trochanter.  The curved  cannulated awl was inserted just medial to the tip of the lateral trochanter and then the starting guidewire advanced into the proximal femur.  This was checked on AP and lateral views.  The starting reamer was engaged with the soft tissue protected by a sleeve.  The curved ball-tipped guidewire was then inserted, making sure it was just posterior as possible in the distal femur and across the fracture site, which stayed in a reduced position.  It was sequentially reamed up to 11 and an 9 mm nail inserted to the appropriate depth.  The guidewire for the lag screw was then inserted with the appropriate anteversion to make sure it was in a center-center position.  This was measured and the lag screw placed with excellent purchase and position checked on both views.  The antirotation screw was then engaged within the groove of the lag screw, which was allowed to telescope.  Traction was released and compression achieved with the screw.  This was followed by placement of one distal locking screw using perfect circle technique.  This was confirmed on AP and lateral images. Wounds were irrigated thoroughly, closed in a standard layered fashion. Sterile gently compressive dressings were applied.  Ainsley Spinner, PA-C, assisted throughout.  The patient was awakened from anesthesia and transported to the PACU in stable condition.  PROGNOSIS:  The patient will be weightbearing as tolerated with physical therapy beginning DVT prophylaxis as soon as deemed stable by the Primary Care Service.  He has no range of motion precautions.  We will continue to follow through at the hospital.  Anticipate follow  up in the office in 2 weeks for removal of sutures and further evaluation.     Astrid Divine. Marcelino Scot, M.D.

## 2017-01-01 ENCOUNTER — Other Ambulatory Visit: Payer: Self-pay

## 2017-01-01 MED ORDER — HYDROCODONE-ACETAMINOPHEN 5-325 MG PO TABS: ORAL_TABLET | ORAL | 0 refills | Status: DC

## 2017-01-01 NOTE — Telephone Encounter (Signed)
RX Fax for [facility]@ [phone]  

## 2017-01-04 ENCOUNTER — Encounter: Payer: Self-pay | Admitting: Internal Medicine

## 2017-01-04 ENCOUNTER — Non-Acute Institutional Stay (SKILLED_NURSING_FACILITY): Payer: Medicare Other | Admitting: Internal Medicine

## 2017-01-04 DIAGNOSIS — K264 Chronic or unspecified duodenal ulcer with hemorrhage: Secondary | ICD-10-CM | POA: Diagnosis not present

## 2017-01-04 DIAGNOSIS — I2510 Atherosclerotic heart disease of native coronary artery without angina pectoris: Secondary | ICD-10-CM | POA: Diagnosis not present

## 2017-01-04 DIAGNOSIS — S72141K Displaced intertrochanteric fracture of right femur, subsequent encounter for closed fracture with nonunion: Secondary | ICD-10-CM | POA: Diagnosis not present

## 2017-01-04 DIAGNOSIS — I10 Essential (primary) hypertension: Secondary | ICD-10-CM

## 2017-01-04 DIAGNOSIS — N183 Chronic kidney disease, stage 3 unspecified: Secondary | ICD-10-CM

## 2017-01-04 NOTE — Progress Notes (Signed)
Location:   [facility] Room Number: 789/F Place of Service:  SNF (31)  Provider: Sueko Dimichele,Rilee Wendling  PCP: [name], MD Patient Care Team: [name], MD as PCP - General (Internal Medicine) Danie Binder, MD (Gastroenterology)  Extended Emergency Contact Information Primary Emergency Contact: Tomasa Hose, [place] [address] [place] of [place] Phone: [phone] Mobile Phone: [phone] Relation: Daughter Secondary Emergency Contact: Purcell,Kelten Address: [address]          [place], [place] [address] [place] Phone: [phone] Relation: Grandson  Code Status: Full Code Goals of care:  Advanced Directive information Advanced Directives 01/04/2017  Does Patient Have a Medical Advance Directive? Yes  Type of Advance Directive (No Data)  Does patient want to make changes to medical advance directive? No - Patient declined  Would patient like information on creating a medical advance directive? No - Patient declined  Pre-existing out of facility DNR order (yellow form or pink MOST form) -     No Known Allergies  Chief Complaint  Patient presents with  . Discharge Note    Discharged Visit    HPI:  83 y.o. male  seen today for discharge from facility later this week.  He has been here for rehabilitation after sustaining a right femur fracture after a fall-she did have IM nail placement and postop course was quite unremarkable.  He is now weightbearing as tolerated will need continued PT and OT at home.  He also will need a rolling walker for ambulation.  He lives with his grandson and also has a very supportive daughter.  He is receiving Norco for pain apparently he does take this every day.  His other medical issues include history coronary artery disease status post placement-hypertension-history of GI bleed because of duodenal ulcer back in June 2018 as well as hyperlipidemia.  He is  looking forward to going home he has no complaints other than occasional pain which i is relieved with the Norco.  Regards to hypertension this is been stable on Lopressor Imdur as well as Norvasc recent blood pressures 131/64-133/64.  In regards to the GI bleed CBC has been stable we will recheck this before discharge.  As a listed history of dementia but this appears to be quite mild actually he did his own finances at home. He is on Aricept  Also has a history of chronic kidney disease most recent creatinine 1.15 appear to be relatively stable back in late August will update this before discharge as well.  Currently he is lying in bed comfortably is able to get up without assistance and uses walker appears to be doing recently well with this.     .      Past Medical History:  Diagnosis Date  . CAD S/P percutaneous coronary angioplasty 08/01/2012   PCI of mid LAD with a VeriFlex Bare Metal Stent 3.0 mm x 12 mm - post-dilated to 3.5 mm.  . Duodenal ulcer May 2011   on EGD  . GERD (gastroesophageal reflux disease)   . Helicobacter pylori gastritis MAY 2012 HP STOOL AG NEG  . HTN (hypertension)   . Hyperlipidemia   . MI (myocardial infarction) ([place]) 1976, 1980, [place] for at [facility] and [facility] (Dr. Iona Beard)  . Osteoarthritis   . S/P colonoscopy 2011   normal, internal hemorrhoids    Past Surgical History:  Procedure Laterality Date  . CARDIAC  CATHETERIZATION  01/30/2007   D1 75 %, mid LAD 50%, 50-70% circumflex, 50-70% RCA.(Dr. Adora Fridge)  . CARDIAC CATHETERIZATION  12/11/2001   normal L main, small RCA, dominant LL Cfx with mild diffuse disease, LAD with mid 10-20% stenosis, ramus intermedius with mild diffuse disease (Dr. Jackie Plum)  . CATARACT EXTRACTION    . COLONOSCOPY  OCT 2011 ARS   [facility] [place]   in setting of MI  . ESOPHAGOGASTRODUODENOSCOPY N/A 10/09/2016   Procedure: ESOPHAGOGASTRODUODENOSCOPY (EGD);  Surgeon:  Danie Binder, MD;  [facility];  Service: Endoscopy;  Laterality: N/A;  . FEMUR IM NAIL Right 12/01/2016   Procedure: INTRAMEDULLARY (IM) NAIL RIGHT HIP;  Surgeon: Altamese [place], MD;  Location: [facility];  Service: Orthopedics;  Laterality: Right;  . LEFT HEART CATH AND CORONARY ANGIOGRAPHY N/A 10/23/2016   Procedure: Left Heart Cath and Coronary Angiography;  Surgeon: Sherren Mocha, MD;  Location: [facility] CV LAB;  Service: Cardiovascular;  Laterality: N/A;  . LEFT HEART CATHETERIZATION WITH CORONARY ANGIOGRAM N/A 08/01/2012   Procedure: LEFT HEART CATHETERIZATION WITH CORONARY ANGIOGRAM;  Surgeon: Leonie Man, MD;  Location: [facility] CATH LAB: Mid LAD 99% apple core; D2 ostial 70-80% (2 small for PCI) small nondominant RCA 60-70%. Distal circumflex/L PDA ~50% --> PCI of LAD  . NM MYOCAR PERF WALL MOTION  08/2003   adenosine stress - focal decreased perfusion defect in distal inferior wall, no significant ischemic changes  . PERCUTANEOUS STENT INTERVENTION  08/01/2012   Procedure: PERCUTANEOUS STENT INTERVENTION;  Surgeon: Leonie Man, MD;  Location: [facility] CATH LAB; ;PCI of mid LAD with a VeriFlex Bare Metal Stent 3.0 mm x 12 mm - post-dilated to 3.5 mm.  . TRANSTHORACIC ECHOCARDIOGRAM  April 2014   normal LV size and function. EF 60-65%. Grade 1 diastolic soft. Mild aortic valve calcification  . UPPER GASTROINTESTINAL ENDOSCOPY  MAY 2011 MELENA, HEMATEMESIS   DUODENAL ULCER, Bx: H PYLORI POS      reports that he has never smoked. He has never used smokeless tobacco. He reports that he does not drink alcohol or use drugs. Social History   Social History  . Marital status: Widowed    Spouse name: N/A  . Number of children: 3  . Years of education: N/A   Occupational History  . Not on file.   Social History Main Topics  . Smoking status: Never Smoker  . Smokeless tobacco: Never Used  . Alcohol use No  . Drug use: No  . Sexual activity: Not on file   Other Topics Concern    . Not on file   Social History Narrative   Is a father of 3 with 3 grandchildren 3 great-grandchildren. He lives with one of his 2 grandsons.   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 He was a war 2 veteran in the [place]. 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   He never smoked. He does not drink alcohol.   He is very active, and a majority. He walks 30+ minutes every day. He also likes to play golf.   Functional Status Survey:    No Known Allergies  Pertinent  Health Maintenance Due  Topic Date Due  . INFLUENZA VACCINE  03/17/2017 (Originally 11/15/2016)  . PNA vac Low Risk Adult (1 of 2 -  PCV13) 03/17/2017 (Originally 02/26/1989)    Medications: Outpatient Encounter Prescriptions as of 01/04/2017  Medication Sig  . acetaminophen (TYLENOL) 325 MG tablet Take 2 tablets (650 mg total) by mouth 3 (three) times daily as needed (pain).  Marland Kitchen amLODipine (NORVASC) 5 MG tablet Take 1 tablet (5 mg total) by mouth daily.  Marland Kitchen atorvastatin (LIPITOR) 80 MG tablet Take 1 tablet (80 mg total) by mouth daily at 6 PM.  . cetirizine (ZYRTEC) 5 MG tablet Take 10 mg by mouth daily.   . cholecalciferol 5000 units TABS Take 1 tablet (5,000 Units total) by mouth daily.  Marland Kitchen donepezil (ARICEPT) 10 MG tablet Take 5 mg by mouth daily.  Marland Kitchen HYDROcodone-acetaminophen (NORCO/VICODIN) 5-325 MG tablet Take 1 tablet by mouth every 6 hours as needed for moderate pain. Take  2 tablets by mouth every 6 hours as needed for severe pain.  . isosorbide mononitrate (IMDUR) 30 MG 24 hr tablet Take 2 tablets (60 mg total) by mouth daily.  . Menthol, Topical Analgesic, (BIOFREEZE) 4 % GEL Apply to BLE/PT clarification once a day  . metoprolol (LOPRESSOR) 50 MG tablet Take 50 mg by mouth 2 (two) times daily.  . Multiple Vitamin (MULTIVITAMIN WITH MINERALS) TABS tablet Take 1 tablet by mouth daily.  Marland Kitchen  NITROSTAT 0.4 MG SL tablet PLACE 1 TABLET UNDER TONGUE EVERY 5 MINUTES FOR 3 DOSES AS NEEDED.  Marland Kitchen pantoprazole (PROTONIX) 40 MG tablet Take 1 tablet (40 mg total) by mouth 2 (two) times daily before a meal.  . [DISCONTINUED] enoxaparin (LOVENOX) 40 MG/0.4ML injection Inject 0.4 mLs (40 mg total) into the skin daily.   No facility-administered encounter medications on file as of 01/04/2017.      Review of Systems   In general says he feels well does not complaining of any fever or chills.  Skin does not complain of rashes or itching surgical site right appear he appears to be healed unremarkably.  Head ears eyes nose mouth and throat does not complaining of visual changes or sore throat or nasal discharge.  Respiratory denies any shortness breath or cough.  Cardiac denies chest pain does not really have significant lower extremity edema.  GI does not complaining of any abdominal discomfort nausea vomiting diarrhea constipation.  GU is not complaining of dysuria.  Muscle skeletal at times will complain of some right hip discomfort Norco apparently does help with this.  Neurologic is not complaining of dizziness headache syncope or numbness.  Psych can use to be in good spirits has a history of dementia but this appears to be quite mild he is on Aricept.    Vitals:   01/04/17 1522  BP: 131/64  Pulse: 64  Resp: 18  Temp: 98.2 F (36.8 C)  TempSrc: Oral  SpO2: 90%  Weight: 169 lb 6.4 oz (76.8 kg)   Body mass index is 29.08 kg/m. Physical Exam  In general this is a very pleasant elderly male who looks younger than his stated age.  His skin is warm and dry there are well-healed surgical scars site of surgery right femur.  No sign of infection.  Eyes pupils appear reactive light sclera and conjunctiva are clear visual acuity appears grossly intact.  Oropharynx is clear mucous membranes moist he has numerous extractions.  Chest is clear to auscultation there is no labored  breathing.  Heart is rate and rhythm without murmur gallop or rub pedal pulses are palpable edema is very minimal.  Abdomen is soft nontender with positive bowel sounds.  Muscle skeletal does move all extremities 4 is able to get up and ambulate with a walker and does relatively well strength appears preserved all 4 extremities.  Neurologic is grossly intact speech is clear no lateralizing findings.  Psych appears alert and oriented pleasant and appropriate I would classify his dementia as quite mild  Labs reviewed: Basic Metabolic Panel:  Recent Labs  11/30/16 1230 12/05/16 0611 12/11/16 0730  NA 137 137 137  K 4.8 4.2 3.9  CL 101 98* 100*  CO2 27 31 26   GLUCOSE 95 100* 100*  BUN 15 20 21*  CREATININE 1.21 1.07 1.15  CALCIUM 8.8* 8.7* 8.9   Liver Function Tests:  Recent Labs  10/08/16 1500 12/11/16 0730  AST 21 27  ALT 11* 14*  ALKPHOS 50 77  BILITOT 2.2* 1.4*  PROT 8.1 7.0  ALBUMIN 4.3 3.7    Recent Labs  10/08/16 1500  LIPASE 44   No results for input(s): AMMONIA in the last 8760 hours. CBC:  Recent Labs  11/30/16 1230 12/01/16 0935 12/05/16 0611 12/11/16 0730  WBC 14.7* 11.7* 10.5 13.6*  NEUTROABS 11.3*  --  6.8  --   HGB 12.9* 12.7* 10.8* 11.2*  HCT 40.9 39.5 34.0* 35.2*  MCV 91.5 89.0 89.9 91.7  PLT 151 128* 136* 255   Cardiac Enzymes:  Recent Labs  10/20/16 1640 10/20/16 2220 10/21/16 0423  TROPONINI 0.08* 0.07* 0.07*   BNP: Invalid input(s): POCBNP CBG: No results for input(s): GLUCAP in the last 8760 hours.  Procedures and Imaging Studies During Stay: No results found.  Assessment/Plan:    #1-history of right femur fracture surgically repaired he appears to be doing well postoperatively does have Norco as needed for pain apparently he does take this every day-will need continued PT and OT as well as a rolling walker to assist with ambulation also has orthopedic follow-up he is weightbearing as tolerated.  #2 history  coronary artery disease he is status post stenting-she is on medical therapy and appears to be asymptomatic he is on Imdur as well as Lopressor and Norvasc.  #3 hypertension as noted above this is stable on Lopressor Imdur and Norvasc.  #4 history of GI bleed secondary to duodenal ulcer back in June 2018-will check a CBC before discharge hemoglobin has been stable it appears no further reports of bleeding to my knowledge.  #5 history of hyperlipidemia he is on Lipitor since his stay here is quite short will defer follow-up to primary care provider.  #6-history of dementia this appears quite mild he is on Aricept he will be with his grandson apparently at home and has a very supportive daughter but did his own finances apparently.  #7-history of chronic kidney disease this appears stable creatinine of about 1.15  In August baseline creatinine appears 1.1-1.2 area is baseline --will recheck this before discharge  #8-anemia as noted above this appears stable we will recheck hemoglobin before discharge.  #9 history of allergic rhinitis apparently chronic he is on Zyrtec and apparently this is stable.  #10 history of mildly elevated bilirubin on previous lab will update this before discharge as well.  #11 history vitamin D deficiency he is on supplementation I the level most recently was 15.4-again has been started on supplementation Will defer to primary care provider since he is about to leave facility.   Of note it appears that he has been on oxygen when necessary at night apparently has been on this long-term at home  Again he will  need PT and OT for further strengthening with his history of right femur repair- He also will have strong family support this with his grandson apparently and has a very supportive daughter he will need a rolling walker to assist with ambulation.    [id number] note greater than 30 minutes spent on this discharge summary-greater than 50% of time spent  coordinating plan of care for numerous diagnoses   Addendum-  labson September 21 are reassuring with a creatinine of 1.26 BUN of 23 which appears relatively his baseline this will need follow-up by primary care provider sodium is 137 potassium 4 liver function tests are within normal limits bilirubin now is 1.2   Additional addendum--patient-per nursing  Has not qualified  For oxygen--since saturations have been satisfactory on room air--this was discussed with  discharge nurse on 01-08-2017  Also hemoglobin has actually improved at 12.3-white count has come down to 11.2 it had been 13.6 differential is unremarkable except for a minimally elevated absolute lymphs 4.3 again follow-up with primary care provider  as warranted

## 2017-01-05 ENCOUNTER — Encounter (HOSPITAL_COMMUNITY)
Admission: RE | Admit: 2017-01-05 | Discharge: 2017-01-05 | Disposition: A | Discharge status: Home / Self Care | Payer: Medicare Other | Source: Skilled Nursing Facility | Attending: Internal Medicine | Admitting: Internal Medicine

## 2017-01-05 DIAGNOSIS — Z4789 Encounter for other orthopedic aftercare: Secondary | ICD-10-CM | POA: Insufficient documentation

## 2017-01-05 DIAGNOSIS — Z955 Presence of coronary angioplasty implant and graft: Secondary | ICD-10-CM | POA: Insufficient documentation

## 2017-01-05 DIAGNOSIS — I10 Essential (primary) hypertension: Secondary | ICD-10-CM | POA: Insufficient documentation

## 2017-01-05 DIAGNOSIS — S72141K Displaced intertrochanteric fracture of right femur, subsequent encounter for closed fracture with nonunion: Secondary | ICD-10-CM | POA: Insufficient documentation

## 2017-01-05 LAB — COMPREHENSIVE METABOLIC PANEL
ALK PHOS: 99 U/L (ref 38–126)
ALT: 19 U/L (ref 17–63)
AST: 26 U/L (ref 15–41)
Albumin: 4.1 g/dL (ref 3.5–5.0)
Anion gap: 9 (ref 5–15)
BUN: 23 mg/dL — AB (ref 6–20)
CALCIUM: 9.4 mg/dL (ref 8.9–10.3)
CO2: 29 mmol/L (ref 22–32)
CREATININE: 1.26 mg/dL — AB (ref 0.61–1.24)
Chloride: 99 mmol/L — ABNORMAL LOW (ref 101–111)
GFR calc non Af Amer: 48 mL/min — ABNORMAL LOW (ref >60)
GFR, EST AFRICAN AMERICAN: 55 mL/min — AB (ref >60)
GLUCOSE: 93 mg/dL (ref 65–99)
Potassium: 4 mmol/L (ref 3.5–5.1)
SODIUM: 137 mmol/L (ref 135–145)
Total Bilirubin: 1.2 mg/dL (ref 0.3–1.2)
Total Protein: 8 g/dL (ref 6.5–8.1)

## 2017-01-05 LAB — CBC WITH DIFFERENTIAL/PLATELET
Basophils Absolute: 0.1 10*3/uL (ref 0.0–0.1)
Basophils Relative: 0 %
EOS ABS: 0.4 10*3/uL (ref 0.0–0.7)
Eosinophils Relative: 4 %
HCT: 38.6 % — ABNORMAL LOW (ref 39.0–52.0)
HEMOGLOBIN: 12.3 g/dL — AB (ref 13.0–17.0)
LYMPHS ABS: 4.3 10*3/uL — AB (ref 0.7–4.0)
LYMPHS PCT: 38 %
MCH: 28.8 pg (ref 26.0–34.0)
MCHC: 31.9 g/dL (ref 30.0–36.0)
MCV: 90.4 fL (ref 78.0–100.0)
Monocytes Absolute: 0.6 10*3/uL (ref 0.1–1.0)
Monocytes Relative: 6 %
NEUTROS PCT: 52 %
Neutro Abs: 5.9 10*3/uL (ref 1.7–7.7)
Platelets: 173 10*3/uL (ref 150–400)
RBC: 4.27 MIL/uL (ref 4.22–5.81)
RDW: 13.2 % (ref 11.5–15.5)
WBC: 11.2 10*3/uL — AB (ref 4.0–10.5)

## 2017-01-09 DIAGNOSIS — I10 Essential (primary) hypertension: Secondary | ICD-10-CM | POA: Diagnosis not present

## 2017-01-09 DIAGNOSIS — M6281 Muscle weakness (generalized): Secondary | ICD-10-CM | POA: Diagnosis not present

## 2017-01-09 DIAGNOSIS — I2510 Atherosclerotic heart disease of native coronary artery without angina pectoris: Secondary | ICD-10-CM | POA: Diagnosis not present

## 2017-01-09 DIAGNOSIS — Z9181 History of falling: Secondary | ICD-10-CM | POA: Diagnosis not present

## 2017-01-09 DIAGNOSIS — S72143A Displaced intertrochanteric fracture of unspecified femur, initial encounter for closed fracture: Secondary | ICD-10-CM | POA: Diagnosis not present

## 2017-01-09 DIAGNOSIS — R262 Difficulty in walking, not elsewhere classified: Secondary | ICD-10-CM | POA: Diagnosis not present

## 2017-01-09 DIAGNOSIS — M1990 Unspecified osteoarthritis, unspecified site: Secondary | ICD-10-CM | POA: Diagnosis not present

## 2017-01-09 DIAGNOSIS — N183 Chronic kidney disease, stage 3 (moderate): Secondary | ICD-10-CM | POA: Diagnosis not present

## 2017-01-10 DIAGNOSIS — R262 Difficulty in walking, not elsewhere classified: Secondary | ICD-10-CM | POA: Diagnosis not present

## 2017-01-10 DIAGNOSIS — N183 Chronic kidney disease, stage 3 (moderate): Secondary | ICD-10-CM | POA: Diagnosis not present

## 2017-01-10 DIAGNOSIS — M1990 Unspecified osteoarthritis, unspecified site: Secondary | ICD-10-CM | POA: Diagnosis not present

## 2017-01-10 DIAGNOSIS — I10 Essential (primary) hypertension: Secondary | ICD-10-CM | POA: Diagnosis not present

## 2017-01-10 DIAGNOSIS — S72143A Displaced intertrochanteric fracture of unspecified femur, initial encounter for closed fracture: Secondary | ICD-10-CM | POA: Diagnosis not present

## 2017-01-10 DIAGNOSIS — Z9181 History of falling: Secondary | ICD-10-CM | POA: Diagnosis not present

## 2017-01-10 DIAGNOSIS — M6281 Muscle weakness (generalized): Secondary | ICD-10-CM | POA: Diagnosis not present

## 2017-01-10 DIAGNOSIS — I2510 Atherosclerotic heart disease of native coronary artery without angina pectoris: Secondary | ICD-10-CM | POA: Diagnosis not present

## 2017-01-12 DIAGNOSIS — Z9181 History of falling: Secondary | ICD-10-CM | POA: Diagnosis not present

## 2017-01-12 DIAGNOSIS — I10 Essential (primary) hypertension: Secondary | ICD-10-CM | POA: Diagnosis not present

## 2017-01-12 DIAGNOSIS — S72143A Displaced intertrochanteric fracture of unspecified femur, initial encounter for closed fracture: Secondary | ICD-10-CM | POA: Diagnosis not present

## 2017-01-12 DIAGNOSIS — M6281 Muscle weakness (generalized): Secondary | ICD-10-CM | POA: Diagnosis not present

## 2017-01-12 DIAGNOSIS — N183 Chronic kidney disease, stage 3 (moderate): Secondary | ICD-10-CM | POA: Diagnosis not present

## 2017-01-12 DIAGNOSIS — R262 Difficulty in walking, not elsewhere classified: Secondary | ICD-10-CM | POA: Diagnosis not present

## 2017-01-12 DIAGNOSIS — M1990 Unspecified osteoarthritis, unspecified site: Secondary | ICD-10-CM | POA: Diagnosis not present

## 2017-01-12 DIAGNOSIS — I2510 Atherosclerotic heart disease of native coronary artery without angina pectoris: Secondary | ICD-10-CM | POA: Diagnosis not present

## 2017-01-16 ENCOUNTER — Ambulatory Visit: Payer: Medicare Other | Admitting: Gastroenterology

## 2017-01-16 DIAGNOSIS — H259 Unspecified age-related cataract: Secondary | ICD-10-CM | POA: Diagnosis not present

## 2017-01-16 DIAGNOSIS — I129 Hypertensive chronic kidney disease with stage 1 through stage 4 chronic kidney disease, or unspecified chronic kidney disease: Secondary | ICD-10-CM | POA: Diagnosis not present

## 2017-01-16 DIAGNOSIS — S72141K Displaced intertrochanteric fracture of right femur, subsequent encounter for closed fracture with nonunion: Secondary | ICD-10-CM | POA: Diagnosis not present

## 2017-01-16 DIAGNOSIS — M6281 Muscle weakness (generalized): Secondary | ICD-10-CM | POA: Diagnosis not present

## 2017-01-16 DIAGNOSIS — Z9981 Dependence on supplemental oxygen: Secondary | ICD-10-CM | POA: Diagnosis not present

## 2017-01-16 DIAGNOSIS — Z9861 Coronary angioplasty status: Secondary | ICD-10-CM | POA: Diagnosis not present

## 2017-01-16 DIAGNOSIS — Z9181 History of falling: Secondary | ICD-10-CM | POA: Diagnosis not present

## 2017-01-16 DIAGNOSIS — K269 Duodenal ulcer, unspecified as acute or chronic, without hemorrhage or perforation: Secondary | ICD-10-CM | POA: Diagnosis not present

## 2017-01-16 DIAGNOSIS — M1990 Unspecified osteoarthritis, unspecified site: Secondary | ICD-10-CM | POA: Diagnosis not present

## 2017-01-16 DIAGNOSIS — W19XXXD Unspecified fall, subsequent encounter: Secondary | ICD-10-CM | POA: Diagnosis not present

## 2017-01-16 DIAGNOSIS — Z955 Presence of coronary angioplasty implant and graft: Secondary | ICD-10-CM | POA: Diagnosis not present

## 2017-01-16 DIAGNOSIS — R262 Difficulty in walking, not elsewhere classified: Secondary | ICD-10-CM | POA: Diagnosis not present

## 2017-01-16 DIAGNOSIS — I2510 Atherosclerotic heart disease of native coronary artery without angina pectoris: Secondary | ICD-10-CM | POA: Diagnosis not present

## 2017-01-16 DIAGNOSIS — N183 Chronic kidney disease, stage 3 (moderate): Secondary | ICD-10-CM | POA: Diagnosis not present

## 2017-01-17 DIAGNOSIS — S72141D Displaced intertrochanteric fracture of right femur, subsequent encounter for closed fracture with routine healing: Secondary | ICD-10-CM | POA: Diagnosis not present

## 2017-01-18 ENCOUNTER — Ambulatory Visit (INDEPENDENT_AMBULATORY_CARE_PROVIDER_SITE_OTHER): Payer: Medicare Other | Admitting: Gastroenterology

## 2017-01-18 ENCOUNTER — Encounter: Payer: Self-pay | Admitting: Gastroenterology

## 2017-01-18 DIAGNOSIS — I2510 Atherosclerotic heart disease of native coronary artery without angina pectoris: Secondary | ICD-10-CM | POA: Diagnosis not present

## 2017-01-18 DIAGNOSIS — K279 Peptic ulcer, site unspecified, unspecified as acute or chronic, without hemorrhage or perforation: Secondary | ICD-10-CM | POA: Diagnosis not present

## 2017-01-18 DIAGNOSIS — R1013 Epigastric pain: Secondary | ICD-10-CM

## 2017-01-18 DIAGNOSIS — Z955 Presence of coronary angioplasty implant and graft: Secondary | ICD-10-CM | POA: Diagnosis not present

## 2017-01-18 DIAGNOSIS — Z9861 Coronary angioplasty status: Secondary | ICD-10-CM | POA: Diagnosis not present

## 2017-01-18 DIAGNOSIS — M1990 Unspecified osteoarthritis, unspecified site: Secondary | ICD-10-CM | POA: Diagnosis not present

## 2017-01-18 DIAGNOSIS — H259 Unspecified age-related cataract: Secondary | ICD-10-CM | POA: Diagnosis not present

## 2017-01-18 DIAGNOSIS — K269 Duodenal ulcer, unspecified as acute or chronic, without hemorrhage or perforation: Secondary | ICD-10-CM | POA: Diagnosis not present

## 2017-01-18 DIAGNOSIS — S72141K Displaced intertrochanteric fracture of right femur, subsequent encounter for closed fracture with nonunion: Secondary | ICD-10-CM | POA: Diagnosis not present

## 2017-01-18 DIAGNOSIS — R262 Difficulty in walking, not elsewhere classified: Secondary | ICD-10-CM | POA: Diagnosis not present

## 2017-01-18 DIAGNOSIS — M6281 Muscle weakness (generalized): Secondary | ICD-10-CM | POA: Diagnosis not present

## 2017-01-18 DIAGNOSIS — W19XXXD Unspecified fall, subsequent encounter: Secondary | ICD-10-CM | POA: Diagnosis not present

## 2017-01-18 DIAGNOSIS — Z9181 History of falling: Secondary | ICD-10-CM | POA: Diagnosis not present

## 2017-01-18 DIAGNOSIS — I129 Hypertensive chronic kidney disease with stage 1 through stage 4 chronic kidney disease, or unspecified chronic kidney disease: Secondary | ICD-10-CM | POA: Diagnosis not present

## 2017-01-18 DIAGNOSIS — Z9981 Dependence on supplemental oxygen: Secondary | ICD-10-CM | POA: Diagnosis not present

## 2017-01-18 DIAGNOSIS — N183 Chronic kidney disease, stage 3 (moderate): Secondary | ICD-10-CM | POA: Diagnosis not present

## 2017-01-18 NOTE — Assessment & Plan Note (Signed)
LAST EGD JUL 2018.   USE TYLENOL. AVOID ASA/NSAIDs.

## 2017-01-18 NOTE — Assessment & Plan Note (Signed)
ONSET IN PAST 2 DAYS.  DIFFERENTIAL DIAGNOSIS INCLUDES: VIRAL ILLNESS OR UTI, LESS LIKELY UGIB.  SUBMIT URINE SAMPLE. DRINK WATER TO KEEP YOUR URINE LIGHT YELLOW. CALL WITH QUESTIONS OR CONCERNS.  FOLLOW UP IN 6 MOS.

## 2017-01-18 NOTE — Progress Notes (Signed)
Subjective:    Patient ID: Anthony Galvan, male    DOB: November 01, 1921, 83 y.o.   MRN: [id number]  Anthony Kettle, MD  HPI Pt been taking Advil as needed. STOMACH UPSET AND MAKES HIM FEEL BAD. HAVING SOFT STOOLS. IF COMES ON AND HE FELT REAL BAD YESTERDAY AND TODAY. BOWEL SMOVING EVERY AM 6 OR 630 PM. USUALLY ONCE A DAY. NOW HAVING 3 A DAY. FEELS BETTER. WAS NAUSEATED BUT NO VOMITING. NO ABD PAIN REALLY JUST FELT SICK AND WEAK.  BEEN IN HOSPITAL 2 TIMES SINCE LAST VISIT. Symptoms may come in waves.  PT DENIES FEVER, CHILLS, HEMATOCHEZIA, vomiting, melena, diarrhea, CHEST PAIN, SHORTNESS OF BREATH, CHANGE IN BOWEL IN HABITS, constipation, problems swallowing, OR heartburn or indigestion.  V.2 C/O STOMACH DISTENDED AND GASSY AND BLOATED,LOOSE STOOLS. UP ALL NIGHT URINATING. USING OXYGEN PRN. MIGHT BE A UTI. Not measuring temp at home.   Past Medical History:  Diagnosis Date  . CAD S/P percutaneous coronary angioplasty 08/01/2012   PCI of mid LAD with a VeriFlex Bare Metal Stent 3.0 mm x 12 mm - post-dilated to 3.5 mm.  . Duodenal ulcer May 2011   on EGD  . GERD (gastroesophageal reflux disease)   . Helicobacter pylori gastritis MAY 2012 HP STOOL AG NEG  . HTN (hypertension)   . Hyperlipidemia   . MI (myocardial infarction) ([place]) 1976, 1980, [place] for at [facility] and [facility] (Dr. Iona Beard)  . Osteoarthritis   . S/P colonoscopy 2011   normal, internal hemorrhoids    Past Surgical History:  Procedure Laterality Date  . CARDIAC CATHETERIZATION  01/30/2007   D1 75 %, mid LAD 50%, 50-70% circumflex, 50-70% RCA.(Dr. Adora Fridge)  . CARDIAC CATHETERIZATION  12/11/2001   normal L main, small RCA, dominant LL Cfx with mild diffuse disease, LAD with mid 10-20% stenosis, ramus intermedius with mild diffuse disease (Dr. Jackie Plum)  . CATARACT EXTRACTION    . COLONOSCOPY  OCT 2011 ARS   S[facility] [place]   in setting of MI  . ESOPHAGOGASTRODUODENOSCOPY  N/A 10/09/2016   Procedure: ESOPHAGOGASTRODUODENOSCOPY (EGD);  Surgeon: Danie Binder, MD;  [facility];  Service: Endoscopy;  Laterality: N/A;  . FEMUR IM NAIL Right 12/01/2016   Procedure: INTRAMEDULLARY (IM) NAIL RIGHT HIP;  Surgeon: Altamese [place], MD;  Location: [facility];  Service: Orthopedics;  Laterality: Right;  . LEFT HEART CATH AND CORONARY ANGIOGRAPHY N/A 10/23/2016   Procedure: Left Heart Cath and Coronary Angiography;  Surgeon: Sherren Mocha, MD;  Location: [facility] CV LAB;  Service: Cardiovascular;  Laterality: N/A;  . LEFT HEART CATHETERIZATION WITH CORONARY ANGIOGRAM N/A 08/01/2012   Procedure: LEFT HEART CATHETERIZATION WITH CORONARY ANGIOGRAM;  Surgeon: Leonie Man, MD;  Location[facility] CATH LAB: Mid LAD 99% apple core; D2 ostial 70-80% (2 small for PCI) small nondominant RCA 60-70%. Distal circumflex/L PDA ~50% --> PCI of LAD  . NM MYOCAR PERF WALL MOTION  08/2003   adenosine stress - focal decreased perfusion defect in distal inferior wall, no significant ischemic changes  . PERCUTANEOUS STENT INTERVENTION  08/01/2012   Procedure: PERCUTANEOUS STENT INTERVENTION;  Surgeon: Leonie Man, MD;  Location: [facility] CATH LAB; ;PCI of mid LAD with a VeriFlex Bare Metal Stent 3.0 mm x 12 mm - post-dilated to 3.5 mm.  . TRANSTHORACIC ECHOCARDIOGRAM  April 2014   normal LV size and function. EF 60-65%. Grade 1 diastolic soft. Mild aortic valve  calcification  . UPPER GASTROINTESTINAL ENDOSCOPY  MAY 2011 MELENA, HEMATEMESIS   DUODENAL ULCER, Bx: H PYLORI POS    No Known Allergies  Current Outpatient Prescriptions  Medication Sig Dispense Refill  . acetaminophen (TYLENOL) 325 MG tablet Take 2 tablets (650 mg total) by mouth 3 (three) times daily as needed (pain).    Marland Kitchen amLODipine (NORVASC) 5 MG tablet Take 1 tablet (5 mg total) by mouth daily.    Marland Kitchen atorvastatin (LIPITOR) 80 MG tablet Take 1 tablet (80 mg total) by mouth daily at 6 PM.    . cetirizine (ZYRTEC) 5 MG tablet Take 10  mg by mouth daily.     . cholecalciferol 5000 units TABS Take 1 tablet (5,000 Units total) by mouth daily.    Marland Kitchen donepezil (ARICEPT) 10 MG tablet Take 5 mg by mouth daily.    Marland Kitchen HYDROcodone-acetaminophen (NORCO/VICODIN) 5-325 MG tablet Take 1 tablet by mouth every 6 hours as needed for moderate pain. Take  2 tablets by mouth every 6 hours as needed for severe pain.    . isosorbide mononitrate (IMDUR) 30 MG 24 hr tablet Take 2 tablets (60 mg total) by mouth daily.    . Menthol, Topical Analgesic, (BIOFREEZE) 4 % GEL Apply to BLE/PT clarification once a day    . metoprolol (LOPRESSOR) 50 MG tablet Take 50 mg by mouth 2 (two) times daily.    . Multiple Vitamin (MULTIVITAMIN WITH MINERALS) TABS tablet Take 1 tablet by mouth daily.    Marland Kitchen NITROSTAT 0.4 MG SL tablet PLACE 1 TABLET UNDER TONGUE EVERY 5 MINUTES FOR 3 DOSES AS NEEDED.    Marland Kitchen pantoprazole (PROTONIX) 40 MG tablet Take 1 tablet (40 mg total) by mouth 2 (two) times daily before a meal.         Review of Systems PER HPI OTHERWISE ALL SYSTEMS ARE NEGATIVE.    Objective:   Physical Exam  Constitutional: He is oriented to person, place, and time. He appears well-developed and well-nourished. No distress.  HENT:  Head: Normocephalic and atraumatic.  Mouth/Throat: Oropharynx is clear and moist. No oropharyngeal exudate.  Eyes: Pupils are equal, round, and reactive to light. No scleral icterus.  Neck: Normal range of motion. Neck supple.  Cardiovascular: Normal rate, regular rhythm and normal heart sounds.   Pulmonary/Chest: Effort normal and breath sounds normal. No respiratory distress.  Abdominal: Soft. Bowel sounds are normal. He exhibits no distension. There is no tenderness.  Musculoskeletal: He exhibits edema (TRACE  BIL LEs).  WALKS ASSISTED WITH A WALKER  Lymphadenopathy:    He has no cervical adenopathy.  Neurological: He is alert and oriented to person, place, and time.  NO  NEW FOCAL DEFICITS  Psychiatric: He has a normal mood  and affect.  Vitals reviewed.         Assessment & Plan:

## 2017-01-18 NOTE — Patient Instructions (Addendum)
SUBMIT URINE SAMPLE TODAY.  DRINK WATER TO KEEP YOUR URINE LIGHT YELLOW.  PLEASE CALL WITH QUESTIONS OR CONCERNS.  FOLLOW UP IN 6 MOS. MERRY CHRISTMAS AND HAPPY NEW YEAR!

## 2017-01-19 DIAGNOSIS — N183 Chronic kidney disease, stage 3 (moderate): Secondary | ICD-10-CM | POA: Diagnosis not present

## 2017-01-19 DIAGNOSIS — M1990 Unspecified osteoarthritis, unspecified site: Secondary | ICD-10-CM | POA: Diagnosis not present

## 2017-01-19 DIAGNOSIS — K269 Duodenal ulcer, unspecified as acute or chronic, without hemorrhage or perforation: Secondary | ICD-10-CM | POA: Diagnosis not present

## 2017-01-19 DIAGNOSIS — I2510 Atherosclerotic heart disease of native coronary artery without angina pectoris: Secondary | ICD-10-CM | POA: Diagnosis not present

## 2017-01-19 DIAGNOSIS — S72141K Displaced intertrochanteric fracture of right femur, subsequent encounter for closed fracture with nonunion: Secondary | ICD-10-CM | POA: Diagnosis not present

## 2017-01-19 DIAGNOSIS — I129 Hypertensive chronic kidney disease with stage 1 through stage 4 chronic kidney disease, or unspecified chronic kidney disease: Secondary | ICD-10-CM | POA: Diagnosis not present

## 2017-01-19 DIAGNOSIS — R262 Difficulty in walking, not elsewhere classified: Secondary | ICD-10-CM | POA: Diagnosis not present

## 2017-01-19 DIAGNOSIS — M6281 Muscle weakness (generalized): Secondary | ICD-10-CM | POA: Diagnosis not present

## 2017-01-19 DIAGNOSIS — H259 Unspecified age-related cataract: Secondary | ICD-10-CM | POA: Diagnosis not present

## 2017-01-19 DIAGNOSIS — Z9981 Dependence on supplemental oxygen: Secondary | ICD-10-CM | POA: Diagnosis not present

## 2017-01-19 DIAGNOSIS — Z9181 History of falling: Secondary | ICD-10-CM | POA: Diagnosis not present

## 2017-01-19 DIAGNOSIS — Z9861 Coronary angioplasty status: Secondary | ICD-10-CM | POA: Diagnosis not present

## 2017-01-19 DIAGNOSIS — Z955 Presence of coronary angioplasty implant and graft: Secondary | ICD-10-CM | POA: Diagnosis not present

## 2017-01-19 DIAGNOSIS — W19XXXD Unspecified fall, subsequent encounter: Secondary | ICD-10-CM | POA: Diagnosis not present

## 2017-01-19 LAB — MICROSCOPIC EXAMINATION: Bacteria, UA: NONE SEEN

## 2017-01-19 LAB — URINALYSIS, ROUTINE W REFLEX MICROSCOPIC
BILIRUBIN UA: NEGATIVE
GLUCOSE, UA: NEGATIVE
KETONES UA: NEGATIVE
Leukocytes, UA: NEGATIVE
NITRITE UA: NEGATIVE
RBC UA: NEGATIVE
Specific Gravity, UA: 1.018 (ref 1.005–1.030)
UUROB: 0.2 mg/dL (ref 0.2–1.0)
pH, UA: 6.5 (ref 5.0–7.5)

## 2017-01-22 DIAGNOSIS — Z9181 History of falling: Secondary | ICD-10-CM | POA: Diagnosis not present

## 2017-01-22 DIAGNOSIS — I2510 Atherosclerotic heart disease of native coronary artery without angina pectoris: Secondary | ICD-10-CM | POA: Diagnosis not present

## 2017-01-22 DIAGNOSIS — S72141K Displaced intertrochanteric fracture of right femur, subsequent encounter for closed fracture with nonunion: Secondary | ICD-10-CM | POA: Diagnosis not present

## 2017-01-22 DIAGNOSIS — K269 Duodenal ulcer, unspecified as acute or chronic, without hemorrhage or perforation: Secondary | ICD-10-CM | POA: Diagnosis not present

## 2017-01-22 DIAGNOSIS — N183 Chronic kidney disease, stage 3 (moderate): Secondary | ICD-10-CM | POA: Diagnosis not present

## 2017-01-22 DIAGNOSIS — M6281 Muscle weakness (generalized): Secondary | ICD-10-CM | POA: Diagnosis not present

## 2017-01-22 DIAGNOSIS — M1990 Unspecified osteoarthritis, unspecified site: Secondary | ICD-10-CM | POA: Diagnosis not present

## 2017-01-22 DIAGNOSIS — Z9981 Dependence on supplemental oxygen: Secondary | ICD-10-CM | POA: Diagnosis not present

## 2017-01-22 DIAGNOSIS — H259 Unspecified age-related cataract: Secondary | ICD-10-CM | POA: Diagnosis not present

## 2017-01-22 DIAGNOSIS — W19XXXD Unspecified fall, subsequent encounter: Secondary | ICD-10-CM | POA: Diagnosis not present

## 2017-01-22 DIAGNOSIS — Z955 Presence of coronary angioplasty implant and graft: Secondary | ICD-10-CM | POA: Diagnosis not present

## 2017-01-22 DIAGNOSIS — Z9861 Coronary angioplasty status: Secondary | ICD-10-CM | POA: Diagnosis not present

## 2017-01-22 DIAGNOSIS — I129 Hypertensive chronic kidney disease with stage 1 through stage 4 chronic kidney disease, or unspecified chronic kidney disease: Secondary | ICD-10-CM | POA: Diagnosis not present

## 2017-01-22 DIAGNOSIS — R262 Difficulty in walking, not elsewhere classified: Secondary | ICD-10-CM | POA: Diagnosis not present

## 2017-01-23 ENCOUNTER — Telehealth: Payer: Self-pay | Admitting: Gastroenterology

## 2017-01-23 DIAGNOSIS — Z955 Presence of coronary angioplasty implant and graft: Secondary | ICD-10-CM | POA: Diagnosis not present

## 2017-01-23 DIAGNOSIS — I129 Hypertensive chronic kidney disease with stage 1 through stage 4 chronic kidney disease, or unspecified chronic kidney disease: Secondary | ICD-10-CM | POA: Diagnosis not present

## 2017-01-23 DIAGNOSIS — Z9981 Dependence on supplemental oxygen: Secondary | ICD-10-CM | POA: Diagnosis not present

## 2017-01-23 DIAGNOSIS — H259 Unspecified age-related cataract: Secondary | ICD-10-CM | POA: Diagnosis not present

## 2017-01-23 DIAGNOSIS — M6281 Muscle weakness (generalized): Secondary | ICD-10-CM | POA: Diagnosis not present

## 2017-01-23 DIAGNOSIS — S72141K Displaced intertrochanteric fracture of right femur, subsequent encounter for closed fracture with nonunion: Secondary | ICD-10-CM | POA: Diagnosis not present

## 2017-01-23 DIAGNOSIS — M1990 Unspecified osteoarthritis, unspecified site: Secondary | ICD-10-CM | POA: Diagnosis not present

## 2017-01-23 DIAGNOSIS — R262 Difficulty in walking, not elsewhere classified: Secondary | ICD-10-CM | POA: Diagnosis not present

## 2017-01-23 DIAGNOSIS — W19XXXD Unspecified fall, subsequent encounter: Secondary | ICD-10-CM | POA: Diagnosis not present

## 2017-01-23 DIAGNOSIS — Z9181 History of falling: Secondary | ICD-10-CM | POA: Diagnosis not present

## 2017-01-23 DIAGNOSIS — Z9861 Coronary angioplasty status: Secondary | ICD-10-CM | POA: Diagnosis not present

## 2017-01-23 DIAGNOSIS — K269 Duodenal ulcer, unspecified as acute or chronic, without hemorrhage or perforation: Secondary | ICD-10-CM | POA: Diagnosis not present

## 2017-01-23 DIAGNOSIS — I2510 Atherosclerotic heart disease of native coronary artery without angina pectoris: Secondary | ICD-10-CM | POA: Diagnosis not present

## 2017-01-23 DIAGNOSIS — N183 Chronic kidney disease, stage 3 (moderate): Secondary | ICD-10-CM | POA: Diagnosis not present

## 2017-01-23 NOTE — Telephone Encounter (Signed)
Forwarding to Dr.Fields.  

## 2017-01-23 NOTE — Telephone Encounter (Signed)
Pt's daughter, Anthony Galvan, was calling to check on Anthony Galvan's lab results that he had done last Thursday. Please call [phone]

## 2017-01-24 NOTE — Telephone Encounter (Signed)
REVIEWED-NO ADDITIONAL RECOMMENDATIONS. 

## 2017-01-24 NOTE — Telephone Encounter (Signed)
PLEASE CALL PT'S DAUGHTER. HIS URINE WAS NEGATIVE FOR INFECTION. HE DOES HAVE PROTEIN AND A FEW RED BLOOD CELLS IN HIS URINE. HE CAN SEE HIS PRIMARY DOCTOR FOR FOLLOW UP OF PROTEIN AND RED BLOOD CELLS IN THE URINE OR HE CAN SEE A KIDNEY SPECIALIST. I CAN REFER HIM TO THE KIDNEY DOCTOR IF THEY WOULD LIKE.

## 2017-01-24 NOTE — Telephone Encounter (Signed)
Spoke to Santiago Glad, daughter and the pt and informed both of them. Pt wants to discuss with PCP ( at the [facility]) first. If he needs the referral to kidney doctor they will let us know.

## 2017-01-25 DIAGNOSIS — M1990 Unspecified osteoarthritis, unspecified site: Secondary | ICD-10-CM | POA: Diagnosis not present

## 2017-01-25 DIAGNOSIS — I129 Hypertensive chronic kidney disease with stage 1 through stage 4 chronic kidney disease, or unspecified chronic kidney disease: Secondary | ICD-10-CM | POA: Diagnosis not present

## 2017-01-25 DIAGNOSIS — Z955 Presence of coronary angioplasty implant and graft: Secondary | ICD-10-CM | POA: Diagnosis not present

## 2017-01-25 DIAGNOSIS — M6281 Muscle weakness (generalized): Secondary | ICD-10-CM | POA: Diagnosis not present

## 2017-01-25 DIAGNOSIS — R262 Difficulty in walking, not elsewhere classified: Secondary | ICD-10-CM | POA: Diagnosis not present

## 2017-01-25 DIAGNOSIS — N183 Chronic kidney disease, stage 3 (moderate): Secondary | ICD-10-CM | POA: Diagnosis not present

## 2017-01-25 DIAGNOSIS — I2510 Atherosclerotic heart disease of native coronary artery without angina pectoris: Secondary | ICD-10-CM | POA: Diagnosis not present

## 2017-01-25 DIAGNOSIS — K269 Duodenal ulcer, unspecified as acute or chronic, without hemorrhage or perforation: Secondary | ICD-10-CM | POA: Diagnosis not present

## 2017-01-25 DIAGNOSIS — Z9981 Dependence on supplemental oxygen: Secondary | ICD-10-CM | POA: Diagnosis not present

## 2017-01-25 DIAGNOSIS — Z9861 Coronary angioplasty status: Secondary | ICD-10-CM | POA: Diagnosis not present

## 2017-01-25 DIAGNOSIS — H259 Unspecified age-related cataract: Secondary | ICD-10-CM | POA: Diagnosis not present

## 2017-01-25 DIAGNOSIS — Z9181 History of falling: Secondary | ICD-10-CM | POA: Diagnosis not present

## 2017-01-25 DIAGNOSIS — W19XXXD Unspecified fall, subsequent encounter: Secondary | ICD-10-CM | POA: Diagnosis not present

## 2017-01-25 DIAGNOSIS — S72141K Displaced intertrochanteric fracture of right femur, subsequent encounter for closed fracture with nonunion: Secondary | ICD-10-CM | POA: Diagnosis not present

## 2017-01-30 DIAGNOSIS — Z9861 Coronary angioplasty status: Secondary | ICD-10-CM | POA: Diagnosis not present

## 2017-01-30 DIAGNOSIS — I2510 Atherosclerotic heart disease of native coronary artery without angina pectoris: Secondary | ICD-10-CM | POA: Diagnosis not present

## 2017-01-30 DIAGNOSIS — S72141K Displaced intertrochanteric fracture of right femur, subsequent encounter for closed fracture with nonunion: Secondary | ICD-10-CM | POA: Diagnosis not present

## 2017-01-30 DIAGNOSIS — M6281 Muscle weakness (generalized): Secondary | ICD-10-CM | POA: Diagnosis not present

## 2017-01-30 DIAGNOSIS — K269 Duodenal ulcer, unspecified as acute or chronic, without hemorrhage or perforation: Secondary | ICD-10-CM | POA: Diagnosis not present

## 2017-01-30 DIAGNOSIS — M1990 Unspecified osteoarthritis, unspecified site: Secondary | ICD-10-CM | POA: Diagnosis not present

## 2017-01-30 DIAGNOSIS — H259 Unspecified age-related cataract: Secondary | ICD-10-CM | POA: Diagnosis not present

## 2017-01-30 DIAGNOSIS — R262 Difficulty in walking, not elsewhere classified: Secondary | ICD-10-CM | POA: Diagnosis not present

## 2017-01-30 DIAGNOSIS — Z9181 History of falling: Secondary | ICD-10-CM | POA: Diagnosis not present

## 2017-01-30 DIAGNOSIS — N183 Chronic kidney disease, stage 3 (moderate): Secondary | ICD-10-CM | POA: Diagnosis not present

## 2017-01-30 DIAGNOSIS — Z955 Presence of coronary angioplasty implant and graft: Secondary | ICD-10-CM | POA: Diagnosis not present

## 2017-01-30 DIAGNOSIS — I129 Hypertensive chronic kidney disease with stage 1 through stage 4 chronic kidney disease, or unspecified chronic kidney disease: Secondary | ICD-10-CM | POA: Diagnosis not present

## 2017-01-30 DIAGNOSIS — Z9981 Dependence on supplemental oxygen: Secondary | ICD-10-CM | POA: Diagnosis not present

## 2017-01-30 DIAGNOSIS — W19XXXD Unspecified fall, subsequent encounter: Secondary | ICD-10-CM | POA: Diagnosis not present

## 2017-02-01 DIAGNOSIS — H259 Unspecified age-related cataract: Secondary | ICD-10-CM | POA: Diagnosis not present

## 2017-02-01 DIAGNOSIS — Z9861 Coronary angioplasty status: Secondary | ICD-10-CM | POA: Diagnosis not present

## 2017-02-01 DIAGNOSIS — Z955 Presence of coronary angioplasty implant and graft: Secondary | ICD-10-CM | POA: Diagnosis not present

## 2017-02-01 DIAGNOSIS — Z9181 History of falling: Secondary | ICD-10-CM | POA: Diagnosis not present

## 2017-02-01 DIAGNOSIS — I129 Hypertensive chronic kidney disease with stage 1 through stage 4 chronic kidney disease, or unspecified chronic kidney disease: Secondary | ICD-10-CM | POA: Diagnosis not present

## 2017-02-01 DIAGNOSIS — S72141K Displaced intertrochanteric fracture of right femur, subsequent encounter for closed fracture with nonunion: Secondary | ICD-10-CM | POA: Diagnosis not present

## 2017-02-01 DIAGNOSIS — Z9981 Dependence on supplemental oxygen: Secondary | ICD-10-CM | POA: Diagnosis not present

## 2017-02-01 DIAGNOSIS — M1990 Unspecified osteoarthritis, unspecified site: Secondary | ICD-10-CM | POA: Diagnosis not present

## 2017-02-01 DIAGNOSIS — N183 Chronic kidney disease, stage 3 (moderate): Secondary | ICD-10-CM | POA: Diagnosis not present

## 2017-02-01 DIAGNOSIS — M6281 Muscle weakness (generalized): Secondary | ICD-10-CM | POA: Diagnosis not present

## 2017-02-01 DIAGNOSIS — R262 Difficulty in walking, not elsewhere classified: Secondary | ICD-10-CM | POA: Diagnosis not present

## 2017-02-01 DIAGNOSIS — W19XXXD Unspecified fall, subsequent encounter: Secondary | ICD-10-CM | POA: Diagnosis not present

## 2017-02-01 DIAGNOSIS — I2510 Atherosclerotic heart disease of native coronary artery without angina pectoris: Secondary | ICD-10-CM | POA: Diagnosis not present

## 2017-02-01 DIAGNOSIS — K269 Duodenal ulcer, unspecified as acute or chronic, without hemorrhage or perforation: Secondary | ICD-10-CM | POA: Diagnosis not present

## 2017-02-06 DIAGNOSIS — N183 Chronic kidney disease, stage 3 (moderate): Secondary | ICD-10-CM | POA: Diagnosis not present

## 2017-02-06 DIAGNOSIS — Z9981 Dependence on supplemental oxygen: Secondary | ICD-10-CM | POA: Diagnosis not present

## 2017-02-06 DIAGNOSIS — H259 Unspecified age-related cataract: Secondary | ICD-10-CM | POA: Diagnosis not present

## 2017-02-06 DIAGNOSIS — Z955 Presence of coronary angioplasty implant and graft: Secondary | ICD-10-CM | POA: Diagnosis not present

## 2017-02-06 DIAGNOSIS — I129 Hypertensive chronic kidney disease with stage 1 through stage 4 chronic kidney disease, or unspecified chronic kidney disease: Secondary | ICD-10-CM | POA: Diagnosis not present

## 2017-02-06 DIAGNOSIS — I2510 Atherosclerotic heart disease of native coronary artery without angina pectoris: Secondary | ICD-10-CM | POA: Diagnosis not present

## 2017-02-06 DIAGNOSIS — K269 Duodenal ulcer, unspecified as acute or chronic, without hemorrhage or perforation: Secondary | ICD-10-CM | POA: Diagnosis not present

## 2017-02-06 DIAGNOSIS — Z9181 History of falling: Secondary | ICD-10-CM | POA: Diagnosis not present

## 2017-02-06 DIAGNOSIS — Z9861 Coronary angioplasty status: Secondary | ICD-10-CM | POA: Diagnosis not present

## 2017-02-06 DIAGNOSIS — M1990 Unspecified osteoarthritis, unspecified site: Secondary | ICD-10-CM | POA: Diagnosis not present

## 2017-02-06 DIAGNOSIS — S72141K Displaced intertrochanteric fracture of right femur, subsequent encounter for closed fracture with nonunion: Secondary | ICD-10-CM | POA: Diagnosis not present

## 2017-02-06 DIAGNOSIS — R262 Difficulty in walking, not elsewhere classified: Secondary | ICD-10-CM | POA: Diagnosis not present

## 2017-02-06 DIAGNOSIS — W19XXXD Unspecified fall, subsequent encounter: Secondary | ICD-10-CM | POA: Diagnosis not present

## 2017-02-06 DIAGNOSIS — M6281 Muscle weakness (generalized): Secondary | ICD-10-CM | POA: Diagnosis not present

## 2017-02-08 DIAGNOSIS — I2510 Atherosclerotic heart disease of native coronary artery without angina pectoris: Secondary | ICD-10-CM | POA: Diagnosis not present

## 2017-02-08 DIAGNOSIS — M1990 Unspecified osteoarthritis, unspecified site: Secondary | ICD-10-CM | POA: Diagnosis not present

## 2017-02-08 DIAGNOSIS — W19XXXD Unspecified fall, subsequent encounter: Secondary | ICD-10-CM | POA: Diagnosis not present

## 2017-02-08 DIAGNOSIS — N183 Chronic kidney disease, stage 3 (moderate): Secondary | ICD-10-CM | POA: Diagnosis not present

## 2017-02-08 DIAGNOSIS — K269 Duodenal ulcer, unspecified as acute or chronic, without hemorrhage or perforation: Secondary | ICD-10-CM | POA: Diagnosis not present

## 2017-02-08 DIAGNOSIS — Z9181 History of falling: Secondary | ICD-10-CM | POA: Diagnosis not present

## 2017-02-08 DIAGNOSIS — I129 Hypertensive chronic kidney disease with stage 1 through stage 4 chronic kidney disease, or unspecified chronic kidney disease: Secondary | ICD-10-CM | POA: Diagnosis not present

## 2017-02-08 DIAGNOSIS — Z9981 Dependence on supplemental oxygen: Secondary | ICD-10-CM | POA: Diagnosis not present

## 2017-02-08 DIAGNOSIS — R262 Difficulty in walking, not elsewhere classified: Secondary | ICD-10-CM | POA: Diagnosis not present

## 2017-02-08 DIAGNOSIS — H259 Unspecified age-related cataract: Secondary | ICD-10-CM | POA: Diagnosis not present

## 2017-02-08 DIAGNOSIS — Z955 Presence of coronary angioplasty implant and graft: Secondary | ICD-10-CM | POA: Diagnosis not present

## 2017-02-08 DIAGNOSIS — Z9861 Coronary angioplasty status: Secondary | ICD-10-CM | POA: Diagnosis not present

## 2017-02-08 DIAGNOSIS — S72141K Displaced intertrochanteric fracture of right femur, subsequent encounter for closed fracture with nonunion: Secondary | ICD-10-CM | POA: Diagnosis not present

## 2017-02-08 DIAGNOSIS — M6281 Muscle weakness (generalized): Secondary | ICD-10-CM | POA: Diagnosis not present

## 2017-02-16 ENCOUNTER — Ambulatory Visit: Payer: Medicare Other | Admitting: Cardiology

## 2017-02-22 ENCOUNTER — Ambulatory Visit: Payer: Medicare Other | Admitting: Cardiology

## 2017-02-22 ENCOUNTER — Encounter: Payer: Self-pay | Admitting: Cardiology

## 2017-02-22 VITALS — BP 124/52 | HR 80 | Ht 64.0 in | Wt 164.0 lb

## 2017-02-22 DIAGNOSIS — I2510 Atherosclerotic heart disease of native coronary artery without angina pectoris: Secondary | ICD-10-CM

## 2017-02-22 DIAGNOSIS — K279 Peptic ulcer, site unspecified, unspecified as acute or chronic, without hemorrhage or perforation: Secondary | ICD-10-CM

## 2017-02-22 DIAGNOSIS — E785 Hyperlipidemia, unspecified: Secondary | ICD-10-CM | POA: Diagnosis not present

## 2017-02-22 DIAGNOSIS — I10 Essential (primary) hypertension: Secondary | ICD-10-CM

## 2017-02-22 DIAGNOSIS — Z9861 Coronary angioplasty status: Secondary | ICD-10-CM

## 2017-02-22 NOTE — Patient Instructions (Signed)
No changes with medications     Your physician wants you to follow-up in 12 months with DR HARDING. You will receive a reminder letter in the mail two months in advance. If you don't receive a letter, please call our office to schedule the follow-up appointment.  If you need a refill on your cardiac medications before your next appointment, please call your pharmacy.

## 2017-02-22 NOTE — Progress Notes (Signed)
PCP: [name], MD  Clinic Note: Chief Complaint  Patient presents with  . Follow-up  . Coronary Artery Disease     HPI: Anthony Galvan is a 83 y.o. male with a PMH below who presents today for 3 month f/u for CAD. I personally have not seen him since 2015. PMH of HTN, HLD, OA, GERD, H Pylori gastritis, and CAD with BMS to LAD in 2014 - taken off Plavix 09/2016 (for PUD & erosive gastritis).   Echocardiogram done at [facility] on 03/24/2016 which showed EF 55%, moderate LVH, normal right ventricular systolic function, mild AI, mild TR.  [facility] admission 10/20/2016 - ? Unstable angina.; Cath w/ patent stents, severe-heavily calcified dLCx-PDA lesion -> Med Rx.   Imur increased to 60 mg. JONATHON TAN was last seen on 11/08/16 by Almyra Deforest, PA - was doing well.  Just that BP was up a bit.    Recent Hospitalizations:   June - GI Bleed - stopped Plavix.  July - CP - Cath, Echo  Aub 16, 2018 - Intertrochanteric Fxr - R femur; intramedullary nailing   Studies Personally Reviewed - (if available, images/films reviewed: From Epic Chart or Care Everywhere)  Cath 10/2016:  1. Two-vessel coronary artery disease (left dominant) with patency of the stented segment in the proximal LAD and severe heavily calcified distal circumflex/left PDA stenosis unfavorable for PCI  2. Known normal LV function by noninvasive assessment  3. Normal LVEDP  Recommend: Medical therapy for CAD. The distal circumflex/PDA lesion is severely calcified with a distal vessel location that is unfavorable for PCI and I would be very hesitant to use atherectomy in this distal location.    2D Echo: mild LVH - 60-65%. Gr 1 DD. No RWMA. Mild MR. Mod TR. Mild RA dilation.  PAP ~ 60 mmHg.   Interval History: Jerad finally returns for follow-up after recovering from his hip surgery.  He is finally back to doing his walking exercises, but still somewhat slow.  With the exception of one short episode yesterday when he had a little  bit of tightness in his chest early in the morning, he has not had any further episodes of chest tightness or pressure with rest or exertion, but is really yet to fully exert himself.  He is now walking with a walker, and hopes to avoid any future falls. He has not had in any cause take any nitroglycerin.    No resting or exertional dyspnea. No heart failure symptoms of PND, orthopnea or edema. He has not had any rapid irregular heartbeats or palpitations.  No syncope/near syncope, or TIA/amaurosis fugax symptoms. No further episodes of melena, hematochezia, hematuria, or epstaxis. No claudication.  ROS: A comprehensive was performed. Review of Systems  Constitutional: Negative for malaise/fatigue.  HENT: Negative for congestion and nosebleeds.   Respiratory: Negative for cough and shortness of breath.   Cardiovascular: Negative for claudication.  Gastrointestinal: Negative for abdominal pain and diarrhea.  Genitourinary: Negative for dysuria.  Musculoskeletal: Positive for joint pain.       Slowly recovering from hip surgery.  Skin: Negative.   Neurological: Negative for dizziness and headaches.  Psychiatric/Behavioral: Negative for depression and memory loss. The patient is not nervous/anxious and does not have insomnia.   All other systems reviewed and are negative.  I have reviewed and (if needed) personally updated the patient's problem list, medications, allergies, past medical and surgical history, social and family history.   Past Medical History:  Diagnosis Date  .  CAD S/P percutaneous coronary angioplasty 08/01/2012   PCI of mid LAD with a VeriFlex Bare Metal Stent 3.0 mm x 12 mm - post-dilated to 3.5 mm.;  Catheterization in July 2018 showed with severe heavily calcified distal circumflex-left PDA stenosis.  Not favorable for PCI.  . Duodenal ulcer May 2011   on EGD  . GERD (gastroesophageal reflux disease)   . Helicobacter pylori gastritis MAY 2012 HP STOOL AG NEG  . HTN  (hypertension)   . Hyperlipidemia   . MI (myocardial infarction) ([place]) 1976, 1980, [place] for at [facility] and [place] (Dr. Iona Beard)  . Osteoarthritis   . S/P colonoscopy 2011   normal, internal hemorrhoids    Past Surgical History:  Procedure Laterality Date  . CARDIAC CATHETERIZATION  01/30/2007   D1 75 %, mid LAD 50%, 50-70% circumflex, 50-70% RCA.(Dr. Adora Fridge)  . CARDIAC CATHETERIZATION  12/11/2001   normal L main, small RCA, dominant LL Cfx with mild diffuse disease, LAD with mid 10-20% stenosis, ramus intermedius with mild diffuse disease (Dr. Jackie Plum)  . CATARACT EXTRACTION    . COLONOSCOPY  OCT 2011 ARS   SML [place]   in setting of MI  . NM MYOCAR PERF WALL MOTION  08/2003   adenosine stress - focal decreased perfusion defect in distal inferior wall, no significant ischemic changes  . TRANSTHORACIC ECHOCARDIOGRAM  10/2016   mild LVH - 60-65%. Gr 1 DD. No RWMA. Mild MR. Mod TR. Mild RA dilation.  PAP ~ 60 mmHg.  Marland Kitchen UPPER GASTROINTESTINAL ENDOSCOPY  08/2009   MELENA, HEMATEMESIS --> DUODENAL ULCER, Bx: H PYLORI POS    Current Meds  Medication Sig  . acetaminophen (TYLENOL) 325 MG tablet Take 2 tablets (650 mg total) by mouth 3 (three) times daily as needed (pain).  Marland Kitchen atorvastatin (LIPITOR) 80 MG tablet Take 1 tablet (80 mg total) by mouth daily at 6 PM.  . cetirizine (ZYRTEC) 5 MG tablet Take 10 mg by mouth daily.   . clopidogrel (PLAVIX) 75 MG tablet Take 75 mg daily by mouth.  . donepezil (ARICEPT) 10 MG tablet Take 5 mg by mouth daily.  . hydrALAZINE (APRESOLINE) 10 MG tablet Take 10 mg 2 (two) times daily by mouth.  . hydrochlorothiazide (HYDRODIURIL) 25 MG tablet Take 25 mg daily by mouth.  . isosorbide dinitrate (ISORDIL) 30 MG tablet Take 30 mg 4 (four) times daily by mouth.  . metoprolol (LOPRESSOR) 50 MG tablet Take 50 mg by mouth 2 (two) times daily.  . Multiple Vitamin (MULTIVITAMIN WITH MINERALS) TABS tablet Take  1 tablet by mouth daily.  Marland Kitchen NITROSTAT 0.4 MG SL tablet PLACE 1 TABLET UNDER TONGUE EVERY 5 MINUTES FOR 3 DOSES AS NEEDED.  Marland Kitchen valsartan (DIOVAN) 320 MG tablet Take 320 mg daily by mouth.    No Known Allergies  Social History   Socioeconomic History  . Marital status: Widowed    Spouse name: None  . Number of children: 3  . Years of education: None  . Highest education level: None  Social Needs  . Financial resource strain: None  . Food insecurity - worry: None  . Food insecurity - inability: None  . Transportation needs - medical: None  . Transportation needs - non-medical: None  Occupational History  . None  Tobacco Use  . Smoking status: Never Smoker  . Smokeless tobacco: Never Used  Substance and Sexual Activity  . Alcohol use: No  .  Drug use: No  . Sexual activity: None  Other Topics Concern  . None  Social History Narrative   Is a father of 3 with 3 grandchildren 3 great-grandchildren. He lives with one of his 2 grandsons.   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 He was a war 2 veteran in the [place]. 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   He never smoked. He does not drink alcohol.   He is very active, and a majority. He walks 30+ minutes every day. He also likes to play golf.    family history includes Heart disease in his mother and sister; Heart disease (age of onset: 45) in his brother; Kidney disease in his father.  Wt Readings from Last 3 Encounters:  02/22/17 164 lb (74.4 kg)  01/18/17 164 lb 12.8 oz (74.8 kg)  01/04/17 169 lb 6.4 oz (76.8 kg)    PHYSICAL EXAM BP (!) 124/52   Pulse 80   Ht 5\' 4"  (1.626 m)   Wt 164 lb (74.4 kg)   SpO2 96%   BMI 28.15 kg/m   Physical Exam  Constitutional: He is oriented to person, place, and time. He appears well-developed and well-nourished. No distress.  Relatively well-appearing.    HENT:  Head: Normocephalic and atraumatic.  Neck: No hepatojugular reflux and no JVD present. Carotid bruit is not present.  Cardiovascular: Normal rate, regular rhythm and normal pulses.  Occasional extrasystoles are present. PMI is not displaced. Exam reveals gallop and S4.  Murmur heard.  Medium-pitched harsh crescendo-decrescendo midsystolic murmur is present with a grade of 1/6 at the upper right sternal border. Pulmonary/Chest: Effort normal. No respiratory distress. He has no wheezes. He has no rales.  Abdominal: Soft. Bowel sounds are normal. He exhibits no distension. There is no tenderness.  Musculoskeletal: Normal range of motion. He exhibits no edema.  Neurological: He is alert and oriented to person, place, and time.  Psychiatric: He has a normal mood and affect. His behavior is normal. Judgment and thought content normal.  Nursing note and vitals reviewed.    Adult ECG Report n/a  Other studies Reviewed: Additional studies/ records that were reviewed today include:  Recent Labs:    Lab Results  Component Value Date   CHOL 119 10/21/2016   HDL 57 10/21/2016   LDLCALC 45 10/21/2016   TRIG 87 10/21/2016   CHOLHDL 2.1 10/21/2016    ASSESSMENT / PLAN: Problem List Items Addressed This Visit    CAD S/P percutaneous coronary angioplasty - Primary (Chronic)    Recent cath showed widely patent bare-metal stent in the LAD with distal disease in the circumflex system not amenable to PCI.  No recurrent anginal symptoms.  He is back on Plavix now which probably because of his recent ACS event.  No active bleeding issues. He is on high-dose atorvastatin which we hopefully could titrate down if his lipids are better controlled.  He is on standing dose of Imdur and beta-blocker both for an anginal effect.  No change      Relevant Medications   hydrochlorothiazide (HYDRODIURIL) 25 MG tablet   hydrALAZINE (APRESOLINE) 10 MG tablet   isosorbide dinitrate (ISORDIL) 30 MG tablet    valsartan (DIOVAN) 320 MG tablet   Essential hypertension (Chronic)    Blood pressure looks great today on hydralazine and metoprolol.  Relevant Medications   hydrochlorothiazide (HYDRODIURIL) 25 MG tablet   hydrALAZINE (APRESOLINE) 10 MG tablet   isosorbide dinitrate (ISORDIL) 30 MG tablet   valsartan (DIOVAN) 320 MG tablet   Hyperlipidemia with target low density lipoprotein (LDL) cholesterol less than 70 mg/dL (Chronic)    Most recent labs are from July showed these pretty well controlled.  I think we can probably recommend cutting his atorvastatin down to 40 mg daily.      Relevant Medications   hydrochlorothiazide (HYDRODIURIL) 25 MG tablet   hydrALAZINE (APRESOLINE) 10 MG tablet   isosorbide dinitrate (ISORDIL) 30 MG tablet   valsartan (DIOVAN) 320 MG tablet   PUD (peptic ulcer disease)    If he has any recurrent GI bleed, I would simply stop his Plavix.  There is no reason besides standard process of treating with 1 year of antiplatelet agent post MI to continue it.  He has an old BMS stent.          Current medicines are reviewed at length with the patient today. (+/- concerns) none The following changes have been made:None  Patient Instructions  No changes with medications     Your physician wants you to follow-up in 12 months with DR HARDING. You will receive a reminder letter in the mail two months in advance. If you don't receive a letter, please call our office to schedule the follow-up appointment.  If you need a refill on your cardiac medications before your next appointment, please call your pharmacy.    Studies Ordered:   No orders of the defined types were placed in this encounter.     Glenetta Hew, M.D., M.S. Interventional Cardiologist   Pager # [phone] Phone # [phone] [address] [place], [place] [address]

## 2017-02-24 ENCOUNTER — Encounter: Payer: Self-pay | Admitting: Cardiology

## 2017-02-24 NOTE — Assessment & Plan Note (Signed)
If he has any recurrent GI bleed, I would simply stop his Plavix.  There is no reason besides standard process of treating with 1 year of antiplatelet agent post MI to continue it.  He has an old BMS stent.

## 2017-02-24 NOTE — Assessment & Plan Note (Signed)
Blood pressure looks great today on hydralazine and metoprolol.

## 2017-02-24 NOTE — Assessment & Plan Note (Signed)
Most recent labs are from July showed these pretty well controlled.  I think we can probably recommend cutting his atorvastatin down to 40 mg daily.

## 2017-02-24 NOTE — Assessment & Plan Note (Addendum)
Recent cath showed widely patent bare-metal stent in the LAD with distal disease in the circumflex system not amenable to PCI.  No recurrent anginal symptoms.  He is back on Plavix now which probably because of his recent ACS event.  No active bleeding issues. He is on high-dose atorvastatin which we hopefully could titrate down if his lipids are better controlled.  He is on standing dose of Imdur and beta-blocker both for an anginal effect.  No change

## 2017-02-28 DIAGNOSIS — S72141D Displaced intertrochanteric fracture of right femur, subsequent encounter for closed fracture with routine healing: Secondary | ICD-10-CM | POA: Diagnosis not present

## 2017-08-08 DIAGNOSIS — E785 Hyperlipidemia, unspecified: Secondary | ICD-10-CM | POA: Diagnosis not present

## 2017-08-08 DIAGNOSIS — I119 Hypertensive heart disease without heart failure: Secondary | ICD-10-CM | POA: Diagnosis not present

## 2017-08-08 DIAGNOSIS — H6122 Impacted cerumen, left ear: Secondary | ICD-10-CM | POA: Diagnosis not present

## 2017-09-03 DIAGNOSIS — R6889 Other general symptoms and signs: Secondary | ICD-10-CM | POA: Diagnosis not present

## 2017-09-28 ENCOUNTER — Telehealth: Payer: Self-pay | Admitting: *Deleted

## 2017-09-28 NOTE — Telephone Encounter (Signed)
-----   Message from Stephani Police, RN sent at 09/25/2017 12:04 PM EDT ----- Contact: [phone] Pt called in asking for letter to mailed to his home address stating he had a Cardiac Cath w/ dates that procedure was done on it.   Thank you.

## 2017-09-28 NOTE — Telephone Encounter (Signed)
Spoke to patient. He states he needs a letter stating that he had cardiac cath in 2018 . He states he will send  Information to  [facility]  - he states he has reverse mortgage and they need the information Letter mailed to patient

## 2018-02-25 ENCOUNTER — Ambulatory Visit: Payer: Medicare Other | Admitting: Cardiology

## 2018-03-06 ENCOUNTER — Encounter: Payer: Self-pay | Admitting: Adult Health

## 2018-03-06 ENCOUNTER — Ambulatory Visit (INDEPENDENT_AMBULATORY_CARE_PROVIDER_SITE_OTHER): Payer: Medicare Other | Admitting: Adult Health

## 2018-03-06 VITALS — BP 127/79 | HR 69 | Ht 64.0 in | Wt 173.4 lb

## 2018-03-06 DIAGNOSIS — I10 Essential (primary) hypertension: Secondary | ICD-10-CM

## 2018-03-06 DIAGNOSIS — I2510 Atherosclerotic heart disease of native coronary artery without angina pectoris: Secondary | ICD-10-CM | POA: Diagnosis not present

## 2018-03-06 DIAGNOSIS — E7800 Pure hypercholesterolemia, unspecified: Secondary | ICD-10-CM | POA: Diagnosis not present

## 2018-03-06 NOTE — Patient Instructions (Signed)
Medication Instructions:  STOP PLAVIX If you need a refill on your cardiac medications before your next appointment, please call your pharmacy.  Follow-Up: You will need a follow up appointment in 9 MONTHS.  Please call our office 2 months in advance(AUG 2020) to schedule the (OCT 2020) appointment.  You may see  DR Cecil Cranker, DNP, AACC -or- one of the following Advanced Practice Providers on your designated Care Team:    . Rosaria Ferries, PA-C . Jory Sims, DNP, ANP  Labwork: If you have labs (blood work) drawn today and your tests are completely normal, you will receive your results ONLY by: . MyChart Message (if you have MyChart) -OR- . A paper copy in the mail  At [facility], you and your health needs are our priority.  As part of our continuing mission to provide you with exceptional heart care, we have created designated Provider Care Teams.  These Care Teams include your primary Cardiologist (physician) and Advanced Practice Providers (APPs -  Physician Assistants and Nurse Practitioners) who all work together to provide you with the care you need, when you need it.  Thank you for choosing [facility]!!

## 2018-03-06 NOTE — Progress Notes (Signed)
Cardiology Office Note   Date:  03/06/2018   ID:  Anthony Galvan, DOB Sep 03, 1921, MRN [id number]  PCP:  [name], MD  Cardiologist:  Dr. Ellyn Hack  Chief Complaint  Patient presents with  . Coronary Artery Disease     History of Present Illness: Anthony Galvan is a 84 y.o. male who presents for ongoing assessment and management of CAD, with BMS to the LAD in 2014, no longer on Plavix due to PUD and erosive gastritis. Other history includes HTN, HL, OA, GERD, and H-Pylori gastritis. He has had a second event requiring repeat cardiac cath on 10/2016 revealing 2 vessel CAD, with patency of the stented segment in the proximal LAD and severe heavily calcified distal circumflex.left PDA stenosis, unfavorable to PCI. He was restarted on Plavix,  atorvastatin and nitrates.   Was last seen in the office by Dr Ellyn Hack on 02/22/2017. At that time he was doing well, but had not gotten his energy back yet. He was to have Plavix stopped on this appointment.   He has been followed by the [facility] and has had recent labs, MRI and LE doppler studies within the last month. He is awaiting the results. He denies chest pain, DOE, and but he has become very sedentary. Reports that since he had right hip fx and repair he does not walk as much. Used to walk 5 miles a day. Minimal chest pressure when he exerts himself goes away with rest.   Past Medical History:  Diagnosis Date  . CAD S/P percutaneous coronary angioplasty 08/01/2012   PCI of mid LAD with a VeriFlex Bare Metal Stent 3.0 mm x 12 mm - post-dilated to 3.5 mm.;  Catheterization in July 2018 showed with severe heavily calcified distal circumflex-left PDA stenosis.  Not favorable for PCI.  . Duodenal ulcer May 2011   on EGD  . GERD (gastroesophageal reflux disease)   . Helicobacter pylori gastritis MAY 2012 HP STOOL AG NEG  . HTN (hypertension)   . Hyperlipidemia   . MI (myocardial infarction) ([place]) 1976, 1980, [place] for at [facility] and  [place] (Dr. Iona Beard)  . Osteoarthritis   . S/P colonoscopy 2011   normal, internal hemorrhoids    Past Surgical History:  Procedure Laterality Date  . CARDIAC CATHETERIZATION  01/30/2007   D1 75 %, mid LAD 50%, 50-70% circumflex, 50-70% RCA.(Dr. Adora Fridge)  . CARDIAC CATHETERIZATION  12/11/2001   normal L main, small RCA, dominant LL Cfx with mild diffuse disease, LAD with mid 10-20% stenosis, ramus intermedius with mild diffuse disease (Dr. Jackie Plum)  . CATARACT EXTRACTION    . COLONOSCOPY  OCT 2011 ARS   SML [place]   in setting of MI  . ESOPHAGOGASTRODUODENOSCOPY N/A 10/09/2016   Procedure: ESOPHAGOGASTRODUODENOSCOPY (EGD);  Surgeon: Danie Binder, MD;  [facility];  Service: Endoscopy;  Laterality: N/A;  . FEMUR IM NAIL Right 12/01/2016   Procedure: INTRAMEDULLARY (IM) NAIL RIGHT HIP;  Surgeon: Altamese Osceola, MD;  Location: [facility];  Service: Orthopedics;  Laterality: Right;  . LEFT HEART CATH AND CORONARY ANGIOGRAPHY N/A 10/23/2016   Procedure: Left Heart Cath and Coronary Angiography;  Surgeon: Sherren Mocha, MD;  Location: [facility] CV LAB;  Service: Cardiovascular: Two-vessel CAD (left dominant).  Patent BMS in p LAD.  Severe, heavily calcified dCx-LPDA lesion.  Not favorable for PCI.  Marland Kitchen LEFT HEART CATHETERIZATION WITH CORONARY ANGIOGRAM N/A 08/01/2012  Procedure: LEFT HEART CATHETERIZATION WITH CORONARY ANGIOGRAM;  Surgeon: Leonie Man, MD;  Location: [facility] CATH LAB: Mid LAD 99% apple core; D2 ostial 70-80% (2 small for PCI) small nondominant RCA 60-70%. Distal circumflex/L PDA ~50% --> PCI of LAD  . NM MYOCAR PERF WALL MOTION  08/2003   adenosine stress - focal decreased perfusion defect in distal inferior wall, no significant ischemic changes  . PERCUTANEOUS STENT INTERVENTION  08/01/2012   Procedure: PERCUTANEOUS STENT INTERVENTION;  Surgeon: Leonie Man, MD;  Location: [facility] CATH LAB; ;PCI of mid LAD with a VeriFlex Bare  Metal Stent 3.0 mm x 12 mm - post-dilated to 3.5 mm.  . TRANSTHORACIC ECHOCARDIOGRAM  10/2016   mild LVH - 60-65%. Gr 1 DD. No RWMA. Mild MR. Mod TR. Mild RA dilation.  PAP ~ 60 mmHg.  Marland Kitchen UPPER GASTROINTESTINAL ENDOSCOPY  08/2009   MELENA, HEMATEMESIS --> DUODENAL ULCER, Bx: H PYLORI POS     Current Outpatient Medications  Medication Sig Dispense Refill  . acetaminophen (TYLENOL) 325 MG tablet Take 2 tablets (650 mg total) by mouth 3 (three) times daily as needed (pain).    Marland Kitchen atorvastatin (LIPITOR) 80 MG tablet Take 1 tablet (80 mg total) by mouth daily at 6 PM. 30 tablet 5  . cetirizine (ZYRTEC) 5 MG tablet Take 10 mg by mouth daily.     . clopidogrel (PLAVIX) 75 MG tablet Take 75 mg daily by mouth.    . hydrALAZINE (APRESOLINE) 10 MG tablet Take 10 mg 2 (two) times daily by mouth.    . hydrochlorothiazide (HYDRODIURIL) 25 MG tablet Take 25 mg daily by mouth.    . isosorbide dinitrate (ISORDIL) 30 MG tablet Take 30 mg 4 (four) times daily by mouth.    . metoprolol (LOPRESSOR) 50 MG tablet Take 50 mg by mouth 2 (two) times daily.    . Multiple Vitamin (MULTIVITAMIN WITH MINERALS) TABS tablet Take 1 tablet by mouth daily.    Marland Kitchen NITROSTAT 0.4 MG SL tablet PLACE 1 TABLET UNDER TONGUE EVERY 5 MINUTES FOR 3 DOSES AS NEEDED. 25 tablet 6  . valsartan (DIOVAN) 320 MG tablet Take 320 mg daily by mouth.     No current facility-administered medications for this visit.     Allergies:   Patient has no known allergies.    Social History:  The patient  reports that he has never smoked. He has never used smokeless tobacco. He reports that he does not drink alcohol or use drugs.   Family History:  The patient's family history includes Heart disease in his mother and sister; Heart disease (age of onset: 39) in his brother; Kidney disease in his father.    ROS: All other systems are reviewed and negative. Unless otherwise mentioned in H&P    PHYSICAL EXAM: VS:  BP 127/79   Pulse 69   Ht 5\' 4"   (1.626 m)   Wt 173 lb 6.4 oz (78.7 kg)   BMI 29.76 kg/m  , BMI Body mass index is 29.76 kg/m. GEN: Well nourished, well developed, in no acute distress HEENT: normal Neck: no JVD, carotid bruits, or masses Cardiac: RRR; 1/6 systolic murmurs, rubs, or gallops,no edema, diminished pulses in the DP.  Respiratory:  Mild crackles in the bases.No wheezes or rhonchi.  GI: soft, nontender, nondistended, + BS MS: no deformity or atrophy Skin: warm and dry, no rash Neuro:  Strength and sensation are intact Psych: euthymic mood, full affect   EKG: NSR 1 st degree AV block.  Rate of 66 bpm.   Recent Labs: No results found for requested labs within last 8760 hours.    Lipid Panel    Component Value Date/Time   CHOL 119 10/21/2016 0423   TRIG 87 10/21/2016 0423   HDL 57 10/21/2016 0423   CHOLHDL 2.1 10/21/2016 0423   VLDL 17 10/21/2016 0423   LDLCALC 45 10/21/2016 0423      Wt Readings from Last 3 Encounters:  03/06/18 173 lb 6.4 oz (78.7 kg)  02/22/17 164 lb (74.4 kg)  01/18/17 164 lb 12.8 oz (74.8 kg)      Other studies Reviewed:  Cath 10/2016:  1. Two-vessel coronary artery disease (left dominant) with patency of the stented segment in the proximal LAD and severe heavily calcified distal circumflex/left PDA stenosis unfavorable for PCI  2. Known normal LV function by noninvasive assessment  3. Normal LVEDP  Recommend: Medical therapy for CAD. The distal circumflex/PDA lesion is severely calcified with a distal vessel location that is unfavorable for PCI and I would be very hesitant to use atherectomy in this distal location.    2D Echo: mild LVH - 60-65%. Gr 1 DD. No RWMA. Mild MR. Mod TR. Mild RA dilation.  PAP ~ 60 mmHg.   ASSESSMENT AND PLAN:  1. CAD:  Has had PCI to his in the proximal LAD, and was found to have severe heavily calcified distal Cx/left PDA stenosis not favorable for PCI. He is on medical therapy with BB, statin and ASA. I will stop Plavix as he has been  on this for over a year now.    2. Hypertension; BP is controlled currently on regimen. Continue hydralazine, nitrates, and ARB. No changes in his regimen. Labs were drawn last week by [place].   3. Hypercholesterolemia: Continue statin therapy. LDL goal , 70. [facility] [place] [facility] of [facility] lab results and radiology testing.                                         Current medicines are reviewed at length with the patient today.    Labs/ tests ordered today include: None requesting results.   Phill Myron. [place] Pugh, ANP, AACC   03/06/2018 1:33 PM    [facility] [address] Office [phone] Fax [phone]

## 2018-05-07 ENCOUNTER — Emergency Department (HOSPITAL_COMMUNITY): Payer: Medicare Other

## 2018-05-07 ENCOUNTER — Encounter (HOSPITAL_COMMUNITY): Payer: Self-pay | Admitting: Emergency Medicine

## 2018-05-07 ENCOUNTER — Inpatient Hospital Stay (HOSPITAL_COMMUNITY)
Admission: EM | Admit: 2018-05-07 | Discharge: 2018-05-13 | DRG: 482 | Disposition: A | Discharge status: Skilled Nursing Facility | Payer: Medicare Other | Attending: Family Medicine | Admitting: Family Medicine

## 2018-05-07 ENCOUNTER — Inpatient Hospital Stay (HOSPITAL_COMMUNITY): Payer: Medicare Other | Admitting: Certified Registered"

## 2018-05-07 ENCOUNTER — Inpatient Hospital Stay (HOSPITAL_COMMUNITY): Payer: Medicare Other

## 2018-05-07 ENCOUNTER — Other Ambulatory Visit: Payer: Self-pay

## 2018-05-07 ENCOUNTER — Encounter (HOSPITAL_COMMUNITY)
Admission: EM | Disposition: A | Discharge status: Skilled Nursing Facility | Payer: Self-pay | Source: Home / Self Care | Attending: Family Medicine

## 2018-05-07 DIAGNOSIS — I2510 Atherosclerotic heart disease of native coronary artery without angina pectoris: Secondary | ICD-10-CM

## 2018-05-07 DIAGNOSIS — N183 Chronic kidney disease, stage 3 (moderate): Secondary | ICD-10-CM | POA: Diagnosis present

## 2018-05-07 DIAGNOSIS — E559 Vitamin D deficiency, unspecified: Secondary | ICD-10-CM | POA: Diagnosis present

## 2018-05-07 DIAGNOSIS — M1990 Unspecified osteoarthritis, unspecified site: Secondary | ICD-10-CM | POA: Diagnosis present

## 2018-05-07 DIAGNOSIS — Z841 Family history of disorders of kidney and ureter: Secondary | ICD-10-CM

## 2018-05-07 DIAGNOSIS — S7292XA Unspecified fracture of left femur, initial encounter for closed fracture: Secondary | ICD-10-CM | POA: Diagnosis not present

## 2018-05-07 DIAGNOSIS — Z9181 History of falling: Secondary | ICD-10-CM | POA: Diagnosis not present

## 2018-05-07 DIAGNOSIS — T148XXA Other injury of unspecified body region, initial encounter: Secondary | ICD-10-CM | POA: Diagnosis present

## 2018-05-07 DIAGNOSIS — Z741 Need for assistance with personal care: Secondary | ICD-10-CM | POA: Diagnosis not present

## 2018-05-07 DIAGNOSIS — Z8711 Personal history of peptic ulcer disease: Secondary | ICD-10-CM

## 2018-05-07 DIAGNOSIS — M791 Myalgia, unspecified site: Secondary | ICD-10-CM | POA: Diagnosis not present

## 2018-05-07 DIAGNOSIS — D649 Anemia, unspecified: Secondary | ICD-10-CM | POA: Diagnosis present

## 2018-05-07 DIAGNOSIS — K219 Gastro-esophageal reflux disease without esophagitis: Secondary | ICD-10-CM | POA: Diagnosis present

## 2018-05-07 DIAGNOSIS — I252 Old myocardial infarction: Secondary | ICD-10-CM

## 2018-05-07 DIAGNOSIS — I10 Essential (primary) hypertension: Secondary | ICD-10-CM | POA: Diagnosis not present

## 2018-05-07 DIAGNOSIS — Z7902 Long term (current) use of antithrombotics/antiplatelets: Secondary | ICD-10-CM | POA: Diagnosis not present

## 2018-05-07 DIAGNOSIS — Z8249 Family history of ischemic heart disease and other diseases of the circulatory system: Secondary | ICD-10-CM

## 2018-05-07 DIAGNOSIS — W010XXA Fall on same level from slipping, tripping and stumbling without subsequent striking against object, initial encounter: Secondary | ICD-10-CM | POA: Diagnosis present

## 2018-05-07 DIAGNOSIS — Z79899 Other long term (current) drug therapy: Secondary | ICD-10-CM

## 2018-05-07 DIAGNOSIS — S72142D Displaced intertrochanteric fracture of left femur, subsequent encounter for closed fracture with routine healing: Secondary | ICD-10-CM | POA: Diagnosis not present

## 2018-05-07 DIAGNOSIS — M6281 Muscle weakness (generalized): Secondary | ICD-10-CM | POA: Diagnosis not present

## 2018-05-07 DIAGNOSIS — M2550 Pain in unspecified joint: Secondary | ICD-10-CM | POA: Diagnosis not present

## 2018-05-07 DIAGNOSIS — Z419 Encounter for procedure for purposes other than remedying health state, unspecified: Secondary | ICD-10-CM

## 2018-05-07 DIAGNOSIS — Z9849 Cataract extraction status, unspecified eye: Secondary | ICD-10-CM

## 2018-05-07 DIAGNOSIS — Z7401 Bed confinement status: Secondary | ICD-10-CM | POA: Diagnosis not present

## 2018-05-07 DIAGNOSIS — S299XXA Unspecified injury of thorax, initial encounter: Secondary | ICD-10-CM | POA: Diagnosis not present

## 2018-05-07 DIAGNOSIS — R29898 Other symptoms and signs involving the musculoskeletal system: Secondary | ICD-10-CM | POA: Diagnosis not present

## 2018-05-07 DIAGNOSIS — S72142A Displaced intertrochanteric fracture of left femur, initial encounter for closed fracture: Principal | ICD-10-CM

## 2018-05-07 DIAGNOSIS — R262 Difficulty in walking, not elsewhere classified: Secondary | ICD-10-CM | POA: Diagnosis not present

## 2018-05-07 DIAGNOSIS — W19XXXA Unspecified fall, initial encounter: Secondary | ICD-10-CM | POA: Diagnosis not present

## 2018-05-07 DIAGNOSIS — S72002A Fracture of unspecified part of neck of left femur, initial encounter for closed fracture: Secondary | ICD-10-CM | POA: Diagnosis not present

## 2018-05-07 DIAGNOSIS — Z955 Presence of coronary angioplasty implant and graft: Secondary | ICD-10-CM

## 2018-05-07 DIAGNOSIS — E785 Hyperlipidemia, unspecified: Secondary | ICD-10-CM | POA: Diagnosis present

## 2018-05-07 DIAGNOSIS — I129 Hypertensive chronic kidney disease with stage 1 through stage 4 chronic kidney disease, or unspecified chronic kidney disease: Secondary | ICD-10-CM | POA: Diagnosis not present

## 2018-05-07 DIAGNOSIS — Z4789 Encounter for other orthopedic aftercare: Secondary | ICD-10-CM | POA: Diagnosis not present

## 2018-05-07 DIAGNOSIS — Z8781 Personal history of (healed) traumatic fracture: Secondary | ICD-10-CM

## 2018-05-07 DIAGNOSIS — E871 Hypo-osmolality and hyponatremia: Secondary | ICD-10-CM | POA: Diagnosis not present

## 2018-05-07 HISTORY — PX: FEMUR IM NAIL: SHX1597

## 2018-05-07 LAB — CBC WITH DIFFERENTIAL/PLATELET
Abs Immature Granulocytes: 0 10*3/uL (ref 0.00–0.07)
Basophils Absolute: 0.1 10*3/uL (ref 0.0–0.1)
Basophils Relative: 1 %
Eosinophils Absolute: 0 10*3/uL (ref 0.0–0.5)
Eosinophils Relative: 0 %
HCT: 47.2 % (ref 39.0–52.0)
Hemoglobin: 14.2 g/dL (ref 13.0–17.0)
LYMPHS PCT: 23 %
Lymphs Abs: 2.4 10*3/uL (ref 0.7–4.0)
MCH: 27.9 pg (ref 26.0–34.0)
MCHC: 30.1 g/dL (ref 30.0–36.0)
MCV: 92.7 fL (ref 80.0–100.0)
Monocytes Absolute: 0.3 10*3/uL (ref 0.1–1.0)
Monocytes Relative: 3 %
Neutro Abs: 7.7 10*3/uL (ref 1.7–7.7)
Neutrophils Relative %: 73 %
Platelets: 133 10*3/uL — ABNORMAL LOW (ref 150–400)
RBC: 5.09 MIL/uL (ref 4.22–5.81)
RDW: 11.4 % — ABNORMAL LOW (ref 11.5–15.5)
WBC: 10.6 10*3/uL — ABNORMAL HIGH (ref 4.0–10.5)
nRBC: 0 % (ref 0.0–0.2)
nRBC: 0 /100 WBC

## 2018-05-07 LAB — BASIC METABOLIC PANEL
Anion gap: 12 (ref 5–15)
BUN: 19 mg/dL (ref 8–23)
CO2: 25 mmol/L (ref 22–32)
Calcium: 9.4 mg/dL (ref 8.9–10.3)
Chloride: 101 mmol/L (ref 98–111)
Creatinine, Ser: 1.41 mg/dL — ABNORMAL HIGH (ref 0.61–1.24)
GFR calc Af Amer: 49 mL/min — ABNORMAL LOW (ref >60)
GFR calc non Af Amer: 42 mL/min — ABNORMAL LOW (ref >60)
Glucose, Bld: 99 mg/dL (ref 70–99)
Potassium: 4 mmol/L (ref 3.5–5.1)
Sodium: 138 mmol/L (ref 135–145)

## 2018-05-07 SURGERY — INSERTION, INTRAMEDULLARY ROD, FEMUR
Anesthesia: General | Site: Leg Upper | Laterality: Left

## 2018-05-07 MED ORDER — SUGAMMADEX SODIUM 200 MG/2ML IV SOLN
INTRAVENOUS | Status: DC | PRN
  Administered 2018-05-07: 200 mg via INTRAVENOUS

## 2018-05-07 MED ORDER — ROCURONIUM BROMIDE 10 MG/ML (PF) SYRINGE
PREFILLED_SYRINGE | INTRAVENOUS | Status: DC | PRN
  Administered 2018-05-07: 50 mg via INTRAVENOUS

## 2018-05-07 MED ORDER — BUPIVACAINE HCL (PF) 0.25 % IJ SOLN
INTRAMUSCULAR | Status: AC
  Filled 2018-05-07: qty 30

## 2018-05-07 MED ORDER — PROPOFOL 10 MG/ML IV BOLUS
INTRAVENOUS | Status: AC
  Filled 2018-05-07: qty 20

## 2018-05-07 MED ORDER — FENTANYL CITRATE (PF) 100 MCG/2ML IJ SOLN
INTRAMUSCULAR | Status: AC
  Administered 2018-05-07: 50 ug via INTRAVENOUS
  Filled 2018-05-07: qty 2

## 2018-05-07 MED ORDER — NITROGLYCERIN 0.4 MG SL SUBL: 0.4000 mg | SUBLINGUAL_TABLET | SUBLINGUAL | Status: DC | PRN

## 2018-05-07 MED ORDER — ONDANSETRON HCL 4 MG PO TABS: 4.0000 mg | ORAL_TABLET | Freq: Four times a day (QID) | ORAL | Status: DC | PRN

## 2018-05-07 MED ORDER — LACTATED RINGERS IV SOLN
INTRAVENOUS | Status: DC
  Administered 2018-05-07 (×2): via INTRAVENOUS

## 2018-05-07 MED ORDER — VASOPRESSIN 20 UNIT/ML IV SOLN
INTRAVENOUS | Status: AC
  Filled 2018-05-07: qty 1

## 2018-05-07 MED ORDER — ACETAMINOPHEN 325 MG PO TABS
650.0000 mg | ORAL_TABLET | Freq: Four times a day (QID) | ORAL | Status: DC
  Administered 2018-05-07 – 2018-05-13 (×23): 650 mg via ORAL
  Filled 2018-05-07 (×23): qty 2

## 2018-05-07 MED ORDER — SODIUM CHLORIDE 0.45 % IV SOLN
INTRAVENOUS | Status: DC
  Administered 2018-05-07: 21:00:00 via INTRAVENOUS

## 2018-05-07 MED ORDER — TRAMADOL HCL 50 MG PO TABS
50.0000 mg | ORAL_TABLET | Freq: Four times a day (QID) | ORAL | Status: DC | PRN
  Administered 2018-05-08 – 2018-05-10 (×4): 50 mg via ORAL
  Filled 2018-05-07 (×4): qty 1

## 2018-05-07 MED ORDER — METOCLOPRAMIDE HCL 5 MG/ML IJ SOLN: 5.0000 mg | Freq: Three times a day (TID) | INTRAMUSCULAR | Status: DC | PRN

## 2018-05-07 MED ORDER — SODIUM CHLORIDE (PF) 0.9 % IJ SOLN
INTRAMUSCULAR | Status: AC
  Filled 2018-05-07: qty 10

## 2018-05-07 MED ORDER — CEFAZOLIN SODIUM-DEXTROSE 1-4 GM/50ML-% IV SOLN
1.0000 g | Freq: Four times a day (QID) | INTRAVENOUS | Status: AC
  Administered 2018-05-07 – 2018-05-08 (×3): 1 g via INTRAVENOUS
  Filled 2018-05-07 (×3): qty 50

## 2018-05-07 MED ORDER — FENTANYL CITRATE (PF) 100 MCG/2ML IJ SOLN
INTRAMUSCULAR | Status: DC | PRN
  Administered 2018-05-07 (×3): 50 ug via INTRAVENOUS

## 2018-05-07 MED ORDER — ISOSORBIDE MONONITRATE ER 30 MG PO TB24
30.0000 mg | ORAL_TABLET | Freq: Every day | ORAL | Status: DC
  Administered 2018-05-08 – 2018-05-13 (×6): 30 mg via ORAL
  Filled 2018-05-07 (×6): qty 1

## 2018-05-07 MED ORDER — ACETAMINOPHEN 500 MG PO TABS
1000.0000 mg | ORAL_TABLET | Freq: Once | ORAL | Status: AC
  Administered 2018-05-07: 1000 mg via ORAL
  Filled 2018-05-07: qty 2

## 2018-05-07 MED ORDER — METOCLOPRAMIDE HCL 5 MG PO TABS: 5.0000 mg | ORAL_TABLET | Freq: Three times a day (TID) | ORAL | Status: DC | PRN

## 2018-05-07 MED ORDER — HYDRALAZINE HCL 20 MG/ML IJ SOLN: 2.0000 mg | INTRAMUSCULAR | Status: DC | PRN

## 2018-05-07 MED ORDER — STERILE WATER FOR IRRIGATION IR SOLN
Status: DC | PRN
  Administered 2018-05-07: 1000 mL

## 2018-05-07 MED ORDER — ONDANSETRON HCL 4 MG/2ML IJ SOLN
INTRAMUSCULAR | Status: AC
  Filled 2018-05-07: qty 2

## 2018-05-07 MED ORDER — METOPROLOL TARTRATE 5 MG/5ML IV SOLN
2.5000 mg | Freq: Four times a day (QID) | INTRAVENOUS | Status: DC
  Administered 2018-05-07: 2.5 mg via INTRAVENOUS
  Filled 2018-05-07: qty 5

## 2018-05-07 MED ORDER — EPHEDRINE 5 MG/ML INJ
INTRAVENOUS | Status: AC
  Filled 2018-05-07: qty 10

## 2018-05-07 MED ORDER — 0.9 % SODIUM CHLORIDE (POUR BTL) OPTIME
TOPICAL | Status: DC | PRN
  Administered 2018-05-07: 1000 mL

## 2018-05-07 MED ORDER — METOPROLOL TARTRATE 50 MG PO TABS
50.0000 mg | ORAL_TABLET | Freq: Two times a day (BID) | ORAL | Status: DC
  Administered 2018-05-07 – 2018-05-10 (×4): 50 mg via ORAL
  Filled 2018-05-07 (×6): qty 1

## 2018-05-07 MED ORDER — LIDOCAINE 2% (20 MG/ML) 5 ML SYRINGE
INTRAMUSCULAR | Status: DC | PRN
  Administered 2018-05-07: 60 mg via INTRAVENOUS

## 2018-05-07 MED ORDER — MORPHINE SULFATE (PF) 2 MG/ML IV SOLN
2.0000 mg | Freq: Once | INTRAVENOUS | Status: AC
  Administered 2018-05-07: 2 mg via INTRAVENOUS
  Filled 2018-05-07: qty 1

## 2018-05-07 MED ORDER — CHLORHEXIDINE GLUCONATE 4 % EX LIQD: 60.0000 mL | Freq: Once | CUTANEOUS | Status: DC

## 2018-05-07 MED ORDER — ROCURONIUM BROMIDE 50 MG/5ML IV SOSY
PREFILLED_SYRINGE | INTRAVENOUS | Status: AC
  Filled 2018-05-07: qty 5

## 2018-05-07 MED ORDER — ONDANSETRON HCL 4 MG/2ML IJ SOLN: 4.0000 mg | Freq: Four times a day (QID) | INTRAMUSCULAR | Status: DC | PRN

## 2018-05-07 MED ORDER — PHENYLEPHRINE 40 MCG/ML (10ML) SYRINGE FOR IV PUSH (FOR BLOOD PRESSURE SUPPORT)
PREFILLED_SYRINGE | INTRAVENOUS | Status: DC | PRN
  Administered 2018-05-07: 80 ug via INTRAVENOUS

## 2018-05-07 MED ORDER — ONDANSETRON HCL 4 MG/2ML IJ SOLN
INTRAMUSCULAR | Status: DC | PRN
  Administered 2018-05-07: 4 mg via INTRAVENOUS

## 2018-05-07 MED ORDER — ACETAMINOPHEN 650 MG RE SUPP
650.0000 mg | Freq: Four times a day (QID) | RECTAL | Status: DC
  Filled 2018-05-07: qty 1

## 2018-05-07 MED ORDER — MORPHINE SULFATE (PF) 2 MG/ML IV SOLN: 0.5000 mg | INTRAVENOUS | Status: DC | PRN

## 2018-05-07 MED ORDER — DEXAMETHASONE SODIUM PHOSPHATE 10 MG/ML IJ SOLN
INTRAMUSCULAR | Status: AC
  Filled 2018-05-07: qty 1

## 2018-05-07 MED ORDER — POLYETHYLENE GLYCOL 3350 17 G PO PACK
17.0000 g | PACK | Freq: Every day | ORAL | Status: DC
  Administered 2018-05-08 – 2018-05-12 (×5): 17 g via ORAL
  Filled 2018-05-07 (×5): qty 1

## 2018-05-07 MED ORDER — HYDROCODONE-ACETAMINOPHEN 7.5-325 MG PO TABS
1.0000 | ORAL_TABLET | ORAL | Status: DC | PRN
  Administered 2018-05-07 – 2018-05-08 (×2): 2 via ORAL
  Filled 2018-05-07: qty 2

## 2018-05-07 MED ORDER — HYDROCODONE-ACETAMINOPHEN 7.5-325 MG PO TABS
ORAL_TABLET | ORAL | Status: AC
  Filled 2018-05-07: qty 2

## 2018-05-07 MED ORDER — ACETAMINOPHEN 500 MG PO TABS: 500.0000 mg | ORAL_TABLET | Freq: Four times a day (QID) | ORAL | Status: DC

## 2018-05-07 MED ORDER — CEFAZOLIN SODIUM-DEXTROSE 2-4 GM/100ML-% IV SOLN
2.0000 g | INTRAVENOUS | Status: AC
  Administered 2018-05-07: 2 g via INTRAVENOUS
  Filled 2018-05-07: qty 100

## 2018-05-07 MED ORDER — SUCCINYLCHOLINE CHLORIDE 200 MG/10ML IV SOSY
PREFILLED_SYRINGE | INTRAVENOUS | Status: AC
  Filled 2018-05-07: qty 10

## 2018-05-07 MED ORDER — ATORVASTATIN CALCIUM 80 MG PO TABS
80.0000 mg | ORAL_TABLET | Freq: Every day | ORAL | Status: DC
  Administered 2018-05-08 – 2018-05-13 (×6): 80 mg via ORAL
  Filled 2018-05-07 (×6): qty 1

## 2018-05-07 MED ORDER — MORPHINE SULFATE (PF) 2 MG/ML IV SOLN: 1.0000 mg | INTRAVENOUS | Status: DC | PRN

## 2018-05-07 MED ORDER — DOCUSATE SODIUM 100 MG PO CAPS
100.0000 mg | ORAL_CAPSULE | Freq: Two times a day (BID) | ORAL | Status: DC
  Administered 2018-05-08 – 2018-05-09 (×3): 100 mg via ORAL
  Filled 2018-05-07 (×4): qty 1

## 2018-05-07 MED ORDER — LIDOCAINE 2% (20 MG/ML) 5 ML SYRINGE
INTRAMUSCULAR | Status: AC
  Filled 2018-05-07: qty 5

## 2018-05-07 MED ORDER — FENTANYL CITRATE (PF) 100 MCG/2ML IJ SOLN
50.0000 ug | Freq: Once | INTRAMUSCULAR | Status: AC
  Administered 2018-05-07: 50 ug via INTRAVENOUS

## 2018-05-07 MED ORDER — PROPOFOL 10 MG/ML IV BOLUS
INTRAVENOUS | Status: DC | PRN
  Administered 2018-05-07: 50 mg via INTRAVENOUS

## 2018-05-07 MED ORDER — NITROGLYCERIN 0.3 MG SL SUBL
0.3000 mg | SUBLINGUAL_TABLET | SUBLINGUAL | Status: DC | PRN
  Filled 2018-05-07: qty 100

## 2018-05-07 MED ORDER — POVIDONE-IODINE 10 % EX SWAB: 2.0000 "application " | Freq: Once | CUTANEOUS | Status: DC

## 2018-05-07 MED ORDER — FENTANYL CITRATE (PF) 250 MCG/5ML IJ SOLN
INTRAMUSCULAR | Status: AC
  Filled 2018-05-07: qty 5

## 2018-05-07 MED ORDER — SODIUM CHLORIDE 0.9 % IV SOLN
INTRAVENOUS | Status: DC | PRN
  Administered 2018-05-07: 30 ug/min via INTRAVENOUS

## 2018-05-07 SURGICAL SUPPLY — 35 items
ALCOHOL 70% 16 OZ (MISCELLANEOUS) ×3 IMPLANT
BIT DRILL FLUTED FEMUR 4.2/3 (BIT) ×2 IMPLANT
BNDG COHESIVE 4X5 TAN STRL (GAUZE/BANDAGES/DRESSINGS) ×3 IMPLANT
BNDG COHESIVE 6X5 TAN STRL LF (GAUZE/BANDAGES/DRESSINGS) ×3 IMPLANT
COVER PERINEAL POST (MISCELLANEOUS) ×3 IMPLANT
COVER SURGICAL LIGHT HANDLE (MISCELLANEOUS) ×3 IMPLANT
COVER WAND RF STERILE (DRAPES) ×3 IMPLANT
DRAPE HALF SHEET 40X57 (DRAPES) ×2 IMPLANT
DRAPE INCISE IOBAN 66X45 STRL (DRAPES) ×2 IMPLANT
DRAPE STERI IOBAN 125X83 (DRAPES) ×9 IMPLANT
DURAPREP 26ML APPLICATOR (WOUND CARE) ×3 IMPLANT
ELECT REM PT RETURN 9FT ADLT (ELECTROSURGICAL) ×3
ELECTRODE REM PT RTRN 9FT ADLT (ELECTROSURGICAL) ×1 IMPLANT
GAUZE SPONGE 4X4 12PLY STRL (GAUZE/BANDAGES/DRESSINGS) ×2 IMPLANT
GAUZE SPONGE 4X4 12PLY STRL LF (GAUZE/BANDAGES/DRESSINGS) ×3 IMPLANT
GAUZE XEROFORM 1X8 LF (GAUZE/BANDAGES/DRESSINGS) ×3 IMPLANT
GLOVE BIO SURGEON STRL SZ7.5 (GLOVE) ×3 IMPLANT
GLOVE BIOGEL PI IND STRL 8 (GLOVE) ×1 IMPLANT
GLOVE BIOGEL PI INDICATOR 8 (GLOVE) ×2
GOWN STRL REUS W/ TWL LRG LVL3 (GOWN DISPOSABLE) ×2 IMPLANT
GOWN STRL REUS W/ TWL XL LVL3 (GOWN DISPOSABLE) ×1 IMPLANT
GOWN STRL REUS W/TWL LRG LVL3 (GOWN DISPOSABLE) ×9
GOWN STRL REUS W/TWL XL LVL3 (GOWN DISPOSABLE) ×3
KIT BASIN OR (CUSTOM PROCEDURE TRAY) ×3 IMPLANT
KIT TURNOVER KIT B (KITS) ×3 IMPLANT
MANIFOLD NEPTUNE II (INSTRUMENTS) ×3 IMPLANT
NAIL CANN TFNA 9MM/130 DEG TI (Nail) ×2 IMPLANT
NS IRRIG 1000ML POUR BTL (IV SOLUTION) ×3 IMPLANT
PACK GENERAL/GYN (CUSTOM PROCEDURE TRAY) ×3 IMPLANT
PAD ARMBOARD 7.5X6 YLW CONV (MISCELLANEOUS) ×8 IMPLANT
SCREW FEMORAL CFNA 10.35X100MM (Screw) ×2 IMPLANT
SCREW LOCKING 5.0X32MM (Screw) ×2 IMPLANT
STAPLER VISISTAT 35W (STAPLE) ×3 IMPLANT
SUT VIC AB 2-0 CT1 27 (SUTURE) ×6
SUT VIC AB 2-0 CT1 TAPERPNT 27 (SUTURE) ×2 IMPLANT

## 2018-05-07 NOTE — Anesthesia Preprocedure Evaluation (Addendum)
Anesthesia Evaluation  Patient identified by MRN, date of birth, ID band Patient awake    Reviewed: Allergy & Precautions, H&P , NPO status , Patient's Chart, lab work & pertinent test results, reviewed documented beta blocker date and time   Airway Mallampati: II  TM Distance: >3 FB Neck ROM: Full    Dental no notable dental hx. (+) Poor Dentition, Dental Advisory Given   Pulmonary neg pulmonary ROS,    Pulmonary exam normal breath sounds clear to auscultation       Cardiovascular hypertension, Pt. on medications and Pt. on home beta blockers + CAD, + Past MI and + Cardiac Stents   Rhythm:Regular Rate:Normal     Neuro/Psych negative neurological ROS  negative psych ROS   GI/Hepatic Neg liver ROS, PUD, GERD  ,  Endo/Other  negative endocrine ROS  Renal/GU Renal InsufficiencyRenal disease  negative genitourinary   Musculoskeletal  (+) Arthritis , Osteoarthritis,    Abdominal   Peds  Hematology negative hematology ROS (+)   Anesthesia Other Findings   Reproductive/Obstetrics negative OB ROS                           Anesthesia Physical Anesthesia Plan  ASA: III  Anesthesia Plan: General   Post-op Pain Management:    Induction: Intravenous  PONV Risk Score and Plan: 3 and Ondansetron, Dexamethasone and Treatment may vary due to age or medical condition  Airway Management Planned: Oral ETT  Additional Equipment:   Intra-op Plan:   Post-operative Plan: Extubation in OR  Informed Consent: I have reviewed the patients History and Physical, chart, labs and discussed the procedure including the risks, benefits and alternatives for the proposed anesthesia with the patient or authorized representative who has indicated his/her understanding and acceptance.     Dental advisory given  Plan Discussed with: CRNA  Anesthesia Plan Comments:         Anesthesia Quick Evaluation

## 2018-05-07 NOTE — ED Provider Notes (Signed)
[facility] EMERGENCY DEPARTMENT Provider Note   CSN: [id number] Arrival date & time: 05/07/18  1110     History   Chief Complaint No chief complaint on file.   HPI Anthony Galvan is a 85 y.o. male.  85 yo M with a chief complaint of left hip pain.  The patient went to the bank and he lost his balance as he was getting out of the car and landed on his left side.  He is unable to ambulate after that.  Complaining of pain to the left groin area.  He fractured his right hip not that long ago thinks it feels somewhat similar.  Denies numbness or weakness to the leg.  The history is provided by the patient.  Injury  This is a new problem. The current episode started yesterday. The problem occurs constantly. Pertinent negatives include no chest pain, no abdominal pain, no headaches and no shortness of breath. The symptoms are aggravated by twisting, bending and walking. Nothing relieves the symptoms. He has tried nothing for the symptoms. The treatment provided no relief.    Past Medical History:  Diagnosis Date  . CAD S/P percutaneous coronary angioplasty 08/01/2012   PCI of mid LAD with a VeriFlex Bare Metal Stent 3.0 mm x 12 mm - post-dilated to 3.5 mm.;  Catheterization in July 2018 showed with severe heavily calcified distal circumflex-left PDA stenosis.  Not favorable for PCI.  . Duodenal ulcer May 2011   on EGD  . GERD (gastroesophageal reflux disease)   . Helicobacter pylori gastritis MAY 2012 HP STOOL AG NEG  . HTN (hypertension)   . Hyperlipidemia   . MI (myocardial infarction) ([place]) 1976, 1980, [place] for at [facility] and [place] (Dr. Iona Beard)  . Osteoarthritis   . S/P colonoscopy 2011   normal, internal hemorrhoids    Patient Active Problem List   Diagnosis Date Noted  . Fracture 05/07/2018  . Dyspepsia 01/18/2017  . Closed disp intertrochanteric fracture of right femur with nonunion 11/30/2016  . PUD (peptic ulcer disease)   .  History of coronary artery stent placement 10/08/2016  . Acute on chronic renal failure ([place]) 10/08/2016  . CKD (chronic kidney disease) stage 3, GFR 30-59 ml/min (HCC) 10/08/2016  . Bloating 09/03/2013  . CAD S/P percutaneous coronary angioplasty   . MI (myocardial infarction) ([place])   . Presence of bare metal stent in LAD coronary artery - VerifFlex BMS 3.0 mm x 12 mm - post-dilated to 3.5 mm 08/01/2012  . Unstable angina ([place]) 07/30/2012  . Gastritis 12/15/2010  . Nausea 08/30/2010  . Hyperlipidemia with target low density lipoprotein (LDL) cholesterol less than 70 mg/dL 05/21/2006  . CATARACT NOS 05/21/2006  . Essential hypertension 05/21/2006  . MYOCARDIAL INFARCTION, HX OF 05/21/2006  . GERD 05/21/2006  . OVERACTIVE BLADDER 05/21/2006  . OSTEOARTHRITIS 05/21/2006  . URINARY INCONTINENCE 05/21/2006    Past Surgical History:  Procedure Laterality Date  . CARDIAC CATHETERIZATION  01/30/2007   D1 75 %, mid LAD 50%, 50-70% circumflex, 50-70% RCA.(Dr. Adora Fridge)  . CARDIAC CATHETERIZATION  12/11/2001   normal L main, small RCA, dominant LL Cfx with mild diffuse disease, LAD with mid 10-20% stenosis, ramus intermedius with mild diffuse disease (Dr. Jackie Plum)  . CATARACT EXTRACTION    . COLONOSCOPY  OCT 2011 ARS   SML [place]   in setting of MI  . ESOPHAGOGASTRODUODENOSCOPY N/A 10/09/2016   Procedure:  ESOPHAGOGASTRODUODENOSCOPY (EGD);  Surgeon: Danie Binder, MD;  [facility];  Service: Endoscopy;  Laterality: N/A;  . FEMUR IM NAIL Right 12/01/2016   Procedure: INTRAMEDULLARY (IM) NAIL RIGHT HIP;  Surgeon: Altamese Arapahoe, MD;  Location: [facility];  Service: Orthopedics;  Laterality: Right;  . LEFT HEART CATH AND CORONARY ANGIOGRAPHY N/A 10/23/2016   Procedure: Left Heart Cath and Coronary Angiography;  Surgeon: Sherren Mocha, MD;  Location: [facility] CV LAB;  Service: Cardiovascular: Two-vessel CAD (left dominant).  Patent BMS in p LAD.  Severe,  heavily calcified dCx-LPDA lesion.  Not favorable for PCI.  Marland Kitchen LEFT HEART CATHETERIZATION WITH CORONARY ANGIOGRAM N/A 08/01/2012   Procedure: LEFT HEART CATHETERIZATION WITH CORONARY ANGIOGRAM;  Surgeon: Leonie Man, MD;  Location[facility] CATH LAB: Mid LAD 99% apple core; D2 ostial 70-80% (2 small for PCI) small nondominant RCA 60-70%. Distal circumflex/L PDA ~50% --> PCI of LAD  . NM MYOCAR PERF WALL MOTION  08/2003   adenosine stress - focal decreased perfusion defect in distal inferior wall, no significant ischemic changes  . PERCUTANEOUS STENT INTERVENTION  08/01/2012   Procedure: PERCUTANEOUS STENT INTERVENTION;  Surgeon: Leonie Man, MD;  Location: [facility] CATH LAB; ;PCI of mid LAD with a VeriFlex Bare Metal Stent 3.0 mm x 12 mm - post-dilated to 3.5 mm.  . TRANSTHORACIC ECHOCARDIOGRAM  10/2016   mild LVH - 60-65%. Gr 1 DD. No RWMA. Mild MR. Mod TR. Mild RA dilation.  PAP ~ 60 mmHg.  Marland Kitchen UPPER GASTROINTESTINAL ENDOSCOPY  08/2009   MELENA, HEMATEMESIS --> DUODENAL ULCER, Bx: H PYLORI POS        Home Medications    Prior to Admission medications   Medication Sig Start Date End Date Taking? Authorizing Provider  acetaminophen (TYLENOL) 325 MG tablet Take 2 tablets (650 mg total) by mouth 3 (three) times daily as needed (pain). Patient taking differently: Take 325-975 mg by mouth 3 (three) times daily as needed (for pain or headaches).  12/04/16  Yes Ainsley Spinner, PA-C  atorvastatin (LIPITOR) 80 MG tablet Take 1 tablet (80 mg total) by mouth daily at 6 PM. 08/02/12   Brett Canales, PA-C  cetirizine (ZYRTEC) 5 MG tablet Take 10 mg by mouth daily.     [provider]  hydrALAZINE (APRESOLINE) 10 MG tablet Take 10 mg 2 (two) times daily by mouth.    [provider]  hydrochlorothiazide (HYDRODIURIL) 25 MG tablet Take 25 mg daily by mouth.    [provider]  isosorbide dinitrate (ISORDIL) 30 MG tablet Take 30 mg 4 (four) times daily by mouth.    [provider]    metoprolol (LOPRESSOR) 50 MG tablet Take 50 mg by mouth 2 (two) times daily.    [provider]  Multiple Vitamin (MULTIVITAMIN WITH MINERALS) TABS tablet Take 1 tablet by mouth daily.    [provider]  NITROSTAT 0.4 MG SL tablet PLACE 1 TABLET UNDER TONGUE EVERY 5 MINUTES FOR 3 DOSES AS NEEDED. Patient taking differently: Place 0.4 mg under the tongue every 5 (five) minutes x 3 doses as needed for chest pain.  11/17/13   Leonie Man, MD  valsartan (DIOVAN) 320 MG tablet Take 320 mg daily by mouth.    [provider]    Family History Family History  Problem Relation Age of Onset  . Heart disease Mother   . Kidney disease Father   . Heart disease Brother 16  . Heart disease Sister   .  Colon cancer Neg Hx     Social History Social History   Tobacco Use  . Smoking status: Never Smoker  . Smokeless tobacco: Never Used  Substance Use Topics  . Alcohol use: No  . Drug use: No     Allergies   Lisinopril   Review of Systems Review of Systems  Constitutional: Negative for chills and fever.  HENT: Negative for congestion and facial swelling.   Eyes: Negative for discharge and visual disturbance.  Respiratory: Negative for shortness of breath.   Cardiovascular: Negative for chest pain and palpitations.  Gastrointestinal: Negative for abdominal pain, diarrhea and vomiting.  Musculoskeletal: Positive for arthralgias, gait problem and myalgias.  Skin: Negative for color change and rash.  Neurological: Negative for tremors, syncope and headaches.  Psychiatric/Behavioral: Negative for confusion and dysphoric mood.     Physical Exam Updated Vital Signs BP 120/68 (BP Location: Right Arm)   Pulse 77   Temp 98 F (36.7 C) (Oral)   Resp 16   Ht 5\' 3"  (1.6 m)   Wt 77.1 kg   SpO2 96%   BMI 30.11 kg/m   Physical Exam Vitals signs and nursing note reviewed.  Constitutional:      Appearance: He is well-developed.  HENT:     Head:  Normocephalic and atraumatic.  Eyes:     Pupils: Pupils are equal, round, and reactive to light.  Neck:     Musculoskeletal: Normal range of motion and neck supple.     Vascular: No JVD.  Cardiovascular:     Rate and Rhythm: Normal rate and regular rhythm.     Heart sounds: No murmur. No friction rub. No gallop.   Pulmonary:     Effort: No respiratory distress.     Breath sounds: No wheezing.  Abdominal:     General: There is no distension.     Tenderness: There is no abdominal tenderness. There is no guarding or rebound.  Musculoskeletal: Normal range of motion.        General: Tenderness present.     Comments: Left lower extremity is shortened and externally rotated.  Pulse motor and sensation is intact distally.  Pain with internal and external rotation of the hip.  No pain with palpation of the pelvic rim.  Skin:    Coloration: Skin is not pale.     Findings: No rash.  Neurological:     Mental Status: He is alert and oriented to person, place, and time.  Psychiatric:        Behavior: Behavior normal.      ED Treatments / Results  Labs (all labs ordered are listed, but only abnormal results are displayed) Labs Reviewed  CBC WITH DIFFERENTIAL/PLATELET - Abnormal; Notable for the following components:      Result Value   WBC 10.6 (*)    RDW 11.4 (*)    Platelets 133 (*)    All other components within normal limits  BASIC METABOLIC PANEL - Abnormal; Notable for the following components:   Creatinine, Ser 1.41 (*)    GFR calc non Af Amer 42 (*)    GFR calc Af Amer 49 (*)    All other components within normal limits    EKG None  Radiology Dg Chest 2 View  Result Date: 05/07/2018 CLINICAL DATA:  Fall and LEFT hip fracture. EXAM: CHEST - 2 VIEW COMPARISON:  11/30/2016 and prior chest radiographs FINDINGS: UPPER limits normal heart size noted. Chronic interstitial prominence and scarring bilaterally again noted. No  new pulmonary abnormalities are identified. There is no  evidence of pneumothorax or definite pleural effusion. No acute bony abnormalities are identified. IMPRESSION: 1. No evidence of acute abnormality. 2. Chronic interstitial prominence and pulmonary scarring. Electronically Signed   By: Margarette Canada M.D.   On: 05/07/2018 12:34   Dg Hip Unilat W Or Wo Pelvis 2-3 Views Left  Result Date: 05/07/2018 CLINICAL DATA:  Fall EXAM: DG HIP (WITH OR WITHOUT PELVIS) 2-3V LEFT COMPARISON:  06/30/2016 FINDINGS: There is a minimally displaced fracture through the intertrochanteric region of the left femur. There is cortical step-off just proximal to the lesser trochanter. An intramedullary rod and dynamic compression screw are present in the proximal right femur. Osteopenia. Mild degenerative change of the SI and hip joints. No breakage or loosening of the hardware. IMPRESSION: Acute minimally displaced left intertrochanteric femur fracture. Electronically Signed   By: Marybelle Killings M.D.   On: 05/07/2018 12:33    Procedures Procedures (including critical care time)  Medications Ordered in ED Medications  acetaminophen (TYLENOL) tablet 1,000 mg (1,000 mg Oral Given 05/07/18 1205)  morphine 2 MG/ML injection 2 mg (2 mg Intravenous Given 05/07/18 1206)     Initial Impression / Assessment and Plan / ED Course  I have reviewed the triage vital signs and the nursing notes.  Pertinent labs & imaging results that were available during my care of the patient were reviewed by me and considered in my medical decision making (see chart for details).     85 yo M with a chief complaint of left hip pain.  Plain films viewed by me positive for an intertrochanteric hip fracture.  Will discuss with orthopedics.  Discussed the case with Hilbert Odor, covering for Dr. Marcelino Scot, will come and evaluate the patient at bedside.  Will discuss with medicine for admission. The patients results and plan were reviewed and discussed.   Any x-rays performed were independently reviewed by  myself.   Differential diagnosis were considered with the presenting HPI.  Medications  acetaminophen (TYLENOL) tablet 1,000 mg (1,000 mg Oral Given 05/07/18 1205)  morphine 2 MG/ML injection 2 mg (2 mg Intravenous Given 05/07/18 1206)    Vitals:   05/07/18 1124 05/07/18 1129 05/07/18 1130 05/07/18 1356  BP:  (!) 192/99 (!) 190/90 120/68  Pulse:  85 77   Resp:  18  16  Temp:  98 F (36.7 C)    TempSrc:  Oral    SpO2:  94% 96%   Weight: 77.1 kg     Height: 5\' 3"  (1.6 m)       Final diagnoses:  Closed displaced intertrochanteric fracture of left femur, initial encounter [facility])    Admission/ observation were discussed with the admitting physician, patient and/or family and they are comfortable with the plan.    Final Clinical Impressions(s) / ED Diagnoses   Final diagnoses:  Closed displaced intertrochanteric fracture of left femur, initial encounter [facility])    ED Discharge Orders    None       Deno Etienne, DO 05/07/18 1501

## 2018-05-07 NOTE — Transfer of Care (Signed)
Immediate Anesthesia Transfer of Care Note  Patient: Anthony Galvan  Procedure(s) Performed: INTRAMEDULLARY (IM) NAIL FEMORAL (Left Leg Upper)  Patient Location: PACU  Anesthesia Type:General  Level of Consciousness: sedated, drowsy, patient cooperative and responds to stimulation  Airway & Oxygen Therapy: Patient Spontanous Breathing and Patient connected to nasal cannula oxygen  Post-op Assessment: Report given to RN, Post -op Vital signs reviewed and stable and Patient moving all extremities X 4  Post vital signs: Reviewed and stable  Last Vitals:  Vitals Value Taken Time  BP 181/87 05/07/2018  7:11 PM  Temp    Pulse 90 05/07/2018  7:15 PM  Resp 14 05/07/2018  7:14 PM  SpO2 98 % 05/07/2018  7:15 PM  Vitals shown include unvalidated device data.  Last Pain:  Vitals:   05/07/18 1653  TempSrc:   PainSc: 7          Complications: No apparent anesthesia complications

## 2018-05-07 NOTE — ED Triage Notes (Signed)
Pt presents to ED from home. Pt was at the bank and getting out of the car. Pt fell and thinks he broke his left leg. Pt is on blood thinners.

## 2018-05-07 NOTE — Consult Note (Signed)
Reason for Consult:Left hip fx Referring Physician: D Breyson Kelm is an 85 y.o. male.  HPI: Anthony Galvan was going into the bank this morning when he lost his balance and fell onto his left side. He had immediate pain and could not get up. He was brought to the ED where x-rays showed a left intertroch fx and orthopedic surgery was consulted. He has localized pain to the hip but no other c/o.  Past Medical History:  Diagnosis Date  . CAD S/P percutaneous coronary angioplasty 08/01/2012   PCI of mid LAD with a VeriFlex Bare Metal Stent 3.0 mm x 12 mm - post-dilated to 3.5 mm.;  Catheterization in July 2018 showed with severe heavily calcified distal circumflex-left PDA stenosis.  Not favorable for PCI.  . Duodenal ulcer May 2011   on EGD  . GERD (gastroesophageal reflux disease)   . Helicobacter pylori gastritis MAY 2012 HP STOOL AG NEG  . HTN (hypertension)   . Hyperlipidemia   . MI (myocardial infarction) ([place]) 1976, 1980, [place] for at [facility] and [place] (Dr. Iona Beard)  . Osteoarthritis   . S/P colonoscopy 2011   normal, internal hemorrhoids    Past Surgical History:  Procedure Laterality Date  . CARDIAC CATHETERIZATION  01/30/2007   D1 75 %, mid LAD 50%, 50-70% circumflex, 50-70% RCA.(Dr. Adora Fridge)  . CARDIAC CATHETERIZATION  12/11/2001   normal L main, small RCA, dominant LL Cfx with mild diffuse disease, LAD with mid 10-20% stenosis, ramus intermedius with mild diffuse disease (Dr. Jackie Plum)  . CATARACT EXTRACTION    . COLONOSCOPY  OCT 2011 ARS   SML [place]   in setting of MI  . ESOPHAGOGASTRODUODENOSCOPY N/A 10/09/2016   Procedure: ESOPHAGOGASTRODUODENOSCOPY (EGD);  Surgeon: Danie Binder, MD;  [facility];  Service: Endoscopy;  Laterality: N/A;  . FEMUR IM NAIL Right 12/01/2016   Procedure: INTRAMEDULLARY (IM) NAIL RIGHT HIP;  Surgeon: Altamese Malta, MD;  Location: [place];  Service: Orthopedics;  Laterality:  Right;  . LEFT HEART CATH AND CORONARY ANGIOGRAPHY N/A 10/23/2016   Procedure: Left Heart Cath and Coronary Angiography;  Surgeon: Sherren Mocha, MD;  Location: [place] CV LAB;  Service: Cardiovascular: Two-vessel CAD (left dominant).  Patent BMS in p LAD.  Severe, heavily calcified dCx-LPDA lesion.  Not favorable for PCI.  Marland Kitchen LEFT HEART CATHETERIZATION WITH CORONARY ANGIOGRAM N/A 08/01/2012   Procedure: LEFT HEART CATHETERIZATION WITH CORONARY ANGIOGRAM;  Surgeon: Leonie Man, MD;  Location: [facility] CATH LAB: Mid LAD 99% apple core; D2 ostial 70-80% (2 small for PCI) small nondominant RCA 60-70%. Distal circumflex/L PDA ~50% --> PCI of LAD  . NM MYOCAR PERF WALL MOTION  08/2003   adenosine stress - focal decreased perfusion defect in distal inferior wall, no significant ischemic changes  . PERCUTANEOUS STENT INTERVENTION  08/01/2012   Procedure: PERCUTANEOUS STENT INTERVENTION;  Surgeon: Leonie Man, MD;  Location: [facility] CATH LAB; ;PCI of mid LAD with a VeriFlex Bare Metal Stent 3.0 mm x 12 mm - post-dilated to 3.5 mm.  . TRANSTHORACIC ECHOCARDIOGRAM  10/2016   mild LVH - 60-65%. Gr 1 DD. No RWMA. Mild MR. Mod TR. Mild RA dilation.  PAP ~ 60 mmHg.  Marland Kitchen UPPER GASTROINTESTINAL ENDOSCOPY  08/2009   MELENA, HEMATEMESIS --> DUODENAL ULCER, Bx: H PYLORI POS    Family History  Problem Relation Age of Onset  . Heart disease Mother   .  Kidney disease Father   . Heart disease Brother 48  . Heart disease Sister   . Colon cancer Neg Hx     Social History:  reports that he has never smoked. He has never used smokeless tobacco. He reports that he does not drink alcohol or use drugs.  Allergies: No Known Allergies  Medications: I have reviewed the patient's current medications.  Results for orders placed or performed during the hospital encounter of 05/07/18 (from the past 48 hour(s))  CBC with Differential     Status: Abnormal   Collection Time: 05/07/18 11:53 AM  Result Value Ref Range   WBC 10.6  (H) 4.0 - 10.5 K/uL   RBC 5.09 4.22 - 5.81 MIL/uL   Hemoglobin 14.2 13.0 - 17.0 g/dL   HCT 47.2 39.0 - 52.0 %   MCV 92.7 80.0 - 100.0 fL   MCH 27.9 26.0 - 34.0 pg   MCHC 30.1 30.0 - 36.0 g/dL   RDW 11.4 (L) 11.5 - 15.5 %   Platelets 133 (L) 150 - 400 K/uL   nRBC 0.0 0.0 - 0.2 %   Neutrophils Relative % 73 %   Neutro Abs 7.7 1.7 - 7.7 K/uL   Lymphocytes Relative 23 %   Lymphs Abs 2.4 0.7 - 4.0 K/uL   Monocytes Relative 3 %   Monocytes Absolute 0.3 0.1 - 1.0 K/uL   Eosinophils Relative 0 %   Eosinophils Absolute 0.0 0.0 - 0.5 K/uL   Basophils Relative 1 %   Basophils Absolute 0.1 0.0 - 0.1 K/uL   nRBC 0 0 /100 WBC   Abs Immature Granulocytes 0.00 0.00 - 0.07 K/uL    Comment: Performed at [facility], [address] [place], [place] [address]  Basic metabolic panel     Status: Abnormal   Collection Time: 05/07/18 11:53 AM  Result Value Ref Range   Sodium 138 135 - 145 mmol/L   Potassium 4.0 3.5 - 5.1 mmol/L   Chloride 101 98 - 111 mmol/L   CO2 25 22 - 32 mmol/L   Glucose, Bld 99 70 - 99 mg/dL   BUN 19 8 - 23 mg/dL   Creatinine, Ser 1.41 (H) 0.61 - 1.24 mg/dL   Calcium 9.4 8.9 - 10.3 mg/dL   GFR calc non Af Amer 42 (L) >60 mL/min   GFR calc Af Amer 49 (L) >60 mL/min   Anion gap 12 5 - 15    Comment: Performed at [facility], [place] [address] [place], [place] [address]    Dg Chest 2 View  Result Date: 05/07/2018 CLINICAL DATA:  Fall and LEFT hip fracture. EXAM: CHEST - 2 VIEW COMPARISON:  11/30/2016 and prior chest radiographs FINDINGS: UPPER limits normal heart size noted. Chronic interstitial prominence and scarring bilaterally again noted. No new pulmonary abnormalities are identified. There is no evidence of pneumothorax or definite pleural effusion. No acute bony abnormalities are identified. IMPRESSION: 1. No evidence of acute abnormality. 2. Chronic interstitial prominence and pulmonary scarring. Electronically Signed   By: Margarette Canada M.D.   On: 05/07/2018  12:34   Dg Hip Unilat W Or Wo Pelvis 2-3 Views Left  Result Date: 05/07/2018 CLINICAL DATA:  Fall EXAM: DG HIP (WITH OR WITHOUT PELVIS) 2-3V LEFT COMPARISON:  06/30/2016 FINDINGS: There is a minimally displaced fracture through the intertrochanteric region of the left femur. There is cortical step-off just proximal to the lesser trochanter. An intramedullary rod and dynamic compression screw are present in the  proximal right femur. Osteopenia. Mild degenerative change of the SI and hip joints. No breakage or loosening of the hardware. IMPRESSION: Acute minimally displaced left intertrochanteric femur fracture. Electronically Signed   By: Marybelle Killings M.D.   On: 05/07/2018 12:33    ROS Blood pressure (!) 190/90, pulse 77, temperature 98 F (36.7 C), temperature source Oral, resp. rate 18, height 5\' 3"  (1.6 m), weight 77.1 kg, SpO2 96 %. Physical Exam  Constitutional: He appears well-developed and well-nourished. No distress.  HENT:  Head: Normocephalic and atraumatic.  Eyes: Conjunctivae are normal. Right eye exhibits no discharge. Left eye exhibits no discharge. No scleral icterus.  Neck: Normal range of motion.  Cardiovascular: Normal rate and regular rhythm.  Respiratory: Effort normal. No respiratory distress.  Musculoskeletal:     Comments: LLE No traumatic wounds, ecchymosis, or rash  TTP hip, leg shortened and externally rotated  No knee or ankle effusion  Knee stable to varus/ valgus and anterior/posterior stress  Sens DPN, SPN, TN intact  Motor EHL, ext, flex, evers 5/5  DP 0, PT 0, No significant edema  Neurological: He is alert.  Skin: Skin is warm and dry. He is not diaphoretic.  Psychiatric: He has a normal mood and affect. His behavior is normal.    Assessment/Plan: Left hip fx -- If medicine can clear him will plan on fixation tonight by Dr. Stann Mainland. NPO until then. Multiple medical problems including CAD, HLD, AR, and HTN -- per primary team    Anthony Abu,  PA-C Orthopedic Surgery [phone] 05/07/2018, 1:55 PM

## 2018-05-07 NOTE — Brief Op Note (Signed)
05/07/2018  7:03 PM  PATIENT:  Anthony Galvan  85 y.o. male  PRE-OPERATIVE DIAGNOSIS:  Left hip fx  POST-OPERATIVE DIAGNOSIS:  Left hip fx  PROCEDURE:  Procedure(s): INTRAMEDULLARY (IM) NAIL FEMORAL (Left)  SURGEON:  Surgeon(s) and Role:    * Nicholes Stairs, MD - Primary  PHYSICIAN ASSISTANT:   ASSISTANTS: none   ANESTHESIA:   none  EBL:  200 mL   BLOOD ADMINISTERED:none  DRAINS: none   LOCAL MEDICATIONS USED:  NONE  SPECIMEN:  No Specimen  DISPOSITION OF SPECIMEN:  N/A  COUNTS:  YES  TOURNIQUET:  * No tourniquets in log *  DICTATION: .Note written in EPIC  PLAN OF CARE: Admit to inpatient   PATIENT DISPOSITION:  PACU - hemodynamically stable.   Delay start of Pharmacological VTE agent (>24hrs) due to surgical blood loss or risk of bleeding: not applicable

## 2018-05-07 NOTE — Anesthesia Procedure Notes (Signed)
Procedure Name: Intubation Date/Time: 05/07/2018 6:00 PM Performed by: Cleda Daub, CRNA Pre-anesthesia Checklist: Patient identified, Emergency Drugs available, Suction available and Patient being monitored Patient Re-evaluated:Patient Re-evaluated prior to induction Oxygen Delivery Method: Circle system utilized Preoxygenation: Pre-oxygenation with 100% oxygen Induction Type: IV induction Ventilation: Mask ventilation without difficulty and Mask ventilation throughout procedure Laryngoscope Size: Mac and 3 Grade View: Grade I Tube type: Oral Tube size: 7.5 mm Number of attempts: 1 Airway Equipment and Method: Stylet Placement Confirmation: ETT inserted through vocal cords under direct vision,  positive ETCO2 and breath sounds checked- equal and bilateral Secured at: 21 cm Tube secured with: Tape Dental Injury: Teeth and Oropharynx as per pre-operative assessment

## 2018-05-07 NOTE — Op Note (Signed)
Date of Surgery: 05/07/2018  INDICATIONS: Anthony Galvan is a 85 y.o.-year-old male who sustained a left hip fracture. The risks and benefits of the procedure discussed with the patient prior to the procedure and all questions were answered; consent was obtained.  PREOPERATIVE DIAGNOSIS: left hip fracture   POSTOPERATIVE DIAGNOSIS: Same   PROCEDURE: Treatment of intertrochanteric, pertrochanteric, subtrochanteric fracture with intramedullary implant. CPT [phone]   SURGEON: Dannielle Karvonen. Stann Mainland, M.D.   ANESTHESIA: general   IV FLUIDS AND URINE: See anesthesia record   ESTIMATED BLOOD LOSS: 200 cc  IMPLANTS:   Synthes 9 mm x 185 mm short TFN A 100 mm proximal compressino screw 32 mm x 5.0 mm distal interlock screw  DRAINS: None.   COMPLICATIONS: None.   DESCRIPTION OF PROCEDURE: The patient was brought to the operating room and placed supine on the operating table. The patient's leg had been signed prior to the procedure. The patient had the anesthesia placed by the anesthesiologist. The prep verification and incision time-outs were performed to confirm that this was the correct patient, site, side and location. The patient had an SCD on the opposite lower extremity. The patient did receive antibiotics prior to the incision and was re-dosed during the procedure as needed at indicated intervals. The patient was positioned on the fracture table with the table in traction and internal rotation to reduce the hip. The well leg was placed in a scissor position and all bony prominences were well-padded. The patient had the lower extremity prepped and draped in the standard surgical fashion. The incision was made 4 finger breadths superior to the greater trochanter. A guide pin was inserted into the tip of the greater trochanter under fluoroscopic guidance. An opening reamer was used to gain access to the femoral canal. The nail length was measured and inserted down the femoral canal to its proper depth.  The appropriate version of insertion for the lag screw was found under fluoroscopy. A pin was inserted up the femoral neck through the jig. The length of the lag screw was then measured. The lag screw was inserted as near to center-center in the head as possible. The leg was taken out of traction, then the compression screw was used to compress across the fracture. Compression was visualized on serial xrays.   We next turned our attention to the distal interlocking screw.  This was placed through the drill guide of the nail inserter.  A small incision was made overlying the lateral thigh at the screw site, and a tonsil was used to disect down to bone.  A drill pass was made through the jig and across the nail through both cortices.  This was measured, and the appropriate screw was placed under hand power and found to have good bite.    The wound was copiously irrigated with saline and the subcutaneous layer closed with 2.0 vicryl and the skin was reapproximated with staples. The wounds were cleaned and dried a final time and a sterile dressing was placed. The hip was taken through a range of motion at the end of the case under fluoroscopic imaging to visualize the approach-withdraw phenomenon and confirm implant length in the head. The patient was then awakened from anesthesia and taken to the recovery room in stable condition. All counts were correct at the end of the case.   POSTOPERATIVE PLAN: The patient will be weight bearing as tolerated and will return in 2 weeks for staple removal and the patient will receive DVT prophylaxis  based on other medications, activity level, and risk ratio of bleeding to thrombosis.    He will be fine to continue his Plavix for post operative DVT ppx.     Anthony Rile, MD [facility] [place]. [phone] 7:04 PM

## 2018-05-07 NOTE — Discharge Summary (Addendum)
[facility] Discharge Summary  Patient name: Anthony Galvan Medical record number: [id number] Date of birth: 1922-01-19 Age: 85 y.o. Gender: male Date of Admission: 05/07/2018  Date of Discharge: 05/13/2018 Admitting Physician: Zenia Resides, MD  Primary Care Provider: [name], MD Consultants: ortho surgery  Indication for Hospitalization: L hip fracture  Discharge Diagnoses/Problem List:  L hip fracture s/p internal fixation  Disposition: discharge to SNF  Discharge Condition: improved, stable  Discharge Exam:  General: well nourished, well developed, in no acute distress with non-toxic appearance, lying comfortably in bed HEENT: normocephalic, atraumatic, moist mucous membranes CV: regular rate and rhythm without murmurs, rubs, or gallops, no lower extremity edema, 2+ radial and pedal pulses bilaterally Lungs: clear to auscultation bilaterally with normal work of breathing Abdomen: soft, non-tender, non-distended, normoactive bowel sounds Skin: warm, dry, no rashes or lesions Extremities: warm and well perfused, normal tone MSK: non-tender to palpation Neuro: Alert and oriented, speech normal  Brief Hospital Course:  Patient presented after mechanical fall and sustained L hip fracture while on Plavix. He was otherwise uninjured and stable. He received internal fixation on night of admission and tolerated the surgery well. He required a low amount of oxygen for the two days following surgery but was stable on room air for remainder of stay. His pain was well controlled and he was able to tolerate gentle PT POD#1. His hemoglobin remained stable at 11. He was on SCDs for DVT ppx and daily ASA 325mg  was started to be continued for a duration of 6 weeks post-op. PT recommended SNF placement for further recovery.   Vitamin D deficiency- vitamin D 10.5 so was started on weekly vitamin D supplement and daily calcium supplement to be continued after  discharge.   HTN: Patient came in on home medications that included Hydralazine, HCTZ, Isosorbide dinitrite, metoprolol and Valsartan. Patient's blood pressures remained soft throughout admission. He was discharged on Metoprolol 12.5mg  BID and his home isosorbide dinitrate 30mg  QD.  Issues for Follow Up:  1. Continue PT at SNF 2. Follow up with orthosurgery in 2-3 weeks for wound check and staple removal. 3. Continue ASA 325mg  daily for 6 weeks  4. Continue to supplement vitamin D (50,000 units weekly x8 weeks- first dose 1/23) 5. Recommend a DEXA scan outpatient 6. Consider initiating bisphosphonate in ~8 weeks 7. Continue to follow blood pressures and restart home meds as indicated  Significant Procedures: internal fixation of left femur fracture 1/21  Significant Labs and Imaging:  Recent Labs  Lab 05/11/18 0523 05/12/18 0640 05/13/18 0420  WBC 10.9* 9.9 9.5  HGB 11.3* 11.6* 11.5*  HCT 35.4* 37.7* 37.7*  PLT 112* 133* 138*   Recent Labs  Lab 05/07/18 1153 05/08/18 0140 05/10/18 1033 05/13/18 0420  NA 138 135 137 135  K 4.0 4.4 4.1 4.7  CL 101 100 102 98  CO2 25 24 27 26   GLUCOSE 99 105* 119* 98  BUN 19 18 28* 29*  CREATININE 1.41* 1.30* 1.64* 1.34*  CALCIUM 9.4 8.7* 8.6* 8.6*   Vitamin D 10.5  Results/Tests Pending at Time of Discharge: none  Discharge Medications:  Allergies as of 05/13/2018      Reactions   Lisinopril Other (See Comments)   "Allergic," per the [facility]- patient is unaware of any reaction (??)      Medication List    STOP taking these medications   cetirizine 5 MG tablet Commonly known as:  ZYRTEC   clopidogrel 75 MG tablet Commonly  known as:  PLAVIX   hydrALAZINE 10 MG tablet Commonly known as:  APRESOLINE   hydrochlorothiazide 25 MG tablet Commonly known as:  HYDRODIURIL   valsartan 320 MG tablet Commonly known as:  DIOVAN     TAKE these medications   acetaminophen 325 MG tablet Commonly known as:  TYLENOL Take 2 tablets (650  mg total) by mouth 3 (three) times daily as needed (pain). What changed:    how much to take  reasons to take this   aspirin 325 MG tablet Take 1 tablet (325 mg total) by mouth daily.   atorvastatin 80 MG tablet Commonly known as:  LIPITOR Take 1 tablet (80 mg total) by mouth daily at 6 PM. What changed:  when to take this   calcium carbonate 1250 (500 Ca) MG tablet Commonly known as:  OS-CAL - dosed in mg of elemental calcium Take 1 tablet (500 mg of elemental calcium total) by mouth daily with breakfast.   isosorbide mononitrate 30 MG 24 hr tablet Commonly known as:  IMDUR Take 30 mg by mouth daily.   metoprolol tartrate 25 MG tablet Commonly known as:  LOPRESSOR Take 0.5 tablets (12.5 mg total) by mouth 2 (two) times daily. What changed:    medication strength  how much to take   multivitamin with minerals Tabs tablet Take 1 tablet by mouth daily.   NITROSTAT 0.4 MG SL tablet Generic drug:  nitroGLYCERIN PLACE 1 TABLET UNDER TONGUE EVERY 5 MINUTES FOR 3 DOSES AS NEEDED. What changed:  See the new instructions.   polyethylene glycol packet Commonly known as:  MIRALAX / GLYCOLAX Take 17 g by mouth 2 (two) times daily.   Vitamin D (Ergocalciferol) 1.25 MG (50000 UT) Caps capsule Commonly known as:  DRISDOL Take 1 capsule (50,000 Units total) by mouth every 7 (seven) days. Start taking on:  May 16, 2018       Discharge Instructions: Please refer to Patient Instructions section of EMR for full details.  Patient was counseled important signs and symptoms that should prompt return to medical care, changes in medications, dietary instructions, activity restrictions, and follow up appointments.   Follow-Up Appointments: Follow-up Information    Nicholes Stairs, MD In 2 weeks.   Specialty:  Orthopedic Surgery Why:  For suture removal, For wound re-check Contact information: [address] [place] [address] [phone]            Danna Hefty, DO 05/13/2018, 2:11 PM PGY-1, [place]

## 2018-05-07 NOTE — H&P (Addendum)
[facility] Admission History and Physical Service Pager: [phone]  Patient name: Anthony Galvan Medical record number: [id number] Date of birth: 05-Jul-1921 Age: 85 y.o. Gender: male  Primary Care Provider: [name], MD Consultants: ortho Code Status: full  Chief Complaint: left hip fracture  Assessment and Plan: Anthony Galvan is a 85 y.o. male presenting with minimally-displaced L hip fracture . PMH is significant for CAD, GERD, HTN, HLD, h/o MI, osteoarthritis  L hip fracture- h/o R hip fracture 11/2017 and completed PT- walks with cane at baseline. Lives at home with grandson. Patient is on Plavix but states his last time taking it was yesterday. There are notes from cardiology (11/20) recommending to stop taking Plavix. Vitals on admission: stable other than elevated BP to 192/99 which improved to 120/68 without intervention. Pain is well controlled per patient. Imaging: hip/pelvis xray minimally displaced left intertrochanteric femur fracture. Chest xray no evidence of acute abnormality, chronic interstitial prominence and pulmonary scarring. Labs: hgb 14.2 on admission. EKG- NSR, no acute ischemic changes from previous.  Physical exam: fully alert, patient resting comfortably in bed. Able to move right hip without pain. left hip not able to move or bear weight but is able to wiggle foot and toes. He has full sensation intact bilaterally. He has equal pedal pulses bilaterally and appear to be well perfused. There is no obvious deformity of his left hip. Negative for tenderness to palpation. Rest of exam is negative for lesions from fall. In the ED: he received tylenol, 2mg  morphine and his pain was well controlled at rest. Etiology: mechanical fall. Less likely stroke given normal neuro exam. Unlikely seizure- no post-ictal state.  Unlikely arrhythmia given no LOC, chest pain, SOB, no obvious ischemic changes on EKG. [place] Safe Perioperative Risk for Myocardial  Infarction or Cardiac Arrest (MICA) score is 0.5%. Per orthopedic note, who saw patient in ED, patient to have fixation on 1/21 by Dr. Stann Mainland. Keep patient NPO.  -Admit to med-surg, attending Dr. Andria Frames Meds: - hold plavix, can consider discontinuing on discharge given last cardiology note recommendations - scheduled tylenol - morphine 1mg  q4hrs PRN IV while NPO - 100cc/hr 1/2 NS maintenance IV fluids - pre-operative ancef Labs: - CBC am - vitamin D level Other: - vitals per floor protocol - fall precautions - SCDs for ppx Consults: - ortho following  HTN/CAD/h/o MI- follows with Dr. Johny Chess, cardiology. Blood pressure elevated with systolics in 147W on admission. Patient states he did not take any of his medications today at home. His BP corrected to 120s without intervention. Likely elevated 2/2 pain. Will consider continuing more home meds as needed. Home meds: hydralazine 10mg  BID, HCTZ 25mg  daily, isosorbide dinitrate 30mg  TID, metoprolol 50mg  BID - hold home hydral and HCTZ - IV metoprolol while patient is NPO for surgery, then - continue home metoprolol and isosorbide - hydralazine PRN for SBP >190, DBP>110  HLD- Home meds: atorvastatin 80mg  - continue home statin  GOC- patient initially stated that he did not want any resuscitative measures because he saw his mom go through that against her wishes. He then stated that he would like to have chest compressions and intubation in the case of cardiac/respiratory failure but he does not want any medications or cardioversion to prolong his life. He does not have previous documentation for this decision. He states that his family is on the way to the hospital and we encouraged him to express his wishes to his family members as well as well  as complete official documentation when able.   FEN/GI: 100cc/hr maintenance 1/2NS Prophylaxis: SCDs  Disposition: discharge pending improvement of hip fracture with surgery and PT  History of  Present Illness:  Anthony Galvan is a 85 y.o. male presenting with L mildly-displaces femur fracture s/p mechanical fall.   Patient was exiting vehicle and walking away with a cane when he started to lean to left and couldn't stop himself from falling to pavement. He denies any lightheadedness, dizziness, chest pain, SOB, LOC. Denies hitting his head or receiving any other injuries from the fall. He was unable to stand from that point so his friend lifted him into the car and brought him to the ED. Denies excessive pain from accident. He did not take any of his medications this morning and has not eaten since yesterday evening.  Recent h/o L hip fracture in 2018. Healed up well and had PT after dc.   In ED, patient states he feels well. Pain is "between hurting and not hurting". Reports he is unable to move leg at hip.   Review Of Systems: Per HPI with the following additions:   Review of Systems  HENT: Positive for hearing loss and tinnitus. Negative for ear pain.   Respiratory: Negative for shortness of breath.   Cardiovascular: Negative for chest pain.  Musculoskeletal: Positive for falls and joint pain.  Neurological: Positive for weakness. Negative for dizziness, sensory change, focal weakness, seizures, loss of consciousness and headaches.  Psychiatric/Behavioral: Negative for memory loss.    Patient Active Problem List   Diagnosis Date Noted  . Dyspepsia 01/18/2017  . Closed disp intertrochanteric fracture of right femur with nonunion 11/30/2016  . PUD (peptic ulcer disease)   . History of coronary artery stent placement 10/08/2016  . Acute on chronic renal failure ([place]) 10/08/2016  . CKD (chronic kidney disease) stage 3, GFR 30-59 ml/min (HCC) 10/08/2016  . Bloating 09/03/2013  . CAD S/P percutaneous coronary angioplasty   . MI (myocardial infarction) ([place])   . Presence of bare metal stent in LAD coronary artery - VerifFlex BMS 3.0 mm x 12 mm - post-dilated to 3.5 mm 08/01/2012   . Unstable angina ([place]) 07/30/2012  . Gastritis 12/15/2010  . Nausea 08/30/2010  . Hyperlipidemia with target low density lipoprotein (LDL) cholesterol less than 70 mg/dL 05/21/2006  . CATARACT NOS 05/21/2006  . Essential hypertension 05/21/2006  . MYOCARDIAL INFARCTION, HX OF 05/21/2006  . GERD 05/21/2006  . OVERACTIVE BLADDER 05/21/2006  . OSTEOARTHRITIS 05/21/2006  . URINARY INCONTINENCE 05/21/2006    Past Medical History: Past Medical History:  Diagnosis Date  . CAD S/P percutaneous coronary angioplasty 08/01/2012   PCI of mid LAD with a VeriFlex Bare Metal Stent 3.0 mm x 12 mm - post-dilated to 3.5 mm.;  Catheterization in July 2018 showed with severe heavily calcified distal circumflex-left PDA stenosis.  Not favorable for PCI.  . Duodenal ulcer May 2011   on EGD  . GERD (gastroesophageal reflux disease)   . Helicobacter pylori gastritis MAY 2012 HP STOOL AG NEG  . HTN (hypertension)   . Hyperlipidemia   . MI (myocardial infarction) ([place]) 1976, 1980, [place] for at [facility] and [place] (Dr. Iona Beard)  . Osteoarthritis   . S/P colonoscopy 2011   normal, internal hemorrhoids    Past Surgical History: Past Surgical History:  Procedure Laterality Date  . CARDIAC CATHETERIZATION  01/30/2007   D1 75 %, mid LAD 50%, 50-70% circumflex, 50-70% RCA.(Dr. Adora Fridge)  .  CARDIAC CATHETERIZATION  12/11/2001   normal L main, small RCA, dominant LL Cfx with mild diffuse disease, LAD with mid 10-20% stenosis, ramus intermedius with mild diffuse disease (Dr. Jackie Plum)  . CATARACT EXTRACTION    . COLONOSCOPY  OCT 2011 ARS   SML Joanna   in setting of MI  . ESOPHAGOGASTRODUODENOSCOPY N/A 10/09/2016   Procedure: ESOPHAGOGASTRODUODENOSCOPY (EGD);  Surgeon: Danie Binder, MD;  [facility];  Service: Endoscopy;  Laterality: N/A;  . FEMUR IM NAIL Right 12/01/2016   Procedure: INTRAMEDULLARY (IM) NAIL RIGHT HIP;  Surgeon: Altamese Coburn, MD;  Location: [place];  Service: Orthopedics;  Laterality: Right;  . LEFT HEART CATH AND CORONARY ANGIOGRAPHY N/A 10/23/2016   Procedure: Left Heart Cath and Coronary Angiography;  Surgeon: Sherren Mocha, MD;  Location: [place] CV LAB;  Service: Cardiovascular: Two-vessel CAD (left dominant).  Patent BMS in p LAD.  Severe, heavily calcified dCx-LPDA lesion.  Not favorable for PCI.  Marland Kitchen LEFT HEART CATHETERIZATION WITH CORONARY ANGIOGRAM N/A 08/01/2012   Procedure: LEFT HEART CATHETERIZATION WITH CORONARY ANGIOGRAM;  Surgeon: Leonie Man, MD;  Location: [facility] CATH LAB: Mid LAD 99% apple core; D2 ostial 70-80% (2 small for PCI) small nondominant RCA 60-70%. Distal circumflex/L PDA ~50% --> PCI of LAD  . NM MYOCAR PERF WALL MOTION  08/2003   adenosine stress - focal decreased perfusion defect in distal inferior wall, no significant ischemic changes  . PERCUTANEOUS STENT INTERVENTION  08/01/2012   Procedure: PERCUTANEOUS STENT INTERVENTION;  Surgeon: Leonie Man, MD;  Location: [facility] CATH LAB; ;PCI of mid LAD with a VeriFlex Bare Metal Stent 3.0 mm x 12 mm - post-dilated to 3.5 mm.  . TRANSTHORACIC ECHOCARDIOGRAM  10/2016   mild LVH - 60-65%. Gr 1 DD. No RWMA. Mild MR. Mod TR. Mild RA dilation.  PAP ~ 60 mmHg.  Marland Kitchen UPPER GASTROINTESTINAL ENDOSCOPY  08/2009   MELENA, HEMATEMESIS --> DUODENAL ULCER, Bx: H PYLORI POS    Social History: Social History   Tobacco Use  . Smoking status: Never Smoker  . Smokeless tobacco: Never Used  Substance Use Topics  . Alcohol use: No  . Drug use: No   Additional social history: lives at home with grandson. Does not drive- since 36LP  Please also refer to relevant sections of EMR.  Family History: Family History  Problem Relation Age of Onset  . Heart disease Mother   . Kidney disease Father   . Heart disease Brother 67  . Heart disease Sister   . Colon cancer Neg Hx     Allergies and Medications: No Known Allergies No current  facility-administered medications on file prior to encounter.    Current Outpatient Medications on File Prior to Encounter  Medication Sig Dispense Refill  . acetaminophen (TYLENOL) 325 MG tablet Take 2 tablets (650 mg total) by mouth 3 (three) times daily as needed (pain).    Marland Kitchen atorvastatin (LIPITOR) 80 MG tablet Take 1 tablet (80 mg total) by mouth daily at 6 PM. 30 tablet 5  . cetirizine (ZYRTEC) 5 MG tablet Take 10 mg by mouth daily.     . hydrALAZINE (APRESOLINE) 10 MG tablet Take 10 mg 2 (two) times daily by mouth.    . hydrochlorothiazide (HYDRODIURIL) 25 MG tablet Take 25 mg daily by mouth.    . isosorbide dinitrate (ISORDIL) 30 MG tablet Take 30 mg 4 (four) times daily by mouth.    . metoprolol (  LOPRESSOR) 50 MG tablet Take 50 mg by mouth 2 (two) times daily.    . Multiple Vitamin (MULTIVITAMIN WITH MINERALS) TABS tablet Take 1 tablet by mouth daily.    Marland Kitchen NITROSTAT 0.4 MG SL tablet PLACE 1 TABLET UNDER TONGUE EVERY 5 MINUTES FOR 3 DOSES AS NEEDED. 25 tablet 6  . valsartan (DIOVAN) 320 MG tablet Take 320 mg daily by mouth.      Objective: BP 120/68 (BP Location: Right Arm)   Pulse 77   Temp 98 F (36.7 C) (Oral)   Resp 16   Ht 5\' 3"  (1.6 m)   Wt 77.1 kg   SpO2 96%   BMI 30.11 kg/m  Exam: General: well-appearing, NAD, pleasant Eyes: positive arcus senilis  ENTM: poor dentition, moist mucous membranes Neck: soft, non-tender Cardiovascular: RRR, 2/6 systolic murmur, 2+ bilateral pedal pulses, good cap refill Respiratory: CTAB, no increased WOB Gastrointestinal: soft, non-tender abdomen, +BS diffusely MSK: unable to bear weight on left leg. Right leg has appropriate ROM and strength. Left LE has no passive ROM at hip or knee, appropriate ROM of ankle and toes, no obvious hip deformity, no tenderness to palpation, no skin changes. Derm: no lesions/abrasion/ecchymosis elsewhere on body Neuro: alert and oriented, no focal deficits in upper or lower extremities. Speech  normal Psych: good capacity, very pleasant and in jovial mood  Labs and Imaging: CBC BMET  Recent Labs  Lab 05/07/18 1153  WBC 10.6*  HGB 14.2  HCT 47.2  PLT 133*   Recent Labs  Lab 05/07/18 1153  NA 138  K 4.0  CL 101  CO2 25  BUN 19  CREATININE 1.41*  GLUCOSE 99  CALCIUM 9.4     Vitamin D- pending  Dg Chest 2 View  Result Date: 05/07/2018 CLINICAL DATA:  Fall and LEFT hip fracture. EXAM: CHEST - 2 VIEW COMPARISON:  11/30/2016 and prior chest radiographs FINDINGS: UPPER limits normal heart size noted. Chronic interstitial prominence and scarring bilaterally again noted. No new pulmonary abnormalities are identified. There is no evidence of pneumothorax or definite pleural effusion. No acute bony abnormalities are identified. IMPRESSION: 1. No evidence of acute abnormality. 2. Chronic interstitial prominence and pulmonary scarring. Electronically Signed   By: Margarette Canada M.D.   On: 05/07/2018 12:34   Dg Hip Unilat W Or Wo Pelvis 2-3 Views Left  Result Date: 05/07/2018 CLINICAL DATA:  Fall EXAM: DG HIP (WITH OR WITHOUT PELVIS) 2-3V LEFT COMPARISON:  06/30/2016 FINDINGS: There is a minimally displaced fracture through the intertrochanteric region of the left femur. There is cortical step-off just proximal to the lesser trochanter. An intramedullary rod and dynamic compression screw are present in the proximal right femur. Osteopenia. Mild degenerative change of the SI and hip joints. No breakage or loosening of the hardware. IMPRESSION: Acute minimally displaced left intertrochanteric femur fracture. Electronically Signed   By: Marybelle Killings M.D.   On: 05/07/2018 12:33     Richarda Osmond, DO 05/07/2018, 2:04 PM PGY-1, [place] Intern pager: [phone], text pages welcome  FPTS Upper-Level Resident Addendum   I have independently interviewed and examined the patient. I have discussed the above with the original author and agree with their  documentation. My edits for correction/addition/clarification are in blue. Please see also any attending notes.   Caroline More, DO PGY-2, [facility] 05/07/2018 5:22 PM  FPTS Service pager: [phone] (text pages welcome through [place])

## 2018-05-08 ENCOUNTER — Encounter (HOSPITAL_COMMUNITY): Payer: Self-pay | Admitting: Orthopedic Surgery

## 2018-05-08 LAB — CBC
HCT: 41.5 % (ref 39.0–52.0)
Hemoglobin: 13 g/dL (ref 13.0–17.0)
MCH: 28.4 pg (ref 26.0–34.0)
MCHC: 31.3 g/dL (ref 30.0–36.0)
MCV: 90.8 fL (ref 80.0–100.0)
Platelets: 123 10*3/uL — ABNORMAL LOW (ref 150–400)
RBC: 4.57 MIL/uL (ref 4.22–5.81)
RDW: 11.4 % — ABNORMAL LOW (ref 11.5–15.5)
WBC: 14.4 10*3/uL — ABNORMAL HIGH (ref 4.0–10.5)
nRBC: 0 % (ref 0.0–0.2)

## 2018-05-08 LAB — BASIC METABOLIC PANEL
Anion gap: 11 (ref 5–15)
BUN: 18 mg/dL (ref 8–23)
CO2: 24 mmol/L (ref 22–32)
Calcium: 8.7 mg/dL — ABNORMAL LOW (ref 8.9–10.3)
Chloride: 100 mmol/L (ref 98–111)
Creatinine, Ser: 1.3 mg/dL — ABNORMAL HIGH (ref 0.61–1.24)
GFR calc Af Amer: 54 mL/min — ABNORMAL LOW (ref >60)
GFR, EST NON AFRICAN AMERICAN: 47 mL/min — AB (ref >60)
Glucose, Bld: 105 mg/dL — ABNORMAL HIGH (ref 70–99)
Potassium: 4.4 mmol/L (ref 3.5–5.1)
Sodium: 135 mmol/L (ref 135–145)

## 2018-05-08 MED ORDER — OXYCODONE HCL 5 MG PO TABS: 5.0000 mg | ORAL_TABLET | ORAL | Status: DC | PRN

## 2018-05-08 MED ORDER — APIXABAN 2.5 MG PO TABS
2.5000 mg | ORAL_TABLET | Freq: Two times a day (BID) | ORAL | Status: DC
  Administered 2018-05-08 – 2018-05-09 (×3): 2.5 mg via ORAL
  Filled 2018-05-08 (×3): qty 1

## 2018-05-08 NOTE — Progress Notes (Signed)
   Subjective:  Patient reports pain as mild.  He has no specific complaints.  He denies chest pain, shortness of breath, night sweats or chills.  He has been up out of the bed to the chair.  Objective:   VITALS:   Vitals:   05/08/18 0015 05/08/18 0346 05/08/18 0831 05/08/18 1309  BP: 138/67 (!) 132/54 (!) 121/50 (!) 103/53  Pulse: 67 60 83 73  Resp: 14 15  (!) 22  Temp: 98 F (36.7 C) 97.6 F (36.4 C)  97.7 F (36.5 C)  TempSrc: Oral Oral  Oral  SpO2: 96% 100%  96%  Weight:      Height:        Neurologically intact Neurovascular intact Sensation intact distally Intact pulses distally Dorsiflexion/Plantar flexion intact Incision: dressing C/D/I No pain with passive motion   Lab Results  Component Value Date   WBC 14.4 (H) 05/08/2018   HGB 13.0 05/08/2018   HCT 41.5 05/08/2018   MCV 90.8 05/08/2018   PLT 123 (L) 05/08/2018   BMET    Component Value Date/Time   NA 135 05/08/2018 0140   K 4.4 05/08/2018 0140   CL 100 05/08/2018 0140   CO2 24 05/08/2018 0140   GLUCOSE 105 (H) 05/08/2018 0140   BUN 18 05/08/2018 0140   CREATININE 1.30 (H) 05/08/2018 0140   CREATININE 1.34 10/25/2012 0729   CALCIUM 8.7 (L) 05/08/2018 0140   GFRNONAA 47 (L) 05/08/2018 0140   GFRAA 54 (L) 05/08/2018 0140     Assessment/Plan: 1 Day Post-Op   Active Problems:   Fracture   Closed displaced intertrochanteric fracture of left femur (HCC)   Coronary artery disease involving native heart without angina pectoris   Up with therapy -Weight-bear as tolerated to left lower extremity -Maintain bandages unless they become saturated at which point they can be changed to daily dry dressing changes. -For DVT prophylaxis mechanical SCDs in the hospital with resumption of his Plavix.  We will use his Plavix as his antiplatelet therapy. -Return to see Dr. Stann Mainland in 2 to 3 weeks for wound check and staple removal.   Anthony Galvan 05/08/2018, 2:02 PM   Geralynn Rile, MD [phone]

## 2018-05-08 NOTE — NC FL2 (Signed)
[place] LEVEL OF CARE SCREENING TOOL     IDENTIFICATION  Patient Name: Anthony Galvan Birthdate: 1921-11-05 Sex: male Admission Date (Current Location): 05/07/2018  [facility] and [place] Number:  [employer] and Address:  [facility] [place] [address] [place], [place] [address]      Provider Number: [id number]  Attending Physician Name and Address:  Zenia Resides, MD  Relative Name and Phone Number:  Santiago Glad (daughter ) [phone]    Current Level of Care: Hospital Recommended Level of Care: [place] Prior Approval Number:    Date Approved/Denied: 12/04/16 PASRR Number: [id number]  Discharge Plan: SNF    Current Diagnoses: Patient Active Problem List   Diagnosis Date Noted  . Fracture 05/07/2018  . Closed displaced intertrochanteric fracture of left femur ([place])   . Coronary artery disease involving native heart without angina pectoris   . Dyspepsia 01/18/2017  . Closed disp intertrochanteric fracture of right femur with nonunion 11/30/2016  . PUD (peptic ulcer disease)   . History of coronary artery stent placement 10/08/2016  . Acute on chronic renal failure ([place]) 10/08/2016  . CKD (chronic kidney disease) stage 3, GFR 30-59 ml/min (HCC) 10/08/2016  . Bloating 09/03/2013  . CAD S/P percutaneous coronary angioplasty   . MI (myocardial infarction) ([place])   . Presence of bare metal stent in LAD coronary artery - VerifFlex BMS 3.0 mm x 12 mm - post-dilated to 3.5 mm 08/01/2012  . Unstable angina ([place]) 07/30/2012  . Gastritis 12/15/2010  . Nausea 08/30/2010  . Hyperlipidemia with target low density lipoprotein (LDL) cholesterol less than 70 mg/dL 05/21/2006  . CATARACT NOS 05/21/2006  . Essential hypertension 05/21/2006  . MYOCARDIAL INFARCTION, HX OF 05/21/2006  . GERD 05/21/2006  . OVERACTIVE BLADDER 05/21/2006  . OSTEOARTHRITIS 05/21/2006  . URINARY INCONTINENCE 05/21/2006    Orientation  RESPIRATION BLADDER Height & Weight     Self, Time, Situation, Place  O2(nasal cannula 2L/min) Continent Weight: 77.1 kg Height:  5\' 3"  (160 cm)  BEHAVIORAL SYMPTOMS/MOOD NEUROLOGICAL BOWEL NUTRITION STATUS      Continent Diet(see discharge summary)  AMBULATORY STATUS COMMUNICATION OF NEEDS Skin   Extensive Assist Verbally Surgical wounds(left thigh and right hip closed surgical incisions)                       Personal Care Assistance Level of Assistance  Bathing, Feeding, Dressing, Total care Bathing Assistance: Maximum assistance Feeding assistance: Independent Dressing Assistance: Maximum assistance Total Care Assistance: Maximum assistance   Functional Limitations Info  Sight, Hearing, Speech Sight Info: Adequate Hearing Info: Adequate Speech Info: Adequate    SPECIAL CARE FACTORS FREQUENCY  PT (By licensed PT), OT (By licensed OT)     PT Frequency: min 5x weekly OT Frequency: min 5x weekly            Contractures Contractures Info: Not present    Additional Factors Info  Allergies, Code Status Code Status Info: partial Allergies Info: Lisinopril           Current Medications (05/08/2018):  This is the current hospital active medication list Current Facility-Administered Medications  Medication Dose Route Frequency Provider Last Rate Last Dose  . acetaminophen (TYLENOL) tablet 650 mg  650 mg Oral Q6H Anderson, Chelsey L, DO   650 mg at 05/08/18 0867   Or  . acetaminophen (TYLENOL) suppository 650 mg  650 mg Rectal Q6H Anderson, Chelsey L, DO      .  apixaban (ELIQUIS) tablet 2.5 mg  2.5 mg Oral BID Caroline More, DO      . atorvastatin (LIPITOR) tablet 80 mg  80 mg Oral q1800 Anderson, Chelsey L, DO      . docusate sodium (COLACE) capsule 100 mg  100 mg Oral BID Nicholes Stairs, MD   100 mg at 05/08/18 0841  . hydrALAZINE (APRESOLINE) injection 2 mg  2 mg Intravenous Q4H PRN [place] Sills, Chelsey L, DO      . isosorbide mononitrate (IMDUR) 24 hr  tablet 30 mg  30 mg Oral Daily Abraham, Sherin, DO   30 mg at 05/08/18 0841  . metoCLOPramide (REGLAN) tablet 5-10 mg  5-10 mg Oral Q8H PRN Nicholes Stairs, MD       Or  . metoCLOPramide [facility]) injection 5-10 mg  5-10 mg Intravenous Q8H PRN Nicholes Stairs, MD      . metoprolol tartrate (LOPRESSOR) tablet 50 mg  50 mg Oral BID Anderson, Chelsey L, DO   50 mg at 05/08/18 0841  . nitroGLYCERIN (NITROSTAT) SL tablet 0.4 mg  0.4 mg Sublingual Q5 min PRN Zenia Resides, MD      . ondansetron [facility]) tablet 4 mg  4 mg Oral Q6H PRN Nicholes Stairs, MD       Or  . ondansetron [facility]) injection 4 mg  4 mg Intravenous Q6H PRN Nicholes Stairs, MD      . polyethylene glycol (MIRALAX / GLYCOLAX) packet 17 g  17 g Oral Daily Anderson, Chelsey L, DO   17 g at 05/08/18 0841  . traMADol (ULTRAM) tablet 50 mg  50 mg Oral Q6H PRN Nicholes Stairs, MD         Discharge Medications: Please see discharge summary for a list of discharge medications.  Relevant Imaging Results:  Relevant Lab Results:   Additional Information SSN: [id number]  Alberteen Sam, LCSW

## 2018-05-08 NOTE — Evaluation (Signed)
Occupational Therapy Evaluation Patient Details Name: Anthony Galvan MRN: [id number] DOB: 1922-03-12 Today's Date: 05/08/2018    History of Present Illness Pt is a 85 y.o. male who sustained L hip fx when [place]. He underwent IM nail 05/07/18. PMH consists of OA, CAD, previous MI and R hip fx (11/2016).    Clinical Impression   Pt is a 85 yo male s/p above dx. Pt PTA: Pt living with grandson at home and mostly independent with ADL and mobility with SPC. Pt currently, pt performing bed mobility with maxA; sit to stand with modA and transfers with taking a few steps w/ RW +2. Pt performing minA for UB ADL and modA for LB ADL. Pt with limited ability to care for self and with increased pain, pt would benefit from continued OT skilled services for ADL, mobility and safety in SNF setting.    Follow Up Recommendations  SNF;Supervision/Assistance - 24 hour    Equipment Recommendations  3 in 1 bedside commode    Recommendations for Other Services       Precautions / Restrictions Precautions Precautions: Fall Restrictions Weight Bearing Restrictions: Yes LLE Weight Bearing: Weight bearing as tolerated      Mobility Bed Mobility Overal bed mobility: Needs Assistance Bed Mobility: Supine to Sit Rolling: Mod assist Sidelying to sit: Max assist;HOB elevated Supine to sit: +2 for physical assistance;Mod assist     General bed mobility comments: assist with LLE and to elevate trunk. Use of bed pad to scoot to EOB.  Transfers Overall transfer level: Needs assistance Equipment used: Rolling walker (2 wheeled) Transfers: Sit to/from Stand Sit to Stand: Mod assist         General transfer comment: cues for hand placement, assist to power up and stabilize initial standing balance    Balance Overall balance assessment: Needs assistance Sitting-balance support: Feet supported;No upper extremity supported Sitting balance-Leahy Scale: Fair      Standing balance support: Bilateral upper extremity supported;During functional activity Standing balance-Leahy Scale: Poor Standing balance comment: reliant on RW and external support                           ADL either performed or assessed with clinical judgement   ADL Overall ADL's : Needs assistance/impaired Eating/Feeding: Set up   Grooming: Wash/dry hands;Wash/dry face;Oral care;Sitting;Set up   Upper Body Bathing: Minimal assistance;Sitting   Lower Body Bathing: Moderate assistance;Sitting/lateral leans;Cueing for safety;Cueing for sequencing   Upper Body Dressing : Minimal assistance;Sitting   Lower Body Dressing: Moderate assistance;Cueing for safety;Cueing for sequencing;Sitting/lateral leans   Toilet Transfer: Moderate assistance;BSC;RW   Toileting- Clothing Manipulation and Hygiene: Moderate assistance;Cueing for safety;Cueing for sequencing;Sitting/lateral lean;Sit to/from stand       Functional mobility during ADLs: Moderate assistance;Rolling walker(+2 for stability and IV pole; can transfer with +1) General ADL Comments: requires increased time      Vision Baseline Vision/History: No visual deficits Patient Visual Report: No change from baseline Vision Assessment?: No apparent visual deficits     Perception     Praxis      Pertinent Vitals/Pain Pain Assessment: No/denies pain Faces Pain Scale: Hurts little more Pain Descriptors / Indicators: Sore Pain Intervention(s): Limited activity within patient's tolerance;Repositioned     Hand Dominance Right   Extremity/Trunk Assessment Upper Extremity Assessment Upper Extremity Assessment: Defer to OT evaluation   Lower Extremity Assessment Lower Extremity Assessment: LLE deficits/detail LLE Deficits /  Details: s/p IM nail   Cervical / Trunk Assessment Cervical / Trunk Assessment: Normal   Communication Communication Communication: No difficulties   Cognition Arousal/Alertness:  Awake/alert Behavior During Therapy: WFL for tasks assessed/performed Overall Cognitive Status: Within Functional Limits for tasks assessed                                     General Comments  Pt on 2 L O2 throughout session. SpO2 93%    Exercises     Shoulder Instructions      Home Living Family/patient expects to be discharged to:: Skilled nursing facility Living Arrangements: Other relatives(grandson) Available Help at Discharge: Family;Available PRN/intermittently Type of Home: House       Home Layout: One level     Bathroom Shower/Tub: Teacher, early years/pre: Standard     Home Equipment: Environmental consultant - 2 wheels;Shower seat;Hand held shower head;Electric scooter;Grab bars - tub/shower          Prior Functioning/Environment Level of Independence: Independent with assistive device(s)        Comments: SPC usually and scooter for long distances        OT Problem List: Decreased strength;Decreased activity tolerance;Impaired balance (sitting and/or standing);Decreased coordination;Decreased safety awareness;Pain      OT Treatment/Interventions: Self-care/ADL training;Therapeutic exercise;Energy conservation;Therapeutic activities;Patient/family education;Balance training    OT Goals(Current goals can be found in the care plan section) Acute Rehab OT Goals Patient Stated Goal: get better OT Goal Formulation: With patient Time For Goal Achievement: 05/22/18 Potential to Achieve Goals: Good  OT Frequency: Min 2X/week   Barriers to D/C:            Co-evaluation   Reason for Co-Treatment: For patient/therapist safety;To address functional/ADL transfers PT goals addressed during session: Mobility/safety with mobility;Balance        AM-PAC OT "6 Clicks" Daily Activity     Outcome Measure Help from another person eating meals?: None Help from another person taking care of personal grooming?: None Help from another person toileting,  which includes using toliet, bedpan, or urinal?: A Lot Help from another person bathing (including washing, rinsing, drying)?: A Lot Help from another person to put on and taking off regular upper body clothing?: A Little Help from another person to put on and taking off regular lower body clothing?: A Lot 6 Click Score: 17   End of Session Equipment Utilized During Treatment: Gait belt;Rolling walker Nurse Communication: Mobility status  Activity Tolerance: Patient limited by pain;Patient limited by fatigue Patient left: in chair;with call bell/phone within reach;with chair alarm set  OT Visit Diagnosis: Unsteadiness on feet (R26.81);Muscle weakness (generalized) (M62.81)                Time: 1130-1150 OT Time Calculation (min): 20 min Charges:  OT General Charges $OT Visit: 1 Visit OT Evaluation $OT Eval Moderate Complexity: 1 Mod  Darryl Nestle) Marsa Aris OTR/L Acute Rehabilitation Services Pager: [phone] Office: [phone]   Fredda Hammed 05/08/2018, 1:36 PM

## 2018-05-08 NOTE — Plan of Care (Signed)
  Problem: Pain Managment: Goal: General experience of comfort will improve Outcome: Progressing   Problem: Safety: Goal: Ability to remain free from injury will improve Outcome: Progressing   

## 2018-05-08 NOTE — Progress Notes (Signed)
Family Medicine Teaching Service Daily Progress Note Intern Pager: [phone]  Patient name: Anthony Galvan Medical record number: [id number] Date of birth: [birth year] Age: 85 y.o. Gender: male  Primary Care Provider: [name], MD Consultants: ortho Code Status: partial- wants chest compressions and/or intubation if necessary but does not want cardioversion or medication in event of cardiac arrest  Pt Overview and Major Events to Date:  1/21-admitted 1/21- left hip fracture fixation  Assessment and Plan: BRAXTIN BAMBA is a 85 y.o. male presenting with minimally-displaced L hip fracture . PMH is significant for CAD, GERD, HTN, HLD, h/o MI, osteoarthritis  L hip fracture- fixation last night. Pain is well controlled. Patient is on regular diet. PT/OT to see today. Not continuing plavix. Hgb 13.0 today. Patient required 2L O2 [place] overnight but [place] was not in his nose when in room and patient was sating >95% so discontinued the oxygen. Vitamin D not resulted. Meds: - scheduled tylenol - tramadol q6hr PRN - peri-operative ancef Labs: - CBC am - vitamin D level Other: - vitals per floor protocol - fall precautions - SCDs for ppx - recommend DEXA scan outpatient if not already performed. Consults: - ortho following - PT/OT following  HTN/CAD/h/o MI- Home meds: hydralazine 10mg  BID, HCTZ 25mg  daily, isosorbide dinitrate 30mg  TID, metoprolol 50mg  BID. BP 121/50 this morning - hold home hydral and HCTZ - continue home metoprolol and isosorbide - hydralazine PRN for SBP >190, DBP>110  HLD- Home meds: atorvastatin 80mg  - continue home statin  GOC- would recommend documenting his desires for code status  FEN/GI: 100cc/hr maintenance 1/2NS Prophylaxis: SCDs  Disposition: discharge pending improvement of hip   Subjective:  Patient states that he has no pain today. He is able to move his hip again. He was waiting for someone to come feed him and excited for breakfast.    Objective: Temp:  [97.6 F (36.4 C)-98.1 F (36.7 C)] 97.6 F (36.4 C) (01/22 0346) Pulse Rate:  [60-102] 60 (01/22 0346) Resp:  [14-22] 15 (01/22 0346) BP: (120-192)/(54-99) 132/54 (01/22 0346) SpO2:  [81 %-100 %] 100 % (01/22 0346) Weight:  [77.1 kg] 77.1 kg (01/21 1124) Physical Exam: General: well-appearing, alert, NAD Cardiovascular: RRR, 2/6 systolic murmur, pulses distal to surgery intact, well-perfused Respiratory: CTAB, no increased WOB. Sating >95% ORA during exam Abdomen: soft, non-tender, non-distended Extremities: able to move left hip without pain. No hematoma, surgical incision dressing clean, dry, intact  Laboratory: Recent Labs  Lab 05/07/18 1153 05/08/18 0140  WBC 10.6* 14.4*  HGB 14.2 13.0  HCT 47.2 41.5  PLT 133* 123*   Recent Labs  Lab 05/07/18 1153 05/08/18 0140  NA 138 135  K 4.0 4.4  CL 101 100  CO2 25 24  BUN 19 18  CREATININE 1.41* 1.30*  CALCIUM 9.4 8.7*  GLUCOSE 99 105*   Vitamin D pending  Imaging/Diagnostic Tests: Dg Chest 2 View  Result Date: 05/07/2018 CLINICAL DATA:  Fall and LEFT hip fracture. EXAM: CHEST - 2 VIEW COMPARISON:  11/30/2016 and prior chest radiographs FINDINGS: UPPER limits normal heart size noted. Chronic interstitial prominence and scarring bilaterally again noted. No new pulmonary abnormalities are identified. There is no evidence of pneumothorax or definite pleural effusion. No acute bony abnormalities are identified. IMPRESSION: 1. No evidence of acute abnormality. 2. Chronic interstitial prominence and pulmonary scarring. Electronically Signed   By: Margarette Canada M.D.   On: 05/07/2018 12:34   Dg C-arm 1-60 Min  Result Date: 05/07/2018 CLINICAL DATA:  Hip  fracture EXAM: OPERATIVE left HIP (WITH PELVIS IF PERFORMED) 4 VIEWS TECHNIQUE: Fluoroscopic spot image(s) were submitted for interpretation post-operatively. COMPARISON:  05/07/2017 FINDINGS: Four low resolution intraoperative spot views of the left hip. Total  fluoroscopy time was 59 seconds. The images demonstrate intramedullary rod and screw fixation of left intertrochanteric fracture. IMPRESSION: Intraoperative fluoroscopic assistance provided during surgical fixation of left femur fracture Electronically Signed   By: Donavan Foil M.D.   On: 05/07/2018 19:48   Dg Hip Operative Unilat W Or W/o Pelvis Left  Result Date: 05/07/2018 CLINICAL DATA:  Hip fracture EXAM: OPERATIVE left HIP (WITH PELVIS IF PERFORMED) 4 VIEWS TECHNIQUE: Fluoroscopic spot image(s) were submitted for interpretation post-operatively. COMPARISON:  05/07/2017 FINDINGS: Four low resolution intraoperative spot views of the left hip. Total fluoroscopy time was 59 seconds. The images demonstrate intramedullary rod and screw fixation of left intertrochanteric fracture. IMPRESSION: Intraoperative fluoroscopic assistance provided during surgical fixation of left femur fracture Electronically Signed   By: Donavan Foil M.D.   On: 05/07/2018 19:48   Dg Hip Unilat W Or Wo Pelvis 2-3 Views Left  Result Date: 05/07/2018 CLINICAL DATA:  Fall EXAM: DG HIP (WITH OR WITHOUT PELVIS) 2-3V LEFT COMPARISON:  06/30/2016 FINDINGS: There is a minimally displaced fracture through the intertrochanteric region of the left femur. There is cortical step-off just proximal to the lesser trochanter. An intramedullary rod and dynamic compression screw are present in the proximal right femur. Osteopenia. Mild degenerative change of the SI and hip joints. No breakage or loosening of the hardware. IMPRESSION: Acute minimally displaced left intertrochanteric femur fracture. Electronically Signed   By: Marybelle Killings M.D.   On: 05/07/2018 12:33    Richarda Osmond, DO 05/08/2018, 7:30 AM PGY-1, [place] Intern pager: [phone], text pages welcome

## 2018-05-08 NOTE — Evaluation (Signed)
Physical Therapy Evaluation Patient Details Name: Anthony Galvan MRN: [id number] DOB: 1921-11-19 Today's Date: 05/08/2018   History of Present Illness  Pt is a 85 y.o. male who sustained L hip fx [place] He underwent IM nail 05/07/18. PMH consists of OA, CAD, previous MI and R hip fx (11/2016).     Clinical Impression  Pt admitted with above diagnosis. Pt currently with functional limitations due to the deficits listed below (see PT Problem List). PTA pt independent with mobility and ADLs. He utilized a cane for ambulation. On eval, he required +2 mod assist bed mobility, mod assist sit to stand, and +2 mod assist ambulation 3 feet with RW.  Pt will benefit from skilled PT to increase their independence and safety with mobility to allow discharge to the venue listed below.       Follow Up Recommendations SNF([facility] )    Equipment Recommendations  None recommended by PT    Recommendations for Other Services       Precautions / Restrictions Precautions Precautions: Fall Restrictions Weight Bearing Restrictions: Yes LLE Weight Bearing: Weight bearing as tolerated      Mobility  Bed Mobility Overal bed mobility: Needs Assistance Bed Mobility: Supine to Sit     Supine to sit: +2 for physical assistance;Mod assist     General bed mobility comments: assist with LLE and to elevate trunk. Use of bed pad to scoot to EOB.  Transfers Overall transfer level: Needs assistance Equipment used: Rolling walker (2 wheeled) Transfers: Sit to/from Stand Sit to Stand: Mod assist         General transfer comment: cues for hand placement, assist to power up and stabilize initial standing balance  Ambulation/Gait Ambulation/Gait assistance: Mod assist;+2 physical assistance Gait Distance (Feet): 3 Feet Assistive device: Rolling walker (2 wheeled) Gait Pattern/deviations: Step-to pattern;Antalgic;Trunk flexed Gait velocity:  decreased Gait velocity interpretation: <1.8 ft/sec, indicate of risk for recurrent falls General Gait Details: cues for sequencing, assist to maintain balance, L knee buckling noted with weight shift over LLE  Stairs            Wheelchair Mobility    Modified Rankin (Stroke Patients Only)       Balance Overall balance assessment: Needs assistance Sitting-balance support: Feet supported;No upper extremity supported Sitting balance-Leahy Scale: Fair     Standing balance support: Bilateral upper extremity supported;During functional activity Standing balance-Leahy Scale: Poor Standing balance comment: reliant on RW and external support                             Pertinent Vitals/Pain Pain Assessment: No/denies pain    Home Living Family/patient expects to be discharged to:: Skilled nursing facility Living Arrangements: Other relatives(grandson) Available Help at Discharge: Family;Available PRN/intermittently Type of Home: House       Home Layout: One level Home Equipment: [place] - 2 wheels;Shower seat;Hand held shower head;Electric scooter;Grab bars - tub/shower      Prior Function Level of Independence: Independent with assistive device(s)         Comments: SPC usually and scooter for long distances     Hand Dominance   Dominant Hand: Right    Extremity/Trunk Assessment   Upper Extremity Assessment Upper Extremity Assessment: Defer to OT evaluation    Lower Extremity Assessment Lower Extremity Assessment: LLE deficits/detail LLE Deficits / Details: s/p IM nail       Communication  Communication: No difficulties  Cognition Arousal/Alertness: Awake/alert Behavior During Therapy: WFL for tasks assessed/performed Overall Cognitive Status: Within Functional Limits for tasks assessed                                        General Comments General comments (skin integrity, edema, etc.): Pt on 2 L O2 throughout  session. SpO2 93%    Exercises     Assessment/Plan    PT Assessment Patient needs continued PT services  PT Problem List Decreased strength;Decreased balance;Decreased mobility;Decreased activity tolerance       PT Treatment Interventions Functional mobility training;DME instruction;Balance training;Patient/family education;Gait training;Therapeutic activities;Therapeutic exercise    PT Goals (Current goals can be found in the Care Plan section)  Acute Rehab PT Goals Patient Stated Goal: get better PT Goal Formulation: With patient Time For Goal Achievement: 05/22/18 Potential to Achieve Goals: Good    Frequency Min 3X/week   Barriers to discharge Decreased caregiver support      Co-evaluation PT/OT/SLP Co-Evaluation/Treatment: Yes Reason for Co-Treatment: For patient/therapist safety;To address functional/ADL transfers PT goals addressed during session: Mobility/safety with mobility;Balance         AM-PAC PT "6 Clicks" Mobility  Outcome Measure Help needed turning from your back to your side while in a flat bed without using bedrails?: A Lot Help needed moving from lying on your back to sitting on the side of a flat bed without using bedrails?: A Lot Help needed moving to and from a bed to a chair (including a wheelchair)?: A Lot Help needed standing up from a chair using your arms (e.g., wheelchair or bedside chair)?: A Lot Help needed to walk in hospital room?: A Lot Help needed climbing 3-5 steps with a railing? : Total 6 Click Score: 11    End of Session Equipment Utilized During Treatment: Gait belt;Oxygen Activity Tolerance: Patient tolerated treatment well Patient left: in chair;with chair alarm set;with call bell/phone within reach Nurse Communication: Mobility status PT Visit Diagnosis: Other abnormalities of gait and mobility (R26.89);Difficulty in walking, not elsewhere classified (R26.2);History of falling (Z91.81)    Time: 1130-1155 PT Time  Calculation (min) (ACUTE ONLY): 25 min   Charges:   PT Evaluation $PT Eval Moderate Complexity: 1 Mod          Lorrin Goodell, PT  Office # [phone] Pager [phone]   Lorriane Shire 05/08/2018, 12:24 PM

## 2018-05-08 NOTE — Discharge Instructions (Addendum)
Orthopedic discharge instructions: -Okay for full weightbearing as tolerated to the left lower extremity -Maintain your postoperative bandage until your follow-up appointment.  This is waterproof and can be left in place while you shower.  Please do not submerge underwater. -Follow-up with Dr. Stann Mainland in 2 to 3 weeks for wound check.

## 2018-05-09 DIAGNOSIS — E871 Hypo-osmolality and hyponatremia: Secondary | ICD-10-CM

## 2018-05-09 DIAGNOSIS — I2510 Atherosclerotic heart disease of native coronary artery without angina pectoris: Secondary | ICD-10-CM

## 2018-05-09 DIAGNOSIS — N183 Chronic kidney disease, stage 3 (moderate): Secondary | ICD-10-CM

## 2018-05-09 LAB — VITAMIN D 25 HYDROXY (VIT D DEFICIENCY, FRACTURES)
Vit D, 25-Hydroxy: 10.4 ng/mL — ABNORMAL LOW (ref 30.0–100.0)
Vit D, 25-Hydroxy: 10.5 ng/mL — ABNORMAL LOW (ref 30.0–100.0)

## 2018-05-09 MED ORDER — CALCITRIOL 0.25 MCG PO CAPS: 0.2500 ug | ORAL_CAPSULE | Freq: Every day | ORAL | Status: DC

## 2018-05-09 MED ORDER — CALCIUM CARBONATE 1250 (500 CA) MG PO TABS
1.0000 | ORAL_TABLET | Freq: Every day | ORAL | Status: DC
  Administered 2018-05-10 – 2018-05-13 (×4): 500 mg via ORAL
  Filled 2018-05-09 (×4): qty 1

## 2018-05-09 MED ORDER — VITAMIN D (ERGOCALCIFEROL) 1.25 MG (50000 UNIT) PO CAPS
50000.0000 [IU] | ORAL_CAPSULE | ORAL | Status: DC
  Administered 2018-05-09: 50000 [IU] via ORAL
  Filled 2018-05-09: qty 1

## 2018-05-09 MED ORDER — HEPARIN SODIUM (PORCINE) 5000 UNIT/ML IJ SOLN: 5000.0000 [IU] | Freq: Three times a day (TID) | INTRAMUSCULAR | Status: DC

## 2018-05-09 MED ORDER — ASPIRIN 325 MG PO TABS
325.0000 mg | ORAL_TABLET | Freq: Every day | ORAL | Status: DC
  Administered 2018-05-09 – 2018-05-13 (×5): 325 mg via ORAL
  Filled 2018-05-09 (×5): qty 1

## 2018-05-09 NOTE — Progress Notes (Signed)
Tried weaning patient off of 1L oxygen. Patients O2 dropped to 86-88. Patient currently on 0.5L with O2 sitting around 93.

## 2018-05-09 NOTE — Progress Notes (Signed)
   Subjective:  Patient reports pain as mild.  He has no specific complaints.  He denies chest pain, shortness of breath, night sweats or chills.  He has some pain with standing but has not attempted to walk yet.  Objective:   VITALS:   Vitals:   05/08/18 2144 05/09/18 0331 05/09/18 0857 05/09/18 1347  BP: (!) 100/50 (!) 100/49 (!) 100/49 (!) 100/55  Pulse: 75 87 87 71  Resp:  16  18  Temp:  98.5 F (36.9 C)  98.1 F (36.7 C)  TempSrc:  Oral  Oral  SpO2: 97% 91%  97%  Weight:      Height:        Neurologically intact Neurovascular intact Sensation intact distally Intact pulses distally Dorsiflexion/Plantar flexion intact Incision: dressing  Moderate saturation, but no active drainage. No pain with passive motion   Lab Results  Component Value Date   WBC 14.4 (H) 05/08/2018   HGB 13.0 05/08/2018   HCT 41.5 05/08/2018   MCV 90.8 05/08/2018   PLT 123 (L) 05/08/2018   BMET    Component Value Date/Time   NA 135 05/08/2018 0140   K 4.4 05/08/2018 0140   CL 100 05/08/2018 0140   CO2 24 05/08/2018 0140   GLUCOSE 105 (H) 05/08/2018 0140   BUN 18 05/08/2018 0140   CREATININE 1.30 (H) 05/08/2018 0140   CREATININE 1.34 10/25/2012 0729   CALCIUM 8.7 (L) 05/08/2018 0140   GFRNONAA 47 (L) 05/08/2018 0140   GFRAA 54 (L) 05/08/2018 0140     Assessment/Plan: 2 Days Post-Op   Active Problems:   Fracture   Closed displaced intertrochanteric fracture of left femur (HCC)   Coronary artery disease involving native heart without angina pectoris   Up with therapy -Weight-bear as tolerated to left lower extremity -Maintain bandages unless they become saturated at which point they can be changed to daily dry dressing changes. -For DVT prophylaxis mechanical SCDs in the hospital with daily 325mg  asa x 6 weeks at DC home -Return to see Dr. Stann Mainland in 2 to 3 weeks for wound check and staple removal.   Nicholes Stairs 05/09/2018, 4:38 PM   Geralynn Rile, MD [phone]

## 2018-05-09 NOTE — Progress Notes (Signed)
Family Medicine Teaching Service Daily Progress Note Intern Pager: [phone]  Patient name: Anthony Galvan Medical record number: [id number] Date of birth: 18-Jan-1922 Age: 85 y.o. Gender: male  Primary Care Provider: [name], MD Consultants: ortho Code Status: partial- wants chest compressions and/or intubation if necessary but does not want cardioversion or medication in event of cardiac arrest  Pt Overview and Major Events to Date:  1/21-admitted 1/21- left hip fracture fixation  Assessment and Plan: TREG DIEMER is a 85 y.o. male presenting with minimally-displaced L hip fracture . PMH is significant for CAD, GERD, HTN, HLD, h/o MI, osteoarthritis  L hip fracture- fixation 1/21. Pain is well controlled. He got tramadol once yesterday evening and once this morning. Patient is on regular diet. PT/OT evaluated and recommended SNF- CSW is already in process of placement for [facility]. He was able to get up to chair for ~30 minutes yesterday. Not continuing plavix. Currently on eliquis for DVT ppx. Hgb 13.0 yesterday. Patient required oxygen overnight for desaturation and is on 0.5L O2 currently saturating at 95%. Vitamin D severely low at 10.5. Meds: - scheduled tylenol - tramadol q6hr PRN - peri-operative ancef Labs: - CBC am Other: - vitals per floor protocol - fall precautions - recommend DEXA scan outpatient if not already performed. Consults: - ortho following - PT/OT following  HTN/CAD/h/o MI- Home meds: hydralazine 10mg  BID, HCTZ 25mg  daily, isosorbide dinitrate 30mg  TID, metoprolol 50mg  BID. BP stable on low side. 100/49 this morning. - hold home hydral and HCTZ - continue home metoprolol and isosorbide - hydralazine PRN for SBP >190, DBP>110  HLD- Home meds: atorvastatin 80mg  - continue home statin  Vitamin D deficiency- vitamin D is significantly low at 10.5, will supplement to help prevent future falls. -50,000 units weekly x8 weeks  (starting 1/23) and then a daily OTC supplement  GOC- would recommend documenting his desires for code status  FEN/GI: regular diet Prophylaxis: eliquis  Disposition: discharge pending SNF placement  Subjective:  Patient endorses well controlled pain. He is looking forward to going to rehab. He has no concerns or complaints today. He is amenable to vitamin D supplement.   Objective: Temp:  [97.7 F (36.5 C)-98.7 F (37.1 C)] 98.5 F (36.9 C) (01/23 0331) Pulse Rate:  [73-87] 87 (01/23 0331) Resp:  [14-22] 16 (01/23 0331) BP: (100-121)/(49-53) 100/49 (01/23 0331) SpO2:  [83 %-97 %] 91 % (01/23 0331) Physical Exam: General: well-appearing, alert, NAD Cardiovascular: RRR, 2/6 systolic murmur, pulses distal to surgery intact, well-perfused Respiratory: CTAB, no increased WOB. Sating >95% on ~0.5L O2 Abdomen: soft, non-tender, non-distended Extremities: able to minimally move left hip without pain. No hematoma, surgical incision dressing clean, dry, intact  Laboratory: Recent Labs  Lab 05/07/18 1153 05/08/18 0140  WBC 10.6* 14.4*  HGB 14.2 13.0  HCT 47.2 41.5  PLT 133* 123*   Recent Labs  Lab 05/07/18 1153 05/08/18 0140  NA 138 135  K 4.0 4.4  CL 101 100  CO2 25 24  BUN 19 18  CREATININE 1.41* 1.30*  CALCIUM 9.4 8.7*  GLUCOSE 99 105*   Vitamin D 10.5  Imaging/Diagnostic Tests: Dg Chest 2 View  Result Date: 05/07/2018 CLINICAL DATA:  Fall and LEFT hip fracture. EXAM: CHEST - 2 VIEW COMPARISON:  11/30/2016 and prior chest radiographs FINDINGS: UPPER limits normal heart size noted. Chronic interstitial prominence and scarring bilaterally again noted. No new pulmonary abnormalities are identified. There is no evidence of pneumothorax or definite pleural effusion. No acute  bony abnormalities are identified. IMPRESSION: 1. No evidence of acute abnormality. 2. Chronic interstitial prominence and pulmonary scarring. Electronically Signed   By: Margarette Canada M.D.   On:  05/07/2018 12:34   Dg C-arm 1-60 Min  Result Date: 05/07/2018 CLINICAL DATA:  Hip fracture EXAM: OPERATIVE left HIP (WITH PELVIS IF PERFORMED) 4 VIEWS TECHNIQUE: Fluoroscopic spot image(s) were submitted for interpretation post-operatively. COMPARISON:  05/07/2017 FINDINGS: Four low resolution intraoperative spot views of the left hip. Total fluoroscopy time was 59 seconds. The images demonstrate intramedullary rod and screw fixation of left intertrochanteric fracture. IMPRESSION: Intraoperative fluoroscopic assistance provided during surgical fixation of left femur fracture Electronically Signed   By: Donavan Foil M.D.   On: 05/07/2018 19:48   Dg Hip Operative Unilat W Or W/o Pelvis Left  Result Date: 05/07/2018 CLINICAL DATA:  Hip fracture EXAM: OPERATIVE left HIP (WITH PELVIS IF PERFORMED) 4 VIEWS TECHNIQUE: Fluoroscopic spot image(s) were submitted for interpretation post-operatively. COMPARISON:  05/07/2017 FINDINGS: Four low resolution intraoperative spot views of the left hip. Total fluoroscopy time was 59 seconds. The images demonstrate intramedullary rod and screw fixation of left intertrochanteric fracture. IMPRESSION: Intraoperative fluoroscopic assistance provided during surgical fixation of left femur fracture Electronically Signed   By: Donavan Foil M.D.   On: 05/07/2018 19:48   Dg Hip Unilat W Or Wo Pelvis 2-3 Views Left  Result Date: 05/07/2018 CLINICAL DATA:  Fall EXAM: DG HIP (WITH OR WITHOUT PELVIS) 2-3V LEFT COMPARISON:  06/30/2016 FINDINGS: There is a minimally displaced fracture through the intertrochanteric region of the left femur. There is cortical step-off just proximal to the lesser trochanter. An intramedullary rod and dynamic compression screw are present in the proximal right femur. Osteopenia. Mild degenerative change of the SI and hip joints. No breakage or loosening of the hardware. IMPRESSION: Acute minimally displaced left intertrochanteric femur fracture.  Electronically Signed   By: Marybelle Killings M.D.   On: 05/07/2018 12:33    Richarda Osmond, DO 05/09/2018, 7:44 AM PGY-1, [place] Intern pager: [phone], text pages welcome

## 2018-05-09 NOTE — Progress Notes (Signed)
FPTS Interim Progress Note  Spoke with Dr. Stann Mainland, orthopedics, regarding antiplatelet therapy. Per Dr. Stann Mainland it is ok to start ASA instead of plavix as last cardiology note from 02/2018 states ASA rather than plavix. Per Dr. Stann Mainland would like ASA 325mg  daily x6 weeks.  -start ASA 325mg  daily x6 weeks -will continue SCDs -dc eliquis -can consider heparin for dvt prophylaxis   Caroline More, DO 05/09/2018, 12:29 PM PGY-2, [place] Medicine Service pager [phone]

## 2018-05-09 NOTE — Plan of Care (Signed)
  Problem: Clinical Measurements: Goal: Will remain free from infection Outcome: Progressing   Problem: Activity: Goal: Risk for activity intolerance will decrease Outcome: Progressing   Problem: Safety: Goal: Ability to remain free from injury will improve Outcome: Progressing   

## 2018-05-10 DIAGNOSIS — Z419 Encounter for procedure for purposes other than remedying health state, unspecified: Secondary | ICD-10-CM

## 2018-05-10 LAB — CBC
HEMATOCRIT: 37 % — AB (ref 39.0–52.0)
Hemoglobin: 11.7 g/dL — ABNORMAL LOW (ref 13.0–17.0)
MCH: 28.8 pg (ref 26.0–34.0)
MCHC: 31.6 g/dL (ref 30.0–36.0)
MCV: 91.1 fL (ref 80.0–100.0)
Platelets: 104 10*3/uL — ABNORMAL LOW (ref 150–400)
RBC: 4.06 MIL/uL — ABNORMAL LOW (ref 4.22–5.81)
RDW: 11.4 % — ABNORMAL LOW (ref 11.5–15.5)
WBC: 9.7 10*3/uL (ref 4.0–10.5)
nRBC: 0 % (ref 0.0–0.2)

## 2018-05-10 LAB — BASIC METABOLIC PANEL
ANION GAP: 8 (ref 5–15)
BUN: 28 mg/dL — ABNORMAL HIGH (ref 8–23)
CO2: 27 mmol/L (ref 22–32)
Calcium: 8.6 mg/dL — ABNORMAL LOW (ref 8.9–10.3)
Chloride: 102 mmol/L (ref 98–111)
Creatinine, Ser: 1.64 mg/dL — ABNORMAL HIGH (ref 0.61–1.24)
GFR calc Af Amer: 41 mL/min — ABNORMAL LOW (ref >60)
GFR calc non Af Amer: 35 mL/min — ABNORMAL LOW (ref >60)
Glucose, Bld: 119 mg/dL — ABNORMAL HIGH (ref 70–99)
Potassium: 4.1 mmol/L (ref 3.5–5.1)
Sodium: 137 mmol/L (ref 135–145)

## 2018-05-10 MED ORDER — METOPROLOL TARTRATE 12.5 MG HALF TABLET
12.5000 mg | ORAL_TABLET | Freq: Two times a day (BID) | ORAL | Status: DC
  Administered 2018-05-10 – 2018-05-13 (×6): 12.5 mg via ORAL
  Filled 2018-05-10 (×6): qty 1

## 2018-05-10 NOTE — Progress Notes (Signed)
Family Medicine Teaching Service Daily Progress Note Intern Pager: [phone]  Patient name: Anthony Galvan Medical record number: [id number] Date of birth: [birth year] Age: 85 y.o. Gender: male  Primary Care Provider: [name], MD Consultants: ortho Code Status: partial- wants chest compressions and/or intubation if necessary but does not want cardioversion or medication in event of cardiac arrest  Pt Overview and Major Events to Date:  1/21-admitted 1/21- left hip fracture fixation  Assessment and Plan: Anthony Galvan is a 85 y.o. male presenting with minimally-displaced L hip fracture . PMH is significant for CAD, GERD, HTN, HLD, h/o MI, osteoarthritis  L hip fracture- s/p fixation 1/21. States he is not able to move his hip today. His bandage is partially saturated with dry blood but no signs of active bleeding. Denies pain. He got tramadol once yesterday. Patient is on regular diet. PT/OT evaluated and recommended SNF- CSW is already in process of placement for [facility].  Hgb decreased to 11.7 today. Patient was put on oxygen overnight. Removed oxygen during our conversation and he was able to talk with O2 saturation >94% so stayed off oxygen and notified nurse.  Meds: - scheduled tylenol - tramadol q6hr PRN Labs: - CBC am Other: - vitals per floor protocol - fall precautions Consults: - ortho following - PT/OT following - CSW for placement  HTN/CAD/h/o MI- Home meds: hydralazine 10mg  BID, HCTZ 25mg  daily, isosorbide dinitrate 30mg  TID, metoprolol 50mg  BID. BP stable on low side. 100/49 this morning. - hold home hydral and HCTZ - continue home metoprolol and isosorbide  HLD- Home meds: atorvastatin 80mg  - continue home statin  Vitamin D deficiency- vitamin D is significantly low at 10.5 -50,000 units weekly x8 weeks (starting 1/23) and then a daily OTC supplement - daily calcium supplement - DEXA scan and begin bisphosphonate in ~8 weeks  Anemia-  Hgb on admission was 14.2, hgb decreased to 11.7. EBL 1/21 200cc. Patient denies symptoms of CP, SOB, lightheadedness - continue to monitor CBC am  GOC- would recommend documenting his desires for code status  FEN/GI: regular diet Prophylaxis: eliquis  Disposition: discharge pending SNF placement  Subjective:  Patient endorses well controlled pain- no pain with palpation. States his incision sites are itchy. He is not able to move his hip in bed today. He is ready to go to SNF. No concerns or questions today. Denies SOB- does not know why he was put on oxygen overnight. Trialed him off the oxygen during exam and he was able to talk without desaturation so kept off oxygen and notified nurse.   Objective: Temp:  [97.7 F (36.5 C)-98.1 F (36.7 C)] 97.7 F (36.5 C) (01/24 0543) Pulse Rate:  [65-71] 66 (01/24 0543) Resp:  [14-18] 14 (01/24 0543) BP: (99-107)/(49-55) 107/54 (01/24 0543) SpO2:  [96 %-98 %] 98 % (01/24 0543) Physical Exam: General: well-appearing, alert, NAD Cardiovascular: RRR, 2/6 systolic murmur, pulses distal to surgery intact, well-perfused Respiratory: CTAB, no increased WOB. Sating >94% ora Abdomen: soft, non-tender, non-distended Extremities: minimally able to lift leg at thigh. No hematoma, surgical incision dressing has dried blood. Non-tender to palpation.   Laboratory: Recent Labs  Lab 05/07/18 1153 05/08/18 0140 05/10/18 0317  WBC 10.6* 14.4* 9.7  HGB 14.2 13.0 11.7*  HCT 47.2 41.5 37.0*  PLT 133* 123* 104*   Recent Labs  Lab 05/07/18 1153 05/08/18 0140  NA 138 135  K 4.0 4.4  CL 101 100  CO2 25 24  BUN 19 18  CREATININE 1.41*  1.30*  CALCIUM 9.4 8.7*  GLUCOSE 99 105*   Vitamin D 10.5  Imaging/Diagnostic Tests: Dg Chest 2 View  Result Date: 05/07/2018 CLINICAL DATA:  Fall and LEFT hip fracture. EXAM: CHEST - 2 VIEW COMPARISON:  11/30/2016 and prior chest radiographs FINDINGS: UPPER limits normal heart size noted. Chronic interstitial  prominence and scarring bilaterally again noted. No new pulmonary abnormalities are identified. There is no evidence of pneumothorax or definite pleural effusion. No acute bony abnormalities are identified. IMPRESSION: 1. No evidence of acute abnormality. 2. Chronic interstitial prominence and pulmonary scarring. Electronically Signed   By: Margarette Canada M.D.   On: 05/07/2018 12:34   Dg C-arm 1-60 Min  Result Date: 05/07/2018 CLINICAL DATA:  Hip fracture EXAM: OPERATIVE left HIP (WITH PELVIS IF PERFORMED) 4 VIEWS TECHNIQUE: Fluoroscopic spot image(s) were submitted for interpretation post-operatively. COMPARISON:  05/07/2017 FINDINGS: Four low resolution intraoperative spot views of the left hip. Total fluoroscopy time was 59 seconds. The images demonstrate intramedullary rod and screw fixation of left intertrochanteric fracture. IMPRESSION: Intraoperative fluoroscopic assistance provided during surgical fixation of left femur fracture Electronically Signed   By: Donavan Foil M.D.   On: 05/07/2018 19:48   Dg Hip Operative Unilat W Or W/o Pelvis Left  Result Date: 05/07/2018 CLINICAL DATA:  Hip fracture EXAM: OPERATIVE left HIP (WITH PELVIS IF PERFORMED) 4 VIEWS TECHNIQUE: Fluoroscopic spot image(s) were submitted for interpretation post-operatively. COMPARISON:  05/07/2017 FINDINGS: Four low resolution intraoperative spot views of the left hip. Total fluoroscopy time was 59 seconds. The images demonstrate intramedullary rod and screw fixation of left intertrochanteric fracture. IMPRESSION: Intraoperative fluoroscopic assistance provided during surgical fixation of left femur fracture Electronically Signed   By: Donavan Foil M.D.   On: 05/07/2018 19:48   Dg Hip Unilat W Or Wo Pelvis 2-3 Views Left  Result Date: 05/07/2018 CLINICAL DATA:  Fall EXAM: DG HIP (WITH OR WITHOUT PELVIS) 2-3V LEFT COMPARISON:  06/30/2016 FINDINGS: There is a minimally displaced fracture through the intertrochanteric region of the  left femur. There is cortical step-off just proximal to the lesser trochanter. An intramedullary rod and dynamic compression screw are present in the proximal right femur. Osteopenia. Mild degenerative change of the SI and hip joints. No breakage or loosening of the hardware. IMPRESSION: Acute minimally displaced left intertrochanteric femur fracture. Electronically Signed   By: Marybelle Killings M.D.   On: 05/07/2018 12:33    Richarda Osmond, DO 05/10/2018, 9:11 AM PGY-1, [place] Intern pager: [phone], text pages welcome

## 2018-05-10 NOTE — Care Management Important Message (Signed)
Important Message  Patient Details  Name: Anthony Galvan MRN: [id number] Date of Birth: Apr 19, 1921   Medicare Important Message Given:  Yes    Kista Robb P [place] 05/10/2018, 11:23 AM

## 2018-05-10 NOTE — Progress Notes (Signed)
Physical Therapy Treatment Patient Details Name: Anthony Galvan MRN: [id number] DOB: 03/01/1922 Today's Date: 05/10/2018    History of Present Illness Pt is a 85 y.o. male who sustained L hip fx when [place]. He underwent IM nail 05/07/18. PMH consists of OA, CAD, previous MI and R hip fx (11/2016).     PT Comments    Pt received in bed agreeable to participation in therapy. Pt with c/o increased pain and decreased ability to move LLE today. He required max assist bed mobility, +2 mod assist sit to stand, and +2 mod assist ambulation 3 feet with RW. Gait distance limited by pain and fatigue. Pt performed LLE exercises in recliner with feet elevated at end of session.    Follow Up Recommendations  SNF([facility])     Equipment Recommendations  None recommended by PT    Recommendations for Other Services       Precautions / Restrictions Precautions Precautions: Fall Restrictions LLE Weight Bearing: Weight bearing as tolerated    Mobility  Bed Mobility Overal bed mobility: Needs Assistance Bed Mobility: Supine to Sit     Supine to sit: Max assist;HOB elevated     General bed mobility comments: +rail, use of bed pad to scoot to EOB  Transfers Overall transfer level: Needs assistance Equipment used: Rolling walker (2 wheeled) Transfers: Sit to/from Stand Sit to Stand: Mod assist         General transfer comment: cues for hand placement, assist to power up and stabilize initial standing balance  Ambulation/Gait Ambulation/Gait assistance: Mod assist;+2 physical assistance Gait Distance (Feet): 3 Feet Assistive device: Rolling walker (2 wheeled) Gait Pattern/deviations: Step-to pattern;Antalgic;Trunk flexed Gait velocity: decreased Gait velocity interpretation: <1.8 ft/sec, indicate of risk for recurrent falls General Gait Details: cues for sequencing, assist to maintain balance and progress LLE through swing phase.  Difficulty with weight shift over LLE due to pain.   Stairs             Wheelchair Mobility    Modified Rankin (Stroke Patients Only)       Balance                                            Cognition Arousal/Alertness: Awake/alert Behavior During Therapy: WFL for tasks assessed/performed Overall Cognitive Status: Within Functional Limits for tasks assessed                                        Exercises General Exercises - Lower Extremity Ankle Circles/Pumps: AROM;Both;20 reps Heel Slides: AAROM;Left;10 reps Hip ABduction/ADduction: AAROM;Left;10 reps    General Comments General comments (skin integrity, edema, etc.): SpO2 88-91% on RA      Pertinent Vitals/Pain Pain Assessment: 0-10 Pain Score: 8  Pain Location: LLE during mobility Pain Descriptors / Indicators: Sore;Grimacing;Guarding Pain Intervention(s): Monitored during session;Repositioned;Patient requesting pain meds-RN notified    Home Living                      Prior Function            PT Goals (current goals can now be found in the care plan section) Acute Rehab PT Goals Patient Stated Goal: get better PT Goal Formulation: With patient  Time For Goal Achievement: 05/22/18 Potential to Achieve Goals: Good Progress towards PT goals: Progressing toward goals    Frequency    Min 3X/week      PT Plan Current plan remains appropriate    Co-evaluation              AM-PAC PT "6 Clicks" Mobility   Outcome Measure  Help needed turning from your back to your side while in a flat bed without using bedrails?: A Lot Help needed moving from lying on your back to sitting on the side of a flat bed without using bedrails?: A Lot Help needed moving to and from a bed to a chair (including a wheelchair)?: A Lot Help needed standing up from a chair using your arms (e.g., wheelchair or bedside chair)?: A Lot Help needed to walk in hospital room?: A  Lot Help needed climbing 3-5 steps with a railing? : Total 6 Click Score: 11    End of Session Equipment Utilized During Treatment: Gait belt Activity Tolerance: Patient limited by pain Patient left: in chair;with chair alarm set;with call bell/phone within reach Nurse Communication: Mobility status PT Visit Diagnosis: Other abnormalities of gait and mobility (R26.89);Difficulty in walking, not elsewhere classified (R26.2);History of falling (Z91.81)     Time: 4403-4742 PT Time Calculation (min) (ACUTE ONLY): 17 min  Charges:  $Gait Training: 8-22 mins                     Lorrin Goodell, PT  Office # [phone] Pager [phone]    Lorriane Shire 05/10/2018, 11:08 AM

## 2018-05-11 LAB — CBC
HCT: 35.4 % — ABNORMAL LOW (ref 39.0–52.0)
HEMOGLOBIN: 11.3 g/dL — AB (ref 13.0–17.0)
MCH: 29 pg (ref 26.0–34.0)
MCHC: 31.9 g/dL (ref 30.0–36.0)
MCV: 91 fL (ref 80.0–100.0)
Platelets: 112 10*3/uL — ABNORMAL LOW (ref 150–400)
RBC: 3.89 MIL/uL — ABNORMAL LOW (ref 4.22–5.81)
RDW: 11.5 % (ref 11.5–15.5)
WBC: 10.9 10*3/uL — ABNORMAL HIGH (ref 4.0–10.5)
nRBC: 0 % (ref 0.0–0.2)

## 2018-05-11 NOTE — Progress Notes (Signed)
Subjective: 4 Days Post-Op Procedure(s) (LRB): INTRAMEDULLARY (IM) NAIL FEMORAL (Left) for left hip fx. Patient reports pain as 0 on 0-10 scale.  Patient is tolerating PO. Patient is voiding and passing gas. No BM in a few days. Denies nausea/vomiting, SOB, fevers/sweats/chills.   Objective: Vital signs in last 24 hours: Temp:  [97.7 F (36.5 C)-98.6 F (37 C)] 98.1 F (36.7 C) (01/25 0401) Pulse Rate:  [74-90] 90 (01/25 0401) Resp:  [16] 16 (01/25 0401) BP: (100-124)/(54-62) 124/62 (01/25 0401) SpO2:  [90 %-93 %] 93 % (01/25 0401)  Intake/Output from previous day: 01/24 0701 - 01/25 0700 In: 720 [P.O.:720] Out: 250 [Urine:250] Intake/Output this shift: No intake/output data recorded.  Recent Labs    05/10/18 0317 05/11/18 0523  HGB 11.7* 11.3*   Recent Labs    05/10/18 0317 05/11/18 0523  WBC 9.7 10.9*  RBC 4.06* 3.89*  HCT 37.0* 35.4*  PLT 104* 112*   Recent Labs    05/10/18 1033  NA 137  K 4.1  CL 102  CO2 27  BUN 28*  CREATININE 1.64*  GLUCOSE 119*  CALCIUM 8.6*   No results for input(s): LABPT, INR in the last 72 hours.  Neurologically intact Neurovascular intact Sensation intact distally Intact pulses distally Dorsiflexion/Plantar flexion intact Incision: moderate drainage No cellulitis present Compartment soft    Assessment/Plan: 4 Days Post-Op Procedure(s) (LRB): INTRAMEDULLARY (IM) NAIL FEMORAL (Left) Up with therapy Discharge to SNF  WBAT ASA 325 with Teds and SCDs for DVT prophylaxis Will change to fresh dressings.  F/u in 2-3 weeks with Dr. Stann Mainland.     Yvonne Kendall Ward 05/11/2018, 8:48 AM

## 2018-05-11 NOTE — Progress Notes (Addendum)
Family Medicine Teaching Service Daily Progress Note Intern Pager: [phone]  Patient name: Anthony Galvan Medical record number: [id number] Date of birth: 1921/11/22 Age: 85 y.o. Gender: male  Primary Care Provider: [name], MD Consultants: ortho Code Status: partial- wants chest compressions and/or intubation if necessary but does not want cardioversion or medication in event of cardiac arrest  Pt Overview and Major Events to Date:  1/21-admitted 1/21- left hip fracture fixation  Assessment and Plan: Anthony Galvan is a 85 y.o. male presenting with minimally-displaced L hip fracture . PMH is significant for CAD, GERD, HTN, HLD, h/o MI, osteoarthritis  L hip fracture- POD4 s/p fixation 1/21. PT/OT evaluated and recommended SNF- CSW is already in process of placement for [facility].  Hgb stable 11.3 today. Patient remained on room air overnight. Tramadol given x2 yesterday. He states his pain is well controlled today. Able to move knee and below but weakness at hip not able to lift. On inspection, the dressing appears to be saturated- consulted nurse to change dressing. He stated the dressing is itchy so I recommended he wash off the iodine on his skin from surgery and change the dressing first to see if that will help and if not, I can try to give him something for the itchiness.  Meds: - scheduled tylenol - tramadol q6hr PRN Labs: - CBC am Other: - vitals per floor protocol - fall precautions - dressing change Consults: - ortho following - PT/OT following - CSW for placement  HTN/CAD/h/o MI- Home meds: hydralazine 10mg  BID, HCTZ 25mg  daily, isosorbide dinitrate 30mg  TID, metoprolol 50mg  BID. BP stable on low side. 124/62 this morning. - hold home hydral and HCTZ - decreased home metoprolol to 12.5mg  BID - continue home isosorbide daily  HLD- Home meds: atorvastatin 80mg  - continue home statin  Vitamin D deficiency- vitamin D is significantly low at  10.5 -50,000 units weekly x8 weeks (starting 1/23) and then a daily OTC supplement - daily calcium supplement - DEXA scan and begin bisphosphonate in ~8 weeks  Anemia- Hgb on admission was 14.2, hgb decreased to 11.3 (stable). EBL 1/21 200cc. Patient denies symptoms of CP, SOB, lightheadedness - continue to monitor CBC am  GOC- recommend documenting his desires for code status  FEN/GI: regular diet Prophylaxis: ASA 325mg  daily and SCDs  Disposition: discharge pending SNF placement  Subjective:  Denies hip pain. On room air overnight. Denies SOB. Patient is bathing this morning in bed. Cannot lift his hip. Complains of itchiness at dressing edges which is saturated. Ordered dressing change and recommend he wash off the residual iodine on his thigh from surgery. Requested he follow up this afternoon if still itchy.  Objective: Temp:  [97.7 F (36.5 C)-98.6 F (37 C)] 98.1 F (36.7 C) (01/25 0401) Pulse Rate:  [74-90] 90 (01/25 0401) Resp:  [16] 16 (01/25 0401) BP: (100-124)/(54-62) 124/62 (01/25 0401) SpO2:  [90 %-93 %] 93 % (01/25 0401) Physical Exam: General: well-appearing, alert, NAD Cardiovascular: RRR, 2/6 systolic murmur, pulses distal to surgery intact, well-perfused Respiratory: CTAB, no increased WOB. Sating >94% ora Abdomen: soft, non-tender, non-distended Extremities: minimally able to lift leg at thigh. No hematoma, surgical incision dressing saturated. Non-tender to palpation.   Laboratory: Recent Labs  Lab 05/08/18 0140 05/10/18 0317 05/11/18 0523  WBC 14.4* 9.7 10.9*  HGB 13.0 11.7* 11.3*  HCT 41.5 37.0* 35.4*  PLT 123* 104* 112*   Recent Labs  Lab 05/07/18 1153 05/08/18 0140 05/10/18 1033  NA 138 135 137  K 4.0 4.4 4.1  CL 101 100 102  CO2 25 24 27   BUN 19 18 28*  CREATININE 1.41* 1.30* 1.64*  CALCIUM 9.4 8.7* 8.6*  GLUCOSE 99 105* 119*   Vitamin D 10.5  Imaging/Diagnostic Tests: Dg Chest 2 View  Result Date: 05/07/2018 CLINICAL DATA:   Fall and LEFT hip fracture. EXAM: CHEST - 2 VIEW COMPARISON:  11/30/2016 and prior chest radiographs FINDINGS: UPPER limits normal heart size noted. Chronic interstitial prominence and scarring bilaterally again noted. No new pulmonary abnormalities are identified. There is no evidence of pneumothorax or definite pleural effusion. No acute bony abnormalities are identified. IMPRESSION: 1. No evidence of acute abnormality. 2. Chronic interstitial prominence and pulmonary scarring. Electronically Signed   By: Margarette Canada M.D.   On: 05/07/2018 12:34   Dg C-arm 1-60 Min  Result Date: 05/07/2018 CLINICAL DATA:  Hip fracture EXAM: OPERATIVE left HIP (WITH PELVIS IF PERFORMED) 4 VIEWS TECHNIQUE: Fluoroscopic spot image(s) were submitted for interpretation post-operatively. COMPARISON:  05/07/2017 FINDINGS: Four low resolution intraoperative spot views of the left hip. Total fluoroscopy time was 59 seconds. The images demonstrate intramedullary rod and screw fixation of left intertrochanteric fracture. IMPRESSION: Intraoperative fluoroscopic assistance provided during surgical fixation of left femur fracture Electronically Signed   By: Donavan Foil M.D.   On: 05/07/2018 19:48   Dg Hip Operative Unilat W Or W/o Pelvis Left  Result Date: 05/07/2018 CLINICAL DATA:  Hip fracture EXAM: OPERATIVE left HIP (WITH PELVIS IF PERFORMED) 4 VIEWS TECHNIQUE: Fluoroscopic spot image(s) were submitted for interpretation post-operatively. COMPARISON:  05/07/2017 FINDINGS: Four low resolution intraoperative spot views of the left hip. Total fluoroscopy time was 59 seconds. The images demonstrate intramedullary rod and screw fixation of left intertrochanteric fracture. IMPRESSION: Intraoperative fluoroscopic assistance provided during surgical fixation of left femur fracture Electronically Signed   By: Donavan Foil M.D.   On: 05/07/2018 19:48   Dg Hip Unilat W Or Wo Pelvis 2-3 Views Left  Result Date: 05/07/2018 CLINICAL DATA:   Fall EXAM: DG HIP (WITH OR WITHOUT PELVIS) 2-3V LEFT COMPARISON:  06/30/2016 FINDINGS: There is a minimally displaced fracture through the intertrochanteric region of the left femur. There is cortical step-off just proximal to the lesser trochanter. An intramedullary rod and dynamic compression screw are present in the proximal right femur. Osteopenia. Mild degenerative change of the SI and hip joints. No breakage or loosening of the hardware. IMPRESSION: Acute minimally displaced left intertrochanteric femur fracture. Electronically Signed   By: Marybelle Killings M.D.   On: 05/07/2018 12:33    Richarda Osmond, DO 05/11/2018, 8:26 AM PGY-1, [place] Intern pager: [phone], text pages welcome

## 2018-05-11 NOTE — Plan of Care (Signed)
  Problem: Pain Managment: Goal: General experience of comfort will improve Outcome: Progressing   Problem: Safety: Goal: Ability to remain free from injury will improve Outcome: Progressing   Problem: Skin Integrity: Goal: Risk for impaired skin integrity will decrease Outcome: Progressing   

## 2018-05-12 LAB — CBC
HCT: 37.7 % — ABNORMAL LOW (ref 39.0–52.0)
Hemoglobin: 11.6 g/dL — ABNORMAL LOW (ref 13.0–17.0)
MCH: 28.1 pg (ref 26.0–34.0)
MCHC: 30.8 g/dL (ref 30.0–36.0)
MCV: 91.3 fL (ref 80.0–100.0)
Platelets: 133 10*3/uL — ABNORMAL LOW (ref 150–400)
RBC: 4.13 MIL/uL — ABNORMAL LOW (ref 4.22–5.81)
RDW: 11.4 % — ABNORMAL LOW (ref 11.5–15.5)
WBC: 9.9 10*3/uL (ref 4.0–10.5)
nRBC: 0 % (ref 0.0–0.2)

## 2018-05-12 MED ORDER — SENNOSIDES-DOCUSATE SODIUM 8.6-50 MG PO TABS
1.0000 | ORAL_TABLET | Freq: Two times a day (BID) | ORAL | Status: DC
  Administered 2018-05-12 (×2): 1 via ORAL
  Filled 2018-05-12 (×2): qty 1

## 2018-05-12 MED ORDER — POLYETHYLENE GLYCOL 3350 17 G PO PACK
17.0000 g | PACK | Freq: Two times a day (BID) | ORAL | Status: DC
  Administered 2018-05-12: 17 g via ORAL
  Filled 2018-05-12: qty 1

## 2018-05-12 NOTE — Anesthesia Postprocedure Evaluation (Signed)
Anesthesia Post Note  Patient: Anthony Galvan  Procedure(s) Performed: INTRAMEDULLARY (IM) NAIL FEMORAL (Left Leg Upper)     Patient location during evaluation: PACU Anesthesia Type: General Level of consciousness: awake and alert Pain management: pain level controlled Vital Signs Assessment: post-procedure vital signs reviewed and stable Respiratory status: spontaneous breathing, nonlabored ventilation, respiratory function stable and patient connected to nasal cannula oxygen Cardiovascular status: blood pressure returned to baseline and stable Postop Assessment: no apparent nausea or vomiting Anesthetic complications: no    Last Vitals:  Vitals:   05/12/18 0447 05/12/18 1246  BP: 127/71 134/63  Pulse: 74 72  Resp: 16   Temp: 36.8 C 36.8 C  SpO2: 90% 92%    Last Pain:  Vitals:   05/12/18 2000  TempSrc:   PainSc: 0-No pain                 Teodoro Jeffreys DAVID

## 2018-05-12 NOTE — Progress Notes (Signed)
Family Medicine Teaching Service Daily Progress Note Intern Pager: [phone]  Patient name: Anthony Galvan Medical record number: [id number] Date of birth: 08-28-1921 Age: 85 y.o. Gender: male  Primary Care Provider: [name], MD Consultants: ortho Code Status: partial- wants chest compressions and/or intubation if necessary but does not want cardioversion or medication in event of cardiac arrest  Pt Overview and Major Events to Date:  1/21-admitted 1/21- left hip fracture fixation  Assessment and Plan: Anthony Galvan is a 85 y.o. male presenting with minimally-displaced L hip fracture . PMH is significant for CAD, GERD, HTN, HLD, h/o MI, osteoarthritis  L hip fracture- POD5 s/p fixation 1/21. PT/OT evaluated and recommended SNF- CSW is already in process of placement for [facility].  Hgb stable 11.3 yesterday, pending today. Patient remained on room air overnight.  He states his pain is well controlled today and his itchiness is improved. Patient has been able to work with PT/OT Meds: - scheduled tylenol - tramadol q6hr PRN Labs: - CBC am Other: - vitals per floor protocol - fall precautions - dressing change Consults: - ortho following - PT/OT following - CSW for placement  HTN/CAD/h/o MI- SBP 107-140 and DBP 65-71 past 24hrsHome meds: hydralazine 10mg  BID, HCTZ 25mg  daily, isosorbide dinitrate 30mg  TID, metoprolol 50mg  BID. BP stable on low side. 124/62 this morning. - hold home hydral and HCTZ - decreased home metoprolol to 12.5mg  BID - continue home isosorbide daily  HLD- Home meds: atorvastatin 80mg  - continue home statin  Vitamin D deficiency- vitamin D is significantly low at 10.5 -50,000 units weekly x8 weeks (starting 1/23) and then a daily OTC supplement - daily calcium supplement - DEXA scan and begin bisphosphonate in ~8 weeks  Anemia- Stable. Pending this am. Hgb on admission was 14.2. EBL 1/21 200cc. Patient denies symptoms of CP, SOB,  lightheadedness - continue to monitor CBC am  GOC- recommend documenting his desires for code status   FEN/GI: regular diet, miralax and senna docusate Prophylaxis: ASA 325mg  daily and SCDs  Disposition: discharge pending SNF placement  Subjective:  This am patient states he is doing well and is not in any pain. He is resting comfortably in bed eating his breakfast this am. He states he has not had a BM since Monday but is not having any abdominal pain, n/v. Since the dressing on his right hip was changed he has no loner had any itching.  Objective: Temp:  [97.7 F (36.5 C)-98.3 F (36.8 C)] 98.3 F (36.8 C) (01/26 0447) Pulse Rate:  [74-98] 74 (01/26 0447) Resp:  [16] 16 (01/26 0447) BP: (107-140)/(65-71) 127/71 (01/26 0447) SpO2:  [90 %-93 %] 90 % (01/26 0447) Physical Exam: General: elderly male, NAD Cardiovascular: RRR, 2/6 systolic murmur, +2 distal pulses bilaterally Respiratory: CTAB, no wheezing, no rales, no rhonic, NWOB Abdomen: soft, non-tender, non-distended Extremities: non-tender to palpation, new dressing in place, no drainage, no surrounding erythema  Laboratory: Recent Labs  Lab 05/08/18 0140 05/10/18 0317 05/11/18 0523  WBC 14.4* 9.7 10.9*  HGB 13.0 11.7* 11.3*  HCT 41.5 37.0* 35.4*  PLT 123* 104* 112*   Recent Labs  Lab 05/07/18 1153 05/08/18 0140 05/10/18 1033  NA 138 135 137  K 4.0 4.4 4.1  CL 101 100 102  CO2 25 24 27   BUN 19 18 28*  CREATININE 1.41* 1.30* 1.64*  CALCIUM 9.4 8.7* 8.6*  GLUCOSE 99 105* 119*   Vitamin D 10.5  Imaging/Diagnostic Tests: No results found.  Nuala Alpha, DO  05/12/2018, 6:35 AM PGY-2, [place] Intern pager: [phone], text pages welcome

## 2018-05-12 NOTE — Progress Notes (Addendum)
Family Medicine Teaching Service Daily Progress Note Intern Pager: [phone]  Patient name: Anthony Galvan Medical record number: [id number] Date of birth: 22-Apr-1921 Age: 85 y.o. Gender: male  Primary Care Provider: [name], MD Consultants: Orthopedics Code Status: partial- wants chest compressions and/or intubation if necessary but does not want cardioversion or medication in event of cardiac arrest  Pt Overview and Major Events to Date:  1/21-admitted 1/21- left hip fracture fixation  Assessment and Plan: Anthony Suman Tuckeris a 85 y.o.malepresenting with minimally-displaced L hip fracture. PMH is significant for CAD,GERD, HTN, HLD, h/o MI, osteoarthritis  L hip fracture s/p fixation-stable Patient doing well POD6, stable on room air. Pain is well controlled on scheduled tylenol, without need for PRN tramadol. Hgb stable at 11. Working with PT/OT. Awaiting SNF placement to [facility].  - follow up with CSW   - Per ortho, continue ASA, stable for discharge, - continue tylenol, tramadol q6 PRN - f/u CBC AM - fall precautions - dressing change - f/u CSW for placement   HTN/CAD/History of MI:  Hydralazine and HCTZ held and Metoprolol decreased to 12.5mg  due to soft BP's. BP ON 135-154 SBP, 154/72 this AM.  Home meds: hydralazine10mg  BID, HCTZ 25mg  daily, isosorbide dinitrate 30mg  TID, metoprolol 50mg  BID, Valsartan 320 QD. - hold home hydral and HCTZ - continue metoprolol 12.5mg  BID - continue home isosorbide daily  HLD-Home meds: atorvastatin 80mg  - continue home statin  Vitamin D Deficiency-  - Continue 50,000 units weekly x8 weeks (starting 1/23) and then a daily OTC supplement - daily calcium supplement - DEXA scan and begin bisphosphonate in ~8 weeks  Anemia- Hgb stable at 11. Denies symptoms of CP, SOB, lightheadedness - continue to monitor AM CBC  FEN/GI: regular diet, miralax and senna docusate PPx: ASA 325mg  daily and SCDs  Disposition:  discharge pending SNF placement  Subjective:  Doing very well this morning. Has no complaints or concerns. Denies any pain this morning and thinks he doing great.   Objective: Temp:  [98 F (36.7 C)-98.2 F (36.8 C)] 98 F (36.7 C) (01/27 0355) Pulse Rate:  [72] 72 (01/27 0355) Resp:  [16] 16 (01/27 0355) BP: (134-154)/(63-79) 154/72 (01/27 0355) SpO2:  [92 %] 92 % (01/27 0355) Physical Exam: General: well nourished, well developed, in no acute distress with non-toxic appearance, lying comfortably in bed HEENT: normocephalic, atraumatic, moist mucous membranes CV: regular rate and rhythm without murmurs, rubs, or gallops, no lower extremity edema, 2+ radial and pedal pulses bilaterally Lungs: clear to auscultation bilaterally with normal work of breathing Abdomen: soft, non-tender, non-distended, normoactive bowel sounds Skin: warm, dry, no rashes or lesions Extremities: warm and well perfused, normal tone MSK: non-tender to palpation Neuro: Alert and oriented, speech normal  Laboratory: Recent Labs  Lab 05/11/18 0523 05/12/18 0640 05/13/18 0420  WBC 10.9* 9.9 9.5  HGB 11.3* 11.6* 11.5*  HCT 35.4* 37.7* 37.7*  PLT 112* 133* 138*   Recent Labs  Lab 05/08/18 0140 05/10/18 1033 05/13/18 0420  NA 135 137 135  K 4.4 4.1 4.7  CL 100 102 98  CO2 24 27 26   BUN 18 28* 29*  CREATININE 1.30* 1.64* 1.34*  CALCIUM 8.7* 8.6* 8.6*  GLUCOSE 105* 119* 98   Vitamin D 10.5  Imaging/Diagnostic Tests: No results found.  Mina Marble Bud, DO 05/13/2018, 9:41 AM PGY-1, [place] Intern pager: [phone], text pages welcome

## 2018-05-12 NOTE — Progress Notes (Signed)
Subjective: 5 Days Post-Op Procedure(s) (LRB): INTRAMEDULLARY (IM) NAIL FEMORAL (Left) Patient reports pain as mild.  Reports more comfortable today.  No other c/o.  Objective: Vital signs in last 24 hours: Temp:  [97.7 F (36.5 C)-98.3 F (36.8 C)] 98.3 F (36.8 C) (01/26 0447) Pulse Rate:  [74-98] 74 (01/26 0447) Resp:  [16] 16 (01/26 0447) BP: (107-140)/(65-71) 127/71 (01/26 0447) SpO2:  [90 %-93 %] 90 % (01/26 0447)  Intake/Output from previous day: 01/25 0701 - 01/26 0700 In: 920 [P.O.:920] Out: 650 [Urine:650] Intake/Output this shift: No intake/output data recorded.  Recent Labs    05/10/18 0317 05/11/18 0523  HGB 11.7* 11.3*   Recent Labs    05/10/18 0317 05/11/18 0523  WBC 9.7 10.9*  RBC 4.06* 3.89*  HCT 37.0* 35.4*  PLT 104* 112*   Recent Labs    05/10/18 1033  NA 137  K 4.1  CL 102  CO2 27  BUN 28*  CREATININE 1.64*  GLUCOSE 119*  CALCIUM 8.6*   No results for input(s): LABPT, INR in the last 72 hours.  Neurologically intact ABD soft Neurovascular intact Sensation intact distally Intact pulses distally Dorsiflexion/Plantar flexion intact Incision: dressing C/D/I and no drainage No cellulitis present Compartment soft no calf pain or sign of DVT   Assessment/Plan: 5 Days Post-Op Procedure(s) (LRB): INTRAMEDULLARY (IM) NAIL FEMORAL (Left) Advance diet Up with therapy D/C IV fluids Continue ASA and SCDs for DVT ppx WBAT LLE Awaiting D/C, stable from ortho standpoint when ready  Cecilie Kicks 05/12/2018, 8:10 AM

## 2018-05-13 ENCOUNTER — Inpatient Hospital Stay
Admission: RE | Admit: 2018-05-13 | Discharge: 2018-05-25 | Disposition: A | Discharge status: Home / Self Care | Payer: Medicare Other | Source: Ambulatory Visit | Attending: Internal Medicine | Admitting: Internal Medicine

## 2018-05-13 DIAGNOSIS — Z419 Encounter for procedure for purposes other than remedying health state, unspecified: Secondary | ICD-10-CM | POA: Diagnosis not present

## 2018-05-13 DIAGNOSIS — Z741 Need for assistance with personal care: Secondary | ICD-10-CM | POA: Diagnosis not present

## 2018-05-13 DIAGNOSIS — S72141K Displaced intertrochanteric fracture of right femur, subsequent encounter for closed fracture with nonunion: Secondary | ICD-10-CM | POA: Diagnosis not present

## 2018-05-13 DIAGNOSIS — E785 Hyperlipidemia, unspecified: Secondary | ICD-10-CM | POA: Diagnosis not present

## 2018-05-13 DIAGNOSIS — M1990 Unspecified osteoarthritis, unspecified site: Secondary | ICD-10-CM | POA: Diagnosis not present

## 2018-05-13 DIAGNOSIS — E559 Vitamin D deficiency, unspecified: Secondary | ICD-10-CM | POA: Diagnosis not present

## 2018-05-13 DIAGNOSIS — I2510 Atherosclerotic heart disease of native coronary artery without angina pectoris: Secondary | ICD-10-CM | POA: Diagnosis not present

## 2018-05-13 DIAGNOSIS — K219 Gastro-esophageal reflux disease without esophagitis: Secondary | ICD-10-CM | POA: Diagnosis not present

## 2018-05-13 DIAGNOSIS — R29898 Other symptoms and signs involving the musculoskeletal system: Secondary | ICD-10-CM | POA: Diagnosis not present

## 2018-05-13 DIAGNOSIS — Z955 Presence of coronary angioplasty implant and graft: Secondary | ICD-10-CM | POA: Diagnosis not present

## 2018-05-13 DIAGNOSIS — S72142A Displaced intertrochanteric fracture of left femur, initial encounter for closed fracture: Secondary | ICD-10-CM | POA: Diagnosis not present

## 2018-05-13 DIAGNOSIS — M6281 Muscle weakness (generalized): Secondary | ICD-10-CM | POA: Diagnosis not present

## 2018-05-13 DIAGNOSIS — I252 Old myocardial infarction: Secondary | ICD-10-CM | POA: Diagnosis not present

## 2018-05-13 DIAGNOSIS — Z7401 Bed confinement status: Secondary | ICD-10-CM | POA: Diagnosis not present

## 2018-05-13 DIAGNOSIS — S72142S Displaced intertrochanteric fracture of left femur, sequela: Secondary | ICD-10-CM | POA: Diagnosis not present

## 2018-05-13 DIAGNOSIS — R0602 Shortness of breath: Secondary | ICD-10-CM | POA: Diagnosis not present

## 2018-05-13 DIAGNOSIS — N183 Chronic kidney disease, stage 3 (moderate): Secondary | ICD-10-CM | POA: Diagnosis not present

## 2018-05-13 DIAGNOSIS — Z9181 History of falling: Secondary | ICD-10-CM | POA: Diagnosis not present

## 2018-05-13 DIAGNOSIS — S72142D Displaced intertrochanteric fracture of left femur, subsequent encounter for closed fracture with routine healing: Secondary | ICD-10-CM | POA: Diagnosis not present

## 2018-05-13 DIAGNOSIS — M2550 Pain in unspecified joint: Secondary | ICD-10-CM | POA: Diagnosis not present

## 2018-05-13 DIAGNOSIS — Z4789 Encounter for other orthopedic aftercare: Secondary | ICD-10-CM | POA: Diagnosis not present

## 2018-05-13 DIAGNOSIS — R262 Difficulty in walking, not elsewhere classified: Secondary | ICD-10-CM | POA: Diagnosis not present

## 2018-05-13 DIAGNOSIS — I10 Essential (primary) hypertension: Secondary | ICD-10-CM | POA: Diagnosis not present

## 2018-05-13 LAB — BASIC METABOLIC PANEL
Anion gap: 11 (ref 5–15)
BUN: 29 mg/dL — ABNORMAL HIGH (ref 8–23)
CO2: 26 mmol/L (ref 22–32)
Calcium: 8.6 mg/dL — ABNORMAL LOW (ref 8.9–10.3)
Chloride: 98 mmol/L (ref 98–111)
Creatinine, Ser: 1.34 mg/dL — ABNORMAL HIGH (ref 0.61–1.24)
GFR calc Af Amer: 52 mL/min — ABNORMAL LOW (ref >60)
GFR calc non Af Amer: 45 mL/min — ABNORMAL LOW (ref >60)
Glucose, Bld: 98 mg/dL (ref 70–99)
Potassium: 4.7 mmol/L (ref 3.5–5.1)
Sodium: 135 mmol/L (ref 135–145)

## 2018-05-13 LAB — CBC
HEMATOCRIT: 37.7 % — AB (ref 39.0–52.0)
Hemoglobin: 11.5 g/dL — ABNORMAL LOW (ref 13.0–17.0)
MCH: 27.8 pg (ref 26.0–34.0)
MCHC: 30.5 g/dL (ref 30.0–36.0)
MCV: 91.3 fL (ref 80.0–100.0)
Platelets: 138 10*3/uL — ABNORMAL LOW (ref 150–400)
RBC: 4.13 MIL/uL — ABNORMAL LOW (ref 4.22–5.81)
RDW: 11.3 % — ABNORMAL LOW (ref 11.5–15.5)
WBC: 9.5 10*3/uL (ref 4.0–10.5)
nRBC: 0 % (ref 0.0–0.2)

## 2018-05-13 MED ORDER — METOPROLOL TARTRATE 25 MG PO TABS: 12.5000 mg | ORAL_TABLET | Freq: Two times a day (BID) | ORAL | 0 refills | Status: DC

## 2018-05-13 MED ORDER — VITAMIN D (ERGOCALCIFEROL) 1.25 MG (50000 UNIT) PO CAPS: 50000.0000 [IU] | ORAL_CAPSULE | ORAL | Status: AC

## 2018-05-13 MED ORDER — POLYETHYLENE GLYCOL 3350 17 G PO PACK: 17.0000 g | PACK | Freq: Two times a day (BID) | ORAL | 0 refills | Status: DC

## 2018-05-13 MED ORDER — ASPIRIN 325 MG PO TABS: 325.0000 mg | ORAL_TABLET | Freq: Every day | ORAL | 0 refills | Status: DC

## 2018-05-13 MED ORDER — CALCIUM CARBONATE 1250 (500 CA) MG PO TABS: 1.0000 | ORAL_TABLET | Freq: Every day | ORAL | Status: AC

## 2018-05-13 NOTE — Care Management Important Message (Signed)
Important Message  Patient Details  Name: Anthony Galvan MRN: [id number] Date of Birth: 1921/10/12   Medicare Important Message Given:  Yes    [name] 05/13/2018, 4:31 PM

## 2018-05-13 NOTE — Clinical Social Work Placement (Signed)
   CLINICAL SOCIAL WORK PLACEMENT  NOTE  Date:  05/13/2018  Patient Details  Name: Anthony Galvan MRN: [id number] Date of Birth: 07/21/1921  Clinical Social Work is seeking post-discharge placement for this patient at the [place] level of care (*CSW will initial, date and re-position this form in  chart as items are completed):      Patient/family provided with [place] Work Department's list of facilities offering this level of care within the geographic area requested by the patient (or if unable, by the patient's family).  Yes   Patient/family informed of their freedom to choose among providers that offer the needed level of care, that participate in Medicare, Medicaid or managed care program needed by the patient, have an available bed and are willing to accept the patient.      Patient/family informed of [facility]'s ownership interest in [facility] and [facility], as well as of the fact that they are under no obligation to receive care at these facilities.  PASRR submitted to EDS on       PASRR number received on 05/08/18     Existing PASRR number confirmed on       FL2 transmitted to all facilities in geographic area requested by pt/family on 05/08/18     FL2 transmitted to all facilities within larger geographic area on       Patient informed that his/her managed care company has contracts with or will negotiate with certain facilities, including the following:        Yes   Patient/family informed of bed offers received.  Patient chooses bed at [facility]     Physician recommends and patient chooses bed at      Patient to be transferred to [facility] on 05/13/18.  Patient to be transferred to facility by PTAR     Patient family notified on 05/13/18 of transfer.  Name of family member notified:  Santiago Glad (daughter)     PHYSICIAN       Additional Comment:     _______________________________________________ Alberteen Sam, LCSW 05/13/2018, 2:27 PM

## 2018-05-13 NOTE — Progress Notes (Signed)
Physical Therapy Treatment Patient Details Name: Anthony Galvan MRN: [id number] DOB: 1922-02-20 Today's Date: 05/13/2018    History of Present Illness Pt is a 85 y.o. male who sustained L hip fx when he [place]. He underwent IM nail 05/07/18. PMH consists of OA, CAD, previous MI and R hip fx (11/2016).     PT Comments    Patient received in recliner, agrees to PT. Patient very friendly, reports no pain. Visitor present. Patient requires min guard for transfer sit to stand from recliner, able to increase gait distance to 10 feet this visit. Limited by weakness. As distance increased left LE became weaker. Patient performed LE exercises in the bed with min assist. Patient will benefit from continued skilled PT to address his weakness, difficulty with walking and transfers.        Follow Up Recommendations  SNF     Equipment Recommendations  None recommended by PT    Recommendations for Other Services       Precautions / Restrictions Precautions Precautions: Fall Restrictions Weight Bearing Restrictions: No LLE Weight Bearing: Weight bearing as tolerated    Mobility  Bed Mobility Overal bed mobility: Needs Assistance Bed Mobility: Sit to Supine       Sit to supine: Min assist   General bed mobility comments: min assist to bring LEs up onto bed  Transfers Overall transfer level: Needs assistance Equipment used: Rolling walker (2 wheeled) Transfers: Sit to/from Stand Sit to Stand: Min guard         General transfer comment: increased time and effort needed to rise from low recliner chair.   Ambulation/Gait Ambulation/Gait assistance: Min guard Gait Distance (Feet): 10 Feet Assistive device: Rolling walker (2 wheeled) Gait Pattern/deviations: Step-to pattern;Decreased step length - right;Decreased step length - left;Shuffle;Antalgic Gait velocity: decreased       Stairs             Wheelchair Mobility    Modified Rankin  (Stroke Patients Only)       Balance Overall balance assessment: Modified Independent Sitting-balance support: Single extremity supported Sitting balance-Leahy Scale: Good     Standing balance support: Bilateral upper extremity supported Standing balance-Leahy Scale: Good Standing balance comment: reliant on RW and external support                            Cognition Arousal/Alertness: Awake/alert Behavior During Therapy: WFL for tasks assessed/performed Overall Cognitive Status: Within Functional Limits for tasks assessed                                        Exercises Other Exercises Other Exercises: B AP, left hip abd/add, left heel slides x 10 reps with assist needed due to weakness     General Comments        Pertinent Vitals/Pain Pain Assessment: No/denies pain    Home Living Family/patient expects to be discharged to:: Skilled nursing facility Living Arrangements: Other relatives Available Help at Discharge: Family;Available PRN/intermittently Type of Home: House     Home Layout: One level Home Equipment: [place] - 2 wheels;Shower seat;Hand held shower head;Electric scooter;Grab bars - tub/shower      Prior Function Level of Independence: Independent with assistive device(s)      Comments: SPC usually and scooter for long distances   PT Goals (current goals  can now be found in the care plan section) Acute Rehab PT Goals Patient Stated Goal: get better PT Goal Formulation: With patient Time For Goal Achievement: 05/22/18 Potential to Achieve Goals: Good    Frequency    Min 3X/week      PT Plan      Co-evaluation              AM-PAC PT "6 Clicks" Mobility   Outcome Measure  Help needed turning from your back to your side while in a flat bed without using bedrails?: A Lot Help needed moving from lying on your back to sitting on the side of a flat bed without using bedrails?: A Lot Help needed moving to and  from a bed to a chair (including a wheelchair)?: A Little Help needed standing up from a chair using your arms (e.g., wheelchair or bedside chair)?: A Little Help needed to walk in hospital room?: A Little Help needed climbing 3-5 steps with a railing? : A Lot 6 Click Score: 15    End of Session Equipment Utilized During Treatment: Gait belt Activity Tolerance: Patient tolerated treatment well;Patient limited by fatigue Patient left: in bed;with bed alarm set Nurse Communication: Mobility status PT Visit Diagnosis: Other abnormalities of gait and mobility (R26.89);Muscle weakness (generalized) (M62.81);Repeated falls (R29.6)     Time: 1420-1450 PT Time Calculation (min) (ACUTE ONLY): 30 min  Charges:  $Gait Training: 8-22 mins $Therapeutic Exercise: 8-22 mins                     [employer], PT, GCS 05/13/18,3:03 PM

## 2018-05-13 NOTE — Progress Notes (Signed)
CSW received notification from [facility] that patient has been authorized by [employer]. CSW paged doctor for dc summary and orders.   [place], [place]

## 2018-05-13 NOTE — Progress Notes (Signed)
1830 Pt is A&O x4. Left hip incision with Mipilex dressing dry and intact. Report was given to [place] at [facility]. Pt is discharged via [facility] to [facility].

## 2018-05-13 NOTE — Progress Notes (Signed)
Patient will DC RV:UYEB Nursing Anticipated DC date:05/13/2018 Family notified: Berna Spare Transport XI:DHWY  Per MD patient ready for DC to [facility]. RN, patient, patient's family, and facility notified of DC. Discharge Summary sent to facility. RN given number for report [phone]. DC packet on chart. Ambulance transport requested for patient.  CSW signing off.  [place], [place]

## 2018-05-13 NOTE — Clinical Social Work Note (Signed)
Clinical Social Work Assessment  Patient Details  Name: Anthony Galvan MRN: [id number] Date of Birth: November 14, 1921  Date of referral:  05/13/18               Reason for consult:  Discharge Planning                Permission sought to share information with:  Case Manager, Facility Sport and exercise psychologist Permission granted to share information::  Yes, Verbal Permission Granted  Name::     Dev  Agency::  SNFs  Relationship::  grandson  Contact Information:  [phone]  Housing/Transportation Living arrangements for the past 2 months:  [place] of Information:  Patient Patient Interpreter Needed:  None Criminal Activity/Legal Involvement Pertinent to Current Situation/Hospitalization:  No - Comment as needed Significant Relationships:  Adult Children Lives with:  Self Do you feel safe going back to the place where you live?  No Need for family participation in patient care:     Care giving concerns:  CSW received referral for possible SNF placement at time of discharge. Spoke with patient regarding possibility of SNF placement . Patient's family   is currently unable to care for him at their home given patient's current needs and fall risk.  Patient and family at bedside expressed understanding of PT recommendation and are agreeable to SNF placement at time of discharge. CSW to continue to follow and assist with discharge planning needs.     Social Worker assessment / plan:  Spoke with patient and  Grandson at bedside   concerning possibility of rehab at [facility] before returning home.    Employment status:  Retired Nurse, adult PT Recommendations:  [place] / Referral to community resources:  [place]  Patient/Family's Response to care: Patient and family     recognize need for rehab before returning home and are agreeable to a SNF in [place]. They report preference for  [facility]      . CSW explained insurance authorization process. Patient's family reported that they want patient to get stronger to be able to come back home.    Patient/Family's Understanding of and Emotional Response to Diagnosis, Current Treatment, and Prognosis:  Patient/family is realistic regarding therapy needs and expressed being hopeful for SNF placement. Patient expressed understanding of CSW role and discharge process as well as medical condition. No questions/concerns about plan or treatment.    Emotional Assessment Appearance:  Appears stated age Attitude/Demeanor/Rapport:  Gracious Affect (typically observed):  Accepting Orientation:  Oriented to Self, Oriented to Place, Oriented to  Time, Oriented to Situation Alcohol / Substance use:  Not Applicable Psych involvement (Current and /or in the community):  No (Comment)  Discharge Needs  Concerns to be addressed:  Discharge Planning Concerns Readmission within the last 30 days:  No Current discharge risk:  Dependent with Mobility Barriers to Discharge:  Continued Medical Work up   [employer], LCSW 05/13/2018, 2:43 PM

## 2018-05-13 NOTE — Plan of Care (Signed)
  Problem: Pain Managment: Goal: General experience of comfort will improve Outcome: Progressing   Problem: Safety: Goal: Ability to remain free from injury will improve Outcome: Progressing   Problem: Skin Integrity: Goal: Risk for impaired skin integrity will decrease Outcome: Progressing   

## 2018-05-14 ENCOUNTER — Non-Acute Institutional Stay (SKILLED_NURSING_FACILITY): Payer: Medicare Other | Admitting: Internal Medicine

## 2018-05-14 ENCOUNTER — Encounter: Payer: Self-pay | Admitting: Internal Medicine

## 2018-05-14 DIAGNOSIS — I10 Essential (primary) hypertension: Secondary | ICD-10-CM

## 2018-05-14 DIAGNOSIS — Z9861 Coronary angioplasty status: Secondary | ICD-10-CM

## 2018-05-14 DIAGNOSIS — N183 Chronic kidney disease, stage 3 unspecified: Secondary | ICD-10-CM

## 2018-05-14 DIAGNOSIS — S72142A Displaced intertrochanteric fracture of left femur, initial encounter for closed fracture: Secondary | ICD-10-CM | POA: Diagnosis not present

## 2018-05-14 DIAGNOSIS — E785 Hyperlipidemia, unspecified: Secondary | ICD-10-CM | POA: Diagnosis not present

## 2018-05-14 DIAGNOSIS — I2510 Atherosclerotic heart disease of native coronary artery without angina pectoris: Secondary | ICD-10-CM

## 2018-05-14 NOTE — Progress Notes (Signed)
Location:    [place] Room Number: 132/P Place of Service:  SNF (31) Provider:  Granville Lewis PA-C  [name], MD  Patient Care Team: [name], MD as PCP - General (Internal Medicine) Leonie Man, MD as PCP - Cardiology (Cardiology) Danie Binder, MD (Gastroenterology)  Extended Emergency Contact Information Primary Emergency Contact: Tomasa Hose, [place] [address] [address] Phone: [phone] Mobile Phone: [phone] Relation: Daughter Secondary Emergency Contact: Purcell,Bruk Address: [address]          [place], [place] [address] [place] Phone: [phone] Relation: Grandson  Code Status:  Full Code Goals of care: Advanced Directive information Advanced Directives 05/14/2018  Does Patient Have a Medical Advance Directive? Yes  Type of Advance Directive (No Data)  Does patient want to make changes to medical advance directive? No - Patient declined  Would patient like information on creating a medical advance directive? No - Patient declined  Pre-existing out of facility DNR order (yellow form or pink MOST form) -     Chief Complaint  Patient presents with  . Hospitalization Follow-up    Hospitalization F/U Visit   Status post left hip fracture repair after fall  HPI:  Pt is a 85 y.o. male seen today for a hospital f/u after a left hip repair after a fall.  Patient apparently had a mechanical fall and sustained a left hip fracture-she was otherwise uninjured and stable he received an internal fixation on the right and tolerated surgery well.  At first he required a small amount of oxygen for 2 days following surgery but was stable thereafter on room air.  Pain was well controlled and he is here for therapy.  Hemoglobin remained stable.  He is on aspirin 325 mg a day for DVT prophylaxis x6 weeks.  He also was found to have significant vitamin D deficiency and  has been started on vitamin D apparently his level was 10.  Regards to hypertension he continues on Imdur 30 mg twice daily and Lopressor 12.5 mg twice daily his hydrochlorothiazide and valsartan were discontinued in the hospital because of low blood pressures.  Blood pressure today was 138/62 manually.  He also has a history of hyperlipidemia and is on atorvastatin.  He also has a history of coronary artery disease however appears Plavix was discontinued by cardiology he is on a statin and aspirin.  In regards to chronic kidney disease this appears relatively stable with creatinine of 1.34 on lab done in the hospital yesterday this will be updated later this week  Past Medical History:  Diagnosis Date  . CAD S/P percutaneous coronary angioplasty 08/01/2012   PCI of mid LAD with a VeriFlex Bare Metal Stent 3.0 mm x 12 mm - post-dilated to 3.5 mm.;  Catheterization in July 2018 showed with severe heavily calcified distal circumflex-left PDA stenosis.  Not favorable for PCI.  . Duodenal ulcer May 2011   on EGD  . GERD (gastroesophageal reflux disease)   . Helicobacter pylori gastritis MAY 2012 HP STOOL AG NEG  . HTN (hypertension)   . Hyperlipidemia   . MI (myocardial infarction) ([place]) 1976, 1980, [place] at [facility] (Dr. Iona Beard)  . Osteoarthritis   . S/P colonoscopy 2011   normal, internal hemorrhoids   Past Surgical History:  Procedure Laterality Date  . CARDIAC CATHETERIZATION  01/30/2007   D1  75 %, mid LAD 50%, 50-70% circumflex, 50-70% RCA.(Dr. Adora Fridge)  . CARDIAC CATHETERIZATION  12/11/2001   normal L main, small RCA, dominant LL Cfx with mild diffuse disease, LAD with mid 10-20% stenosis, ramus intermedius with mild diffuse disease (Dr. Jackie Plum)  . CATARACT EXTRACTION    . COLONOSCOPY  OCT 2011 ARS   SML [place]   in setting of MI  . ESOPHAGOGASTRODUODENOSCOPY N/A 10/09/2016   Procedure:  ESOPHAGOGASTRODUODENOSCOPY (EGD);  Surgeon: Danie Binder, MD;  [facility];  Service: Endoscopy;  Laterality: N/A;  . FEMUR IM NAIL Right 12/01/2016   Procedure: INTRAMEDULLARY (IM) NAIL RIGHT HIP;  Surgeon: Altamese Decherd, MD;  Location: [place];  Service: Orthopedics;  Laterality: Right;  . FEMUR IM NAIL Left 05/07/2018   Procedure: INTRAMEDULLARY (IM) NAIL FEMORAL;  Surgeon: Nicholes Stairs, MD;  Location: [place];  Service: Orthopedics;  Laterality: Left;  . LEFT HEART CATH AND CORONARY ANGIOGRAPHY N/A 10/23/2016   Procedure: Left Heart Cath and Coronary Angiography;  Surgeon: Sherren Mocha, MD;  Location: [facility] CV LAB;  Service: Cardiovascular: Two-vessel CAD (left dominant).  Patent BMS in p LAD.  Severe, heavily calcified dCx-LPDA lesion.  Not favorable for PCI.  Marland Kitchen LEFT HEART CATHETERIZATION WITH CORONARY ANGIOGRAM N/A 08/01/2012   Procedure: LEFT HEART CATHETERIZATION WITH CORONARY ANGIOGRAM;  Surgeon: Leonie Man, MD;  Location: [facility] CATH LAB: Mid LAD 99% apple core; D2 ostial 70-80% (2 small for PCI) small nondominant RCA 60-70%. Distal circumflex/L PDA ~50% --> PCI of LAD  . NM MYOCAR PERF WALL MOTION  08/2003   adenosine stress - focal decreased perfusion defect in distal inferior wall, no significant ischemic changes  . PERCUTANEOUS STENT INTERVENTION  08/01/2012   Procedure: PERCUTANEOUS STENT INTERVENTION;  Surgeon: Leonie Man, MD;  Location: [facility] CATH LAB; ;PCI of mid LAD with a VeriFlex Bare Metal Stent 3.0 mm x 12 mm - post-dilated to 3.5 mm.  . TRANSTHORACIC ECHOCARDIOGRAM  10/2016   mild LVH - 60-65%. Gr 1 DD. No RWMA. Mild MR. Mod TR. Mild RA dilation.  PAP ~ 60 mmHg.  Marland Kitchen UPPER GASTROINTESTINAL ENDOSCOPY  08/2009   MELENA, HEMATEMESIS --> DUODENAL ULCER, Bx: H PYLORI POS    Allergies  Allergen Reactions  . Lisinopril Other (See Comments)    "Allergic," per [facility]- patient is unaware of any reaction (??)    Outpatient Encounter Medications as of  05/14/2018  Medication Sig  . acetaminophen (TYLENOL) 325 MG tablet Take 2 tablets (650 mg total) by mouth 3 (three) times daily as needed (pain).  Marland Kitchen aspirin 325 MG tablet Take 1 tablet (325 mg total) by mouth daily.  Marland Kitchen atorvastatin (LIPITOR) 80 MG tablet Take 1 tablet (80 mg total) by mouth daily at 6 PM.  . calcium carbonate (OS-CAL - DOSED IN MG OF ELEMENTAL CALCIUM) 1250 (500 Ca) MG tablet Take 1 tablet (500 mg of elemental calcium total) by mouth daily with breakfast.  . isosorbide mononitrate (IMDUR) 30 MG 24 hr tablet Take 30 mg by mouth daily.  . metoprolol tartrate (LOPRESSOR) 25 MG tablet Take 0.5 tablets (12.5 mg total) by mouth 2 (two) times daily.  . Multiple Vitamin (MULTIVITAMIN WITH MINERALS) TABS tablet Take 1 tablet by mouth daily.  Marland Kitchen NITROSTAT 0.4 MG SL tablet PLACE 1 TABLET UNDER TONGUE EVERY 5 MINUTES FOR 3 DOSES AS NEEDED.  Marland Kitchen polyethylene glycol (MIRALAX / GLYCOLAX) packet Take 17 g by mouth 2 (  two) times daily.  Derrill Memo ON 05/16/2018] Vitamin D, Ergocalciferol, (DRISDOL) 1.25 MG (50000 UT) CAPS capsule Take 1 capsule (50,000 Units total) by mouth every 7 (seven) days.   No facility-administered encounter medications on file as of 05/14/2018.      Review of Systems   In general he is not complaining of any fever chills.  Skin does not complain of rashes or itching.  Head ears eyes nose mouth and throat is not complain of visual changes or sore throat.  Respiratory does not complain of shortness of breath or cough no longer requires oxygen.  Cardiac is not complaining of chest pain has very minimal left pedal edema.  GI is not complaining of abdominal pain nausea vomiting diarrhea constipation.  GU does not complain of dysuria.  Musculoskeletal says his hip pain joint pain is well controlled on Tylenol.  Neurologic does not complain of dizziness headache numbness.  And psych does not complain of being depressed or anxious continues to be quite gregarious and  outgoing.       There is no immunization history on file for this patient. Pertinent  Health Maintenance Due  Topic Date Due  . INFLUENZA VACCINE  06/14/2018 (Originally 11/15/2017)  . PNA vac Low Risk Adult (1 of 2 - PCV13) 06/14/2018 (Originally 02/26/1989)   Fall Risk  09/03/2012  Falls in the past year? No   Functional Status Survey:    Vitals:   05/14/18 1508  BP: 138/62  Pulse: 72  --- He is afebrile respirations are 17 Physical Exam In general this is a pleasant elderly male in no distress he is bright and alert.  His skin is warm and dry there is a dressing over the Left hip surgical site there is some mild bruising around the area.  Eyes visual acuity appears to be intact sclera and conjunctive are clear.  Oropharynx is clear mucous membranes moist he has numerous extractions.  Chest is clear to auscultation there is no labored breathing.  Heart is regular rate and rhythm he has minimal left pedal edema pedal pulses are intact bilaterally.  Abdomen is obese soft nontender with positive bowel sounds.  Musculoskeletal Limited exam since she is in bed but is able to move all extremities x4 Limited mobility of his left lower extremity secondary to recent surgery.  Neurologic is grossly intact his speech is clear no lateralizing findings.  Psych he is alert and oriented very pleasant and appropriate   labs reviewed: Recent Labs    05/08/18 0140 05/10/18 1033 05/13/18 0420  NA 135 137 135  K 4.4 4.1 4.7  CL 100 102 98  CO2 24 27 26   GLUCOSE 105* 119* 98  BUN 18 28* 29*  CREATININE 1.30* 1.64* 1.34*  CALCIUM 8.7* 8.6* 8.6*   No results for input(s): AST, ALT, ALKPHOS, BILITOT, PROT, ALBUMIN in the last 8760 hours. Recent Labs    05/07/18 1153  05/11/18 0523 05/12/18 0640 05/13/18 0420  WBC 10.6*   < > 10.9* 9.9 9.5  NEUTROABS 7.7  --   --   --   --   HGB 14.2   < > 11.3* 11.6* 11.5*  HCT 47.2   < > 35.4* 37.7* 37.7*  MCV 92.7   < > 91.0 91.3  91.3  PLT 133*   < > 112* 133* 138*   < > = values in this interval not displayed.   Lab Results  Component Value Date   TSH 1.447 08/01/2012   Lab  Results  Component Value Date   HGBA1C 5.4 08/01/2012   Lab Results  Component Value Date   CHOL 119 10/21/2016   HDL 57 10/21/2016   LDLCALC 45 10/21/2016   TRIG 87 10/21/2016   CHOLHDL 2.1 10/21/2016    Significant Diagnostic Results in last 30 days:  No results found.  Assessment/Plan  #1 history of left hip fracture with repair he appears to have tolerated surgery quite well he did not require transfusion his hemoglobin is stable around 11 will have this updated later this week-she continues on aspirin 325 mg a day for DVT prophylaxis Tylenol appears to be effective for pain he will need PT and OT.  2.  Vitamin D deficiency he has been started on vitamin D also recommendation to possibly begin a biphosphonate in about 8 weeks as well as a DEXA scan.  3.  Hypertension this appears stable today apparently his hydrochlorothiazide and valsartan were discontinued in the hospital because of low blood pressures.  He is on Imdur as well as Lopressor.  4.  History hyperlipidemia he continues on atorvastatin.  5.  History of coronary artery disease he is on aspirin and a statin Plavix apparently was discontinued previously by cardiology he does have a history of a statin of the left anterior descending artery He does have an order for nitroglycerin as needed.  6.  History of chronic kidney disease this appears stable with a creatinine of 1.34 in hospital will update this as well.  7.  History of constipation this appears stable he is on MiraLAX twice a day he says he is having regular bowel movements  CPT- 99310-of note greater than 35 minutes spent assessing patient-reviewing his chart and labs- coordinating and formulating a plan of care for numerous diagnoses- of note greater than 50% of time spent coordinating a plan of care with  input as noted above

## 2018-05-15 ENCOUNTER — Encounter (HOSPITAL_COMMUNITY)
Admission: RE | Admit: 2018-05-15 | Discharge: 2018-05-15 | Disposition: A | Discharge status: Home / Self Care | Payer: Medicare Other | Source: Skilled Nursing Facility | Attending: Internal Medicine | Admitting: Internal Medicine

## 2018-05-15 DIAGNOSIS — I2510 Atherosclerotic heart disease of native coronary artery without angina pectoris: Secondary | ICD-10-CM | POA: Insufficient documentation

## 2018-05-15 DIAGNOSIS — E559 Vitamin D deficiency, unspecified: Secondary | ICD-10-CM | POA: Insufficient documentation

## 2018-05-15 DIAGNOSIS — I252 Old myocardial infarction: Secondary | ICD-10-CM | POA: Insufficient documentation

## 2018-05-15 DIAGNOSIS — I10 Essential (primary) hypertension: Secondary | ICD-10-CM | POA: Insufficient documentation

## 2018-05-15 DIAGNOSIS — E785 Hyperlipidemia, unspecified: Secondary | ICD-10-CM | POA: Insufficient documentation

## 2018-05-15 LAB — CBC WITH DIFFERENTIAL/PLATELET
ABS IMMATURE GRANULOCYTES: 0.06 10*3/uL (ref 0.00–0.07)
Basophils Absolute: 0.1 10*3/uL (ref 0.0–0.1)
Basophils Relative: 1 %
Eosinophils Absolute: 0.4 10*3/uL (ref 0.0–0.5)
Eosinophils Relative: 4 %
HCT: 42.2 % (ref 39.0–52.0)
Hemoglobin: 12.6 g/dL — ABNORMAL LOW (ref 13.0–17.0)
Immature Granulocytes: 1 %
LYMPHS ABS: 2.5 10*3/uL (ref 0.7–4.0)
Lymphocytes Relative: 23 %
MCH: 28.2 pg (ref 26.0–34.0)
MCHC: 29.9 g/dL — ABNORMAL LOW (ref 30.0–36.0)
MCV: 94.4 fL (ref 80.0–100.0)
Monocytes Absolute: 1 10*3/uL (ref 0.1–1.0)
Monocytes Relative: 9 %
NEUTROS ABS: 6.8 10*3/uL (ref 1.7–7.7)
Neutrophils Relative %: 62 %
Platelets: 219 10*3/uL (ref 150–400)
RBC: 4.47 MIL/uL (ref 4.22–5.81)
RDW: 11.5 % (ref 11.5–15.5)
WBC: 10.9 10*3/uL — ABNORMAL HIGH (ref 4.0–10.5)
nRBC: 0 % (ref 0.0–0.2)

## 2018-05-15 LAB — BASIC METABOLIC PANEL
Anion gap: 9 (ref 5–15)
BUN: 24 mg/dL — ABNORMAL HIGH (ref 8–23)
CHLORIDE: 102 mmol/L (ref 98–111)
CO2: 27 mmol/L (ref 22–32)
Calcium: 9.1 mg/dL (ref 8.9–10.3)
Creatinine, Ser: 1.16 mg/dL (ref 0.61–1.24)
GFR calc Af Amer: >60 mL/min (ref >60)
GFR calc non Af Amer: 54 mL/min — ABNORMAL LOW (ref >60)
Glucose, Bld: 101 mg/dL — ABNORMAL HIGH (ref 70–99)
Potassium: 4.4 mmol/L (ref 3.5–5.1)
Sodium: 138 mmol/L (ref 135–145)

## 2018-05-16 ENCOUNTER — Non-Acute Institutional Stay (SKILLED_NURSING_FACILITY): Payer: Medicare Other | Admitting: Internal Medicine

## 2018-05-16 ENCOUNTER — Encounter: Payer: Self-pay | Admitting: Internal Medicine

## 2018-05-16 DIAGNOSIS — I10 Essential (primary) hypertension: Secondary | ICD-10-CM | POA: Diagnosis not present

## 2018-05-16 DIAGNOSIS — K219 Gastro-esophageal reflux disease without esophagitis: Secondary | ICD-10-CM

## 2018-05-16 DIAGNOSIS — N183 Chronic kidney disease, stage 3 unspecified: Secondary | ICD-10-CM

## 2018-05-16 DIAGNOSIS — Z9861 Coronary angioplasty status: Secondary | ICD-10-CM

## 2018-05-16 DIAGNOSIS — I2510 Atherosclerotic heart disease of native coronary artery without angina pectoris: Secondary | ICD-10-CM | POA: Diagnosis not present

## 2018-05-16 DIAGNOSIS — S72142S Displaced intertrochanteric fracture of left femur, sequela: Secondary | ICD-10-CM | POA: Diagnosis not present

## 2018-05-16 MED ORDER — HYDROCODONE-ACETAMINOPHEN 5-325 MG PO TABS: 1.0000 | ORAL_TABLET | Freq: Four times a day (QID) | ORAL | 0 refills | Status: DC | PRN

## 2018-05-16 NOTE — Progress Notes (Signed)
Provider: Veleta Miners MD  Location:    [facility] Room Number: 132/P Place of Service:  SNF (31)  PCP: [name], MD Patient Care Team: [name], MD as PCP - General (Internal Medicine) Leonie Man, MD as PCP - Cardiology (Cardiology) Danie Binder, MD (Gastroenterology)  Extended Emergency Contact Information Primary Emergency Contact: Tomasa Hose, [place] [address] [place]itter of [place] Phone: [phone] Mobile Phone: [phone] Relation: Daughter Secondary Emergency Contact: Purcell,Dakwon Address: [address]          [place], [place] [address] [place] Phone: [phone] Relation: Grandson  Code Status: Full Code Goals of Care: Advanced Directive information Advanced Directives 05/16/2018  Does Patient Have a Medical Advance Directive? Yes  Type of Advance Directive (No Data)  Does patient want to make changes to medical advance directive? No - Patient declined  Would patient like information on creating a medical advance directive? No - Patient declined  Pre-existing out of facility DNR order (yellow form or pink MOST form) -      Chief Complaint  Patient presents with  . New Admit To SNF    New Admission Visit    HPI: Patient is a 85 y.o. male seen today for admission to SNF for therapy He stayed in the hospital from 01/21-01/27 For Left Hip Fracture.  Patient has h/o CAD S/P PTCA and stent placement in LAD in 2014,s/p Cardiac cath showing 2 vessel disesa in 07/18 on medical therapy,  Hypertension, H/O GI Bleed due to Duodenal ulcer in 06/18,  Hyperlipidemia, and Right Hip Fracture in 08/18  Patient had a mechanical fall while he was outside with his friend.  He was found to have a left hip fracture.  Intramedullary implant on 01/21. Patient is now in SNF for therapy.  His postop course was uncomplicated. Hypertension medicines were stopped in the hospital due to low blood  pressure. Patient doing well with therapy.  But the therapist set that he he has been complaining of pain. Patient lives with his grandson.  He does not drive.  He walks with a cane.  He is independent in his ADLs and IADLs. Patient denied any chest pain cough or shortness of breath   Past Medical History:  Diagnosis Date  . CAD S/P percutaneous coronary angioplasty 08/01/2012   PCI of mid LAD with a VeriFlex Bare Metal Stent 3.0 mm x 12 mm - post-dilated to 3.5 mm.;  Catheterization in July 2018 showed with severe heavily calcified distal circumflex-left PDA stenosis.  Not favorable for PCI.  . Duodenal ulcer May 2011   on EGD  . GERD (gastroesophageal reflux disease)   . Helicobacter pylori gastritis MAY 2012 HP STOOL AG NEG  . HTN (hypertension)   . Hyperlipidemia   . MI (myocardial infarction) ([place]) 1976, 1980, [place] for at [facility] and [place] (Dr. Iona Beard)  . Osteoarthritis   . S/P colonoscopy 2011   normal, internal hemorrhoids   Past Surgical History:  Procedure Laterality Date  . CARDIAC CATHETERIZATION  01/30/2007   D1 75 %, mid LAD 50%, 50-70% circumflex, 50-70% RCA.(Dr. Adora Fridge)  . CARDIAC CATHETERIZATION  12/11/2001   normal L main, small RCA, dominant LL Cfx with mild diffuse disease, LAD with mid 10-20% stenosis, ramus intermedius with mild diffuse disease (Dr. Jackie Plum)  . CATARACT EXTRACTION    . COLONOSCOPY  OCT 2011 ARS  [place]   in setting of MI  . ESOPHAGOGASTRODUODENOSCOPY N/A 10/09/2016   Procedure: ESOPHAGOGASTRODUODENOSCOPY (EGD);  Surgeon: Danie Binder, MD;  [facility];  Service: Endoscopy;  Laterality: N/A;  . FEMUR IM NAIL Right 12/01/2016   Procedure: INTRAMEDULLARY (IM) NAIL RIGHT HIP;  Surgeon: Altamese Stewart, MD;  Location: [facility];  Service: Orthopedics;  Laterality: Right;  . FEMUR IM NAIL Left 05/07/2018   Procedure: INTRAMEDULLARY (IM) NAIL FEMORAL;  Surgeon: Nicholes Stairs, MD;  Location: [facility];  Service: Orthopedics;  Laterality: Left;  . LEFT HEART CATH AND CORONARY ANGIOGRAPHY N/A 10/23/2016   Procedure: Left Heart Cath and Coronary Angiography;  Surgeon: Sherren Mocha, MD;  Location: [facility] CV LAB;  Service: Cardiovascular: Two-vessel CAD (left dominant).  Patent BMS in p LAD.  Severe, heavily calcified dCx-LPDA lesion.  Not favorable for PCI.  Marland Kitchen LEFT HEART CATHETERIZATION WITH CORONARY ANGIOGRAM N/A 08/01/2012   Procedure: LEFT HEART CATHETERIZATION WITH CORONARY ANGIOGRAM;  Surgeon: Leonie Man, MD;  Location: [facility] CATH LAB: Mid LAD 99% apple core; D2 ostial 70-80% (2 small for PCI) small nondominant RCA 60-70%. Distal circumflex/L PDA ~50% --> PCI of LAD  . NM MYOCAR PERF WALL MOTION  08/2003   adenosine stress - focal decreased perfusion defect in distal inferior wall, no significant ischemic changes  . PERCUTANEOUS STENT INTERVENTION  08/01/2012   Procedure: PERCUTANEOUS STENT INTERVENTION;  Surgeon: Leonie Man, MD;  Location: [facility] CATH LAB; ;PCI of mid LAD with a VeriFlex Bare Metal Stent 3.0 mm x 12 mm - post-dilated to 3.5 mm.  . TRANSTHORACIC ECHOCARDIOGRAM  10/2016   mild LVH - 60-65%. Gr 1 DD. No RWMA. Mild MR. Mod TR. Mild RA dilation.  PAP ~ 60 mmHg.  Marland Kitchen UPPER GASTROINTESTINAL ENDOSCOPY  08/2009   MELENA, HEMATEMESIS --> DUODENAL ULCER, Bx: H PYLORI POS    reports that he has never smoked. He has never used smokeless tobacco. He reports that he does not drink alcohol or use drugs. Social History   Socioeconomic History  . Marital status: Widowed    Spouse name: Not on file  . Number of children: 3  . Years of education: Not on file  . Highest education level: Not on file  Occupational History  . Not on file  Social Needs  . Financial resource strain: Not on file  . Food insecurity:    Worry: Not on file    Inability: Not on file  . Transportation needs:    Medical: Not on file    Non-medical: Not on file  Tobacco Use  .  Smoking status: Never Smoker  . Smokeless tobacco: Never Used  Substance and Sexual Activity  . Alcohol use: No  . Drug use: No  . Sexual activity: Not on file  Lifestyle  . Physical activity:    Days per week: Not on file    Minutes per session: Not on file  . Stress: Not on file  Relationships  . Social connections:    Talks on phone: Not on file    Gets together: Not on file    Attends religious service: Not on file    Active member of club or organization: Not on file    Attends meetings of clubs or organizations: Not on file    Relationship status: Not on file  . Intimate partner violence:    Fear of current or ex partner: Not on file  Emotionally abused: Not on file    Physically abused: Not on file    Forced sexual activity: Not on file  Other Topics Concern  . Not on file  Social History Narrative   Is a father of 3 with 3 grandchildren 3 great-grandchildren. He lives with one of his 2 grandsons.   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 He was a war 2 veteran in the [place]. 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   He never smoked. He does not drink alcohol.   He is very active, and a majority. He walks 30+ minutes every day. He also likes to play golf.    Functional Status Survey:    Family History  Problem Relation Age of Onset  . Heart disease Mother   . Kidney disease Father   . Heart disease Brother 27  . Heart disease Sister   . Colon cancer Neg Hx     Health Maintenance  Topic Date Due  . INFLUENZA VACCINE  06/14/2018 (Originally 11/15/2017)  . TETANUS/TDAP  06/14/2018 (Originally 02/27/1941)  . PNA vac Low Risk Adult (1 of 2 - PCV13) 06/14/2018 (Originally 02/26/1989)    Allergies  Allergen Reactions  . Lisinopril Other (See Comments)    "Allergic," per the [facility]- patient is unaware of any reaction (??)    Outpatient  Encounter Medications as of 05/16/2018  Medication Sig  . acetaminophen (TYLENOL) 325 MG tablet Take 2 tablets (650 mg total) by mouth 3 (three) times daily as needed (pain).  Marland Kitchen aspirin 325 MG tablet Take 1 tablet (325 mg total) by mouth daily.  Marland Kitchen atorvastatin (LIPITOR) 80 MG tablet Take 1 tablet (80 mg total) by mouth daily at 6 PM.  . calcium carbonate (OS-CAL - DOSED IN MG OF ELEMENTAL CALCIUM) 1250 (500 Ca) MG tablet Take 1 tablet (500 mg of elemental calcium total) by mouth daily with breakfast.  . isosorbide mononitrate (IMDUR) 30 MG 24 hr tablet Take 30 mg by mouth daily.  . metoprolol tartrate (LOPRESSOR) 25 MG tablet Take 0.5 tablets (12.5 mg total) by mouth 2 (two) times daily.  . Multiple Vitamin (MULTIVITAMIN WITH MINERALS) TABS tablet Take 1 tablet by mouth daily.  Marland Kitchen NITROSTAT 0.4 MG SL tablet PLACE 1 TABLET UNDER TONGUE EVERY 5 MINUTES FOR 3 DOSES AS NEEDED.  Marland Kitchen polyethylene glycol (MIRALAX / GLYCOLAX) packet Take 17 g by mouth 2 (two) times daily.  . Vitamin D, Ergocalciferol, (DRISDOL) 1.25 MG (50000 UT) CAPS capsule Take 1 capsule (50,000 Units total) by mouth every 7 (seven) days.   No facility-administered encounter medications on file as of 05/16/2018.      Review of Systems  Review of Systems  Constitutional: Negative for activity change, appetite change, chills, diaphoresis, fatigue and fever.  HENT: Negative for mouth sores, postnasal drip, rhinorrhea, sinus pain and sore throat.   Respiratory: Negative for apnea, cough, chest tightness, shortness of breath and wheezing.   Cardiovascular: Negative for chest pain, palpitations and leg swelling.  Gastrointestinal: Negative for abdominal distention, abdominal pain, constipation, diarrhea, nausea and vomiting.  Genitourinary: Negative for dysuria and frequency.  Musculoskeletal: Negative for arthralgias, joint swelling and myalgias.  Skin: Negative for rash.  Neurological: Negative for dizziness, syncope, weakness,  light-headedness and numbness.  Psychiatric/Behavioral: Negative for behavioral problems, confusion and sleep disturbance.     Vitals:  05/16/18 0942  BP: (!) 135/59  Pulse: 83  Resp: 18  Temp: (!) 96.9 F (36.1 C)  TempSrc: Oral  SpO2: 96%   There is no height or weight on file to calculate BMI. Physical Exam Constitutional:      Appearance: He is well-developed.  HENT:     Head: Normocephalic.  Eyes:     Pupils: Pupils are equal, round, and reactive to light.  Neck:     Musculoskeletal: Neck supple.  Cardiovascular:     Rate and Rhythm: Normal rate and regular rhythm.     Heart sounds: Normal heart sounds. No murmur.  Pulmonary:     Effort: Pulmonary effort is normal. No respiratory distress.     Breath sounds: Normal breath sounds. No wheezing or rales.  Abdominal:     General: Bowel sounds are normal. There is no distension.     Palpations: Abdomen is soft.     Tenderness: There is no abdominal tenderness. There is no rebound.  Skin:    General: Skin is warm and dry.  Neurological:     Mental Status: He is alert and oriented to person, place, and time.     Comments: Patient has good strength in all extremities. Except the one with Surgery.  Psychiatric:        Thought Content: Thought content normal.     Labs reviewed: Basic Metabolic Panel: Recent Labs    05/10/18 1033 05/13/18 0420 05/15/18 0700  NA 137 135 138  K 4.1 4.7 4.4  CL 102 98 102  CO2 27 26 27   GLUCOSE 119* 98 101*  BUN 28* 29* 24*  CREATININE 1.64* 1.34* 1.16  CALCIUM 8.6* 8.6* 9.1   Liver Function Tests: No results for input(s): AST, ALT, ALKPHOS, BILITOT, PROT, ALBUMIN in the last 8760 hours. No results for input(s): LIPASE, AMYLASE in the last 8760 hours. No results for input(s): AMMONIA in the last 8760 hours. CBC: Recent Labs    05/07/18 1153  05/12/18 0640 05/13/18 0420 05/15/18 0700  WBC 10.6*   < > 9.9 9.5 10.9*  NEUTROABS 7.7  --   --   --  6.8  HGB 14.2   < > 11.6*  11.5* 12.6*  HCT 47.2   < > 37.7* 37.7* 42.2  MCV 92.7   < > 91.3 91.3 94.4  PLT 133*   < > 133* 138* 219   < > = values in this interval not displayed.   Cardiac Enzymes: No results for input(s): CKTOTAL, CKMB, CKMBINDEX, TROPONINI in the last 8760 hours. BNP: Invalid input(s): POCBNP Lab Results  Component Value Date   HGBA1C 5.4 08/01/2012   Lab Results  Component Value Date   TSH 1.447 08/01/2012   No results found for: VITAMINB12 No results found for: FOLATE No results found for: IRON, TIBC, FERRITIN  Imaging and Procedures obtained prior to SNF admission: No results found.  Assessment/Plan Essential hypertension Lots of Changes were made to his Meds in the hospital due to Low BP His Imdur is now 30 mg Hydralazine,and Valsartan was discontinued Metoprolol was reduced in the Dose Will continue to monitor BP  CAD S/P percutaneous coronary angioplasty  Plavix was discontinued On Higher dose of Aspirin. Needs to stay on it after Discharge,  On beta Blocker and Lower dose of Imdur Continue on Statin  CKD (chronic kidney disease) stage 3 Creat Stable  Hip Fracture WBAT Per therapy he would need something stronger for Pain Control Will start him on Norco  Aspirin for Prophylaxis Follow up with Ortho On Vit D     Family/ staff Communication:   Labs/tests ordered:  Total time spent in this patient care encounter was 45_ minutes; greater than 50% of the visit spent counseling patient, reviewing records , Labs and coordinating care for problems addressed at this encounter.

## 2018-05-23 ENCOUNTER — Non-Acute Institutional Stay (SKILLED_NURSING_FACILITY): Payer: Medicare Other | Admitting: Internal Medicine

## 2018-05-23 ENCOUNTER — Encounter: Payer: Self-pay | Admitting: Internal Medicine

## 2018-05-23 DIAGNOSIS — N183 Chronic kidney disease, stage 3 unspecified: Secondary | ICD-10-CM

## 2018-05-23 DIAGNOSIS — I2510 Atherosclerotic heart disease of native coronary artery without angina pectoris: Secondary | ICD-10-CM | POA: Diagnosis not present

## 2018-05-23 DIAGNOSIS — S72142S Displaced intertrochanteric fracture of left femur, sequela: Secondary | ICD-10-CM

## 2018-05-23 DIAGNOSIS — I10 Essential (primary) hypertension: Secondary | ICD-10-CM | POA: Diagnosis not present

## 2018-05-23 NOTE — Progress Notes (Signed)
Location:    [place] Room Number: 132/P Place of Service:  SNF (31)  Provider: Granville Lewis PA-C  PCP: [name], MD Patient Care Team: [name], MD as PCP - General (Internal Medicine) Leonie Man, MD as PCP - Cardiology (Cardiology) Danie Binder, MD (Gastroenterology)  Extended Emergency Contact Information Primary Emergency Contact: Tomasa Hose, [place] [address] [place] Phone: [phone] Mobile Phone: [phone] Relation: Daughter Secondary Emergency Contact: Purcell,Primus Address: [address]          [place], [place] [address] [place] Phone: [phone] Relation: Grandson  Code Status: Full Code Goals of care:  Advanced Directive information Advanced Directives 05/23/2018  Does Patient Have a Medical Advance Directive? Yes  Type of Advance Directive (No Data)  Does patient want to make changes to medical advance directive? No - Patient declined  Would patient like information on creating a medical advance directive? No - Patient declined  Pre-existing out of facility DNR order (yellow form or pink MOST form) -     Allergies  Allergen Reactions  . Lisinopril Other (See Comments)    "Allergic," per the [facility]- patient is unaware of any reaction (??)    Chief Complaint  Patient presents with  . Discharge Note    Discharge Visit    HPI:  85 y.o. male seen today for discharge from facility this weekend.  Patient was here for short-term rehab after hospitalization for left hip fracture after a fall that was surgically repaired.  He also has history of coronary artery disease status post PTCA and stent placement in 2014 as well as a cardiac cath that showed two-vessel disease in July 2018 he is on medical therapy he also has a history of hypertension as well as a history of a GI bleed because of a duodenal ulcer back in June 2018- he also has a previous history of  hyperlipidemia as well as a history of a right hip fracture back in August 2018.  Apparently fell at outside and found to have a left hip fracture he did have an intramedullary implant on January 21.  He was here for rehab and has done quite well with a uncomplicated postop course.  His blood pressure medications were stopped in the hospital because of low blood pressure readings- he is currently on Imdur he is also on low-dose Lopressor 12.5 mg twice daily blood pressures appear to be fairly stable at times he will have some systolic spikes I got a manual reading of 138/60 today-- I see some systolics listed in the 1 40-1 60 range appears there is some variability but since he is about to go home would be hesitant to be real aggressive because of concerns of hypotension this will need follow-up by primary care provider.  Patient does live with his grandson and will be returning there he will need continued PT and OT as a wheelchair to assist with ambulation-he is receiving Norco for pain as for this occasionally.  Apparently asked the nurse this morning right before I got in the room so he does take this occasionally.  In regards to the hip fracture he is on aspirin 325 mg a day for DVT prophylaxis-     Past Medical History:  Diagnosis Date  . CAD S/P percutaneous coronary angioplasty 08/01/2012   PCI of mid LAD with a VeriFlex Bare Metal Stent 3.0 mm x 12 mm -  post-dilated to 3.5 mm.;  Catheterization in July 2018 showed with severe heavily calcified distal circumflex-left PDA stenosis.  Not favorable for PCI.  . Duodenal ulcer May 2011   on EGD  . GERD (gastroesophageal reflux disease)   . Helicobacter pylori gastritis MAY 2012 HP STOOL AG NEG  . HTN (hypertension)   . Hyperlipidemia   . MI (myocardial infarction) ([place]) 1976, 1980, [place] for at [facility] and [place] (Dr. Iona Beard)  . Osteoarthritis   . S/P colonoscopy 2011   normal, internal hemorrhoids    Past  Surgical History:  Procedure Laterality Date  . CARDIAC CATHETERIZATION  01/30/2007   D1 75 %, mid LAD 50%, 50-70% circumflex, 50-70% RCA.(Dr. Adora Fridge)  . CARDIAC CATHETERIZATION  12/11/2001   normal L main, small RCA, dominant LL Cfx with mild diffuse disease, LAD with mid 10-20% stenosis, ramus intermedius with mild diffuse disease (Dr. Jackie Plum)  . CATARACT EXTRACTION    . COLONOSCOPY  OCT 2011 ARS   [facility] [place]   in setting of MI  . ESOPHAGOGASTRODUODENOSCOPY N/A 10/09/2016   Procedure: ESOPHAGOGASTRODUODENOSCOPY (EGD);  Surgeon: Danie Binder, MD;  [facility];  Service: Endoscopy;  Laterality: N/A;  . FEMUR IM NAIL Right 12/01/2016   Procedure: INTRAMEDULLARY (IM) NAIL RIGHT HIP;  Surgeon: Altamese Condon, MD;  Location: [facility];  Service: Orthopedics;  Laterality: Right;  . FEMUR IM NAIL Left 05/07/2018   Procedure: INTRAMEDULLARY (IM) NAIL FEMORAL;  Surgeon: Nicholes Stairs, MD;  Location: [facility];  Service: Orthopedics;  Laterality: Left;  . LEFT HEART CATH AND CORONARY ANGIOGRAPHY N/A 10/23/2016   Procedure: Left Heart Cath and Coronary Angiography;  Surgeon: Sherren Mocha, MD;  Location: [facility] [facility] CV LAB;  Service: Cardiovascular: Two-vessel CAD (left dominant).  Patent BMS in p LAD.  Severe, heavily calcified dCx-LPDA lesion.  Not favorable for PCI.  Marland Kitchen LEFT HEART CATHETERIZATION WITH CORONARY ANGIOGRAM N/A 08/01/2012   Procedure: LEFT HEART CATHETERIZATION WITH CORONARY ANGIOGRAM;  Surgeon: Leonie Man, MD;  Location: [facility] CATH LAB: Mid LAD 99% apple core; D2 ostial 70-80% (2 small for PCI) small nondominant RCA 60-70%. Distal circumflex/L PDA ~50% --> PCI of LAD  . NM MYOCAR PERF WALL MOTION  08/2003   adenosine stress - focal decreased perfusion defect in distal inferior wall, no significant ischemic changes  . PERCUTANEOUS STENT INTERVENTION  08/01/2012   Procedure: PERCUTANEOUS STENT INTERVENTION;  Surgeon: Leonie Man, MD;   Location: [facility] CATH LAB; ;PCI of mid LAD with a VeriFlex Bare Metal Stent 3.0 mm x 12 mm - post-dilated to 3.5 mm.  . TRANSTHORACIC ECHOCARDIOGRAM  10/2016   mild LVH - 60-65%. Gr 1 DD. No RWMA. Mild MR. Mod TR. Mild RA dilation.  PAP ~ 60 mmHg.  Marland Kitchen UPPER GASTROINTESTINAL ENDOSCOPY  08/2009   MELENA, HEMATEMESIS --> DUODENAL ULCER, Bx: H PYLORI POS      reports that he has never smoked. He has never used smokeless tobacco. He reports that he does not drink alcohol or use drugs. Social History   Socioeconomic History  . Marital status: Widowed    Spouse name: Not on file  . Number of children: 3  . Years of education: Not on file  . Highest education level: Not on file  Occupational History  . Not on file  Social Needs  . Financial resource strain: Not on file  . Food insecurity:    Worry:  Not on file    Inability: Not on file  . Transportation needs:    Medical: Not on file    Non-medical: Not on file  Tobacco Use  . Smoking status: Never Smoker  . Smokeless tobacco: Never Used  Substance and Sexual Activity  . Alcohol use: No  . Drug use: No  . Sexual activity: Not on file  Lifestyle  . Physical activity:    Days per week: Not on file    Minutes per session: Not on file  . Stress: Not on file  Relationships  . Social connections:    Talks on phone: Not on file    Gets together: Not on file    Attends religious service: Not on file    Active member of club or organization: Not on file    Attends meetings of clubs or organizations: Not on file    Relationship status: Not on file  . Intimate partner violence:    Fear of current or ex partner: Not on file    Emotionally abused: Not on file    Physically abused: Not on file    Forced sexual activity: Not on file  Other Topics Concern  . Not on file  Social History Narrative   Is a father of 3 with 3 grandchildren 3 great-grandchildren. He lives with one of his 2 grandsons.   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 He was a war 2 veteran in the [place]. 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   He never smoked. He does not drink alcohol.   He is very active, and a majority. He walks 30+ minutes every day. He also likes to play golf.   Functional Status Survey:    Allergies  Allergen Reactions  . Lisinopril Other (See Comments)    "Allergic," per the [facility]- patient is unaware of any reaction (??)    Pertinent  Health Maintenance Due  Topic Date Due  . INFLUENZA VACCINE  06/14/2018 (Originally 11/15/2017)  . PNA vac Low Risk Adult (1 of 2 - PCV13) 06/14/2018 (Originally [birth year]1989)    Medications: Outpatient Encounter Medications as of 05/23/2018  Medication Sig  . acetaminophen (TYLENOL) 325 MG tablet Take 2 tablets (650 mg total) by mouth 3 (three) times daily as needed (pain).  Marland Kitchen aspirin 325 MG tablet Take 1 tablet (325 mg total) by mouth daily.  Marland Kitchen atorvastatin (LIPITOR) 80 MG tablet Take 1 tablet (80 mg total) by mouth daily at 6 PM.  . calcium carbonate (OS-CAL - DOSED IN MG OF ELEMENTAL CALCIUM) 1250 (500 Ca) MG tablet Take 1 tablet (500 mg of elemental calcium total) by mouth daily with breakfast.  . HYDROcodone-acetaminophen (NORCO) 5-325 MG tablet Take 1 tablet by mouth every 6 (six) hours as needed for up to 14 days for moderate pain.  . isosorbide mononitrate (IMDUR) 30 MG 24 hr tablet Take 30 mg by mouth daily.  . metoprolol tartrate (LOPRESSOR) 25 MG tablet Take 0.5 tablets (12.5 mg total) by mouth 2 (two) times daily.  . Multiple Vitamin (MULTIVITAMIN WITH MINERALS) TABS tablet Take 1 tablet by mouth daily.  Marland Kitchen NITROSTAT 0.4 MG SL tablet PLACE 1 TABLET UNDER TONGUE EVERY 5 MINUTES FOR 3 DOSES AS NEEDED.  Marland Kitchen polyethylene glycol (MIRALAX / GLYCOLAX) packet Take 17 g by mouth 2 (two) times daily.  Marland Kitchen  Vitamin D, Ergocalciferol, (DRISDOL) 1.25 MG (50000 UT) CAPS capsule  Take 1 capsule (50,000 Units total) by mouth every 7 (seven) days.   No facility-administered encounter medications on file as of 05/23/2018.      Review of Systems   In general he is not complaining any fever or chills.  Skin does not complain of rashes or itching.  Head ears eyes nose mouth and throat is not complaining of sore throat or visual changes.  Respiratory does not complain of shortness of breath said before I entered the room he had a short period of wheezing which appears to have resolved.--Says he does occasionally have a cough  Cardiac does not complain of chest pain has trace lower extremity edema.  GI is not complaining of abdominal pain nausea vomiting diarrhea or constipation.  GU does not complain of dysuria  Musculoskeletal- currently not complaining of hip or joint pain but apparently did earlier today and did receive Norco apparently does not ask for this very often.  Neurologic does not complain of dizziness headache or syncope.  And psych is not complaining of depression or anxiety continues to be in usual good spirits   Vitals:   05/23/18 1454  BP: (!) 166/77  Pulse: 72  Resp: 20  Temp: (!) 97.3 F (36.3 C)  TempSrc: Oral  SpO2: 93%  Weight: 167 lb 9.6 oz (76 kg)  Height: 5\' 4"  (1.626 m)  I took manual blood pressure and got 138/60 Body mass index is 28.77 kg/m. Physical Exam  In general this is a pleasant elderly male who looks younger than his stated age. He appears comfortable.  His skin is warm and dry surgical site left hip appears to be benign without signs of infection.  Eyes visual acuity appears to be intact sclera and conjunctive are clear.  Oropharynx is clear he has numerous extractions mucous membranes moist.  Chest is clear to auscultation could not really appreciate any congestion or wheezing.  Heart is regular rate and rhythm without murmur gallop or rub he has trace lower extremity edema with positive pedal  pulses.  Abdomen is soft nontender with positive bowel sounds.  Musculoskeletal moves extremities at baseline except for limitations of his left lower extremity status post surgery says he largely is ambulating in a wheelchair.  Neurologic is grossly intact his speech is clear no lateralizing findings.  Psych he is alert and oriented very pleasant and appropriate   Labs reviewed: Basic Metabolic Panel: Recent Labs    05/10/18 1033 05/13/18 0420 05/15/18 0700  NA 137 135 138  K 4.1 4.7 4.4  CL 102 98 102  CO2 27 26 27   GLUCOSE 119* 98 101*  BUN 28* 29* 24*  CREATININE 1.64* 1.34* 1.16  CALCIUM 8.6* 8.6* 9.1   Liver Function Tests: No results for input(s): AST, ALT, ALKPHOS, BILITOT, PROT, ALBUMIN in the last 8760 hours. No results for input(s): LIPASE, AMYLASE in the last 8760 hours. No results for input(s): AMMONIA in the last 8760 hours. CBC: Recent Labs    05/07/18 1153  05/12/18 0640 05/13/18 0420 05/15/18 0700  WBC 10.6*   < > 9.9 9.5 10.9*  NEUTROABS 7.7  --   --   --  6.8  HGB 14.2   < > 11.6* 11.5* 12.6*  HCT 47.2   < > 37.7* 37.7* 42.2  MCV 92.7   < > 91.3 91.3 94.4  PLT 133*   < > 133* 138* 219   < > = values  in this interval not displayed.   Cardiac Enzymes: No results for input(s): CKTOTAL, CKMB, CKMBINDEX, TROPONINI in the last 8760 hours. BNP: Invalid input(s): POCBNP CBG: No results for input(s): GLUCAP in the last 8760 hours.  Procedures and Imaging Studies During Stay: Dg Chest 2 View  Result Date: 05/07/2018 CLINICAL DATA:  Fall and LEFT hip fracture. EXAM: CHEST - 2 VIEW COMPARISON:  11/30/2016 and prior chest radiographs FINDINGS: UPPER limits normal heart size noted. Chronic interstitial prominence and scarring bilaterally again noted. No new pulmonary abnormalities are identified. There is no evidence of pneumothorax or definite pleural effusion. No acute bony abnormalities are identified. IMPRESSION: 1. No evidence of acute abnormality. 2.  Chronic interstitial prominence and pulmonary scarring. Electronically Signed   By: Margarette Canada M.D.   On: 05/07/2018 12:34   Dg C-arm 1-60 Min  Result Date: 05/07/2018 CLINICAL DATA:  Hip fracture EXAM: OPERATIVE left HIP (WITH PELVIS IF PERFORMED) 4 VIEWS TECHNIQUE: Fluoroscopic spot image(s) were submitted for interpretation post-operatively. COMPARISON:  05/07/2017 FINDINGS: Four low resolution intraoperative spot views of the left hip. Total fluoroscopy time was 59 seconds. The images demonstrate intramedullary rod and screw fixation of left intertrochanteric fracture. IMPRESSION: Intraoperative fluoroscopic assistance provided during surgical fixation of left femur fracture Electronically Signed   By: Donavan Foil M.D.   On: 05/07/2018 19:48   Dg Hip Operative Unilat W Or W/o Pelvis Left  Result Date: 05/07/2018 CLINICAL DATA:  Hip fracture EXAM: OPERATIVE left HIP (WITH PELVIS IF PERFORMED) 4 VIEWS TECHNIQUE: Fluoroscopic spot image(s) were submitted for interpretation post-operatively. COMPARISON:  05/07/2017 FINDINGS: Four low resolution intraoperative spot views of the left hip. Total fluoroscopy time was 59 seconds. The images demonstrate intramedullary rod and screw fixation of left intertrochanteric fracture. IMPRESSION: Intraoperative fluoroscopic assistance provided during surgical fixation of left femur fracture Electronically Signed   By: Donavan Foil M.D.   On: 05/07/2018 19:48   Dg Hip Unilat W Or Wo Pelvis 2-3 Views Left  Result Date: 05/07/2018 CLINICAL DATA:  Fall EXAM: DG HIP (WITH OR WITHOUT PELVIS) 2-3V LEFT COMPARISON:  06/30/2016 FINDINGS: There is a minimally displaced fracture through the intertrochanteric region of the left femur. There is cortical step-off just proximal to the lesser trochanter. An intramedullary rod and dynamic compression screw are present in the proximal right femur. Osteopenia. Mild degenerative change of the SI and hip joints. No breakage or  loosening of the hardware. IMPRESSION: Acute minimally displaced left intertrochanteric femur fracture. Electronically Signed   By: Marybelle Killings M.D.   On: 05/07/2018 12:33    Assessment/Plan:    #1 history of left hip fracture with repair appears to have tolerated this well postop course was uncomplicated he did not require transfusion hemoglobin has shown stability was 12.6 on lab done on January 29.  He continues on aspirin 325 mg a day for DVT prophylaxis he is followed by orthopedics.  He has been started on Norco for pain since Tylenol is not totally effective will give him a very limited supply however since he does not ask for this much.--Will write for 10 tablets with no refills he will need expedient follow-up by orthopedics and primary care provider  He will be going home with his grandson will need continued PT and OT as well as wheelchair to assist with ambulation.  2.  History of vitamin D deficiency has been started on supplementation with suggested possibly begin a biphosphonate in about 6 weeks will defer to primary care provider.  3.  History of hypertension appears to have some variable systolics at times some elevations in the 150-160 range- since he is about to go home would be hesitant to add medications-- his hydrochlorothiazide and valsartan were discontinued in the hospital because of low blood pressures he is on Imdur and Lopressor I got 138/60 today at this point will defer to primary care provider  #4 history of hyperlipidemia he is on a statin since his stay here was quite sure was not aggressive pursuing a lipid panel.  5.  History of coronary artery disease on aspirin and a statin-Plavix apparently was discontinued previously by cardiology.  He does have a history of stenting of the left anterior descending artery.  6.  History of chronic kidney disease creatinine actually has shown improvement during his stay here at 1.16 will defer to primary care provider for  follow-up.  Again he will be going home with his grandson he will need PT and OT- as well as a wheelchair to assist with ambulation- he will need expedient orthopedic and primary care follow-up- in fact he had orthopedic follow-up yesterday and will go back in about 4 weeks  #7- wheezing?-Physical exam lung exam was quite benign this afternoon but this will have to be watched will order vital signs pulse ox every shift for 48 hours notify provider of any increased cough or congestion-or wheezing- also will write an order for PRN Robitussin if he does not have this already.  [id number] note greater than 30 minutes spent on this discharge summary- greater than 50% of time spent coordinating a plan of care for numerous diagnoses

## 2018-05-24 MED ORDER — HYDROCODONE-ACETAMINOPHEN 5-325 MG PO TABS: 1.0000 | ORAL_TABLET | Freq: Four times a day (QID) | ORAL | 0 refills | Status: AC | PRN

## 2018-05-27 DIAGNOSIS — N183 Chronic kidney disease, stage 3 (moderate): Secondary | ICD-10-CM | POA: Diagnosis not present

## 2018-05-27 DIAGNOSIS — S72141K Displaced intertrochanteric fracture of right femur, subsequent encounter for closed fracture with nonunion: Secondary | ICD-10-CM | POA: Diagnosis not present

## 2018-05-27 DIAGNOSIS — Z9181 History of falling: Secondary | ICD-10-CM | POA: Diagnosis not present

## 2018-05-27 DIAGNOSIS — I2510 Atherosclerotic heart disease of native coronary artery without angina pectoris: Secondary | ICD-10-CM | POA: Diagnosis not present

## 2018-05-27 DIAGNOSIS — E559 Vitamin D deficiency, unspecified: Secondary | ICD-10-CM | POA: Diagnosis not present

## 2018-05-27 DIAGNOSIS — Z955 Presence of coronary angioplasty implant and graft: Secondary | ICD-10-CM | POA: Diagnosis not present

## 2018-05-27 DIAGNOSIS — K219 Gastro-esophageal reflux disease without esophagitis: Secondary | ICD-10-CM | POA: Diagnosis not present

## 2018-05-27 DIAGNOSIS — Z7982 Long term (current) use of aspirin: Secondary | ICD-10-CM | POA: Diagnosis not present

## 2018-05-27 DIAGNOSIS — E785 Hyperlipidemia, unspecified: Secondary | ICD-10-CM | POA: Diagnosis not present

## 2018-05-27 DIAGNOSIS — I129 Hypertensive chronic kidney disease with stage 1 through stage 4 chronic kidney disease, or unspecified chronic kidney disease: Secondary | ICD-10-CM | POA: Diagnosis not present

## 2018-05-29 DIAGNOSIS — S72141K Displaced intertrochanteric fracture of right femur, subsequent encounter for closed fracture with nonunion: Secondary | ICD-10-CM | POA: Diagnosis not present

## 2018-05-29 DIAGNOSIS — E785 Hyperlipidemia, unspecified: Secondary | ICD-10-CM | POA: Diagnosis not present

## 2018-05-29 DIAGNOSIS — I129 Hypertensive chronic kidney disease with stage 1 through stage 4 chronic kidney disease, or unspecified chronic kidney disease: Secondary | ICD-10-CM | POA: Diagnosis not present

## 2018-05-29 DIAGNOSIS — E559 Vitamin D deficiency, unspecified: Secondary | ICD-10-CM | POA: Diagnosis not present

## 2018-05-29 DIAGNOSIS — Z7982 Long term (current) use of aspirin: Secondary | ICD-10-CM | POA: Diagnosis not present

## 2018-05-29 DIAGNOSIS — K219 Gastro-esophageal reflux disease without esophagitis: Secondary | ICD-10-CM | POA: Diagnosis not present

## 2018-05-29 DIAGNOSIS — Z9181 History of falling: Secondary | ICD-10-CM | POA: Diagnosis not present

## 2018-05-29 DIAGNOSIS — Z955 Presence of coronary angioplasty implant and graft: Secondary | ICD-10-CM | POA: Diagnosis not present

## 2018-05-29 DIAGNOSIS — I2510 Atherosclerotic heart disease of native coronary artery without angina pectoris: Secondary | ICD-10-CM | POA: Diagnosis not present

## 2018-05-29 DIAGNOSIS — N183 Chronic kidney disease, stage 3 (moderate): Secondary | ICD-10-CM | POA: Diagnosis not present

## 2018-05-30 DIAGNOSIS — S72141K Displaced intertrochanteric fracture of right femur, subsequent encounter for closed fracture with nonunion: Secondary | ICD-10-CM | POA: Diagnosis not present

## 2018-05-30 DIAGNOSIS — Z9181 History of falling: Secondary | ICD-10-CM | POA: Diagnosis not present

## 2018-05-30 DIAGNOSIS — N183 Chronic kidney disease, stage 3 (moderate): Secondary | ICD-10-CM | POA: Diagnosis not present

## 2018-05-30 DIAGNOSIS — E559 Vitamin D deficiency, unspecified: Secondary | ICD-10-CM | POA: Diagnosis not present

## 2018-05-30 DIAGNOSIS — I2510 Atherosclerotic heart disease of native coronary artery without angina pectoris: Secondary | ICD-10-CM | POA: Diagnosis not present

## 2018-05-30 DIAGNOSIS — Z7982 Long term (current) use of aspirin: Secondary | ICD-10-CM | POA: Diagnosis not present

## 2018-05-30 DIAGNOSIS — K219 Gastro-esophageal reflux disease without esophagitis: Secondary | ICD-10-CM | POA: Diagnosis not present

## 2018-05-30 DIAGNOSIS — I129 Hypertensive chronic kidney disease with stage 1 through stage 4 chronic kidney disease, or unspecified chronic kidney disease: Secondary | ICD-10-CM | POA: Diagnosis not present

## 2018-05-30 DIAGNOSIS — E785 Hyperlipidemia, unspecified: Secondary | ICD-10-CM | POA: Diagnosis not present

## 2018-05-30 DIAGNOSIS — Z955 Presence of coronary angioplasty implant and graft: Secondary | ICD-10-CM | POA: Diagnosis not present

## 2018-06-03 DIAGNOSIS — E785 Hyperlipidemia, unspecified: Secondary | ICD-10-CM | POA: Diagnosis not present

## 2018-06-03 DIAGNOSIS — I2510 Atherosclerotic heart disease of native coronary artery without angina pectoris: Secondary | ICD-10-CM | POA: Diagnosis not present

## 2018-06-03 DIAGNOSIS — E559 Vitamin D deficiency, unspecified: Secondary | ICD-10-CM | POA: Diagnosis not present

## 2018-06-03 DIAGNOSIS — S72141K Displaced intertrochanteric fracture of right femur, subsequent encounter for closed fracture with nonunion: Secondary | ICD-10-CM | POA: Diagnosis not present

## 2018-06-03 DIAGNOSIS — Z955 Presence of coronary angioplasty implant and graft: Secondary | ICD-10-CM | POA: Diagnosis not present

## 2018-06-03 DIAGNOSIS — Z9181 History of falling: Secondary | ICD-10-CM | POA: Diagnosis not present

## 2018-06-03 DIAGNOSIS — K219 Gastro-esophageal reflux disease without esophagitis: Secondary | ICD-10-CM | POA: Diagnosis not present

## 2018-06-03 DIAGNOSIS — I129 Hypertensive chronic kidney disease with stage 1 through stage 4 chronic kidney disease, or unspecified chronic kidney disease: Secondary | ICD-10-CM | POA: Diagnosis not present

## 2018-06-03 DIAGNOSIS — Z7982 Long term (current) use of aspirin: Secondary | ICD-10-CM | POA: Diagnosis not present

## 2018-06-03 DIAGNOSIS — N183 Chronic kidney disease, stage 3 (moderate): Secondary | ICD-10-CM | POA: Diagnosis not present

## 2018-06-04 DIAGNOSIS — Z9181 History of falling: Secondary | ICD-10-CM | POA: Diagnosis not present

## 2018-06-04 DIAGNOSIS — I129 Hypertensive chronic kidney disease with stage 1 through stage 4 chronic kidney disease, or unspecified chronic kidney disease: Secondary | ICD-10-CM | POA: Diagnosis not present

## 2018-06-04 DIAGNOSIS — K219 Gastro-esophageal reflux disease without esophagitis: Secondary | ICD-10-CM | POA: Diagnosis not present

## 2018-06-04 DIAGNOSIS — E559 Vitamin D deficiency, unspecified: Secondary | ICD-10-CM | POA: Diagnosis not present

## 2018-06-04 DIAGNOSIS — Z955 Presence of coronary angioplasty implant and graft: Secondary | ICD-10-CM | POA: Diagnosis not present

## 2018-06-04 DIAGNOSIS — E785 Hyperlipidemia, unspecified: Secondary | ICD-10-CM | POA: Diagnosis not present

## 2018-06-04 DIAGNOSIS — I2510 Atherosclerotic heart disease of native coronary artery without angina pectoris: Secondary | ICD-10-CM | POA: Diagnosis not present

## 2018-06-04 DIAGNOSIS — S72141K Displaced intertrochanteric fracture of right femur, subsequent encounter for closed fracture with nonunion: Secondary | ICD-10-CM | POA: Diagnosis not present

## 2018-06-04 DIAGNOSIS — N183 Chronic kidney disease, stage 3 (moderate): Secondary | ICD-10-CM | POA: Diagnosis not present

## 2018-06-04 DIAGNOSIS — Z7982 Long term (current) use of aspirin: Secondary | ICD-10-CM | POA: Diagnosis not present

## 2018-06-05 DIAGNOSIS — Z955 Presence of coronary angioplasty implant and graft: Secondary | ICD-10-CM | POA: Diagnosis not present

## 2018-06-05 DIAGNOSIS — I129 Hypertensive chronic kidney disease with stage 1 through stage 4 chronic kidney disease, or unspecified chronic kidney disease: Secondary | ICD-10-CM | POA: Diagnosis not present

## 2018-06-05 DIAGNOSIS — N183 Chronic kidney disease, stage 3 (moderate): Secondary | ICD-10-CM | POA: Diagnosis not present

## 2018-06-05 DIAGNOSIS — S72141K Displaced intertrochanteric fracture of right femur, subsequent encounter for closed fracture with nonunion: Secondary | ICD-10-CM | POA: Diagnosis not present

## 2018-06-05 DIAGNOSIS — Z7982 Long term (current) use of aspirin: Secondary | ICD-10-CM | POA: Diagnosis not present

## 2018-06-05 DIAGNOSIS — E559 Vitamin D deficiency, unspecified: Secondary | ICD-10-CM | POA: Diagnosis not present

## 2018-06-05 DIAGNOSIS — K219 Gastro-esophageal reflux disease without esophagitis: Secondary | ICD-10-CM | POA: Diagnosis not present

## 2018-06-05 DIAGNOSIS — Z9181 History of falling: Secondary | ICD-10-CM | POA: Diagnosis not present

## 2018-06-05 DIAGNOSIS — I2510 Atherosclerotic heart disease of native coronary artery without angina pectoris: Secondary | ICD-10-CM | POA: Diagnosis not present

## 2018-06-05 DIAGNOSIS — E785 Hyperlipidemia, unspecified: Secondary | ICD-10-CM | POA: Diagnosis not present

## 2018-06-06 DIAGNOSIS — I2510 Atherosclerotic heart disease of native coronary artery without angina pectoris: Secondary | ICD-10-CM | POA: Diagnosis not present

## 2018-06-06 DIAGNOSIS — Z7982 Long term (current) use of aspirin: Secondary | ICD-10-CM | POA: Diagnosis not present

## 2018-06-06 DIAGNOSIS — Z9181 History of falling: Secondary | ICD-10-CM | POA: Diagnosis not present

## 2018-06-06 DIAGNOSIS — I129 Hypertensive chronic kidney disease with stage 1 through stage 4 chronic kidney disease, or unspecified chronic kidney disease: Secondary | ICD-10-CM | POA: Diagnosis not present

## 2018-06-06 DIAGNOSIS — N183 Chronic kidney disease, stage 3 (moderate): Secondary | ICD-10-CM | POA: Diagnosis not present

## 2018-06-06 DIAGNOSIS — S72141K Displaced intertrochanteric fracture of right femur, subsequent encounter for closed fracture with nonunion: Secondary | ICD-10-CM | POA: Diagnosis not present

## 2018-06-06 DIAGNOSIS — E559 Vitamin D deficiency, unspecified: Secondary | ICD-10-CM | POA: Diagnosis not present

## 2018-06-06 DIAGNOSIS — E785 Hyperlipidemia, unspecified: Secondary | ICD-10-CM | POA: Diagnosis not present

## 2018-06-06 DIAGNOSIS — K219 Gastro-esophageal reflux disease without esophagitis: Secondary | ICD-10-CM | POA: Diagnosis not present

## 2018-06-06 DIAGNOSIS — Z955 Presence of coronary angioplasty implant and graft: Secondary | ICD-10-CM | POA: Diagnosis not present

## 2018-06-10 DIAGNOSIS — Z9181 History of falling: Secondary | ICD-10-CM | POA: Diagnosis not present

## 2018-06-10 DIAGNOSIS — K219 Gastro-esophageal reflux disease without esophagitis: Secondary | ICD-10-CM | POA: Diagnosis not present

## 2018-06-10 DIAGNOSIS — E559 Vitamin D deficiency, unspecified: Secondary | ICD-10-CM | POA: Diagnosis not present

## 2018-06-10 DIAGNOSIS — S72141K Displaced intertrochanteric fracture of right femur, subsequent encounter for closed fracture with nonunion: Secondary | ICD-10-CM | POA: Diagnosis not present

## 2018-06-10 DIAGNOSIS — N183 Chronic kidney disease, stage 3 (moderate): Secondary | ICD-10-CM | POA: Diagnosis not present

## 2018-06-10 DIAGNOSIS — I2510 Atherosclerotic heart disease of native coronary artery without angina pectoris: Secondary | ICD-10-CM | POA: Diagnosis not present

## 2018-06-10 DIAGNOSIS — I129 Hypertensive chronic kidney disease with stage 1 through stage 4 chronic kidney disease, or unspecified chronic kidney disease: Secondary | ICD-10-CM | POA: Diagnosis not present

## 2018-06-10 DIAGNOSIS — R0602 Shortness of breath: Secondary | ICD-10-CM | POA: Diagnosis not present

## 2018-06-10 DIAGNOSIS — Z7982 Long term (current) use of aspirin: Secondary | ICD-10-CM | POA: Diagnosis not present

## 2018-06-10 DIAGNOSIS — E785 Hyperlipidemia, unspecified: Secondary | ICD-10-CM | POA: Diagnosis not present

## 2018-06-10 DIAGNOSIS — Z955 Presence of coronary angioplasty implant and graft: Secondary | ICD-10-CM | POA: Diagnosis not present

## 2018-06-12 ENCOUNTER — Encounter (HOSPITAL_COMMUNITY): Payer: Self-pay

## 2018-06-12 ENCOUNTER — Inpatient Hospital Stay (HOSPITAL_COMMUNITY)
Admission: EM | Admit: 2018-06-12 | Discharge: 2018-06-15 | DRG: 291 | Disposition: A | Discharge status: Home Health | Payer: Medicare Other | Attending: Internal Medicine | Admitting: Internal Medicine

## 2018-06-12 ENCOUNTER — Other Ambulatory Visit: Payer: Self-pay

## 2018-06-12 ENCOUNTER — Inpatient Hospital Stay (HOSPITAL_COMMUNITY): Payer: Medicare Other

## 2018-06-12 ENCOUNTER — Emergency Department (HOSPITAL_COMMUNITY): Payer: Medicare Other

## 2018-06-12 DIAGNOSIS — I2699 Other pulmonary embolism without acute cor pulmonale: Secondary | ICD-10-CM | POA: Diagnosis present

## 2018-06-12 DIAGNOSIS — E785 Hyperlipidemia, unspecified: Secondary | ICD-10-CM

## 2018-06-12 DIAGNOSIS — I25119 Atherosclerotic heart disease of native coronary artery with unspecified angina pectoris: Secondary | ICD-10-CM

## 2018-06-12 DIAGNOSIS — Z8249 Family history of ischemic heart disease and other diseases of the circulatory system: Secondary | ICD-10-CM

## 2018-06-12 DIAGNOSIS — I351 Nonrheumatic aortic (valve) insufficiency: Secondary | ICD-10-CM | POA: Diagnosis present

## 2018-06-12 DIAGNOSIS — J8410 Pulmonary fibrosis, unspecified: Secondary | ICD-10-CM | POA: Diagnosis present

## 2018-06-12 DIAGNOSIS — K219 Gastro-esophageal reflux disease without esophagitis: Secondary | ICD-10-CM | POA: Diagnosis present

## 2018-06-12 DIAGNOSIS — R05 Cough: Secondary | ICD-10-CM | POA: Diagnosis not present

## 2018-06-12 DIAGNOSIS — R079 Chest pain, unspecified: Secondary | ICD-10-CM

## 2018-06-12 DIAGNOSIS — I10 Essential (primary) hypertension: Secondary | ICD-10-CM | POA: Diagnosis not present

## 2018-06-12 DIAGNOSIS — R778 Other specified abnormalities of plasma proteins: Secondary | ICD-10-CM

## 2018-06-12 DIAGNOSIS — I272 Pulmonary hypertension, unspecified: Secondary | ICD-10-CM | POA: Diagnosis not present

## 2018-06-12 DIAGNOSIS — E782 Mixed hyperlipidemia: Secondary | ICD-10-CM | POA: Diagnosis present

## 2018-06-12 DIAGNOSIS — Z841 Family history of disorders of kidney and ureter: Secondary | ICD-10-CM

## 2018-06-12 DIAGNOSIS — Z9861 Coronary angioplasty status: Secondary | ICD-10-CM | POA: Diagnosis not present

## 2018-06-12 DIAGNOSIS — I130 Hypertensive heart and chronic kidney disease with heart failure and stage 1 through stage 4 chronic kidney disease, or unspecified chronic kidney disease: Secondary | ICD-10-CM | POA: Diagnosis not present

## 2018-06-12 DIAGNOSIS — J849 Interstitial pulmonary disease, unspecified: Secondary | ICD-10-CM | POA: Diagnosis present

## 2018-06-12 DIAGNOSIS — J479 Bronchiectasis, uncomplicated: Secondary | ICD-10-CM | POA: Diagnosis present

## 2018-06-12 DIAGNOSIS — I110 Hypertensive heart disease with heart failure: Secondary | ICD-10-CM | POA: Diagnosis not present

## 2018-06-12 DIAGNOSIS — J9601 Acute respiratory failure with hypoxia: Secondary | ICD-10-CM | POA: Diagnosis not present

## 2018-06-12 DIAGNOSIS — I2510 Atherosclerotic heart disease of native coronary artery without angina pectoris: Secondary | ICD-10-CM | POA: Diagnosis not present

## 2018-06-12 DIAGNOSIS — R000 Tachycardia, unspecified: Secondary | ICD-10-CM | POA: Diagnosis not present

## 2018-06-12 DIAGNOSIS — I252 Old myocardial infarction: Secondary | ICD-10-CM | POA: Diagnosis not present

## 2018-06-12 DIAGNOSIS — I509 Heart failure, unspecified: Secondary | ICD-10-CM

## 2018-06-12 DIAGNOSIS — I5033 Acute on chronic diastolic (congestive) heart failure: Secondary | ICD-10-CM | POA: Diagnosis not present

## 2018-06-12 DIAGNOSIS — I444 Left anterior fascicular block: Secondary | ICD-10-CM | POA: Diagnosis not present

## 2018-06-12 DIAGNOSIS — Z7982 Long term (current) use of aspirin: Secondary | ICD-10-CM

## 2018-06-12 DIAGNOSIS — N183 Chronic kidney disease, stage 3 (moderate): Secondary | ICD-10-CM | POA: Diagnosis not present

## 2018-06-12 DIAGNOSIS — R7989 Other specified abnormal findings of blood chemistry: Secondary | ICD-10-CM | POA: Diagnosis not present

## 2018-06-12 DIAGNOSIS — R0602 Shortness of breath: Secondary | ICD-10-CM | POA: Diagnosis not present

## 2018-06-12 DIAGNOSIS — M1990 Unspecified osteoarthritis, unspecified site: Secondary | ICD-10-CM | POA: Diagnosis not present

## 2018-06-12 DIAGNOSIS — Z888 Allergy status to other drugs, medicaments and biological substances status: Secondary | ICD-10-CM | POA: Diagnosis not present

## 2018-06-12 LAB — CBC WITH DIFFERENTIAL/PLATELET
ABS IMMATURE GRANULOCYTES: 0.04 10*3/uL (ref 0.00–0.07)
Basophils Absolute: 0.1 10*3/uL (ref 0.0–0.1)
Basophils Relative: 1 %
Eosinophils Absolute: 0.1 10*3/uL (ref 0.0–0.5)
Eosinophils Relative: 0 %
HCT: 43.8 % (ref 39.0–52.0)
Hemoglobin: 13.3 g/dL (ref 13.0–17.0)
Immature Granulocytes: 0 %
LYMPHS ABS: 1.7 10*3/uL (ref 0.7–4.0)
Lymphocytes Relative: 13 %
MCH: 28.9 pg (ref 26.0–34.0)
MCHC: 30.4 g/dL (ref 30.0–36.0)
MCV: 95 fL (ref 80.0–100.0)
MONOS PCT: 10 %
Monocytes Absolute: 1.2 10*3/uL — ABNORMAL HIGH (ref 0.1–1.0)
Neutro Abs: 9.4 10*3/uL — ABNORMAL HIGH (ref 1.7–7.7)
Neutrophils Relative %: 76 %
Platelets: 144 10*3/uL — ABNORMAL LOW (ref 150–400)
RBC: 4.61 MIL/uL (ref 4.22–5.81)
RDW: 12.6 % (ref 11.5–15.5)
WBC: 12.4 10*3/uL — ABNORMAL HIGH (ref 4.0–10.5)
nRBC: 0 % (ref 0.0–0.2)

## 2018-06-12 LAB — ECHOCARDIOGRAM COMPLETE
Height: 64 in
Weight: 2680.79 oz

## 2018-06-12 LAB — BASIC METABOLIC PANEL
Anion gap: 10 (ref 5–15)
BUN: 23 mg/dL (ref 8–23)
CALCIUM: 8.9 mg/dL (ref 8.9–10.3)
CO2: 24 mmol/L (ref 22–32)
CREATININE: 1.22 mg/dL (ref 0.61–1.24)
Chloride: 104 mmol/L (ref 98–111)
GFR calc Af Amer: 58 mL/min — ABNORMAL LOW (ref >60)
GFR calc non Af Amer: 50 mL/min — ABNORMAL LOW (ref >60)
Glucose, Bld: 126 mg/dL — ABNORMAL HIGH (ref 70–99)
Potassium: 4.1 mmol/L (ref 3.5–5.1)
Sodium: 138 mmol/L (ref 135–145)

## 2018-06-12 LAB — TROPONIN I: Troponin I: 0.1 ng/mL (ref <0.03)

## 2018-06-12 LAB — URINALYSIS, ROUTINE W REFLEX MICROSCOPIC
BILIRUBIN URINE: NEGATIVE
Glucose, UA: NEGATIVE mg/dL
Hgb urine dipstick: NEGATIVE
Ketones, ur: NEGATIVE mg/dL
Leukocytes,Ua: NEGATIVE
Nitrite: NEGATIVE
Protein, ur: NEGATIVE mg/dL
SPECIFIC GRAVITY, URINE: 1.006 (ref 1.005–1.030)
pH: 6 (ref 5.0–8.0)

## 2018-06-12 LAB — PROTIME-INR
INR: 1 (ref 0.8–1.2)
Prothrombin Time: 13.3 seconds (ref 11.4–15.2)

## 2018-06-12 LAB — BRAIN NATRIURETIC PEPTIDE: B Natriuretic Peptide: 431 pg/mL — ABNORMAL HIGH (ref 0.0–100.0)

## 2018-06-12 MED ORDER — FUROSEMIDE 10 MG/ML IJ SOLN
40.0000 mg | Freq: Two times a day (BID) | INTRAMUSCULAR | Status: DC
  Administered 2018-06-12 – 2018-06-14 (×4): 40 mg via INTRAVENOUS
  Filled 2018-06-12 (×4): qty 4

## 2018-06-12 MED ORDER — HEPARIN SODIUM (PORCINE) 5000 UNIT/ML IJ SOLN
5000.0000 [IU] | Freq: Three times a day (TID) | INTRAMUSCULAR | Status: DC
  Administered 2018-06-12 – 2018-06-14 (×5): 5000 [IU] via SUBCUTANEOUS
  Filled 2018-06-12 (×5): qty 1

## 2018-06-12 MED ORDER — SODIUM CHLORIDE 0.9% FLUSH
3.0000 mL | Freq: Two times a day (BID) | INTRAVENOUS | Status: DC
  Administered 2018-06-12 – 2018-06-15 (×6): 3 mL via INTRAVENOUS

## 2018-06-12 MED ORDER — MORPHINE SULFATE (PF) 4 MG/ML IV SOLN: 4.0000 mg | INTRAVENOUS | Status: DC | PRN

## 2018-06-12 MED ORDER — ASPIRIN 81 MG PO CHEW
324.0000 mg | CHEWABLE_TABLET | Freq: Once | ORAL | Status: AC
  Administered 2018-06-12: 324 mg via ORAL
  Filled 2018-06-12: qty 4

## 2018-06-12 MED ORDER — ATORVASTATIN CALCIUM 40 MG PO TABS
80.0000 mg | ORAL_TABLET | Freq: Every day | ORAL | Status: DC
  Administered 2018-06-12 – 2018-06-15 (×4): 80 mg via ORAL
  Filled 2018-06-12 (×4): qty 2

## 2018-06-12 MED ORDER — ISOSORBIDE MONONITRATE ER 60 MG PO TB24
30.0000 mg | ORAL_TABLET | Freq: Every day | ORAL | Status: DC
  Administered 2018-06-12 – 2018-06-13 (×2): 30 mg via ORAL
  Filled 2018-06-12 (×2): qty 1

## 2018-06-12 MED ORDER — ASPIRIN 325 MG PO TABS
325.0000 mg | ORAL_TABLET | Freq: Every day | ORAL | Status: DC
  Administered 2018-06-12 – 2018-06-15 (×4): 325 mg via ORAL
  Filled 2018-06-12 (×4): qty 1

## 2018-06-12 MED ORDER — FUROSEMIDE 10 MG/ML IJ SOLN
40.0000 mg | Freq: Once | INTRAMUSCULAR | Status: AC
  Administered 2018-06-12: 40 mg via INTRAVENOUS
  Filled 2018-06-12: qty 4

## 2018-06-12 MED ORDER — SODIUM CHLORIDE 0.9 % IV SOLN: 250.0000 mL | INTRAVENOUS | Status: DC | PRN

## 2018-06-12 MED ORDER — SODIUM CHLORIDE 0.9% FLUSH: 3.0000 mL | INTRAVENOUS | Status: DC | PRN

## 2018-06-12 MED ORDER — SODIUM CHLORIDE 0.9% FLUSH
3.0000 mL | Freq: Once | INTRAVENOUS | Status: AC
  Administered 2018-06-12: 3 mL via INTRAVENOUS

## 2018-06-12 MED ORDER — ONDANSETRON HCL 4 MG/2ML IJ SOLN: 4.0000 mg | Freq: Four times a day (QID) | INTRAMUSCULAR | Status: DC | PRN

## 2018-06-12 MED ORDER — METOPROLOL TARTRATE 25 MG PO TABS
12.5000 mg | ORAL_TABLET | Freq: Two times a day (BID) | ORAL | Status: DC
  Administered 2018-06-12 – 2018-06-15 (×6): 12.5 mg via ORAL
  Filled 2018-06-12 (×6): qty 1

## 2018-06-12 MED ORDER — NITROGLYCERIN 0.4 MG SL SUBL
0.4000 mg | SUBLINGUAL_TABLET | SUBLINGUAL | Status: DC | PRN
  Administered 2018-06-12: 0.4 mg via SUBLINGUAL
  Filled 2018-06-12: qty 1

## 2018-06-12 MED ORDER — POLYETHYLENE GLYCOL 3350 17 G PO PACK
17.0000 g | PACK | Freq: Two times a day (BID) | ORAL | Status: DC
  Administered 2018-06-12 – 2018-06-15 (×6): 17 g via ORAL
  Filled 2018-06-12 (×6): qty 1

## 2018-06-12 MED ORDER — ACETAMINOPHEN 325 MG PO TABS: 650.0000 mg | ORAL_TABLET | ORAL | Status: DC | PRN

## 2018-06-12 NOTE — H&P (Signed)
History and Physical    Anthony Galvan BWG:[id number] DOB: [birth year] DOA: 06/12/2018  PCP: [name], MD  Patient coming from: Home  I have personally briefly reviewed patient's old medical records in [place]  Chief Complaint: Chest pain  HPI: Anthony Galvan is a 85 y.o. male with medical history significant of chronic diastolic congestive heart failure, coronary artery disease, hypertension hyperlipidemia, presents to the emergency room with with complaints of chest pain and dyspnea.  He is been having increasing fatigue, dyspnea on exertion and exertional chest pain.  Overnight, he developed a productive cough with associated chest pain.  Symptoms were improved with sublingual nitroglycerin.  He did note some lower extremity edema but feels that does not more than normal.  He was evaluated in the emergency room where he was noted to be mildly tachycardic.  EKG did not show any acute changes.  Troponin mildly elevated at 0.1.  Creatinine 1.22.  Chest x-ray consistent with pulmonary edema.  He received a dose of intravenous Lasix and has been referred for admission.  Review of Systems: As per HPI otherwise 10 point review of systems negative.    Past Medical History:  Diagnosis Date  . CAD S/P percutaneous coronary angioplasty 08/01/2012   PCI of mid LAD with a VeriFlex Bare Metal Stent 3.0 mm x 12 mm - post-dilated to 3.5 mm.;  Catheterization in July 2018 showed with severe heavily calcified distal circumflex-left PDA stenosis.  Not favorable for PCI.  . Duodenal ulcer May 2011   on EGD  . GERD (gastroesophageal reflux disease)   . Helicobacter pylori gastritis MAY 2012 HP STOOL AG NEG  . HTN (hypertension)   . Hyperlipidemia   . MI (myocardial infarction) ([place]) 1976, 1980, [place] [facility] at [facility] and [place] (Dr. Iona Beard)  . Osteoarthritis   . S/P colonoscopy 2011   normal, internal hemorrhoids    Past Surgical History:  Procedure Laterality Date   . CARDIAC CATHETERIZATION  01/30/2007   D1 75 %, mid LAD 50%, 50-70% circumflex, 50-70% RCA.(Dr. Adora Fridge)  . CARDIAC CATHETERIZATION  12/11/2001   normal L main, small RCA, dominant LL Cfx with mild diffuse disease, LAD with mid 10-20% stenosis, ramus intermedius with mild diffuse disease (Dr. Jackie Plum)  . CATARACT EXTRACTION    . COLONOSCOPY  OCT 2011 ARS   [facility] [place]   in setting of MI  . ESOPHAGOGASTRODUODENOSCOPY N/A 10/09/2016   Procedure: ESOPHAGOGASTRODUODENOSCOPY (EGD);  Surgeon: Danie Binder, MD;  [facility];  Service: Endoscopy;  Laterality: N/A;  . FEMUR IM NAIL Right 12/01/2016   Procedure: INTRAMEDULLARY (IM) NAIL RIGHT HIP;  Surgeon: Altamese Sevierville, MD;  Location: [facility];  Service: Orthopedics;  Laterality: Right;  . FEMUR IM NAIL Left 05/07/2018   Procedure: INTRAMEDULLARY (IM) NAIL FEMORAL;  Surgeon: Nicholes Stairs, MD;  Location: [facility];  Service: Orthopedics;  Laterality: Left;  . LEFT HEART CATH AND CORONARY ANGIOGRAPHY N/A 10/23/2016   Procedure: Left Heart Cath and Coronary Angiography;  Surgeon: Sherren Mocha, MD;  Location: [facility];  Service: Cardiovascular: Two-vessel CAD (left dominant).  Patent BMS in p LAD.  Severe, heavily calcified dCx-LPDA lesion.  Not favorable for PCI.  Marland Kitchen LEFT HEART CATHETERIZATION WITH CORONARY ANGIOGRAM N/A 08/01/2012   Procedure: LEFT HEART CATHETERIZATION WITH CORONARY ANGIOGRAM;  Surgeon: Leonie Man, MD;  Location: [facility] CATH LAB: Mid LAD 99% apple core; D2  ostial 70-80% (2 small for PCI) small nondominant RCA 60-70%. Distal circumflex/L PDA ~50% --> PCI of LAD  . NM MYOCAR PERF WALL MOTION  08/2003   adenosine stress - focal decreased perfusion defect in distal inferior wall, no significant ischemic changes  . PERCUTANEOUS STENT INTERVENTION  08/01/2012   Procedure: PERCUTANEOUS STENT INTERVENTION;  Surgeon: Leonie Man, MD;  Location: [facility] CATH LAB; ;PCI of mid LAD with a  VeriFlex Bare Metal Stent 3.0 mm x 12 mm - post-dilated to 3.5 mm.  . TRANSTHORACIC ECHOCARDIOGRAM  10/2016   mild LVH - 60-65%. Gr 1 DD. No RWMA. Mild MR. Mod TR. Mild RA dilation.  PAP ~ 60 mmHg.  Marland Kitchen UPPER GASTROINTESTINAL ENDOSCOPY  08/2009   MELENA, HEMATEMESIS --> DUODENAL ULCER, Bx: H PYLORI POS    Social History:  reports that he has never smoked. He has never used smokeless tobacco. He reports that he does not drink alcohol or use drugs.  Allergies  Allergen Reactions  . Lisinopril Other (See Comments)    "Allergic," per the [facility]- patient is unaware of any reaction (??)    Family History  Problem Relation Age of Onset  . Heart disease Mother   . Kidney disease Father   . Heart disease Brother 41  . Heart disease Sister   . Colon cancer Neg Hx     Prior to Admission medications   Medication Sig Start Date End Date Taking? Authorizing Provider  acetaminophen (TYLENOL) 325 MG tablet Take 2 tablets (650 mg total) by mouth 3 (three) times daily as needed (pain). 12/04/16  Yes Ainsley Spinner, PA-C  aspirin 325 MG tablet Take 1 tablet (325 mg total) by mouth daily. 05/13/18  Yes Mullis, Kiersten P, DO  atorvastatin (LIPITOR) 80 MG tablet Take 1 tablet (80 mg total) by mouth daily at 6 PM. 08/02/12  Yes Hager, Einar Pheasant, PA-C  calcium carbonate (OS-CAL - DOSED IN MG OF ELEMENTAL CALCIUM) 1250 (500 Ca) MG tablet Take 1 tablet (500 mg of elemental calcium total) by mouth daily with breakfast. 05/13/18  Yes Mullis, Kiersten P, DO  isosorbide mononitrate (IMDUR) 30 MG 24 hr tablet Take 30 mg by mouth daily.   Yes [provider]  metoprolol tartrate (LOPRESSOR) 25 MG tablet Take 0.5 tablets (12.5 mg total) by mouth 2 (two) times daily. 05/13/18  Yes Mullis, Kiersten P, DO  Multiple Vitamin (MULTIVITAMIN WITH MINERALS) TABS tablet Take 1 tablet by mouth daily.   Yes [provider]  NITROSTAT 0.4 MG SL tablet PLACE 1 TABLET UNDER TONGUE EVERY 5 MINUTES FOR 3 DOSES AS NEEDED.  11/17/13  Yes Leonie Man, MD  polyethylene glycol [facility] / Floria Raveling) packet Take 17 g by mouth 2 (two) times daily. 05/13/18  Yes Mullis, Kiersten P, DO  Vitamin D, Ergocalciferol, (DRISDOL) 1.25 MG (50000 UT) CAPS capsule Take 1 capsule (50,000 Units total) by mouth every 7 (seven) days. 05/16/18  Yes Danna Hefty, DO    Physical Exam: Vitals:   06/12/18 1330 06/12/18 1400 06/12/18 1430 06/12/18 1527  BP: (!) 164/81 (!) 144/73 (!) 153/74 (!) 154/81  Pulse:  91 95 (!) 115  Resp: (!) 21  (!) 25 18  Temp:    (!) 97.5 F (36.4 C)  TempSrc:    Oral  SpO2:  96% 97% 94%  Weight:    72.4 kg  Height:    5\' 4"  (1.626 m)    Constitutional: NAD, calm, comfortable Eyes: PERRL, lids and conjunctivae normal ENMT:  Mucous membranes are moist. Posterior pharynx clear of any exudate or lesions.Normal dentition.  Neck: normal, supple, no masses, no thyromegaly Respiratory: Bilateral crackles. Normal respiratory effort. No accessory muscle use.  Cardiovascular: Regular rate and rhythm, no murmurs / rubs / gallops.  Trace extremity edema. 2+ pedal pulses. No carotid bruits.  Abdomen: no tenderness, no masses palpated. No hepatosplenomegaly. Bowel sounds positive.  Musculoskeletal: no clubbing / cyanosis. No joint deformity upper and lower extremities. Good ROM, no contractures. Normal muscle tone.  Skin: no rashes, lesions, ulcers. No induration Neurologic: CN 2-12 grossly intact. Sensation intact, DTR normal. Strength 5/5 in all 4.  Psychiatric: Normal judgment and insight. Alert and oriented x 3. Normal mood.    Labs on Admission: I have personally reviewed following labs and imaging studies  CBC: Recent Labs  Lab 06/12/18 0917  WBC 12.4*  NEUTROABS 9.4*  HGB 13.3  HCT 43.8  MCV 95.0  PLT 324*   Basic Metabolic Panel: Recent Labs  Lab 06/12/18 0917  NA 138  K 4.1  CL 104  CO2 24  GLUCOSE 126*  BUN 23  CREATININE 1.22  CALCIUM 8.9   GFR: Estimated Creatinine  Clearance: 33.8 mL/min (by C-G formula based on SCr of 1.22 mg/dL). Liver Function Tests: No results for input(s): AST, ALT, ALKPHOS, BILITOT, PROT, ALBUMIN in the last 168 hours. No results for input(s): LIPASE, AMYLASE in the last 168 hours. No results for input(s): AMMONIA in the last 168 hours. Coagulation Profile: Recent Labs  Lab 06/12/18 0917  INR 1.0   Cardiac Enzymes: Recent Labs  Lab 06/12/18 0917  TROPONINI 0.10*   BNP (last 3 results) No results for input(s): PROBNP in the last 8760 hours. HbA1C: No results for input(s): HGBA1C in the last 72 hours. CBG: No results for input(s): GLUCAP in the last 168 hours. Lipid Profile: No results for input(s): CHOL, HDL, LDLCALC, TRIG, CHOLHDL, LDLDIRECT in the last 72 hours. Thyroid Function Tests: No results for input(s): TSH, T4TOTAL, FREET4, T3FREE, THYROIDAB in the last 72 hours. Anemia Panel: No results for input(s): VITAMINB12, FOLATE, FERRITIN, TIBC, IRON, RETICCTPCT in the last 72 hours. Urine analysis:    Component Value Date/Time   COLORURINE STRAW (A) 06/12/2018 1142   APPEARANCEUR CLEAR 06/12/2018 1142   APPEARANCEUR Clear 01/18/2017 1554   LABSPEC 1.006 06/12/2018 1142   PHURINE 6.0 06/12/2018 1142   GLUCOSEU NEGATIVE 06/12/2018 1142   HGBUR NEGATIVE 06/12/2018 1142   BILIRUBINUR NEGATIVE 06/12/2018 1142   BILIRUBINUR Negative 01/18/2017 1554   KETONESUR NEGATIVE 06/12/2018 1142   PROTEINUR NEGATIVE 06/12/2018 1142   UROBILINOGEN 1.0 08/27/2009 0830   NITRITE NEGATIVE 06/12/2018 1142   LEUKOCYTESUR NEGATIVE 06/12/2018 1142    Radiological Exams on Admission: Dg Chest Port 1 View  Result Date: 06/12/2018 CLINICAL DATA:  Onset chest pain, cough and shortness of breath yesterday. EXAM: PORTABLE CHEST 1 VIEW COMPARISON:  PA and lateral chest 05/07/2018. FINDINGS: There is cardiomegaly and bilateral airspace disease. No pneumothorax or pleural effusion. Aortic atherosclerosis noted. IMPRESSION: Bilateral  airspace disease most consistent with pulmonary edema in this patient with cardiomegaly. Electronically Signed   By: Inge Rise M.D.   On: 06/12/2018 09:46    EKG: Independently reviewed.  Sinus tachycardia without any acute changes  Assessment/Plan Active Problems:   Hyperlipidemia with target low density lipoprotein (LDL) cholesterol less than 70 mg/dL   Essential hypertension   CKD (chronic kidney disease) stage 3, GFR 30-59 ml/min (HCC)   Coronary artery disease involving native heart without  angina pectoris   Acute on chronic diastolic CHF (congestive heart failure) ([place])   Acute respiratory failure with hypoxia (HCC)     1. Acute respiratory failure with hypoxia.  Related to CHF and volume overload.  We will continue to wean off oxygen as patient diuresis. 2. Acute on chronic diastolic congestive heart failure.  Echocardiogram done on admission shows preserved ejection fraction.  Cardiology following, appreciate assistance.  Continue on IV Lasix.  Continue Imdur and beta-blockers.  Monitor urine output. 3. Chronic kidney disease stage III.  Creatinine currently at baseline at 1.2.  Continue to monitor in the setting of diuresis. 4. Coronary artery disease.  Patient has mildly elevated troponin of 0.1.  Suspect this is more of a demand ischemia in the setting of decompensated CHF.  Continue to trend troponin.  He is not having any chest pain at present. 5. Hyperlipidemia.  Continue statin 6. Hypertension.  Continue on beta-blockers and Imdur.  This should improve with further diuresis.  DVT prophylaxis: Heparin Code Status: Partial code, no chest compressions or defibrillation Family Communication: Discussed with patient Disposition Plan: Discharge home once improved Consults called: Cardiology Admission status: Inpatient, telemetry  Kathie Dike MD [facility]   If 7PM-7AM, please contact night-coverage [email or web address]   06/12/2018, 6:00 PM

## 2018-06-12 NOTE — Progress Notes (Signed)
*  PRELIMINARY RESULTS* Echocardiogram 2D Echocardiogram has been performed.  Anthony Galvan 06/12/2018, 2:50 PM

## 2018-06-12 NOTE — Consult Note (Addendum)
Cardiology Consult    Patient ID: RAMESES OU; [id number]; 03/21/1922   Admit date: 06/12/2018 Date of Consult: 06/12/2018  Primary Care Provider: [name], MD Primary Cardiologist: Glenetta Hew, MD   Patient Profile    Anthony Galvan is a 85 y.o. male with past medical history of CAD (s/p BMS to LAD in 2014, cath in 10/2016 showing patent stent with 80% distal-LCx stenosis which was unfavorable for PCI and medical management was recommended), HTN, HLD, and GERD who is being seen today for the evaluation of chest pain at the request of Dr. Thurnell Garbe.   History of Present Illness    Anthony Galvan was last examined by Jory Sims, DNP in 02/2018 and reported being less active at baseline due to a recent right hip fracture which was repaired. Reported minimal chest discomfort with ambulation but had not had to utilize SL NTG. Plavix was discontinued and he was continued on ASA, BB, statin, Hydralazine, Imdur, and Valsartan.    Was admitted to [facility] in 04/2018 for a mechanical fall and found to have a left hip fracture. Underwent placement on intramedullary nail on 1/21 and was discharged to SNF on 05/13/2018. Was discharged on ASA 325mg  daily for 6 weeks post-op for DVT prophylaxis.    He presented to [facility] ED on 06/12/2018 for evaluation of chest pain and dyspnea. Reports that he returned home from SNF several weeks ago and has been working with physical therapy multiple times per week. Since returning home, he reports having fatigue with his routine activities and has dyspnea on exertion but denies any exertional chest pain. Starting last night, he developed a productive cough and reports associated chest pain at that time. He did take a sublingual nitroglycerin with improvement in his symptoms.  Has baseline 2 pillow orthopnea but denies any recent change in this. Has noticed some lower extremity edema but not denies any pain along his lower legs. Unsure of his baseline  weight.  Initial labs show WBC 12.4, Hgb 13.3, platelets 144, Na+ 138, K+ 4.1, and creatinine 1.22. BNP elevated to 431. Initial troponin 0.10. CXR consistent with pulmonary edema. EKG shows sinus tachycardia, HR 116, with LAFB.   He did receive IV Lasix 40mg  while in the ED and has urinated 500 mL thus far in his bedside urinal.   Past Medical History:  Diagnosis Date  . CAD S/P percutaneous coronary angioplasty 08/01/2012   PCI of mid LAD with a VeriFlex Bare Metal Stent 3.0 mm x 12 mm - post-dilated to 3.5 mm.;  Catheterization in July 2018 showed with severe heavily calcified distal circumflex-left PDA stenosis.  Not favorable for PCI.  . Duodenal ulcer May 2011   on EGD  . GERD (gastroesophageal reflux disease)   . Helicobacter pylori gastritis MAY 2012 HP STOOL AG NEG  . HTN (hypertension)   . Hyperlipidemia   . MI (myocardial infarction) ([place]) 1976, 1980, [place] for at [facility] and [place] (Dr. Iona Beard)  . Osteoarthritis   . S/P colonoscopy 2011   normal, internal hemorrhoids    Past Surgical History:  Procedure Laterality Date  . CARDIAC CATHETERIZATION  01/30/2007   D1 75 %, mid LAD 50%, 50-70% circumflex, 50-70% RCA.(Dr. Adora Fridge)  . CARDIAC CATHETERIZATION  12/11/2001   normal L main, small RCA, dominant LL Cfx with mild diffuse disease, LAD with mid 10-20% stenosis, ramus intermedius with mild diffuse disease (Dr. Jackie Plum)  . CATARACT EXTRACTION    .  COLONOSCOPY  OCT 2011 ARS   [facility] [place]   in setting of MI  . ESOPHAGOGASTRODUODENOSCOPY N/A 10/09/2016   Procedure: ESOPHAGOGASTRODUODENOSCOPY (EGD);  Surgeon: Danie Binder, MD;  [facility];  Service: Endoscopy;  Laterality: N/A;  . FEMUR IM NAIL Right 12/01/2016   Procedure: INTRAMEDULLARY (IM) NAIL RIGHT HIP;  Surgeon: Altamese Oldham, MD;  Location: [place];  Service: Orthopedics;  Laterality: Right;  . FEMUR IM NAIL Left 05/07/2018   Procedure:  INTRAMEDULLARY (IM) NAIL FEMORAL;  Surgeon: Nicholes Stairs, MD;  Location: [place];  Service: Orthopedics;  Laterality: Left;  . LEFT HEART CATH AND CORONARY ANGIOGRAPHY N/A 10/23/2016   Procedure: Left Heart Cath and Coronary Angiography;  Surgeon: Sherren Mocha, MD;  Location: [place] CV LAB;  Service: Cardiovascular: Two-vessel CAD (left dominant).  Patent BMS in p LAD.  Severe, heavily calcified dCx-LPDA lesion.  Not favorable for PCI.  Marland Kitchen LEFT HEART CATHETERIZATION WITH CORONARY ANGIOGRAM N/A 08/01/2012   Procedure: LEFT HEART CATHETERIZATION WITH CORONARY ANGIOGRAM;  Surgeon: Leonie Man, MD;  Location: [facility] CATH LAB: Mid LAD 99% apple core; D2 ostial 70-80% (2 small for PCI) small nondominant RCA 60-70%. Distal circumflex/L PDA ~50% --> PCI of LAD  . NM MYOCAR PERF WALL MOTION  08/2003   adenosine stress - focal decreased perfusion defect in distal inferior wall, no significant ischemic changes  . PERCUTANEOUS STENT INTERVENTION  08/01/2012   Procedure: PERCUTANEOUS STENT INTERVENTION;  Surgeon: Leonie Man, MD;  Location: [facility] CATH LAB; ;PCI of mid LAD with a VeriFlex Bare Metal Stent 3.0 mm x 12 mm - post-dilated to 3.5 mm.  . TRANSTHORACIC ECHOCARDIOGRAM  10/2016   mild LVH - 60-65%. Gr 1 DD. No RWMA. Mild MR. Mod TR. Mild RA dilation.  PAP ~ 60 mmHg.  Marland Kitchen UPPER GASTROINTESTINAL ENDOSCOPY  08/2009   MELENA, HEMATEMESIS --> DUODENAL ULCER, Bx: H PYLORI POS     Home Medications:  Prior to Admission medications   Medication Sig Start Date End Date Taking? Authorizing Provider  acetaminophen (TYLENOL) 325 MG tablet Take 2 tablets (650 mg total) by mouth 3 (three) times daily as needed (pain). 12/04/16  Yes Ainsley Spinner, PA-C  aspirin 325 MG tablet Take 1 tablet (325 mg total) by mouth daily. 05/13/18  Yes Mullis, Kiersten P, DO  atorvastatin (LIPITOR) 80 MG tablet Take 1 tablet (80 mg total) by mouth daily at 6 PM. 08/02/12  Yes Hager, Einar Pheasant, PA-C  calcium carbonate (OS-CAL - DOSED IN  MG OF ELEMENTAL CALCIUM) 1250 (500 Ca) MG tablet Take 1 tablet (500 mg of elemental calcium total) by mouth daily with breakfast. 05/13/18  Yes Mullis, Kiersten P, DO  isosorbide mononitrate (IMDUR) 30 MG 24 hr tablet Take 30 mg by mouth daily.   Yes [provider]  metoprolol tartrate (LOPRESSOR) 25 MG tablet Take 0.5 tablets (12.5 mg total) by mouth 2 (two) times daily. 05/13/18  Yes Mullis, Kiersten P, DO  Multiple Vitamin (MULTIVITAMIN WITH MINERALS) TABS tablet Take 1 tablet by mouth daily.   Yes [provider]  NITROSTAT 0.4 MG SL tablet PLACE 1 TABLET UNDER TONGUE EVERY 5 MINUTES FOR 3 DOSES AS NEEDED. 11/17/13  Yes Leonie Man, MD  polyethylene glycol [facility] / Floria Raveling) packet Take 17 g by mouth 2 (two) times daily. 05/13/18  Yes Mullis, Kiersten P, DO  Vitamin D, Ergocalciferol, (DRISDOL) 1.25 MG (50000 UT) CAPS capsule Take 1 capsule (50,000 Units  total) by mouth every 7 (seven) days. 05/16/18  Yes Mullis, Kiersten P, DO    Inpatient Medications: Scheduled Meds:  Continuous Infusions:  PRN Meds: morphine injection, nitroGLYCERIN  Allergies:    Allergies  Allergen Reactions  . Lisinopril Other (See Comments)    "Allergic," per the [facility]- patient is unaware of any reaction (??)    Social History:   Social History   Socioeconomic History  . Marital status: Widowed    Spouse name: Not on file  . Number of children: 3  . Years of education: Not on file  . Highest education level: Not on file  Occupational History  . Not on file  Social Needs  . Financial resource strain: Not on file  . Food insecurity:    Worry: Not on file    Inability: Not on file  . Transportation needs:    Medical: Not on file    Non-medical: Not on file  Tobacco Use  . Smoking status: Never Smoker  . Smokeless tobacco: Never Used  Substance and Sexual Activity  . Alcohol use: No  . Drug use: No  . Sexual activity: Not on file  Lifestyle  . Physical activity:    Days  per week: Not on file    Minutes per session: Not on file  . Stress: Not on file  Relationships  . Social connections:    Talks on phone: Not on file    Gets together: Not on file    Attends religious service: Not on file    Active member of club or organization: Not on file    Attends meetings of clubs or organizations: Not on file    Relationship status: Not on file  . Intimate partner violence:    Fear of current or ex partner: Not on file    Emotionally abused: Not on file    Physically abused: Not on file    Forced sexual activity: Not on file  Other Topics Concern  . Not on file  Social History Narrative   Is a father of 3 with 3 grandchildren 3 great-grandchildren. He lives with one of his 2 grandsons.   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 He was a war 2 veteran in the [place]. 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   He never smoked. He does not drink alcohol.   He is very active, and a majority. He walks 30+ minutes every day. He also likes to play golf.     Family History:    Family History  Problem Relation Age of Onset  . Heart disease Mother   . Kidney disease Father   . Heart disease Brother 27  . Heart disease Sister   . Colon cancer Neg Hx       Review of Systems    General:  No chills, fever, night sweats or weight changes.  Cardiovascular:  No edema, orthopnea, palpitations, paroxysmal nocturnal dyspnea. Positive for chest pain and dyspnea on exertion.  Dermatological: No rash, lesions/masses Respiratory: Positive for productive cough and dyspnea. Urologic: No hematuria, dysuria Abdominal:   No nausea, vomiting, diarrhea, bright red blood per rectum, melena, or hematemesis Neurologic:  No visual changes, wkns, changes in mental status. All other systems reviewed and are otherwise negative except as noted above.  Physical  Exam/Data    Vitals:  06/12/18 0930 06/12/18 1000 06/12/18 1025 06/12/18 1030  BP: (!) 148/98 (!) 163/85 (!) 163/97 (!) 172/86  Pulse: (!) 105 99 97 94  Resp:   (!) 25 (!) 30  Temp:      TempSrc:      SpO2: 94% 95% 93% 95%  Weight:      Height:       No intake or output data in the 24 hours ending 06/12/18 1055 Filed Weights   06/12/18 0855  Weight: 76 kg   Body mass index is 28.76 kg/m.   General: Pleasant elderly African American male appearing in NAD. Appears younger than stated age.  Psych: Normal affect. Neuro: Alert and oriented X 3. Moves all extremities spontaneously. HEENT: Normal  Neck: Supple without bruits. JVD at 9cm. Lungs:  Resp regular and unlabored, decreased along bases bilaterally. Heart: Regular rhythm, tachycardiac rate. No s3, s4, or murmurs. Abdomen: Soft, non-tender, non-distended, BS + x 4.  Extremities: No clubbing or cyanosis. Trace lower extremity edema. DP/PT/Radials 2+ and equal bilaterally.   EKG:  The EKG was personally reviewed and demonstrates:  Sinus tachycardia, HR 116, with LAFB.   Telemetry:  Telemetry was personally reviewed and demonstrates: Sinus tachycardia, HR in 90's to low-100's.    Labs/Studies     Relevant CV Studies:  Echocardiogram: 10/2016 Study Conclusions  - Left ventricle: The cavity size was normal. Wall thickness was   increased in a pattern of mild LVH. Systolic function was normal.   The estimated ejection fraction was in the range of 60% to 65%.   Wall motion was normal; there were no regional wall motion   abnormalities. Doppler parameters are consistent with abnormal   left ventricular relaxation (grade 1 diastolic dysfunction). - Aortic valve: Moderately calcified annulus. Trileaflet. There was   mild regurgitation. - Mitral valve: There was mild regurgitation. - Right ventricle: The cavity size was mildly dilated. - Right atrium: The atrium was mildly dilated. - Tricuspid valve: There was moderate  regurgitation. - Pulmonary arteries: PA peak pressure: 60 mm Hg (S).  Cardiac Catheterization: 10/2016 1. Two-vessel coronary artery disease (left dominant) with patency of the stented segment in the proximal LAD and severe heavily calcified distal circumflex/left PDA stenosis unfavorable for PCI  2. Known normal LV function by noninvasive assessment  3. Normal LVEDP  Recommend: Medical therapy for CAD. The distal circumflex/PDA lesion is severely calcified with a distal vessel location that is unfavorable for PCI and I would be very hesitant to use atherectomy in this distal location.  Laboratory Data:  Chemistry Recent Labs  Lab 06/12/18 0917  NA 138  K 4.1  CL 104  CO2 24  GLUCOSE 126*  BUN 23  CREATININE 1.22  CALCIUM 8.9  GFRNONAA 50*  GFRAA 58*  ANIONGAP 10    No results for input(s): PROT, ALBUMIN, AST, ALT, ALKPHOS, BILITOT in the last 168 hours. Hematology Recent Labs  Lab 06/12/18 0917  WBC 12.4*  RBC 4.61  HGB 13.3  HCT 43.8  MCV 95.0  MCH 28.9  MCHC 30.4  RDW 12.6  PLT 144*   Cardiac Enzymes Recent Labs  Lab 06/12/18 0917  TROPONINI 0.10*   No results for input(s): TROPIPOC in the last 168 hours.  BNP Recent Labs  Lab 06/12/18 0917  BNP 431.0*    DDimer No results for input(s): DDIMER in the last 168 hours.  Radiology/Studies:  Dg Chest Port 1 View  Result Date: 06/12/2018 CLINICAL DATA:  Onset chest pain, cough and  shortness of breath yesterday. EXAM: PORTABLE CHEST 1 VIEW COMPARISON:  PA and lateral chest 05/07/2018. FINDINGS: There is cardiomegaly and bilateral airspace disease. No pneumothorax or pleural effusion. Aortic atherosclerosis noted. IMPRESSION: Bilateral airspace disease most consistent with pulmonary edema in this patient with cardiomegaly. Electronically Signed   By: Inge Rise M.D.   On: 06/12/2018 09:46     Assessment & Plan    1. Acute on Chronic Diastolic CHF Exacerbation - presents with worsening dyspnea  on exertion and reports associated productive coughing and chest pain during this time frame. Denies any exertional chest pain and episodes were occurring at the time of coughing so a likely pleuritic component. Does have baseline 2 pillow orthopnea.  - BNP elevated to 431 and CXR consistent with pulmonary edema. Would obtain updated echocardiogram. - He has already started to respond well to IV Lasix which was administered in the ED. Would start IV Lasix 40mg  BID upon admission. Repeat BMET in AM. Follow I&O's along with daily weights. He was previously on HCTZ but this was discontinued during his admission in 04/2018 due to soft BP. Suspect he will require re-initation of a standing low-dose diuretic.   2. CAD/ Elevated Troponin Values - he is s/p BMS to LAD in 2014 with cath in 10/2016 showing patent stent with 80% distal-LCx stenosis which was unfavorable for PCI and medical management was recommended.  - initial troponin minimally elevated at 0.10. Continue to cycle enzymes. If these trend upwards significantly, would start Heparin. Update echocardiogram to assess LV function and wall motion.  - would continue PTA ASA (on 325mg  daily in the post-op setting for 6 weeks per ortho recommendations), BB, Imdur, and statin therapy.   3. HTN - BP has been variable at 148/84 - 172/98 since arrival to the ED. Continue PTA Imdur and Lopressor at the time of admission.   4. HLD - followed by PCP as an outpatient. Remains on Atorvastatin 80mg  daily.   5. Recent Right Hip Fracture - reports progressing well with Home Health PT. Given recent surgery and decreased ambulatory status, consider checking a d-dimer to rule out PE if dyspnea does not improve with diuresis.    For questions or updates, please contact [place] Please consult [email or web address] for contact info under Cardiology/STEMI.  Signed, Erma Heritage, PA-C 06/12/2018, 10:55 AM Pager: [phone]   Attending note:  Records  reviewed and case discussed with Ms. Delano Metz, I agree with her findings.  Mr. Tassin presents with known history of CAD and previous BMS to the LAD in 2014 and residual 80% distal circumflex that was managed medically as of 2018.  He has been reasonably functional even at advanced age, although less active due to recent right hip fracture which was surgically repaired.  Unfortunately he had a mechanical fall with resulting left hip fracture that required surgical management in January and has been in SNF until recently.  He presents now with chest discomfort and shortness of breath after being home for a few weeks and doing physical therapy.  Symptoms became worse last night and he came to the ER.  He is in no acute distress at this point, systolic blood pressure 390Z and heart rate in the 90s in sinus rhythm by telemetry.  Breath sounds decreased at the bases, cardiac exam without gallop.  Only trace lower extremity edema.  Lab work shows potassium 4.1, BUN 23, creatinine 1.22, BNP 431, troponin I 0.10, WBC 12.4, hemoglobin 13.3, platelets 144.  Chest x-ray reports  bilateral airspace disease, likely edema.  I personally reviewed his ECG which shows sinus rhythm with left anterior fascicular block and LVH.  Patient presents with apparent acute on chronic diastolic heart failure.  Also states that he has had some coughing recently but no obvious fevers, chest pain also mostly present during episodes of coughing.  Initial troponin I is mildly increased at 0.10, would suspect demand ischemia pending further cycling of enzymes.  Hold off on heparin unless trend increases significantly.  Agree with IV diuresis as an inpatient and further titration of medical therapy as needed.  Continue to observe for other findings that might suggest infectious pulmonary process.  We will continue to follow with you.  Satira Sark, M.D., F.A.C.C.

## 2018-06-12 NOTE — ED Notes (Signed)
PT was informed that we need a urine sample. PT was given a urinal.

## 2018-06-12 NOTE — ED Provider Notes (Signed)
[facility] EMERGENCY DEPARTMENT Provider Note   CSN: [id number] Arrival date & time: 06/12/18  6269    History   Chief Complaint Chief Complaint  Patient presents with  . Chest Pain    HPI Anthony Galvan is a 85 y.o. male.     HPI  Pt was seen at 0900. Per pt, c/o gradual onset and worsening of persistent chest "pains" that began sometime overnight last night. States he has had SOB, coughing and generalized fatigue since yesterday. Pt states his symptoms worsen on exertion, improve slightly with resting. Pt unable to describe his chest pain, other than located in his mid-sternal area and "it pains." Denies palpitations, no fevers, no injury, no rash, no abd pain, no N/V/D, no back pain.    Past Medical History:  Diagnosis Date  . CAD S/P percutaneous coronary angioplasty 08/01/2012   PCI of mid LAD with a VeriFlex Bare Metal Stent 3.0 mm x 12 mm - post-dilated to 3.5 mm.;  Catheterization in July 2018 showed with severe heavily calcified distal circumflex-left PDA stenosis.  Not favorable for PCI.  . Duodenal ulcer May 2011   on EGD  . GERD (gastroesophageal reflux disease)   . Helicobacter pylori gastritis MAY 2012 HP STOOL AG NEG  . HTN (hypertension)   . Hyperlipidemia   . MI (myocardial infarction) ([place]) 1976, 1980, [place] for at [facility] and [place] (Dr. Iona Beard)  . Osteoarthritis   . S/P colonoscopy 2011   normal, internal hemorrhoids    Patient Active Problem List   Diagnosis Date Noted  . Fracture 05/07/2018  . Closed displaced intertrochanteric fracture of left femur ([place])   . Coronary artery disease involving native heart without angina pectoris   . Dyspepsia 01/18/2017  . Closed disp intertrochanteric fracture of right femur with nonunion 11/30/2016  . PUD (peptic ulcer disease)   . History of coronary artery stent placement 10/08/2016  . Acute on chronic renal failure ([place]) 10/08/2016  . CKD (chronic kidney disease) stage 3, GFR 30-59  ml/min (HCC) 10/08/2016  . Bloating 09/03/2013  . CAD S/P percutaneous coronary angioplasty   . MI (myocardial infarction) ([place])   . Presence of bare metal stent in LAD coronary artery - VerifFlex BMS 3.0 mm x 12 mm - post-dilated to 3.5 mm 08/01/2012  . Unstable angina ([place]) 07/30/2012  . Gastritis 12/15/2010  . Nausea 08/30/2010  . Hyperlipidemia with target low density lipoprotein (LDL) cholesterol less than 70 mg/dL 05/21/2006  . CATARACT NOS 05/21/2006  . Essential hypertension 05/21/2006  . MYOCARDIAL INFARCTION, HX OF 05/21/2006  . GERD 05/21/2006  . OVERACTIVE BLADDER 05/21/2006  . OSTEOARTHRITIS 05/21/2006  . URINARY INCONTINENCE 05/21/2006    Past Surgical History:  Procedure Laterality Date  . CARDIAC CATHETERIZATION  01/30/2007   D1 75 %, mid LAD 50%, 50-70% circumflex, 50-70% RCA.(Dr. Adora Fridge)  . CARDIAC CATHETERIZATION  12/11/2001   normal L main, small RCA, dominant LL Cfx with mild diffuse disease, LAD with mid 10-20% stenosis, ramus intermedius with mild diffuse disease (Dr. Jackie Plum)  . CATARACT EXTRACTION    . COLONOSCOPY  OCT 2011 ARS   [facility] [place]   in setting of MI  . ESOPHAGOGASTRODUODENOSCOPY N/A 10/09/2016   Procedure: ESOPHAGOGASTRODUODENOSCOPY (EGD);  Surgeon: Danie Binder, MD;  Location[facility];  Service: Endoscopy;  Laterality: N/A;  . FEMUR IM NAIL Right 12/01/2016   Procedure: INTRAMEDULLARY (IM) NAIL RIGHT HIP;  Surgeon: Altamese Orland, MD;  Location: [place];  Service: Orthopedics;  Laterality: Right;  . FEMUR IM NAIL Left 05/07/2018   Procedure: INTRAMEDULLARY (IM) NAIL FEMORAL;  Surgeon: Nicholes Stairs, MD;  Location: [place];  Service: Orthopedics;  Laterality: Left;  . LEFT HEART CATH AND CORONARY ANGIOGRAPHY N/A 10/23/2016   Procedure: Left Heart Cath and Coronary Angiography;  Surgeon: Sherren Mocha, MD;  Location: [place] CV LAB;  Service: Cardiovascular: Two-vessel CAD (left dominant).  Patent  BMS in p LAD.  Severe, heavily calcified dCx-LPDA lesion.  Not favorable for PCI.  Marland Kitchen LEFT HEART CATHETERIZATION WITH CORONARY ANGIOGRAM N/A 08/01/2012   Procedure: LEFT HEART CATHETERIZATION WITH CORONARY ANGIOGRAM;  Surgeon: Leonie Man, MD;  Location: [facility] CATH LAB: Mid LAD 99% apple core; D2 ostial 70-80% (2 small for PCI) small nondominant RCA 60-70%. Distal circumflex/L PDA ~50% --> PCI of LAD  . NM MYOCAR PERF WALL MOTION  08/2003   adenosine stress - focal decreased perfusion defect in distal inferior wall, no significant ischemic changes  . PERCUTANEOUS STENT INTERVENTION  08/01/2012   Procedure: PERCUTANEOUS STENT INTERVENTION;  Surgeon: Leonie Man, MD;  Location: [facility] CATH LAB; ;PCI of mid LAD with a VeriFlex Bare Metal Stent 3.0 mm x 12 mm - post-dilated to 3.5 mm.  . TRANSTHORACIC ECHOCARDIOGRAM  10/2016   mild LVH - 60-65%. Gr 1 DD. No RWMA. Mild MR. Mod TR. Mild RA dilation.  PAP ~ 60 mmHg.  Marland Kitchen UPPER GASTROINTESTINAL ENDOSCOPY  08/2009   MELENA, HEMATEMESIS --> DUODENAL ULCER, Bx: H PYLORI POS        Home Medications    Prior to Admission medications   Medication Sig Start Date End Date Taking? Authorizing Provider  acetaminophen (TYLENOL) 325 MG tablet Take 2 tablets (650 mg total) by mouth 3 (three) times daily as needed (pain). 12/04/16   Ainsley Spinner, PA-C  aspirin 325 MG tablet Take 1 tablet (325 mg total) by mouth daily. 05/13/18   Mullis, Kiersten P, DO  atorvastatin (LIPITOR) 80 MG tablet Take 1 tablet (80 mg total) by mouth daily at 6 PM. 08/02/12   Brett Canales, PA-C  calcium carbonate (OS-CAL - DOSED IN MG OF ELEMENTAL CALCIUM) 1250 (500 Ca) MG tablet Take 1 tablet (500 mg of elemental calcium total) by mouth daily with breakfast. 05/13/18   Mullis, Kiersten P, DO  isosorbide mononitrate (IMDUR) 30 MG 24 hr tablet Take 30 mg by mouth daily.    [provider]  metoprolol tartrate (LOPRESSOR) 25 MG tablet Take 0.5 tablets (12.5 mg total) by mouth 2 (two) times  daily. 05/13/18   Mullis, Kiersten P, DO  Multiple Vitamin (MULTIVITAMIN WITH MINERALS) TABS tablet Take 1 tablet by mouth daily.    [provider]  NITROSTAT 0.4 MG SL tablet PLACE 1 TABLET UNDER TONGUE EVERY 5 MINUTES FOR 3 DOSES AS NEEDED. 11/17/13   Leonie Man, MD  polyethylene glycol [facility] Floria Raveling) packet Take 17 g by mouth 2 (two) times daily. 05/13/18   Mullis, Kiersten P, DO  Vitamin D, Ergocalciferol, (DRISDOL) 1.25 MG (50000 UT) CAPS capsule Take 1 capsule (50,000 Units total) by mouth every 7 (seven) days. 05/16/18   Mullis, Archie Endo, DO    Family History Family History  Problem Relation Age of Onset  . Heart disease Mother   . Kidney disease Father   . Heart disease Brother 85  . Heart disease Sister   . Colon cancer Neg Hx  Social History Social History   Tobacco Use  . Smoking status: Never Smoker  . Smokeless tobacco: Never Used  Substance Use Topics  . Alcohol use: No  . Drug use: No     Allergies   Lisinopril   Review of Systems Review of Systems ROS: Statement: All systems negative except as marked or noted in the HPI; Constitutional: Negative for fever and chills. ; ; Eyes: Negative for eye pain, redness and discharge. ; ; ENMT: Negative for ear pain, hoarseness, nasal congestion, sinus pressure and sore throat. ; ; Cardiovascular: +CP, SOB. Negative for palpitations, diaphoresis, and peripheral edema. ; ; Respiratory: +cough. Negative for wheezing and stridor. ; ; Gastrointestinal: Negative for nausea, vomiting, diarrhea, abdominal pain, blood in stool, hematemesis, jaundice and rectal bleeding. . ; ; Genitourinary: Negative for dysuria, flank pain and hematuria. ; ; Musculoskeletal: Negative for back pain and neck pain. Negative for swelling and trauma.; ; Skin: Negative for pruritus, rash, abrasions, blisters, bruising and skin lesion.; ; Neuro: Negative for headache, lightheadedness and neck stiffness. Negative for weakness, altered  level of consciousness, altered mental status, extremity weakness, paresthesias, involuntary movement, seizure and syncope.       Physical Exam Updated Vital Signs BP (!) 166/91 (BP Location: Right Arm)   Pulse (!) 106   Temp 98.1 F (36.7 C) (Oral)   Resp (!) 24   Ht 5\' 4"  (1.626 m)   Wt 76 kg   SpO2 92% Comment: on 2 L  BMI 28.76 kg/m    Patient Vitals for the past 24 hrs:  BP Temp Temp src Pulse Resp SpO2 Height Weight  06/12/18 0855 - - - - - - 5\' 4"  (1.626 m) 76 kg  06/12/18 0854 (!) 166/91 98.1 F (36.7 C) Oral (!) 106 (!) 24 92 % - -     Physical Exam 0905: Physical examination:  Nursing notes reviewed; Vital signs and O2 SAT reviewed;  Constitutional: Well developed, Well nourished, Well hydrated, In no acute distress; Head:  Normocephalic, atraumatic; Eyes: EOMI, PERRL, No scleral icterus; ENMT: Mouth and pharynx normal, Mucous membranes moist; Neck: Supple, Full range of motion, No lymphadenopathy; Cardiovascular: Tachycardic rate and rhythm, No gallop; Respiratory: Breath sounds clear & equal bilaterally, No wheezes.  Speaking full sentences with ease, Normal respiratory effort/excursion; Chest: Nontender, Movement normal; Abdomen: Soft, Nontender, Nondistended, Normal bowel sounds; Genitourinary: No CVA tenderness; Extremities: Peripheral pulses normal, No tenderness, +1 pedal edema bilat. No calf edema or asymmetry.; Neuro: AA&Ox3, Major CN grossly intact.  Speech clear. No gross focal motor or sensory deficits in extremities.; Skin: Color normal, Warm, Dry.    ED Treatments / Results  Labs (all labs ordered are listed, but only abnormal results are displayed)   EKG EKG Interpretation  Date/Time:  Wednesday June 12 2018 08:47:57 EST Ventricular Rate:  116 PR Interval:    QRS Duration: 86 QT Interval:  317 QTC Calculation: 441 R Axis:   -55 Text Interpretation:  Sinus tachycardia Left anterior fascicular block Abnormal R-wave progression, early  transition Left ventricular hypertrophy ST elevation, consider inferior injury Nonspecific ST and T wave abnormality Lateral leads When compared with ECG of 05/07/2018 Nonspecific ST and T wave abnormality is now Present If clinical findings warrant, repeat tracings suggested Confirmed by Francine Graven [phone]) on 06/12/2018 10:09:58 AM      EKG Interpretation  Date/Time:  Wednesday June 12 2018 09:01:19 EST Ventricular Rate:  105 PR Interval:    QRS Duration: 84 QT Interval:  337 QTC  Calculation: 446 R Axis:   -56 Text Interpretation:  Sinus tachycardia Consider right atrial enlargement Left anterior fascicular block Abnormal R-wave progression, early transition Left ventricular hypertrophy Since last tracing of earlier today No significant change was found Confirmed by Francine Graven [phone]) on 06/12/2018 10:10:52 AM          Radiology   Procedures Procedures (including critical care time)  Medications Ordered in ED Medications  sodium chloride flush (NS) 0.9 % injection 3 mL (has no administration in time range)  nitroGLYCERIN (NITROSTAT) SL tablet 0.4 mg (0.4 mg Sublingual Given 06/12/18 0921)  morphine 4 MG/ML injection 4 mg (has no administration in time range)  aspirin chewable tablet 324 mg (324 mg Oral Given 06/12/18 0921)     Initial Impression / Assessment and Plan / ED Course  I have reviewed the triage vital signs and the nursing notes.  Pertinent labs & imaging results that were available during my care of the patient were reviewed by me and considered in my medical decision making (see chart for details).     MDM Reviewed: previous chart, nursing note and vitals Reviewed previous: labs and ECG Interpretation: labs, ECG and x-ray   Results for orders placed or performed during the hospital encounter of 02/09/84  Basic metabolic panel  Result Value Ref Range   Sodium 138 135 - 145 mmol/L   Potassium 4.1 3.5 - 5.1 mmol/L   Chloride 104 98 - 111  mmol/L   CO2 24 22 - 32 mmol/L   Glucose, Bld 126 (H) 70 - 99 mg/dL   BUN 23 8 - 23 mg/dL   Creatinine, Ser 1.22 0.61 - 1.24 mg/dL   Calcium 8.9 8.9 - 10.3 mg/dL   GFR calc non Af Amer 50 (L) >60 mL/min   GFR calc Af Amer 58 (L) >60 mL/min   Anion gap 10 5 - 15  Brain natriuretic peptide  Result Value Ref Range   B Natriuretic Peptide 431.0 (H) 0.0 - 100.0 pg/mL  Troponin I - Once  Result Value Ref Range   Troponin I 0.10 (HH) <0.03 ng/mL  CBC with Differential  Result Value Ref Range   WBC 12.4 (H) 4.0 - 10.5 K/uL   RBC 4.61 4.22 - 5.81 MIL/uL   Hemoglobin 13.3 13.0 - 17.0 g/dL   HCT 43.8 39.0 - 52.0 %   MCV 95.0 80.0 - 100.0 fL   MCH 28.9 26.0 - 34.0 pg   MCHC 30.4 30.0 - 36.0 g/dL   RDW 12.6 11.5 - 15.5 %   Platelets 144 (L) 150 - 400 K/uL   nRBC 0.0 0.0 - 0.2 %   Neutrophils Relative % 76 %   Neutro Abs 9.4 (H) 1.7 - 7.7 K/uL   Lymphocytes Relative 13 %   Lymphs Abs 1.7 0.7 - 4.0 K/uL   Monocytes Relative 10 %   Monocytes Absolute 1.2 (H) 0.1 - 1.0 K/uL   Eosinophils Relative 0 %   Eosinophils Absolute 0.1 0.0 - 0.5 K/uL   Basophils Relative 1 %   Basophils Absolute 0.1 0.0 - 0.1 K/uL   Immature Granulocytes 0 %   Abs Immature Granulocytes 0.04 0.00 - 0.07 K/uL  Protime-INR  Result Value Ref Range   Prothrombin Time 13.3 11.4 - 15.2 seconds   INR 1.0 0.8 - 1.2   Dg Chest Port 1 View Result Date: 06/12/2018 CLINICAL DATA:  Onset chest pain, cough and shortness of breath yesterday. EXAM: PORTABLE CHEST 1 VIEW COMPARISON:  PA and  lateral chest 05/07/2018. FINDINGS: There is cardiomegaly and bilateral airspace disease. No pneumothorax or pleural effusion. Aortic atherosclerosis noted. IMPRESSION: Bilateral airspace disease most consistent with pulmonary edema in this patient with cardiomegaly. Electronically Signed   By: Inge Rise M.D.   On: 06/12/2018 09:46    1025:  ASA, SL ntg with improvement in CP. IV lasix given for elevated BNP and pulmonary edema on CXR.  Sats on arrival 80% R/A, increased and maintained 93-95% on O2 2L N/C.  T/C returned from [facility] Dr. Domenic Polite, case discussed, including:  HPI, pertinent PM/SHx, VS/PE, dx testing, ED course and treatment:  Agrees with ED treatment, no acute intervention needed at this time, hold heparin for now, needs enzymes cycled and diuresis, OK to stay at [facility].  1105:  T/C returned from [facility] Dr. Roderic Palau, case discussed, including:  HPI, pertinent PM/SHx, VS/PE, dx testing, ED course and treatment:  Agreeable to admit.        Final Clinical Impressions(s) / ED Diagnoses   Final diagnoses:  None    ED Discharge Orders    None       Francine Graven, DO 06/14/18 [phone]

## 2018-06-12 NOTE — ED Notes (Signed)
CRITICAL VALUE ALERT  Critical Value:  troponin  0.10  Date & Time Notied:  1005 06/12/18  Provider Notified: [place]  Orders Received/Actions taken:

## 2018-06-12 NOTE — ED Triage Notes (Signed)
Pt reports CP that began during the night. Reports cough and SOB that began yesterday. O2 sats 80 % initially on room air. Increase to 95 % on 2 L

## 2018-06-13 ENCOUNTER — Inpatient Hospital Stay (HOSPITAL_COMMUNITY): Payer: Medicare Other

## 2018-06-13 DIAGNOSIS — I2699 Other pulmonary embolism without acute cor pulmonale: Secondary | ICD-10-CM

## 2018-06-13 DIAGNOSIS — J849 Interstitial pulmonary disease, unspecified: Secondary | ICD-10-CM

## 2018-06-13 DIAGNOSIS — J479 Bronchiectasis, uncomplicated: Secondary | ICD-10-CM

## 2018-06-13 HISTORY — DX: Interstitial pulmonary disease, unspecified: J84.9

## 2018-06-13 HISTORY — DX: Other pulmonary embolism without acute cor pulmonale: I26.99

## 2018-06-13 HISTORY — DX: Bronchiectasis, uncomplicated: J47.9

## 2018-06-13 LAB — BASIC METABOLIC PANEL
Anion gap: 9 (ref 5–15)
BUN: 25 mg/dL — ABNORMAL HIGH (ref 8–23)
CALCIUM: 8.7 mg/dL — AB (ref 8.9–10.3)
CO2: 29 mmol/L (ref 22–32)
Chloride: 100 mmol/L (ref 98–111)
Creatinine, Ser: 1.38 mg/dL — ABNORMAL HIGH (ref 0.61–1.24)
GFR calc Af Amer: 50 mL/min — ABNORMAL LOW (ref >60)
GFR, EST NON AFRICAN AMERICAN: 43 mL/min — AB (ref >60)
Glucose, Bld: 103 mg/dL — ABNORMAL HIGH (ref 70–99)
Potassium: 4 mmol/L (ref 3.5–5.1)
Sodium: 138 mmol/L (ref 135–145)

## 2018-06-13 LAB — GLUCOSE, CAPILLARY: Glucose-Capillary: 101 mg/dL — ABNORMAL HIGH (ref 70–99)

## 2018-06-13 LAB — TROPONIN I: Troponin I: 0.1 ng/mL (ref <0.03)

## 2018-06-13 MED ORDER — IOPAMIDOL (ISOVUE-370) INJECTION 76%
100.0000 mL | Freq: Once | INTRAVENOUS | Status: AC | PRN
  Administered 2018-06-13: 100 mL via INTRAVENOUS

## 2018-06-13 MED ORDER — ISOSORBIDE MONONITRATE ER 60 MG PO TB24
60.0000 mg | ORAL_TABLET | Freq: Every day | ORAL | Status: DC
  Administered 2018-06-14 – 2018-06-15 (×2): 60 mg via ORAL
  Filled 2018-06-13 (×2): qty 1

## 2018-06-13 MED ORDER — IOHEXOL 350 MG/ML SOLN: 100.0000 mL | Freq: Once | INTRAVENOUS | Status: DC | PRN

## 2018-06-13 MED ORDER — LEVALBUTEROL HCL 0.63 MG/3ML IN NEBU: 0.6300 mg | INHALATION_SOLUTION | Freq: Four times a day (QID) | RESPIRATORY_TRACT | Status: DC | PRN

## 2018-06-13 MED ORDER — LEVALBUTEROL HCL 0.63 MG/3ML IN NEBU
0.6300 mg | INHALATION_SOLUTION | Freq: Three times a day (TID) | RESPIRATORY_TRACT | Status: DC
  Administered 2018-06-13: 0.63 mg via RESPIRATORY_TRACT
  Filled 2018-06-13: qty 3

## 2018-06-13 NOTE — Progress Notes (Addendum)
Progress Note  Patient Name: Anthony Galvan Date of Encounter: 06/13/2018  Primary Cardiologist: Glenetta Hew, MD   Subjective   Breathing has improved but he has not yet ambulated around the room and his biggest issue was dyspnea on exertion. Denies any specific chest pain but does have a mild aching sensation along his epigastric region. No orthopnea or PND overnight. Says he has been wheezing for the past few hours.   Inpatient Medications    Scheduled Meds: . aspirin  325 mg Oral Daily  . atorvastatin  80 mg Oral q1800  . furosemide  40 mg Intravenous BID  . heparin  5,000 Units Subcutaneous Q8H  . [START ON 06/14/2018] isosorbide mononitrate  60 mg Oral Daily  . levalbuterol  0.63 mg Nebulization Q8H  . metoprolol tartrate  12.5 mg Oral BID  . polyethylene glycol  17 g Oral BID  . sodium chloride flush  3 mL Intravenous Q12H   Continuous Infusions: . sodium chloride     PRN Meds: sodium chloride, acetaminophen, nitroGLYCERIN, ondansetron (ZOFRAN) IV, sodium chloride flush   Vital Signs    Vitals:   06/12/18 2013 06/12/18 2146 06/13/18 0500 06/13/18 0515  BP:  (!) 146/75  128/68  Pulse:  80  72  Resp:    18  Temp:  98.5 F (36.9 C)  98 F (36.7 C)  TempSrc:  Oral  Oral  SpO2: 97% 99%  95%  Weight:   71.9 kg   Height:        Intake/Output Summary (Last 24 hours) at 06/13/2018 1051 Last data filed at 06/13/2018 0824 Gross per 24 hour  Intake 240 ml  Output 700 ml  Net -460 ml   Filed Weights   06/12/18 0855 06/12/18 1527 06/13/18 0500  Weight: 76 kg 72.4 kg 71.9 kg    Telemetry    NSR, HR in 70's to 90's. Occasional PVC's. No significant arrhythmias. - Personally Reviewed  ECG   No new tracings.   Physical Exam   General: Well developed, African American male appearing in no acute distress. Head: Normocephalic, atraumatic.  Neck: Supple without bruits, JVD not elevated. Lungs:  Resp regular and unlabored, expiratory wheeze along upper lung  fields bilaterally. Heart: RRR, S1, S2, no S3, S4, or murmur; no rub. Abdomen: Soft, non-tender, non-distended with normoactive bowel sounds. No hepatomegaly. No rebound/guarding. No obvious abdominal masses. Extremities: No clubbing, cyanosis, or lower extremity edema. Distal pedal pulses are 2+ bilaterally. Neuro: Alert and oriented X 3. Moves all extremities spontaneously. Psych: Normal affect.  Labs    Chemistry Recent Labs  Lab 06/12/18 0917 06/13/18 0610  NA 138 138  K 4.1 4.0  CL 104 100  CO2 24 29  GLUCOSE 126* 103*  BUN 23 25*  CREATININE 1.22 1.38*  CALCIUM 8.9 8.7*  GFRNONAA 50* 43*  GFRAA 58* 50*  ANIONGAP 10 9     Hematology Recent Labs  Lab 06/12/18 0917  WBC 12.4*  RBC 4.61  HGB 13.3  HCT 43.8  MCV 95.0  MCH 28.9  MCHC 30.4  RDW 12.6  PLT 144*    Cardiac Enzymes Recent Labs  Lab 06/12/18 0917 06/13/18 0610  TROPONINI 0.10* 0.10*   No results for input(s): TROPIPOC in the last 168 hours.   BNP Recent Labs  Lab 06/12/18 0917  BNP 431.0*     DDimer No results for input(s): DDIMER in the last 168 hours.   Radiology    Dg Chest [facility]  Result Date: 06/12/2018 CLINICAL DATA:  Onset chest pain, cough and shortness of breath yesterday. EXAM: PORTABLE CHEST 1 VIEW COMPARISON:  PA and lateral chest 05/07/2018. FINDINGS: There is cardiomegaly and bilateral airspace disease. No pneumothorax or pleural effusion. Aortic atherosclerosis noted. IMPRESSION: Bilateral airspace disease most consistent with pulmonary edema in this patient with cardiomegaly. Electronically Signed   By: Inge Rise M.D.   On: 06/12/2018 09:46    Cardiac Studies   Echocardiogram: 06/12/2018 IMPRESSIONS   1. The left ventricle has normal systolic function with an ejection fraction of 60-65%. The cavity size was normal. There is moderately increased left ventricular wall thickness. Left ventricular diastolic Doppler parameters are consistent with impaired   relaxation.  2. The right ventricle has mildly reduced systolic function. The cavity was mildly enlarged. There is no increase in right ventricular wall thickness. Right ventricular systolic pressure is severely elevated with an estimated pressure of 60.2 mmHg.  3. The aortic valve is tricuspid. Aortic valve regurgitation is trivial by color flow Doppler.. Mild aortic annular calcification noted.  4. The mitral valve is normal in structure. There is mild mitral annular calcification present.  5. The tricuspid valve is normal in structure.  6. The aortic root is normal in size and structure.   Patient Profile     85 y.o. male w/ PMH of CAD (s/p BMS to LAD in 2014, cath in 10/2016 showing patent stent with 80% distal-LCx stenosis which was unfavorable for PCI and medical management was recommended), HTN, HLD, GERD, and recent right hip fracture with surgical repair in 04/2018 who presented to [facility] ED on 06/12/2018 for evaluation of chest pain and worsening dyspnea on exertion.   Assessment & Plan    1. Acute on Chronic Diastolic CHF Exacerbation - presented with worsening dyspnea and a productive cough. BNP elevated to 431 and CXR showed cardiomegaly and pulmonary edema. Was started on IV Lasix 40mg  BID and he is listed as being -460 mL since admission but also had multiple urine occurrences noted where the exact amount was not recorded. Weight has declined by 1 lb.  - Echo showed a preserved EF of 60-65% but RV pressures were elevated at 60 mm Hg. He appears euvolemic by examination, therefore will likely stop IV Lasix today and transition to PO Lasix 20mg  daily tomorrow. Would need close outpatient labs given variable renal function (creatinine 1.22 on admission --> 1.38 today). Will ask for nursing staff to check ambulatory oxygen saturations. He does have an expiratory wheeze on examination, therefore will order Xopenex nebulizer treatment this morning (may need PRN Inhaler at discharge but  will defer to admitting team).   2. CAD/ Elevated Troponin Values - he is s/p BMS to LAD in 2014 with cath in 10/2016 showing patent stent with 80% distal-LCx stenosis which was unfavorable for PCI and medical management was recommended.  - initial troponin minimally elevated at 0.10. Plan was to obtain cyclic enzymes but it does not appear these were ordered upon admission. Will add-on Troponin to AM labs at this time. Echo was obtained yesterday and showed a preserved EF of 60-65% with no regional WMA.  - continue ASA (on 325mg  daily in the post-op setting for 6 weeks per ortho recommendations), Lopressor, Imdur, and statin therapy. Given intermittent discomfort and known distal disease by cath in 2018, will titrate Imdur to 60mg  daily for additional anti-anginal benefit.   3. HTN - BP has been variable at 128/68 - 172/99 since admission. Remains on PTA  Imdur 30mg  daily and Lopressor 12.5mg  BID. Was previously on Hydralazine, HCTZ, and Valsartan prior to his admission in 04/2018 but these were discontinued at that time due to hypotension and variable renal function. Will titrate Imdur as outlined above.   4. HLD - followed by PCP as an outpatient. He has been continued on PTA Atorvastatin 80mg  daily.   5. Recent Right Hip Fracture - s/p placement on intramedullary nail on 05/07/2018. Working with [place] PT multiple times per week PTA.    For questions or updates, please contact [place] Please consult [email or web address] for contact info under Cardiology/STEMI.   Linus Galas , PA-C 10:51 AM 06/13/2018 Pager: [phone]   Attending note:  Patient seen and examined.  Case discussed with Ms. Ahmed Prima PA-C.  Mainly complains of intermittent cough, has had some wheezing this morning.  Cardiac enzymes remain in flat pattern not suggestive of ACS, troponin I 0.10.  With IV Lasix he has had diuresis although creatinine has bumped up from 1.22-1.38.  He was given a Xopenex  nebulizer treatment this morning.  Echocardiogram shows normal LVEF at 60 to 65% with grade 1 diastolic dysfunction.  He has mildly reduced RV contraction with severe pulmonary hypertension and PASP around 60 mmHg.  This is not a new finding.  Also has had chronic interstitial prominence by chest x-ray.  At this point we do not plan to pursue ischemic testing as the cardiac enzyme elevations are likely due to demand ischemia rather than true ACS.  We will continue with medical therapy and advance Imdur.  Agree with transitioning back to oral Lasix.  Consider advancing pulmonary regimen.  Satira Sark, M.D., F.A.C.C.

## 2018-06-13 NOTE — Care Management Note (Signed)
Case Management Note  Patient Details  Name: BESNIK FEBUS MRN: [id number] Date of Birth: 06-24-1921  Subjective/Objective:   Admitted with CHF. Pt has insurance and PCP. Pt uses church family or [employer] for transportation. Pt's grandson lives with him and works during the day. Pt has cane, walker and WC. Does not use any assistive device with ambulation. Pt on O2 acutely. Pt manages medications himself, has a scale and uses it. He and his grandson prepares meals.                 Action/Plan: DC home with resumption of [place] services. CM will cont to follow. May need home O2 eval.   Expected Discharge Date:      06/14/2018            Expected Discharge Plan:  [place]  In-House Referral:  NA  Discharge planning Services  CM Consult  Post Acute Care Choice:  Home Health, Resumption of Svcs/PTA Provider Choice offered to:  NA  DME Arranged:    DME Agency:     HH Arranged:  RN, PT [place] Agency:  [facility]  Status of Service:  In process, will continue to follow  If discussed at Long Length of Stay Meetings, dates discussed:    Additional Comments:  Sherald Barge, RN 06/13/2018, 1:58 PM

## 2018-06-13 NOTE — Progress Notes (Signed)
PROGRESS NOTE    Anthony Galvan  ZJQ:[id number] DOB: 06-20-1921 DOA: 06/12/2018 PCP: [name], MD    Brief Narrative:  85 year old male with a history of diastolic congestive heart failure, coronary artery disease and hypertension, presents to the emergency room with complaints of chest pain and shortness of breath.  He was found to be in decompensated CHF and admitted for IV diuresis.   Assessment & Plan:   Active Problems:   Hyperlipidemia with target low density lipoprotein (LDL) cholesterol less than 70 mg/dL   Essential hypertension   CKD (chronic kidney disease) stage 3, GFR 30-59 ml/min (HCC)   Coronary artery disease involving native heart without angina pectoris   Acute on chronic diastolic CHF (congestive heart failure) (HCC)   Acute respiratory failure with hypoxia (HCC)   1. Acute respiratory failure with hypoxia.  Related to CHF and volume overload.  He is diuresing with IV Lasix.  Still becomes hypoxic on room air.  Continue supplemental oxygen. 2. Cute on chronic diastolic congestive heart failure.  Echocardiogram on admission shows preserved ejection fraction.  Cardiology following.  He was treated with intravenous Lasix.  Plan to transition to oral Lasix.  Continue on Imdur and beta-blockers. 3. Possible interstitial lung disease.  Patient noted to have chronic interstitial changes on chest x-ray.  He is also noted to have elevated pulmonary pressures on echocardiogram.  Will check CT chest to rule out any chronic thromboembolic disease as well as to evaluate underlying lung parenchyma. 4. Chronic kidney disease stage III.  Creatinine is currently stable with diuresis.  Continue to monitor. 5. Coronary artery disease.  Mild elevation of troponin of 0.1 likely demand ischemia as opposed to true ACS.  No chest pain at present. 6. Hyperlipidemia.  Continue statin 7. Hypertension.  Continue on beta-blockers and Imdur.   DVT prophylaxis: Heparin Code Status:  Partial code Family Communication: No family present Disposition Plan: Discharge home once respiratory status has stabilized   Consultants:   Cardiology  Procedures:  Echo: 1. The left ventricle has normal systolic function with an ejection fraction of 60-65%. The cavity size was normal. There is moderately increased left ventricular wall thickness. Left ventricular diastolic Doppler parameters are consistent with impaired  relaxation.  2. The right ventricle has mildly reduced systolic function. The cavity was mildly enlarged. There is no increase in right ventricular wall thickness. Right ventricular systolic pressure is severely elevated with an estimated pressure of 60.2 mmHg.  3. The aortic valve is tricuspid. Aortic valve regurgitation is trivial by color flow Doppler.. Mild aortic annular calcification noted.  4. The mitral valve is normal in structure. There is mild mitral annular calcification present.  5. The tricuspid valve is normal in structure.   6. The aortic root is normal in size and structure.  Antimicrobials:       Subjective: Shortness of breath is better, although not back to baseline.  Still becomes hypoxic on room air.  He notes that he has been wheezing more lately.  Objective: Vitals:   06/13/18 0500 06/13/18 0515 06/13/18 1449 06/13/18 1626  BP:  128/68  106/69  Pulse:  72  81  Resp:  18  18  Temp:  98 F (36.7 C)  98.3 F (36.8 C)  TempSrc:  Oral  Oral  SpO2:  95% 98% 99%  Weight: 71.9 kg     Height:        Intake/Output Summary (Last 24 hours) at 06/13/2018 1629 Last data filed at 06/13/2018 1500  Gross per 24 hour  Intake 240 ml  Output 1225 ml  Net -985 ml   Filed Weights   06/12/18 0855 06/12/18 1527 06/13/18 0500  Weight: 76 kg 72.4 kg 71.9 kg    Examination:  General exam: Appears calm and comfortable  Respiratory system: Fine crackles bilaterally. Respiratory effort normal. Cardiovascular system: S1 & S2 heard, RRR. No JVD,  murmurs, rubs, gallops or clicks. No pedal edema. Gastrointestinal system: Abdomen is nondistended, soft and nontender. No organomegaly or masses felt. Normal bowel sounds heard. Central nervous system: Alert and oriented. No focal neurological deficits. Extremities: Symmetric 5 x 5 power. Skin: No rashes, lesions or ulcers Psychiatry: Judgement and insight appear normal. Mood & affect appropriate.     Data Reviewed: I have personally reviewed following labs and imaging studies  CBC: Recent Labs  Lab 06/12/18 0917  WBC 12.4*  NEUTROABS 9.4*  HGB 13.3  HCT 43.8  MCV 95.0  PLT 478*   Basic Metabolic Panel: Recent Labs  Lab 06/12/18 0917 06/13/18 0610  NA 138 138  K 4.1 4.0  CL 104 100  CO2 24 29  GLUCOSE 126* 103*  BUN 23 25*  CREATININE 1.22 1.38*  CALCIUM 8.9 8.7*   GFR: Estimated Creatinine Clearance: 29.8 mL/min (A) (by C-G formula based on SCr of 1.38 mg/dL (H)). Liver Function Tests: No results for input(s): AST, ALT, ALKPHOS, BILITOT, PROT, ALBUMIN in the last 168 hours. No results for input(s): LIPASE, AMYLASE in the last 168 hours. No results for input(s): AMMONIA in the last 168 hours. Coagulation Profile: Recent Labs  Lab 06/12/18 0917  INR 1.0   Cardiac Enzymes: Recent Labs  Lab 06/12/18 0917 06/13/18 0610  TROPONINI 0.10* 0.10*   BNP (last 3 results) No results for input(s): PROBNP in the last 8760 hours. HbA1C: No results for input(s): HGBA1C in the last 72 hours. CBG: Recent Labs  Lab 06/13/18 1624  GLUCAP 101*   Lipid Profile: No results for input(s): CHOL, HDL, LDLCALC, TRIG, CHOLHDL, LDLDIRECT in the last 72 hours. Thyroid Function Tests: No results for input(s): TSH, T4TOTAL, FREET4, T3FREE, THYROIDAB in the last 72 hours. Anemia Panel: No results for input(s): VITAMINB12, FOLATE, FERRITIN, TIBC, IRON, RETICCTPCT in the last 72 hours. Sepsis Labs: No results for input(s): PROCALCITON, LATICACIDVEN in the last 168 hours.  No  results found for this or any previous visit (from the past 240 hour(s)).       Radiology Studies: Dg Chest Port 1 View  Result Date: 06/12/2018 CLINICAL DATA:  Onset chest pain, cough and shortness of breath yesterday. EXAM: PORTABLE CHEST 1 VIEW COMPARISON:  PA and lateral chest 05/07/2018. FINDINGS: There is cardiomegaly and bilateral airspace disease. No pneumothorax or pleural effusion. Aortic atherosclerosis noted. IMPRESSION: Bilateral airspace disease most consistent with pulmonary edema in this patient with cardiomegaly. Electronically Signed   By: Inge Rise M.D.   On: 06/12/2018 09:46        Scheduled Meds: . aspirin  325 mg Oral Daily  . atorvastatin  80 mg Oral q1800  . furosemide  40 mg Intravenous BID  . heparin  5,000 Units Subcutaneous Q8H  . [START ON 06/14/2018] isosorbide mononitrate  60 mg Oral Daily  . metoprolol tartrate  12.5 mg Oral BID  . polyethylene glycol  17 g Oral BID  . sodium chloride flush  3 mL Intravenous Q12H   Continuous Infusions: . sodium chloride       LOS: 1 day    Time spent: 60mins  Kathie Dike, MD [facility]   If 7PM-7AM, please contact night-coverage [email or web address]  06/13/2018, 4:29 PM

## 2018-06-14 DIAGNOSIS — J849 Interstitial pulmonary disease, unspecified: Secondary | ICD-10-CM

## 2018-06-14 DIAGNOSIS — J9601 Acute respiratory failure with hypoxia: Secondary | ICD-10-CM

## 2018-06-14 DIAGNOSIS — I2699 Other pulmonary embolism without acute cor pulmonale: Secondary | ICD-10-CM | POA: Diagnosis present

## 2018-06-14 DIAGNOSIS — N183 Chronic kidney disease, stage 3 (moderate): Secondary | ICD-10-CM

## 2018-06-14 LAB — URINE CULTURE

## 2018-06-14 LAB — CBC
HEMATOCRIT: 42.8 % (ref 39.0–52.0)
Hemoglobin: 12.6 g/dL — ABNORMAL LOW (ref 13.0–17.0)
MCH: 27.5 pg (ref 26.0–34.0)
MCHC: 29.4 g/dL — ABNORMAL LOW (ref 30.0–36.0)
MCV: 93.4 fL (ref 80.0–100.0)
Platelets: 143 10*3/uL — ABNORMAL LOW (ref 150–400)
RBC: 4.58 MIL/uL (ref 4.22–5.81)
RDW: 12.5 % (ref 11.5–15.5)
WBC: 11.6 10*3/uL — ABNORMAL HIGH (ref 4.0–10.5)
nRBC: 0 % (ref 0.0–0.2)

## 2018-06-14 LAB — HEPARIN LEVEL (UNFRACTIONATED): Heparin Unfractionated: 0.76 IU/mL — ABNORMAL HIGH (ref 0.30–0.70)

## 2018-06-14 LAB — BASIC METABOLIC PANEL
Anion gap: 11 (ref 5–15)
BUN: 32 mg/dL — ABNORMAL HIGH (ref 8–23)
CO2: 29 mmol/L (ref 22–32)
CREATININE: 1.3 mg/dL — AB (ref 0.61–1.24)
Calcium: 8.7 mg/dL — ABNORMAL LOW (ref 8.9–10.3)
Chloride: 95 mmol/L — ABNORMAL LOW (ref 98–111)
GFR calc Af Amer: 54 mL/min — ABNORMAL LOW (ref >60)
GFR calc non Af Amer: 47 mL/min — ABNORMAL LOW (ref >60)
GLUCOSE: 92 mg/dL (ref 70–99)
Potassium: 3.9 mmol/L (ref 3.5–5.1)
Sodium: 135 mmol/L (ref 135–145)

## 2018-06-14 LAB — APTT: aPTT: 193 seconds (ref 24–36)

## 2018-06-14 MED ORDER — FUROSEMIDE 20 MG PO TABS
20.0000 mg | ORAL_TABLET | Freq: Every day | ORAL | Status: DC
  Administered 2018-06-14 – 2018-06-15 (×2): 20 mg via ORAL
  Filled 2018-06-14 (×2): qty 1

## 2018-06-14 MED ORDER — APIXABAN 5 MG PO TABS
10.0000 mg | ORAL_TABLET | Freq: Two times a day (BID) | ORAL | Status: DC
  Administered 2018-06-14 – 2018-06-15 (×3): 10 mg via ORAL
  Filled 2018-06-14 (×4): qty 2

## 2018-06-14 MED ORDER — HEPARIN BOLUS VIA INFUSION
2000.0000 [IU] | Freq: Once | INTRAVENOUS | Status: AC
  Administered 2018-06-14: 2000 [IU] via INTRAVENOUS
  Filled 2018-06-14: qty 2000

## 2018-06-14 MED ORDER — FUROSEMIDE 10 MG/ML IJ SOLN
40.0000 mg | Freq: Once | INTRAMUSCULAR | Status: AC
  Administered 2018-06-14: 40 mg via INTRAVENOUS
  Filled 2018-06-14: qty 4

## 2018-06-14 MED ORDER — HEPARIN (PORCINE) 25000 UT/250ML-% IV SOLN
900.0000 [IU]/h | INTRAVENOUS | Status: DC
  Administered 2018-06-14: 1100 [IU]/h via INTRAVENOUS
  Filled 2018-06-14: qty 250

## 2018-06-14 MED ORDER — APIXABAN 5 MG PO TABS: 5.0000 mg | ORAL_TABLET | Freq: Two times a day (BID) | ORAL | Status: DC

## 2018-06-14 MED ORDER — GUAIFENESIN ER 600 MG PO TB12
1200.0000 mg | ORAL_TABLET | Freq: Two times a day (BID) | ORAL | Status: DC
  Administered 2018-06-14 – 2018-06-15 (×2): 1200 mg via ORAL
  Filled 2018-06-14 (×2): qty 2

## 2018-06-14 NOTE — Progress Notes (Signed)
PROGRESS NOTE    Anthony Galvan  [id number]:[id number] DOB: [birth year] DOA: 06/12/2018 PCP: [name], MD    Brief Narrative:  85 year old male with a history of diastolic congestive heart failure, coronary artery disease and hypertension, presents to the emergency room with complaints of chest pain and shortness of breath.  He was found to be in decompensated CHF and admitted for IV diuresis.   Assessment & Plan:   Active Problems:   Hyperlipidemia with target low density lipoprotein (LDL) cholesterol less than 70 mg/dL   Essential hypertension   CKD (chronic kidney disease) stage 3, GFR 30-59 ml/min (HCC)   Coronary artery disease involving native heart without angina pectoris   Acute on chronic diastolic CHF (congestive heart failure) (HCC)   Acute respiratory failure with hypoxia (HCC)   Acute pulmonary embolus ([place])   1. Acute respiratory failure with hypoxia.  Multifactorial related to CHF as well as interstitial lung disease and acute pulmonary embolus.  Will likely need to be discharged home on supplemental oxygen 2. Acute on chronic diastolic congestive heart failure.  Echocardiogram on admission shows preserved ejection fraction.  Cardiology following.  He was treated with intravenous Lasix.  Plan to transition to oral Lasix.  Continue on Imdur and beta-blockers. 3. Interstitial lung disease with bronchiectasis.  CT chest confirms interstitial lung disease with bronchiectasis.  He will need further follow-up with pulmonology and would likely benefit from high-resolution CT scan.  Likely has some degree of pulmonary fibrosis.  We will continue with bronchodilators and pulmonary hygiene.  Will likely need to discharge home with supplemental oxygen. 4. Acute pulmonary embolus.  Noted on CT chest.  Started on anticoagulation with apixaban.  No evidence of right heart strain. 5. Chronic kidney disease stage III.  Creatinine is currently stable with diuresis.  Continue to  monitor. 6. Coronary artery disease.  Mild elevation of troponin of 0.1 likely demand ischemia as opposed to true ACS.  No chest pain at present. 7. Hyperlipidemia.  Continue statin 8. Hypertension.  Continue on beta-blockers and Imdur.   DVT prophylaxis: Apixaban Code Status: Partial code Family Communication: No family present Disposition Plan: Discharge home once respiratory status has stabilized.  Will need home oxygen on discharge   Consultants:   Cardiology  Procedures:  Echo: 1. The left ventricle has normal systolic function with an ejection fraction of 60-65%. The cavity size was normal. There is moderately increased left ventricular wall thickness. Left ventricular diastolic Doppler parameters are consistent with impaired  relaxation.  2. The right ventricle has mildly reduced systolic function. The cavity was mildly enlarged. There is no increase in right ventricular wall thickness. Right ventricular systolic pressure is severely elevated with an estimated pressure of 60.2 mmHg.  3. The aortic valve is tricuspid. Aortic valve regurgitation is trivial by color flow Doppler.. Mild aortic annular calcification noted.  4. The mitral valve is normal in structure. There is mild mitral annular calcification present.  5. The tricuspid valve is normal in structure.   6. The aortic root is normal in size and structure.  Antimicrobials:       Subjective: He is feeling better today.  Still short has mild shortness of breath on exertion.  No chest pain.  Objective: Vitals:   06/13/18 2223 06/14/18 0614 06/14/18 0658 06/14/18 1526  BP: 138/67 136/70  (!) 120/98  Pulse: 78 70  72  Resp:    18  Temp: 98.1 F (36.7 C) 97.9 F (36.6 C)  98 F (36.7 C)  TempSrc: Oral Oral  Oral  SpO2: 99% 100%  (!) 89%  Weight:   72 kg   Height:        Intake/Output Summary (Last 24 hours) at 06/14/2018 1933 Last data filed at 06/14/2018 1800 Gross per 24 hour  Intake 720 ml  Output 1300  ml  Net -580 ml   Filed Weights   06/12/18 1527 06/13/18 0500 06/14/18 0658  Weight: 72.4 kg 71.9 kg 72 kg    Examination:  General exam: Alert, awake, oriented x 3 Respiratory system: Fine crackles bilaterally.  Respiratory effort normal. Cardiovascular system:RRR. No murmurs, rubs, gallops. Gastrointestinal system: Abdomen is nondistended, soft and nontender. No organomegaly or masses felt. Normal bowel sounds heard. Central nervous system: Alert and oriented. No focal neurological deficits. Extremities: No C/C/E, +pedal pulses Skin: No rashes, lesions or ulcers Psychiatry: Judgement and insight appear normal. Mood & affect appropriate.       Data Reviewed: I have personally reviewed following labs and imaging studies  CBC: Recent Labs  Lab 06/12/18 0917 06/14/18 1121  WBC 12.4* 11.6*  NEUTROABS 9.4*  --   HGB 13.3 12.6*  HCT 43.8 42.8  MCV 95.0 93.4  PLT 144* 779*   Basic Metabolic Panel: Recent Labs  Lab 06/12/18 0917 06/13/18 0610 06/14/18 0625  NA 138 138 135  K 4.1 4.0 3.9  CL 104 100 95*  CO2 24 29 29   GLUCOSE 126* 103* 92  BUN 23 25* 32*  CREATININE 1.22 1.38* 1.30*  CALCIUM 8.9 8.7* 8.7*   GFR: Estimated Creatinine Clearance: 31.6 mL/min (A) (by C-G formula based on SCr of 1.3 mg/dL (H)). Liver Function Tests: No results for input(s): AST, ALT, ALKPHOS, BILITOT, PROT, ALBUMIN in the last 168 hours. No results for input(s): LIPASE, AMYLASE in the last 168 hours. No results for input(s): AMMONIA in the last 168 hours. Coagulation Profile: Recent Labs  Lab 06/12/18 0917  INR 1.0   Cardiac Enzymes: Recent Labs  Lab 06/12/18 0917 06/13/18 0610  TROPONINI 0.10* 0.10*   BNP (last 3 results) No results for input(s): PROBNP in the last 8760 hours. HbA1C: No results for input(s): HGBA1C in the last 72 hours. CBG: Recent Labs  Lab 06/13/18 1624  GLUCAP 101*   Lipid Profile: No results for input(s): CHOL, HDL, LDLCALC, TRIG, CHOLHDL,  LDLDIRECT in the last 72 hours. Thyroid Function Tests: No results for input(s): TSH, T4TOTAL, FREET4, T3FREE, THYROIDAB in the last 72 hours. Anemia Panel: No results for input(s): VITAMINB12, FOLATE, FERRITIN, TIBC, IRON, RETICCTPCT in the last 72 hours. Sepsis Labs: No results for input(s): PROCALCITON, LATICACIDVEN in the last 168 hours.  Recent Results (from the past 240 hour(s))  Urine culture     Status: Abnormal   Collection Time: 06/12/18 11:42 AM  Result Value Ref Range Status   Specimen Description   Final    URINE, CLEAN CATCH Performed at [facility], [address] [place], [place] [address]    Special Requests   Final    NONE Performed at [facility], [address] [place], [place] [address]    Culture MULTIPLE SPECIES PRESENT, SUGGEST RECOLLECTION (A)  Final   Report Status 06/14/2018 FINAL  Final         Radiology Studies: Ct Angio Chest Pe W Or Wo Contrast  Result Date: 06/13/2018 CLINICAL DATA:  85 y/o M; chest pain, shortness of breath, congestive heart failure. PE suspected, low pretest probability. EXAM: CT ANGIOGRAPHY CHEST WITH CONTRAST TECHNIQUE: Multidetector CT imaging of  the chest was performed using the standard protocol during bolus administration of intravenous contrast. Multiplanar CT image reconstructions and MIPs were obtained to evaluate the vascular anatomy. CONTRAST:  181mL ISOVUE-370 IOPAMIDOL (ISOVUE-370) INJECTION 76% COMPARISON:  06/12/2018 chest radiograph FINDINGS: Cardiovascular: Satisfactory opacification of the pulmonary arteries to the segmental level. Acute segmental pulmonary embolus in the right upper lobe (series 6 image 88). Normal caliber thoracic aorta. Mildly enlarged main pulmonary artery. Cardiomegaly. No pericardial effusion. Mild aortic and severe coronary artery calcific atherosclerosis. Mediastinum/Nodes: No enlarged mediastinal, hilar, or axillary lymph nodes. Thyroid gland, trachea, and esophagus demonstrate no  significant findings. Lungs/Pleura: Peripheral and basilar predominant fibrosis and bronchiectasis. Calcified granulomata. No consolidation, effusion, or pneumothorax. Upper Abdomen: Left kidney upper pole cyst measuring 21 mm. Musculoskeletal: No chest wall abnormality. No acute or significant osseous findings. Review of the MIP images confirms the above findings. IMPRESSION: 1. Acute segmental pulmonary embolus in the right upper lobe. 2. Peripheral and basilar predominant pulmonary fibrosis and bronchiectasis. If not already assessed, interstitial lung disease protocol CT of chest is recommended on a nonemergent basis. 3. Enlarged main pulmonary artery and right heart probably related to congestive heart failure and pulmonary hypertension, less likely acute right heart strain in the setting of embolus. 4. Mild aortic and severe coronary artery calcific atherosclerosis. These results will be called to the ordering clinician or representative by the Radiologist Assistant, and communication documented in the PACS or zVision Dashboard. Electronically Signed   By: Kristine Garbe M.D.   On: 06/13/2018 18:38        Scheduled Meds: . apixaban  10 mg Oral BID  . [START ON 06/21/2018] apixaban  5 mg Oral BID  . aspirin  325 mg Oral Daily  . atorvastatin  80 mg Oral q1800  . isosorbide mononitrate  60 mg Oral Daily  . metoprolol tartrate  12.5 mg Oral BID  . polyethylene glycol  17 g Oral BID  . sodium chloride flush  3 mL Intravenous Q12H   Continuous Infusions: . sodium chloride       LOS: 2 days    Time spent: 49mins    Kathie Dike, MD [facility]   If 7PM-7AM, please contact night-coverage [email or web address]  06/14/2018, 7:33 PM

## 2018-06-14 NOTE — Care Management Important Message (Signed)
Important Message  Patient Details  Name: Anthony Galvan MRN: [id number] Date of Birth: 1921/10/05   Medicare Important Message Given:  Yes    [name] 06/14/2018, 4:01 PM

## 2018-06-14 NOTE — Progress Notes (Signed)
ANTICOAGULATION CONSULT NOTE - Initial Consult  Pharmacy Consult for heparin-->> transition to oral apixaban Indication: pulmonary embolus  Allergies  Allergen Reactions  . Lisinopril Other (See Comments)    "Allergic," per the [facility]- patient is unaware of any reaction (??)    Patient Measurements: Height: 5\' 4"  (162.6 cm) Weight: 158 lb 11.7 oz (72 kg) IBW/kg (Calculated) : 59.2 Heparin Dosing Weight: HEPARIN DW (KG): 72.4  Vital Signs: Temp: 97.9 F (36.6 C) (02/28 0614) Temp Source: Oral (02/28 0614) BP: 136/70 (02/28 0614) Pulse Rate: 70 (02/28 0614)  Labs: Recent Labs    06/12/18 0917 06/13/18 0610 06/14/18 0625 06/14/18 1121  HGB 13.3  --   --  12.6*  HCT 43.8  --   --  42.8  PLT 144*  --   --  143*  APTT  --   --   --  193*  LABPROT 13.3  --   --   --   INR 1.0  --   --   --   HEPARINUNFRC  --   --   --  0.76*  CREATININE 1.22 1.38* 1.30*  --   TROPONINI 0.10* 0.10*  --   --     Estimated Creatinine Clearance: 31.6 mL/min (A) (by C-G formula based on SCr of 1.3 mg/dL (H)).   Medical History: Past Medical History:  Diagnosis Date  . CAD S/P percutaneous coronary angioplasty 08/01/2012   PCI of mid LAD with a VeriFlex Bare Metal Stent 3.0 mm x 12 mm - post-dilated to 3.5 mm.;  Catheterization in July 2018 showed with severe heavily calcified distal circumflex-left PDA stenosis.  Not favorable for PCI.  . Duodenal ulcer May 2011   on EGD  . GERD (gastroesophageal reflux disease)   . Helicobacter pylori gastritis MAY 2012 HP STOOL AG NEG  . HTN (hypertension)   . Hyperlipidemia   . MI (myocardial infarction) ([place]) 1976, 1980, [place] for at [facility] and [place] (Dr. Iona Beard)  . Osteoarthritis   . S/P colonoscopy 2011   normal, internal hemorrhoids     Assessment:  Pharmacy consulted to dose heparin  for this 85 yo male  with  PE. He was on sub-q heparin for VTE prophylaxis.  Will convert patient to oral apixaban dosing for  PE.  Although patient has renal dysfunction, for acute PE, no renal dosage adjustment is necessary.  Goal of Therapy:  Heparin level 0.3-0.7 units/ml Monitor platelets by anticoagulation protocol: Yes   Plan:  Discontinue heparin infusion Apixaban 10mg  bid x7 days, then apixaban 5mg  bid therafter Continue to monitor H&H and platelets  Despina Pole 06/14/2018,2:29 PM

## 2018-06-14 NOTE — Progress Notes (Signed)
CRITICAL VALUE ALERT  Critical Value:  PTT 193  Date & Time Notied:  06/14/18 1240  Provider Notified: Dr. Roderic Palau  Orders Received/Actions taken: Text paged Dr. Roderic Palau to notify of result.

## 2018-06-14 NOTE — Progress Notes (Signed)
ANTICOAGULATION CONSULT NOTE - Initial Consult  Pharmacy Consult for heparin dosing Indication: pulmonary embolus  Allergies  Allergen Reactions  . Lisinopril Other (See Comments)    "Allergic," per the [facility]- patient is unaware of any reaction (??)    Patient Measurements: Height: 5\' 4"  (162.6 cm) Weight: 158 lb 11.7 oz (72 kg) IBW/kg (Calculated) : 59.2 Heparin Dosing Weight: HEPARIN DW (KG): 72.4  Vital Signs: Temp: 97.9 F (36.6 C) (02/28 0614) Temp Source: Oral (02/28 0614) BP: 136/70 (02/28 0614) Pulse Rate: 70 (02/28 0614)  Labs: Recent Labs    06/12/18 0917 06/13/18 0610 06/14/18 0625  HGB 13.3  --   --   HCT 43.8  --   --   PLT 144*  --   --   LABPROT 13.3  --   --   INR 1.0  --   --   CREATININE 1.22 1.38* 1.30*  TROPONINI 0.10* 0.10*  --     Estimated Creatinine Clearance: 31.6 mL/min (A) (by C-G formula based on SCr of 1.3 mg/dL (H)).   Medical History: Past Medical History:  Diagnosis Date  . CAD S/P percutaneous coronary angioplasty 08/01/2012   PCI of mid LAD with a VeriFlex Bare Metal Stent 3.0 mm x 12 mm - post-dilated to 3.5 mm.;  Catheterization in July 2018 showed with severe heavily calcified distal circumflex-left PDA stenosis.  Not favorable for PCI.  . Duodenal ulcer May 2011   on EGD  . GERD (gastroesophageal reflux disease)   . Helicobacter pylori gastritis MAY 2012 HP STOOL AG NEG  . HTN (hypertension)   . Hyperlipidemia   . MI (myocardial infarction) ([place]) 1976, 1980, [place] for at [facility] and [place] (Dr. Iona Beard)  . Osteoarthritis   . S/P colonoscopy 2011   normal, internal hemorrhoids     Assessment:  Pharmacy consulted to dose heparin  for this 85 yo male  with  PE. He was on sub-q heparin for VTE prophylaxis.  Goal of Therapy:  Heparin level 0.3-0.7 units/ml Monitor platelets by anticoagulation protocol: Yes   Plan:  Give 2000 unit heparin bolus x1 Start heparin infusion at  1100 units/hr Check  anti-Xa level in 6-8 hours and daily while on heparin Continue to monitor H&H and platelets  Despina Pole 06/14/2018,10:45 AM

## 2018-06-14 NOTE — Progress Notes (Signed)
Progress Note  Patient Name: Anthony Galvan Date of Encounter: 06/14/2018  Primary Cardiologist: Dr. Glenetta Hew  Subjective   No breathlessness at rest, but still has shortness of breath with activity.  No active chest pain.  No abdominal pain.  Inpatient Medications    Scheduled Meds: . aspirin  325 mg Oral Daily  . atorvastatin  80 mg Oral q1800  . furosemide  40 mg Intravenous BID  . isosorbide mononitrate  60 mg Oral Daily  . metoprolol tartrate  12.5 mg Oral BID  . polyethylene glycol  17 g Oral BID  . sodium chloride flush  3 mL Intravenous Q12H   Continuous Infusions: . sodium chloride    . heparin 1,100 Units/hr (06/14/18 1111)   PRN Meds: sodium chloride, acetaminophen, iohexol, levalbuterol, nitroGLYCERIN, ondansetron (ZOFRAN) IV, sodium chloride flush   Vital Signs    Vitals:   06/13/18 1626 06/13/18 2223 06/14/18 0614 06/14/18 0658  BP: 106/69 138/67 136/70   Pulse: 81 78 70   Resp: 18     Temp: 98.3 F (36.8 C) 98.1 F (36.7 C) 97.9 F (36.6 C)   TempSrc: Oral Oral Oral   SpO2: 99% 99% 100%   Weight:    72 kg  Height:        Intake/Output Summary (Last 24 hours) at 06/14/2018 1154 Last data filed at 06/14/2018 0900 Gross per 24 hour  Intake 240 ml  Output 1725 ml  Net -1485 ml   Filed Weights   06/12/18 1527 06/13/18 0500 06/14/18 0658  Weight: 72.4 kg 71.9 kg 72 kg    Telemetry    Sinus rhythm.  Personally reviewed.  ECG    Tracing from 06/12/2018 showed sinus tachycardia with left anterior fascicular block, increased voltage and nonspecific ST segment changes.  Personally reviewed.  Physical Exam   GEN:  Elderly male.  No acute distress.   Neck: No JVD. Cardiac: RRR, no gallop.  Respiratory:  Coarse breath sounds with slight expiratory wheeze. GI: Soft, nontender, bowel sounds present. MS: No edema; No deformity. Neuro:  Nonfocal. Psych: Alert and oriented x 3. Normal affect.  Labs    Chemistry Recent Labs  Lab  06/12/18 [phone] 06/13/18 0610 06/14/18 0625  NA 138 138 135  K 4.1 4.0 3.9  CL 104 100 95*  CO2 24 29 29   GLUCOSE 126* 103* 92  BUN 23 25* 32*  CREATININE 1.22 1.38* 1.30*  CALCIUM 8.9 8.7* 8.7*  GFRNONAA 50* 43* 47*  GFRAA 58* 50* 54*  ANIONGAP 10 9 11      Hematology Recent Labs  Lab 06/12/18 0917  WBC 12.4*  RBC 4.61  HGB 13.3  HCT 43.8  MCV 95.0  MCH 28.9  MCHC 30.4  RDW 12.6  PLT 144*    Cardiac Enzymes Recent Labs  Lab 06/12/18 0917 06/13/18 0610  TROPONINI 0.10* 0.10*   No results for input(s): TROPIPOC in the last 168 hours.   BNP Recent Labs  Lab 06/12/18 0917  BNP 431.0*     Radiology    Ct Angio Chest Pe W Or Wo Contrast  Result Date: 06/13/2018 CLINICAL DATA:  85 y/o M; chest pain, shortness of breath, congestive heart failure. PE suspected, low pretest probability. EXAM: CT ANGIOGRAPHY CHEST WITH CONTRAST TECHNIQUE: Multidetector CT imaging of the chest was performed using the standard protocol during bolus administration of intravenous contrast. Multiplanar CT image reconstructions and MIPs were obtained to evaluate the vascular anatomy. CONTRAST:  161mL ISOVUE-370 IOPAMIDOL (ISOVUE-370) INJECTION  76% COMPARISON:  06/12/2018 chest radiograph FINDINGS: Cardiovascular: Satisfactory opacification of the pulmonary arteries to the segmental level. Acute segmental pulmonary embolus in the right upper lobe (series 6 image 88). Normal caliber thoracic aorta. Mildly enlarged main pulmonary artery. Cardiomegaly. No pericardial effusion. Mild aortic and severe coronary artery calcific atherosclerosis. Mediastinum/Nodes: No enlarged mediastinal, hilar, or axillary lymph nodes. Thyroid gland, trachea, and esophagus demonstrate no significant findings. Lungs/Pleura: Peripheral and basilar predominant fibrosis and bronchiectasis. Calcified granulomata. No consolidation, effusion, or pneumothorax. Upper Abdomen: Left kidney upper pole cyst measuring 21 mm.  Musculoskeletal: No chest wall abnormality. No acute or significant osseous findings. Review of the MIP images confirms the above findings. IMPRESSION: 1. Acute segmental pulmonary embolus in the right upper lobe. 2. Peripheral and basilar predominant pulmonary fibrosis and bronchiectasis. If not already assessed, interstitial lung disease protocol CT of chest is recommended on a nonemergent basis. 3. Enlarged main pulmonary artery and right heart probably related to congestive heart failure and pulmonary hypertension, less likely acute right heart strain in the setting of embolus. 4. Mild aortic and severe coronary artery calcific atherosclerosis. These results will be called to the ordering clinician or representative by the Radiologist Assistant, and communication documented in the PACS or zVision Dashboard. Electronically Signed   By: Kristine Garbe M.D.   On: 06/13/2018 18:38    Cardiac Studies   Echocardiogram 06/12/2018:  1. The left ventricle has normal systolic function with an ejection fraction of 60-65%. The cavity size was normal. There is moderately increased left ventricular wall thickness. Left ventricular diastolic Doppler parameters are consistent with impaired  relaxation.  2. The right ventricle has mildly reduced systolic function. The cavity was mildly enlarged. There is no increase in right ventricular wall thickness. Right ventricular systolic pressure is severely elevated with an estimated pressure of 60.2 mmHg.  3. The aortic valve is tricuspid. Aortic valve regurgitation is trivial by color flow Doppler.. Mild aortic annular calcification noted.  4. The mitral valve is normal in structure. There is mild mitral annular calcification present.  5. The tricuspid valve is normal in structure.  6. The aortic root is normal in size and structure.  Patient Profile     85 y.o. male with a history of CAD (s/p BMS to LAD in 2014, cath in 10/2016 showing patent stent with 80%  distal-LCx stenosis which was unfavorable for PCI and medical management was recommended), HTN, HLD, GERD, and recent right hip fracture with surgical repair in 04/2018 who presented to [facility] ED on 06/12/2018 for evaluation of chest pain and worsening dyspnea on exertion.  Assessment & Plan    1.  Shortness of breath.  Initial concern was for a component of acute on chronic diastolic heart failure, although some features were more consistent with pulmonary etiology and he did have relative improvement with both gentle diuresis and nebulizer treatments.  Follow-up echocardiogram shows normal LVEF with right ventricular dysfunction and associated severe pulmonary hypertension with PASP 60 mmHg (old).  He underwent chest CTA per primary team which showed an acute segmental pulmonary embolus in the right upper lobe and baseline pulmonary fibrotic changes and bronchiectasis consistent with interstitial lung disease.  Coronary artery calcifications also incidentally noted.  2.  Known CAD with history of BMS to the LAD in 2014 and 80% distal left circumflex disease that was managed medically and unfavorable for PCI.  Mild troponin I elevation of 0.10 in flat pattern not consistent with ACS.  3.  Essential hypertension.  Outpatient  therapy already modified, currently on Imdur and Lopressor.  4.  Mixed hyperlipidemia, on statin therapy.  5.  Recent right hip fracture surgery with intramedullary nail on January 21.  I discussed the case with Dr. Roderic Palau who has initiated heparin in the setting of pulmonary embolus.  I suspect that the patient's degree of pulmonary hypertension is more likely related to interstitial lung disease and bronchiectasis however, particularly as this looks to be longer standing.  Would favor medical therapy for ischemic heart disease and will cut back on Lasix as well.  No further ischemic cardiac testing is planned.  Signed, Rozann Lesches, MD  06/14/2018, 11:54 AM

## 2018-06-15 DIAGNOSIS — I272 Pulmonary hypertension, unspecified: Secondary | ICD-10-CM

## 2018-06-15 DIAGNOSIS — J849 Interstitial pulmonary disease, unspecified: Secondary | ICD-10-CM | POA: Diagnosis present

## 2018-06-15 LAB — BASIC METABOLIC PANEL
Anion gap: 10 (ref 5–15)
BUN: 32 mg/dL — ABNORMAL HIGH (ref 8–23)
CO2: 33 mmol/L — ABNORMAL HIGH (ref 22–32)
CREATININE: 1.35 mg/dL — AB (ref 0.61–1.24)
Calcium: 8.7 mg/dL — ABNORMAL LOW (ref 8.9–10.3)
Chloride: 93 mmol/L — ABNORMAL LOW (ref 98–111)
GFR calc Af Amer: 52 mL/min — ABNORMAL LOW (ref >60)
GFR calc non Af Amer: 45 mL/min — ABNORMAL LOW (ref >60)
Glucose, Bld: 112 mg/dL — ABNORMAL HIGH (ref 70–99)
Potassium: 4.3 mmol/L (ref 3.5–5.1)
Sodium: 136 mmol/L (ref 135–145)

## 2018-06-15 LAB — CBC
HCT: 41.9 % (ref 39.0–52.0)
Hemoglobin: 12.1 g/dL — ABNORMAL LOW (ref 13.0–17.0)
MCH: 27.5 pg (ref 26.0–34.0)
MCHC: 28.9 g/dL — ABNORMAL LOW (ref 30.0–36.0)
MCV: 95.2 fL (ref 80.0–100.0)
Platelets: 131 10*3/uL — ABNORMAL LOW (ref 150–400)
RBC: 4.4 MIL/uL (ref 4.22–5.81)
RDW: 12.4 % (ref 11.5–15.5)
WBC: 9.6 10*3/uL (ref 4.0–10.5)
nRBC: 0 % (ref 0.0–0.2)

## 2018-06-15 MED ORDER — GUAIFENESIN ER 600 MG PO TB12: 600.0000 mg | ORAL_TABLET | Freq: Two times a day (BID) | ORAL | 0 refills | Status: DC

## 2018-06-15 MED ORDER — ALBUTEROL SULFATE (2.5 MG/3ML) 0.083% IN NEBU: 2.5000 mg | INHALATION_SOLUTION | Freq: Four times a day (QID) | RESPIRATORY_TRACT | 12 refills | Status: DC | PRN

## 2018-06-15 MED ORDER — ELIQUIS 5 MG VTE STARTER PACK: ORAL_TABLET | ORAL | 0 refills | Status: DC

## 2018-06-15 MED ORDER — ISOSORBIDE MONONITRATE ER 30 MG PO TB24: 60.0000 mg | ORAL_TABLET | Freq: Every day | ORAL | 0 refills | Status: DC

## 2018-06-15 MED ORDER — NEBULIZER MISC: 0 refills | Status: DC

## 2018-06-15 MED ORDER — FUROSEMIDE 20 MG PO TABS: 20.0000 mg | ORAL_TABLET | Freq: Every day | ORAL | 0 refills | Status: DC

## 2018-06-15 NOTE — Discharge Summary (Signed)
Physician Discharge Summary  Anthony Galvan:[id number] DOB: 1921/09/10 DOA: 06/12/2018  PCP: [name], MD  Admit date: 06/12/2018 Discharge date: 06/15/2018  Admitted From: Home Disposition: Home  Recommendations for Outpatient Follow-up:  1. Follow up with PCP in 1-2 weeks 2. Please obtain BMP/CBC in one week 3. Consider outpatient referral to pulmonology  Home Health: Resume home health PT, RN Equipment/Devices: Oxygen at 2 L/min  Discharge Condition: Stable CODE STATUS: Full code Diet recommendation: Heart healthy  Brief/Interim Summary: 85 year old male with history of diastolic heart failure, admitted to the hospital with chest pain and dyspnea.  He ruled out for ACS with negative cardiac markers.  He did have some element of decompensated CHF and was admitted for IV diuresis.  Discharge Diagnoses:  Active Problems:   Hyperlipidemia with target low density lipoprotein (LDL) cholesterol less than 70 mg/dL   Essential hypertension   CKD (chronic kidney disease) stage 3, GFR 30-59 ml/min (HCC)   Coronary artery disease involving native heart without angina pectoris   Acute on chronic diastolic CHF (congestive heart failure) (HCC)   Acute respiratory failure with hypoxia (HCC)   Acute pulmonary embolus (HCC)   Interstitial lung disease ([place])   Pulmonary hypertension ([place])  1. Acute respiratory failure with hypoxia.  Multifactorial, related to CHF as well as interstitial lung disease noted on CT scan.  He is also noted to have acute pulmonary embolus on CT.  Patient will need supplemental oxygen on discharge.  This is been arranged. 2. Acute on chronic diastolic heart failure.  Improved with IV Lasix.  He is been transitioned to oral Lasix.  Continue Imdur and beta-blockers. 3. Interstitial lung disease with bronchiectasis.  CT chest confirmed interstitial lung disease with bronchiectasis.  He will need further follow-up with pulmonology and may need a high-resolution  CT scan of his chest as an outpatient.  He likely has some degree of pulmonary fibrosis.  He will be continued on bronchodilators and pulmonary hygiene. 4. Acute pulmonary embolus.  Noted on CT chest.  He was found to have significant pulmonary hypertension on echocardiogram.  He has been started on anticoagulation.  No evidence of right heart strain. 5. Chronic kidney disease stage III.  Creatinine is currently stable with diuresis.  Continue to monitor. 6. Hyperlipidemia.  Continue statin  Discharge Instructions  Discharge Instructions    Diet - low sodium heart healthy   Complete by:  As directed    Increase activity slowly   Complete by:  As directed      Allergies as of 06/15/2018      Reactions   Lisinopril Other (See Comments)   "Allergic," per the [facility]- patient is unaware of any reaction (??)      Medication List    TAKE these medications   acetaminophen 325 MG tablet Commonly known as:  TYLENOL Take 2 tablets (650 mg total) by mouth 3 (three) times daily as needed (pain).   albuterol (2.5 MG/3ML) 0.083% nebulizer solution Commonly known as:  PROVENTIL Take 3 mLs (2.5 mg total) by nebulization every 6 (six) hours as needed for wheezing or shortness of breath.   aspirin 325 MG tablet Take 1 tablet (325 mg total) by mouth daily.   atorvastatin 80 MG tablet Commonly known as:  LIPITOR Take 1 tablet (80 mg total) by mouth daily at 6 PM.   calcium carbonate 1250 (500 Ca) MG tablet Commonly known as:  OS-CAL - dosed in mg of elemental calcium Take 1 tablet (500 mg of  elemental calcium total) by mouth daily with breakfast.   ELIQUIS DVT/PE STARTER PACK 5 MG Tabs Take as directed on package: start with two-5mg  tablets twice daily for 7 days. On day 8, switch to one-5mg  tablet twice daily.   furosemide 20 MG tablet Commonly known as:  LASIX Take 1 tablet (20 mg total) by mouth daily. Start taking on:  June 16, 2018   guaiFENesin 600 MG 12 hr tablet Commonly known as:   MUCINEX Take 1 tablet (600 mg total) by mouth 2 (two) times daily.   isosorbide mononitrate 30 MG 24 hr tablet Commonly known as:  IMDUR Take 2 tablets (60 mg total) by mouth daily. What changed:  how much to take   metoprolol tartrate 25 MG tablet Commonly known as:  LOPRESSOR Take 0.5 tablets (12.5 mg total) by mouth 2 (two) times daily.   multivitamin with minerals Tabs tablet Take 1 tablet by mouth daily.   Nebulizer Misc Please provide 1 nebulizer machine and all necessary supplies. Diagnosis: J84, Office number: [phone]   NITROSTAT 0.4 MG SL tablet Generic drug:  nitroGLYCERIN PLACE 1 TABLET UNDER TONGUE EVERY 5 MINUTES FOR 3 DOSES AS NEEDED.   polyethylene glycol packet Commonly known as:  MIRALAX / GLYCOLAX Take 17 g by mouth 2 (two) times daily.   Vitamin D (Ergocalciferol) 1.25 MG (50000 UT) Caps capsule Commonly known as:  DRISDOL Take 1 capsule (50,000 Units total) by mouth every 7 (seven) days.            Durable Medical Equipment  (From admission, onward)         Start     Ordered   06/15/18 1428  For home use only DME Nebulizer machine  Once    Question:  Patient needs a nebulizer to treat with the following condition  Answer:  Interstitial lung disease ([place])   06/15/18 1427   06/15/18 1137  For home use only DME oxygen  Once    Question Answer Comment  Mode or (Route) Nasal cannula   Liters per Minute 2   Frequency Continuous (stationary and portable oxygen unit needed)   Oxygen conserving device Yes   Oxygen delivery system Gas      06/15/18 1136          Allergies  Allergen Reactions  . Lisinopril Other (See Comments)    "Allergic," per the [facility]- patient is unaware of any reaction (??)    Consultations:  Cardiology   Procedures/Studies: Ct Angio Chest Pe W Or Wo Contrast  Result Date: 06/13/2018 CLINICAL DATA:  85 y/o M; chest pain, shortness of breath, congestive heart failure. PE suspected, low pretest probability. EXAM:  CT ANGIOGRAPHY CHEST WITH CONTRAST TECHNIQUE: Multidetector CT imaging of the chest was performed using the standard protocol during bolus administration of intravenous contrast. Multiplanar CT image reconstructions and MIPs were obtained to evaluate the vascular anatomy. CONTRAST:  134mL ISOVUE-370 IOPAMIDOL (ISOVUE-370) INJECTION 76% COMPARISON:  06/12/2018 chest radiograph FINDINGS: Cardiovascular: Satisfactory opacification of the pulmonary arteries to the segmental level. Acute segmental pulmonary embolus in the right upper lobe (series 6 image 88). Normal caliber thoracic aorta. Mildly enlarged main pulmonary artery. Cardiomegaly. No pericardial effusion. Mild aortic and severe coronary artery calcific atherosclerosis. Mediastinum/Nodes: No enlarged mediastinal, hilar, or axillary lymph nodes. Thyroid gland, trachea, and esophagus demonstrate no significant findings. Lungs/Pleura: Peripheral and basilar predominant fibrosis and bronchiectasis. Calcified granulomata. No consolidation, effusion, or pneumothorax. Upper Abdomen: Left kidney upper pole cyst measuring 21 mm. Musculoskeletal: No chest  wall abnormality. No acute or significant osseous findings. Review of the MIP images confirms the above findings. IMPRESSION: 1. Acute segmental pulmonary embolus in the right upper lobe. 2. Peripheral and basilar predominant pulmonary fibrosis and bronchiectasis. If not already assessed, interstitial lung disease protocol CT of chest is recommended on a nonemergent basis. 3. Enlarged main pulmonary artery and right heart probably related to congestive heart failure and pulmonary hypertension, less likely acute right heart strain in the setting of embolus. 4. Mild aortic and severe coronary artery calcific atherosclerosis. These results will be called to the ordering clinician or representative by the Radiologist Assistant, and communication documented in the PACS or zVision Dashboard. Electronically Signed   By: Kristine Garbe M.D.   On: 06/13/2018 18:38   Dg Chest Port 1 View  Result Date: 06/12/2018 CLINICAL DATA:  Onset chest pain, cough and shortness of breath yesterday. EXAM: PORTABLE CHEST 1 VIEW COMPARISON:  PA and lateral chest 05/07/2018. FINDINGS: There is cardiomegaly and bilateral airspace disease. No pneumothorax or pleural effusion. Aortic atherosclerosis noted. IMPRESSION: Bilateral airspace disease most consistent with pulmonary edema in this patient with cardiomegaly. Electronically Signed   By: Inge Rise M.D.   On: 06/12/2018 09:46      Subjective: Feeling better.  Able to ambulate while wearing oxygen.  Shortness of breath improving  Discharge Exam: Vitals:   06/14/18 2207 06/15/18 0500 06/15/18 0654 06/15/18 1502  BP: (!) 115/56  121/64 94/81  Pulse: 73  67 68  Resp: 17  18 18   Temp: 97.8 F (36.6 C)  97.7 F (36.5 C) 98.2 F (36.8 C)  TempSrc: Oral  Oral Oral  SpO2: 100%  100% 100%  Weight:  73.2 kg    Height:        General: Pt is alert, awake, not in acute distress Cardiovascular: RRR, S1/S2 +, no rubs, no gallops Respiratory: CTA bilaterally, no wheezing, no rhonchi Abdominal: Soft, NT, ND, bowel sounds + Extremities: no edema, no cyanosis    The results of significant diagnostics from this hospitalization (including imaging, microbiology, ancillary and laboratory) are listed below for reference.     Microbiology: Recent Results (from the past 240 hour(s))  Urine culture     Status: Abnormal   Collection Time: 06/12/18 11:42 AM  Result Value Ref Range Status   Specimen Description   Final    URINE, CLEAN CATCH Performed at [facility], [address], [place], [place] [address]    Special Requests   Final    NONE Performed at [facility], [address], [place], [place] [address]    Culture MULTIPLE SPECIES PRESENT, SUGGEST RECOLLECTION (A)  Final   Report Status 06/14/2018 FINAL  Final     Labs: BNP (last 3 results) Recent Labs     06/12/18 0917  BNP 443.1*   Basic Metabolic Panel: Recent Labs  Lab 06/12/18 0917 06/13/18 0610 06/14/18 0625 06/15/18 0615  NA 138 138 135 136  K 4.1 4.0 3.9 4.3  CL 104 100 95* 93*  CO2 24 29 29  33*  GLUCOSE 126* 103* 92 112*  BUN 23 25* 32* 32*  CREATININE 1.22 1.38* 1.30* 1.35*  CALCIUM 8.9 8.7* 8.7* 8.7*   Liver Function Tests: No results for input(s): AST, ALT, ALKPHOS, BILITOT, PROT, ALBUMIN in the last 168 hours. No results for input(s): LIPASE, AMYLASE in the last 168 hours. No results for input(s): AMMONIA in the last 168 hours. CBC: Recent Labs  Lab 06/12/18 0917 06/14/18 1121 06/15/18  0615  WBC 12.4* 11.6* 9.6  NEUTROABS 9.4*  --   --   HGB 13.3 12.6* 12.1*  HCT 43.8 42.8 41.9  MCV 95.0 93.4 95.2  PLT 144* 143* 131*   Cardiac Enzymes: Recent Labs  Lab 06/12/18 0917 06/13/18 0610  TROPONINI 0.10* 0.10*   BNP: Invalid input(s): POCBNP CBG: Recent Labs  Lab 06/13/18 1624  GLUCAP 101*   D-Dimer No results for input(s): DDIMER in the last 72 hours. Hgb A1c No results for input(s): HGBA1C in the last 72 hours. Lipid Profile No results for input(s): CHOL, HDL, LDLCALC, TRIG, CHOLHDL, LDLDIRECT in the last 72 hours. Thyroid function studies No results for input(s): TSH, T4TOTAL, T3FREE, THYROIDAB in the last 72 hours.  Invalid input(s): FREET3 Anemia work up No results for input(s): VITAMINB12, FOLATE, FERRITIN, TIBC, IRON, RETICCTPCT in the last 72 hours. Urinalysis    Component Value Date/Time   COLORURINE STRAW (A) 06/12/2018 1142   APPEARANCEUR CLEAR 06/12/2018 1142   APPEARANCEUR Clear 01/18/2017 1554   LABSPEC 1.006 06/12/2018 1142   PHURINE 6.0 06/12/2018 1142   GLUCOSEU NEGATIVE 06/12/2018 1142   HGBUR NEGATIVE 06/12/2018 1142   BILIRUBINUR NEGATIVE 06/12/2018 1142   BILIRUBINUR Negative 01/18/2017 1554   KETONESUR NEGATIVE 06/12/2018 1142   PROTEINUR NEGATIVE 06/12/2018 1142   UROBILINOGEN 1.0 08/27/2009 0830   NITRITE  NEGATIVE 06/12/2018 1142   LEUKOCYTESUR NEGATIVE 06/12/2018 1142   Sepsis Labs Invalid input(s): PROCALCITONIN,  WBC,  LACTICIDVEN Microbiology Recent Results (from the past 240 hour(s))  Urine culture     Status: Abnormal   Collection Time: 06/12/18 11:42 AM  Result Value Ref Range Status   Specimen Description   Final    URINE, CLEAN CATCH Performed at [facility], [address] [place], [place] [address]    Special Requests   Final    NONE Performed at [facility], [address] [place], [place] [address]    Culture MULTIPLE SPECIES PRESENT, SUGGEST RECOLLECTION (A)  Final   Report Status 06/14/2018 FINAL  Final     Time coordinating discharge: 35mins  SIGNED:   Kathie Dike, MD  [facility] 06/15/2018, 6:21 PM   If 7PM-7AM, please contact night-coverage [email or web address]

## 2018-06-15 NOTE — Care Management Note (Signed)
Case Management Note  Patient Details  Name: Anthony Galvan MRN: [id number] Date of Birth: 16-Sep-1921  Subjective/Objective:  Patient to be discharged per MD order. Orders in place for home health services. Previous RNCM had began workup for home health via [place] care. Referral confirmed with J[facility]. DME orders for Oxygen. Qualifying sats in place. [facility] to deliver O2 to [place] between 1700 and 1800.                   Action/Plan:   Expected Discharge Date:                  Expected Discharge Plan:  [place]  In-House Referral:  NA  Discharge planning Services  CM Consult  Post Acute Care Choice:  Home Health, Resumption of Svcs/PTA Provider Choice offered to:  NA  DME Arranged:  Oxygen DME Agency:  [place] Arranged:  RN, PT [facility] Agency:  [place]  Status of Service:  Completed, signed off  If discussed at [place] of Stay Meetings, dates discussed:    Additional Comments:  Latanya Maudlin, RN 06/15/2018, 3:20 PM

## 2018-06-15 NOTE — Progress Notes (Signed)
Patient left during shift change.  No assessment completed on patient.

## 2018-06-15 NOTE — Progress Notes (Signed)
SATURATION QUALIFICATIONS: (This note is used to comply with regulatory documentation for home oxygen)  Patient Saturations on Room Air at Rest = 86%  Patient Saturations on Room Air while Ambulating = 83%  Patient Saturations on 2 Liters of oxygen while Ambulating = 93%  Please briefly explain why patient needs home oxygen: PE this admission.

## 2018-06-15 NOTE — Progress Notes (Signed)
Nsg Discharge Note  Admit Date:  06/12/2018 Discharge date: 06/15/2018   Anthony Galvan to be D/C'd Home per MD order.  AVS completed.  Copy for chart, and copy for patient signed, and dated. Patient/caregiver able to verbalize understanding.  Discharge Medication: Allergies as of 06/15/2018      Reactions   Lisinopril Other (See Comments)   "Allergic," per the [facility]- patient is unaware of any reaction (??)      Medication List    TAKE these medications   acetaminophen 325 MG tablet Commonly known as:  TYLENOL Take 2 tablets (650 mg total) by mouth 3 (three) times daily as needed (pain).   albuterol (2.5 MG/3ML) 0.083% nebulizer solution Commonly known as:  PROVENTIL Take 3 mLs (2.5 mg total) by nebulization every 6 (six) hours as needed for wheezing or shortness of breath.   aspirin 325 MG tablet Take 1 tablet (325 mg total) by mouth daily.   atorvastatin 80 MG tablet Commonly known as:  LIPITOR Take 1 tablet (80 mg total) by mouth daily at 6 PM.   calcium carbonate 1250 (500 Ca) MG tablet Commonly known as:  OS-CAL - dosed in mg of elemental calcium Take 1 tablet (500 mg of elemental calcium total) by mouth daily with breakfast.   ELIQUIS DVT/PE STARTER PACK 5 MG Tabs Take as directed on package: start with two-5mg  tablets twice daily for 7 days. On day 8, switch to one-5mg  tablet twice daily.   furosemide 20 MG tablet Commonly known as:  LASIX Take 1 tablet (20 mg total) by mouth daily. Start taking on:  June 16, 2018   guaiFENesin 600 MG 12 hr tablet Commonly known as:  MUCINEX Take 1 tablet (600 mg total) by mouth 2 (two) times daily.   isosorbide mononitrate 30 MG 24 hr tablet Commonly known as:  IMDUR Take 2 tablets (60 mg total) by mouth daily. What changed:  how much to take   metoprolol tartrate 25 MG tablet Commonly known as:  LOPRESSOR Take 0.5 tablets (12.5 mg total) by mouth 2 (two) times daily.   multivitamin with minerals Tabs tablet Take 1  tablet by mouth daily.   Nebulizer Misc Please provide 1 nebulizer machine and all necessary supplies. Diagnosis: J84, Office number: [phone]   NITROSTAT 0.4 MG SL tablet Generic drug:  nitroGLYCERIN PLACE 1 TABLET UNDER TONGUE EVERY 5 MINUTES FOR 3 DOSES AS NEEDED.   polyethylene glycol packet Commonly known as:  MIRALAX / GLYCOLAX Take 17 g by mouth 2 (two) times daily.   Vitamin D (Ergocalciferol) 1.25 MG (50000 UT) Caps capsule Commonly known as:  DRISDOL Take 1 capsule (50,000 Units total) by mouth every 7 (seven) days.            Durable Medical Equipment  (From admission, onward)         Start     Ordered   06/15/18 1428  For home use only DME Nebulizer machine  Once    Question:  Patient needs a nebulizer to treat with the following condition  Answer:  Interstitial lung disease ([place])   06/15/18 1427   06/15/18 1137  For home use only DME oxygen  Once    Question Answer Comment  Mode or (Route) Nasal cannula   Liters per Minute 2   Frequency Continuous (stationary and portable oxygen unit needed)   Oxygen conserving device Yes   Oxygen delivery system Gas      06/15/18 1136  Discharge Assessment: Vitals:   06/15/18 0654 06/15/18 1502  BP: 121/64 94/81  Pulse: 67 68  Resp: 18 18  Temp: 97.7 F (36.5 C) 98.2 F (36.8 C)  SpO2: 100% 100%   Skin clean, dry and intact without evidence of skin break down, no evidence of skin tears noted. IV catheter discontinued intact. Site without signs and symptoms of complications - no redness or edema noted at insertion site, patient denies c/o pain - only slight tenderness at site.  Dressing with slight pressure applied.  D/c Instructions-Education: Discharge instructions given to patient/family with verbalized understanding. D/c education completed with patient/family including follow up instructions, medication list, d/c activities limitations if indicated, with other d/c instructions as indicated by  MD - patient able to verbalize understanding, all questions fully answered. Patient instructed to return to ED, call 911, or call MD for any changes in condition.  Patient escorted via [place], and D/C home via private auto.  Eda Keys, RN 06/15/2018 6:37 PM

## 2018-06-17 ENCOUNTER — Encounter (HOSPITAL_COMMUNITY): Payer: Self-pay | Admitting: Emergency Medicine

## 2018-06-17 ENCOUNTER — Emergency Department (HOSPITAL_COMMUNITY)
Admission: EM | Admit: 2018-06-17 | Discharge: 2018-06-17 | Disposition: A | Discharge status: Home / Self Care | Payer: Medicare Other | Attending: Emergency Medicine | Admitting: Emergency Medicine

## 2018-06-17 DIAGNOSIS — K649 Unspecified hemorrhoids: Secondary | ICD-10-CM | POA: Diagnosis not present

## 2018-06-17 DIAGNOSIS — Z79899 Other long term (current) drug therapy: Secondary | ICD-10-CM | POA: Insufficient documentation

## 2018-06-17 DIAGNOSIS — K625 Hemorrhage of anus and rectum: Secondary | ICD-10-CM | POA: Insufficient documentation

## 2018-06-17 DIAGNOSIS — K219 Gastro-esophageal reflux disease without esophagitis: Secondary | ICD-10-CM | POA: Diagnosis not present

## 2018-06-17 DIAGNOSIS — I129 Hypertensive chronic kidney disease with stage 1 through stage 4 chronic kidney disease, or unspecified chronic kidney disease: Secondary | ICD-10-CM | POA: Diagnosis not present

## 2018-06-17 DIAGNOSIS — N183 Chronic kidney disease, stage 3 (moderate): Secondary | ICD-10-CM | POA: Diagnosis not present

## 2018-06-17 DIAGNOSIS — I2510 Atherosclerotic heart disease of native coronary artery without angina pectoris: Secondary | ICD-10-CM | POA: Diagnosis not present

## 2018-06-17 DIAGNOSIS — I130 Hypertensive heart and chronic kidney disease with heart failure and stage 1 through stage 4 chronic kidney disease, or unspecified chronic kidney disease: Secondary | ICD-10-CM | POA: Insufficient documentation

## 2018-06-17 DIAGNOSIS — I5032 Chronic diastolic (congestive) heart failure: Secondary | ICD-10-CM | POA: Diagnosis not present

## 2018-06-17 DIAGNOSIS — Z9181 History of falling: Secondary | ICD-10-CM | POA: Diagnosis not present

## 2018-06-17 DIAGNOSIS — Z7901 Long term (current) use of anticoagulants: Secondary | ICD-10-CM | POA: Diagnosis not present

## 2018-06-17 DIAGNOSIS — E785 Hyperlipidemia, unspecified: Secondary | ICD-10-CM | POA: Diagnosis not present

## 2018-06-17 DIAGNOSIS — S72141K Displaced intertrochanteric fracture of right femur, subsequent encounter for closed fracture with nonunion: Secondary | ICD-10-CM | POA: Diagnosis not present

## 2018-06-17 DIAGNOSIS — E559 Vitamin D deficiency, unspecified: Secondary | ICD-10-CM | POA: Diagnosis not present

## 2018-06-17 DIAGNOSIS — Z955 Presence of coronary angioplasty implant and graft: Secondary | ICD-10-CM | POA: Diagnosis not present

## 2018-06-17 DIAGNOSIS — Z7982 Long term (current) use of aspirin: Secondary | ICD-10-CM | POA: Diagnosis not present

## 2018-06-17 DIAGNOSIS — I4581 Long QT syndrome: Secondary | ICD-10-CM | POA: Diagnosis not present

## 2018-06-17 LAB — BASIC METABOLIC PANEL
Anion gap: 11 (ref 5–15)
BUN: 32 mg/dL — ABNORMAL HIGH (ref 8–23)
CALCIUM: 9.2 mg/dL (ref 8.9–10.3)
CHLORIDE: 98 mmol/L (ref 98–111)
CO2: 27 mmol/L (ref 22–32)
Creatinine, Ser: 1.13 mg/dL (ref 0.61–1.24)
GFR calc Af Amer: >60 mL/min (ref >60)
GFR calc non Af Amer: 55 mL/min — ABNORMAL LOW (ref >60)
Glucose, Bld: 94 mg/dL (ref 70–99)
Potassium: 4.1 mmol/L (ref 3.5–5.1)
Sodium: 136 mmol/L (ref 135–145)

## 2018-06-17 LAB — CBC WITH DIFFERENTIAL/PLATELET
Abs Immature Granulocytes: 0.03 10*3/uL (ref 0.00–0.07)
Basophils Absolute: 0.1 10*3/uL (ref 0.0–0.1)
Basophils Relative: 1 %
Eosinophils Absolute: 0.1 10*3/uL (ref 0.0–0.5)
Eosinophils Relative: 1 %
HCT: 44.6 % (ref 39.0–52.0)
Hemoglobin: 13 g/dL (ref 13.0–17.0)
Immature Granulocytes: 0 %
LYMPHS ABS: 2.1 10*3/uL (ref 0.7–4.0)
Lymphocytes Relative: 20 %
MCH: 27.4 pg (ref 26.0–34.0)
MCHC: 29.1 g/dL — ABNORMAL LOW (ref 30.0–36.0)
MCV: 93.9 fL (ref 80.0–100.0)
Monocytes Absolute: 0.9 10*3/uL (ref 0.1–1.0)
Monocytes Relative: 9 %
NRBC: 0 % (ref 0.0–0.2)
Neutro Abs: 7.3 10*3/uL (ref 1.7–7.7)
Neutrophils Relative %: 69 %
Platelets: 165 10*3/uL (ref 150–400)
RBC: 4.75 MIL/uL (ref 4.22–5.81)
RDW: 12.3 % (ref 11.5–15.5)
WBC: 10.6 10*3/uL — ABNORMAL HIGH (ref 4.0–10.5)

## 2018-06-17 LAB — TROPONIN I: Troponin I: 0.05 ng/mL

## 2018-06-17 LAB — POC OCCULT BLOOD, ED: Fecal Occult Bld: NEGATIVE

## 2018-06-17 MED ORDER — FAMOTIDINE 20 MG PO TABS
20.0000 mg | ORAL_TABLET | Freq: Once | ORAL | Status: AC
  Administered 2018-06-17: 20 mg via ORAL
  Filled 2018-06-17: qty 1

## 2018-06-17 NOTE — ED Notes (Signed)
CRITICAL VALUE ALERT  Critical Value:  Troponin 0.05  Date & Time Notied:  06/17/2018  Provider Notified: Dr Reather Converse  Orders Received/Actions taken: see new orders.

## 2018-06-17 NOTE — ED Triage Notes (Signed)
Pt states he was just d/c from hospital a few days ago and this morning had blood in stool.  Was put on a blood thinner while in hospital.  States his home health nurse "freaked out" and told him he had to come.

## 2018-06-17 NOTE — ED Provider Notes (Signed)
[facility] EMERGENCY DEPARTMENT Provider Note   CSN: [id number] Arrival date & time: 06/17/18  4403    History   Chief Complaint Chief Complaint  Patient presents with  . Rectal Bleeding    HPI Anthony Galvan is a 85 y.o. male. W/ PMH of recent admission for PE, chronic diastolic heart failure, CKD3, HLD and hx of internal hemorrhoids presenting with BRBPR. He states he was in the hospital recently for chest pain, found to have acute pulmonary embolus and was discharged on Eliquis. He states he had a bowel movement with bright red blood this morning as well as noted blood when he wiped his nose. He states he was unable to pick up Eliquis from his pharmacy as he needs the banks to open so he can pay for it. He states he did not see the bright red blood himself but his son did and called his home nurse who advised him to go to the ED. He denies any nausea, vomiting, abdominal pain, diarrhea, significant dyspnea, fatigue or weakness. He states he began to experience some chest pressure on the way over which is similar to his usual angina. He states he usually takes nitro for it and it would go away. Currently continuing to experience chest pressure but much less in severity.    Past Medical History:  Diagnosis Date  . CAD S/P percutaneous coronary angioplasty 08/01/2012   PCI of mid LAD with a VeriFlex Bare Metal Stent 3.0 mm x 12 mm - post-dilated to 3.5 mm.;  Catheterization in July 2018 showed with severe heavily calcified distal circumflex-left PDA stenosis.  Not favorable for PCI.  . Duodenal ulcer May 2011   on EGD  . GERD (gastroesophageal reflux disease)   . Helicobacter pylori gastritis MAY 2012 HP STOOL AG NEG  . HTN (hypertension)   . Hyperlipidemia   . MI (myocardial infarction) ([place]) 1976, 1980, [place] for at [facility] and [place] (Dr. Iona Beard)  . Osteoarthritis   . S/P colonoscopy 2011   normal, internal hemorrhoids    Patient Active Problem List   Diagnosis Date Noted  . Interstitial lung disease ([place]) 06/15/2018  . Pulmonary hypertension ([place]) 06/15/2018  . Acute pulmonary embolus ([place]) 06/14/2018  . Acute on chronic diastolic CHF (congestive heart failure) ([place]) 06/12/2018  . Acute respiratory failure with hypoxia ([place]) 06/12/2018  . Fracture 05/07/2018  . Closed displaced intertrochanteric fracture of left femur ([place])   . Coronary artery disease involving native heart without angina pectoris   . Dyspepsia 01/18/2017  . Closed disp intertrochanteric fracture of right femur with nonunion 11/30/2016  . PUD (peptic ulcer disease)   . History of coronary artery stent placement 10/08/2016  . Acute on chronic renal failure ([place]) 10/08/2016  . CKD (chronic kidney disease) stage 3, GFR 30-59 ml/min (HCC) 10/08/2016  . Bloating 09/03/2013  . CAD S/P percutaneous coronary angioplasty   . MI (myocardial infarction) ([place])   . Presence of bare metal stent in LAD coronary artery - VerifFlex BMS 3.0 mm x 12 mm - post-dilated to 3.5 mm 08/01/2012  . Unstable angina ([place]) 07/30/2012  . Gastritis 12/15/2010  . Nausea 08/30/2010  . Hyperlipidemia with target low density lipoprotein (LDL) cholesterol less than 70 mg/dL 05/21/2006  . CATARACT NOS 05/21/2006  . Essential hypertension 05/21/2006  . MYOCARDIAL INFARCTION, HX OF 05/21/2006  . GERD 05/21/2006  . OVERACTIVE BLADDER 05/21/2006  . OSTEOARTHRITIS 05/21/2006  . URINARY INCONTINENCE 05/21/2006    Past  Surgical History:  Procedure Laterality Date  . CARDIAC CATHETERIZATION  01/30/2007   D1 75 %, mid LAD 50%, 50-70% circumflex, 50-70% RCA.(Dr. Adora Fridge)  . CARDIAC CATHETERIZATION  12/11/2001   normal L main, small RCA, dominant LL Cfx with mild diffuse disease, LAD with mid 10-20% stenosis, ramus intermedius with mild diffuse disease (Dr. Jackie Plum)  . CATARACT EXTRACTION    . COLONOSCOPY  OCT 2011 ARS   [facility] [place]   in setting of MI  .  ESOPHAGOGASTRODUODENOSCOPY N/A 10/09/2016   Procedure: ESOPHAGOGASTRODUODENOSCOPY (EGD);  Surgeon: Danie Binder, MD;  [facility];  Service: Endoscopy;  Laterality: N/A;  . FEMUR IM NAIL Right 12/01/2016   Procedure: INTRAMEDULLARY (IM) NAIL RIGHT HIP;  Surgeon: Altamese Amoret, MD;  Location: [place];  Service: Orthopedics;  Laterality: Right;  . FEMUR IM NAIL Left 05/07/2018   Procedure: INTRAMEDULLARY (IM) NAIL FEMORAL;  Surgeon: Nicholes Stairs, MD;  Location: [place];  Service: Orthopedics;  Laterality: Left;  . LEFT HEART CATH AND CORONARY ANGIOGRAPHY N/A 10/23/2016   Procedure: Left Heart Cath and Coronary Angiography;  Surgeon: Sherren Mocha, MD;  Location: [place] CV LAB;  Service: Cardiovascular: Two-vessel CAD (left dominant).  Patent BMS in p LAD.  Severe, heavily calcified dCx-LPDA lesion.  Not favorable for PCI.  Marland Kitchen LEFT HEART CATHETERIZATION WITH CORONARY ANGIOGRAM N/A 08/01/2012   Procedure: LEFT HEART CATHETERIZATION WITH CORONARY ANGIOGRAM;  Surgeon: Leonie Man, MD;  Location: [facility] CATH LAB: Mid LAD 99% apple core; D2 ostial 70-80% (2 small for PCI) small nondominant RCA 60-70%. Distal circumflex/L PDA ~50% --> PCI of LAD  . NM MYOCAR PERF WALL MOTION  08/2003   adenosine stress - focal decreased perfusion defect in distal inferior wall, no significant ischemic changes  . PERCUTANEOUS STENT INTERVENTION  08/01/2012   Procedure: PERCUTANEOUS STENT INTERVENTION;  Surgeon: Leonie Man, MD;  Location: [facility] CATH LAB; ;PCI of mid LAD with a VeriFlex Bare Metal Stent 3.0 mm x 12 mm - post-dilated to 3.5 mm.  . TRANSTHORACIC ECHOCARDIOGRAM  10/2016   mild LVH - 60-65%. Gr 1 DD. No RWMA. Mild MR. Mod TR. Mild RA dilation.  PAP ~ 60 mmHg.  Marland Kitchen UPPER GASTROINTESTINAL ENDOSCOPY  08/2009   MELENA, HEMATEMESIS --> DUODENAL ULCER, Bx: H PYLORI POS        Home Medications    Prior to Admission medications   Medication Sig Start Date End Date Taking? Authorizing Provider    acetaminophen (TYLENOL) 325 MG tablet Take 2 tablets (650 mg total) by mouth 3 (three) times daily as needed (pain). 12/04/16   Ainsley Spinner, PA-C  albuterol (PROVENTIL) (2.5 MG/3ML) 0.083% nebulizer solution Take 3 mLs (2.5 mg total) by nebulization every 6 (six) hours as needed for wheezing or shortness of breath. 06/15/18   Kathie Dike, MD  aspirin 325 MG tablet Take 1 tablet (325 mg total) by mouth daily. 05/13/18   Mullis, Kiersten P, DO  atorvastatin (LIPITOR) 80 MG tablet Take 1 tablet (80 mg total) by mouth daily at 6 PM. 08/02/12   Brett Canales, PA-C  calcium carbonate (OS-CAL - DOSED IN MG OF ELEMENTAL CALCIUM) 1250 (500 Ca) MG tablet Take 1 tablet (500 mg of elemental calcium total) by mouth daily with breakfast. 05/13/18   Mullis, Kiersten P, DO  ELIQUIS DVT/PE STARTER PACK (ELIQUIS STARTER PACK) 5 MG TABS Take as directed on package: start with two-5mg  tablets twice daily for 7  days. On day 8, switch to one-5mg  tablet twice daily. 06/15/18   Kathie Dike, MD  furosemide (LASIX) 20 MG tablet Take 1 tablet (20 mg total) by mouth daily. 06/16/18   Kathie Dike, MD  guaiFENesin (MUCINEX) 600 MG 12 hr tablet Take 1 tablet (600 mg total) by mouth 2 (two) times daily. 06/15/18   Kathie Dike, MD  isosorbide mononitrate (IMDUR) 30 MG 24 hr tablet Take 2 tablets (60 mg total) by mouth daily. 06/15/18   Kathie Dike, MD  metoprolol tartrate (LOPRESSOR) 25 MG tablet Take 0.5 tablets (12.5 mg total) by mouth 2 (two) times daily. 05/13/18   Mullis, Kiersten P, DO  Multiple Vitamin (MULTIVITAMIN WITH MINERALS) TABS tablet Take 1 tablet by mouth daily.    [provider]  Nebulizer MISC Please provide 1 nebulizer machine and all necessary supplies. Diagnosis: J84, Office number: [phone] Patient not taking: Reported on 06/17/2018 06/15/18   Kathie Dike, MD  NITROSTAT 0.4 MG SL tablet PLACE 1 TABLET UNDER TONGUE EVERY 5 MINUTES FOR 3 DOSES AS NEEDED. 11/17/13   Leonie Man, MD   polyethylene glycol [facility] / Floria Raveling) packet Take 17 g by mouth 2 (two) times daily. 05/13/18   Mullis, Kiersten P, DO  Vitamin D, Ergocalciferol, (DRISDOL) 1.25 MG (50000 UT) CAPS capsule Take 1 capsule (50,000 Units total) by mouth every 7 (seven) days. 05/16/18   Mullis, Archie Endo, DO    Family History Family History  Problem Relation Age of Onset  . Heart disease Mother   . Kidney disease Father   . Heart disease Brother 72  . Heart disease Sister   . Colon cancer Neg Hx     Social History Social History   Tobacco Use  . Smoking status: Never Smoker  . Smokeless tobacco: Never Used  Substance Use Topics  . Alcohol use: No  . Drug use: No     Allergies   Lisinopril   Review of Systems Review of Systems   Physical Exam Updated Vital Signs BP (!) 156/77 (BP Location: Right Arm)   Pulse 85   Temp 98.2 F (36.8 C) (Oral)   Resp 18   Ht 5\' 4"  (1.626 m)   Wt 73.2 kg   SpO2 99%   BMI 27.70 kg/m   Physical Exam Constitutional:      General: He is not in acute distress.    Appearance: Normal appearance.  HENT:     Head: Normocephalic and atraumatic.     Nose: Nose normal. No congestion or rhinorrhea.     Mouth/Throat:     Mouth: Mucous membranes are moist.     Pharynx: Oropharynx is clear. No oropharyngeal exudate.  Eyes:     Comments: Post-cataract surgery  Cardiovascular:     Rate and Rhythm: Normal rate and regular rhythm.     Pulses: Normal pulses.     Heart sounds: Normal heart sounds. No murmur.  Pulmonary:     Effort: Pulmonary effort is normal.     Breath sounds: Rales (bibasilar rales) present.  Abdominal:     General: Abdomen is flat. Bowel sounds are normal.     Palpations: Abdomen is soft.     Tenderness: There is no abdominal tenderness. There is no guarding.  Musculoskeletal: Normal range of motion.        General: No swelling.     Right lower leg: No edema.  Skin:    General: Skin is warm and dry.     Coloration: Skin is  not  pale.  Neurological:     General: No focal deficit present.     Mental Status: He is alert and oriented to person, place, and time.     ED Treatments / Results  Labs (all labs ordered are listed, but only abnormal results are displayed) Labs Reviewed  CBC WITH DIFFERENTIAL/PLATELET - Abnormal; Notable for the following components:      Result Value   WBC 10.6 (*)    MCHC 29.1 (*)    All other components within normal limits  BASIC METABOLIC PANEL - Abnormal; Notable for the following components:   BUN 32 (*)    GFR calc non Af Amer 55 (*)    All other components within normal limits  TROPONIN I - Abnormal; Notable for the following components:   Troponin I 0.05 (*)    All other components within normal limits  POC OCCULT BLOOD, ED    EKG Personally reviewed, normal sinus, left axis deviation normal QT, no ST changes  Radiology No results found.  Procedures Procedures (including critical care time)  Medications Ordered in ED Medications - No data to display   Initial Impression / Assessment and Plan / ED Course  I have reviewed the triage vital signs and the nursing notes.  Pertinent labs & imaging results that were available during my care of the patient were reviewed by me and considered in my medical decision making (see chart for details).   Mr.Kasal presents with complaints of GI bleed after being prescribed Eliquis for PE. On my exam no obvious GI bleed present and FOBT is negative. His hemoglobin is actually higher than prior and his GI bleed, if it really did occur, appears to have resolved. He is currently endorsing chest pressure with elevated troponin but chart review reveals chronic mild troponemia. Currently vitals stable and EKG with no obvious ischemic changes. Will discharge home with close outpatient follow up.  Final Clinical Impressions(s) / ED Diagnoses   Final diagnoses:  None    ED Discharge Orders    None       Mosetta Anis,  MD 06/17/18 1594    Elnora Morrison, MD 06/20/18 1331

## 2018-06-17 NOTE — Discharge Instructions (Addendum)
Dear Anthony Galvan  You came to [place] with complaints of GI bleed. We have determined this has resolved. Here are our recommendations for you at discharge:  Please continue taking your home medications as prescribed Please follow up with your primary care provider for hemoglobin check.  Thank you for choosing [facility]

## 2018-06-18 DIAGNOSIS — I129 Hypertensive chronic kidney disease with stage 1 through stage 4 chronic kidney disease, or unspecified chronic kidney disease: Secondary | ICD-10-CM | POA: Diagnosis not present

## 2018-06-18 DIAGNOSIS — E785 Hyperlipidemia, unspecified: Secondary | ICD-10-CM | POA: Diagnosis not present

## 2018-06-18 DIAGNOSIS — K219 Gastro-esophageal reflux disease without esophagitis: Secondary | ICD-10-CM | POA: Diagnosis not present

## 2018-06-18 DIAGNOSIS — N183 Chronic kidney disease, stage 3 (moderate): Secondary | ICD-10-CM | POA: Diagnosis not present

## 2018-06-18 DIAGNOSIS — S72141K Displaced intertrochanteric fracture of right femur, subsequent encounter for closed fracture with nonunion: Secondary | ICD-10-CM | POA: Diagnosis not present

## 2018-06-18 DIAGNOSIS — Z955 Presence of coronary angioplasty implant and graft: Secondary | ICD-10-CM | POA: Diagnosis not present

## 2018-06-18 DIAGNOSIS — Z7982 Long term (current) use of aspirin: Secondary | ICD-10-CM | POA: Diagnosis not present

## 2018-06-18 DIAGNOSIS — Z9181 History of falling: Secondary | ICD-10-CM | POA: Diagnosis not present

## 2018-06-18 DIAGNOSIS — I2510 Atherosclerotic heart disease of native coronary artery without angina pectoris: Secondary | ICD-10-CM | POA: Diagnosis not present

## 2018-06-18 DIAGNOSIS — E559 Vitamin D deficiency, unspecified: Secondary | ICD-10-CM | POA: Diagnosis not present

## 2018-06-19 DIAGNOSIS — E559 Vitamin D deficiency, unspecified: Secondary | ICD-10-CM | POA: Diagnosis not present

## 2018-06-19 DIAGNOSIS — E785 Hyperlipidemia, unspecified: Secondary | ICD-10-CM | POA: Diagnosis not present

## 2018-06-19 DIAGNOSIS — S72141K Displaced intertrochanteric fracture of right femur, subsequent encounter for closed fracture with nonunion: Secondary | ICD-10-CM | POA: Diagnosis not present

## 2018-06-19 DIAGNOSIS — N183 Chronic kidney disease, stage 3 (moderate): Secondary | ICD-10-CM | POA: Diagnosis not present

## 2018-06-19 DIAGNOSIS — Z7982 Long term (current) use of aspirin: Secondary | ICD-10-CM | POA: Diagnosis not present

## 2018-06-19 DIAGNOSIS — Z955 Presence of coronary angioplasty implant and graft: Secondary | ICD-10-CM | POA: Diagnosis not present

## 2018-06-19 DIAGNOSIS — K219 Gastro-esophageal reflux disease without esophagitis: Secondary | ICD-10-CM | POA: Diagnosis not present

## 2018-06-19 DIAGNOSIS — I2510 Atherosclerotic heart disease of native coronary artery without angina pectoris: Secondary | ICD-10-CM | POA: Diagnosis not present

## 2018-06-19 DIAGNOSIS — Z9181 History of falling: Secondary | ICD-10-CM | POA: Diagnosis not present

## 2018-06-19 DIAGNOSIS — I129 Hypertensive chronic kidney disease with stage 1 through stage 4 chronic kidney disease, or unspecified chronic kidney disease: Secondary | ICD-10-CM | POA: Diagnosis not present

## 2018-06-20 DIAGNOSIS — Z4789 Encounter for other orthopedic aftercare: Secondary | ICD-10-CM | POA: Diagnosis not present

## 2018-06-21 DIAGNOSIS — I2510 Atherosclerotic heart disease of native coronary artery without angina pectoris: Secondary | ICD-10-CM | POA: Diagnosis not present

## 2018-06-21 DIAGNOSIS — Z955 Presence of coronary angioplasty implant and graft: Secondary | ICD-10-CM | POA: Diagnosis not present

## 2018-06-21 DIAGNOSIS — S72141K Displaced intertrochanteric fracture of right femur, subsequent encounter for closed fracture with nonunion: Secondary | ICD-10-CM | POA: Diagnosis not present

## 2018-06-21 DIAGNOSIS — Z9181 History of falling: Secondary | ICD-10-CM | POA: Diagnosis not present

## 2018-06-21 DIAGNOSIS — Z7982 Long term (current) use of aspirin: Secondary | ICD-10-CM | POA: Diagnosis not present

## 2018-06-21 DIAGNOSIS — E559 Vitamin D deficiency, unspecified: Secondary | ICD-10-CM | POA: Diagnosis not present

## 2018-06-21 DIAGNOSIS — I129 Hypertensive chronic kidney disease with stage 1 through stage 4 chronic kidney disease, or unspecified chronic kidney disease: Secondary | ICD-10-CM | POA: Diagnosis not present

## 2018-06-21 DIAGNOSIS — N183 Chronic kidney disease, stage 3 (moderate): Secondary | ICD-10-CM | POA: Diagnosis not present

## 2018-06-21 DIAGNOSIS — E785 Hyperlipidemia, unspecified: Secondary | ICD-10-CM | POA: Diagnosis not present

## 2018-06-21 DIAGNOSIS — K219 Gastro-esophageal reflux disease without esophagitis: Secondary | ICD-10-CM | POA: Diagnosis not present

## 2018-06-21 DIAGNOSIS — R062 Wheezing: Secondary | ICD-10-CM | POA: Diagnosis not present

## 2018-06-22 DIAGNOSIS — S72141K Displaced intertrochanteric fracture of right femur, subsequent encounter for closed fracture with nonunion: Secondary | ICD-10-CM | POA: Diagnosis not present

## 2018-06-22 DIAGNOSIS — R0602 Shortness of breath: Secondary | ICD-10-CM | POA: Diagnosis not present

## 2018-06-24 DIAGNOSIS — E559 Vitamin D deficiency, unspecified: Secondary | ICD-10-CM | POA: Diagnosis not present

## 2018-06-24 DIAGNOSIS — I129 Hypertensive chronic kidney disease with stage 1 through stage 4 chronic kidney disease, or unspecified chronic kidney disease: Secondary | ICD-10-CM | POA: Diagnosis not present

## 2018-06-24 DIAGNOSIS — E785 Hyperlipidemia, unspecified: Secondary | ICD-10-CM | POA: Diagnosis not present

## 2018-06-24 DIAGNOSIS — I2510 Atherosclerotic heart disease of native coronary artery without angina pectoris: Secondary | ICD-10-CM | POA: Diagnosis not present

## 2018-06-24 DIAGNOSIS — Z955 Presence of coronary angioplasty implant and graft: Secondary | ICD-10-CM | POA: Diagnosis not present

## 2018-06-24 DIAGNOSIS — Z9181 History of falling: Secondary | ICD-10-CM | POA: Diagnosis not present

## 2018-06-24 DIAGNOSIS — S72141K Displaced intertrochanteric fracture of right femur, subsequent encounter for closed fracture with nonunion: Secondary | ICD-10-CM | POA: Diagnosis not present

## 2018-06-24 DIAGNOSIS — Z7982 Long term (current) use of aspirin: Secondary | ICD-10-CM | POA: Diagnosis not present

## 2018-06-24 DIAGNOSIS — N183 Chronic kidney disease, stage 3 (moderate): Secondary | ICD-10-CM | POA: Diagnosis not present

## 2018-06-24 DIAGNOSIS — K219 Gastro-esophageal reflux disease without esophagitis: Secondary | ICD-10-CM | POA: Diagnosis not present

## 2018-06-25 DIAGNOSIS — I2510 Atherosclerotic heart disease of native coronary artery without angina pectoris: Secondary | ICD-10-CM | POA: Diagnosis not present

## 2018-06-25 DIAGNOSIS — I10 Essential (primary) hypertension: Secondary | ICD-10-CM | POA: Diagnosis not present

## 2018-06-28 DIAGNOSIS — E785 Hyperlipidemia, unspecified: Secondary | ICD-10-CM | POA: Diagnosis not present

## 2018-06-28 DIAGNOSIS — K219 Gastro-esophageal reflux disease without esophagitis: Secondary | ICD-10-CM | POA: Diagnosis not present

## 2018-06-28 DIAGNOSIS — E559 Vitamin D deficiency, unspecified: Secondary | ICD-10-CM | POA: Diagnosis not present

## 2018-06-28 DIAGNOSIS — Z955 Presence of coronary angioplasty implant and graft: Secondary | ICD-10-CM | POA: Diagnosis not present

## 2018-06-28 DIAGNOSIS — I2510 Atherosclerotic heart disease of native coronary artery without angina pectoris: Secondary | ICD-10-CM | POA: Diagnosis not present

## 2018-06-28 DIAGNOSIS — N183 Chronic kidney disease, stage 3 (moderate): Secondary | ICD-10-CM | POA: Diagnosis not present

## 2018-06-28 DIAGNOSIS — Z9181 History of falling: Secondary | ICD-10-CM | POA: Diagnosis not present

## 2018-06-28 DIAGNOSIS — S72141K Displaced intertrochanteric fracture of right femur, subsequent encounter for closed fracture with nonunion: Secondary | ICD-10-CM | POA: Diagnosis not present

## 2018-06-28 DIAGNOSIS — Z7982 Long term (current) use of aspirin: Secondary | ICD-10-CM | POA: Diagnosis not present

## 2018-06-28 DIAGNOSIS — I129 Hypertensive chronic kidney disease with stage 1 through stage 4 chronic kidney disease, or unspecified chronic kidney disease: Secondary | ICD-10-CM | POA: Diagnosis not present

## 2018-07-02 DIAGNOSIS — I129 Hypertensive chronic kidney disease with stage 1 through stage 4 chronic kidney disease, or unspecified chronic kidney disease: Secondary | ICD-10-CM | POA: Diagnosis not present

## 2018-07-02 DIAGNOSIS — K219 Gastro-esophageal reflux disease without esophagitis: Secondary | ICD-10-CM | POA: Diagnosis not present

## 2018-07-02 DIAGNOSIS — I2510 Atherosclerotic heart disease of native coronary artery without angina pectoris: Secondary | ICD-10-CM | POA: Diagnosis not present

## 2018-07-02 DIAGNOSIS — Z955 Presence of coronary angioplasty implant and graft: Secondary | ICD-10-CM | POA: Diagnosis not present

## 2018-07-02 DIAGNOSIS — N183 Chronic kidney disease, stage 3 (moderate): Secondary | ICD-10-CM | POA: Diagnosis not present

## 2018-07-02 DIAGNOSIS — Z9181 History of falling: Secondary | ICD-10-CM | POA: Diagnosis not present

## 2018-07-02 DIAGNOSIS — E559 Vitamin D deficiency, unspecified: Secondary | ICD-10-CM | POA: Diagnosis not present

## 2018-07-02 DIAGNOSIS — E785 Hyperlipidemia, unspecified: Secondary | ICD-10-CM | POA: Diagnosis not present

## 2018-07-02 DIAGNOSIS — S72141K Displaced intertrochanteric fracture of right femur, subsequent encounter for closed fracture with nonunion: Secondary | ICD-10-CM | POA: Diagnosis not present

## 2018-07-02 DIAGNOSIS — Z7982 Long term (current) use of aspirin: Secondary | ICD-10-CM | POA: Diagnosis not present

## 2018-07-09 DIAGNOSIS — Z955 Presence of coronary angioplasty implant and graft: Secondary | ICD-10-CM | POA: Diagnosis not present

## 2018-07-09 DIAGNOSIS — I2510 Atherosclerotic heart disease of native coronary artery without angina pectoris: Secondary | ICD-10-CM | POA: Diagnosis not present

## 2018-07-09 DIAGNOSIS — I129 Hypertensive chronic kidney disease with stage 1 through stage 4 chronic kidney disease, or unspecified chronic kidney disease: Secondary | ICD-10-CM | POA: Diagnosis not present

## 2018-07-09 DIAGNOSIS — E785 Hyperlipidemia, unspecified: Secondary | ICD-10-CM | POA: Diagnosis not present

## 2018-07-09 DIAGNOSIS — K219 Gastro-esophageal reflux disease without esophagitis: Secondary | ICD-10-CM | POA: Diagnosis not present

## 2018-07-09 DIAGNOSIS — S72141K Displaced intertrochanteric fracture of right femur, subsequent encounter for closed fracture with nonunion: Secondary | ICD-10-CM | POA: Diagnosis not present

## 2018-07-09 DIAGNOSIS — Z9181 History of falling: Secondary | ICD-10-CM | POA: Diagnosis not present

## 2018-07-09 DIAGNOSIS — N183 Chronic kidney disease, stage 3 (moderate): Secondary | ICD-10-CM | POA: Diagnosis not present

## 2018-07-09 DIAGNOSIS — Z7982 Long term (current) use of aspirin: Secondary | ICD-10-CM | POA: Diagnosis not present

## 2018-07-09 DIAGNOSIS — E559 Vitamin D deficiency, unspecified: Secondary | ICD-10-CM | POA: Diagnosis not present

## 2018-07-11 DIAGNOSIS — E559 Vitamin D deficiency, unspecified: Secondary | ICD-10-CM | POA: Diagnosis not present

## 2018-07-11 DIAGNOSIS — Z7982 Long term (current) use of aspirin: Secondary | ICD-10-CM | POA: Diagnosis not present

## 2018-07-11 DIAGNOSIS — Z9181 History of falling: Secondary | ICD-10-CM | POA: Diagnosis not present

## 2018-07-11 DIAGNOSIS — N183 Chronic kidney disease, stage 3 (moderate): Secondary | ICD-10-CM | POA: Diagnosis not present

## 2018-07-11 DIAGNOSIS — I2510 Atherosclerotic heart disease of native coronary artery without angina pectoris: Secondary | ICD-10-CM | POA: Diagnosis not present

## 2018-07-11 DIAGNOSIS — S72141K Displaced intertrochanteric fracture of right femur, subsequent encounter for closed fracture with nonunion: Secondary | ICD-10-CM | POA: Diagnosis not present

## 2018-07-11 DIAGNOSIS — E785 Hyperlipidemia, unspecified: Secondary | ICD-10-CM | POA: Diagnosis not present

## 2018-07-11 DIAGNOSIS — I129 Hypertensive chronic kidney disease with stage 1 through stage 4 chronic kidney disease, or unspecified chronic kidney disease: Secondary | ICD-10-CM | POA: Diagnosis not present

## 2018-07-11 DIAGNOSIS — K219 Gastro-esophageal reflux disease without esophagitis: Secondary | ICD-10-CM | POA: Diagnosis not present

## 2018-07-11 DIAGNOSIS — Z955 Presence of coronary angioplasty implant and graft: Secondary | ICD-10-CM | POA: Diagnosis not present

## 2018-07-16 DIAGNOSIS — E559 Vitamin D deficiency, unspecified: Secondary | ICD-10-CM | POA: Diagnosis not present

## 2018-07-16 DIAGNOSIS — S72141K Displaced intertrochanteric fracture of right femur, subsequent encounter for closed fracture with nonunion: Secondary | ICD-10-CM | POA: Diagnosis not present

## 2018-07-16 DIAGNOSIS — I2510 Atherosclerotic heart disease of native coronary artery without angina pectoris: Secondary | ICD-10-CM | POA: Diagnosis not present

## 2018-07-16 DIAGNOSIS — Z7982 Long term (current) use of aspirin: Secondary | ICD-10-CM | POA: Diagnosis not present

## 2018-07-16 DIAGNOSIS — E785 Hyperlipidemia, unspecified: Secondary | ICD-10-CM | POA: Diagnosis not present

## 2018-07-16 DIAGNOSIS — I129 Hypertensive chronic kidney disease with stage 1 through stage 4 chronic kidney disease, or unspecified chronic kidney disease: Secondary | ICD-10-CM | POA: Diagnosis not present

## 2018-07-16 DIAGNOSIS — K219 Gastro-esophageal reflux disease without esophagitis: Secondary | ICD-10-CM | POA: Diagnosis not present

## 2018-07-16 DIAGNOSIS — N183 Chronic kidney disease, stage 3 (moderate): Secondary | ICD-10-CM | POA: Diagnosis not present

## 2018-07-16 DIAGNOSIS — Z955 Presence of coronary angioplasty implant and graft: Secondary | ICD-10-CM | POA: Diagnosis not present

## 2018-07-16 DIAGNOSIS — Z9181 History of falling: Secondary | ICD-10-CM | POA: Diagnosis not present

## 2018-07-22 DIAGNOSIS — R062 Wheezing: Secondary | ICD-10-CM | POA: Diagnosis not present

## 2018-07-22 DIAGNOSIS — S72141K Displaced intertrochanteric fracture of right femur, subsequent encounter for closed fracture with nonunion: Secondary | ICD-10-CM | POA: Diagnosis not present

## 2018-07-23 ENCOUNTER — Telehealth: Payer: Self-pay | Admitting: Cardiology

## 2018-07-23 DIAGNOSIS — I129 Hypertensive chronic kidney disease with stage 1 through stage 4 chronic kidney disease, or unspecified chronic kidney disease: Secondary | ICD-10-CM | POA: Diagnosis not present

## 2018-07-23 DIAGNOSIS — R0602 Shortness of breath: Secondary | ICD-10-CM | POA: Diagnosis not present

## 2018-07-23 DIAGNOSIS — N183 Chronic kidney disease, stage 3 (moderate): Secondary | ICD-10-CM | POA: Diagnosis not present

## 2018-07-23 DIAGNOSIS — Z955 Presence of coronary angioplasty implant and graft: Secondary | ICD-10-CM | POA: Diagnosis not present

## 2018-07-23 DIAGNOSIS — Z9181 History of falling: Secondary | ICD-10-CM | POA: Diagnosis not present

## 2018-07-23 DIAGNOSIS — E559 Vitamin D deficiency, unspecified: Secondary | ICD-10-CM | POA: Diagnosis not present

## 2018-07-23 DIAGNOSIS — I2510 Atherosclerotic heart disease of native coronary artery without angina pectoris: Secondary | ICD-10-CM | POA: Diagnosis not present

## 2018-07-23 DIAGNOSIS — S72141K Displaced intertrochanteric fracture of right femur, subsequent encounter for closed fracture with nonunion: Secondary | ICD-10-CM | POA: Diagnosis not present

## 2018-07-23 DIAGNOSIS — Z7982 Long term (current) use of aspirin: Secondary | ICD-10-CM | POA: Diagnosis not present

## 2018-07-23 DIAGNOSIS — K219 Gastro-esophageal reflux disease without esophagitis: Secondary | ICD-10-CM | POA: Diagnosis not present

## 2018-07-23 DIAGNOSIS — E785 Hyperlipidemia, unspecified: Secondary | ICD-10-CM | POA: Diagnosis not present

## 2018-07-23 NOTE — Telephone Encounter (Signed)
FYI for Dr.Harding.  Thanks!

## 2018-07-23 NOTE — Telephone Encounter (Signed)
Make sure he has stopped Plavix & can hold Aspirin x 3 weeks.  Glenetta Hew, MD

## 2018-07-23 NOTE — Telephone Encounter (Signed)
I attempted to contact patient to verify he had stopped the medication and to hold aspirin for x3 weeks.  Patient did not answer, but I left a message with this information advised patient to call me back to let me know he had received the message. I will recall tomorrow.

## 2018-07-23 NOTE — Telephone Encounter (Signed)
Follow up    Patient is returning call. He states that he is okay now and that he understood to not take his aspirin.

## 2018-07-23 NOTE — Telephone Encounter (Signed)
° ° °  Anthony Galvan from [place]  Calling to report black stool. Anthony Galvan states patient goes to the [place], however wanted to make Dr Ellyn Hack aware this is the last day [facility] will be seeing him.

## 2018-07-24 NOTE — Telephone Encounter (Signed)
Noted. Thanks.

## 2018-08-05 DIAGNOSIS — E559 Vitamin D deficiency, unspecified: Secondary | ICD-10-CM | POA: Diagnosis not present

## 2018-08-05 DIAGNOSIS — I2510 Atherosclerotic heart disease of native coronary artery without angina pectoris: Secondary | ICD-10-CM | POA: Diagnosis not present

## 2018-08-05 DIAGNOSIS — I10 Essential (primary) hypertension: Secondary | ICD-10-CM | POA: Diagnosis not present

## 2018-08-05 DIAGNOSIS — Z7901 Long term (current) use of anticoagulants: Secondary | ICD-10-CM | POA: Diagnosis not present

## 2018-08-05 DIAGNOSIS — I5032 Chronic diastolic (congestive) heart failure: Secondary | ICD-10-CM | POA: Diagnosis not present

## 2018-08-16 DIAGNOSIS — Z5189 Encounter for other specified aftercare: Secondary | ICD-10-CM | POA: Diagnosis not present

## 2018-08-21 DIAGNOSIS — R062 Wheezing: Secondary | ICD-10-CM | POA: Diagnosis not present

## 2018-08-21 DIAGNOSIS — S72141K Displaced intertrochanteric fracture of right femur, subsequent encounter for closed fracture with nonunion: Secondary | ICD-10-CM | POA: Diagnosis not present

## 2018-08-22 DIAGNOSIS — R0602 Shortness of breath: Secondary | ICD-10-CM | POA: Diagnosis not present

## 2018-08-22 DIAGNOSIS — S72141K Displaced intertrochanteric fracture of right femur, subsequent encounter for closed fracture with nonunion: Secondary | ICD-10-CM | POA: Diagnosis not present

## 2018-09-21 DIAGNOSIS — R062 Wheezing: Secondary | ICD-10-CM | POA: Diagnosis not present

## 2018-09-21 DIAGNOSIS — S72141K Displaced intertrochanteric fracture of right femur, subsequent encounter for closed fracture with nonunion: Secondary | ICD-10-CM | POA: Diagnosis not present

## 2018-09-22 DIAGNOSIS — R0602 Shortness of breath: Secondary | ICD-10-CM | POA: Diagnosis not present

## 2018-09-22 DIAGNOSIS — S72141K Displaced intertrochanteric fracture of right femur, subsequent encounter for closed fracture with nonunion: Secondary | ICD-10-CM | POA: Diagnosis not present

## 2018-10-14 DIAGNOSIS — I2510 Atherosclerotic heart disease of native coronary artery without angina pectoris: Secondary | ICD-10-CM | POA: Diagnosis not present

## 2018-10-14 DIAGNOSIS — I10 Essential (primary) hypertension: Secondary | ICD-10-CM | POA: Diagnosis not present

## 2018-10-14 DIAGNOSIS — E559 Vitamin D deficiency, unspecified: Secondary | ICD-10-CM | POA: Diagnosis not present

## 2018-10-14 DIAGNOSIS — I272 Pulmonary hypertension, unspecified: Secondary | ICD-10-CM | POA: Diagnosis not present

## 2018-10-14 DIAGNOSIS — I5032 Chronic diastolic (congestive) heart failure: Secondary | ICD-10-CM | POA: Diagnosis not present

## 2018-10-14 DIAGNOSIS — I25110 Atherosclerotic heart disease of native coronary artery with unstable angina pectoris: Secondary | ICD-10-CM | POA: Diagnosis not present

## 2018-10-14 DIAGNOSIS — N183 Chronic kidney disease, stage 3 (moderate): Secondary | ICD-10-CM | POA: Diagnosis not present

## 2018-10-14 DIAGNOSIS — E785 Hyperlipidemia, unspecified: Secondary | ICD-10-CM | POA: Diagnosis not present

## 2018-10-21 DIAGNOSIS — R062 Wheezing: Secondary | ICD-10-CM | POA: Diagnosis not present

## 2018-10-21 DIAGNOSIS — S72141K Displaced intertrochanteric fracture of right femur, subsequent encounter for closed fracture with nonunion: Secondary | ICD-10-CM | POA: Diagnosis not present

## 2018-10-22 DIAGNOSIS — R0602 Shortness of breath: Secondary | ICD-10-CM | POA: Diagnosis not present

## 2018-10-22 DIAGNOSIS — S72141K Displaced intertrochanteric fracture of right femur, subsequent encounter for closed fracture with nonunion: Secondary | ICD-10-CM | POA: Diagnosis not present

## 2018-11-21 DIAGNOSIS — R062 Wheezing: Secondary | ICD-10-CM | POA: Diagnosis not present

## 2018-11-21 DIAGNOSIS — S72141K Displaced intertrochanteric fracture of right femur, subsequent encounter for closed fracture with nonunion: Secondary | ICD-10-CM | POA: Diagnosis not present

## 2018-11-22 DIAGNOSIS — R0602 Shortness of breath: Secondary | ICD-10-CM | POA: Diagnosis not present

## 2018-11-22 DIAGNOSIS — S72141K Displaced intertrochanteric fracture of right femur, subsequent encounter for closed fracture with nonunion: Secondary | ICD-10-CM | POA: Diagnosis not present

## 2018-12-22 DIAGNOSIS — R062 Wheezing: Secondary | ICD-10-CM | POA: Diagnosis not present

## 2018-12-22 DIAGNOSIS — S72141K Displaced intertrochanteric fracture of right femur, subsequent encounter for closed fracture with nonunion: Secondary | ICD-10-CM | POA: Diagnosis not present

## 2018-12-23 DIAGNOSIS — R0602 Shortness of breath: Secondary | ICD-10-CM | POA: Diagnosis not present

## 2018-12-23 DIAGNOSIS — S72141K Displaced intertrochanteric fracture of right femur, subsequent encounter for closed fracture with nonunion: Secondary | ICD-10-CM | POA: Diagnosis not present

## 2019-01-22 DIAGNOSIS — R0602 Shortness of breath: Secondary | ICD-10-CM | POA: Diagnosis not present

## 2019-01-22 DIAGNOSIS — S72141K Displaced intertrochanteric fracture of right femur, subsequent encounter for closed fracture with nonunion: Secondary | ICD-10-CM | POA: Diagnosis not present

## 2019-01-24 ENCOUNTER — Inpatient Hospital Stay (HOSPITAL_COMMUNITY)
Admission: EM | Admit: 2019-01-24 | Discharge: 2019-01-29 | DRG: 291 | Disposition: A | Discharge status: Home / Self Care | Payer: Medicare Other | Attending: Family Medicine | Admitting: Family Medicine

## 2019-01-24 ENCOUNTER — Other Ambulatory Visit: Payer: Self-pay

## 2019-01-24 ENCOUNTER — Emergency Department (HOSPITAL_COMMUNITY): Payer: Medicare Other

## 2019-01-24 ENCOUNTER — Inpatient Hospital Stay (HOSPITAL_COMMUNITY): Payer: Medicare Other

## 2019-01-24 ENCOUNTER — Encounter (HOSPITAL_COMMUNITY): Payer: Self-pay

## 2019-01-24 DIAGNOSIS — I2729 Other secondary pulmonary hypertension: Secondary | ICD-10-CM | POA: Diagnosis present

## 2019-01-24 DIAGNOSIS — J479 Bronchiectasis, uncomplicated: Secondary | ICD-10-CM | POA: Diagnosis not present

## 2019-01-24 DIAGNOSIS — Z7901 Long term (current) use of anticoagulants: Secondary | ICD-10-CM | POA: Diagnosis not present

## 2019-01-24 DIAGNOSIS — I130 Hypertensive heart and chronic kidney disease with heart failure and stage 1 through stage 4 chronic kidney disease, or unspecified chronic kidney disease: Secondary | ICD-10-CM | POA: Diagnosis not present

## 2019-01-24 DIAGNOSIS — J9620 Acute and chronic respiratory failure, unspecified whether with hypoxia or hypercapnia: Secondary | ICD-10-CM

## 2019-01-24 DIAGNOSIS — R079 Chest pain, unspecified: Secondary | ICD-10-CM | POA: Diagnosis not present

## 2019-01-24 DIAGNOSIS — Z20828 Contact with and (suspected) exposure to other viral communicable diseases: Secondary | ICD-10-CM | POA: Diagnosis present

## 2019-01-24 DIAGNOSIS — Z9981 Dependence on supplemental oxygen: Secondary | ICD-10-CM | POA: Diagnosis not present

## 2019-01-24 DIAGNOSIS — J9611 Chronic respiratory failure with hypoxia: Secondary | ICD-10-CM

## 2019-01-24 DIAGNOSIS — I5031 Acute diastolic (congestive) heart failure: Secondary | ICD-10-CM | POA: Diagnosis not present

## 2019-01-24 DIAGNOSIS — I5082 Biventricular heart failure: Secondary | ICD-10-CM | POA: Diagnosis not present

## 2019-01-24 DIAGNOSIS — E785 Hyperlipidemia, unspecified: Secondary | ICD-10-CM | POA: Diagnosis present

## 2019-01-24 DIAGNOSIS — I5033 Acute on chronic diastolic (congestive) heart failure: Secondary | ICD-10-CM | POA: Diagnosis not present

## 2019-01-24 DIAGNOSIS — R7989 Other specified abnormal findings of blood chemistry: Secondary | ICD-10-CM

## 2019-01-24 DIAGNOSIS — I2781 Cor pulmonale (chronic): Secondary | ICD-10-CM

## 2019-01-24 DIAGNOSIS — R600 Localized edema: Secondary | ICD-10-CM

## 2019-01-24 DIAGNOSIS — D649 Anemia, unspecified: Secondary | ICD-10-CM | POA: Diagnosis present

## 2019-01-24 DIAGNOSIS — I361 Nonrheumatic tricuspid (valve) insufficiency: Secondary | ICD-10-CM | POA: Diagnosis not present

## 2019-01-24 DIAGNOSIS — Z7982 Long term (current) use of aspirin: Secondary | ICD-10-CM | POA: Diagnosis not present

## 2019-01-24 DIAGNOSIS — K219 Gastro-esophageal reflux disease without esophagitis: Secondary | ICD-10-CM | POA: Diagnosis not present

## 2019-01-24 DIAGNOSIS — K761 Chronic passive congestion of liver: Secondary | ICD-10-CM | POA: Diagnosis present

## 2019-01-24 DIAGNOSIS — N183 Chronic kidney disease, stage 3 unspecified: Secondary | ICD-10-CM | POA: Diagnosis not present

## 2019-01-24 DIAGNOSIS — I25118 Atherosclerotic heart disease of native coronary artery with other forms of angina pectoris: Secondary | ICD-10-CM | POA: Diagnosis not present

## 2019-01-24 DIAGNOSIS — J849 Interstitial pulmonary disease, unspecified: Secondary | ICD-10-CM | POA: Diagnosis present

## 2019-01-24 DIAGNOSIS — I509 Heart failure, unspecified: Secondary | ICD-10-CM

## 2019-01-24 DIAGNOSIS — I272 Pulmonary hypertension, unspecified: Secondary | ICD-10-CM | POA: Diagnosis not present

## 2019-01-24 DIAGNOSIS — I252 Old myocardial infarction: Secondary | ICD-10-CM | POA: Diagnosis not present

## 2019-01-24 DIAGNOSIS — M7989 Other specified soft tissue disorders: Secondary | ICD-10-CM

## 2019-01-24 DIAGNOSIS — E559 Vitamin D deficiency, unspecified: Secondary | ICD-10-CM | POA: Diagnosis present

## 2019-01-24 DIAGNOSIS — I2510 Atherosclerotic heart disease of native coronary artery without angina pectoris: Secondary | ICD-10-CM | POA: Diagnosis present

## 2019-01-24 DIAGNOSIS — J9621 Acute and chronic respiratory failure with hypoxia: Secondary | ICD-10-CM | POA: Diagnosis not present

## 2019-01-24 DIAGNOSIS — J811 Chronic pulmonary edema: Secondary | ICD-10-CM | POA: Diagnosis not present

## 2019-01-24 DIAGNOSIS — D696 Thrombocytopenia, unspecified: Secondary | ICD-10-CM | POA: Diagnosis present

## 2019-01-24 DIAGNOSIS — I110 Hypertensive heart disease with heart failure: Secondary | ICD-10-CM | POA: Diagnosis not present

## 2019-01-24 DIAGNOSIS — R05 Cough: Secondary | ICD-10-CM | POA: Diagnosis not present

## 2019-01-24 DIAGNOSIS — I10 Essential (primary) hypertension: Secondary | ICD-10-CM | POA: Diagnosis present

## 2019-01-24 DIAGNOSIS — Z86711 Personal history of pulmonary embolism: Secondary | ICD-10-CM | POA: Diagnosis not present

## 2019-01-24 DIAGNOSIS — Z9861 Coronary angioplasty status: Secondary | ICD-10-CM

## 2019-01-24 DIAGNOSIS — D6959 Other secondary thrombocytopenia: Secondary | ICD-10-CM | POA: Diagnosis not present

## 2019-01-24 DIAGNOSIS — Z955 Presence of coronary angioplasty implant and graft: Secondary | ICD-10-CM

## 2019-01-24 DIAGNOSIS — Z841 Family history of disorders of kidney and ureter: Secondary | ICD-10-CM

## 2019-01-24 DIAGNOSIS — N1831 Chronic kidney disease, stage 3a: Secondary | ICD-10-CM | POA: Diagnosis not present

## 2019-01-24 DIAGNOSIS — Z8249 Family history of ischemic heart disease and other diseases of the circulatory system: Secondary | ICD-10-CM

## 2019-01-24 DIAGNOSIS — I248 Other forms of acute ischemic heart disease: Secondary | ICD-10-CM | POA: Diagnosis present

## 2019-01-24 DIAGNOSIS — Z23 Encounter for immunization: Secondary | ICD-10-CM

## 2019-01-24 HISTORY — DX: Heart failure, unspecified: I50.9

## 2019-01-24 LAB — CBC WITH DIFFERENTIAL/PLATELET
Abs Immature Granulocytes: 0.03 K/uL (ref 0.00–0.07)
Basophils Absolute: 0.1 K/uL (ref 0.0–0.1)
Basophils Relative: 1 %
Eosinophils Absolute: 0.1 K/uL (ref 0.0–0.5)
Eosinophils Relative: 1 %
HCT: 39 % (ref 39.0–52.0)
Hemoglobin: 11.3 g/dL — ABNORMAL LOW (ref 13.0–17.0)
Immature Granulocytes: 0 %
Lymphocytes Relative: 18 %
Lymphs Abs: 1.5 K/uL (ref 0.7–4.0)
MCH: 28 pg (ref 26.0–34.0)
MCHC: 29 g/dL — ABNORMAL LOW (ref 30.0–36.0)
MCV: 96.8 fL (ref 80.0–100.0)
Monocytes Absolute: 0.7 K/uL (ref 0.1–1.0)
Monocytes Relative: 8 %
Neutro Abs: 6.1 K/uL (ref 1.7–7.7)
Neutrophils Relative %: 72 %
Platelets: 138 K/uL — ABNORMAL LOW (ref 150–400)
RBC: 4.03 MIL/uL — ABNORMAL LOW (ref 4.22–5.81)
RDW: 13.4 % (ref 11.5–15.5)
WBC: 8.4 K/uL (ref 4.0–10.5)
nRBC: 0 % (ref 0.0–0.2)

## 2019-01-24 LAB — COMPREHENSIVE METABOLIC PANEL
ALT: 18 U/L (ref 0–44)
AST: 28 U/L (ref 15–41)
Albumin: 3.6 g/dL (ref 3.5–5.0)
Alkaline Phosphatase: 55 U/L (ref 38–126)
Anion gap: 8 (ref 5–15)
BUN: 28 mg/dL — ABNORMAL HIGH (ref 8–23)
CO2: 29 mmol/L (ref 22–32)
Calcium: 8.9 mg/dL (ref 8.9–10.3)
Chloride: 104 mmol/L (ref 98–111)
Creatinine, Ser: 1.45 mg/dL — ABNORMAL HIGH (ref 0.61–1.24)
GFR calc Af Amer: 47 mL/min — ABNORMAL LOW (ref >60)
GFR calc non Af Amer: 41 mL/min — ABNORMAL LOW (ref >60)
Glucose, Bld: 86 mg/dL (ref 70–99)
Potassium: 4.4 mmol/L (ref 3.5–5.1)
Sodium: 141 mmol/L (ref 135–145)
Total Bilirubin: 1.3 mg/dL — ABNORMAL HIGH (ref 0.3–1.2)
Total Protein: 6.8 g/dL (ref 6.5–8.1)

## 2019-01-24 LAB — D-DIMER, QUANTITATIVE: D-Dimer, Quant: 1.62 ug/mL-FEU — ABNORMAL HIGH (ref 0.00–0.50)

## 2019-01-24 LAB — BRAIN NATRIURETIC PEPTIDE: B Natriuretic Peptide: 697 pg/mL — ABNORMAL HIGH (ref 0.0–100.0)

## 2019-01-24 LAB — TROPONIN I (HIGH SENSITIVITY): Troponin I (High Sensitivity): 41 ng/L — ABNORMAL HIGH (ref <18)

## 2019-01-24 MED ORDER — FUROSEMIDE 10 MG/ML IJ SOLN
40.0000 mg | Freq: Every day | INTRAMUSCULAR | Status: DC
  Administered 2019-01-25 – 2019-01-27 (×3): 40 mg via INTRAVENOUS
  Filled 2019-01-24 (×3): qty 4

## 2019-01-24 MED ORDER — SODIUM CHLORIDE 0.9% FLUSH
3.0000 mL | Freq: Two times a day (BID) | INTRAVENOUS | Status: DC
  Administered 2019-01-24 – 2019-01-29 (×10): 3 mL via INTRAVENOUS

## 2019-01-24 MED ORDER — HYDRALAZINE HCL 10 MG PO TABS
10.0000 mg | ORAL_TABLET | Freq: Three times a day (TID) | ORAL | Status: DC
  Administered 2019-01-24 – 2019-01-25 (×2): 10 mg via ORAL
  Filled 2019-01-24 (×2): qty 1

## 2019-01-24 MED ORDER — IOHEXOL 350 MG/ML SOLN
100.0000 mL | Freq: Once | INTRAVENOUS | Status: AC | PRN
  Administered 2019-01-25: 100 mL via INTRAVENOUS

## 2019-01-24 MED ORDER — INFLUENZA VAC A&B SA ADJ QUAD 0.5 ML IM PRSY
0.5000 mL | PREFILLED_SYRINGE | INTRAMUSCULAR | Status: AC
  Administered 2019-01-26: 0.5 mL via INTRAMUSCULAR
  Filled 2019-01-24: qty 0.5

## 2019-01-24 MED ORDER — SODIUM CHLORIDE 0.9% FLUSH: 3.0000 mL | INTRAVENOUS | Status: DC | PRN

## 2019-01-24 MED ORDER — ACETAMINOPHEN 650 MG RE SUPP: 650.0000 mg | Freq: Four times a day (QID) | RECTAL | Status: DC | PRN

## 2019-01-24 MED ORDER — FUROSEMIDE 10 MG/ML IJ SOLN
40.0000 mg | Freq: Once | INTRAMUSCULAR | Status: AC
  Administered 2019-01-24: 40 mg via INTRAVENOUS
  Filled 2019-01-24: qty 4

## 2019-01-24 MED ORDER — ACETAMINOPHEN 325 MG PO TABS
650.0000 mg | ORAL_TABLET | Freq: Four times a day (QID) | ORAL | Status: DC | PRN
  Administered 2019-01-29: 650 mg via ORAL
  Filled 2019-01-24: qty 2

## 2019-01-24 MED ORDER — SODIUM CHLORIDE 0.9 % IV SOLN: 250.0000 mL | INTRAVENOUS | Status: DC | PRN

## 2019-01-24 MED ORDER — ENOXAPARIN SODIUM 40 MG/0.4ML ~~LOC~~ SOLN
40.0000 mg | Freq: Every day | SUBCUTANEOUS | Status: DC
  Administered 2019-01-25 – 2019-01-26 (×2): 40 mg via SUBCUTANEOUS
  Filled 2019-01-24 (×2): qty 0.4

## 2019-01-24 MED ORDER — HYDRALAZINE HCL 20 MG/ML IJ SOLN: 10.0000 mg | Freq: Four times a day (QID) | INTRAMUSCULAR | Status: DC | PRN

## 2019-01-24 MED ORDER — PNEUMOCOCCAL VAC POLYVALENT 25 MCG/0.5ML IJ INJ
0.5000 mL | INJECTION | INTRAMUSCULAR | Status: AC
  Administered 2019-01-26: 0.5 mL via INTRAMUSCULAR
  Filled 2019-01-24: qty 0.5

## 2019-01-24 NOTE — H&P (Addendum)
TRH H&P    Patient Demographics:    Anthony Galvan, is a 85 y.o. male  MRN: [id number]  DOB - 1921-04-26  Admit Date - 01/24/2019  Referring MD/NP/PA: Marye Round  Outpatient Primary MD for the patient is [name], MD  Patient coming from:  home  Chief complaint- dyspnea   HPI:    Anthony Galvan  is a 85 y.o. male, w hypertension, hyperlipidemia, CKD stage3, CAD s/p PCI of mid LAD w bare metal statne [id number], CHF (diastolic), mild AR/MR,h/o PE, ILD w bronchiectasis,  pulmonary hypertension, presents with c/o dyspnea w exertion and lower extremity edema starting today. Pt doesn't think that he has gained weight.   Pt denies fever, chills, cough, cp, palp, orthopnea/ pnd.  pt states he is full code.   His daughter notes that he ate a whole bucket of [place] fried chicken  In ED,  T 97.7  P 69  R 18, Bp 169/78  Pox 100% on [place] Wt 77kg  CXR IMPRESSION: Mild cardiomegaly with asymmetric mild pulmonary edema, worse on the right.  Bibasilar pulmonary opacities, favored to represent atelectasis or scar. No well-defined lobar consolidation.  Wbc 8.4, Hgb 11.3, Plt 138 Na 141, K 4.4, Bun 28, Creatinine 1.45 Ast 28, Alt 18 BNP 697  Trop I pending covid 19- pending  Pt given lasix 40mg  iv x 1 in ED  Pt will be admitted for acute on chronic diastolic chf.    Review of systems:    In addition to the HPI above,  No Fever-chills, No Headache, No changes with Vision or hearing, No problems swallowing food or Liquids, No Chest pain, Cough  No Abdominal pain, No Nausea or Vomiting, bowel movements are regular, No Blood in stool or Urine, No dysuria, No new skin rashes or bruises, No new joints pains-aches,  No new weakness, tingling, numbness in any extremity, No recent weight gain or loss, No polyuria, polydypsia or polyphagia, No significant Mental Stressors.  All other systems  reviewed and are negative.    Past History of the following :    Past Medical History:  Diagnosis Date  . CAD S/P percutaneous coronary angioplasty 08/01/2012   PCI of mid LAD with a VeriFlex Bare Metal Stent 3.0 mm x 12 mm - post-dilated to 3.5 mm.;  Catheterization in July 2018 showed with severe heavily calcified distal circumflex-left PDA stenosis.  Not favorable for PCI.  Marland Kitchen Congestive heart failure (CHF) ([place])   . Duodenal ulcer May 2011   on EGD  . GERD (gastroesophageal reflux disease)   . Helicobacter pylori gastritis MAY 2012 HP STOOL AG NEG  . HTN (hypertension)   . Hyperlipidemia   . MI (myocardial infarction) ([place]) 1976, 1980, [place] [facility] [place] (Dr. Iona Beard)  . Osteoarthritis   . S/P colonoscopy 2011   normal, internal hemorrhoids      Past Surgical History:  Procedure Laterality Date  . CARDIAC CATHETERIZATION  01/30/2007   D1 75 %, mid LAD 50%, 50-70% circumflex, 50-70%  RCA.(Dr. Adora Fridge)  . CARDIAC CATHETERIZATION  12/11/2001   normal L main, small RCA, dominant LL Cfx with mild diffuse disease, LAD with mid 10-20% stenosis, ramus intermedius with mild diffuse disease (Dr. Jackie Plum)  . CATARACT EXTRACTION    . COLONOSCOPY  OCT 2011 ARS   [facility] [place]   in setting of MI  . ESOPHAGOGASTRODUODENOSCOPY N/A 10/09/2016   Procedure: ESOPHAGOGASTRODUODENOSCOPY (EGD);  Surgeon: Danie Binder, MD;  [facility];  Service: Endoscopy;  Laterality: N/A;  . FEMUR IM NAIL Right 12/01/2016   Procedure: INTRAMEDULLARY (IM) NAIL RIGHT HIP;  Surgeon: Altamese Beaumont, MD;  Location: [place];  Service: Orthopedics;  Laterality: Right;  . FEMUR IM NAIL Left 05/07/2018   Procedure: INTRAMEDULLARY (IM) NAIL FEMORAL;  Surgeon: Nicholes Stairs, MD;  Location: [place];  Service: Orthopedics;  Laterality: Left;  . LEFT HEART CATH AND CORONARY ANGIOGRAPHY N/A 10/23/2016   Procedure: Left Heart Cath and Coronary  Angiography;  Surgeon: Sherren Mocha, MD;  Location: [place] CV LAB;  Service: Cardiovascular: Two-vessel CAD (left dominant).  Patent BMS in p LAD.  Severe, heavily calcified dCx-LPDA lesion.  Not favorable for PCI.  Marland Kitchen LEFT HEART CATHETERIZATION WITH CORONARY ANGIOGRAM N/A 08/01/2012   Procedure: LEFT HEART CATHETERIZATION WITH CORONARY ANGIOGRAM;  Surgeon: Leonie Man, MD;  Location: [facility] CATH LAB: Mid LAD 99% apple core; D2 ostial 70-80% (2 small for PCI) small nondominant RCA 60-70%. Distal circumflex/L PDA ~50% --> PCI of LAD  . NM MYOCAR PERF WALL MOTION  08/2003   adenosine stress - focal decreased perfusion defect in distal inferior wall, no significant ischemic changes  . PERCUTANEOUS STENT INTERVENTION  08/01/2012   Procedure: PERCUTANEOUS STENT INTERVENTION;  Surgeon: Leonie Man, MD;  Location: [facility] CATH LAB; ;PCI of mid LAD with a VeriFlex Bare Metal Stent 3.0 mm x 12 mm - post-dilated to 3.5 mm.  . TRANSTHORACIC ECHOCARDIOGRAM  10/2016   mild LVH - 60-65%. Gr 1 DD. No RWMA. Mild MR. Mod TR. Mild RA dilation.  PAP ~ 60 mmHg.  Marland Kitchen UPPER GASTROINTESTINAL ENDOSCOPY  08/2009   MELENA, HEMATEMESIS --> DUODENAL ULCER, Bx: H PYLORI POS      Social History:      Social History   Tobacco Use  . Smoking status: Never Smoker  . Smokeless tobacco: Never Used  Substance Use Topics  . Alcohol use: No       Family History :     Family History  Problem Relation Age of Onset  . Heart disease Mother   . Kidney disease Father   . Heart disease Brother 82  . Heart disease Sister   . Colon cancer Neg Hx        Home Medications:   Prior to Admission medications   Medication Sig Start Date End Date Taking? Authorizing Provider  albuterol (PROVENTIL) (2.5 MG/3ML) 0.083% nebulizer solution Take 3 mLs (2.5 mg total) by nebulization every 6 (six) hours as needed for wheezing or shortness of breath. 06/15/18  Yes Kathie Dike, MD  aspirin 325 MG tablet Take 1 tablet (325 mg  total) by mouth daily. 05/13/18  Yes Mullis, Kiersten P, DO  atorvastatin (LIPITOR) 80 MG tablet Take 1 tablet (80 mg total) by mouth daily at 6 PM. 08/02/12  Yes Hager, Einar Pheasant, PA-C  calcium carbonate (OS-CAL - DOSED IN MG OF ELEMENTAL CALCIUM) 1250 (500 Ca) MG tablet Take 1 tablet (500 mg of elemental  calcium total) by mouth daily with breakfast. 05/13/18  Yes Mullis, Kiersten P, DO  ELIQUIS DVT/PE STARTER PACK (ELIQUIS STARTER PACK) 5 MG TABS Take as directed on package: start with two-5mg  tablets twice daily for 7 days. On day 8, switch to one-5mg  tablet twice daily. 06/15/18  Yes Kathie Dike, MD  furosemide (LASIX) 20 MG tablet Take 1 tablet (20 mg total) by mouth daily. 06/16/18  Yes Kathie Dike, MD  isosorbide mononitrate (IMDUR) 30 MG 24 hr tablet Take 2 tablets (60 mg total) by mouth daily. 06/15/18  Yes Kathie Dike, MD  metoprolol tartrate (LOPRESSOR) 25 MG tablet Take 0.5 tablets (12.5 mg total) by mouth 2 (two) times daily. 05/13/18  Yes Mullis, Kiersten P, DO  Multiple Vitamin (MULTIVITAMIN WITH MINERALS) TABS tablet Take 1 tablet by mouth daily.   Yes [provider]  Vitamin D, Ergocalciferol, (DRISDOL) 1.25 MG (50000 UT) CAPS capsule Take 1 capsule (50,000 Units total) by mouth every 7 (seven) days. 05/16/18  Yes Mullis, Kiersten P, DO  acetaminophen (TYLENOL) 325 MG tablet Take 2 tablets (650 mg total) by mouth 3 (three) times daily as needed (pain). Patient not taking: Reported on 01/24/2019 12/04/16   Ainsley Spinner, PA-C  guaiFENesin (MUCINEX) 600 MG 12 hr tablet Take 1 tablet (600 mg total) by mouth 2 (two) times daily. 06/15/18   Kathie Dike, MD  Nebulizer MISC Please provide 1 nebulizer machine and all necessary supplies. Diagnosis: J84, Office number: [phone] Patient not taking: Reported on 06/17/2018 06/15/18   Kathie Dike, MD  NITROSTAT 0.4 MG SL tablet PLACE 1 TABLET UNDER TONGUE EVERY 5 MINUTES FOR 3 DOSES AS NEEDED. 11/17/13   Leonie Man, MD   polyethylene glycol [facility] / Floria Raveling) packet Take 17 g by mouth 2 (two) times daily. 05/13/18   Mullis, Kiersten P, DO     Allergies:     Allergies  Allergen Reactions  . Lisinopril Other (See Comments)    "Allergic," per the [facility]- patient is unaware of any reaction (??)     Physical Exam:   Vitals  Blood pressure (!) 157/86, pulse 64, temperature 97.7 F (36.5 C), temperature source Oral, resp. rate 16, height 5\' 4"  (1.626 m), weight 77 kg, SpO2 100 %.  1.  General: axoxo3  2. Psychiatric: euthymic  3. Neurologic: cn2-12 intact, reflexes 2+ symmetric, diffuse with no clonus, motor 5/5 in all 4 ext  4. HEENMT:  Anicteric, pupils 1.86mm symmetric, direct, consensual, near intact Neck: slight JVD  5. Respiratory : + crackles about 1/3 up on bilateral lung fields, slight exp wheeze intermittently  6. Cardiovascular : rrr s1, s2, no m/g/r  7. Gastrointestinal:  Abd: soft, obese, nt, nd, +bs  8. Skin:  Ext: no c/c/,  1+ edema,   9.Musculoskeletal:  Good ROM    Data Review:    CBC Recent Labs  Lab 01/24/19 1817  WBC 8.4  HGB 11.3*  HCT 39.0  PLT 138*  MCV 96.8  MCH 28.0  MCHC 29.0*  RDW 13.4  LYMPHSABS 1.5  MONOABS 0.7  EOSABS 0.1  BASOSABS 0.1   ------------------------------------------------------------------------------------------------------------------  Results for orders placed or performed during the hospital encounter of 01/24/19 (from the past 48 hour(s))  CBC with Differential     Status: Abnormal   Collection Time: 01/24/19  6:17 PM  Result Value Ref Range   WBC 8.4 4.0 - 10.5 K/uL   RBC 4.03 (L) 4.22 - 5.81 MIL/uL   Hemoglobin 11.3 (L) 13.0 - 17.0 g/dL  HCT 39.0 39.0 - 52.0 %   MCV 96.8 80.0 - 100.0 fL   MCH 28.0 26.0 - 34.0 pg   MCHC 29.0 (L) 30.0 - 36.0 g/dL   RDW 13.4 11.5 - 15.5 %   Platelets 138 (L) 150 - 400 K/uL   nRBC 0.0 0.0 - 0.2 %   Neutrophils Relative % 72 %   Neutro Abs 6.1 1.7 - 7.7 K/uL   Lymphocytes  Relative 18 %   Lymphs Abs 1.5 0.7 - 4.0 K/uL   Monocytes Relative 8 %   Monocytes Absolute 0.7 0.1 - 1.0 K/uL   Eosinophils Relative 1 %   Eosinophils Absolute 0.1 0.0 - 0.5 K/uL   Basophils Relative 1 %   Basophils Absolute 0.1 0.0 - 0.1 K/uL   Immature Granulocytes 0 %   Abs Immature Granulocytes 0.03 0.00 - 0.07 K/uL    Comment: Performed at [facility], [address], [place], [place] [address]  Comprehensive metabolic panel     Status: Abnormal   Collection Time: 01/24/19  6:17 PM  Result Value Ref Range   Sodium 141 135 - 145 mmol/L   Potassium 4.4 3.5 - 5.1 mmol/L   Chloride 104 98 - 111 mmol/L   CO2 29 22 - 32 mmol/L   Glucose, Bld 86 70 - 99 mg/dL   BUN 28 (H) 8 - 23 mg/dL   Creatinine, Ser 1.45 (H) 0.61 - 1.24 mg/dL   Calcium 8.9 8.9 - 10.3 mg/dL   Total Protein 6.8 6.5 - 8.1 g/dL   Albumin 3.6 3.5 - 5.0 g/dL   AST 28 15 - 41 U/L   ALT 18 0 - 44 U/L   Alkaline Phosphatase 55 38 - 126 U/L   Total Bilirubin 1.3 (H) 0.3 - 1.2 mg/dL   GFR calc non Af Amer 41 (L) >60 mL/min   GFR calc Af Amer 47 (L) >60 mL/min   Anion gap 8 5 - 15    Comment: Performed at [facility], [address] [place], [place] [address]  Brain natriuretic peptide     Status: Abnormal   Collection Time: 01/24/19  6:18 PM  Result Value Ref Range   B Natriuretic Peptide 697.0 (H) 0.0 - 100.0 pg/mL    Comment: Performed at [facility], [address], [place], [place] [address]    Chemistries  Recent Labs  Lab 01/24/19 1817  NA 141  K 4.4  CL 104  CO2 29  GLUCOSE 86  BUN 28*  CREATININE 1.45*  CALCIUM 8.9  AST 28  ALT 18  ALKPHOS 55  BILITOT 1.3*   ------------------------------------------------------------------------------------------------------------------  ------------------------------------------------------------------------------------------------------------------ GFR: Estimated Creatinine Clearance: 29.2 mL/min (A) (by C-G formula based on SCr of 1.45 mg/dL  (H)). Liver Function Tests: Recent Labs  Lab 01/24/19 1817  AST 28  ALT 18  ALKPHOS 55  BILITOT 1.3*  PROT 6.8  ALBUMIN 3.6   No results for input(s): LIPASE, AMYLASE in the last 168 hours. No results for input(s): AMMONIA in the last 168 hours. Coagulation Profile: No results for input(s): INR, PROTIME in the last 168 hours. Cardiac Enzymes: No results for input(s): CKTOTAL, CKMB, CKMBINDEX, TROPONINI in the last 168 hours. BNP (last 3 results) No results for input(s): PROBNP in the last 8760 hours. HbA1C: No results for input(s): HGBA1C in the last 72 hours. CBG: No results for input(s): GLUCAP in the last 168 hours. Lipid Profile: No results for input(s): CHOL, HDL, LDLCALC, TRIG, CHOLHDL, LDLDIRECT in the last 72  hours. Thyroid Function Tests: No results for input(s): TSH, T4TOTAL, FREET4, T3FREE, THYROIDAB in the last 72 hours. Anemia Panel: No results for input(s): VITAMINB12, FOLATE, FERRITIN, TIBC, IRON, RETICCTPCT in the last 72 hours.  --------------------------------------------------------------------------------------------------------------- Urine analysis:    Component Value Date/Time   COLORURINE STRAW (A) 06/12/2018 1142   APPEARANCEUR CLEAR 06/12/2018 1142   APPEARANCEUR Clear 01/18/2017 1554   LABSPEC 1.006 06/12/2018 1142   PHURINE 6.0 06/12/2018 1142   GLUCOSEU NEGATIVE 06/12/2018 1142   HGBUR NEGATIVE 06/12/2018 1142   BILIRUBINUR NEGATIVE 06/12/2018 1142   BILIRUBINUR Negative 01/18/2017 1554   KETONESUR NEGATIVE 06/12/2018 1142   PROTEINUR NEGATIVE 06/12/2018 1142   UROBILINOGEN 1.0 08/27/2009 0830   NITRITE NEGATIVE 06/12/2018 1142   LEUKOCYTESUR NEGATIVE 06/12/2018 1142      Imaging Results:    Dg Chest Portable 1 View  Result Date: 01/24/2019 CLINICAL DATA:  Leg swelling.  Shortness of breath.  Weight gain. EXAM: PORTABLE CHEST 1 VIEW COMPARISON:  06/12/2018 FINDINGS: Patient rotated left. Mild cardiomegaly. Atherosclerosis in the  transverse aorta. Probable trace right pleural fluid. Thickening of the right minor fissure. Asymmetric pulmonary interstitial prominence, greater on the right. Mild bibasilar airspace disease. IMPRESSION: Mild cardiomegaly with asymmetric mild pulmonary edema, worse on the right. Bibasilar pulmonary opacities, favored to represent atelectasis or scar. No well-defined lobar consolidation. Aortic Atherosclerosis (ICD10-I70.0). Electronically Signed   By: Abigail Miyamoto M.D.   On: 01/24/2019 18:54       Assessment & Plan:    Active Problems:   Essential hypertension   GERD   CAD S/P percutaneous coronary angioplasty   CKD (chronic kidney disease) stage 3, GFR 30-59 ml/min (HCC)   Acute on chronic diastolic CHF (congestive heart failure) (HCC)   Acute CHF (congestive heart failure) (HCC)   Anemia   Thrombocytopenia (HCC)  Dyspnea likely multifactorial Acute on chronic diastolic chf ddx ILD with bronchiectasis , pulmonary hypertension, r/o pulmonary embolism  D dimer, if positive then CTA chest  Acute on chronic diastolic chf Tele Trop I [id number] x2 Check  Cardiac 2D echo Cont metoprolol 12.5mg  po bid Start hydralazine 10mg  po tid Cont Imdur 60mg  po qday Lasix 40mg  iv qday Hydralazine 10mg  iv q6h prn sbp >160  CAD s/p stent Cont aspirin 325mg  po qday Cont Lipitor 80mg  po qhs Cont Metoprolol as above  CKD stage3 Check cmp in am  Anemia/ Thrombocytopenia Check cbc in am  ILD with bronchiectasis, pulmonary hypertension (severe) Please have outpatient f/u with [facility] Consider pulmonary consult if available , otherwise outpatient please   VItamin D def Cont Vitamin D 50,000IU po qweek as outpatient   DVT Prophylaxis-   Lovenox - SCDs   AM Labs Ordered, also please review Full Orders  Family Communication: Admission, patients condition and plan of care including tests being ordered have been discussed with the patient  who indicate understanding and agree with the plan and Code  Status.  Code Status:  FULL CODE per patient, left message for daughter that patient will be admitted to [facility] for evaluation of dyspnea.   Admission status: Inpatient: Based on patients clinical presentation and evaluation of above clinical data, I have made determination that patient meets Inpatient criteria at this time. Pt will require iv lasix, and evaluation of chf, pt has high risk of clinical deterioration due to complex medical history, cad, pulmonary htn, h/o PE, ckd, and therefore close monitoring of diuresis.  pt will require > 2 nites stay.   Time spent in minutes : 70  minutes   Jani Gravel M.D on 01/24/2019 at 8:53 PM

## 2019-01-24 NOTE — ED Triage Notes (Signed)
Pt presents to ED with increased bilateral leg swelling since yesterday. Pt also with 9 lb weight gain.

## 2019-01-24 NOTE — ED Provider Notes (Signed)
[facility] EMERGENCY DEPARTMENT Provider Note   CSN: [id number] Arrival date & time: 01/24/19  1650     History   Chief Complaint Chief Complaint  Patient presents with  . Leg Swelling    HPI Anthony Galvan is a 85 y.o. male.     HPI Patient states he has a history of congestive heart failure.  Patient states he started having bilateral leg swelling yesterday.  He has also noticed a significant weight gain.  Patient states today when he tried to walk around he was having more difficulty because of the heaviness of his legs.  He does have some chronic shortness of breath and is on oxygen but does feel like it is a little bit worse.  He denies any fevers or chills.  No chest pain.  No vomiting or diarrhea. Past Medical History:  Diagnosis Date  . CAD S/P percutaneous coronary angioplasty 08/01/2012   PCI of mid LAD with a VeriFlex Bare Metal Stent 3.0 mm x 12 mm - post-dilated to 3.5 mm.;  Catheterization in July 2018 showed with severe heavily calcified distal circumflex-left PDA stenosis.  Not favorable for PCI.  Marland Kitchen Congestive heart failure (CHF) ([place])   . Duodenal ulcer May 2011   on EGD  . GERD (gastroesophageal reflux disease)   . Helicobacter pylori gastritis MAY 2012 HP STOOL AG NEG  . HTN (hypertension)   . Hyperlipidemia   . MI (myocardial infarction) ([place]) 1976, 1980, [place] for at [facility] and [place] (Dr. Iona Beard)  . Osteoarthritis   . S/P colonoscopy 2011   normal, internal hemorrhoids    Patient Active Problem List   Diagnosis Date Noted  . Interstitial lung disease ([place]) 06/15/2018  . Pulmonary hypertension ([place]) 06/15/2018  . Acute pulmonary embolus ([place]) 06/14/2018  . Acute on chronic diastolic CHF (congestive heart failure) ([place]) 06/12/2018  . Acute respiratory failure with hypoxia ([place]) 06/12/2018  . Fracture 05/07/2018  . Closed displaced intertrochanteric fracture of left femur ([place])   . Coronary artery disease involving native  heart without angina pectoris   . Dyspepsia 01/18/2017  . Closed disp intertrochanteric fracture of right femur with nonunion 11/30/2016  . PUD (peptic ulcer disease)   . History of coronary artery stent placement 10/08/2016  . Acute on chronic renal failure ([place]) 10/08/2016  . CKD (chronic kidney disease) stage 3, GFR 30-59 ml/min (HCC) 10/08/2016  . Bloating 09/03/2013  . CAD S/P percutaneous coronary angioplasty   . MI (myocardial infarction) ([place])   . Presence of bare metal stent in LAD coronary artery - VerifFlex BMS 3.0 mm x 12 mm - post-dilated to 3.5 mm 08/01/2012  . Unstable angina ([place]) 07/30/2012  . Gastritis 12/15/2010  . Nausea 08/30/2010  . Hyperlipidemia with target low density lipoprotein (LDL) cholesterol less than 70 mg/dL 05/21/2006  . CATARACT NOS 05/21/2006  . Essential hypertension 05/21/2006  . MYOCARDIAL INFARCTION, HX OF 05/21/2006  . GERD 05/21/2006  . OVERACTIVE BLADDER 05/21/2006  . OSTEOARTHRITIS 05/21/2006  . URINARY INCONTINENCE 05/21/2006    Past Surgical History:  Procedure Laterality Date  . CARDIAC CATHETERIZATION  01/30/2007   D1 75 %, mid LAD 50%, 50-70% circumflex, 50-70% RCA.(Dr. Adora Fridge)  . CARDIAC CATHETERIZATION  12/11/2001   normal L main, small RCA, dominant LL Cfx with mild diffuse disease, LAD with mid 10-20% stenosis, ramus intermedius with mild diffuse disease (Dr. Jackie Plum)  . CATARACT EXTRACTION    . COLONOSCOPY  OCT 2011 ARS  [place]   in setting of MI  . ESOPHAGOGASTRODUODENOSCOPY N/A 10/09/2016   Procedure: ESOPHAGOGASTRODUODENOSCOPY (EGD);  Surgeon: Danie Binder, MD;  [facility];  Service: Endoscopy;  Laterality: N/A;  . FEMUR IM NAIL Right 12/01/2016   Procedure: INTRAMEDULLARY (IM) NAIL RIGHT HIP;  Surgeon: Altamese [place]ville, MD;  Location: [place];  Service: Orthopedics;  Laterality: Right;  . FEMUR IM NAIL Left 05/07/2018   Procedure: INTRAMEDULLARY (IM) NAIL FEMORAL;   Surgeon: Nicholes Stairs, MD;  Location: [place];  Service: Orthopedics;  Laterality: Left;  . LEFT HEART CATH AND CORONARY ANGIOGRAPHY N/A 10/23/2016   Procedure: Left Heart Cath and Coronary Angiography;  Surgeon: Sherren Mocha, MD;  Location: [place] CV LAB;  Service: Cardiovascular: Two-vessel CAD (left dominant).  Patent BMS in p LAD.  Severe, heavily calcified dCx-LPDA lesion.  Not favorable for PCI.  Marland Kitchen LEFT HEART CATHETERIZATION WITH CORONARY ANGIOGRAM N/A 08/01/2012   Procedure: LEFT HEART CATHETERIZATION WITH CORONARY ANGIOGRAM;  Surgeon: Leonie Man, MD;  Location: [facility] CATH LAB: Mid LAD 99% apple core; D2 ostial 70-80% (2 small for PCI) small nondominant RCA 60-70%. Distal circumflex/L PDA ~50% --> PCI of LAD  . NM MYOCAR PERF WALL MOTION  08/2003   adenosine stress - focal decreased perfusion defect in distal inferior wall, no significant ischemic changes  . PERCUTANEOUS STENT INTERVENTION  08/01/2012   Procedure: PERCUTANEOUS STENT INTERVENTION;  Surgeon: Leonie Man, MD;  Location: [facility] CATH LAB; ;PCI of mid LAD with a VeriFlex Bare Metal Stent 3.0 mm x 12 mm - post-dilated to 3.5 mm.  . TRANSTHORACIC ECHOCARDIOGRAM  10/2016   mild LVH - 60-65%. Gr 1 DD. No RWMA. Mild MR. Mod TR. Mild RA dilation.  PAP ~ 60 mmHg.  Marland Kitchen UPPER GASTROINTESTINAL ENDOSCOPY  08/2009   MELENA, HEMATEMESIS --> DUODENAL ULCER, Bx: H PYLORI POS        Home Medications    Prior to Admission medications   Medication Sig Start Date End Date Taking? Authorizing Provider  albuterol (PROVENTIL) (2.5 MG/3ML) 0.083% nebulizer solution Take 3 mLs (2.5 mg total) by nebulization every 6 (six) hours as needed for wheezing or shortness of breath. 06/15/18  Yes Kathie Dike, MD  aspirin 325 MG tablet Take 1 tablet (325 mg total) by mouth daily. 05/13/18  Yes Mullis, Kiersten P, DO  atorvastatin (LIPITOR) 80 MG tablet Take 1 tablet (80 mg total) by mouth daily at 6 PM. 08/02/12  Yes Hager, Einar Pheasant, PA-C  calcium  carbonate (OS-CAL - DOSED IN MG OF ELEMENTAL CALCIUM) 1250 (500 Ca) MG tablet Take 1 tablet (500 mg of elemental calcium total) by mouth daily with breakfast. 05/13/18  Yes Mullis, Kiersten P, DO  ELIQUIS DVT/PE STARTER PACK (ELIQUIS STARTER PACK) 5 MG TABS Take as directed on package: start with two-5mg  tablets twice daily for 7 days. On day 8, switch to one-5mg  tablet twice daily. 06/15/18  Yes Kathie Dike, MD  furosemide (LASIX) 20 MG tablet Take 1 tablet (20 mg total) by mouth daily. 06/16/18  Yes Kathie Dike, MD  isosorbide mononitrate (IMDUR) 30 MG 24 hr tablet Take 2 tablets (60 mg total) by mouth daily. 06/15/18  Yes Kathie Dike, MD  metoprolol tartrate (LOPRESSOR) 25 MG tablet Take 0.5 tablets (12.5 mg total) by mouth 2 (two) times daily. 05/13/18  Yes Mullis, Kiersten P, DO  Multiple Vitamin (MULTIVITAMIN WITH MINERALS) TABS tablet Take 1 tablet by mouth daily.  Yes [provider]  Vitamin D, Ergocalciferol, (DRISDOL) 1.25 MG (50000 UT) CAPS capsule Take 1 capsule (50,000 Units total) by mouth every 7 (seven) days. 05/16/18  Yes Mullis, Kiersten P, DO  acetaminophen (TYLENOL) 325 MG tablet Take 2 tablets (650 mg total) by mouth 3 (three) times daily as needed (pain). Patient not taking: Reported on 01/24/2019 12/04/16   Ainsley Spinner, PA-C  guaiFENesin (MUCINEX) 600 MG 12 hr tablet Take 1 tablet (600 mg total) by mouth 2 (two) times daily. 06/15/18   Kathie Dike, MD  Nebulizer MISC Please provide 1 nebulizer machine and all necessary supplies. Diagnosis: J84, Office number: [phone] Patient not taking: Reported on 06/17/2018 06/15/18   Kathie Dike, MD  NITROSTAT 0.4 MG SL tablet PLACE 1 TABLET UNDER TONGUE EVERY 5 MINUTES FOR 3 DOSES AS NEEDED. 11/17/13   Leonie Man, MD  polyethylene glycol [facility] / Floria Raveling) packet Take 17 g by mouth 2 (two) times daily. 05/13/18   Mullis, Archie Endo, DO    Family History Family History  Problem Relation Age of Onset  . Heart  disease Mother   . Kidney disease Father   . Heart disease Brother 27  . Heart disease Sister   . Colon cancer Neg Hx     Social History Social History   Tobacco Use  . Smoking status: Never Smoker  . Smokeless tobacco: Never Used  Substance Use Topics  . Alcohol use: No  . Drug use: No     Allergies   Lisinopril   Review of Systems Review of Systems  All other systems reviewed and are negative.    Physical Exam Updated Vital Signs BP (!) 163/82   Pulse 76   Temp 97.7 F (36.5 C) (Oral)   Resp (!) 27   Ht 1.626 m (5\' 4" )   Wt 77 kg   SpO2 100%   BMI 29.15 kg/m   Physical Exam Vitals signs and nursing note reviewed.  Constitutional:      Appearance: He is well-developed. He is not ill-appearing or diaphoretic.  HENT:     Head: Normocephalic and atraumatic.     Right Ear: External ear normal.     Left Ear: External ear normal.  Eyes:     General: No scleral icterus.       Right eye: No discharge.        Left eye: No discharge.     Conjunctiva/sclera: Conjunctivae normal.  Neck:     Musculoskeletal: Neck supple.     Trachea: No tracheal deviation.  Cardiovascular:     Rate and Rhythm: Normal rate and regular rhythm.  Pulmonary:     Effort: Pulmonary effort is normal. No respiratory distress.     Breath sounds: Normal breath sounds. No stridor. No wheezing or rales.  Abdominal:     General: Bowel sounds are normal. There is no distension.     Palpations: Abdomen is soft.     Tenderness: There is no abdominal tenderness. There is no guarding or rebound.  Musculoskeletal:        General: No tenderness.     Right lower leg: Edema present.     Left lower leg: Edema present.     Comments: Pitting edema bilateral lower extremities, no erythema or tenderness  Skin:    General: Skin is warm and dry.     Findings: No rash.  Neurological:     Mental Status: He is alert.     Cranial Nerves: No cranial nerve  deficit (no facial droop, extraocular movements  intact, no slurred speech).     Sensory: No sensory deficit.     Motor: No abnormal muscle tone or seizure activity.     Coordination: Coordination normal.      ED Treatments / Results  Labs (all labs ordered are listed, but only abnormal results are displayed) Labs Reviewed  CBC WITH DIFFERENTIAL/PLATELET - Abnormal; Notable for the following components:      Result Value   RBC 4.03 (*)    Hemoglobin 11.3 (*)    MCHC 29.0 (*)    Platelets 138 (*)    All other components within normal limits  COMPREHENSIVE METABOLIC PANEL - Abnormal; Notable for the following components:   BUN 28 (*)    Creatinine, Ser 1.45 (*)    Total Bilirubin 1.3 (*)    GFR calc non Af Amer 41 (*)    GFR calc Af Amer 47 (*)    All other components within normal limits  BRAIN NATRIURETIC PEPTIDE - Abnormal; Notable for the following components:   B Natriuretic Peptide 697.0 (*)    All other components within normal limits    EKG EKG Interpretation  Date/Time:  Friday January 24 2019 18:36:39 EDT Ventricular Rate:  66 PR Interval:    QRS Duration: 89 QT Interval:  447 QTC Calculation: 469 R Axis:   -53 Text Interpretation:  Sinus rhythm Inferior infarct, old Baseline wander in lead(s) V6 No significant change since last tracing Confirmed by Dorie Rank [phone]) on 01/24/2019 6:39:27 PM   Radiology Dg Chest Portable 1 View  Result Date: 01/24/2019 CLINICAL DATA:  Leg swelling.  Shortness of breath.  Weight gain. EXAM: PORTABLE CHEST 1 VIEW COMPARISON:  06/12/2018 FINDINGS: Patient rotated left. Mild cardiomegaly. Atherosclerosis in the transverse aorta. Probable trace right pleural fluid. Thickening of the right minor fissure. Asymmetric pulmonary interstitial prominence, greater on the right. Mild bibasilar airspace disease. IMPRESSION: Mild cardiomegaly with asymmetric mild pulmonary edema, worse on the right. Bibasilar pulmonary opacities, favored to represent atelectasis or scar. No well-defined lobar  consolidation. Aortic Atherosclerosis (ICD10-I70.0). Electronically Signed   By: Abigail Miyamoto M.D.   On: 01/24/2019 18:54    Procedures .Critical Care Performed by: Dorie Rank, MD Authorized by: Dorie Rank, MD   Critical care provider statement:    Critical care time (minutes):  30   Critical care was time spent personally by me on the following activities:  Discussions with consultants, evaluation of patient's response to treatment, examination of patient, ordering and performing treatments and interventions, ordering and review of laboratory studies, ordering and review of radiographic studies, pulse oximetry, re-evaluation of patient's condition, obtaining history from patient or surrogate and review of old charts   (including critical care time)  Medications Ordered in ED Medications  furosemide (LASIX) injection 40 mg (40 mg Intravenous Given 01/24/19 1830)     Initial Impression / Assessment and Plan / ED Course  I have reviewed the triage vital signs and the nursing notes.  Pertinent labs & imaging results that were available during my care of the patient were reviewed by me and considered in my medical decision making (see chart for details).  Clinical Course as of Jan 23 1946  Fri Jan 24, 2019  1946 Labs are notable for an elevated BNP.  Chest x-ray does show pulmonary edema.   [place] on admission to the hospital for further treatment   [JK]    Clinical Course User Index [JK] Tomi Bamberger,  Wille Glaser, MD     Patient presented to the ED for evaluation of worsening leg swelling.  Exam is consistent with congestive heart failure.   Chest x-ray did show evidence of pulmonary edema and patient does have mild tachypnea.  He was given a dose of Lasix.  Plan admission to the hospital for further treatment and stabilization.  Final Clinical Impressions(s) / ED Diagnoses   Final diagnoses:  Acute on chronic congestive heart failure, unspecified heart failure type [facility])       Dorie Rank, MD 01/26/19 [phone]

## 2019-01-25 ENCOUNTER — Inpatient Hospital Stay (HOSPITAL_COMMUNITY): Payer: Medicare Other

## 2019-01-25 DIAGNOSIS — I272 Pulmonary hypertension, unspecified: Secondary | ICD-10-CM

## 2019-01-25 DIAGNOSIS — J479 Bronchiectasis, uncomplicated: Secondary | ICD-10-CM

## 2019-01-25 DIAGNOSIS — N183 Chronic kidney disease, stage 3 unspecified: Secondary | ICD-10-CM

## 2019-01-25 DIAGNOSIS — D696 Thrombocytopenia, unspecified: Secondary | ICD-10-CM

## 2019-01-25 DIAGNOSIS — I361 Nonrheumatic tricuspid (valve) insufficiency: Secondary | ICD-10-CM

## 2019-01-25 DIAGNOSIS — I5033 Acute on chronic diastolic (congestive) heart failure: Secondary | ICD-10-CM

## 2019-01-25 DIAGNOSIS — I10 Essential (primary) hypertension: Secondary | ICD-10-CM

## 2019-01-25 DIAGNOSIS — J9611 Chronic respiratory failure with hypoxia: Secondary | ICD-10-CM

## 2019-01-25 DIAGNOSIS — E785 Hyperlipidemia, unspecified: Secondary | ICD-10-CM

## 2019-01-25 LAB — SARS CORONAVIRUS 2 (TAT 6-24 HRS): SARS Coronavirus 2: NEGATIVE

## 2019-01-25 LAB — COMPREHENSIVE METABOLIC PANEL
ALT: 16 U/L (ref 0–44)
AST: 23 U/L (ref 15–41)
Albumin: 3.5 g/dL (ref 3.5–5.0)
Alkaline Phosphatase: 52 U/L (ref 38–126)
Anion gap: 8 (ref 5–15)
BUN: 26 mg/dL — ABNORMAL HIGH (ref 8–23)
CO2: 28 mmol/L (ref 22–32)
Calcium: 8.5 mg/dL — ABNORMAL LOW (ref 8.9–10.3)
Chloride: 101 mmol/L (ref 98–111)
Creatinine, Ser: 1.33 mg/dL — ABNORMAL HIGH (ref 0.61–1.24)
GFR calc Af Amer: 53 mL/min — ABNORMAL LOW (ref >60)
GFR calc non Af Amer: 45 mL/min — ABNORMAL LOW (ref >60)
Glucose, Bld: 79 mg/dL (ref 70–99)
Potassium: 3.8 mmol/L (ref 3.5–5.1)
Sodium: 137 mmol/L (ref 135–145)
Total Bilirubin: 1.4 mg/dL — ABNORMAL HIGH (ref 0.3–1.2)
Total Protein: 6.7 g/dL (ref 6.5–8.1)

## 2019-01-25 LAB — CBC
HCT: 37.4 % — ABNORMAL LOW (ref 39.0–52.0)
Hemoglobin: 10.9 g/dL — ABNORMAL LOW (ref 13.0–17.0)
MCH: 27.7 pg (ref 26.0–34.0)
MCHC: 29.1 g/dL — ABNORMAL LOW (ref 30.0–36.0)
MCV: 95.2 fL (ref 80.0–100.0)
Platelets: 156 10*3/uL (ref 150–400)
RBC: 3.93 MIL/uL — ABNORMAL LOW (ref 4.22–5.81)
RDW: 13.2 % (ref 11.5–15.5)
WBC: 8.9 10*3/uL (ref 4.0–10.5)
nRBC: 0 % (ref 0.0–0.2)

## 2019-01-25 LAB — TROPONIN I (HIGH SENSITIVITY): Troponin I (High Sensitivity): 39 ng/L — ABNORMAL HIGH (ref <18)

## 2019-01-25 LAB — ECHOCARDIOGRAM COMPLETE
Height: 64 in
Weight: 2627.88 oz

## 2019-01-25 MED ORDER — HYDRALAZINE HCL 10 MG PO TABS
10.0000 mg | ORAL_TABLET | Freq: Two times a day (BID) | ORAL | Status: DC
  Administered 2019-01-25 – 2019-01-29 (×8): 10 mg via ORAL
  Filled 2019-01-25 (×8): qty 1

## 2019-01-25 MED ORDER — METOPROLOL TARTRATE 25 MG PO TABS
12.5000 mg | ORAL_TABLET | Freq: Two times a day (BID) | ORAL | Status: DC
  Administered 2019-01-25 – 2019-01-29 (×10): 12.5 mg via ORAL
  Filled 2019-01-25 (×10): qty 1

## 2019-01-25 MED ORDER — ALBUTEROL SULFATE (2.5 MG/3ML) 0.083% IN NEBU
2.5000 mg | INHALATION_SOLUTION | Freq: Four times a day (QID) | RESPIRATORY_TRACT | Status: DC | PRN
  Administered 2019-01-25: 2.5 mg via RESPIRATORY_TRACT
  Filled 2019-01-25: qty 3

## 2019-01-25 MED ORDER — POLYETHYLENE GLYCOL 3350 17 G PO PACK
17.0000 g | PACK | Freq: Two times a day (BID) | ORAL | Status: DC
  Administered 2019-01-25 – 2019-01-29 (×9): 17 g via ORAL
  Filled 2019-01-25 (×9): qty 1

## 2019-01-25 MED ORDER — GUAIFENESIN ER 600 MG PO TB12
600.0000 mg | ORAL_TABLET | Freq: Two times a day (BID) | ORAL | Status: DC
  Administered 2019-01-25 – 2019-01-29 (×10): 600 mg via ORAL
  Filled 2019-01-25 (×11): qty 1

## 2019-01-25 MED ORDER — IPRATROPIUM-ALBUTEROL 0.5-2.5 (3) MG/3ML IN SOLN
3.0000 mL | Freq: Three times a day (TID) | RESPIRATORY_TRACT | Status: DC
  Administered 2019-01-26 – 2019-01-29 (×12): 3 mL via RESPIRATORY_TRACT
  Filled 2019-01-25 (×12): qty 3

## 2019-01-25 MED ORDER — ATORVASTATIN CALCIUM 40 MG PO TABS
80.0000 mg | ORAL_TABLET | Freq: Every day | ORAL | Status: DC
  Administered 2019-01-25 – 2019-01-29 (×5): 80 mg via ORAL
  Filled 2019-01-25 (×6): qty 2

## 2019-01-25 MED ORDER — ASPIRIN 325 MG PO TABS
325.0000 mg | ORAL_TABLET | Freq: Every day | ORAL | Status: DC
  Administered 2019-01-25 – 2019-01-29 (×5): 325 mg via ORAL
  Filled 2019-01-25 (×5): qty 1

## 2019-01-25 MED ORDER — IPRATROPIUM-ALBUTEROL 0.5-2.5 (3) MG/3ML IN SOLN
3.0000 mL | Freq: Three times a day (TID) | RESPIRATORY_TRACT | Status: DC
  Administered 2019-01-25 (×2): 3 mL via RESPIRATORY_TRACT
  Filled 2019-01-25 (×2): qty 3

## 2019-01-25 MED ORDER — ORAL CARE MOUTH RINSE
15.0000 mL | Freq: Two times a day (BID) | OROMUCOSAL | Status: DC
  Administered 2019-01-25 – 2019-01-29 (×9): 15 mL via OROMUCOSAL

## 2019-01-25 MED ORDER — ALUM & MAG HYDROXIDE-SIMETH 200-200-20 MG/5ML PO SUSP
30.0000 mL | ORAL | Status: DC | PRN
  Filled 2019-01-25: qty 30

## 2019-01-25 MED ORDER — ISOSORBIDE MONONITRATE ER 60 MG PO TB24
60.0000 mg | ORAL_TABLET | Freq: Every day | ORAL | Status: DC
  Administered 2019-01-25 – 2019-01-27 (×3): 60 mg via ORAL
  Filled 2019-01-25 (×4): qty 1

## 2019-01-25 NOTE — Progress Notes (Signed)
PROGRESS NOTE  Anthony Galvan [id number] DOB: December 01, 1921 DOA: 01/24/2019 PCP: [name], MD  Brief History:  85 year old male with a history of coronary artery disease (s/p BMS to LAD in 2014, cath in 10/2016 showing patent stent with 80% distal-LCx stenosis which was unfavorable for PCI and medical management was recommended), hypertension, hyperlipidemia, pulmonary embolus February 2020, bronchiectasis/interstitial lung disease, CKD stage III, pulmonary hypertension, chronic respiratory failure on 3 L presenting with 2-day history of worsening shortness of breath and 1 day of increasing lower extremity edema.  The patient states that he had gained 9 pounds over 1 day period with his weight of 169 pounds on 01/24/2019.  He had denied any fevers, chills, nausea, vomiting, diarrhea, cough, hemoptysis, abdominal pain, dysuria, hematuria.  He endorses compliance with his medications.  He does endorse some indiscretion with fluid intake, but has been compliant with his dietary restrictions. In the emergency department, the patient was afebrile hemodynamically stable saturating 98% on 3 L nasal cannula.  BNP 697, troponin 41>>>39.  BMP showed serum creatinine 1.45 with unremarkable LFTs.  CBC showed hemoglobin of 10.9.  Patient was admitted for diuresis and further work-up.  Assessment/Plan: Acute on chronic diastolic CHF -99991111 echo EF 60-65%, impaired relaxation, mild decreased RV function, elevated RVSP 60.2 mmHg -Primarily cor pulmonale/right heart failure -Continue IV furosemide -Remains clinically fluid overloaded -Daily weights -Accurate I's and O's -Repeat echo -personally reviewed EKG--sinus, no STT changes  Chronic respiratory failure with hypoxia -The patient's dyspnea is multifactorial including CHF superimposed upon the patient's ILD/bronchiectasis -Presently stable on 3 L which is the patient's home requirement -01/25/2019 CTA chest--negative PE, basilar  predominant fibrotic changes and bronchiectasis, no pleural effusion or consolidation, right heart enlargement with reflux of contrast into the hepatic veins  CKD stage III -Baseline creatinine 1.1-1.4 -Monitor with diuresis -A.m. BMP  Interstitial lung disease/bronchiectasis -Start duo nebs -Patient follows with pulmonology at [facility]  History of pulmonary embolus -Noted on CTA chest 06/13/2018--> started on apixaban -Was instructed to discontinue his apixaban 2 weeks prior to this admission -01/25/2019 CTA chest negative for PE  Coronary artery disease -(s/p BMS to LAD in 2014, cath in 10/2016 showing patent stent with 80% distal-LCx stenosis which was unfavorable for PCI  -continue ASA  Thrombocytopenia -Secondary to chronic hepatic congestion -Serum 123456 -Folic acid  Essential hypertension -Continue metoprolol and Imdur  Hyperlipidemia -Continue statin     Disposition Plan:   Home in 2-3 days  Family Communication:   Family at bedside  Consultants:  none  Code Status:  FULL   DVT Prophylaxis:   [place] Lovenox   Procedures: As Listed in Progress Note Above  Antibiotics: None      Subjective: Patient states that his breathing is better than yesterday but he remains short of breath with minimal exertion.  He denies any fevers, chills, chest pain, nausea, vomiting, diarrhea, abdominal pain, dysuria, hematuria.  Objective: Vitals:   01/25/19 0053 01/25/19 0304 01/25/19 0600 01/25/19 0754  BP:   132/73   Pulse: 72  72   Resp:   20   Temp:   97.8 F (36.6 C)   TempSrc:   Oral   SpO2:  98% 100% 99%  Weight:      Height:        Intake/Output Summary (Last 24 hours) at 01/25/2019 0953 Last data filed at 01/25/2019 0806 Gross per 24 hour  Intake -  Output 1875 ml  Net -  1875 ml   Weight change:  Exam:   General:  Pt is alert, follows commands appropriately, not in acute distress  HEENT: No icterus, No thrush, No neck mass,  [place]/AT  Cardiovascular: RRR, S1/S2, no rubs, no gallops +JVD  Respiratory: Bilateral crackles, recurrent left.  No wheezing.  Abdomen: Soft/+BS, non tender, non distended, no guarding  Extremities: 2 + LE edema, No lymphangitis, No petechiae, No rashes, no synovitis   Data Reviewed: I have personally reviewed following labs and imaging studies Basic Metabolic Panel: Recent Labs  Lab 01/24/19 1817 01/25/19 0352  NA 141 137  K 4.4 3.8  CL 104 101  CO2 29 28  GLUCOSE 86 79  BUN 28* 26*  CREATININE 1.45* 1.33*  CALCIUM 8.9 8.5*   Liver Function Tests: Recent Labs  Lab 01/24/19 1817 01/25/19 0352  AST 28 23  ALT 18 16  ALKPHOS 55 52  BILITOT 1.3* 1.4*  PROT 6.8 6.7  ALBUMIN 3.6 3.5   No results for input(s): LIPASE, AMYLASE in the last 168 hours. No results for input(s): AMMONIA in the last 168 hours. Coagulation Profile: No results for input(s): INR, PROTIME in the last 168 hours. CBC: Recent Labs  Lab 01/24/19 1817 01/25/19 0352  WBC 8.4 8.9  NEUTROABS 6.1  --   HGB 11.3* 10.9*  HCT 39.0 37.4*  MCV 96.8 95.2  PLT 138* 156   Cardiac Enzymes: No results for input(s): CKTOTAL, CKMB, CKMBINDEX, TROPONINI in the last 168 hours. BNP: Invalid input(s): POCBNP CBG: No results for input(s): GLUCAP in the last 168 hours. HbA1C: No results for input(s): HGBA1C in the last 72 hours. Urine analysis:    Component Value Date/Time   COLORURINE STRAW (A) 06/12/2018 1142   APPEARANCEUR CLEAR 06/12/2018 1142   APPEARANCEUR Clear 01/18/2017 1554   LABSPEC 1.006 06/12/2018 1142   PHURINE 6.0 06/12/2018 1142   GLUCOSEU NEGATIVE 06/12/2018 1142   HGBUR NEGATIVE 06/12/2018 1142   BILIRUBINUR NEGATIVE 06/12/2018 1142   BILIRUBINUR Negative 01/18/2017 1554   KETONESUR NEGATIVE 06/12/2018 1142   PROTEINUR NEGATIVE 06/12/2018 1142   UROBILINOGEN 1.0 08/27/2009 0830   NITRITE NEGATIVE 06/12/2018 1142   LEUKOCYTESUR NEGATIVE 06/12/2018 1142   Sepsis  Labs: @LABRCNTIP (procalcitonin:4,lacticidven:4) )No results found for this or any previous visit (from the past 240 hour(s)).   Scheduled Meds: . aspirin  325 mg Oral Daily  . atorvastatin  80 mg Oral q1800  . enoxaparin (LOVENOX) injection  40 mg Subcutaneous Daily  . furosemide  40 mg Intravenous Daily  . guaiFENesin  600 mg Oral BID  . hydrALAZINE  10 mg Oral TID  . influenza vaccine adjuvanted  0.5 mL Intramuscular Tomorrow-1000  . isosorbide mononitrate  60 mg Oral Daily  . mouth rinse  15 mL Mouth Rinse BID  . metoprolol tartrate  12.5 mg Oral BID  . pneumococcal 23 valent vaccine  0.5 mL Intramuscular Tomorrow-1000  . polyethylene glycol  17 g Oral BID  . sodium chloride flush  3 mL Intravenous Q12H   Continuous Infusions: . sodium chloride      Procedures/Studies: Ct Angio Chest Pe W Or Wo Contrast  Result Date: 01/25/2019 CLINICAL DATA:  PE suspected, positive D-dimer, history of PE recently stopping Eliquis EXAM: CT ANGIOGRAPHY CHEST WITH CONTRAST TECHNIQUE: Multidetector CT imaging of the chest was performed using the standard protocol during bolus administration of intravenous contrast. Multiplanar CT image reconstructions and MIPs were obtained to evaluate the vascular anatomy. CONTRAST:  153mL OMNIPAQUE IOHEXOL 350 MG/ML SOLN COMPARISON:  Same day chest radiograph, CTA chest 06/13/2018 FINDINGS: Cardiovascular: Satisfactory opacification of the pulmonary arteries to the segmental level. No evidence of acute or residual pulmonary embolus. There is central pulmonary arterial enlargement. There is cardiomegaly with right heart enlargement and reflux of contrast into the hepatic veins. Atherosclerotic calcification of the coronary arteries. Calcifications are present upon the aortic valve leaflets. Atherosclerotic plaque within the normal caliber aorta. Normal branching of the aortic arch. Mediastinum/Nodes: Few prominent lymph nodes are again seen within the mediastinum and hila  including a 9 mm left hilar node (6/38), 8 mm AP window lymph node (6/33), and 9 mm right paratracheal lymph node (6/30). Each of these nodes is similar to the comparison exam. No enlarging lymph nodes are evident. No acute tracheal or esophageal abnormality. Normal appearance of the thyroid gland and thoracic inlet. Lungs/Pleura: There is redemonstration of the basilar predominant fibrotic change and bronchiectasis. Question early development of stacked cyst/honeycomb cyst formation. Few scattered calcified granulomata are present. No acute superimposed consolidative process. No pneumothorax or effusion. Upper Abdomen: 2.2 cm upper pole left renal cyst is noted. Upper abdomen is otherwise unremarkable. Musculoskeletal: No chest wall abnormality. Poor dentition with multiple carious lesions and periapical lucencies partially imaged on this exam. Review of the MIP images confirms the above findings. IMPRESSION: 1. No evidence of acute or residual pulmonary embolus. 2. Cardiomegaly with right heart enlargement and reflux of contrast into the hepatic veins, suggestive of right heart failure. Coronary atherosclerosis. 3. Basilar predominant fibrotic change and bronchiectasis, consistent with interstitial lung disease, findings are categorized as probable UIP per consensus guidelines: Diagnosis of Idiopathic Pulmonary Fibrosis: An Official ATS/ERS/JRS/ALAT Clinical Practice Guideline. [place], Iss 5, [phone], Dec 16 2016. 4. Poor dentition with multiple carious lesions and periapical lucencies partially imaged on this exam. Correlate with dental examination. 5. Aortic Atherosclerosis (ICD10-I70.0). Electronically Signed   By: Lovena Le M.D.   On: 01/25/2019 02:08   Dg Chest Portable 1 View  Result Date: 01/24/2019 CLINICAL DATA:  Leg swelling.  Shortness of breath.  Weight gain. EXAM: PORTABLE CHEST 1 VIEW COMPARISON:  06/12/2018 FINDINGS: Patient rotated left. Mild cardiomegaly.  Atherosclerosis in the transverse aorta. Probable trace right pleural fluid. Thickening of the right minor fissure. Asymmetric pulmonary interstitial prominence, greater on the right. Mild bibasilar airspace disease. IMPRESSION: Mild cardiomegaly with asymmetric mild pulmonary edema, worse on the right. Bibasilar pulmonary opacities, favored to represent atelectasis or scar. No well-defined lobar consolidation. Aortic Atherosclerosis (ICD10-I70.0). Electronically Signed   By: Abigail Miyamoto M.D.   On: 01/24/2019 18:54    Orson Eva, DO  [facility] Pager [phone]  If 7PM-7AM, please contact night-coverage [email or web address] Password [email or web address] 01/25/2019, 9:53 AM   LOS: 1 day

## 2019-01-25 NOTE — Progress Notes (Signed)
*  PRELIMINARY RESULTS* Echocardiogram 2D Echocardiogram has been performed.  Leavy Cella 01/25/2019, 9:43 AM

## 2019-01-26 DIAGNOSIS — I2781 Cor pulmonale (chronic): Secondary | ICD-10-CM

## 2019-01-26 DIAGNOSIS — J9611 Chronic respiratory failure with hypoxia: Secondary | ICD-10-CM

## 2019-01-26 LAB — BASIC METABOLIC PANEL
Anion gap: 11 (ref 5–15)
BUN: 25 mg/dL — ABNORMAL HIGH (ref 8–23)
CO2: 30 mmol/L (ref 22–32)
Calcium: 8.5 mg/dL — ABNORMAL LOW (ref 8.9–10.3)
Chloride: 98 mmol/L (ref 98–111)
Creatinine, Ser: 1.4 mg/dL — ABNORMAL HIGH (ref 0.61–1.24)
GFR calc Af Amer: 49 mL/min — ABNORMAL LOW (ref >60)
GFR calc non Af Amer: 43 mL/min — ABNORMAL LOW (ref >60)
Glucose, Bld: 81 mg/dL (ref 70–99)
Potassium: 4.3 mmol/L (ref 3.5–5.1)
Sodium: 139 mmol/L (ref 135–145)

## 2019-01-26 LAB — MAGNESIUM: Magnesium: 2.1 mg/dL (ref 1.7–2.4)

## 2019-01-26 LAB — VITAMIN B12: Vitamin B-12: 348 pg/mL (ref 180–914)

## 2019-01-26 LAB — TROPONIN I (HIGH SENSITIVITY)
Troponin I (High Sensitivity): 30 ng/L — ABNORMAL HIGH (ref <18)
Troponin I (High Sensitivity): 29 ng/L — ABNORMAL HIGH (ref <18)

## 2019-01-26 LAB — FOLATE: Folate: 13.2 ng/mL (ref >5.9)

## 2019-01-26 MED ORDER — ENOXAPARIN SODIUM 30 MG/0.3ML ~~LOC~~ SOLN
30.0000 mg | Freq: Every day | SUBCUTANEOUS | Status: DC
  Administered 2019-01-27 – 2019-01-29 (×3): 30 mg via SUBCUTANEOUS
  Filled 2019-01-26 (×3): qty 0.3

## 2019-01-26 NOTE — Progress Notes (Signed)
PROGRESS NOTE  Anthony Galvan [id number] DOB: Jun 27, 1921 DOA: 01/24/2019 PCP: [name], MD  Brief History:  85 year old male with a history of coronary artery disease (s/p BMS to LAD in 2014, cath in 10/2016 showing patent stent with 80% distal-LCx stenosis which was unfavorable for PCI and medical management was recommended), hypertension, hyperlipidemia, pulmonary embolus February 2020, bronchiectasis/interstitial lung disease, CKD stage III, pulmonary hypertension, chronic respiratory failure on 3 L presenting with 2-day history of worsening shortness of breath and 1 day of increasing lower extremity edema.  The patient states that he had gained 9 pounds over 1 day period with his weight of 169 pounds on 01/24/2019.  He had denied any fevers, chills, nausea, vomiting, diarrhea, cough, hemoptysis, abdominal pain, dysuria, hematuria.  He endorses compliance with his medications.  He does endorse some indiscretion with fluid intake, but has been compliant with his dietary restrictions. In the emergency department, the patient was afebrile hemodynamically stable saturating 98% on 3 L nasal cannula.  BNP 697, troponin 41>>>39.  BMP showed serum creatinine 1.45 with unremarkable LFTs.  CBC showed hemoglobin of 10.9.  Patient was admitted for diuresis and further work-up.  Assessment/Plan: Acute on chronic diastolic CHF/Cor Pulmonale -06/12/2018 echo EF 60-65%, impaired relaxation, mild decreased RV function, elevated RVSP 60.2 mmHg -Primarily cor pulmonale/right heart failure -Continue IV furosemide -Remains clinically fluid overloaded -Daily weights--NEG 2.4 lbs -Accurate I's and O's--NEG 2 L -Repeat echo--EF 65-70%, mod-severe TR, RVSP 53.3 -personally reviewed EKG--sinus, no STT changes -may ultimately need Right heart cath  Chronic respiratory failure with hypoxia -The patient's dyspnea is multifactorial including CHF superimposed upon the patient's  ILD/bronchiectasis -Presently stable on 3 L which is the patient's home requirement -01/25/2019 CTA chest--negative PE, basilar predominant fibrotic changes and bronchiectasis, no pleural effusion or consolidation, right heart enlargement with reflux of contrast into the hepatic veins  CKD stage III -Baseline creatinine 1.1-1.4 -Monitor with diuresis -A.m. BMP  Interstitial lung disease/bronchiectasis -continue duo nebs -Patient [facility] with [facility]ology at [facility]  History of pulmonary embolus -Noted on CTA chest 06/13/2018--> started on apixaban -Was instructed to discontinue his apixaban 2 weeks prior to this admission -01/25/2019 CTA chest negative for PE  Coronary artery disease -(s/p BMS to LAD in 2014, cath in 10/2016 showing patent stent with 80% distal-LCx stenosis which was unfavorable for PCI  -continue ASA  Thrombocytopenia -Secondary to chronic hepatic congestion -Serum [id number] -Folic [id number]  Essential hypertension -Continue metoprolol and Imdur  Hyperlipidemia -Continue statin  Elevated troponin -trend is flat -due to demand ischemia -cycle again as pt had chest discomfort today -repeat EKG     Disposition Plan:   Home in 1-2 days  Family Communication:   Family at bedside  Consultants:  none  Code Status:  FULL   DVT Prophylaxis:   [place] Lovenox   Procedures: As Listed in Progress Note Above  Antibiotics: None   Subjective: Pt had some chest discomfort this pm while sitting up.  Denies f/c, sob, n/v/d, abd pain.  Breathing is better than yesterday.  No hemopytisis, hematochezia, melena  Objective: Vitals:   01/26/19 0500 01/26/19 0754 01/26/19 1312 01/26/19 1328  BP:    120/62  Pulse:    76  Resp:    18  Temp:    97.8 F (36.6 C)  TempSrc:    Oral  SpO2:  99% 95% 95%  Weight: 73.4 kg     Height:  Intake/Output Summary (Last 24 hours) at 01/26/2019 1540 Last data filed at 01/26/2019 1338 Gross per 24  hour  Intake 1083 ml  Output 1850 ml  Net -767 ml   Weight change: -3.621 kg Exam:   General:  Pt is alert, follows commands appropriately, not in acute distress  HEENT: No icterus, No thrush, No neck mass, [place]/AT  Cardiovascular: RRR, S1/S2, no rubs, no gallops  Respiratory: bibasilar rales. No wheeze.  Abdomen: Soft/+BS, non tender, non distended, no guarding  Extremities: 1+ LE edema, No lymphangitis, No petechiae, No rashes, no synovitis   Data Reviewed: I have personally reviewed following labs and imaging studies Basic Metabolic Panel: Recent Labs  Lab 01/24/19 1817 01/25/19 0352 01/26/19 0601  NA 141 137 139  K 4.4 3.8 4.3  CL 104 101 98  CO2 29 28 30   GLUCOSE 86 79 81  BUN 28* 26* 25*  CREATININE 1.45* 1.33* 1.40*  CALCIUM 8.9 8.5* 8.5*  MG  --   --  2.1   Liver Function Tests: Recent Labs  Lab 01/24/19 1817 01/25/19 0352  AST 28 23  ALT 18 16  ALKPHOS 55 52  BILITOT 1.3* 1.4*  PROT 6.8 6.7  ALBUMIN 3.6 3.5   No results for input(s): LIPASE, AMYLASE in the last 168 hours. No results for input(s): AMMONIA in the last 168 hours. Coagulation Profile: No results for input(s): INR, PROTIME in the last 168 hours. CBC: Recent Labs  Lab 01/24/19 1817 01/25/19 0352  WBC 8.4 8.9  NEUTROABS 6.1  --   HGB 11.3* 10.9*  HCT 39.0 37.4*  MCV 96.8 95.2  PLT 138* 156   Cardiac Enzymes: No results for input(s): CKTOTAL, CKMB, CKMBINDEX, TROPONINI in the last 168 hours. BNP: Invalid input(s): POCBNP CBG: No results for input(s): GLUCAP in the last 168 hours. HbA1C: No results for input(s): HGBA1C in the last 72 hours. Urine analysis:    Component Value Date/Time   COLORURINE STRAW (A) 06/12/2018 1142   APPEARANCEUR CLEAR 06/12/2018 1142   APPEARANCEUR Clear 01/18/2017 1554   LABSPEC 1.006 06/12/2018 1142   PHURINE 6.0 06/12/2018 1142   GLUCOSEU NEGATIVE 06/12/2018 1142   HGBUR NEGATIVE 06/12/2018 1142   BILIRUBINUR NEGATIVE 06/12/2018 1142    BILIRUBINUR Negative 01/18/2017 1554   KETONESUR NEGATIVE 06/12/2018 1142   PROTEINUR NEGATIVE 06/12/2018 1142   UROBILINOGEN 1.0 08/27/2009 0830   NITRITE NEGATIVE 06/12/2018 1142   LEUKOCYTESUR NEGATIVE 06/12/2018 1142   Sepsis Labs: @LABRCNTIP (procalcitonin:4,lacticidven:4) ) Recent Results (from the past 240 hour(s))  SARS CORONAVIRUS 2 (Aletheia Tangredi 6-24 HRS) Nasopharyngeal Nasopharyngeal Swab     Status: None   Collection Time: 01/24/19  6:54 PM   Specimen: Nasopharyngeal Swab  Result Value Ref Range Status   SARS Coronavirus 2 NEGATIVE NEGATIVE Final    Comment: (NOTE) SARS-CoV-2 target nucleic acids are NOT DETECTED. The SARS-CoV-2 RNA is generally detectable in upper and lower respiratory specimens during the acute phase of infection. Negative results do not preclude SARS-CoV-2 infection, do not rule out co-infections with other pathogens, and should not be used as the sole basis for treatment or other patient management decisions. Negative results must be combined with clinical observations, patient history, and epidemiological information. The expected result is Negative. Fact Sheet for Patients: SugarRoll.be Fact Sheet for Healthcare Providers: https://www.woods-mathews.com/ This test is not yet approved or cleared by the [place] FDA and  has been authorized for detection and/or diagnosis of SARS-CoV-2 by FDA under an Emergency Use Authorization (EUA). This EUA will remain  in effect (meaning this test can be used) for the duration of the COVID-19 declaration under Section 56 4(b)(1) of the Act, 21 U.S.C. section 360bbb-3(b)(1), unless the authorization is terminated or revoked sooner. Performed at [facility], [place] [address], [place], [place] [address]      Scheduled Meds: . aspirin  325 mg Oral Daily  . atorvastatin  80 mg Oral q1800  . [START ON 01/27/2019] enoxaparin (LOVENOX) injection  30 mg Subcutaneous Daily   . furosemide  40 mg Intravenous Daily  . guaiFENesin  600 mg Oral BID  . hydrALAZINE  10 mg Oral BID  . ipratropium-albuterol  3 mL Nebulization TID  . isosorbide mononitrate  60 mg Oral Daily  . mouth rinse  15 mL Mouth Rinse BID  . metoprolol tartrate  12.5 mg Oral BID  . polyethylene glycol  17 g Oral BID  . sodium chloride flush  3 mL Intravenous Q12H   Continuous Infusions: . sodium chloride      Procedures/Studies: Ct Angio Chest Pe W Or Wo Contrast  Result Date: 01/25/2019 CLINICAL DATA:  PE suspected, positive D-dimer, history of PE recently stopping Eliquis EXAM: CT ANGIOGRAPHY CHEST WITH CONTRAST TECHNIQUE: Multidetector CT imaging of the chest was performed using the standard protocol during bolus administration of intravenous contrast. Multiplanar CT image reconstructions and MIPs were obtained to evaluate the vascular anatomy. CONTRAST:  18mL OMNIPAQUE IOHEXOL 350 MG/ML SOLN COMPARISON:  Same day chest radiograph, CTA chest 06/13/2018 FINDINGS: Cardiovascular: Satisfactory opacification of the pulmonary arteries to the segmental level. No evidence of acute or residual pulmonary embolus. There is central pulmonary arterial enlargement. There is cardiomegaly with right heart enlargement and reflux of contrast into the hepatic veins. Atherosclerotic calcification of the coronary arteries. Calcifications are present upon the aortic valve leaflets. Atherosclerotic plaque within the normal caliber aorta. Normal branching of the aortic arch. Mediastinum/Nodes: Few prominent lymph nodes are again seen within the mediastinum and hila including a 9 mm left hilar node (6/38), 8 mm AP window lymph node (6/33), and 9 mm right paratracheal lymph node (6/30). Each of these nodes is similar to the comparison exam. No enlarging lymph nodes are evident. No acute tracheal or esophageal abnormality. Normal appearance of the thyroid gland and thoracic inlet. Lungs/Pleura: There is redemonstration of the  basilar predominant fibrotic change and bronchiectasis. Question early development of stacked cyst/honeycomb cyst formation. Few scattered calcified granulomata are present. No acute superimposed consolidative process. No pneumothorax or effusion. Upper Abdomen: 2.2 cm upper pole left renal cyst is noted. Upper abdomen is otherwise unremarkable. Musculoskeletal: No chest wall abnormality. Poor dentition with multiple carious lesions and periapical lucencies partially imaged on this exam. Review of the MIP images confirms the above findings. IMPRESSION: 1. No evidence of acute or residual pulmonary embolus. 2. Cardiomegaly with right heart enlargement and reflux of contrast into the hepatic veins, suggestive of right heart failure. Coronary atherosclerosis. 3. Basilar predominant fibrotic change and bronchiectasis, consistent with interstitial lung disease, findings are categorized as probable UIP per consensus guidelines: Diagnosis of Idiopathic Pulmonary Fibrosis: An Official ATS/ERS/JRS/ALAT Clinical Practice Guideline. [place], Iss 5, [phone], Dec 16 2016. 4. Poor dentition with multiple carious lesions and periapical lucencies partially imaged on this exam. Correlate with dental examination. 5. Aortic Atherosclerosis (ICD10-I70.0). Electronically Signed   By: Lovena Le M.D.   On: 01/25/2019 02:08   Dg Chest Portable 1 View  Result Date: 01/24/2019 CLINICAL DATA:  Leg swelling.  Shortness of breath.  Weight gain. EXAM: PORTABLE CHEST 1 VIEW COMPARISON:  06/12/2018 FINDINGS: Patient rotated left. Mild cardiomegaly. Atherosclerosis in the transverse aorta. Probable trace right pleural fluid. Thickening of the right minor fissure. Asymmetric pulmonary interstitial prominence, greater on the right. Mild bibasilar airspace disease. IMPRESSION: Mild cardiomegaly with asymmetric mild pulmonary edema, worse on the right. Bibasilar pulmonary opacities, favored to represent atelectasis or  scar. No well-defined lobar consolidation. Aortic Atherosclerosis (ICD10-I70.0). Electronically Signed   By: Abigail Miyamoto M.D.   On: 01/24/2019 18:54    Orson Eva, DO  [facility] Pager [phone]  If 7PM-7AM, please contact night-coverage [email or web address] Password [email or web address] 01/26/2019, 3:40 PM   LOS: 2 days

## 2019-01-27 ENCOUNTER — Encounter (HOSPITAL_COMMUNITY): Payer: Self-pay | Admitting: Radiology

## 2019-01-27 ENCOUNTER — Inpatient Hospital Stay (HOSPITAL_COMMUNITY): Payer: Medicare Other

## 2019-01-27 LAB — RESPIRATORY PANEL BY PCR

## 2019-01-27 LAB — BASIC METABOLIC PANEL
Anion gap: 11 (ref 5–15)
BUN: 26 mg/dL — ABNORMAL HIGH (ref 8–23)
CO2: 29 mmol/L (ref 22–32)
Calcium: 8.5 mg/dL — ABNORMAL LOW (ref 8.9–10.3)
Chloride: 97 mmol/L — ABNORMAL LOW (ref 98–111)
Creatinine, Ser: 1.51 mg/dL — ABNORMAL HIGH (ref 0.61–1.24)
GFR calc Af Amer: 45 mL/min — ABNORMAL LOW (ref >60)
GFR calc non Af Amer: 39 mL/min — ABNORMAL LOW (ref >60)
Glucose, Bld: 86 mg/dL (ref 70–99)
Potassium: 4.4 mmol/L (ref 3.5–5.1)
Sodium: 137 mmol/L (ref 135–145)

## 2019-01-27 MED ORDER — ALUM & MAG HYDROXIDE-SIMETH 200-200-20 MG/5ML PO SUSP
30.0000 mL | Freq: Once | ORAL | Status: AC
  Administered 2019-01-27: 11:00:00 30 mL via ORAL
  Filled 2019-01-27: qty 30

## 2019-01-27 MED ORDER — LIDOCAINE VISCOUS HCL 2 % MT SOLN
15.0000 mL | Freq: Once | OROMUCOSAL | Status: AC
  Administered 2019-01-27: 23:00:00 15 mL via ORAL
  Filled 2019-01-27: qty 15

## 2019-01-27 MED ORDER — ALUM & MAG HYDROXIDE-SIMETH 200-200-20 MG/5ML PO SUSP
30.0000 mL | Freq: Once | ORAL | Status: AC
  Administered 2019-01-27: 23:00:00 30 mL via ORAL
  Filled 2019-01-27: qty 30

## 2019-01-27 MED ORDER — LIDOCAINE VISCOUS HCL 2 % MT SOLN
15.0000 mL | Freq: Once | OROMUCOSAL | Status: AC
  Administered 2019-01-27: 15 mL via ORAL
  Filled 2019-01-27: qty 15

## 2019-01-27 MED ORDER — FUROSEMIDE 40 MG PO TABS
40.0000 mg | ORAL_TABLET | Freq: Every day | ORAL | Status: DC
  Administered 2019-01-28 – 2019-01-29 (×2): 40 mg via ORAL
  Filled 2019-01-27 (×2): qty 1

## 2019-01-27 MED ORDER — BUDESONIDE 0.5 MG/2ML IN SUSP
0.5000 mg | Freq: Two times a day (BID) | RESPIRATORY_TRACT | Status: DC
  Administered 2019-01-27 – 2019-01-29 (×5): 0.5 mg via RESPIRATORY_TRACT
  Filled 2019-01-27 (×5): qty 2

## 2019-01-27 NOTE — Progress Notes (Signed)
PROGRESS NOTE  Anthony Galvan [id number] DOB: 1921/08/23 DOA: 01/24/2019 PCP: [name], MD  Brief History: 85 year old male with a history of coronary artery disease(s/p BMS to LAD in 2014, cath in 10/2016 showing patent stent with 80% distal-LCx stenosis which was unfavorable for PCI and medical management was recommended),hypertension, hyperlipidemia, pulmonary embolus February 2020, bronchiectasis/interstitial lung disease, CKD stage III, pulmonary hypertension, chronic respiratory failure on 3 L presenting with 2-day history of worsening shortness of breath and 1 day of increasing lower extremity edema. The patient states that he had gained 9 pounds over 1 day periodwith his weight of 169 pounds on 01/24/2019. He had denied any fevers, chills, nausea, vomiting, diarrhea, cough, hemoptysis, abdominal pain, dysuria, hematuria. He endorses compliance with his medications. He does endorse some indiscretion with fluid intake, but has been compliant with his dietary restrictions. In the emergency department, the patient was afebrile hemodynamically stable saturating 98% on 3 L nasal cannula. [id number], troponin 41>>>39.BMP showed serum creatinine 1.45 with unremarkable LFTs. CBC showed hemoglobin of 10.9. Patient was admitted for diuresis and further work-up.  Assessment/Plan: Acute on chronic diastolic CHF/Cor Pulmonale -06/12/2018 echo EF 60-65%, impaired relaxation, mild decreased RV function, elevated RVSP 60.2 mmHg -Primarily cor pulmonale/right heart failure -Continue IV furosemide -Remains clinically fluid overloaded -Daily weights--NEG 2.4 lbs -Accurate I's and O's--NEG 3.2 L -Repeat echo--EF 65-70%, mod-severe TR, RVSP 53.3 -personally reviewed EKG--sinus, no STT changes -may ultimately need Right heart cath  Chronic respiratory failure with hypoxia -The patient's dyspnea is multifactorial including CHF superimposed upon the  patient'sILD/bronchiectasis -Presently stable on 3 L which is the patient's home requirement -01/25/2019 CTA chest--negative PE, basilar predominant fibrotic changes and bronchiectasis, no pleural effusion or consolidation, right heart enlargement with reflux of contrast into the hepatic veins -check viral respiratory panel -repeat CXR  CKD stage III -Baseline creatinine 1.1-1.4 -Monitor with diuresis -A.m. BMP  Interstitial lung disease/bronchiectasis -continue duo nebs -[facility] -repeat CXR as pt complains increase cough and congestion today -virus respiratory panel  History of pulmonary embolus -Noted on CTA chest 06/13/2018-->started on apixaban -Was instructed to discontinue his apixaban 2 weeks prior to this admission -01/25/2019 CTA chest negative for PE  Coronary artery disease -s/p BMS to LAD in 2014, cath in 10/2016 showing patent stent with 80% distal-LCx stenosis which was unfavorable for PCI -continue ASA  Thrombocytopenia -Secondary to chronic hepatic congestion -Serum 0000000 -Folic [id number]  Essential hypertension -Continue metoprolol, hydralazine and Imdur  Hyperlipidemia -Continue statin  Elevated troponin/Chest Pin -trend is flat -due to demand ischemia -cycle again as pt had chest discomfort today -repeat EKG--personally reviewed--sinus, no concerning STT changes -partly due to GI issue-->relieved with GI cocktail on 01/27/19 -start protonix     Disposition Plan: Home in 1-2days  Family Communication: Family at bedside  Consultants:none  Code Status: FULL   DVT Prophylaxis: [place] Lovenox   Procedures: As Listed in Progress Note Above  Antibiotics: None     Subjective: Pt complains of chest pressure this am while sitting up.  He denies n/v/d, f/c.  Complains increase cough and congestion today.  No hemoptysis.  Objective: Vitals:   01/27/19  0859 01/27/19 0932 01/27/19 1304 01/27/19 1422  BP: (!) 145/128 (!) 152/78 (!) 107/56   Pulse: 81 81 75   Resp:   18   Temp: 97.9 F (36.6 C)  (!) 97.5 F (36.4 C)   TempSrc: Oral  Oral  SpO2: 100%  98% 98%  Weight:      Height:        Intake/Output Summary (Last 24 hours) at 01/27/2019 1455 Last data filed at 01/27/2019 1100 Gross per 24 hour  Intake 960 ml  Output 1950 ml  Net -990 ml   Weight change: 1.1 kg Exam:   General:  Pt is alert, follows commands appropriately, not in acute distress  HEENT: No icterus, No thrush, No neck mass, [place]/AT  Cardiovascular: RRR, S1/S2, no rubs, no gallops  Respiratory: bibasilar rales, no wheeze  Abdomen: Soft/+BS, non tender, non distended, no guarding  Extremities: 1 + LE edema, No lymphangitis, No petechiae, No rashes, no synovitis   Data Reviewed: I have personally reviewed following labs and imaging studies Basic Metabolic Panel: Recent Labs  Lab 01/24/19 1817 01/25/19 0352 01/26/19 0601 01/27/19 0553  NA 141 137 139 137  K 4.4 3.8 4.3 4.4  CL 104 101 98 97*  CO2 29 28 30 29   GLUCOSE 86 79 81 86  BUN 28* 26* 25* 26*  CREATININE 1.45* 1.33* 1.40* 1.51*  CALCIUM 8.9 8.5* 8.5* 8.5*  MG  --   --  2.1  --    Liver Function Tests: Recent Labs  Lab 01/24/19 1817 01/25/19 0352  AST 28 23  ALT 18 16  ALKPHOS 55 52  BILITOT 1.3* 1.4*  PROT 6.8 6.7  ALBUMIN 3.6 3.5   No results for input(s): LIPASE, AMYLASE in the last 168 hours. No results for input(s): AMMONIA in the last 168 hours. Coagulation Profile: No results for input(s): INR, PROTIME in the last 168 hours. CBC: Recent Labs  Lab 01/24/19 1817 01/25/19 0352  WBC 8.4 8.9  NEUTROABS 6.1  --   HGB 11.3* 10.9*  HCT 39.0 37.4*  MCV 96.8 95.2  PLT 138* 156   Cardiac Enzymes: No results for input(s): CKTOTAL, CKMB, CKMBINDEX, TROPONINI in the last 168 hours. BNP: Invalid input(s): POCBNP CBG: No results for input(s): GLUCAP in the last 168  hours. HbA1C: No results for input(s): HGBA1C in the last 72 hours. Urine analysis:    Component Value Date/Time   COLORURINE STRAW (A) 06/12/2018 1142   APPEARANCEUR CLEAR 06/12/2018 1142   APPEARANCEUR Clear 01/18/2017 1554   LABSPEC 1.006 06/12/2018 1142   PHURINE 6.0 06/12/2018 1142   GLUCOSEU NEGATIVE 06/12/2018 1142   HGBUR NEGATIVE 06/12/2018 1142   BILIRUBINUR NEGATIVE 06/12/2018 1142   BILIRUBINUR Negative 01/18/2017 1554   KETONESUR NEGATIVE 06/12/2018 1142   PROTEINUR NEGATIVE 06/12/2018 1142   UROBILINOGEN 1.0 08/27/2009 0830   NITRITE NEGATIVE 06/12/2018 1142   LEUKOCYTESUR NEGATIVE 06/12/2018 1142   Sepsis Labs: @LABRCNTIP (procalcitonin:4,lacticidven:4) ) Recent Results (from the past 240 hour(s))  SARS CORONAVIRUS 2 (Ivone Licht 6-24 HRS) Nasopharyngeal Nasopharyngeal Swab     Status: None   Collection Time: 01/24/19  6:54 PM   Specimen: Nasopharyngeal Swab  Result Value Ref Range Status   SARS Coronavirus 2 NEGATIVE NEGATIVE Final    Comment: (NOTE) SARS-CoV-2 target nucleic acids are NOT DETECTED. The SARS-CoV-2 RNA is generally detectable in upper and lower respiratory specimens during the acute phase of infection. Negative results do not preclude SARS-CoV-2 infection, do not rule out co-infections with other pathogens, and should not be used as the sole basis for treatment or other patient management decisions. Negative results must be combined with clinical observations, patient history, and epidemiological information. The expected result is Negative. Fact Sheet for Patients: SugarRoll.be Fact Sheet for Healthcare Providers: https://www.woods-mathews.com/ This test is  not yet approved or cleared by the [place] and  has been authorized for detection and/or diagnosis of SARS-CoV-2 by FDA under an Emergency Use Authorization (EUA). This EUA will remain  in effect (meaning this test can be used) for the duration  of the COVID-19 declaration under Section 56 4(b)(1) of the Act, 21 U.S.C. section 360bbb-3(b)(1), unless the authorization is terminated or revoked sooner. Performed at [facility], [place] [address], [place], [place] [address]      Scheduled Meds: . aspirin  325 mg Oral Daily  . atorvastatin  80 mg Oral q1800  . budesonide (PULMICORT) nebulizer solution  0.5 mg Nebulization BID  . enoxaparin (LOVENOX) injection  30 mg Subcutaneous Daily  . furosemide  40 mg Intravenous Daily  . guaiFENesin  600 mg Oral BID  . hydrALAZINE  10 mg Oral BID  . ipratropium-albuterol  3 mL Nebulization TID  . isosorbide mononitrate  60 mg Oral Daily  . mouth rinse  15 mL Mouth Rinse BID  . metoprolol tartrate  12.5 mg Oral BID  . polyethylene glycol  17 g Oral BID  . sodium chloride flush  3 mL Intravenous Q12H   Continuous Infusions: . sodium chloride      Procedures/Studies: Ct Angio Chest Pe W Or Wo Contrast  Result Date: 01/25/2019 CLINICAL DATA:  PE suspected, positive D-dimer, history of PE recently stopping Eliquis EXAM: CT ANGIOGRAPHY CHEST WITH CONTRAST TECHNIQUE: Multidetector CT imaging of the chest was performed using the standard protocol during bolus administration of intravenous contrast. Multiplanar CT image reconstructions and MIPs were obtained to evaluate the vascular anatomy. CONTRAST:  135mL OMNIPAQUE IOHEXOL 350 MG/ML SOLN COMPARISON:  Same day chest radiograph, CTA chest 06/13/2018 FINDINGS: Cardiovascular: Satisfactory opacification of the pulmonary arteries to the segmental level. No evidence of acute or residual pulmonary embolus. There is central pulmonary arterial enlargement. There is cardiomegaly with right heart enlargement and reflux of contrast into the hepatic veins. Atherosclerotic calcification of the coronary arteries. Calcifications are present upon the aortic valve leaflets. Atherosclerotic plaque within the normal caliber aorta. Normal branching of the aortic  arch. Mediastinum/Nodes: Few prominent lymph nodes are again seen within the mediastinum and hila including a 9 mm left hilar node (6/38), 8 mm AP window lymph node (6/33), and 9 mm right paratracheal lymph node (6/30). Each of these nodes is similar to the comparison exam. No enlarging lymph nodes are evident. No acute tracheal or esophageal abnormality. Normal appearance of the thyroid gland and thoracic inlet. Lungs/Pleura: There is redemonstration of the basilar predominant fibrotic change and bronchiectasis. Question early development of stacked cyst/honeycomb cyst formation. Few scattered calcified granulomata are present. No acute superimposed consolidative process. No pneumothorax or effusion. Upper Abdomen: 2.2 cm upper pole left renal cyst is noted. Upper abdomen is otherwise unremarkable. Musculoskeletal: No chest wall abnormality. Poor dentition with multiple carious lesions and periapical lucencies partially imaged on this exam. Review of the MIP images confirms the above findings. IMPRESSION: 1. No evidence of acute or residual pulmonary embolus. 2. Cardiomegaly with right heart enlargement and reflux of contrast into the hepatic veins, suggestive of right heart failure. Coronary atherosclerosis. 3. Basilar predominant fibrotic change and bronchiectasis, consistent with interstitial lung disease, findings are categorized as probable UIP per consensus guidelines: Diagnosis of Idiopathic Pulmonary Fibrosis: An Official ATS/ERS/JRS/ALAT Clinical Practice Guideline. [place], Iss 5, [phone], Dec 16 2016. 4. Poor dentition with multiple carious lesions and periapical lucencies partially  imaged on this exam. Correlate with dental examination. 5. Aortic Atherosclerosis (ICD10-I70.0). Electronically Signed   By: Lovena Le M.D.   On: 01/25/2019 02:08   Dg Chest Portable 1 View  Result Date: 01/24/2019 CLINICAL DATA:  Leg swelling.  Shortness of breath.  Weight gain. EXAM:  PORTABLE CHEST 1 VIEW COMPARISON:  06/12/2018 FINDINGS: Patient rotated left. Mild cardiomegaly. Atherosclerosis in the transverse aorta. Probable trace right pleural fluid. Thickening of the right minor fissure. Asymmetric pulmonary interstitial prominence, greater on the right. Mild bibasilar airspace disease. IMPRESSION: Mild cardiomegaly with asymmetric mild pulmonary edema, worse on the right. Bibasilar pulmonary opacities, favored to represent atelectasis or scar. No well-defined lobar consolidation. Aortic Atherosclerosis (ICD10-I70.0). Electronically Signed   By: Abigail Miyamoto M.D.   On: 01/24/2019 18:54    Orson Eva, DO  [facility] Pager [phone]  If 7PM-7AM, please contact night-coverage [email or web address] Password [email or web address] 01/27/2019, 2:55 PM   LOS: 3 days

## 2019-01-27 NOTE — Care Management Important Message (Signed)
Important Message  Patient Details  Name: Anthony Galvan MRN: [id number] Date of Birth: Nov 01, 1921   Medicare Important Message Given:  Yes     [name] 01/27/2019, 3:18 PM

## 2019-01-27 NOTE — Progress Notes (Addendum)
Patient complaining of aching, midsternal chest pain rated at a 5. Denies radiation to extremities. Vital signs obtained and stable. Patient stated that his pain comes and goes. Patient denies shortness of breath at this time. Mid level notified. No new orders.

## 2019-01-27 NOTE — Progress Notes (Signed)
Patient complaining of aching, chest pain rated 5 to mid chest. Denies radiation to extremities. Midlevel notified. New order for GI cocktail

## 2019-01-27 NOTE — Progress Notes (Signed)
Patient c/o chest pain   01/27/19 0859  Vitals  Temp 97.9 F (36.6 C)  Temp Source Oral  BP (!) 145/128  BP Location Right Arm  BP Method Automatic  Patient Position (if appropriate) Lying  Pulse Rate 81  Pulse Rate Source Dinamap  Oxygen Therapy  SpO2 100 %  O2 Device Nasal Cannula  O2 Flow Rate (L/min) 3 L/min  Pain Assessment  Pain Scale 0-10  Pain Score 7  Pain Type Acute pain  Pain Location Chest  Pain Orientation Mid  Pain Radiating Towards left shoulder  Pain Descriptors / Indicators Pressure  Pain Frequency Intermittent  Pain Onset On-going  Patients Stated Pain Goal 0  Pain Intervention(s) MD notified (Comment)  Multiple Pain Sites No  POSS Scale ([place] Opioid Sedation Scale)  POSS *See Group Information* S-Acceptable,Sleep, easy to arouse

## 2019-01-28 ENCOUNTER — Inpatient Hospital Stay (HOSPITAL_COMMUNITY): Payer: Medicare Other

## 2019-01-28 DIAGNOSIS — J9621 Acute and chronic respiratory failure with hypoxia: Secondary | ICD-10-CM

## 2019-01-28 DIAGNOSIS — J9620 Acute and chronic respiratory failure, unspecified whether with hypoxia or hypercapnia: Secondary | ICD-10-CM

## 2019-01-28 DIAGNOSIS — I5031 Acute diastolic (congestive) heart failure: Secondary | ICD-10-CM

## 2019-01-28 DIAGNOSIS — I25118 Atherosclerotic heart disease of native coronary artery with other forms of angina pectoris: Secondary | ICD-10-CM

## 2019-01-28 LAB — BRAIN NATRIURETIC PEPTIDE: B Natriuretic Peptide: 635 pg/mL — ABNORMAL HIGH (ref 0.0–100.0)

## 2019-01-28 LAB — CBC
HCT: 38.8 % — ABNORMAL LOW (ref 39.0–52.0)
Hemoglobin: 11.2 g/dL — ABNORMAL LOW (ref 13.0–17.0)
MCH: 27.7 pg (ref 26.0–34.0)
MCHC: 28.9 g/dL — ABNORMAL LOW (ref 30.0–36.0)
MCV: 95.8 fL (ref 80.0–100.0)
Platelets: 170 10*3/uL (ref 150–400)
RBC: 4.05 MIL/uL — ABNORMAL LOW (ref 4.22–5.81)
RDW: 13.1 % (ref 11.5–15.5)
WBC: 9.6 10*3/uL (ref 4.0–10.5)
nRBC: 0 % (ref 0.0–0.2)

## 2019-01-28 LAB — BASIC METABOLIC PANEL
Anion gap: 11 (ref 5–15)
BUN: 22 mg/dL (ref 8–23)
CO2: 30 mmol/L (ref 22–32)
Calcium: 8.6 mg/dL — ABNORMAL LOW (ref 8.9–10.3)
Chloride: 96 mmol/L — ABNORMAL LOW (ref 98–111)
Creatinine, Ser: 1.39 mg/dL — ABNORMAL HIGH (ref 0.61–1.24)
GFR calc Af Amer: 50 mL/min — ABNORMAL LOW (ref >60)
GFR calc non Af Amer: 43 mL/min — ABNORMAL LOW (ref >60)
Glucose, Bld: 90 mg/dL (ref 70–99)
Potassium: 4.5 mmol/L (ref 3.5–5.1)
Sodium: 137 mmol/L (ref 135–145)

## 2019-01-28 LAB — PROCALCITONIN: Procalcitonin: <0.1 ng/mL

## 2019-01-28 MED ORDER — PANTOPRAZOLE SODIUM 40 MG PO TBEC
40.0000 mg | DELAYED_RELEASE_TABLET | Freq: Every day | ORAL | Status: DC
  Administered 2019-01-28 – 2019-01-29 (×2): 40 mg via ORAL
  Filled 2019-01-28 (×2): qty 1

## 2019-01-28 MED ORDER — ISOSORBIDE DINITRATE 20 MG PO TABS
20.0000 mg | ORAL_TABLET | Freq: Three times a day (TID) | ORAL | Status: DC
  Administered 2019-01-28 – 2019-01-29 (×5): 20 mg via ORAL
  Filled 2019-01-28 (×5): qty 1

## 2019-01-28 MED ORDER — GUAIFENESIN-DM 100-10 MG/5ML PO SYRP
5.0000 mL | ORAL_SOLUTION | ORAL | Status: DC | PRN
  Administered 2019-01-28 – 2019-01-29 (×3): 5 mL via ORAL
  Filled 2019-01-28 (×3): qty 5

## 2019-01-28 NOTE — TOC Initial Note (Signed)
Transition of Care [facility]) - Initial/Assessment Note    Patient Details  Name: Anthony Galvan MRN: [id number] Date of Birth: Aug 11, 1921  [facility] CM/SW Contact:    Boneta Lucks, RN Phone Number: 01/28/2019, 12:26 PM  Clinical Narrative:     Admitted with CHF, High score for readmission.  Pt has insurance and PCP. Pt uses church family or [facility] for transportation. Pt's grandson lives with him and works during the day. Pt has cane, walker and WC. Patient has been to SNF and Used [facility] this year. PT recommending SNF. Patient state [facility] and he has someone coming to the home. Left a message with Santiago Glad his daughter to find out if he has any additional needs and who is providing the in home services.    Expected Discharge Plan: [place] Barriers to Discharge: Continued Medical Work up   Patient Goals and CMS Choice Patient states their goals for this hospitalization and ongoing recovery are:: to go home.      Expected Discharge Plan and Services Expected Discharge Plan: [place]   Prior Living Arrangements/Services   Lives with:: Relatives   Do you feel safe going back to the place where you live?: Yes      Need for Family Participation in Patient Care: Yes (Comment) Care giver support system in place?: Yes (comment)   Criminal Activity/Legal Involvement Pertinent to Current Situation/Hospitalization: No - Comment as needed  Activities of Daily Living Home Assistive Devices/Equipment: None ADL Screening (condition at time of admission) Patient's cognitive ability adequate to safely complete daily activities?: Yes Is the patient deaf or have difficulty hearing?: No Does the patient have difficulty seeing, even when wearing glasses/contacts?: No Does the patient have difficulty concentrating, remembering, or making decisions?: No Patient able to express need for assistance with ADLs?:  Yes Does the patient have difficulty dressing or bathing?: No Independently performs ADLs?: Yes (appropriate for developmental age) Does the patient have difficulty walking or climbing stairs?: Yes Weakness of Legs: Both Weakness of Arms/Hands: Both  Permission Sought/Granted    Emotional Assessment     Affect (typically observed): Accepting Orientation: : Oriented to Self, Oriented to Place, Oriented to  Time, Oriented to Situation Alcohol / Substance Use: Not Applicable Psych Involvement: No (comment)  Admission diagnosis:  Acute on chronic congestive heart failure, unspecified heart failure type [facility]) [I50.9] Patient Active Problem List   Diagnosis Date Noted  . Cor pulmonale (chronic) ([place]) 01/26/2019  . Chronic respiratory failure with hypoxia ([place]) 01/25/2019  . Bronchiectasis ([place]) 01/25/2019  . Acute CHF (congestive heart failure) ([place]) 01/24/2019  . Anemia 01/24/2019  . Thrombocytopenia ([place]) 01/24/2019  . Interstitial lung disease ([place]) 06/15/2018  . Pulmonary hypertension ([place]) 06/15/2018  . Acute pulmonary embolus ([place]) 06/14/2018  . Acute on chronic diastolic CHF (congestive heart failure) ([place]) 06/12/2018  . Acute respiratory failure with hypoxia ([place]) 06/12/2018  . Fracture 05/07/2018  . Closed displaced intertrochanteric fracture of left femur ([place])   . Coronary artery disease involving native heart without angina pectoris   . Dyspepsia 01/18/2017  . Closed disp intertrochanteric fracture of right femur with nonunion 11/30/2016  . PUD (peptic ulcer disease)   . History of coronary artery stent placement 10/08/2016  . Acute on chronic renal failure ([place]) 10/08/2016  . CKD (chronic kidney disease) stage 3, GFR 30-59 ml/min (HCC) 10/08/2016  . Bloating 09/03/2013  . CAD S/P percutaneous coronary  angioplasty   . MI (myocardial infarction) ([place])   . Presence of bare metal stent in LAD coronary artery - VerifFlex BMS 3.0 mm x 12 mm - post-dilated to 3.5 mm  08/01/2012  . Unstable angina ([place]) 07/30/2012  . Gastritis 12/15/2010  . Nausea 08/30/2010  . Hyperlipidemia with target low density lipoprotein (LDL) cholesterol less than 70 mg/dL 05/21/2006  . CATARACT NOS 05/21/2006  . Essential hypertension 05/21/2006  . MYOCARDIAL INFARCTION, HX OF 05/21/2006  . GERD 05/21/2006  . OVERACTIVE BLADDER 05/21/2006  . OSTEOARTHRITIS 05/21/2006  . URINARY INCONTINENCE 05/21/2006   PCP:  [name], MD Pharmacy:   [place], [place] [address] Phone: [phone] Fax: [place], [place] [address] Phone: [phone] Fax: [place], [place] [address] [place] [address] Phone: [phone] Fax: [phone]  [facility] - [address] [place], [place] [address] Phone: [phone] Fax: [place], [place] [address] Phone: [phone] Fax: [phone]     Social Determinants of Health (SDOH) Interventions    Readmission Risk Interventions Readmission Risk Prevention Plan 01/28/2019  Transportation Screening Complete  HRI or Home Care Consult Complete  Social Work Consult for [place] Planning/Counseling [place] Not Complete  Medication Review Press photographer) Complete  Some recent data might be hidden

## 2019-01-28 NOTE — Plan of Care (Signed)
  Problem: Education: Goal: Knowledge of General Education information will improve Description: Including pain rating scale, medication(s)/side effects and non-pharmacologic comfort measures 01/28/2019 1030 by Mearl Latin, PT Outcome: Progressing 01/28/2019 1030 by Mearl Latin, PT Outcome: Progressing   Problem: Health Behavior/Discharge Planning: Goal: Ability to manage health-related needs will improve 01/28/2019 1030 by Mearl Latin, PT Outcome: Progressing 01/28/2019 1030 by Mearl Latin, PT Outcome: Progressing   Problem: Clinical Measurements: Goal: Ability to maintain clinical measurements within normal limits will improve 01/28/2019 1030 by Mearl Latin, PT Outcome: Progressing 01/28/2019 1030 by Mearl Latin, PT Outcome: Progressing Goal: Will remain free from infection 01/28/2019 1030 by Mearl Latin, PT Outcome: Progressing 01/28/2019 1030 by Mearl Latin, PT Outcome: Progressing Goal: Diagnostic test results will improve 01/28/2019 1030 by Mearl Latin, PT Outcome: Progressing 01/28/2019 1030 by Mearl Latin, PT Outcome: Progressing Goal: Respiratory complications will improve 01/28/2019 1030 by Mearl Latin, PT Outcome: Progressing 01/28/2019 1030 by Mearl Latin, PT Outcome: Progressing Goal: Cardiovascular complication will be avoided 01/28/2019 1030 by Mearl Latin, PT Outcome: Progressing 01/28/2019 1030 by Mearl Latin, PT Outcome: Progressing   Problem: Activity: Goal: Risk for activity intolerance will decrease 01/28/2019 1030 by Mearl Latin, PT Outcome: Progressing 01/28/2019 1030 by Mearl Latin, PT Outcome: Progressing   Problem: Nutrition: Goal: Adequate nutrition will be maintained 01/28/2019 1030 by Mearl Latin, PT Outcome: Progressing 01/28/2019 1030 by Mearl Latin, PT Outcome: Progressing   Problem:  Coping: Goal: Level of anxiety will decrease 01/28/2019 1030 by Mearl Latin, PT Outcome: Progressing 01/28/2019 1030 by Mearl Latin, PT Outcome: Progressing   Problem: Elimination: Goal: Will not experience complications related to bowel motility 01/28/2019 1030 by Mearl Latin, PT Outcome: Progressing 01/28/2019 1030 by Mearl Latin, PT Outcome: Progressing Goal: Will not experience complications related to urinary retention 01/28/2019 1030 by Mearl Latin, PT Outcome: Progressing 01/28/2019 1030 by Mearl Latin, PT Outcome: Progressing   Problem: Pain Managment: Goal: General experience of comfort will improve 01/28/2019 1030 by Mearl Latin, PT Outcome: Progressing 01/28/2019 1030 by Mearl Latin, PT Outcome: Progressing   Problem: Safety: Goal: Ability to remain free from injury will improve 01/28/2019 1030 by Mearl Latin, PT Outcome: Progressing 01/28/2019 1030 by Mearl Latin, PT Outcome: Progressing   Problem: Skin Integrity: Goal: Risk for impaired skin integrity will decrease 01/28/2019 1030 by Mearl Latin, PT Outcome: Progressing 01/28/2019 1030 by Mearl Latin, PT Outcome: Progressing   Problem: Acute Rehab PT Goals(only PT should resolve) Goal: Patient Will Transfer Sit To/From Stand 01/28/2019 1030 by Mearl Latin, PT Outcome: Progressing Flowsheets (Taken 01/28/2019 1030) Patient will transfer sit to/from stand: with supervision Note: With RW 01/28/2019 1030 by Rebeca Valdivia, Vianne Bulls, PT Outcome: Progressing Goal: Pt Will Ambulate 01/28/2019 1030 by Mearl Latin, PT Outcome: Progressing Flowsheets (Taken 01/28/2019 1030) Pt will Ambulate:  25 feet  with supervision  with rolling walker 01/28/2019 1030 by Mearl Latin, PT Outcome: Progressing  10:31 AM, 01/28/19 Mearl Latin PT, DPT Physical Therapist at [facility]

## 2019-01-28 NOTE — Progress Notes (Signed)
PROGRESS NOTE  Anthony Galvan [id number] DOB: 1921/07/10 DOA: 01/24/2019 PCP: [name], MD   Brief History: 85 year old male with a history of coronary artery disease(s/p BMS to LAD in 2014, cath in 10/2016 showing patent stent with 80% distal-LCx stenosis which was unfavorable for PCI and medical management was recommended),hypertension, hyperlipidemia, pulmonary embolus February 2020, bronchiectasis/interstitial lung disease, CKD stage III, pulmonary hypertension, chronic respiratory failure on 3 L presenting with 2-day history of worsening shortness of breath and 1 day of increasing lower extremity edema. The patient states that he had gained 9 pounds over 1 day periodwith his weight of 169 pounds on 01/24/2019. He had denied any fevers, chills, nausea, vomiting, diarrhea, cough, hemoptysis, abdominal pain, dysuria, hematuria. He endorses compliance with his medications. He does endorse some indiscretion with fluid intake, but has been compliant with his dietary restrictions. In the emergency department, the patient was afebrile hemodynamically stable saturating 98% on 3 L nasal cannula. [id number], troponin 41>>>39.BMP showed serum creatinine 1.45 with unremarkable LFTs. CBC showed hemoglobin of 10.9. Patient was admitted for diuresis and further work-up.  Assessment/Plan: Acute on chronic diastolic CHF/Cor Pulmonale -06/12/2018 echo EF 60-65%, impaired relaxation, mild decreased RV function, elevated RVSP 60.2 mmHg -Primarily cor pulmonale/right heart failure -Continue IV furosemide>>>po lasix on 10/13 -Daily weights--NEG 2.4 lbs -Accurate I's and O's--NEG 4.1 L -Repeat echo--EF 65-70%, mod-severe TR, RVSP 53.3 -personally reviewed EKG--sinus, no STT changes -may ultimately need Right heart cath -appreciate cardiology consult-->change to imdur to isordil  Chronic respiratory failure with hypoxia -The patient's dyspnea is multifactorial including CHF  superimposed upon the patient'sILD/bronchiectasis -Presently stable on 3 L which is the patient's home requirement -01/25/2019 CTA chest--negative PE, basilar predominant fibrotic changes and bronchiectasis, no pleural effusion or consolidation, right heart enlargement with reflux of contrast into the hepatic veins -check viral respiratory panel -repeat CXR--personally reviewed--no change to chronic interstitial prominence  CKD stage III -Baseline creatinine 1.1-1.4 -Monitor with diuresis  Interstitial lung disease/bronchiectasis -continueduo nebs -[facility] -repeat CXR personally reviewed--no change to chronic interstitial prominence -virus respiratory panel--neg  History of pulmonary embolus -Noted on CTA chest 06/13/2018-->started on apixaban -Was instructed to discontinue his apixaban 2 weeks prior to this admission -01/25/2019 CTA chest negative for PE -01/28/19--US legs--neg DVT  Coronary artery disease -s/p BMS to LAD in 2014, cath in 10/2016 showing patent stent with 80% distal-LCx stenosis which was unfavorable for PCI -continue ASA  Thrombocytopenia -Secondary to chronic hepatic congestion -Serum 0000000 -Folic [id number] -improved with diuresis  Essential hypertension -Continue metoprolol, hydralazine and Isordil  Hyperlipidemia -Continue statin  Elevated troponin/Chest Pin -trend is flat -due to demand ischemia -cycle again as pt had chest discomfort today -repeat EKG--personally reviewed--sinus, no concerning STT changes -partly due to GI issue-->relieved with GI cocktail on 01/27/19 -start protonix     Disposition Plan: Home 01/29/19 if cleared by cards--refuses SNF Family Communication: NoFamily at bedside  Consultants:none  Code Status: FULL   DVT Prophylaxis: [place] Lovenox   Procedures: As Listed in Progress Note  Above  Antibiotics: None   Subjective: Pt did not have cp today.  He states breathing overall is better, but has dyspnea on exertion.  He denies f/c, n/v/d, abd pain.  He is able to lay flat without sob.  Objective: Vitals:   01/28/19 0742 01/28/19 1011 01/28/19 1258 01/28/19 1426  BP:  (!) 127/57 121/73   Pulse:  82 69   Resp:  19   Temp:   (!) 97.5 F (36.4 C)   TempSrc:   Oral   SpO2: 99%  94% 96%  Weight:      Height:        Intake/Output Summary (Last 24 hours) at 01/28/2019 1726 Last data filed at 01/28/2019 1100 Gross per 24 hour  Intake 720 ml  Output 1150 ml  Net -430 ml   Weight change: -0.3 kg Exam:   General:  Pt is alert, follows commands appropriately, not in acute distress  HEENT: No icterus, No thrush, No neck mass, [employer]/AT  Cardiovascular: RRR, S1/S2, no rubs, no gallops  Respiratory: bibasilar crackles, no wheeze  Abdomen: Soft/+BS, non tender, non distended, no guarding  Extremities: trace LE edema, No lymphangitis, No petechiae, No rashes, no synovitis   Data Reviewed: I have personally reviewed following labs and imaging studies Basic Metabolic Panel: Recent Labs  Lab 01/24/19 1817 01/25/19 0352 01/26/19 0601 01/27/19 0553 01/28/19 0406  NA 141 137 139 137 137  K 4.4 3.8 4.3 4.4 4.5  CL 104 101 98 97* 96*  CO2 29 28 30 29 30   GLUCOSE 86 79 81 86 90  BUN 28* 26* 25* 26* 22  CREATININE 1.45* 1.33* 1.40* 1.51* 1.39*  CALCIUM 8.9 8.5* 8.5* 8.5* 8.6*  MG  --   --  2.1  --   --    Liver Function Tests: Recent Labs  Lab 01/24/19 1817 01/25/19 0352  AST 28 23  ALT 18 16  ALKPHOS 55 52  BILITOT 1.3* 1.4*  PROT 6.8 6.7  ALBUMIN 3.6 3.5   No results for input(s): LIPASE, AMYLASE in the last 168 hours. No results for input(s): AMMONIA in the last 168 hours. Coagulation Profile: No results for input(s): INR, PROTIME in the last 168 hours. CBC: Recent Labs  Lab 01/24/19 1817 01/25/19 0352 01/28/19 0406  WBC 8.4 8.9 9.6   NEUTROABS 6.1  --   --   HGB 11.3* 10.9* 11.2*  HCT 39.0 37.4* 38.8*  MCV 96.8 95.2 95.8  PLT 138* 156 170   Cardiac Enzymes: No results for input(s): CKTOTAL, CKMB, CKMBINDEX, TROPONINI in the last 168 hours. BNP: Invalid input(s): POCBNP CBG: No results for input(s): GLUCAP in the last 168 hours. HbA1C: No results for input(s): HGBA1C in the last 72 hours. Urine analysis:    Component Value Date/Time   COLORURINE STRAW (A) 06/12/2018 1142   APPEARANCEUR CLEAR 06/12/2018 1142   APPEARANCEUR Clear 01/18/2017 1554   LABSPEC 1.006 06/12/2018 1142   PHURINE 6.0 06/12/2018 1142   GLUCOSEU NEGATIVE 06/12/2018 1142   HGBUR NEGATIVE 06/12/2018 1142   BILIRUBINUR NEGATIVE 06/12/2018 1142   BILIRUBINUR Negative 01/18/2017 1554   KETONESUR NEGATIVE 06/12/2018 1142   PROTEINUR NEGATIVE 06/12/2018 1142   UROBILINOGEN 1.0 08/27/2009 0830   NITRITE NEGATIVE 06/12/2018 1142   LEUKOCYTESUR NEGATIVE 06/12/2018 1142   Sepsis Labs: @LABRCNTIP (procalcitonin:4,lacticidven:4) ) Recent Results (from the past 240 hour(s))  SARS CORONAVIRUS 2 (Sanjit Mcmichael 6-24 HRS) Nasopharyngeal Nasopharyngeal Swab     Status: None   Collection Time: 01/24/19  6:54 PM   Specimen: Nasopharyngeal Swab  Result Value Ref Range Status   SARS Coronavirus 2 NEGATIVE NEGATIVE Final    Comment: (NOTE) SARS-CoV-2 target nucleic acids are NOT DETECTED. The SARS-CoV-2 RNA is generally detectable in upper and lower respiratory specimens during the acute phase of infection. Negative results do not preclude SARS-CoV-2 infection, do not rule out co-infections with other pathogens, and should not be used as the  sole basis for treatment or other patient management decisions. Negative results must be combined with clinical observations, patient history, and epidemiological information. The expected result is Negative. Fact Sheet for Patients: SugarRoll.be Fact Sheet for Healthcare  Providers: https://www.woods-mathews.com/ This test is not yet approved or cleared by the [place] FDA and  has been authorized for detection and/or diagnosis of SARS-CoV-2 by FDA under an Emergency Use Authorization (EUA). This EUA will remain  in effect (meaning this test can be used) for the duration of the COVID-19 declaration under Section 56 4(b)(1) of the Act, 21 U.S.C. section 360bbb-3(b)(1), unless the authorization is terminated or revoked sooner. Performed at [facility], [place] [address] [place], [place] [address]   Respiratory Panel by PCR     Status: None   Collection Time: 01/27/19  2:53 PM   Specimen: Nasopharyngeal Swab; Respiratory  Result Value Ref Range Status   Adenovirus NOT DETECTED NOT DETECTED Final   Coronavirus 229E NOT DETECTED NOT DETECTED Final    Comment: (NOTE) The Coronavirus on the Respiratory Panel, DOES NOT test for the novel  Coronavirus (2019 nCoV)    Coronavirus HKU1 NOT DETECTED NOT DETECTED Final   Coronavirus NL63 NOT DETECTED NOT DETECTED Final   Coronavirus OC43 NOT DETECTED NOT DETECTED Final   Metapneumovirus NOT DETECTED NOT DETECTED Final   Rhinovirus / Enterovirus NOT DETECTED NOT DETECTED Final   Influenza A NOT DETECTED NOT DETECTED Final   Influenza B NOT DETECTED NOT DETECTED Final   Parainfluenza Virus 1 NOT DETECTED NOT DETECTED Final   Parainfluenza Virus 2 NOT DETECTED NOT DETECTED Final   Parainfluenza Virus 3 NOT DETECTED NOT DETECTED Final   Parainfluenza Virus 4 NOT DETECTED NOT DETECTED Final   Respiratory Syncytial Virus NOT DETECTED NOT DETECTED Final   Bordetella pertussis NOT DETECTED NOT DETECTED Final   Chlamydophila pneumoniae NOT DETECTED NOT DETECTED Final   Mycoplasma pneumoniae NOT DETECTED NOT DETECTED Final    Comment: Performed at [facility], [place]. [address] [place], [place] [address]     Scheduled Meds: . aspirin  325 mg Oral Daily  . atorvastatin  80 mg Oral q1800  .  budesonide (PULMICORT) nebulizer solution  0.5 mg Nebulization BID  . enoxaparin (LOVENOX) injection  30 mg Subcutaneous Daily  . furosemide  40 mg Oral Daily  . guaiFENesin  600 mg Oral BID  . hydrALAZINE  10 mg Oral BID  . ipratropium-albuterol  3 mL Nebulization TID  . isosorbide dinitrate  20 mg Oral TID  . mouth rinse  15 mL Mouth Rinse BID  . metoprolol tartrate  12.5 mg Oral BID  . polyethylene glycol  17 g Oral BID  . sodium chloride flush  3 mL Intravenous Q12H   Continuous Infusions: . sodium chloride      Procedures/Studies: Ct Angio Chest Pe W Or Wo Contrast  Result Date: 01/25/2019 CLINICAL DATA:  PE suspected, positive D-dimer, history of PE recently stopping Eliquis EXAM: CT ANGIOGRAPHY CHEST WITH CONTRAST TECHNIQUE: Multidetector CT imaging of the chest was performed using the standard protocol during bolus administration of intravenous contrast. Multiplanar CT image reconstructions and MIPs were obtained to evaluate the vascular anatomy. CONTRAST:  110mL OMNIPAQUE IOHEXOL 350 MG/ML SOLN COMPARISON:  Same day chest radiograph, CTA chest 06/13/2018 FINDINGS: Cardiovascular: Satisfactory opacification of the pulmonary arteries to the segmental level. No evidence of acute or residual pulmonary embolus. There is central pulmonary arterial enlargement. There is cardiomegaly with right heart enlargement and  reflux of contrast into the hepatic veins. Atherosclerotic calcification of the coronary arteries. Calcifications are present upon the aortic valve leaflets. Atherosclerotic plaque within the normal caliber aorta. Normal branching of the aortic arch. Mediastinum/Nodes: Few prominent lymph nodes are again seen within the mediastinum and hila including a 9 mm left hilar node (6/38), 8 mm AP window lymph node (6/33), and 9 mm right paratracheal lymph node (6/30). Each of these nodes is similar to the comparison exam. No enlarging lymph nodes are evident. No acute tracheal or esophageal  abnormality. Normal appearance of the thyroid gland and thoracic inlet. Lungs/Pleura: There is redemonstration of the basilar predominant fibrotic change and bronchiectasis. Question early development of stacked cyst/honeycomb cyst formation. Few scattered calcified granulomata are present. No acute superimposed consolidative process. No pneumothorax or effusion. Upper Abdomen: 2.2 cm upper pole left renal cyst is noted. Upper abdomen is otherwise unremarkable. Musculoskeletal: No chest wall abnormality. Poor dentition with multiple carious lesions and periapical lucencies partially imaged on this exam. Review of the MIP images confirms the above findings. IMPRESSION: 1. No evidence of acute or residual pulmonary embolus. 2. Cardiomegaly with right heart enlargement and reflux of contrast into the hepatic veins, suggestive of right heart failure. Coronary atherosclerosis. 3. Basilar predominant fibrotic change and bronchiectasis, consistent with interstitial lung disease, findings are categorized as probable UIP per consensus guidelines: Diagnosis of Idiopathic Pulmonary Fibrosis: An Official ATS/ERS/JRS/ALAT Clinical Practice Guideline. [place], Iss 5, [phone], Dec 16 2016. 4. Poor dentition with multiple carious lesions and periapical lucencies partially imaged on this exam. Correlate with dental examination. 5. Aortic Atherosclerosis (ICD10-I70.0). Electronically Signed   By: Lovena Le M.D.   On: 01/25/2019 02:08   US Venous Img Lower Bilateral  Result Date: 01/28/2019 CLINICAL DATA:  Bilateral lower extremity edema. History of pulmonary embolism. Evaluate for DVT. EXAM: BILATERAL LOWER EXTREMITY VENOUS DOPPLER ULTRASOUND TECHNIQUE: Gray-scale sonography with graded compression, as well as color Doppler and duplex ultrasound were performed to evaluate the lower extremity deep venous systems from the level of the common femoral vein and including the common femoral, femoral,  profunda femoral, popliteal and calf veins including the posterior tibial, peroneal and gastrocnemius veins when visible. The superficial great saphenous vein was also interrogated. Spectral Doppler was utilized to evaluate flow at rest and with distal augmentation maneuvers in the common femoral, femoral and popliteal veins. COMPARISON:  None. FINDINGS: RIGHT LOWER EXTREMITY Common Femoral Vein: No evidence of thrombus. Normal compressibility, respiratory phasicity and response to augmentation. Saphenofemoral Junction: No evidence of thrombus. Normal compressibility and flow on color Doppler imaging. Profunda Femoral Vein: No evidence of thrombus. Normal compressibility and flow on color Doppler imaging. Femoral Vein: No evidence of thrombus. Normal compressibility, respiratory phasicity and response to augmentation. Popliteal Vein: No evidence of thrombus. Normal compressibility, respiratory phasicity and response to augmentation. Calf Veins: No evidence of thrombus. Normal compressibility and flow on color Doppler imaging. Superficial Great Saphenous Vein: No evidence of thrombus. Normal compressibility. Venous Reflux:  None. Other Findings:  None. LEFT LOWER EXTREMITY Common Femoral Vein: No evidence of thrombus. Normal compressibility, respiratory phasicity and response to augmentation. Saphenofemoral Junction: No evidence of thrombus. Normal compressibility and flow on color Doppler imaging. Profunda Femoral Vein: No evidence of thrombus. Normal compressibility and flow on color Doppler imaging. Femoral Vein: No evidence of thrombus. Normal compressibility, respiratory phasicity and response to augmentation. Popliteal Vein: No evidence of thrombus. Normal compressibility, respiratory phasicity and response to augmentation. Calf Veins:  No evidence of thrombus. Normal compressibility and flow on color Doppler imaging. Superficial Great Saphenous Vein: No evidence of thrombus. Normal compressibility. Venous  Reflux:  None. Other Findings:  None. IMPRESSION: No evidence of DVT within either lower extremity. Electronically Signed   By: Sandi Mariscal M.D.   On: 01/28/2019 13:02   Dg Chest Port 1 View  Result Date: 01/27/2019 CLINICAL DATA:  ACUTE ON CHRONIC RESPIRATORY FAILURE PATIENT STATES " HE WAS HAVING SOME CHEST PAIN EARLIER BUT THE PAIN IS BETTER SINCE HE HAD A BREATHING TREATMENT EARLIER TODAY, STILL HAVING SOME CHEST PAIN, SOME SOB AND COUGH " HISTORY OF CH.*comment was truncated* EXAM: PORTABLE CHEST 1 VIEW COMPARISON:  Radiograph 01/24/2019 FINDINGS: Stable large cardiac silhouette. There is bilateral pleural effusions. There is bibasilar atelectasis. Interstitial lung disease at the lung bases. Interstitial edema in the upper lobes. IMPRESSION: No significant change. Findings correspond to chronic interstitial lung disease and interstitial edema seen on comparison CT. Cardiomegaly. Electronically Signed   By: Suzy Bouchard M.D.   On: 01/27/2019 17:13   Dg Chest Portable 1 View  Result Date: 01/24/2019 CLINICAL DATA:  Leg swelling.  Shortness of breath.  Weight gain. EXAM: PORTABLE CHEST 1 VIEW COMPARISON:  06/12/2018 FINDINGS: Patient rotated left. Mild cardiomegaly. Atherosclerosis in the transverse aorta. Probable trace right pleural fluid. Thickening of the right minor fissure. Asymmetric pulmonary interstitial prominence, greater on the right. Mild bibasilar airspace disease. IMPRESSION: Mild cardiomegaly with asymmetric mild pulmonary edema, worse on the right. Bibasilar pulmonary opacities, favored to represent atelectasis or scar. No well-defined lobar consolidation. Aortic Atherosclerosis (ICD10-I70.0). Electronically Signed   By: Abigail Miyamoto M.D.   On: 01/24/2019 18:54    Orson Eva, DO  [facility] Pager [phone]  If 7PM-7AM, please contact night-coverage [email or web address] Password [email or web address] 01/28/2019, 5:26 PM   LOS: 4 days

## 2019-01-28 NOTE — Consult Note (Signed)
Cardiology Consult    Patient ID: Anthony Galvan; [id number]; 02/21/1922   Admit date: 01/24/2019 Date of Consult: 01/28/2019  Primary Care Provider: [name], MD Primary Cardiologist: Glenetta Hew, MD   Patient Profile    Anthony Galvan is a 85 y.o. male with past medical history of CAD (s/p BMS to LAD in 2014, repeat cath in 10/2016 showing patent stent and 80% distal LCx stenosis not suitable for PCI and medical management recommended), ILD (on 3L [place] at baseline), HTN, HLD and GERD who is being seen today for the evaluation of CHF and chest pain at the request of Dr. Carles Collet.   History of Present Illness    Anthony Galvan was admitted for worsening dyspnea in 05/2018 thought to initially be secondary to a CHF exacerbation but was found to have a acute pulmonary embolus by CTA. He was started on anticoagulation during admission and continued on Eliquis at the time of discharge. CT did show interstitial lung disease with a component of bronchiectasis and Pulmonology follow-up was recommended as it was thought he had some degree of pulmonary hypertension.  He presented back to [facility] ED on 01/24/2019 for evaluation of worsening dyspnea and lower extremity edema with an associated 9 pound weight gain. Labs show WBC 8.4, Hgb 11.3, platelets 138, Na+ 141, K+ 4.4 and creatinine 1.45 (baseline 1.2 - 1.3). BNP 697. COVID negative. D-dimer 1.62.Initial HS Troponin 41 with repeat values of 39, 30, and 29. EKG shows NSR, HR 66, with inferior infarct pattern. CXR showed mild cardiomegaly with mild pulmonary edema. CTA showed no evidence of an acute or residual PE but he was noted to have cardiomegaly with right heart enlargement and coronary atherosclerosis. Also reported basilar predominant fibrotic change and bronchiectasis consistent with interstitial lung disease.  He was started on IV Lasix 40mg  daily with a recorded output of -4.1L thus far (was on PO Lasix 20mg  daily prior to admission).  Weight recorded as 169 lbs on admission, at 163 lbs today. A repeat echocardiogram was obtained which showed a preserved EF of 65 to 70% with elevated LVEDP and the right ventricle had normal systolic function but was moderately enlarged. Was noted to have moderate to severe TR with a right atrial pressure of 10 mmHg, the estimated right ventricular systolic pressure is moderately elevated at 53.3 mmHg.  In talking with the patient, he reports he was feeling at baseline until Friday when he developed worsening lower extremity edema. Says he has baseline dyspnea on exertion and is on 3 L nasal cannula at baseline and denies any acute changes in his breathing. He does have intermittent episodes of chest pain at home which can occur at rest or with activity. Typically resolves with 1 SL NTG. Has not had to utilize this regularly. No specific palpitations, orthopnea or PND.   He reports good compliance with his current medication regimen. He finished his course of Eliquis and is no longer on this by his report.  He does take ASA 325 mg daily at the discretion of the [place].  He also reports compliance with his Lasix and says his weight is typically around 161 - 163 lbs. He does a lot of his own cooking but does consume frozen meals regularly.  He is also participant in [employer] and says they typically do not add a lot of seasoning to the food.   Past Medical History:  Diagnosis Date  . Bronchiectasis ([place]) 06/13/2018  . CAD S/P percutaneous  coronary angioplasty 08/01/2012   PCI of mid LAD with a VeriFlex Bare Metal Stent 3.0 mm x 12 mm - post-dilated to 3.5 mm.;  Catheterization in July 2018 showed with severe heavily calcified distal circumflex-left PDA stenosis.  Not favorable for PCI.  Marland Kitchen Congestive heart failure (CHF) ([place])   . Duodenal ulcer May 2011   on EGD  . GERD (gastroesophageal reflux disease)   . Helicobacter pylori gastritis MAY 2012 HP STOOL AG NEG  . HTN (hypertension)   .  Hyperlipidemia   . ILD (interstitial lung disease) ([place]) 06/13/2018  . MI (myocardial infarction) ([place]) 1976, 1980, [place] for at [facility] and [place] (Dr. Iona Beard)  . Osteoarthritis   . Pulmonary embolism ([place]) 06/13/2018  . Pulmonary hypertension ([place]) 10/21/2016  . S/P colonoscopy 2011   normal, internal hemorrhoids    Past Surgical History:  Procedure Laterality Date  . CARDIAC CATHETERIZATION  01/30/2007   D1 75 %, mid LAD 50%, 50-70% circumflex, 50-70% RCA.(Dr. Adora Fridge)  . CARDIAC CATHETERIZATION  12/11/2001   normal L main, small RCA, dominant LL Cfx with mild diffuse disease, LAD with mid 10-20% stenosis, ramus intermedius with mild diffuse disease (Dr. Jackie Plum)  . CATARACT EXTRACTION    . COLONOSCOPY  OCT 2011 ARS   [facility] [place]   in setting of MI  . ESOPHAGOGASTRODUODENOSCOPY N/A 10/09/2016   Procedure: ESOPHAGOGASTRODUODENOSCOPY (EGD);  Surgeon: Danie Binder, MD;  [facility];  Service: Endoscopy;  Laterality: N/A;  . FEMUR IM NAIL Right 12/01/2016   Procedure: INTRAMEDULLARY (IM) NAIL RIGHT HIP;  Surgeon: Altamese Fairchilds, MD;  Location: [place];  Service: Orthopedics;  Laterality: Right;  . FEMUR IM NAIL Left 05/07/2018   Procedure: INTRAMEDULLARY (IM) NAIL FEMORAL;  Surgeon: Nicholes Stairs, MD;  Location: [place];  Service: Orthopedics;  Laterality: Left;  . LEFT HEART CATH AND CORONARY ANGIOGRAPHY N/A 10/23/2016   Procedure: Left Heart Cath and Coronary Angiography;  Surgeon: Sherren Mocha, MD;  Location: [place] CV LAB;  Service: Cardiovascular: Two-vessel CAD (left dominant).  Patent BMS in p LAD.  Severe, heavily calcified dCx-LPDA lesion.  Not favorable for PCI.  Marland Kitchen LEFT HEART CATHETERIZATION WITH CORONARY ANGIOGRAM N/A 08/01/2012   Procedure: LEFT HEART CATHETERIZATION WITH CORONARY ANGIOGRAM;  Surgeon: Leonie Man, MD;  Location: [facility] CATH LAB: Mid LAD 99% apple core; D2 ostial 70-80% (2 small for  PCI) small nondominant RCA 60-70%. Distal circumflex/L PDA ~50% --> PCI of LAD  . NM MYOCAR PERF WALL MOTION  08/2003   adenosine stress - focal decreased perfusion defect in distal inferior wall, no significant ischemic changes  . PERCUTANEOUS STENT INTERVENTION  08/01/2012   Procedure: PERCUTANEOUS STENT INTERVENTION;  Surgeon: Leonie Man, MD;  Location: [facility] CATH LAB; ;PCI of mid LAD with a VeriFlex Bare Metal Stent 3.0 mm x 12 mm - post-dilated to 3.5 mm.  . TRANSTHORACIC ECHOCARDIOGRAM  10/2016   mild LVH - 60-65%. Gr 1 DD. No RWMA. Mild MR. Mod TR. Mild RA dilation.  PAP ~ 60 mmHg.  Marland Kitchen UPPER GASTROINTESTINAL ENDOSCOPY  08/2009   MELENA, HEMATEMESIS --> DUODENAL ULCER, Bx: H PYLORI POS     Home Medications:  Prior to Admission medications   Medication Sig Start Date End Date Taking? Authorizing Provider  albuterol (PROVENTIL) (2.5 MG/3ML) 0.083% nebulizer solution Take 3 mLs (2.5 mg total) by nebulization every 6 (six) hours as needed for wheezing or  shortness of breath. 06/15/18  Yes Kathie Dike, MD  aspirin 325 MG tablet Take 1 tablet (325 mg total) by mouth daily. 05/13/18  Yes Mullis, Kiersten P, DO  atorvastatin (LIPITOR) 80 MG tablet Take 1 tablet (80 mg total) by mouth daily at 6 PM. 08/02/12  Yes Hager, Einar Pheasant, PA-C  calcium carbonate (OS-CAL - DOSED IN MG OF ELEMENTAL CALCIUM) 1250 (500 Ca) MG tablet Take 1 tablet (500 mg of elemental calcium total) by mouth daily with breakfast. 05/13/18  Yes Mullis, Kiersten P, DO  ELIQUIS DVT/PE STARTER PACK (ELIQUIS STARTER PACK) 5 MG TABS Take as directed on package: start with two-5mg  tablets twice daily for 7 days. On day 8, switch to one-5mg  tablet twice daily. 06/15/18  Yes Kathie Dike, MD  furosemide (LASIX) 20 MG tablet Take 1 tablet (20 mg total) by mouth daily. 06/16/18  Yes Kathie Dike, MD  isosorbide mononitrate (IMDUR) 30 MG 24 hr tablet Take 2 tablets (60 mg total) by mouth daily. 06/15/18  Yes Kathie Dike, MD  metoprolol  tartrate (LOPRESSOR) 25 MG tablet Take 0.5 tablets (12.5 mg total) by mouth 2 (two) times daily. 05/13/18  Yes Mullis, Kiersten P, DO  Multiple Vitamin (MULTIVITAMIN WITH MINERALS) TABS tablet Take 1 tablet by mouth daily.   Yes [provider]  Vitamin D, Ergocalciferol, (DRISDOL) 1.25 MG (50000 UT) CAPS capsule Take 1 capsule (50,000 Units total) by mouth every 7 (seven) days. 05/16/18  Yes Mullis, Kiersten P, DO  acetaminophen (TYLENOL) 325 MG tablet Take 2 tablets (650 mg total) by mouth 3 (three) times daily as needed (pain). Patient not taking: Reported on 01/24/2019 12/04/16   Ainsley Spinner, PA-C  guaiFENesin (MUCINEX) 600 MG 12 hr tablet Take 1 tablet (600 mg total) by mouth 2 (two) times daily. 06/15/18   Kathie Dike, MD  Nebulizer MISC Please provide 1 nebulizer machine and all necessary supplies. Diagnosis: J84, Office number: [phone] Patient not taking: Reported on 06/17/2018 06/15/18   Kathie Dike, MD  NITROSTAT 0.4 MG SL tablet PLACE 1 TABLET UNDER TONGUE EVERY 5 MINUTES FOR 3 DOSES AS NEEDED. 11/17/13   Leonie Man, MD  polyethylene glycol [facility] / Floria Raveling) packet Take 17 g by mouth 2 (two) times daily. 05/13/18   Mullis, Archie Endo, DO    Inpatient Medications: Scheduled Meds: . aspirin  325 mg Oral Daily  . atorvastatin  80 mg Oral q1800  . budesonide (PULMICORT) nebulizer solution  0.5 mg Nebulization BID  . enoxaparin (LOVENOX) injection  30 mg Subcutaneous Daily  . furosemide  40 mg Oral Daily  . guaiFENesin  600 mg Oral BID  . hydrALAZINE  10 mg Oral BID  . ipratropium-albuterol  3 mL Nebulization TID  . isosorbide mononitrate  60 mg Oral Daily  . mouth rinse  15 mL Mouth Rinse BID  . metoprolol tartrate  12.5 mg Oral BID  . polyethylene glycol  17 g Oral BID  . sodium chloride flush  3 mL Intravenous Q12H   Continuous Infusions: . sodium chloride     PRN Meds: sodium chloride, acetaminophen **OR** acetaminophen, albuterol, alum & mag  hydroxide-simeth, hydrALAZINE, sodium chloride flush  Allergies:    Allergies  Allergen Reactions  . Lisinopril Other (See Comments)    "Allergic," per the [facility]- patient is unaware of any reaction (??)    Social History:   Social History   Socioeconomic History  . Marital status: Widowed    Spouse name: Not on file  . Number of  children: 3  . Years of education: Not on file  . Highest education level: Not on file  Occupational History  . Not on file  Social Needs  . Financial resource strain: Not on file  . Food insecurity    Worry: Not on file    Inability: Not on file  . Transportation needs    Medical: Not on file    Non-medical: Not on file  Tobacco Use  . Smoking status: Never Smoker  . Smokeless tobacco: Never Used  Substance and Sexual Activity  . Alcohol use: No  . Drug use: No  . Sexual activity: Not on file  Lifestyle  . Physical activity    Days per week: Not on file    Minutes per session: Not on file  . Stress: Not on file  Relationships  . Social Herbalist on phone: Not on file    Gets together: Not on file    Attends religious service: Not on file    Active member of club or organization: Not on file    Attends meetings of clubs or organizations: Not on file    Relationship status: Not on file  . Intimate partner violence    Fear of current or ex partner: Not on file    Emotionally abused: Not on file    Physically abused: Not on file    Forced sexual activity: Not on file  Other Topics Concern  . Not on file  Social History Narrative   Is a father of 3 with 3 grandchildren 3 great-grandchildren. He lives with one of his 2 grandsons.   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 He was a war 2 veteran in the [place]. 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   He never smoked. He does not drink alcohol.   He is  very active, and a majority. He walks 30+ minutes every day. He also likes to play golf.     Family History:    Family History  Problem Relation Age of Onset  . Heart disease Mother   . Kidney disease Father   . Heart disease Brother 64  . Heart disease Sister   . Colon cancer Neg Hx       Review of Systems    General:  No chills, fever, night sweats or weight changes.  Cardiovascular:  No orthopnea, palpitations, paroxysmal nocturnal dyspnea. Positive for chest pain, edema and dyspnea on exertion.  Dermatological: No rash, lesions/masses Respiratory: No cough, dyspnea Urologic: No hematuria, dysuria Abdominal:   No nausea, vomiting, diarrhea, bright red blood per rectum, melena, or hematemesis Neurologic:  No visual changes, wkns, changes in mental status. All other systems reviewed and are otherwise negative except as noted above.  Physical Exam/Data    Vitals:   01/28/19 0500 01/28/19 0503 01/28/19 0737 01/28/19 0742  BP:  (!) 152/77    Pulse:  69    Resp:  17    Temp:  98.1 F (36.7 C)    TempSrc:      SpO2:  96% 99% 99%  Weight: 74.2 kg     Height:        Intake/Output Summary (Last 24 hours) at 01/28/2019 0808 Last data filed at 01/28/2019 [id number] Gross per 24 hour  Intake 720 ml  Output 1600 ml  Net -  880 ml   Filed Weights   01/26/19 0500 01/27/19 0435 01/28/19 0500  Weight: 73.4 kg 74.5 kg 74.2 kg   Body mass index is 28.08 kg/m.   General: Pleasant, elderly male appearing in NAD Psych: Normal affect. Neuro: Alert and oriented X 3. Moves all extremities spontaneously. HEENT: Normal  Neck: Supple without bruits or JVD. Lungs:  Resp regular and unlabored, decreased breath sounds along bases bilaterally. Heart: RRR no s3, s4, or murmurs. Abdomen: Soft, non-tender, non-distended, BS + x 4.  Extremities: No clubbing or cyanosis. 1+ pitting edema up to knees bilaterally. DP/PT/Radials 2+ and equal bilaterally.   EKG:  The EKG was personally reviewed  and demonstrates: NSR, HR 66, with inferior infarct pattern. No acute ST changes when compared to prior tracings.   Telemetry:  Telemetry was personally reviewed and demonstrates:  NSR, HR in 60's to 70's with 4 beats NSVT.    Labs/Studies     Relevant CV Studies:  Cardiac Catheterization: 10/2016 1. Two-vessel coronary artery disease (left dominant) with patency of the stented segment in the proximal LAD and severe heavily calcified distal circumflex/left PDA stenosis unfavorable for PCI  2. Known normal LV function by noninvasive assessment  3. Normal LVEDP  Recommend: Medical therapy for CAD. The distal circumflex/PDA lesion is severely calcified with a distal vessel location that is unfavorable for PCI and I would be very hesitant to use atherectomy in this distal location.  Echocardiogram: 01/25/2019 IMPRESSIONS    1. Left ventricular ejection fraction, by visual estimation, is 65 to 70%. The left ventricle has normal function. Normal left ventricular size. There is no left ventricular hypertrophy.  2. Elevated left ventricular end-diastolic pressure.  3. Left ventricular diastolic Doppler parameters are consistent with impaired relaxation pattern of LV diastolic filling.  4. Global right ventricle has normal systolic function.The right ventricular size is moderately enlarged. No increase in right ventricular wall thickness.  5. Left atrial size was normal.  6. Right atrial size was normal.  7. The mitral valve is normal in structure. No evidence of mitral valve regurgitation. No evidence of mitral stenosis.  8. The tricuspid valve is normal in structure. Tricuspid valve regurgitation moderate-severe.  9. The aortic valve is normal in structure. Aortic valve regurgitation is mild by color flow Doppler. Mild to moderate aortic valve sclerosis/calcification without any evidence of aortic stenosis. 10. The pulmonic valve was normal in structure. Pulmonic valve regurgitation is  not visualized by color flow Doppler. 11. Moderately elevated pulmonary artery systolic pressure. 12. The tricuspid regurgitant velocity is 3.29 m/s, and with an assumed right atrial pressure of 10 mmHg, the estimated right ventricular systolic pressure is moderately elevated at 53.3 mmHg. 13. The inferior vena cava is normal in size with greater than 50% respiratory variability, suggesting right atrial pressure of 3 mmHg.    Laboratory Data:  Chemistry Recent Labs  Lab 01/26/19 0601 01/27/19 0553 01/28/19 0406  NA 139 137 137  K 4.3 4.4 4.5  CL 98 97* 96*  CO2 30 29 30   GLUCOSE 81 86 90  BUN 25* 26* 22  CREATININE 1.40* 1.51* 1.39*  CALCIUM 8.5* 8.5* 8.6*  GFRNONAA 43* 39* 43*  GFRAA 49* 45* 50*  ANIONGAP 11 11 11     Recent Labs  Lab 01/24/19 1817 01/25/19 0352  PROT 6.8 6.7  ALBUMIN 3.6 3.5  AST 28 23  ALT 18 16  ALKPHOS 55 52  BILITOT 1.3* 1.4*   Hematology Recent Labs  Lab 01/24/19 1817 01/25/19  [id number] 01/28/19 0406  WBC 8.4 8.9 9.6  RBC 4.03* 3.93* 4.05*  HGB 11.3* 10.9* 11.2*  HCT 39.0 37.4* 38.8*  MCV 96.8 95.2 95.8  MCH 28.0 27.7 27.7  MCHC 29.0* 29.1* 28.9*  RDW 13.4 13.2 13.1  PLT 138* 156 170   Cardiac EnzymesNo results for input(s): TROPONINI in the last 168 hours. No results for input(s): TROPIPOC in the last 168 hours.  BNP Recent Labs  Lab 01/24/19 1818 01/28/19 0406  BNP 697.0* 635.0*    DDimer  Recent Labs  Lab 01/24/19 2118  DDIMER 1.62*    Radiology/Studies:  Ct Angio Chest Pe W Or Wo Contrast  Result Date: 01/25/2019 CLINICAL DATA:  PE suspected, positive D-dimer, history of PE recently stopping Eliquis EXAM: CT ANGIOGRAPHY CHEST WITH CONTRAST TECHNIQUE: Multidetector CT imaging of the chest was performed using the standard protocol during bolus administration of intravenous contrast. Multiplanar CT image reconstructions and MIPs were obtained to evaluate the vascular anatomy. CONTRAST:  180mL OMNIPAQUE IOHEXOL 350 MG/ML SOLN  COMPARISON:  Same day chest radiograph, CTA chest 06/13/2018 FINDINGS: Cardiovascular: Satisfactory opacification of the pulmonary arteries to the segmental level. No evidence of acute or residual pulmonary embolus. There is central pulmonary arterial enlargement. There is cardiomegaly with right heart enlargement and reflux of contrast into the hepatic veins. Atherosclerotic calcification of the coronary arteries. Calcifications are present upon the aortic valve leaflets. Atherosclerotic plaque within the normal caliber aorta. Normal branching of the aortic arch. Mediastinum/Nodes: Few prominent lymph nodes are again seen within the mediastinum and hila including a 9 mm left hilar node (6/38), 8 mm AP window lymph node (6/33), and 9 mm right paratracheal lymph node (6/30). Each of these nodes is similar to the comparison exam. No enlarging lymph nodes are evident. No acute tracheal or esophageal abnormality. Normal appearance of the thyroid gland and thoracic inlet. Lungs/Pleura: There is redemonstration of the basilar predominant fibrotic change and bronchiectasis. Question early development of stacked cyst/honeycomb cyst formation. Few scattered calcified granulomata are present. No acute superimposed consolidative process. No pneumothorax or effusion. Upper Abdomen: 2.2 cm upper pole left renal cyst is noted. Upper abdomen is otherwise unremarkable. Musculoskeletal: No chest wall abnormality. Poor dentition with multiple carious lesions and periapical lucencies partially imaged on this exam. Review of the MIP images confirms the above findings. IMPRESSION: 1. No evidence of acute or residual pulmonary embolus. 2. Cardiomegaly with right heart enlargement and reflux of contrast into the hepatic veins, suggestive of right heart failure. Coronary atherosclerosis. 3. Basilar predominant fibrotic change and bronchiectasis, consistent with interstitial lung disease, findings are categorized as probable UIP per  consensus guidelines: Diagnosis of Idiopathic Pulmonary Fibrosis: An Official ATS/ERS/JRS/ALAT Clinical Practice Guideline. [place], Iss 5, [phone], Dec 16 2016. 4. Poor dentition with multiple carious lesions and periapical lucencies partially imaged on this exam. Correlate with dental examination. 5. Aortic Atherosclerosis (ICD10-I70.0). Electronically Signed   By: Lovena Le M.D.   On: 01/25/2019 02:08   Dg Chest Port 1 View  Result Date: 01/27/2019 CLINICAL DATA:  ACUTE ON CHRONIC RESPIRATORY FAILURE PATIENT STATES " HE WAS HAVING SOME CHEST PAIN EARLIER BUT THE PAIN IS BETTER SINCE HE HAD A BREATHING TREATMENT EARLIER TODAY, STILL HAVING SOME CHEST PAIN, SOME SOB AND COUGH " HISTORY OF CH.*comment was truncated* EXAM: PORTABLE CHEST 1 VIEW COMPARISON:  Radiograph 01/24/2019 FINDINGS: Stable large cardiac silhouette. There is bilateral pleural effusions. There is bibasilar atelectasis. Interstitial lung disease at the lung  bases. Interstitial edema in the upper lobes. IMPRESSION: No significant change. Findings correspond to chronic interstitial lung disease and interstitial edema seen on comparison CT. Cardiomegaly. Electronically Signed   By: Suzy Bouchard M.D.   On: 01/27/2019 17:13   Dg Chest Portable 1 View  Result Date: 01/24/2019 CLINICAL DATA:  Leg swelling.  Shortness of breath.  Weight gain. EXAM: PORTABLE CHEST 1 VIEW COMPARISON:  06/12/2018 FINDINGS: Patient rotated left. Mild cardiomegaly. Atherosclerosis in the transverse aorta. Probable trace right pleural fluid. Thickening of the right minor fissure. Asymmetric pulmonary interstitial prominence, greater on the right. Mild bibasilar airspace disease. IMPRESSION: Mild cardiomegaly with asymmetric mild pulmonary edema, worse on the right. Bibasilar pulmonary opacities, favored to represent atelectasis or scar. No well-defined lobar consolidation. Aortic Atherosclerosis (ICD10-I70.0). Electronically Signed    By: Anthony Miyamoto M.D.   On: 01/24/2019 18:54     Assessment & Plan    1. Acute on Chronic Diastolic CHF Exacerbation - He presented with worsening lower extremity edema in the setting of a 9 pound weight gain with BNP elevated to 697 on admission and CXR consistent with CHF. - He has responded well to IV Lasix and is overall -4.1L thus far with weight having declined from 169 lbs on admission to 163 lbs today. He still has volume overload by examination but is approaching his dry weight of 160 - 161 lbs. Agree with titration of his PO regimen from Lasix 20mg  daily to 40mg  daily. Will need close follow-up and repeat BMET to assess renal function.  - reviewed the importance of sodium restriction with the patient.   2. CAD/Episodes of Chest Pain - s/p BMS to LAD in 2014 with repeat cath in 10/2016 showing patent stent and 80% distal LCx stenosis not suitable for PCI and medical management recommended. - He reports having episodes of chest discomfort at home consistent with stable angina and says this typically resolves with 1 SL NTG. No acute worsening of his symptoms over the past several weeks. - Initial HS Troponin 41 with repeat values of 39, 30, and 29. EKG without acute ischemic changes. Echo shows a preserved EF with no regional WMA.  - would not anticipate further ischemic testing at this time in the setting of his stable symptoms. Continue ASA (on 325 mg daily per [place]), Imdur, statin, and Lopressor. Imdur could be further titrated in the future if additional antianginal therapy is needed.   3. ILD/ Pulmonary HTN - on 3L [place] at baseline. Repeat CTA this admission shows basilar predominant fibrotic change and bronchiectasis consistent with interstitial lung disease. - will discuss any additional medication changes with Dr. Johnsie Cancel. Not an ideal candidate for advanced therapies in the setting of his advanced age. The patient is followed by Pulmonology at the [facility].   4. Elevated D-dimer - D-dimer  elevated to 1.62 this admission. CTA negative for PE but he does have residual lower extremity edema which could be secondary to his CHF but given his recent PE and no longer on anticoagulation, will check lower extremity dopplers to rule-out DVT.   5. HTN - BP stable at 107/56 - 152/78 within the past 24 hours. Continue current medication regimen.   6. Stage 3 CKD - baseline creatinine 1.2 - 1.3. At 1.45 on admission, stable at 1.39 this AM.    For questions or updates, please contact [place] Please consult [email or web address] for contact info under Cardiology/STEMI.  Signed, Erma Heritage, PA-C 01/28/2019, 8:08 AM Pager: [phone]

## 2019-01-28 NOTE — Evaluation (Signed)
Physical Therapy Evaluation Patient Details Name: Anthony Galvan MRN: [id number] DOB: 08-22-1921 Today's Date: 01/28/2019   History of Present Illness  Anthony Galvan  is a 85 y.o. male, w hypertension, hyperlipidemia, CKD stage3, CAD s/p PCI of mid LAD w bare metal statne [id number], CHF (diastolic), mild AR/MR,h/o PE, ILD w bronchiectasis,  pulmonary hypertension, presents with c/o dyspnea w exertion and lower extremity edema starting today. Pt doesn't think that he has gained weight.  Pt denies fever, chills, cough, cp, palp, orthopnea/ pnd.  pt states he is full code.    Clinical Impression  Patient limited for functional mobility as stated below secondary to BLE weakness, fatigue and impaired gait. Patient is most limited by impaired activity tolerance secondary to fatigue. Patient's SpO2 in seated was 93% on 2 LPM O2, upon standing SpO2 decreased to 88% while on 2 LPM O2 which increased to 91% upon standing for several minutes. Upon ambulating, O2 decreased to 82% on 2 LPM after ambulating 10 feet, patient returned to chair and O2 saturation increased back to 91% after sitting on 2 LPM. Patient left in chair on 3L LPM O2 secondary to SOB - RN notified.  Patient able to complete transfers to standing with RW with min assist.   Patient will benefit from continued physical therapy in hospital and recommended venue below to increase strength, balance, endurance for safe ADLs and gait.     Follow Up Recommendations SNF;Supervision/Assistance - 24 hour;Supervision for mobility/OOB    Equipment Recommendations  Rolling walker with 5" wheels    Recommendations for Other Services       Precautions / Restrictions Precautions Precautions: Fall Restrictions Weight Bearing Restrictions: No      Mobility  Bed Mobility Overal bed mobility: Modified Independent             General bed mobility comments: slower  Transfers Overall transfer level: Needs assistance Equipment used: Rolling  walker (2 wheeled) Transfers: Sit to/from Stand Sit to Stand: Min assist;Supervision         General transfer comment: from chair to standing using RW, min assist; SpO2 in seated 93% on 2 LPM, upon standing SpO2 decreased to 88%,  Ambulation/Gait Ambulation/Gait assistance: Min assist Gait Distance (Feet): 20 Feet Assistive device: Rolling walker (2 wheeled) Gait Pattern/deviations: Decreased step length - right;Decreased step length - left Gait velocity: decreased   General Gait Details: slow labored, became short of breath; O2 decreased to 82% on 2 LPM after ambulating 10 feet, patient returned to chair and O2 back to 91% on 2 LPM  Stairs            Wheelchair Mobility    Modified Rankin (Stroke Patients Only)       Balance Overall balance assessment: Needs assistance Sitting-balance support: Feet supported;No upper extremity supported Sitting balance-Leahy Scale: Good Sitting balance - Comments: seated in chair   Standing balance support: Bilateral upper extremity supported Standing balance-Leahy Scale: Fair Standing balance comment: using RW                             Pertinent Vitals/Pain      Home Living Family/patient expects to be discharged to:: Private residence Living Arrangements: Other (Comment)(grandson) Available Help at Discharge: Family;Available PRN/intermittently;Personal care attendant Type of Home: House Home Access: Ramped entrance     Home Layout: One level Home Equipment: Walker - 2 wheels;Shower seat;Hand held shower head;Electric scooter;Grab bars - tub/shower;Walker - 4 wheels;Wheelchair -  manual;Other (comment)(requires use of O2 at home - 3 LPM O2) Additional Comments: Patient lives with grandson who works, family able to help at points througout the day    Prior Function Level of Independence: Independent with assistive device(s);Needs assistance   Gait / Transfers Assistance Needed: able to complete with RW and  rest breaks, household ambulator  ADL's / Homemaking Assistance Needed: requires assistance with ADL  Comments: rollator and wheel chair for longer distances     Hand Dominance        Extremity/Trunk Assessment   Upper Extremity Assessment Upper Extremity Assessment: Overall WFL for tasks assessed    Lower Extremity Assessment Lower Extremity Assessment: Generalized weakness       Communication   Communication: No difficulties  Cognition Arousal/Alertness: Awake/alert Behavior During Therapy: WFL for tasks assessed/performed Overall Cognitive Status: Within Functional Limits for tasks assessed                                        General Comments      Exercises     Assessment/Plan    PT Assessment Patient needs continued PT services  PT Problem List Decreased strength;Decreased activity tolerance;Decreased balance;Decreased mobility       PT Treatment Interventions Gait training;Functional mobility training;Therapeutic activities;Therapeutic exercise;Balance training;Patient/family education    PT Goals (Current goals can be found in the Care Plan section)  Acute Rehab PT Goals Patient Stated Goal: return home PT Goal Formulation: With patient Time For Goal Achievement: 02/11/19 Potential to Achieve Goals: Good    Frequency Min 3X/week   Barriers to discharge        Co-evaluation               AM-PAC PT "6 Clicks" Mobility  Outcome Measure Help needed turning from your back to your side while in a flat bed without using bedrails?: A Little Help needed moving from lying on your back to sitting on the side of a flat bed without using bedrails?: A Little Help needed moving to and from a bed to a chair (including a wheelchair)?: A Little Help needed standing up from a chair using your arms (e.g., wheelchair or bedside chair)?: A Little Help needed to walk in hospital room?: A Lot Help needed climbing 3-5 steps with a railing? : A  Lot 6 Click Score: 16    End of Session Equipment Utilized During Treatment: Gait belt;Oxygen(2 LPM) Activity Tolerance: Patient tolerated treatment well;Patient limited by fatigue Patient left: in chair;with call bell/phone within reach Nurse Communication: Mobility status;Other (comment)(O2 sats throughout session) PT Visit Diagnosis: Unsteadiness on feet (R26.81);Repeated falls (R29.6);Muscle weakness (generalized) (M62.81);History of falling (Z91.81)    Time: [id number] PT Time Calculation (min) (ACUTE ONLY): 28 min   Charges:   PT Evaluation $PT Eval Low Complexity: 1 Low PT Treatments $Therapeutic Activity: 23-37 mins        10:27 AM, 01/28/19 Mearl Latin PT, DPT Physical Therapist at [facility]

## 2019-01-29 DIAGNOSIS — K219 Gastro-esophageal reflux disease without esophagitis: Secondary | ICD-10-CM

## 2019-01-29 LAB — BASIC METABOLIC PANEL
Anion gap: 11 (ref 5–15)
BUN: 23 mg/dL (ref 8–23)
CO2: 28 mmol/L (ref 22–32)
Calcium: 8.5 mg/dL — ABNORMAL LOW (ref 8.9–10.3)
Chloride: 97 mmol/L — ABNORMAL LOW (ref 98–111)
Creatinine, Ser: 1.41 mg/dL — ABNORMAL HIGH (ref 0.61–1.24)
GFR calc Af Amer: 49 mL/min — ABNORMAL LOW (ref >60)
GFR calc non Af Amer: 42 mL/min — ABNORMAL LOW (ref >60)
Glucose, Bld: 87 mg/dL (ref 70–99)
Potassium: 4.4 mmol/L (ref 3.5–5.1)
Sodium: 136 mmol/L (ref 135–145)

## 2019-01-29 MED ORDER — IPRATROPIUM-ALBUTEROL 0.5-2.5 (3) MG/3ML IN SOLN: 3.0000 mL | Freq: Three times a day (TID) | RESPIRATORY_TRACT | 2 refills | Status: AC

## 2019-01-29 MED ORDER — ALUM & MAG HYDROXIDE-SIMETH 200-200-20 MG/5ML PO SUSP: 30.0000 mL | ORAL | 0 refills | Status: AC | PRN

## 2019-01-29 MED ORDER — PANTOPRAZOLE SODIUM 40 MG PO TBEC: 40.0000 mg | DELAYED_RELEASE_TABLET | Freq: Every day | ORAL | 1 refills | Status: AC

## 2019-01-29 MED ORDER — ISOSORBIDE DINITRATE 20 MG PO TABS: 20.0000 mg | ORAL_TABLET | Freq: Three times a day (TID) | ORAL | 1 refills | Status: DC

## 2019-01-29 MED ORDER — FUROSEMIDE 40 MG PO TABS: 40.0000 mg | ORAL_TABLET | Freq: Every day | ORAL | 1 refills | Status: DC

## 2019-01-29 MED ORDER — NITROGLYCERIN 0.4 MG SL SUBL
0.4000 mg | SUBLINGUAL_TABLET | SUBLINGUAL | Status: DC | PRN
  Administered 2019-01-29 (×2): 0.4 mg via SUBLINGUAL
  Filled 2019-01-29: qty 1

## 2019-01-29 NOTE — TOC Transition Note (Signed)
Transition of Care [facility]) - CM/SW Discharge Note   Patient Details  Name: Anthony Galvan MRN: [id number] Date of Birth: [birth year]  Transition of [facility]) CM/SW Contact:  Kwaku Mostafa, Chauncey Reading, RN Phone Number: 01/29/2019, 1:31 PM   Clinical Narrative:   Patient discharging home today. Spoke with daughter Santiago Glad, who states patient has an aide and oxygen provided by [place]. He does not have any current home health services. Patient feels he does not need home health ordered at this time as he has an aide and also has a Special educational needs teacher who makes home visits and phone calls routinely.     Final next level of care: Home/Self Care Barriers to Discharge: No Barriers Identified   Patient Goals and CMS Choice Patient states their goals for this hospitalization and ongoing recovery are:: to go home.      Discharge Placement                       Discharge Plan and Services                                     Social Determinants of Health (SDOH) Interventions     Readmission Risk Interventions Readmission Risk Prevention Plan 01/28/2019  Transportation Screening Complete  HRI or [place] Complete  Social Work Consult for [place] Planning/Counseling Complete  Palliative Care Screening Not Complete  Medication Review Press photographer) Complete  Some recent data might be hidden

## 2019-01-29 NOTE — Progress Notes (Signed)
Pt called to complain of chest and neck pain at 0905. Upon assessment, pt states gradual onset of chest pain since eating breakfast. Rates pain 9/10, states moved up into his lower lip. Skin W&D, color appropriate, denies N/V or SOB. Central tele called and verified 1st degree AVB with rate 80-90.  VSS except SaO2 noted to be 85% and pt on Room air. Pt chronically on O2 at home, O2 started at 2lpm [place] and SaO2 immediately up to 93%. Pt also stated that chest pain improved within 3 minutes of starting O2, now rating 6/10. Dr. Wynetta Emery notified. Orders received. VSS.  0949 - First SL Nitro given for CP rated 6/10. 0957 - Second SL Nitro given, B/P 136/61, HR 88, Sao2 94%, chest pain rated 5/10. 1003 -  B/P 124/64, HR 92, SaO2 95%. Pt has belched and said that pain better, then stated that it was getting worse, rated 5.5/10. Pt repositioned in bed, cool washcloth given to back of neck. Pt states, chest pain "getting better". 1010 - Nursing student and instructor remain in room with patient. Pt states, "feeling a little better", still says CP is 5/10. 1030 - B/P 129/62, HR 86, Resp 16, SaO2 94% 2lpnm [place], pt sleeping soundly. Skin warm and dry, color WNL, no distress noted. 1043: Pt continues to sleep soundly, no distress noted. 1105: Continues to sleep soundly, skin warm and dry, 1st degree block 75bpm per tele. No distress noted. 1125: Pt continues to sleep soundly. Pt awakened when B/P taken. States, "Oh, I didn't know I fell asleep, I thought all of yall were still in here." B/P 115/57, HR 93 bpm, SaO2 93% 2lpm , resp 16/min and non-labored. Pt states he no longer has any chest pain, "I just don't feel like I'm back to my normal.", but he cannot explain what that means. No other complaints, pt states, "I'll just nap a little bit more."

## 2019-01-29 NOTE — Progress Notes (Signed)
Patient requested all four of his bed rails to be up, He states " I feel safer".

## 2019-01-29 NOTE — Progress Notes (Signed)
Discharge instructions given to patient and daughter at bedside. Patient and Daughter stated they understood the new orders. Patient left in a wheelchair, patient is stable.

## 2019-01-29 NOTE — Discharge Summary (Addendum)
Physician Discharge Summary  Anthony Galvan [id number] DOB: May 03, 1921 DOA: 01/24/2019  PCP: [name], MD Cardiologist: Roger Kill  Admit date: 01/24/2019 Discharge date: 01/29/2019  Admitted From:  Home  Disposition: Home with [place] (PT REFUSES SNF PLACEMENT)  Recommendations for Outpatient Follow-up:  Follow up with PCP in 1 weeks Follow up with cardiology next week as scheduled  Home Health: Resume prior  Discharge Condition: STABLE   CODE STATUS: FULL    Brief Hospitalization Summary: Please see all hospital notes, images, labs for full details of the hospitalization. Dr. Julianne Rice HPI:  Anthony Galvan  is a 85 y.o. male, w hypertension, hyperlipidemia, CKD stage3, CAD s/p PCI of mid LAD w bare metal statne [id number], CHF (diastolic), mild AR/MR,h/o PE, ILD w bronchiectasis,  pulmonary hypertension, presents with c/o dyspnea w exertion and lower extremity edema starting today. Pt doesn't think that he has gained weight.   Pt denies fever, chills, cough, cp, palp, orthopnea/ pnd.  pt states he is full code.    His daughter notes that he ate a whole bucket of [place] fried chicken   In ED,  T 97.7  P 69  R 18, Bp 169/78  Pox 100% on [place] Wt 77kg   CXR IMPRESSION: Mild cardiomegaly with asymmetric mild pulmonary edema, worse on the right.   Bibasilar pulmonary opacities, favored to represent atelectasis or scar. No well-defined lobar consolidation.   Wbc 8.4, Hgb 11.3, Plt 138 Na 141, K 4.4, Bun 28, Creatinine 1.45 Ast 28, Alt 18 BNP 697   Trop I pending covid 19- pending   Pt given lasix 40mg  iv x 1 in ED   Pt will be admitted for acute on chronic diastolic CHF.   Brief History:  85 year old male with a history of coronary artery disease (s/p BMS to LAD in 2014, cath in 10/2016 showing patent stent with 80% distal-LCx stenosis which was unfavorable for PCI and medical management was recommended), hypertension, hyperlipidemia, pulmonary embolus February  2020, bronchiectasis/interstitial lung disease, CKD stage III, pulmonary hypertension, chronic respiratory failure on 3 L presenting with 2-day history of worsening shortness of breath and 1 day of increasing lower extremity edema.  The patient states that he had gained 9 pounds over 1 day period with his weight of 169 pounds on 01/24/2019.  He had denied any fevers, chills, nausea, vomiting, diarrhea, cough, hemoptysis, abdominal pain, dysuria, hematuria.  He endorses compliance with his medications.  He does endorse some indiscretion with fluid intake, but has been compliant with his dietary restrictions. In the emergency department, the patient was afebrile hemodynamically stable saturating 98% on 3 L nasal cannula.  BNP 697, troponin 41>>>39.  BMP showed serum creatinine 1.45 with unremarkable LFTs.  CBC showed hemoglobin of 10.9.  Patient was admitted for diuresis and further work-up.   Assessment/Plan: Acute on chronic diastolic CHF/Cor Pulmonale -06/12/2018 echo EF 60-65%, impaired relaxation, mild decreased RV function, elevated RVSP 60.2 mmHg -Primarily cor pulmonale/right heart failure -Continue IV furosemide>>>po lasix on 10/13 -Daily weights--NEG 2.4 lbs -Accurate I's and O's--NEG 4.1 L -Repeat echo--EF 65-70%, mod-severe TR, RVSP 53.3 -personally reviewed EKG--sinus, no STT changes -may ultimately need Right heart cath -appreciate cardiology consult-->changed imdur to isordil   Chronic respiratory failure with hypoxia -The patient's dyspnea is multifactorial including CHF superimposed upon the patient's ILD/bronchiectasis -Presently stable on 3 L which is the patient's home requirement -01/25/2019 CTA chest--negative PE, basilar predominant fibrotic changes and bronchiectasis, no pleural effusion or consolidation, right heart enlargement with reflux of  contrast into the hepatic veins -check viral respiratory panel -repeated CXR--personally reviewed--no change to chronic interstitial  prominence   CKD stage III a -Baseline creatinine 1.1-1.4 -Monitor with diuresis   Interstitial lung disease/bronchiectasis -continue duo nebs -Patient follows with pulmonology at [facility] -repeat CXR personally reviewed--no change to chronic interstitial prominence -virus respiratory panel--neg   History of pulmonary embolus -Noted on CTA chest 06/13/2018--> started on apixaban -Was instructed to discontinue his apixaban 2 weeks prior to this admission -01/25/2019 CTA chest negative for PE -01/28/19--US legs--neg DVT   Coronary artery disease -s/p BMS to LAD in 2014, cath in 10/2016 showing patent stent with 80% distal-LCx stenosis which was unfavorable for PCI  -continue ASA   Thrombocytopenia -Secondary to chronic hepatic congestion -Serum 0000000 -Folic [id number] -improved with diuresis   Essential hypertension -Continue metoprolol, hydralazine and Isordil    Hyperlipidemia -Continue statin   Elevated troponin/Chest Pain -trend is flat -due to demand ischemia -no acute HS troponin changes - continue NTG as needed and scheduled isordil -repeat EKG--personally reviewed--sinus, no concerning STT changes -partly due to GI issue-->relieved with GI cocktail on 01/27/19 -continue protonix     Disposition Plan:   Home 01/29/19 if cleared by cards--refuses SNF Family Communication:  No Family at bedside   Consultants:  none   Code Status:  FULL    DVT Prophylaxis:   [place] Lovenox     Procedures: As Listed in Progress Note Above   Antibiotics: None  Discharge Diagnoses:  Active Problems:   Hyperlipidemia with target low density lipoprotein (LDL) cholesterol less than 70 mg/dL   Essential hypertension   GERD   CAD S/P percutaneous coronary angioplasty   CKD (chronic kidney disease) stage 3, GFR 30-59 ml/min (HCC)   Coronary artery disease involving native heart without angina pectoris   Acute on chronic diastolic CHF (congestive heart  failure) (HCC)   Pulmonary hypertension (HCC)   Acute CHF (congestive heart failure) (HCC)   Anemia   Thrombocytopenia (HCC)   Chronic respiratory failure with hypoxia (HCC)   Bronchiectasis (HCC)   Cor pulmonale (chronic) ([place])   Acute on chronic respiratory failure ([place])   Discharge Instructions:   Allergies as of 01/29/2019       Reactions   Lisinopril Other (See Comments)   "Allergic," per the [facility]- patient is unaware of any reaction (??)        Medication List     STOP taking these medications    albuterol (2.5 MG/3ML) 0.083% nebulizer solution Commonly known as: PROVENTIL   Eliquis DVT/PE Starter Pack 5 MG Tabs   isosorbide mononitrate 30 MG 24 hr tablet Commonly known as: IMDUR   Nebulizer Misc       TAKE these medications    acetaminophen 325 MG tablet Commonly known as: TYLENOL Take 2 tablets (650 mg total) by mouth 3 (three) times daily as needed (pain).   alum & mag hydroxide-simeth 200-200-20 MG/5ML suspension Commonly known as: MAALOX/MYLANTA Take 30 mLs by mouth every 4 (four) hours as needed for indigestion.   aspirin 325 MG tablet Take 1 tablet (325 mg total) by mouth daily.   atorvastatin 80 MG tablet Commonly known as: LIPITOR Take 1 tablet (80 mg total) by mouth daily at 6 PM.   calcium carbonate 1250 (500 Ca) MG tablet Commonly known as: OS-CAL - dosed in mg of elemental calcium Take 1 tablet (500 mg of elemental calcium total) by mouth daily with breakfast.   furosemide 40 MG tablet Commonly  known as: LASIX Take 1 tablet (40 mg total) by mouth daily. What changed:  medication strength how much to take   guaiFENesin 600 MG 12 hr tablet Commonly known as: MUCINEX Take 1 tablet (600 mg total) by mouth 2 (two) times daily.   ipratropium-albuterol 0.5-2.5 (3) MG/3ML Soln Commonly known as: DUONEB Take 3 mLs by nebulization 3 (three) times daily.   isosorbide dinitrate 20 MG tablet Commonly known as: ISORDIL Take 1 tablet  (20 mg total) by mouth 3 (three) times daily.   metoprolol tartrate 25 MG tablet Commonly known as: LOPRESSOR Take 0.5 tablets (12.5 mg total) by mouth 2 (two) times daily.   multivitamin with minerals Tabs tablet Take 1 tablet by mouth daily.   Nitrostat 0.4 MG SL tablet Generic drug: nitroGLYCERIN PLACE 1 TABLET UNDER TONGUE EVERY 5 MINUTES FOR 3 DOSES AS NEEDED.   pantoprazole 40 MG tablet Commonly known as: PROTONIX Take 1 tablet (40 mg total) by mouth daily. Start taking on: January 30, 2019   polyethylene glycol 17 g packet Commonly known as: MIRALAX / GLYCOLAX Take 17 g by mouth 2 (two) times daily.   Vitamin D (Ergocalciferol) 1.25 MG (50000 UT) Caps capsule Commonly known as: DRISDOL Take 1 capsule (50,000 Units total) by mouth every 7 (seven) days.       Follow-up Information     Erma Heritage, PA-C Follow up on 02/12/2019.   Specialties: Physician Assistant, Cardiology Why: Cardiology Hospital Follow-up on 02/12/2019 at 3:30 PM.  Contact information: [place] [address] [phone]         [name], MD. Schedule an appointment as soon as possible for a visit in 1 week(s).   Specialty: Internal Medicine Why: Hospital Follow Up Contact information: [place] [address] [phone]         Leonie Man, MD .   Specialty: Cardiology Contact information: [address] [place] [address] [place] [address] [phone]           Allergies  Allergen Reactions   Lisinopril Other (See Comments)    "Allergic," per the [facility]- patient is unaware of any reaction (??)   Allergies as of 01/29/2019       Reactions   Lisinopril Other (See Comments)   "Allergic," per the [facility]- patient is unaware of any reaction (??)        Medication List     STOP taking these medications    albuterol (2.5 MG/3ML) 0.083% nebulizer solution Commonly known as: PROVENTIL   Eliquis DVT/PE Starter  Pack 5 MG Tabs   isosorbide mononitrate 30 MG 24 hr tablet Commonly known as: IMDUR   Nebulizer Misc       TAKE these medications    acetaminophen 325 MG tablet Commonly known as: TYLENOL Take 2 tablets (650 mg total) by mouth 3 (three) times daily as needed (pain).   alum & mag hydroxide-simeth 200-200-20 MG/5ML suspension Commonly known as: MAALOX/MYLANTA Take 30 mLs by mouth every 4 (four) hours as needed for indigestion.   aspirin 325 MG tablet Take 1 tablet (325 mg total) by mouth daily.   atorvastatin 80 MG tablet Commonly known as: LIPITOR Take 1 tablet (80 mg total) by mouth daily at 6 PM.   calcium carbonate 1250 (500 Ca) MG tablet Commonly known as: OS-CAL - dosed in mg of elemental calcium Take 1 tablet (500 mg of elemental calcium total) by mouth daily with breakfast.   furosemide  40 MG tablet Commonly known as: LASIX Take 1 tablet (40 mg total) by mouth daily. What changed:  medication strength how much to take   guaiFENesin 600 MG 12 hr tablet Commonly known as: MUCINEX Take 1 tablet (600 mg total) by mouth 2 (two) times daily.   ipratropium-albuterol 0.5-2.5 (3) MG/3ML Soln Commonly known as: DUONEB Take 3 mLs by nebulization 3 (three) times daily.   isosorbide dinitrate 20 MG tablet Commonly known as: ISORDIL Take 1 tablet (20 mg total) by mouth 3 (three) times daily.   metoprolol tartrate 25 MG tablet Commonly known as: LOPRESSOR Take 0.5 tablets (12.5 mg total) by mouth 2 (two) times daily.   multivitamin with minerals Tabs tablet Take 1 tablet by mouth daily.   Nitrostat 0.4 MG SL tablet Generic drug: nitroGLYCERIN PLACE 1 TABLET UNDER TONGUE EVERY 5 MINUTES FOR 3 DOSES AS NEEDED.   pantoprazole 40 MG tablet Commonly known as: PROTONIX Take 1 tablet (40 mg total) by mouth daily. Start taking on: January 30, 2019   polyethylene glycol 17 g packet Commonly known as: MIRALAX / GLYCOLAX Take 17 g by mouth 2 (two) times daily.    Vitamin D (Ergocalciferol) 1.25 MG (50000 UT) Caps capsule Commonly known as: DRISDOL Take 1 capsule (50,000 Units total) by mouth every 7 (seven) days.        Procedures/Studies: Ct Angio Chest Pe W Or Wo Contrast  Result Date: 01/25/2019 CLINICAL DATA:  PE suspected, positive D-dimer, history of PE recently stopping Eliquis EXAM: CT ANGIOGRAPHY CHEST WITH CONTRAST TECHNIQUE: Multidetector CT imaging of the chest was performed using the standard protocol during bolus administration of intravenous contrast. Multiplanar CT image reconstructions and MIPs were obtained to evaluate the vascular anatomy. CONTRAST:  155mL OMNIPAQUE IOHEXOL 350 MG/ML SOLN COMPARISON:  Same day chest radiograph, CTA chest 06/13/2018 FINDINGS: Cardiovascular: Satisfactory opacification of the pulmonary arteries to the segmental level. No evidence of acute or residual pulmonary embolus. There is central pulmonary arterial enlargement. There is cardiomegaly with right heart enlargement and reflux of contrast into the hepatic veins. Atherosclerotic calcification of the coronary arteries. Calcifications are present upon the aortic valve leaflets. Atherosclerotic plaque within the normal caliber aorta. Normal branching of the aortic arch. Mediastinum/Nodes: Few prominent lymph nodes are again seen within the mediastinum and hila including a 9 mm left hilar node (6/38), 8 mm AP window lymph node (6/33), and 9 mm right paratracheal lymph node (6/30). Each of these nodes is similar to the comparison exam. No enlarging lymph nodes are evident. No acute tracheal or esophageal abnormality. Normal appearance of the thyroid gland and thoracic inlet. Lungs/Pleura: There is redemonstration of the basilar predominant fibrotic change and bronchiectasis. Question early development of stacked cyst/honeycomb cyst formation. Few scattered calcified granulomata are present. No acute superimposed consolidative process. No pneumothorax or effusion.  Upper Abdomen: 2.2 cm upper pole left renal cyst is noted. Upper abdomen is otherwise unremarkable. Musculoskeletal: No chest wall abnormality. Poor dentition with multiple carious lesions and periapical lucencies partially imaged on this exam. Review of the MIP images confirms the above findings. IMPRESSION: 1. No evidence of acute or residual pulmonary embolus. 2. Cardiomegaly with right heart enlargement and reflux of contrast into the hepatic veins, suggestive of right heart failure. Coronary atherosclerosis. 3. Basilar predominant fibrotic change and bronchiectasis, consistent with interstitial lung disease, findings are categorized as probable UIP per consensus guidelines: Diagnosis of Idiopathic Pulmonary Fibrosis: An Official ATS/ERS/JRS/ALAT Clinical Practice Guideline. [place],  Iss 5, [id number], Dec 16 2016. 4. Poor dentition with multiple carious lesions and periapical lucencies partially imaged on this exam. Correlate with dental examination. 5. Aortic Atherosclerosis (ICD10-I70.0). Electronically Signed   By: Lovena Le M.D.   On: 01/25/2019 02:08   US Venous Img Lower Bilateral  Result Date: 01/28/2019 CLINICAL DATA:  Bilateral lower extremity edema. History of pulmonary embolism. Evaluate for DVT. EXAM: BILATERAL LOWER EXTREMITY VENOUS DOPPLER ULTRASOUND TECHNIQUE: Gray-scale sonography with graded compression, as well as color Doppler and duplex ultrasound were performed to evaluate the lower extremity deep venous systems from the level of the common femoral vein and including the common femoral, femoral, profunda femoral, popliteal and calf veins including the posterior tibial, peroneal and gastrocnemius veins when visible. The superficial great saphenous vein was also interrogated. Spectral Doppler was utilized to evaluate flow at rest and with distal augmentation maneuvers in the common femoral, femoral and popliteal veins. COMPARISON:  None. FINDINGS: RIGHT LOWER  EXTREMITY Common Femoral Vein: No evidence of thrombus. Normal compressibility, respiratory phasicity and response to augmentation. Saphenofemoral Junction: No evidence of thrombus. Normal compressibility and flow on color Doppler imaging. Profunda Femoral Vein: No evidence of thrombus. Normal compressibility and flow on color Doppler imaging. Femoral Vein: No evidence of thrombus. Normal compressibility, respiratory phasicity and response to augmentation. Popliteal Vein: No evidence of thrombus. Normal compressibility, respiratory phasicity and response to augmentation. Calf Veins: No evidence of thrombus. Normal compressibility and flow on color Doppler imaging. Superficial Great Saphenous Vein: No evidence of thrombus. Normal compressibility. Venous Reflux:  None. Other Findings:  None. LEFT LOWER EXTREMITY Common Femoral Vein: No evidence of thrombus. Normal compressibility, respiratory phasicity and response to augmentation. Saphenofemoral Junction: No evidence of thrombus. Normal compressibility and flow on color Doppler imaging. Profunda Femoral Vein: No evidence of thrombus. Normal compressibility and flow on color Doppler imaging. Femoral Vein: No evidence of thrombus. Normal compressibility, respiratory phasicity and response to augmentation. Popliteal Vein: No evidence of thrombus. Normal compressibility, respiratory phasicity and response to augmentation. Calf Veins: No evidence of thrombus. Normal compressibility and flow on color Doppler imaging. Superficial Great Saphenous Vein: No evidence of thrombus. Normal compressibility. Venous Reflux:  None. Other Findings:  None. IMPRESSION: No evidence of DVT within either lower extremity. Electronically Signed   By: Sandi Mariscal M.D.   On: 01/28/2019 13:02   Dg Chest Port 1 View  Result Date: 01/27/2019 CLINICAL DATA:  ACUTE ON CHRONIC RESPIRATORY FAILURE PATIENT STATES " HE WAS HAVING SOME CHEST PAIN EARLIER BUT THE PAIN IS BETTER SINCE HE HAD A  BREATHING TREATMENT EARLIER TODAY, STILL HAVING SOME CHEST PAIN, SOME SOB AND COUGH " HISTORY OF CH.*comment was truncated* EXAM: PORTABLE CHEST 1 VIEW COMPARISON:  Radiograph 01/24/2019 FINDINGS: Stable large cardiac silhouette. There is bilateral pleural effusions. There is bibasilar atelectasis. Interstitial lung disease at the lung bases. Interstitial edema in the upper lobes. IMPRESSION: No significant change. Findings correspond to chronic interstitial lung disease and interstitial edema seen on comparison CT. Cardiomegaly. Electronically Signed   By: Suzy Bouchard M.D.   On: 01/27/2019 17:13   Dg Chest Portable 1 View  Result Date: 01/24/2019 CLINICAL DATA:  Leg swelling.  Shortness of breath.  Weight gain. EXAM: PORTABLE CHEST 1 VIEW COMPARISON:  06/12/2018 FINDINGS: Patient rotated left. Mild cardiomegaly. Atherosclerosis in the transverse aorta. Probable trace right pleural fluid. Thickening of the right minor fissure. Asymmetric pulmonary interstitial prominence, greater on the right. Mild bibasilar airspace disease. IMPRESSION: Mild cardiomegaly with asymmetric mild pulmonary  edema, worse on the right. Bibasilar pulmonary opacities, favored to represent atelectasis or scar. No well-defined lobar consolidation. Aortic Atherosclerosis (ICD10-I70.0). Electronically Signed   By: Abigail Miyamoto M.D.   On: 01/24/2019 18:54     Subjective: Pt feels better now, no symptoms, eating and drinking well, still refuses SNF placement.    Discharge Exam: Vitals:   01/29/19 0932 01/29/19 0947  BP: (!) 126/58 131/66  Pulse: 85 87  Resp: 16   Temp:    SpO2: 97%    Vitals:   01/29/19 0912 01/29/19 0919 01/29/19 0932 01/29/19 0947  BP: (!) 122/55  (!) 126/58 131/66  Pulse: 85 83 85 87  Resp: 18 16 16    Temp:      TempSrc:      SpO2: (!) 85% 96% 97%   Weight:      Height:       General: Pt is alert, awake, not in acute distress Cardiovascular: RRR, S1/S2 +, no rubs, no gallops Respiratory: CTA  bilaterally, no wheezing, no rhonchi Abdominal: Soft, NT, ND, bowel sounds + Extremities: no edema, no cyanosis   The results of significant diagnostics from this hospitalization (including imaging, microbiology, ancillary and laboratory) are listed below for reference.     Microbiology: Recent Results (from the past 240 hour(s))  SARS CORONAVIRUS 2 (TAT 6-24 HRS) Nasopharyngeal Nasopharyngeal Swab     Status: None   Collection Time: 01/24/19  6:54 PM   Specimen: Nasopharyngeal Swab  Result Value Ref Range Status   SARS Coronavirus 2 NEGATIVE NEGATIVE Final    Comment: (NOTE) SARS-CoV-2 target nucleic acids are NOT DETECTED. The SARS-CoV-2 RNA is generally detectable in upper and lower respiratory specimens during the acute phase of infection. Negative results do not preclude SARS-CoV-2 infection, do not rule out co-infections with other pathogens, and should not be used as the sole basis for treatment or other patient management decisions. Negative results must be combined with clinical observations, patient history, and epidemiological information. The expected result is Negative. Fact Sheet for Patients: SugarRoll.be Fact Sheet for Healthcare Providers: https://www.woods-mathews.com/ This test is not yet approved or cleared by the [place] FDA and  has been authorized for detection and/or diagnosis of SARS-CoV-2 by FDA under an Emergency Use Authorization (EUA). This EUA will remain  in effect (meaning this test can be used) for the duration of the COVID-19 declaration under Section 56 4(b)(1) of the Act, 21 U.S.C. section 360bbb-3(b)(1), unless the authorization is terminated or revoked sooner. Performed at [facility], [place] [address], [place], [place] [address]   Respiratory Panel by PCR     Status: None   Collection Time: 01/27/19  2:53 PM   Specimen: Nasopharyngeal Swab; Respiratory  Result Value Ref Range Status    Adenovirus NOT DETECTED NOT DETECTED Final   Coronavirus 229E NOT DETECTED NOT DETECTED Final    Comment: (NOTE) The Coronavirus on the Respiratory Panel, DOES NOT test for the novel  Coronavirus (2019 nCoV)    Coronavirus HKU1 NOT DETECTED NOT DETECTED Final   Coronavirus NL63 NOT DETECTED NOT DETECTED Final   Coronavirus OC43 NOT DETECTED NOT DETECTED Final   Metapneumovirus NOT DETECTED NOT DETECTED Final   Rhinovirus / Enterovirus NOT DETECTED NOT DETECTED Final   Influenza A NOT DETECTED NOT DETECTED Final   Influenza B NOT DETECTED NOT DETECTED Final   Parainfluenza Virus 1 NOT DETECTED NOT DETECTED Final   Parainfluenza Virus 2 NOT DETECTED NOT DETECTED Final   Parainfluenza Virus 3  NOT DETECTED NOT DETECTED Final   Parainfluenza Virus 4 NOT DETECTED NOT DETECTED Final   Respiratory Syncytial Virus NOT DETECTED NOT DETECTED Final   Bordetella pertussis NOT DETECTED NOT DETECTED Final   Chlamydophila pneumoniae NOT DETECTED NOT DETECTED Final   Mycoplasma pneumoniae NOT DETECTED NOT DETECTED Final    Comment: Performed at [facility], [place] [address] [place], [place] [address]     Labs: BNP (last 3 results) Recent Labs    06/12/18 0917 01/24/19 1818 01/28/19 0406  BNP 431.0* 697.0* [id number]*   Basic Metabolic Panel: Recent Labs  Lab 01/25/19 0352 01/26/19 0601 01/27/19 0553 01/28/19 0406 01/29/19 0412  NA 137 139 137 137 136  K 3.8 4.3 4.4 4.5 4.4  CL 101 98 97* 96* 97*  CO2 28 30 29 30 28   GLUCOSE 79 81 86 90 87  BUN 26* 25* 26* 22 23  CREATININE 1.33* 1.40* 1.51* 1.39* 1.41*  CALCIUM 8.5* 8.5* 8.5* 8.6* 8.5*  MG  --  2.1  --   --   --    Liver Function Tests: Recent Labs  Lab 01/24/19 1817 01/25/19 0352  AST 28 23  ALT 18 16  ALKPHOS 55 52  BILITOT 1.3* 1.4*  PROT 6.8 6.7  ALBUMIN 3.6 3.5   No results for input(s): LIPASE, AMYLASE in the last 168 hours. No results for input(s): AMMONIA in the last 168 hours. CBC: Recent Labs  Lab  01/24/19 1817 01/25/19 0352 01/28/19 0406  WBC 8.4 8.9 9.6  NEUTROABS 6.1  --   --   HGB 11.3* 10.9* 11.2*  HCT 39.0 37.4* 38.8*  MCV 96.8 95.2 95.8  PLT 138* 156 170   Cardiac Enzymes: No results for input(s): CKTOTAL, CKMB, CKMBINDEX, TROPONINI in the last 168 hours. BNP: Invalid input(s): POCBNP CBG: No results for input(s): GLUCAP in the last 168 hours. D-Dimer No results for input(s): DDIMER in the last 72 hours. Hgb A1c No results for input(s): HGBA1C in the last 72 hours. Lipid Profile No results for input(s): CHOL, HDL, LDLCALC, TRIG, CHOLHDL, LDLDIRECT in the last 72 hours. Thyroid function studies No results for input(s): TSH, T4TOTAL, T3FREE, THYROIDAB in the last 72 hours.  Invalid input(s): FREET3 Anemia work up No results for input(s): VITAMINB12, FOLATE, FERRITIN, TIBC, IRON, RETICCTPCT in the last 72 hours. Urinalysis    Component Value Date/Time   COLORURINE STRAW (A) 06/12/2018 1142   APPEARANCEUR CLEAR 06/12/2018 1142   APPEARANCEUR Clear 01/18/2017 1554   LABSPEC 1.006 06/12/2018 1142   PHURINE 6.0 06/12/2018 1142   GLUCOSEU NEGATIVE 06/12/2018 1142   HGBUR NEGATIVE 06/12/2018 1142   BILIRUBINUR NEGATIVE 06/12/2018 1142   BILIRUBINUR Negative 01/18/2017 1554   KETONESUR NEGATIVE 06/12/2018 1142   PROTEINUR NEGATIVE 06/12/2018 1142   UROBILINOGEN 1.0 08/27/2009 0830   NITRITE NEGATIVE 06/12/2018 1142   LEUKOCYTESUR NEGATIVE 06/12/2018 1142   Sepsis Labs Invalid input(s): PROCALCITONIN,  WBC,  LACTICIDVEN Microbiology Recent Results (from the past 240 hour(s))  SARS CORONAVIRUS 2 (TAT 6-24 HRS) Nasopharyngeal Nasopharyngeal Swab     Status: None   Collection Time: 01/24/19  6:54 PM   Specimen: Nasopharyngeal Swab  Result Value Ref Range Status   SARS Coronavirus 2 NEGATIVE NEGATIVE Final    Comment: (NOTE) SARS-CoV-2 target nucleic acids are NOT DETECTED. The SARS-CoV-2 RNA is generally detectable in upper and lower respiratory specimens  during the acute phase of infection. Negative results do not preclude SARS-CoV-2 infection, do not rule out co-infections with other pathogens,  and should not be used as the sole basis for treatment or other patient management decisions. Negative results must be combined with clinical observations, patient history, and epidemiological information. The expected result is Negative. Fact Sheet for Patients: SugarRoll.be Fact Sheet for Healthcare Providers: https://www.woods-mathews.com/ This test is not yet approved or cleared by the [place] FDA and  has been authorized for detection and/or diagnosis of SARS-CoV-2 by FDA under an Emergency Use Authorization (EUA). This EUA will remain  in effect (meaning this test can be used) for the duration of the COVID-19 declaration under Section 56 4(b)(1) of the Act, 21 U.S.C. section 360bbb-3(b)(1), unless the authorization is terminated or revoked sooner. Performed at [facility], [place] [address], [place], [place] [address]   Respiratory Panel by PCR     Status: None   Collection Time: 01/27/19  2:53 PM   Specimen: Nasopharyngeal Swab; Respiratory  Result Value Ref Range Status   Adenovirus NOT DETECTED NOT DETECTED Final   Coronavirus 229E NOT DETECTED NOT DETECTED Final    Comment: (NOTE) The Coronavirus on the Respiratory Panel, DOES NOT test for the novel  Coronavirus (2019 nCoV)    Coronavirus HKU1 NOT DETECTED NOT DETECTED Final   Coronavirus NL63 NOT DETECTED NOT DETECTED Final   Coronavirus OC43 NOT DETECTED NOT DETECTED Final   Metapneumovirus NOT DETECTED NOT DETECTED Final   Rhinovirus / Enterovirus NOT DETECTED NOT DETECTED Final   Influenza A NOT DETECTED NOT DETECTED Final   Influenza B NOT DETECTED NOT DETECTED Final   Parainfluenza Virus 1 NOT DETECTED NOT DETECTED Final   Parainfluenza Virus 2 NOT DETECTED NOT DETECTED Final   Parainfluenza Virus 3 NOT DETECTED NOT  DETECTED Final   Parainfluenza Virus 4 NOT DETECTED NOT DETECTED Final   Respiratory Syncytial Virus NOT DETECTED NOT DETECTED Final   Bordetella pertussis NOT DETECTED NOT DETECTED Final   Chlamydophila pneumoniae NOT DETECTED NOT DETECTED Final   Mycoplasma pneumoniae NOT DETECTED NOT DETECTED Final    Comment: Performed at [facility], [place], [place],  [address]   Time coordinating discharge: 33 mins  SIGNED:  Irwin Brakeman, MD  [facility] 01/29/2019, 12:26 PM How to contact the [facility] Attending or Consulting provider [place] or covering provider during after hours [place], for this patient?  Check the care team in [facility] and look for a) attending/consulting TR[facility] provider listed and b) the [facility] team listed Log into [email or web address] and use [place]'s universal password to access. If you do not have the password, please contact the hospital operator. Locate the [facility] provider you are looking for under [facility] and page to a number that you can be directly reached. If you still have difficulty reaching the provider, please page the [facility] (Director on Call) for the Hospitalists listed on amion for assistance.

## 2019-01-29 NOTE — Care Management Important Message (Signed)
Important Message  Patient Details  Name: Anthony Galvan MRN: [id number] Date of Birth: Sep 07, 1921   Medicare Important Message Given:  Yes     [name] 01/29/2019, 2:50 PM

## 2019-01-29 NOTE — Progress Notes (Signed)
Pt requested cough medication. States chest still feeling sore, Tylenol offered and accepted. Pt with congested cough, productive, clear-yellow phlegm. Has congestion noted right upper chest, doesn't clear with cough.  Talked with pts daughter Santiago Glad regarding patient's episode of chest pain earlier this am. Advised that he was treated for CP and that vital signs were stable, O2 continued and that patient has been sleeping most of morning with no new complaints. Also advised that MD had written discharge orders for patient. She states she will call me back when she is ready to pick him up.

## 2019-01-29 NOTE — TOC Transition Note (Signed)
Transition of Care [facility]) - CM/SW Discharge Note   Patient Details  Name: MARTON BRAATZ MRN: [id number] Date of Birth: October 18, 1921  Transition of Care [facility]) CM/SW Contact:  Inda Mcglothen, Chauncey [place], RN Phone Number: 01/29/2019, 1:33 PM     Readmission Risk Interventions Readmission Risk Prevention Plan 01/29/2019 01/28/2019  Transportation Screening - Complete  PCP or Specialist Appt within 3-5 Days Complete -  HRI or [place] - Complete  Social Work Consult for [place] Planning/Counseling - Complete  Palliative Care Screening - Not Complete  Medication Review [employer]) - Complete  Some recent data might be hidden

## 2019-02-05 ENCOUNTER — Other Ambulatory Visit: Payer: Self-pay | Admitting: *Deleted

## 2019-02-05 NOTE — Patient Outreach (Signed)
[place] [facility]) Care Management  02/05/2019  Anthony Galvan 12-06-1921 [id number]    EMMI-GENERAL DISCHARGE RED ON EMMI ALERT Day # 4 Date:02/04/2019 Red Alert Reason: Lost interest in things/questions and problems  Outreach #1 RN attempted outreach call today however unsuccessful. RN able to leave a HIPAA approved voice message requesting a call back.  Will make another outreach call over the next few days to inquire further on pt's EMMI FLAG.  Raina Mina, RN Care Management Coordinator [place] Office [phone]

## 2019-02-11 ENCOUNTER — Other Ambulatory Visit: Payer: Self-pay | Admitting: *Deleted

## 2019-02-11 NOTE — Patient Outreach (Signed)
[place] [facility]) Care Management  02/11/2019  Anthony Galvan Dec 16, 1921 [id number]    EMMI-GENERAL DISCHARGE RED ON EMMI ALERT Day #4 Date:02/04/2019 Red Alert Reason:Lost interest in things, questions and problems.  Outreach #2 RN attempted once again an outreach call to both the home and mobile numbers. RN only able to leave a HIPAA approved voice message requesting a call back. Will rescheduled another outreach call over the next week for pending services.   Raina Mina, RN Care Management Coordinator [place] Office [phone]

## 2019-02-11 NOTE — Progress Notes (Signed)
Cardiology Office Note    Date:  02/12/2019   ID:  Anthony Galvan, DOB 1921-08-26, MRN [id number]  PCP:  [name], MD  Cardiologist: Glenetta Hew, MD  --> wishes to switch to MD in [facility] due to transportation isses  Chief Complaint  Patient presents with   Hospitalization Follow-up    History of Present Illness:    Anthony Galvan is a 85 y.o. male with past medical history of CAD (s/p BMS to LAD in 2014, repeat cath in 10/2016 showing patent stent and 80% distal LCx stenosis not suitable for PCI and medical management recommended), ILD (on 3L [place] at baseline), acute PE (diagnosed in 05/2018 - no longer on anticoagulation), HTN, HLD, GERD and Stage 3 CKD who presents to the office today for hospital follow-up.  He was most recently admitted to [facility] on 01/24/2019 for evaluation of worsening dyspnea and lower extremity edema in the setting of a 9 pound weight gain. Troponin values were negative but BNP was elevated to 697 and D-dimer was elevated to 1.62. CTA showed no evidence of an acute or residual PE. Lower extremity dopplers were negative for a DVT. A repeat echocardiogram was obtained which showed a preserved EF of 65 to 70% with elevated LVEDP along with moderate to severe TR. He responded well to IV Lasix and was transitioned to PO Lasix 40 mg daily at the time of discharge (was on 20mg  daily prior to admission). Discharge weight was not recorded but this had declined to 163 lbs on 01/28/2019 with creatinine stable at 1.41 on 10/14. He was also transitioned from Imdur to Isordil 20mg  TID in the setting of elevated PA pressures.   In talking with the patient today, he reports significant improvement in his symptoms following hospital discharge.  He does have baseline dyspnea on exertion in setting of interstitial lung disease but reports that his breathing has overall been stable.  Denies any specific orthopnea or PND.  Lower extremity edema has significantly  improved.  He has been following his weights and says this has been stable on his home scales as well.  He describes one episode of chest discomfort since discharge which occurred at rest and resolved with the use of SL NTGx1.  No recurrent episodes since.  Past Medical History:  Diagnosis Date   Bronchiectasis ([place]) 06/13/2018   CAD S/P percutaneous coronary angioplasty 08/01/2012   PCI of mid LAD with a VeriFlex Bare Metal Stent 3.0 mm x 12 mm - post-dilated to 3.5 mm.;  Catheterization in July 2018 showed with severe heavily calcified distal circumflex-left PDA stenosis.  Not favorable for PCI.   Congestive heart failure (CHF) ([place])    Duodenal ulcer May 2011   on EGD   GERD (gastroesophageal reflux disease)    Helicobacter pylori gastritis MAY 2012 HP STOOL AG NEG   HTN (hypertension)    Hyperlipidemia    ILD (interstitial lung disease) ([place]) 06/13/2018   MI (myocardial infarction) ([place]) 1976, 1980, [place] [facility] at [facility] (Dr. Iona Beard)   Osteoarthritis    Pulmonary embolism ([place]) 06/13/2018   Pulmonary hypertension ([place]) 10/21/2016   S/P colonoscopy 2011   normal, internal hemorrhoids    Past Surgical History:  Procedure Laterality Date   CARDIAC CATHETERIZATION  01/30/2007   D1 75 %, mid LAD 50%, 50-70% circumflex, 50-70% RCA.(Dr. Adora Fridge)   CARDIAC CATHETERIZATION  12/11/2001   normal L main, small RCA, dominant LL Cfx  with mild diffuse disease, LAD with mid 10-20% stenosis, ramus intermedius with mild diffuse disease (Dr. Jackie Plum)   CATARACT EXTRACTION     COLONOSCOPY  OCT 2011 ARS   [facility]   in setting of MI   ESOPHAGOGASTRODUODENOSCOPY N/A 10/09/2016   Procedure: ESOPHAGOGASTRODUODENOSCOPY (EGD);  Surgeon: Danie Binder, MD;  [facility];  Service: Endoscopy;  Laterality: N/A;   FEMUR IM NAIL Right 12/01/2016   Procedure: INTRAMEDULLARY (IM) NAIL RIGHT HIP;  Surgeon: Altamese Crookston, MD;  Location: [facility];  Service: Orthopedics;  Laterality: Right;   FEMUR IM NAIL Left 05/07/2018   Procedure: INTRAMEDULLARY (IM) NAIL FEMORAL;  Surgeon: Nicholes Stairs, MD;  Location: [facility];  Service: Orthopedics;  Laterality: Left;   LEFT HEART CATH AND CORONARY ANGIOGRAPHY N/A 10/23/2016   Procedure: Left Heart Cath and Coronary Angiography;  Surgeon: Sherren Mocha, MD;  Location: [facility] CV [facility];  Service: Cardiovascular: Two-vessel CAD (left dominant).  Patent BMS in p LAD.  Severe, heavily calcified dCx-LPDA lesion.  Not favorable for PCI.   LEFT HEART CATHETERIZATION WITH CORONARY ANGIOGRAM N/A 08/01/2012   Procedure: LEFT HEART CATHETERIZATION WITH CORONARY ANGIOGRAM;  Surgeon: Leonie Man, MD;  Location: [facility] CATH LAB: Mid LAD 99% apple core; D2 ostial 70-80% (2 small for PCI) small nondominant RCA 60-70%. Distal circumflex/L PDA ~50% --> PCI of LAD   NM MYOCAR PERF WALL MOTION  08/2003   adenosine stress - focal decreased perfusion defect in distal inferior wall, no significant ischemic changes   PERCUTANEOUS STENT INTERVENTION  08/01/2012   Procedure: PERCUTANEOUS STENT INTERVENTION;  Surgeon: Leonie Man, MD;  Location: [facility] CATH LAB; ;PCI of mid LAD with a VeriFlex Bare Metal Stent 3.0 mm x 12 mm - post-dilated to 3.5 mm.   TRANSTHORACIC ECHOCARDIOGRAM  10/2016   mild LVH - 60-65%. Gr 1 DD. No RWMA. Mild MR. Mod TR. Mild RA dilation.  PAP ~ 60 mmHg.   UPPER GASTROINTESTINAL ENDOSCOPY  08/2009   MELENA, HEMATEMESIS --> DUODENAL ULCER, Bx: H PYLORI POS    Current Medications: Outpatient Medications Prior to Visit  Medication Sig Dispense Refill   acetaminophen (TYLENOL) 325 MG tablet Take 2 tablets (650 mg total) by mouth 3 (three) times daily as needed (pain).     alum & mag hydroxide-simeth (MAALOX/MYLANTA) 200-200-20 MG/5ML suspension Take 30 mLs by mouth every 4 (four) hours as needed for indigestion. 355 mL 0   aspirin 325 MG tablet Take 1 tablet  (325 mg total) by mouth daily. 30 tablet 0   atorvastatin (LIPITOR) 80 MG tablet Take 1 tablet (80 mg total) by mouth daily at 6 PM. 30 tablet 5   calcium carbonate (OS-CAL - DOSED IN MG OF ELEMENTAL CALCIUM) 1250 (500 Ca) MG tablet Take 1 tablet (500 mg of elemental calcium total) by mouth daily with breakfast.     furosemide (LASIX) 40 MG tablet Take 1 tablet (40 mg total) by mouth daily. 30 tablet 1   guaiFENesin (MUCINEX) 600 MG 12 hr tablet Take 1 tablet (600 mg total) by mouth 2 (two) times daily. 30 tablet 0   ipratropium-albuterol (DUONEB) 0.5-2.5 (3) MG/3ML SOLN Take 3 mLs by nebulization 3 (three) times daily. 360 mL 2   isosorbide dinitrate (ISORDIL) 20 MG tablet Take 1 tablet (20 mg total) by mouth 3 (three) times daily. 90 tablet 1   metoprolol tartrate (LOPRESSOR) 25 MG tablet Take 0.5 tablets (12.5 mg total)  by mouth 2 (two) times daily. 30 tablet 0   Multiple Vitamin (MULTIVITAMIN WITH MINERALS) TABS tablet Take 1 tablet by mouth daily.     NITROSTAT 0.4 MG SL tablet PLACE 1 TABLET UNDER TONGUE EVERY 5 MINUTES FOR 3 DOSES AS NEEDED. 25 tablet 6   pantoprazole (PROTONIX) 40 MG tablet Take 1 tablet (40 mg total) by mouth daily. 30 tablet 1   polyethylene glycol (MIRALAX / GLYCOLAX) packet Take 17 g by mouth 2 (two) times daily. 14 each 0   Vitamin D, Ergocalciferol, (DRISDOL) 1.25 MG (50000 UT) CAPS capsule Take 1 capsule (50,000 Units total) by mouth every 7 (seven) days. 5 capsule    No facility-administered medications prior to visit.      Allergies:   Lisinopril   Social History   Socioeconomic History   Marital status: Widowed    Spouse name: Not on file   Number of children: 3   Years of education: Not on file   Highest education level: Not on file  Occupational History   Not on file  Social Needs   Financial resource strain: Not on file   Food insecurity    Worry: Not on file    Inability: Not on file   Transportation needs    Medical: Not  on file    Non-medical: Not on file  Tobacco Use   Smoking status: Never Smoker   Smokeless tobacco: Never Used  Substance and Sexual Activity   Alcohol use: No   Drug use: No   Sexual activity: Not on file  Lifestyle   Physical activity    Days per week: Not on file    Minutes per session: Not on file   Stress: Not on file  Relationships   Social connections    Talks on phone: Not on file    Gets together: Not on file    Attends religious service: Not on file    Active member of club or organization: Not on file    Attends meetings of clubs or organizations: Not on file    Relationship status: Not on file  Other Topics Concern   Not on file  Social History Narrative   Is a father of 3 with 3 grandchildren 3 great-grandchildren. He lives with one of his 2 grandsons.   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 He was a war 2 veteran in the [place]. 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   He never smoked. He does not drink alcohol.   He is very active, and a majority. He walks 30+ minutes every day. He also likes to play golf.     Family History:  The patient's family history includes Heart disease in his mother and sister; Heart disease (age of onset: 34) in his brother; Kidney disease in his father.   Review of Systems:   Please see the history of present illness.     General:  No chills, fever, night sweats or weight changes.  Cardiovascular:  No edema, orthopnea, palpitations, paroxysmal nocturnal dyspnea. Positive for dyspnea on exertion.  Dermatological: No rash, lesions/masses Respiratory: No cough, dyspnea Urologic: No hematuria, dysuria Abdominal:   No nausea, vomiting, diarrhea, bright red blood per rectum, melena, or hematemesis Neurologic:  No visual changes, wkns, changes in mental status. All other systems reviewed and are  otherwise negative  except as noted above.   Physical Exam:    VS:  BP 138/84    Pulse 62    Temp 97.7 F (36.5 C) (Temporal)    Ht 5\' 4"  (1.626 m)    Wt 160 lb (72.6 kg)    SpO2 94%    BMI 27.46 kg/m    General: Well developed, well nourished,male appearing in no acute distress. Head: Normocephalic, atraumatic, sclera non-icteric, no xanthomas, nares are without discharge.  Neck: No carotid bruits. JVD not elevated.  Lungs: Respirations regular and unlabored, without wheezes or rales. On 3L [place].  Heart: Regular rate and rhythm. No S3 or S4.  No murmur, no rubs, or gallops appreciated. Abdomen: Soft, non-tender, non-distended with normoactive bowel sounds. No hepatomegaly. No rebound/guarding. No obvious abdominal masses. Msk:  Strength and tone appear normal for age. No joint deformities or effusions. Extremities: No clubbing or cyanosis. Trace ankle edema bilaterally.  Distal pedal pulses are 2+ bilaterally. Neuro: Alert and oriented X 3. Moves all extremities spontaneously. No focal deficits noted. Psych:  Responds to questions appropriately with a normal affect. Skin: No rashes or lesions noted  Wt Readings from Last 3 Encounters:  02/12/19 160 lb (72.6 kg)  01/29/19 164 lb 10.9 oz (74.7 kg)  06/17/18 161 lb 6 oz (73.2 kg)     Studies/Labs Reviewed:   EKG:  EKG is not ordered today.   Recent Labs: 01/25/2019: ALT 16 01/26/2019: Magnesium 2.1 01/28/2019: B Natriuretic Peptide 635.0; Hemoglobin 11.2; Platelets 170 01/29/2019: BUN 23; Creatinine, Ser 1.41; Potassium 4.4; Sodium 136   Lipid Panel    Component Value Date/Time   CHOL 119 10/21/2016 0423   TRIG 87 10/21/2016 0423   HDL 57 10/21/2016 0423   CHOLHDL 2.1 10/21/2016 0423   VLDL 17 10/21/2016 0423   LDLCALC 45 10/21/2016 0423    Additional studies/ records that were reviewed today include:   Echocardiogram: 01/25/2019 IMPRESSIONS    1. Left ventricular ejection fraction, by visual estimation, is 65 to 70%.  The left ventricle has normal function. Normal left ventricular size. There is no left ventricular hypertrophy.  2. Elevated left ventricular end-diastolic pressure.  3. Left ventricular diastolic Doppler parameters are consistent with impaired relaxation pattern of LV diastolic filling.  4. Global right ventricle has normal systolic function.The right ventricular size is moderately enlarged. No increase in right ventricular wall thickness.  5. Left atrial size was normal.  6. Right atrial size was normal.  7. The mitral valve is normal in structure. No evidence of mitral valve regurgitation. No evidence of mitral stenosis.  8. The tricuspid valve is normal in structure. Tricuspid valve regurgitation moderate-severe.  9. The aortic valve is normal in structure. Aortic valve regurgitation is mild by color flow Doppler. Mild to moderate aortic valve sclerosis/calcification without any evidence of aortic stenosis. 10. The pulmonic valve was normal in structure. Pulmonic valve regurgitation is not visualized by color flow Doppler. 11. Moderately elevated pulmonary artery systolic pressure. 12. The tricuspid regurgitant velocity is 3.29 m/s, and with an assumed right atrial pressure of 10 mmHg, the estimated right ventricular systolic pressure is moderately elevated at 53.3 mmHg. 13. The inferior vena cava is normal in size with greater than 50% respiratory variability, suggesting right atrial pressure of 3 mmHg.  Assessment:    1. Coronary artery disease involving native coronary artery of native heart without angina pectoris   2. Chronic diastolic heart failure ([place])   3. Pulmonary hypertension ([place])   4. Essential hypertension  5. Hyperlipidemia LDL goal <70   6. Stage 3 chronic kidney disease, unspecified whether stage 3a or 3b CKD      Plan:   In order of problems listed above:  1. CAD - s/p BMS to LAD in 2014 with repeat catheterization in 10/2016 showing patent stent and 80% distal  LCx stenosis not suitable for PCI and medical management recommended. - Troponin values were flat during recent admission and echocardiogram showed a preserved EF with no wall motion abnormalities. He reports only one episode of chest discomfort since hospital discharge which resolved with the use of SL NTGx1.  - Will continue with medical management at this time. Remains on ASA (325mg  daily per [facility]), BB, statin, and nitrates. I reviewed with the patient that if he continues to experience recurrent pain to make [place] aware as Isordil could be further titrated.   2. Chronic Diastolic CHF - He reports significant improvement in his dyspnea and lower extremity edema since hospital discharge.  Weight was 163 lbs at discharge, at 160 lbs on the office scales today.  - He is currently on Lasix 40 mg daily. Will request a copy of blood work from the [place] as he reports having this drawn earlier this week.  3. Pulmonary HTN - Noted to have elevated pulmonary pressures by echocardiogram during recent admission. Also had moderate to severe TR. Suspect his pulmonary hypertension is secondary to known interstitial lung disease. - Remains on 3 L nasal cannula. He was started on Isordil during recent admission and has overall tolerated this well (was previously on Imdur).   4. HTN - BP is well controlled at 138/84 during today's visit. Continue current medication regimen.  5. HLD - Followed by PCP. Will request a copy of most recent labs as outlined above. He remains on Atorvastatin 80 mg daily with goal LDL less than 70 in the setting of known CAD.  6. Stage 3 CKD - Baseline creatinine of 1.3 - 1.4. Peaked at 1.51 during admission, improved to 1.41 at discharge. Will request a copy of recent labs from the [place].    He wishes to switch from Dr. Ellyn Hack to an MD in [place], [place] given transportation issues.    Medication Adjustments/Labs and Tests Ordered: Current medicines are reviewed at length with the  patient today.  Concerns regarding medicines are outlined above.  Medication changes, Labs and Tests ordered today are listed in the Patient Instructions below. Patient Instructions  Medication Instructions:  Your physician recommends that you continue on your current medications as directed. Please refer to the Current Medication list given to you today.  *If you need a refill on your cardiac medications before your next appointment, please call your pharmacy*  Lab Work: NONE  If you have labs (blood work) drawn today and your tests are completely normal, you will receive your results only by:  [place] (if you have MyChart) OR  A paper copy in the mail If you have any lab test that is abnormal or we need to change your treatment, we will call you to review the results.  Testing/Procedures: NONE   Follow-Up: At [facility], you and your health needs are our priority.  As part of our continuing mission to provide you with exceptional heart care, we have created designated Provider Care Teams.  These Care Teams include your primary Cardiologist (physician) and Advanced Practice Providers (APPs -  Physician Assistants and Nurse Practitioners) who all work together to provide you with the care  you need, when you need it.  Your next appointment:   3 months  The format for your next appointment:   In Person  Provider:   Rozann Lesches, MD  Other Instructions Thank you for choosing [facility]!    Limit daily fluid intake to less than 2 Liters per day. Please limit salt intake.  Please weight yourself every morning. Take an extra Lasix tablet if weight increases by 3 pounds overnight or 5 pounds in a single week.     Signed, Erma Heritage, PA-C  02/12/2019 6:02 PM    [facility] [address] [place], [place] [address] Phone: [phone] Fax: [phone]

## 2019-02-12 ENCOUNTER — Other Ambulatory Visit: Payer: Self-pay

## 2019-02-12 ENCOUNTER — Ambulatory Visit (INDEPENDENT_AMBULATORY_CARE_PROVIDER_SITE_OTHER): Payer: Medicare Other | Admitting: Student

## 2019-02-12 ENCOUNTER — Encounter: Payer: Self-pay | Admitting: Student

## 2019-02-12 VITALS — BP 138/84 | HR 62 | Temp 97.7°F | Ht 64.0 in | Wt 160.0 lb

## 2019-02-12 DIAGNOSIS — I2510 Atherosclerotic heart disease of native coronary artery without angina pectoris: Secondary | ICD-10-CM

## 2019-02-12 DIAGNOSIS — I10 Essential (primary) hypertension: Secondary | ICD-10-CM

## 2019-02-12 DIAGNOSIS — E785 Hyperlipidemia, unspecified: Secondary | ICD-10-CM

## 2019-02-12 DIAGNOSIS — I5032 Chronic diastolic (congestive) heart failure: Secondary | ICD-10-CM | POA: Diagnosis not present

## 2019-02-12 DIAGNOSIS — I272 Pulmonary hypertension, unspecified: Secondary | ICD-10-CM | POA: Diagnosis not present

## 2019-02-12 DIAGNOSIS — N183 Chronic kidney disease, stage 3 unspecified: Secondary | ICD-10-CM

## 2019-02-12 NOTE — Patient Instructions (Addendum)
Medication Instructions:  Your physician recommends that you continue on your current medications as directed. Please refer to the Current Medication list given to you today.  *If you need a refill on your cardiac medications before your next appointment, please call your pharmacy*  Lab Work: NONE  If you have labs (blood work) drawn today and your tests are completely normal, you will receive your results only by: [place] MyChart Message (if you have MyChart) OR . A paper copy in the mail If you have any lab test that is abnormal or we need to change your treatment, we will call you to review the results.  Testing/Procedures: NONE   Follow-Up: At [facility], you and your health needs are our priority.  As part of our continuing mission to provide you with exceptional heart care, we have created designated Provider Care Teams.  These Care Teams include your primary Cardiologist (physician) and Advanced Practice Providers (APPs -  Physician Assistants and Nurse Practitioners) who all work together to provide you with the care you need, when you need it.  Your next appointment:   3 months  The format for your next appointment:   In Person  Provider:   Rozann Lesches, MD  Other Instructions Thank you for choosing [facility]!    Limit daily fluid intake to less than 2 Liters per day. Please limit salt intake.  Please weight yourself every morning. Take an extra Lasix tablet if weight increases by 3 pounds overnight or 5 pounds in a single week.

## 2019-02-19 ENCOUNTER — Other Ambulatory Visit: Payer: Self-pay | Admitting: *Deleted

## 2019-02-19 NOTE — Patient Outreach (Signed)
[facility]) Care Management  02/19/2019  Anthony Galvan 1921-12-01 [id number]    EMMI-GENERAL DISCHARGE RED ON EMMI ALERT Day #4 Date:02/04/2019 Red Alert Reason: Lost of interest in things, questions/problems  Outreach #3 RN attempted outreach call to pt however remains unsuccessful and unable to left a message. Outreach letter send.  PLAN: Will allow 10 days prior to closure of this RED EMMI flag.  Raina Mina, RN Care Management Coordinator [place] Office [phone]

## 2019-02-22 DIAGNOSIS — S72141K Displaced intertrochanteric fracture of right femur, subsequent encounter for closed fracture with nonunion: Secondary | ICD-10-CM | POA: Diagnosis not present

## 2019-02-22 DIAGNOSIS — R0602 Shortness of breath: Secondary | ICD-10-CM | POA: Diagnosis not present

## 2019-03-05 ENCOUNTER — Other Ambulatory Visit: Payer: Self-pay | Admitting: *Deleted

## 2019-03-05 NOTE — Patient Outreach (Signed)
[facility]) Care Management  03/05/2019  ANANIA BARBIE Dec 13, 1921 [id number]    Unable to contact this pt for inquires on EMMI RED FLAG received several weeks ago. No response from pt on voice messages or outreach letter. Therefore this case will be closed.  Raina Mina, RN Care Management Coordinator [place] Office [phone]

## 2019-04-14 ENCOUNTER — Other Ambulatory Visit: Payer: Self-pay

## 2019-04-14 ENCOUNTER — Emergency Department (HOSPITAL_COMMUNITY)
Admission: EM | Admit: 2019-04-14 | Discharge: 2019-04-14 | Disposition: A | Discharge status: Home / Self Care | Payer: Medicare Other | Attending: Emergency Medicine | Admitting: Emergency Medicine

## 2019-04-14 ENCOUNTER — Emergency Department (HOSPITAL_COMMUNITY): Payer: Medicare Other

## 2019-04-14 ENCOUNTER — Encounter (HOSPITAL_COMMUNITY): Payer: Self-pay | Admitting: Emergency Medicine

## 2019-04-14 DIAGNOSIS — I259 Chronic ischemic heart disease, unspecified: Secondary | ICD-10-CM | POA: Insufficient documentation

## 2019-04-14 DIAGNOSIS — R55 Syncope and collapse: Secondary | ICD-10-CM | POA: Diagnosis not present

## 2019-04-14 DIAGNOSIS — I252 Old myocardial infarction: Secondary | ICD-10-CM | POA: Insufficient documentation

## 2019-04-14 DIAGNOSIS — I82409 Acute embolism and thrombosis of unspecified deep veins of unspecified lower extremity: Secondary | ICD-10-CM | POA: Diagnosis not present

## 2019-04-14 DIAGNOSIS — R519 Headache, unspecified: Secondary | ICD-10-CM | POA: Diagnosis not present

## 2019-04-14 DIAGNOSIS — I5033 Acute on chronic diastolic (congestive) heart failure: Secondary | ICD-10-CM | POA: Diagnosis not present

## 2019-04-14 DIAGNOSIS — N183 Chronic kidney disease, stage 3 unspecified: Secondary | ICD-10-CM | POA: Diagnosis not present

## 2019-04-14 DIAGNOSIS — I130 Hypertensive heart and chronic kidney disease with heart failure and stage 1 through stage 4 chronic kidney disease, or unspecified chronic kidney disease: Secondary | ICD-10-CM | POA: Insufficient documentation

## 2019-04-14 DIAGNOSIS — Z79899 Other long term (current) drug therapy: Secondary | ICD-10-CM | POA: Insufficient documentation

## 2019-04-14 DIAGNOSIS — Z955 Presence of coronary angioplasty implant and graft: Secondary | ICD-10-CM | POA: Insufficient documentation

## 2019-04-14 DIAGNOSIS — S0990XA Unspecified injury of head, initial encounter: Secondary | ICD-10-CM | POA: Diagnosis not present

## 2019-04-14 LAB — BASIC METABOLIC PANEL
Anion gap: 9 (ref 5–15)
BUN: 28 mg/dL — ABNORMAL HIGH (ref 8–23)
CO2: 28 mmol/L (ref 22–32)
Calcium: 9.3 mg/dL (ref 8.9–10.3)
Chloride: 104 mmol/L (ref 98–111)
Creatinine, Ser: 1.6 mg/dL — ABNORMAL HIGH (ref 0.61–1.24)
GFR calc Af Amer: 42 mL/min — ABNORMAL LOW (ref >60)
GFR calc non Af Amer: 36 mL/min — ABNORMAL LOW (ref >60)
Glucose, Bld: 97 mg/dL (ref 70–99)
Potassium: 4.2 mmol/L (ref 3.5–5.1)
Sodium: 141 mmol/L (ref 135–145)

## 2019-04-14 LAB — CBC WITH DIFFERENTIAL/PLATELET
Abs Immature Granulocytes: 0.04 10*3/uL (ref 0.00–0.07)
Basophils Absolute: 0.1 10*3/uL (ref 0.0–0.1)
Basophils Relative: 1 %
Eosinophils Absolute: 0 10*3/uL (ref 0.0–0.5)
Eosinophils Relative: 0 %
HCT: 40.4 % (ref 39.0–52.0)
Hemoglobin: 11.9 g/dL — ABNORMAL LOW (ref 13.0–17.0)
Immature Granulocytes: 0 %
Lymphocytes Relative: 14 %
Lymphs Abs: 1.6 10*3/uL (ref 0.7–4.0)
MCH: 28.7 pg (ref 26.0–34.0)
MCHC: 29.5 g/dL — ABNORMAL LOW (ref 30.0–36.0)
MCV: 97.6 fL (ref 80.0–100.0)
Monocytes Absolute: 0.7 10*3/uL (ref 0.1–1.0)
Monocytes Relative: 6 %
Neutro Abs: 8.6 10*3/uL — ABNORMAL HIGH (ref 1.7–7.7)
Neutrophils Relative %: 79 %
Platelets: 139 10*3/uL — ABNORMAL LOW (ref 150–400)
RBC: 4.14 MIL/uL — ABNORMAL LOW (ref 4.22–5.81)
RDW: 13.4 % (ref 11.5–15.5)
WBC: 11 10*3/uL — ABNORMAL HIGH (ref 4.0–10.5)
nRBC: 0 % (ref 0.0–0.2)

## 2019-04-14 NOTE — Discharge Instructions (Addendum)
Your lab tests, ekg, and Ct imaging are negative for any signs of injury from your fall today and for obvious reasons for having passed out (syncope). Please contact your primary MD for  a recheck of your symptoms this week.

## 2019-04-14 NOTE — ED Provider Notes (Signed)
[facility] EMERGENCY DEPARTMENT Provider Note   CSN: [id number] Arrival date & time: 04/14/19  1429     History Chief Complaint  Patient presents with  . Fall    Anthony Galvan is a 85 y.o. male with a history of CAD, CHF, GERD, hypertension, MI, history of pulmonary embolism presenting with a syncopal event.  He was in his wheelchair watching TV around noon, then woke around 1 AM on the floor.  He had no symptoms prior to this suspected syncopal event including palpitations, lightheadedness, shortness of breath or chest pain, and denies symptoms since the event as well.  He did wake with a nosebleed which has resolved.  He denies headache, facial pain including no nose pain or persistent bleeding which resolved spontaneously.  He does have a history of pulmonary embolism, but denies shortness of breath, chest pain as mentioned.  He had no prodromal symptoms prior to today's event.  He is wheelchair-bound at baseline, has had bilateral hip fractures secondary to falls, therefore no longer ambulates to avoid further injury.  Has had no treatment for his fall today and denies any symptoms at this time.  He does report swelling in his bilateral lower extremities which is chronic and not worsened today.  The history is provided by the patient.       Past Medical History:  Diagnosis Date  . Bronchiectasis ([place]) 06/13/2018  . CAD S/P percutaneous coronary angioplasty 08/01/2012   PCI of mid LAD with a VeriFlex Bare Metal Stent 3.0 mm x 12 mm - post-dilated to 3.5 mm.;  Catheterization in July 2018 showed with severe heavily calcified distal circumflex-left PDA stenosis.  Not favorable for PCI.  Marland Kitchen Congestive heart failure (CHF) ([place])   . Duodenal ulcer May 2011   on EGD  . GERD (gastroesophageal reflux disease)   . Helicobacter pylori gastritis MAY 2012 HP STOOL AG NEG  . HTN (hypertension)   . Hyperlipidemia   . ILD (interstitial lung disease) ([place]) 06/13/2018  . MI (myocardial  infarction) ([place]) 1976, 1980, [place] for at [facility] and [facility] [place] (Dr. Iona Beard)  . Osteoarthritis   . Pulmonary embolism ([place]) 06/13/2018  . Pulmonary hypertension ([place]) 10/21/2016  . S/P colonoscopy 2011   normal, internal hemorrhoids    Patient Active Problem List   Diagnosis Date Noted  . Acute on chronic respiratory failure ([place])   . Cor pulmonale (chronic) ([place]) 01/26/2019  . Chronic respiratory failure with hypoxia ([place]) 01/25/2019  . Bronchiectasis ([place]) 01/25/2019  . Acute CHF (congestive heart failure) ([place]) 01/24/2019  . Anemia 01/24/2019  . Thrombocytopenia ([place]) 01/24/2019  . Interstitial lung disease ([place]) 06/15/2018  . Pulmonary hypertension ([place]) 06/15/2018  . Acute pulmonary embolus ([place]) 06/14/2018  . Acute on chronic diastolic CHF (congestive heart failure) ([place]) 06/12/2018  . Acute respiratory failure with hypoxia ([place]) 06/12/2018  . Fracture 05/07/2018  . Closed displaced intertrochanteric fracture of left femur ([place])   . Coronary artery disease involving native heart without angina pectoris   . Dyspepsia 01/18/2017  . Closed disp intertrochanteric fracture of right femur with nonunion 11/30/2016  . PUD (peptic ulcer disease)   . History of coronary artery stent placement 10/08/2016  . Acute on chronic renal failure ([place]) 10/08/2016  . CKD (chronic kidney disease) stage 3, GFR 30-59 ml/min (HCC) 10/08/2016  . Bloating 09/03/2013  . CAD S/P percutaneous coronary angioplasty   . MI (myocardial infarction) ([place])   . Presence of bare metal stent in  LAD coronary artery - VerifFlex BMS 3.0 mm x 12 mm - post-dilated to 3.5 mm 08/01/2012  . Unstable angina ([place]) 07/30/2012  . Gastritis 12/15/2010  . Nausea 08/30/2010  . Hyperlipidemia with target low density lipoprotein (LDL) cholesterol less than 70 mg/dL 05/21/2006  . CATARACT NOS 05/21/2006  . Essential hypertension 05/21/2006  . MYOCARDIAL INFARCTION, HX OF 05/21/2006  . GERD 05/21/2006  .  OVERACTIVE BLADDER 05/21/2006  . OSTEOARTHRITIS 05/21/2006  . URINARY INCONTINENCE 05/21/2006    Past Surgical History:  Procedure Laterality Date  . CARDIAC CATHETERIZATION  01/30/2007   D1 75 %, mid LAD 50%, 50-70% circumflex, 50-70% RCA.(Dr. Adora Fridge)  . CARDIAC CATHETERIZATION  12/11/2001   normal L main, small RCA, dominant LL Cfx with mild diffuse disease, LAD with mid 10-20% stenosis, ramus intermedius with mild diffuse disease (Dr. Jackie Plum)  . CATARACT EXTRACTION    . COLONOSCOPY  OCT 2011 ARS   [facility] [place]   in setting of MI  . ESOPHAGOGASTRODUODENOSCOPY N/A 10/09/2016   Procedure: ESOPHAGOGASTRODUODENOSCOPY (EGD);  Surgeon: Danie Binder, MD;  Location[facility];  Service: Endoscopy;  Laterality: N/A;  . FEMUR IM NAIL Right 12/01/2016   Procedure: INTRAMEDULLARY (IM) NAIL RIGHT HIP;  Surgeon: Altamese [place], MD;  Location: [place];  Service: Orthopedics;  Laterality: Right;  . FEMUR IM NAIL Left 05/07/2018   Procedure: INTRAMEDULLARY (IM) NAIL FEMORAL;  Surgeon: Nicholes Stairs, MD;  Location: [place];  Service: Orthopedics;  Laterality: Left;  . LEFT HEART CATH AND CORONARY ANGIOGRAPHY N/A 10/23/2016   Procedure: Left Heart Cath and Coronary Angiography;  Surgeon: Sherren Mocha, MD;  Location: [place] CV LAB;  Service: Cardiovascular: Two-vessel CAD (left dominant).  Patent BMS in p LAD.  Severe, heavily calcified dCx-LPDA lesion.  Not favorable for PCI.  Marland Kitchen LEFT HEART CATHETERIZATION WITH CORONARY ANGIOGRAM N/A 08/01/2012   Procedure: LEFT HEART CATHETERIZATION WITH CORONARY ANGIOGRAM;  Surgeon: Leonie Man, MD;  Location: [facility] CATH LAB: Mid LAD 99% apple core; D2 ostial 70-80% (2 small for PCI) small nondominant RCA 60-70%. Distal circumflex/L PDA ~50% --> PCI of LAD  . NM MYOCAR PERF WALL MOTION  08/2003   adenosine stress - focal decreased perfusion defect in distal inferior wall, no significant ischemic changes  . PERCUTANEOUS STENT  INTERVENTION  08/01/2012   Procedure: PERCUTANEOUS STENT INTERVENTION;  Surgeon: Leonie Man, MD;  Location: [facility] LAB; ;PCI of mid LAD with a VeriFlex Bare Metal Stent 3.0 mm x 12 mm - post-dilated to 3.5 mm.  . TRANSTHORACIC ECHOCARDIOGRAM  10/2016   mild LVH - 60-65%. Gr 1 DD. No RWMA. Mild MR. Mod TR. Mild RA dilation.  PAP ~ 60 mmHg.  Marland Kitchen UPPER GASTROINTESTINAL ENDOSCOPY  08/2009   MELENA, HEMATEMESIS --> DUODENAL ULCER, Bx: H PYLORI POS       Family History  Problem Relation Age of Onset  . Heart disease Mother   . Kidney disease Father   . Heart disease Brother 34  . Heart disease Sister   . Colon cancer Neg Hx     Social History   Tobacco Use  . Smoking status: Never Smoker  . Smokeless tobacco: Never Used  Substance Use Topics  . Alcohol use: No  . Drug use: No    Home Medications Prior to Admission medications   Medication Sig Start Date End Date Taking? Authorizing Provider  acetaminophen (TYLENOL) 325 MG tablet Take 2 tablets (650 mg  total) by mouth 3 (three) times daily as needed (pain). 12/04/16   Ainsley Spinner, PA-C  alum & mag hydroxide-simeth (MAALOX/MYLANTA) 200-200-20 MG/5ML suspension Take 30 mLs by mouth every 4 (four) hours as needed for indigestion. 01/29/19   Johnson, Clanford L, MD  aspirin 325 MG tablet Take 1 tablet (325 mg total) by mouth daily. 05/13/18   Mullis, Kiersten P, DO  atorvastatin (LIPITOR) 80 MG tablet Take 1 tablet (80 mg total) by mouth daily at 6 PM. 08/02/12   Brett Canales, PA-C  calcium carbonate (OS-CAL - DOSED IN MG OF ELEMENTAL CALCIUM) 1250 (500 Ca) MG tablet Take 1 tablet (500 mg of elemental calcium total) by mouth daily with breakfast. 05/13/18   Mullis, Kiersten P, DO  furosemide (LASIX) 40 MG tablet Take 1 tablet (40 mg total) by mouth daily. 01/29/19   Johnson, Clanford L, MD  guaiFENesin (MUCINEX) 600 MG 12 hr tablet Take 1 tablet (600 mg total) by mouth 2 (two) times daily. 06/15/18   Kathie Dike, MD   ipratropium-albuterol (DUONEB) 0.5-2.5 (3) MG/3ML SOLN Take 3 mLs by nebulization 3 (three) times daily. 01/29/19   Johnson, Clanford L, MD  isosorbide dinitrate (ISORDIL) 20 MG tablet Take 1 tablet (20 mg total) by mouth 3 (three) times daily. 01/29/19 02/28/19  Johnson, Clanford L, MD  metoprolol tartrate (LOPRESSOR) 25 MG tablet Take 0.5 tablets (12.5 mg total) by mouth 2 (two) times daily. 05/13/18   Mullis, Kiersten P, DO  Multiple Vitamin (MULTIVITAMIN WITH MINERALS) TABS tablet Take 1 tablet by mouth daily.    [provider]  NITROSTAT 0.4 MG SL tablet PLACE 1 TABLET UNDER TONGUE EVERY 5 MINUTES FOR 3 DOSES AS NEEDED. 11/17/13   Leonie Man, MD  pantoprazole (PROTONIX) 40 MG tablet Take 1 tablet (40 mg total) by mouth daily. 01/30/19   Johnson, Clanford L, MD  polyethylene glycol (MIRALAX / GLYCOLAX) packet Take 17 g by mouth 2 (two) times daily. 05/13/18   Mullis, Kiersten P, DO  Vitamin D, Ergocalciferol, (DRISDOL) 1.25 MG (50000 UT) CAPS capsule Take 1 capsule (50,000 Units total) by mouth every 7 (seven) days. 05/16/18   Mullis, Kiersten P, DO    Allergies    Lisinopril  Review of Systems   Review of Systems  Constitutional: Negative for chills and fever.  HENT: Positive for nosebleeds. Negative for congestion and sore throat.   Eyes: Negative.   Respiratory: Negative for chest tightness and shortness of breath.   Cardiovascular: Negative for chest pain and palpitations.  Gastrointestinal: Negative for abdominal pain, nausea and vomiting.  Genitourinary: Negative.   Musculoskeletal: Negative for arthralgias, joint swelling and neck pain.  Skin: Negative.  Negative for rash and wound.  Neurological: Negative for dizziness, weakness, light-headedness, numbness and headaches.  Psychiatric/Behavioral: Negative.     Physical Exam Updated Vital Signs BP (!) 145/77   Pulse 72   Temp 98.8 F (37.1 C) (Oral)   Resp (!) 22   SpO2 97%   Physical Exam Vitals and  nursing note reviewed.  Constitutional:      Appearance: He is well-developed.  HENT:     Head: Normocephalic and atraumatic.     Nose:     Comments: Nidus of recent nosebleed noted right anterior nasal septum.  No active bleeding.  No septal hematoma. Eyes:     Conjunctiva/sclera: Conjunctivae normal.  Cardiovascular:     Rate and Rhythm: Normal rate and regular rhythm.     Heart sounds: Normal heart sounds.  Pulmonary:     Effort: Pulmonary effort is normal.     Breath sounds: Normal breath sounds. No wheezing.  Abdominal:     General: Bowel sounds are normal.     Palpations: Abdomen is soft.     Tenderness: There is no abdominal tenderness.  Musculoskeletal:        General: Normal range of motion.     Cervical back: Normal range of motion.     Right lower leg: Edema present.     Left lower leg: Edema present.     Comments: Calves soft, negative Homans.   Skin:    General: Skin is warm and dry.  Neurological:     Mental Status: He is alert.     ED Results / Procedures / Treatments   Labs (all labs ordered are listed, but only abnormal results are displayed) Labs Reviewed  CBC WITH DIFFERENTIAL/PLATELET - Abnormal; Notable for the following components:      Result Value   WBC 11.0 (*)    RBC 4.14 (*)    Hemoglobin 11.9 (*)    MCHC 29.5 (*)    Platelets 139 (*)    Neutro Abs 8.6 (*)    All other components within normal limits  BASIC METABOLIC PANEL - Abnormal; Notable for the following components:   BUN 28 (*)    Creatinine, Ser 1.60 (*)    GFR calc non Af Amer 36 (*)    GFR calc Af Amer 42 (*)    All other components within normal limits    EKG EKG Interpretation  Date/Time:  Monday April 14 2019 18:18:07 EST Ventricular Rate:  66 PR Interval:    QRS Duration: 98 QT Interval:  444 QTC Calculation: 466 R Axis:   -60 Text Interpretation: Sinus rhythm Left atrial enlargement Left anterior fascicular block RSR' in V1 or V2, right VCD or RVH since last  tracing no significant change Confirmed by Noemi Chapel [id number]) on 04/14/2019 9:19:04 PM   Radiology No results found.  Head Ct negative for acute injury or other acute abnormalities.  Reading did not cross over, but imaging and findings reviewed by me.     Procedures Procedures (including critical care time)  Medications Ordered in ED Medications - No data to display  ED Course  I have reviewed the triage vital signs and the nursing notes.  Pertinent labs & imaging results that were available during my care of the patient were reviewed by me and considered in my medical decision making (see chart for details).    MDM Rules/Calculators/A&P                      Pt with suspected syncopal event with no no residual sx.  He was on the heart monitor while here and there were no episodes of ectopy or arrhythmia.  He has no sob, no cp, no pleurisy or other findings to suggest PE as the source of todays event. He was encouraged close f/u with his pcp for a recheck this week.  VSS.  Pt in no distress at time of dc.     Final Clinical Impression(s) / ED Diagnoses Final diagnoses:  Syncope, unspecified syncope type    Rx / DC Orders ED Discharge Orders    None       Landis Martins 04/15/19 0052    Noemi Chapel, MD 04/15/19 [phone]

## 2019-04-14 NOTE — ED Triage Notes (Signed)
Pt not really sure what happened.  Pt says he and his wheel chair turned over.  When he came to, his nose was bleeding .  Denies injury to head.  Fall happened about 1215 today.

## 2019-04-29 ENCOUNTER — Encounter: Payer: Self-pay | Admitting: Cardiology

## 2019-04-29 ENCOUNTER — Ambulatory Visit (INDEPENDENT_AMBULATORY_CARE_PROVIDER_SITE_OTHER): Payer: Medicare Other | Admitting: Cardiology

## 2019-04-29 VITALS — BP 126/69 | HR 94 | Temp 97.5°F | Ht 64.0 in | Wt 160.0 lb

## 2019-04-29 DIAGNOSIS — N1832 Chronic kidney disease, stage 3b: Secondary | ICD-10-CM | POA: Diagnosis not present

## 2019-04-29 DIAGNOSIS — I272 Pulmonary hypertension, unspecified: Secondary | ICD-10-CM | POA: Diagnosis not present

## 2019-04-29 DIAGNOSIS — I5032 Chronic diastolic (congestive) heart failure: Secondary | ICD-10-CM | POA: Diagnosis not present

## 2019-04-29 DIAGNOSIS — I25119 Atherosclerotic heart disease of native coronary artery with unspecified angina pectoris: Secondary | ICD-10-CM

## 2019-04-29 NOTE — Progress Notes (Signed)
Cardiology Office Note  Date: 04/29/2019   ID: Anthony Galvan, DOB March 05, 1922, MRN [id number]  PCP:  [name], MD  Cardiologist:  Glenetta Hew, MD Electrophysiologist:  None   Chief Complaint  Patient presents with  . Cardiac follow-up    History of Present Illness: Anthony Galvan is a 86 y.o. male last seen by Ms. Strader PA-C in October 2020.  He is a former patient of Dr. Ellyn Hack that I am seeing for the first time in clinic today.  I reviewed his records.  He was hospitalized in October of last year with fluid overload, 9 pound weight gain, diuretics were adjusted. Echocardiogram at that time revealed LVEF 65 to 70% with moderate to severe tricuspid regurgitation and RVSP 53 mmHg.  He presents today in a wheelchair for follow-up.  Typically wears supplemental oxygen as well although did not have it on in the office.  He follows with the [place] [facility] in [place].  Since last encounter his weight has been stable, he has chronic mild leg swelling, no orthopnea or PND.  He does not report any progressive angina symptoms, occasionally uses sublingual nitroglycerin.  I reviewed his medications which are outlined below.  Past Medical History:  Diagnosis Date  . Bronchiectasis ([place]) 06/13/2018  . CAD (coronary artery disease) 08/01/2012   BMS mid LAD; catheterization in July 2018 showed with severe heavily calcified distal circumflex-left PDA stenosis, not favorable for PCI.  Marland Kitchen Congestive heart failure (CHF) ([place])   . Duodenal ulcer May 2011   on EGD  . Essential hypertension   . GERD (gastroesophageal reflux disease)   . Helicobacter pylori gastritis 2012  . Hyperlipidemia   . ILD (interstitial lung disease) ([place])   . MI (myocardial infarction) ([place]) 1976, 1980, [place] for at [facility] [place] (Dr. Iona Beard)  . Osteoarthritis   . Pulmonary embolism ([place])   . Pulmonary hypertension ([place])   . S/P colonoscopy 2011   normal, internal hemorrhoids     Past Surgical History:  Procedure Laterality Date  . CARDIAC CATHETERIZATION  01/30/2007   D1 75 %, mid LAD 50%, 50-70% circumflex, 50-70% RCA.(Dr. Adora Fridge)  . CARDIAC CATHETERIZATION  12/11/2001   normal L main, small RCA, dominant LL Cfx with mild diffuse disease, LAD with mid 10-20% stenosis, ramus intermedius with mild diffuse disease (Dr. Jackie Plum)  . CATARACT EXTRACTION    . COLONOSCOPY  OCT 2011 ARS   SML [place]   in setting of MI  . ESOPHAGOGASTRODUODENOSCOPY N/A 10/09/2016   Procedure: ESOPHAGOGASTRODUODENOSCOPY (EGD);  Surgeon: Danie Binder, MD;  [facility];  Service: Endoscopy;  Laterality: N/A;  . FEMUR IM NAIL Right 12/01/2016   Procedure: INTRAMEDULLARY (IM) NAIL RIGHT HIP;  Surgeon: Altamese Plymouth, MD;  Location: [place];  Service: Orthopedics;  Laterality: Right;  . FEMUR IM NAIL Left 05/07/2018   Procedure: INTRAMEDULLARY (IM) NAIL FEMORAL;  Surgeon: Nicholes Stairs, MD;  Location: [place];  Service: Orthopedics;  Laterality: Left;  . LEFT HEART CATH AND CORONARY ANGIOGRAPHY N/A 10/23/2016   Procedure: Left Heart Cath and Coronary Angiography;  Surgeon: Sherren Mocha, MD;  Location: [place] CV LAB;  Service: Cardiovascular: Two-vessel CAD (left dominant).  Patent BMS in p LAD.  Severe, heavily calcified dCx-LPDA lesion.  Not favorable for PCI.  Marland Kitchen LEFT HEART CATHETERIZATION WITH CORONARY ANGIOGRAM N/A 08/01/2012   Procedure: LEFT HEART CATHETERIZATION WITH CORONARY ANGIOGRAM;  Surgeon: Leonie Man, MD;  Location: [facility] CATH LAB: Mid LAD 99% apple core; D2 ostial 70-80% (2 small for PCI) small nondominant RCA 60-70%. Distal circumflex/L PDA ~50% --> PCI of LAD  . NM MYOCAR PERF WALL MOTION  08/2003   adenosine stress - focal decreased perfusion defect in distal inferior wall, no significant ischemic changes  . PERCUTANEOUS STENT INTERVENTION  08/01/2012   Procedure: PERCUTANEOUS STENT INTERVENTION;  Surgeon: Leonie Man, MD;  Location: [facility] CATH LAB; ;PCI of mid LAD with a VeriFlex Bare Metal Stent 3.0 mm x 12 mm - post-dilated to 3.5 mm.  . TRANSTHORACIC ECHOCARDIOGRAM  10/2016   mild LVH - 60-65%. Gr 1 DD. No RWMA. Mild MR. Mod TR. Mild RA dilation.  PAP ~ 60 mmHg.  Marland Kitchen UPPER GASTROINTESTINAL ENDOSCOPY  08/2009   MELENA, HEMATEMESIS --> DUODENAL ULCER, Bx: H PYLORI POS    Current Outpatient Medications  Medication Sig Dispense Refill  . acetaminophen (TYLENOL) 325 MG tablet Take 2 tablets (650 mg total) by mouth 3 (three) times daily as needed (pain).    Marland Kitchen alum & mag hydroxide-simeth (MAALOX/MYLANTA) 200-200-20 MG/5ML suspension Take 30 mLs by mouth every 4 (four) hours as needed for indigestion. 355 mL 0  . aspirin 325 MG tablet Take 1 tablet (325 mg total) by mouth daily. 30 tablet 0  . atorvastatin (LIPITOR) 80 MG tablet Take 1 tablet (80 mg total) by mouth daily at 6 PM. 30 tablet 5  . calcium carbonate (OS-CAL - DOSED IN MG OF ELEMENTAL CALCIUM) 1250 (500 Ca) MG tablet Take 1 tablet (500 mg of elemental calcium total) by mouth daily with breakfast.    . furosemide (LASIX) 40 MG tablet Take 1 tablet (40 mg total) by mouth daily. 30 tablet 1  . guaiFENesin (MUCINEX) 600 MG 12 hr tablet Take 1 tablet (600 mg total) by mouth 2 (two) times daily. 30 tablet 0  . ipratropium-albuterol (DUONEB) 0.5-2.5 (3) MG/3ML SOLN Take 3 mLs by nebulization 3 (three) times daily. 360 mL 2  . metoprolol tartrate (LOPRESSOR) 25 MG tablet Take 0.5 tablets (12.5 mg total) by mouth 2 (two) times daily. 30 tablet 0  . Multiple Vitamin (MULTIVITAMIN WITH MINERALS) TABS tablet Take 1 tablet by mouth daily.    Marland Kitchen NITROSTAT 0.4 MG SL tablet PLACE 1 TABLET UNDER TONGUE EVERY 5 MINUTES FOR 3 DOSES AS NEEDED. 25 tablet 6  . pantoprazole (PROTONIX) 40 MG tablet Take 1 tablet (40 mg total) by mouth daily. 30 tablet 1  . polyethylene glycol (MIRALAX / GLYCOLAX) packet Take 17 g by mouth 2 (two) times daily. 14 each 0  . Vitamin D,  Ergocalciferol, (DRISDOL) 1.25 MG (50000 UT) CAPS capsule Take 1 capsule (50,000 Units total) by mouth every 7 (seven) days. 5 capsule   . isosorbide dinitrate (ISORDIL) 20 MG tablet Take 1 tablet (20 mg total) by mouth 3 (three) times daily. 90 tablet 1   No current facility-administered medications for this visit.   Allergies:  Lisinopril   Social History: The patient  reports that he has never smoked. He has never used smokeless tobacco. He reports that he does not drink alcohol or use drugs.   ROS:  Please see the history of present illness. Otherwise, complete review of systems is positive for chronic dyspnea on exertion.  All other systems are reviewed and negative.   Physical Exam: VS:  BP 126/69   Pulse 94   Temp (!) 97.5 F (36.4 C)   Ht  5\' 4"  (1.626 m)   Wt 160 lb (72.6 kg)   SpO2 96%   BMI 27.46 kg/m , BMI Body mass index is 27.46 kg/m.  Wt Readings from Last 3 Encounters:  04/29/19 160 lb (72.6 kg)  02/12/19 160 lb (72.6 kg)  01/29/19 164 lb 10.9 oz (74.7 kg)    General: Elderly male seated in wheelchair, no distress. HEENT: Conjunctiva and lids normal, wearing a mask. Neck: Supple, no elevated JVP or carotid bruits, no thyromegaly. Lungs: Coarse breath sounds without crackles, nonlabored breathing at rest. Cardiac: Regular rate and rhythm, no S3, 2/6 systolic murmur. Abdomen: Soft, nontender, bowel sounds present. Extremities: No pitting edema, distal pulses 2+. Skin: Warm and dry. Musculoskeletal: No kyphosis. Neuropsychiatric: Alert and oriented x3, affect grossly appropriate.  ECG:  An ECG dated 04/14/2019 was personally reviewed today and demonstrated:  Sinus rhythm with left atrial enlargement and left anterior fascicular block.  Recent Labwork: 01/25/2019: ALT 16; AST 23 01/26/2019: Magnesium 2.1 01/28/2019: B Natriuretic Peptide 635.0 04/14/2019: BUN 28; Creatinine, Ser 1.60; Hemoglobin 11.9; Platelets 139; Potassium 4.2; Sodium 141     Component  Value Date/Time   CHOL 119 10/21/2016 0423   TRIG 87 10/21/2016 0423   HDL 57 10/21/2016 0423   CHOLHDL 2.1 10/21/2016 0423   VLDL 17 10/21/2016 0423   LDLCALC 45 10/21/2016 0423    Other Studies Reviewed Today:  Echocardiogram 01/25/2019:  1. Left ventricular ejection fraction, by visual estimation, is 65 to 70%. The left ventricle has normal function. Normal left ventricular size. There is no left ventricular hypertrophy.  2. Elevated left ventricular end-diastolic pressure.  3. Left ventricular diastolic Doppler parameters are consistent with impaired relaxation pattern of LV diastolic filling.  4. Global right ventricle has normal systolic function.The right ventricular size is moderately enlarged. No increase in right ventricular wall thickness.  5. Left atrial size was normal.  6. Right atrial size was normal.  7. The mitral valve is normal in structure. No evidence of mitral valve regurgitation. No evidence of mitral stenosis.  8. The tricuspid valve is normal in structure. Tricuspid valve regurgitation moderate-severe.  9. The aortic valve is normal in structure. Aortic valve regurgitation is mild by color flow Doppler. Mild to moderate aortic valve sclerosis/calcification without any evidence of aortic stenosis. 10. The pulmonic valve was normal in structure. Pulmonic valve regurgitation is not visualized by color flow Doppler. 11. Moderately elevated pulmonary artery systolic pressure. 12. The tricuspid regurgitant velocity is 3.29 m/s, and with an assumed right atrial pressure of 10 mmHg, the estimated right ventricular systolic pressure is moderately elevated at 53.3 mmHg. 13. The inferior vena cava is normal in size with greater than 50% respiratory variability, suggesting right atrial pressure of 3 mmHg.  Assessment and Plan:  1.  CAD with history of BMS to the LAD in 2014 and 80% distal circumflex disease that is not suitable for intervention and being managed medically.   He has stable angina symptoms.  Plan to continue current therapy including aspirin, Lipitor, Isordil, and Lopressor.  2.  Diastolic heart failure with RV dysfunction and pulmonary hypertension.  Continue current dose of Lasix, weight has been stable.  3.  Interstitial lung disease with chronic hypoxic respiratory failure.  He is on supplemental oxygen.  4.  Mixed hyperlipidemia, continues on Lipitor.  Medication Adjustments/Labs and Tests Ordered: Current medicines are reviewed at length with the patient today.  Concerns regarding medicines are outlined above.   Tests Ordered: No orders of the defined  types were placed in this encounter.   Medication Changes: No orders of the defined types were placed in this encounter.   Disposition:  Follow up 6 months in the [place] office.  Signed, Satira Sark, MD, [facility] 04/29/2019 5:03 PM    [facility] at [facility] [address] [place], [place] [address] Phone: [phone]; Fax: [phone]

## 2019-04-29 NOTE — Patient Instructions (Signed)
Medication Instructions:  Your physician recommends that you continue on your current medications as directed. Please refer to the Current Medication list given to you today.  *If you need a refill on your cardiac medications before your next appointment, please call your pharmacy*  Lab Work: NONE If you have labs (blood work) drawn today and your tests are completely normal, you will receive your results only by: Marland Kitchen MyChart Message (if you have MyChart) OR . A paper copy in the mail If you have any lab test that is abnormal or we need to change your treatment, we will call you to review the results.  Testing/Procedures: NONE  Follow-Up: At [facility], you and your health needs are our priority.  As part of our continuing mission to provide you with exceptional heart care, we have created designated Provider Care Teams.  These Care Teams include your primary Cardiologist (physician) and Advanced Practice Providers (APPs -  Physician Assistants and Nurse Practitioners) who all work together to provide you with the care you need, when you need it.  Your next appointment:   6 month(s)  The format for your next appointment:   In Person  Provider:   Rozann Lesches, MD  Other Instructions None      Thank you for choosing [facility] !

## 2019-05-05 ENCOUNTER — Ambulatory Visit: Payer: Medicare Other | Admitting: Cardiology

## 2019-05-29 ENCOUNTER — Ambulatory Visit (INDEPENDENT_AMBULATORY_CARE_PROVIDER_SITE_OTHER): Payer: Medicare Other | Admitting: Gastroenterology

## 2019-05-29 ENCOUNTER — Other Ambulatory Visit: Payer: Self-pay

## 2019-05-29 ENCOUNTER — Encounter: Payer: Self-pay | Admitting: Gastroenterology

## 2019-05-29 DIAGNOSIS — K5901 Slow transit constipation: Secondary | ICD-10-CM | POA: Diagnosis not present

## 2019-05-29 DIAGNOSIS — K219 Gastro-esophageal reflux disease without esophagitis: Secondary | ICD-10-CM | POA: Diagnosis not present

## 2019-05-29 DIAGNOSIS — K5900 Constipation, unspecified: Secondary | ICD-10-CM | POA: Insufficient documentation

## 2019-05-29 NOTE — Progress Notes (Signed)
Subjective:    Patient ID: Anthony Galvan, male    DOB: 30-Mar-1922, 86 y.o.   MRN: [id number]  Anthony Kettle, MD  HPI LAST SEEN 2018. REPORTS HAVING BLOOD ON HIS SHEETS. HE SAYS HE'S HAVING NOSEBLEEDS. WILL SNEEZE AND THEN IT ITCHES AND NOSE BLEEDS. GETS NOSE BLEEDS. HAVING SWELLING IN HIS FEET.  HAD BLOOD IN STOOL 1 YEAR AGO. BOWELS MOVING EVERY AM. GOES THROUGH A BOTTLE OF MAALOX ONCE A WEEK. STOMACH UPSET "NOT TOO BAD". HEARTBURN: 3-4X/MO???. URINE CAN BE YELLOW OR TEA. PAIN IN STOMACH: FEELS LIKE NAUSEA AND THEN HE'LL BURP AND GO HAVE A BM AND THINGS GET BETTER. RANDOM TIMES. HAD ONE THIS AM. SYMPTOMS: REST AND WAKES UP AND FEELS FINE. WEIGHT STABLE AT 161 LBS. MILK: DRINKS PRETTY GOOD, CHEESE: NO, CHOCOLATE: NOT REALLY, ICE CREAM: LOVES IT BUT BUYS IT LESS OFTEN. EAT BREAKFAST AND THEN LUNCH.  PT DENIES FEVER, CHILLS, HEMATOCHEZIA, HEMATEMESIS, nausea, vomiting, melena, diarrhea, CHEST PAIN, SHORTNESS OF BREATH, CHANGE IN BOWEL IN HABITS, constipation, abdominal pain, problems swallowing, OR heartburn or indigestion.  Past Medical History:  Diagnosis Date  . Bronchiectasis ([place]) 06/13/2018  . CAD (coronary artery disease) 08/01/2012   BMS mid LAD; catheterization in July 2018 showed with severe heavily calcified distal circumflex-left PDA stenosis, not favorable for PCI.  Marland Kitchen Congestive heart failure (CHF) ([place])   . Duodenal ulcer May 2011   on EGD  . Essential hypertension   . GERD (gastroesophageal reflux disease)   . Helicobacter pylori gastritis 2012  . Hyperlipidemia   . ILD (interstitial lung disease) ([place])   . MI (myocardial infarction) ([place]) 1976, 1980, [place] for at [facility] and [place] (Dr. Iona Beard)  . Osteoarthritis   . Pulmonary embolism ([place])   . Pulmonary hypertension ([place])   . S/P colonoscopy 2011   normal, internal hemorrhoids    Past Surgical History:  Procedure Laterality Date  . CARDIAC CATHETERIZATION  01/30/2007   D1 75 %, mid LAD  50%, 50-70% circumflex, 50-70% RCA.(Dr. Adora Fridge)  . CARDIAC CATHETERIZATION  12/11/2001   normal L main, small RCA, dominant LL Cfx with mild diffuse disease, LAD with mid 10-20% stenosis, ramus intermedius with mild diffuse disease (Dr. Jackie Plum)  . CATARACT EXTRACTION    . COLONOSCOPY  OCT 2011 ARS   SML [place]   in setting of MI  . ESOPHAGOGASTRODUODENOSCOPY N/A 10/09/2016   Procedure: ESOPHAGOGASTRODUODENOSCOPY (EGD);  Surgeon: Danie Binder, MD;  [facility];  Service: Endoscopy;  Laterality: N/A;  . FEMUR IM NAIL Right 12/01/2016   Procedure: INTRAMEDULLARY (IM) NAIL RIGHT HIP;  Surgeon: Altamese Mondamin, MD;  Location: [place];  Service: Orthopedics;  Laterality: Right;  . FEMUR IM NAIL Left 05/07/2018   Procedure: INTRAMEDULLARY (IM) NAIL FEMORAL;  Surgeon: Nicholes Stairs, MD;  Location: [place];  Service: Orthopedics;  Laterality: Left;  . LEFT HEART CATH AND CORONARY ANGIOGRAPHY N/A 10/23/2016   Procedure: Left Heart Cath and Coronary Angiography;  Surgeon: Sherren Mocha, MD;  Location: [place] CV LAB;  Service: Cardiovascular: Two-vessel CAD (left dominant).  Patent BMS in p LAD.  Severe, heavily calcified dCx-LPDA lesion.  Not favorable for PCI.  Marland Kitchen LEFT HEART CATHETERIZATION WITH CORONARY ANGIOGRAM N/A 08/01/2012   Procedure: LEFT HEART CATHETERIZATION WITH CORONARY ANGIOGRAM;  Surgeon: Leonie Man, MD;  Location: [facility] CATH LAB: Mid LAD 99% apple core; D2 ostial 70-80% (2 small for PCI) small  nondominant RCA 60-70%. Distal circumflex/L PDA ~50% --> PCI of LAD  . NM MYOCAR PERF WALL MOTION  08/2003   adenosine stress - focal decreased perfusion defect in distal inferior wall, no significant ischemic changes  . PERCUTANEOUS STENT INTERVENTION  08/01/2012   Procedure: PERCUTANEOUS STENT INTERVENTION;  Surgeon: Leonie Man, MD;  Location: [facility] CATH LAB; ;PCI of mid LAD with a VeriFlex Bare Metal Stent 3.0 mm x 12 mm - post-dilated to 3.5  mm.  . TRANSTHORACIC ECHOCARDIOGRAM  10/2016   mild LVH - 60-65%. Gr 1 DD. No RWMA. Mild MR. Mod TR. Mild RA dilation.  PAP ~ 60 mmHg.  Marland Kitchen UPPER GASTROINTESTINAL ENDOSCOPY  08/2009   MELENA, HEMATEMESIS --> DUODENAL ULCER, Bx: H PYLORI POS   Allergies  Allergen Reactions  . Lisinopril Other (See Comments)    "Allergic," per the [facility]- patient is unaware of any reaction (??)   Current Outpatient Medications  Medication Sig    . acetaminophen (TYLENOL) 325 MG tablet Take 2 tablets (650 mg total) by mouth 3 (three) times daily as needed (pain).    Marland Kitchen alum & mag hydroxide-simeth (MAALOX/MYLANTA) 200-200-20 MG/5ML suspension Take 30 mLs by mouth every 4 (four) hours as needed for indigestion.    Marland Kitchen aspirin 325 MG tablet Take 1 tablet (325 mg total) by mouth daily.    Marland Kitchen atorvastatin (LIPITOR) 80 MG tablet Take 1 tablet (80 mg total) by mouth daily at 6 PM.    . calcium carbonate (OS-CAL - DOSED IN MG OF ELEMENTAL CALCIUM) 1250 (500 Ca) MG tablet Take 1 tablet (500 mg of elemental calcium total) by mouth daily with breakfast.    . furosemide (LASIX) 40 MG tablet Take 1 tablet (40 mg total) by mouth daily.    Marland Kitchen guaiFENesin (MUCINEX) 600 MG 12 hr tablet Take 1 tablet (600 mg total) by mouth 2 (two) times daily.    Marland Kitchen ipratropium-albuterol (DUONEB) 0.5-2.5 (3) MG/3ML SOLN Take 3 mLs by nebulization 3 (three) times daily.    . isosorbide dinitrate (ISORDIL) 20 MG tablet Take 1 tablet (20 mg total) by mouth 3 (three) times daily. (Patient taking differently: Take 60 mg by mouth 3 (three) times daily. )    . metoprolol tartrate (LOPRESSOR) 25 MG tablet Take 0.5 tablets (12.5 mg total) by mouth 2 (two) times daily.    . Multiple Vitamin (MULTIVITAMIN WITH MINERALS) TABS tablet Take 1 tablet by mouth daily.    Marland Kitchen NITROSTAT 0.4 MG SL tablet PLACE 1 TABLET UNDER TONGUE EVERY 5 MINUTES FOR 3 DOSES AS NEEDED.    Marland Kitchen pantoprazole (PROTONIX) 40 MG tablet Take 1 tablet (40 mg total) by mouth daily.    . polyethylene  glycol (MIRALAX / GLYCOLAX) packet Take 17 g by mouth 2 (two) times daily. (Patient taking differently: Take 17 g by mouth as needed. )    . Vitamin D, Ergocalciferol, (DRISDOL) 1.25 MG (50000 UT) CAPS capsule Take 1 capsule (50,000 Units total) by mouth every 7 (seven) days.     Review of Systems PER HPI OTHERWISE ALL SYSTEMS ARE NEGATIVE.    Objective:   Physical Exam Constitutional:      General: He is not in acute distress.    Appearance: Normal appearance.  HENT:     Mouth/Throat:     Comments: MASK IN PLACE Eyes:     General: No scleral icterus.    Pupils: Pupils are equal, round, and reactive to light.  Cardiovascular:     Rate and Rhythm:  Normal rate and regular rhythm.     Pulses: Normal pulses.     Heart sounds: Normal heart sounds.  Pulmonary:     Effort: Pulmonary effort is normal.     Breath sounds: Normal breath sounds.  Abdominal:     General: Bowel sounds are normal.     Palpations: Abdomen is soft.     Tenderness: There is no abdominal tenderness.     Comments: LIMITED EXAM PT IN [facility]  Musculoskeletal:     Cervical back: Normal range of motion.     Right lower leg: Edema present.     Left lower leg: Edema present.     Comments: WALKS ASSISTED WITH WHEELCHAIR.  Lymphadenopathy:     Cervical: No cervical adenopathy.  Skin:    General: Skin is warm and dry.  Neurological:     Mental Status: He is alert and oriented to person, place, and time.     Comments: NO  NEW FOCAL DEFICITS  Psychiatric:        Mood and Affect: Mood normal.     Comments: NORMAL AFFECT        Assessment & Plan:

## 2019-05-29 NOTE — Patient Instructions (Addendum)
USE MIRALAX  TO REDUCE CONSTIPATION.  CONTINUE PROTONIX. TAKE 30 MINUTES PRIOR TO BREAKFAST.  OK TO USE MAALOX OR MLANTA TO CONTROL UPSET STOMACH OR CONSTIPATION.  FOLLOW UP IN THE OFFICE WILL BE SCHEDULED IF NEEDED.  PLEASE CALL WITH QUESTIONS OR CONCERNS.

## 2019-05-29 NOTE — Assessment & Plan Note (Signed)
SYMPTOMS FAIRLY WELL CONTROLLED.  USE MIRALAX  TO REDUCE CONSTIPATION. OK TO USE MAALOX OR MLANTA TO CONTROL UPSET STOMACH OR CONSTIPATION. FOLLOW UP IN THE OFFICE WILL BE SCHEDULED IF NEEDED. PLEASE CALL WITH QUESTIONS OR CONCERNS.

## 2019-05-29 NOTE — Assessment & Plan Note (Signed)
SYMPTOMS FAIRLY WELL CONTROLLED.  CONTINUE PROTONIX. TAKE 30 MINUTES PRIOR TO BREAKFAST. OK TO USE MAALOX OR MLANTA TO CONTROL UPSET STOMACH OR CONSTIPATION. FOLLOW UP IN THE OFFICE WILL BE SCHEDULED IF NEEDED. PLEASE CALL WITH QUESTIONS OR CONCERNS.

## 2019-05-29 NOTE — Progress Notes (Signed)
CC'ED TO PCP 

## 2019-06-30 DIAGNOSIS — N183 Chronic kidney disease, stage 3 unspecified: Secondary | ICD-10-CM | POA: Diagnosis not present

## 2019-06-30 DIAGNOSIS — Z7189 Other specified counseling: Secondary | ICD-10-CM | POA: Diagnosis not present

## 2019-06-30 DIAGNOSIS — I10 Essential (primary) hypertension: Secondary | ICD-10-CM | POA: Diagnosis not present

## 2019-06-30 DIAGNOSIS — I25110 Atherosclerotic heart disease of native coronary artery with unstable angina pectoris: Secondary | ICD-10-CM | POA: Diagnosis not present

## 2019-06-30 DIAGNOSIS — I5032 Chronic diastolic (congestive) heart failure: Secondary | ICD-10-CM | POA: Diagnosis not present

## 2019-09-30 DIAGNOSIS — I25110 Atherosclerotic heart disease of native coronary artery with unstable angina pectoris: Secondary | ICD-10-CM | POA: Diagnosis not present

## 2019-09-30 DIAGNOSIS — I5032 Chronic diastolic (congestive) heart failure: Secondary | ICD-10-CM | POA: Diagnosis not present

## 2019-09-30 DIAGNOSIS — Z7189 Other specified counseling: Secondary | ICD-10-CM | POA: Diagnosis not present

## 2019-09-30 DIAGNOSIS — N183 Chronic kidney disease, stage 3 unspecified: Secondary | ICD-10-CM | POA: Diagnosis not present

## 2019-09-30 DIAGNOSIS — Z9981 Dependence on supplemental oxygen: Secondary | ICD-10-CM | POA: Diagnosis not present

## 2019-10-01 ENCOUNTER — Ambulatory Visit (INDEPENDENT_AMBULATORY_CARE_PROVIDER_SITE_OTHER): Payer: Medicare Other | Admitting: Student

## 2019-10-01 ENCOUNTER — Encounter: Payer: Self-pay | Admitting: *Deleted

## 2019-10-01 ENCOUNTER — Other Ambulatory Visit: Payer: Self-pay

## 2019-10-01 ENCOUNTER — Encounter: Payer: Self-pay | Admitting: Student

## 2019-10-01 VITALS — BP 118/58 | HR 82 | Ht 64.0 in | Wt 160.0 lb

## 2019-10-01 DIAGNOSIS — E785 Hyperlipidemia, unspecified: Secondary | ICD-10-CM

## 2019-10-01 DIAGNOSIS — I272 Pulmonary hypertension, unspecified: Secondary | ICD-10-CM | POA: Diagnosis not present

## 2019-10-01 DIAGNOSIS — I25119 Atherosclerotic heart disease of native coronary artery with unspecified angina pectoris: Secondary | ICD-10-CM | POA: Diagnosis not present

## 2019-10-01 DIAGNOSIS — N1832 Chronic kidney disease, stage 3b: Secondary | ICD-10-CM

## 2019-10-01 DIAGNOSIS — Z79899 Other long term (current) drug therapy: Secondary | ICD-10-CM

## 2019-10-01 DIAGNOSIS — I5032 Chronic diastolic (congestive) heart failure: Secondary | ICD-10-CM

## 2019-10-01 DIAGNOSIS — I10 Essential (primary) hypertension: Secondary | ICD-10-CM

## 2019-10-01 MED ORDER — FUROSEMIDE 40 MG PO TABS: 60.0000 mg | ORAL_TABLET | Freq: Every day | ORAL | 3 refills | Status: DC

## 2019-10-01 MED ORDER — ASPIRIN EC 81 MG PO TBEC: 81.0000 mg | DELAYED_RELEASE_TABLET | Freq: Every day | ORAL | 11 refills | Status: AC

## 2019-10-01 NOTE — Progress Notes (Signed)
Cardiology Office Note    Date:  10/01/2019   ID:  Anthony Galvan, DOB 1921/10/29, MRN [id number]  PCP:  Iona Beard, MD  Cardiologist: Rozann Lesches, MD    Chief Complaint  Patient presents with  . Follow-up    6 month visit    History of Present Illness:    Anthony Galvan is a 86 y.o. male with past medical history of CAD (s/p BMS to LAD in 2014, repeat cath in 10/2016 showing patent stent and 80% distal LCx stenosis not suitable for PCI and medical management recommended),ILD (on 3L [place] at baseline), chronic diastolic CHF,pulmonary HTN, moderate to severe TR, acute PE (diagnosed in 05/2018 - no longer on anticoagulation), HTN, HLD, GERD and Stage 3 CKD who presents to the office today for 69-month follow-up.  He was last examined by Dr. Domenic Polite in 04/2019 and reported that his weight had overall been stable and he denied any recent orthopnea or PND. Weight was at 160 lbs and he did not appear volume overloaded by examination. He was continued on his current cardiac medications and informed to follow-up in 6 months.  In talking with the patient and his caregiver today, he reports overall doing well from a cardiac perspective. He denies any recent chest pain or dyspnea on exertion. He is mostly wheelchair-bound and has an IT trainer wheelchair he utilizes around the house. He denies any specific orthopnea, PND or palpitations. He has continued to experience worsening lower extremity edema and does not elevate his legs frequently. Did not tolerate compression stockings in the past. He does not add salt to his food and does not consume fast food regularly.  He reports good compliance with his current diuretic regimen which consists of Lasix 40 mg daily.  Past Medical History:  Diagnosis Date  . Bronchiectasis ([place]) 06/13/2018  . CAD (coronary artery disease) 08/01/2012   BMS mid LAD; catheterization in July 2018 showed with severe heavily calcified distal circumflex-left PDA stenosis,  not favorable for PCI.  Marland Kitchen Congestive heart failure (CHF) ([place])   . Duodenal ulcer May 2011   on EGD  . Essential hypertension   . GERD (gastroesophageal reflux disease)   . Helicobacter pylori gastritis 2012  . Hyperlipidemia   . ILD (interstitial lung disease) ([place])   . MI (myocardial infarction) ([place]) 1976, 1980, [place] [facility] (Dr. Iona Beard)  . Osteoarthritis   . Pulmonary embolism ([place])   . Pulmonary hypertension ([place])   . S/P colonoscopy 2011   normal, internal hemorrhoids    Past Surgical History:  Procedure Laterality Date  . CARDIAC CATHETERIZATION  01/30/2007   D1 75 %, mid LAD 50%, 50-70% circumflex, 50-70% RCA.(Dr. Adora Fridge)  . CARDIAC CATHETERIZATION  12/11/2001   normal L main, small RCA, dominant LL Cfx with mild diffuse disease, LAD with mid 10-20% stenosis, ramus intermedius with mild diffuse disease (Dr. Jackie Plum)  . CATARACT EXTRACTION    . COLONOSCOPY  OCT 2011 ARS   [facility]   in setting of MI  . ESOPHAGOGASTRODUODENOSCOPY N/A 10/09/2016   Procedure: ESOPHAGOGASTRODUODENOSCOPY (EGD);  Surgeon: Danie Binder, MD;  [facility];  Service: Endoscopy;  Laterality: N/A;  . FEMUR IM NAIL Right 12/01/2016   Procedure: INTRAMEDULLARY (IM) NAIL RIGHT HIP;  Surgeon: Altamese Harding, MD;  Location: [place];  Service: Orthopedics;  Laterality: Right;  . FEMUR IM NAIL Left 05/07/2018  Procedure: INTRAMEDULLARY (IM) NAIL FEMORAL;  Surgeon: Nicholes Stairs, MD;  Location: [place];  Service: Orthopedics;  Laterality: Left;  . LEFT HEART CATH AND CORONARY ANGIOGRAPHY N/A 10/23/2016   Procedure: Left Heart Cath and Coronary Angiography;  Surgeon: Sherren Mocha, MD;  Location: [place] [facility];  Service: Cardiovascular: Two-vessel CAD (left dominant).  Patent BMS in p LAD.  Severe, heavily calcified dCx-LPDA lesion.  Not favorable for PCI.  Marland Kitchen LEFT HEART CATHETERIZATION WITH CORONARY ANGIOGRAM N/A  08/01/2012   Procedure: LEFT HEART CATHETERIZATION WITH CORONARY ANGIOGRAM;  Surgeon: Leonie Man, MD;  Location: [facility] CATH LAB: Mid LAD 99% apple core; D2 ostial 70-80% (2 small for PCI) small nondominant RCA 60-70%. Distal circumflex/L PDA ~50% --> PCI of LAD  . NM MYOCAR PERF WALL MOTION  08/2003   adenosine stress - focal decreased perfusion defect in distal inferior wall, no significant ischemic changes  . PERCUTANEOUS STENT INTERVENTION  08/01/2012   Procedure: PERCUTANEOUS STENT INTERVENTION;  Surgeon: Leonie Man, MD;  Location: [facility] CATH LAB; ;PCI of mid LAD with a VeriFlex Bare Metal Stent 3.0 mm x 12 mm - post-dilated to 3.5 mm.  . TRANSTHORACIC ECHOCARDIOGRAM  10/2016   mild LVH - 60-65%. Gr 1 DD. No RWMA. Mild MR. Mod TR. Mild RA dilation.  PAP ~ 60 mmHg.  Marland Kitchen UPPER GASTROINTESTINAL ENDOSCOPY  08/2009   MELENA, HEMATEMESIS --> DUODENAL ULCER, Bx: H PYLORI POS    Current Medications: Outpatient Medications Prior to Visit  Medication Sig Dispense Refill  . acetaminophen (TYLENOL) 325 MG tablet Take 2 tablets (650 mg total) by mouth 3 (three) times daily as needed (pain).    Marland Kitchen alum & mag hydroxide-simeth (MAALOX/MYLANTA) 200-200-20 MG/5ML suspension Take 30 mLs by mouth every 4 (four) hours as needed for indigestion. 355 mL 0  . atorvastatin (LIPITOR) 80 MG tablet Take 1 tablet (80 mg total) by mouth daily at 6 PM. 30 tablet 5  . calcium carbonate (OS-CAL - DOSED IN MG OF ELEMENTAL CALCIUM) 1250 (500 Ca) MG tablet Take 1 tablet (500 mg of elemental calcium total) by mouth daily with breakfast.    . guaiFENesin (MUCINEX) 600 MG 12 hr tablet Take 1 tablet (600 mg total) by mouth 2 (two) times daily. 30 tablet 0  . ipratropium-albuterol (DUONEB) 0.5-2.5 (3) MG/3ML SOLN Take 3 mLs by nebulization 3 (three) times daily. 360 mL 2  . metoprolol tartrate (LOPRESSOR) 25 MG tablet Take 0.5 tablets (12.5 mg total) by mouth 2 (two) times daily. 30 tablet 0  . Multiple Vitamin (MULTIVITAMIN WITH  MINERALS) TABS tablet Take 1 tablet by mouth daily.    Marland Kitchen NITROSTAT 0.4 MG SL tablet PLACE 1 TABLET UNDER TONGUE EVERY 5 MINUTES FOR 3 DOSES AS NEEDED. 25 tablet 6  . pantoprazole (PROTONIX) 40 MG tablet Take 1 tablet (40 mg total) by mouth daily. 30 tablet 1  . polyethylene glycol (MIRALAX / GLYCOLAX) packet Take 17 g by mouth 2 (two) times daily. (Patient taking differently: Take 17 g by mouth as needed. ) 14 each 0  . Vitamin D, Ergocalciferol, (DRISDOL) 1.25 MG (50000 UT) CAPS capsule Take 1 capsule (50,000 Units total) by mouth every 7 (seven) days. 5 capsule   . aspirin 325 MG tablet Take 1 tablet (325 mg total) by mouth daily. 30 tablet 0  . furosemide (LASIX) 40 MG tablet Take 1 tablet (40 mg total) by mouth daily. 30 tablet 1  . isosorbide dinitrate (ISORDIL) 20 MG tablet Take 1 tablet (  20 mg total) by mouth 3 (three) times daily. (Patient taking differently: Take 60 mg by mouth 3 (three) times daily. ) 90 tablet 1   No facility-administered medications prior to visit.     Allergies:   Lisinopril   Social History   Socioeconomic History  . Marital status: Widowed    Spouse name: Not on file  . Number of children: 3  . Years of education: Not on file  . Highest education level: Not on file  Occupational History  . Not on file  Tobacco Use  . Smoking status: Never Smoker  . Smokeless tobacco: Never Used  Vaping Use  . Vaping Use: Never used  Substance and Sexual Activity  . Alcohol use: No  . Drug use: No  . Sexual activity: Not on file  Other Topics Concern  . Not on file  Social History Narrative   Is a father of 3 with 3 grandchildren 3 great-grandchildren. He lives with one of his 2 grandsons.   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 He was a war 2 veteran in the [place]. 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   He never smoked. He does not  drink alcohol.   He is very active, and a majority. He walks 30+ minutes every day. He also likes to play golf.   Social Determinants of Health   Financial Resource Strain:   . Difficulty of Paying Living Expenses:   Food Insecurity:   . Worried About Charity fundraiser in the Last Year:   . Arboriculturist in the Last Year:   Transportation Needs:   . Film/video editor (Medical):   Marland Kitchen Lack of Transportation (Non-Medical):   Physical Activity:   . Days of Exercise per Week:   . Minutes of Exercise per Session:   Stress:   . Feeling of Stress :   Social Connections:   . Frequency of Communication with Friends and Family:   . Frequency of Social Gatherings with Friends and Family:   . Attends Religious Services:   . Active Member of Clubs or Organizations:   . Attends Archivist Meetings:   Marland Kitchen Marital Status:      Family History:  The patient's family history includes Heart disease in his mother and sister; Heart disease (age of onset: 70) in his brother; Kidney disease in his father.   Review of Systems:   Please see the history of present illness.     General:  No chills, fever, night sweats or weight changes.  Cardiovascular:  No chest pain, orthopnea, palpitations, paroxysmal nocturnal dyspnea. Positive for edema.  Dermatological: No rash, lesions/masses Respiratory: No cough, dyspnea Urologic: No hematuria, dysuria Abdominal:   No nausea, vomiting, diarrhea, bright red blood per rectum, melena, or hematemesis Neurologic:  No visual changes, wkns, changes in mental status. All other systems reviewed and are otherwise negative except as noted above.   Physical Exam:    VS:  BP (!) 118/58   Pulse 82   Ht 5\' 4"  (1.626 m)   Wt 160 lb (72.6 kg)   SpO2 98%   BMI 27.46 kg/m    General: Well developed, well nourished,male appearing in no acute distress. Head: Normocephalic, atraumatic, sclera non-icteric.  Neck: No carotid bruits. JVD not elevated.    Lungs: Respirations regular and  unlabored, without wheezes or rales.  Heart: Regular rate and rhythm. No S3 or S4.  No rubs or gallops appreciated. 2/6 SEM along RUSB.  Abdomen: Soft, non-tender, non-distended. No obvious abdominal masses. Msk:  Strength and tone appear normal for age. No obvious joint deformities or effusions. Extremities: No clubbing or cyanosis. 1+ pitting edema. Distal pedal pulses are 2+ bilaterally. Neuro: Alert and oriented X 3. Moves all extremities spontaneously. No focal deficits noted. Psych:  Responds to questions appropriately with a normal affect. Skin: No rashes or lesions noted  Wt Readings from Last 3 Encounters:  10/01/19 160 lb (72.6 kg)  05/29/19 161 lb (73 kg)  04/29/19 160 lb (72.6 kg)     Studies/Labs Reviewed:   EKG:  EKG is not ordered today.    Recent Labs: 01/25/2019: ALT 16 01/26/2019: Magnesium 2.1 01/28/2019: B Natriuretic Peptide 635.0 04/14/2019: BUN 28; Creatinine, Ser 1.60; Hemoglobin 11.9; Platelets 139; Potassium 4.2; Sodium 141   Lipid Panel    Component Value Date/Time   CHOL 119 10/21/2016 0423   TRIG 87 10/21/2016 0423   HDL 57 10/21/2016 0423   CHOLHDL 2.1 10/21/2016 0423   VLDL 17 10/21/2016 0423   LDLCALC 45 10/21/2016 0423    Additional studies/ records that were reviewed today include:   Cardiac Catheterization: 10/2016 1. Two-vessel coronary artery disease (left dominant) with patency of the stented segment in the proximal LAD and severe heavily calcified distal circumflex/left PDA stenosis unfavorable for PCI  2. Known normal LV function by noninvasive assessment  3. Normal LVEDP  Recommend: Medical therapy for CAD. The distal circumflex/PDA lesion is severely calcified with a distal vessel location that is unfavorable for PCI and I would be very hesitant to use atherectomy in this distal location.   Echocardiogram: 01/2019 IMPRESSIONS    1. Left ventricular ejection fraction, by visual  estimation, is 65 to  70%. The left ventricle has normal function. Normal left ventricular size.  There is no left ventricular hypertrophy.  2. Elevated left ventricular end-diastolic pressure.  3. Left ventricular diastolic Doppler parameters are consistent with  impaired relaxation pattern of LV diastolic filling.  4. Global right ventricle has normal systolic function.The right  ventricular size is moderately enlarged. No increase in right ventricular  wall thickness.  5. Left atrial size was normal.  6. Right atrial size was normal.  7. The mitral valve is normal in structure. No evidence of mitral valve  regurgitation. No evidence of mitral stenosis.  8. The tricuspid valve is normal in structure. Tricuspid valve  regurgitation moderate-severe.  9. The aortic valve is normal in structure. Aortic valve regurgitation is  mild by color flow Doppler. Mild to moderate aortic valve  sclerosis/calcification without any evidence of aortic stenosis.  10. The pulmonic valve was normal in structure. Pulmonic valve  regurgitation is not visualized by color flow Doppler.  11. Moderately elevated pulmonary artery systolic pressure.  12. The tricuspid regurgitant velocity is 3.29 m/s, and with an assumed  right atrial pressure of 10 mmHg, the estimated right ventricular systolic  pressure is moderately elevated at 53.3 mmHg.  13. The inferior vena cava is normal in size with greater than 50%  respiratory variability, suggesting right atrial pressure of 3 mmHg.   Assessment:    1. Coronary artery disease involving native coronary artery of native heart with angina pectoris ([place])   2. Chronic diastolic heart failure ([place])   3. Pulmonary hypertension ([place])   4. Essential hypertension   5. Stage  3b chronic kidney disease   6. Medication management   7. Hyperlipidemia LDL goal <70      Plan:   In order of problems listed above:  1. CAD - He is s/p BMS to LAD in 2014, repeat cath  in 10/2016 showing patent stent and 80% distal LCx stenosis not suitable for PCI and medical management recommended. - He denies any recent chest pain and breathing has been at baseline. Continue with conservative therapy at this time in the setting of his advanced age. - He was listed as being on ASA 325 mg daily and I did confirm with his daughter he has only been taking a baby aspirin. Will update his med list. Continue Isordil 20 mg 3 times daily, Lopressor 12.5 mg twice daily and Atorvastatin 80 mg daily.  2. Chronic Diastolic CHF/Pulmonary HTN - He has experienced worsening lower extremity edema and has 1+ pitting edema on examination today. I suspect this is dependent edema due to him being in a wheelchair throughout a majority of the day. I did speak to his daughter by phone who reports he does not elevate his legs frequently. - He was previously intolerant to compression stockings and remains on Lasix 40 mg daily. Will titrate dosing to 60 mg daily and plan for a repeat BMET in 2 weeks.  - He does have known pulmonary hypertension and moderate to severe TR which is likely due to known ILD.  He remains on 2 L nasal cannula at baseline.  3. HTN - BP is at 118/58 during today's visit. Continue current medication regimen with Isordil 20 mg 3 times daily and Lopressor 12.5 mg twice daily.  4. Stage 3 CKD - Baseline creatinine 1.4 - 1.5. At 1.60 by most recent labs in 03/2019. Will request interval lab work from his PCP which he says was obtained earlier this week. Repeat BMET in 2-3 weeks following dose adjustment of Lasix.   5. HLD - Followed by PCP with goal LDL less than 70. Will request a copy of recent labs. He remains on Atorvastatin 80mg  daily.    Medication Adjustments/Labs and Tests Ordered: Current medicines are reviewed at length with the patient today.  Concerns regarding medicines are outlined above.  Medication changes, Labs and Tests ordered today are listed in the Patient  Instructions below. Patient Instructions  Medication Instructions:   Increase Lasix to 60mg  daily.   Labwork:  BMET in 2-3 weeks to assess kidney function and electrolytes.   Testing/Procedures:  None  Follow-Up:  With [place], PA-C or Dr. Domenic Polite in 6 months  Any Other Special Instructions Will Be Listed Below (If Applicable).   If you need a refill on your cardiac medications before your next appointment, please call your pharmacy.    Signed, Erma Heritage, PA-C  10/01/2019 4:45 PM    [facility] [address] [place], [place] [address] Phone: [phone] Fax: [phone]

## 2019-10-01 NOTE — Patient Instructions (Signed)
Medication Instructions:   Increase Lasix to 60mg  daily.   Labwork:  BMET in 2-3 weeks to assess kidney function and electrolytes.   Testing/Procedures:  None  Follow-Up:  With [place], PA-C or Dr. Domenic Polite in 6 months  Any Other Special Instructions Will Be Listed Below (If Applicable).    If you need a refill on your cardiac medications before your next appointment, please call your pharmacy.

## 2019-11-28 IMAGING — CR DG CHEST 1V PORT
1 series · 1 of 1 positions shown · non-contrast
Comparison: PA and lateral chest 05/07/2018.

CLINICAL DATA: Onset chest pain, cough and shortness of breath
yesterday.

EXAM:
PORTABLE CHEST 1 VIEW

[portable]
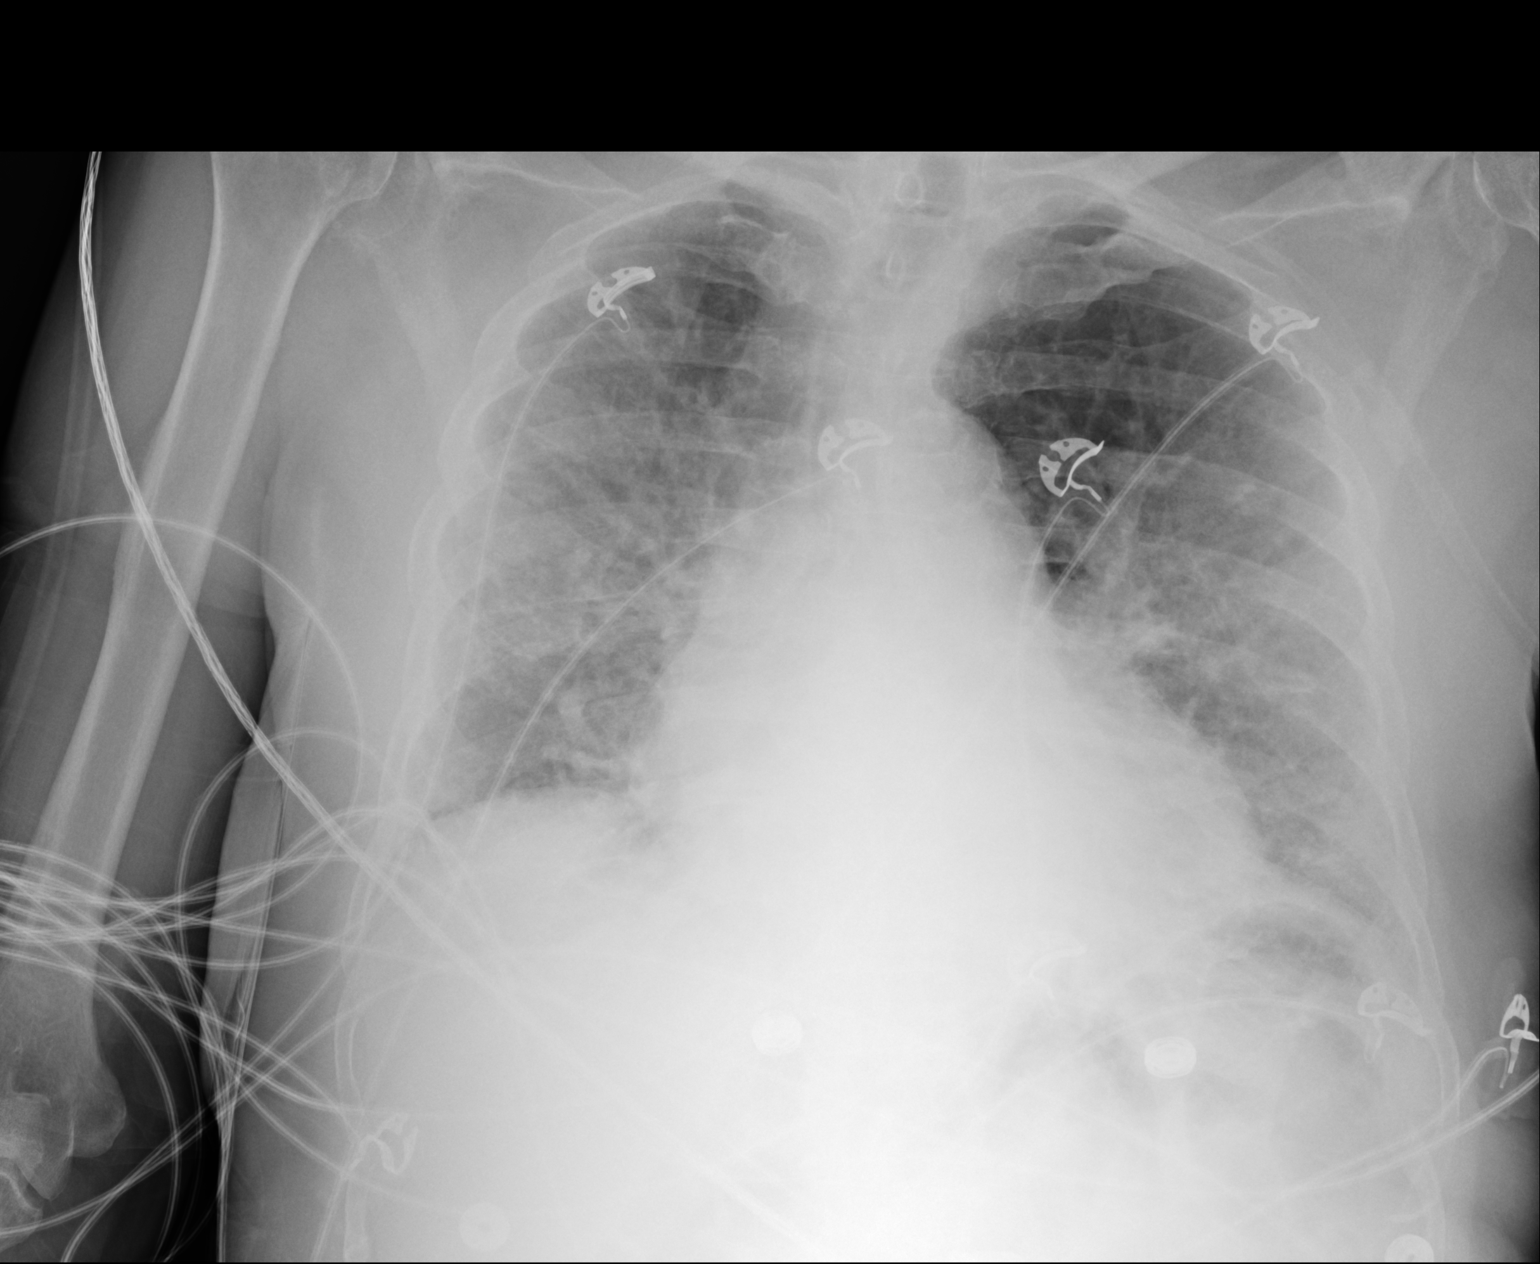

[1 of 1 positions shown; findings below may reference images not displayed]

FINDINGS: There is cardiomegaly and bilateral airspace disease. No
pneumothorax or pleural effusion. Aortic atherosclerosis noted.
IMPRESSION: Bilateral airspace disease most consistent with pulmonary edema in
this patient with cardiomegaly.

## 2020-04-01 ENCOUNTER — Ambulatory Visit: Payer: Medicare Other | Admitting: Cardiology

## 2020-04-01 NOTE — Progress Notes (Deleted)
Cardiology Office Note  Date: 04/01/2020   ID: Anthony Galvan, DOB 09-16-1921, MRN [id number]  PCP:  Iona Beard, MD  Cardiologist:  Rozann Lesches, MD Electrophysiologist:  None   No chief complaint on file.   History of Present Illness: Anthony Galvan is a 86 y.o. male last seen in June by Ms. Strader PA-C.  Past Medical History:  Diagnosis Date  . Bronchiectasis ([place]) 06/13/2018  . CAD (coronary artery disease) 08/01/2012   BMS mid LAD; catheterization in July 2018 showed with severe heavily calcified distal circumflex-left PDA stenosis, not favorable for PCI.  Marland Kitchen Congestive heart failure (CHF) ([place])   . Duodenal ulcer May 2011   on EGD  . Essential hypertension   . GERD (gastroesophageal reflux disease)   . Helicobacter pylori gastritis 2012  . Hyperlipidemia   . ILD (interstitial lung disease) ([place])   . MI (myocardial infarction) ([place]) 1976, 1980, [place] [facility] at [facility] [place] (Dr. Iona Beard)  . Osteoarthritis   . Pulmonary embolism ([place])   . Pulmonary hypertension ([place])   . S/P colonoscopy 2011   normal, internal hemorrhoids    Past Surgical History:  Procedure Laterality Date  . CARDIAC CATHETERIZATION  01/30/2007   D1 75 %, mid LAD 50%, 50-70% circumflex, 50-70% RCA.(Dr. Adora Fridge)  . CARDIAC CATHETERIZATION  12/11/2001   normal L main, small RCA, dominant LL Cfx with mild diffuse disease, LAD with mid 10-20% stenosis, ramus intermedius with mild diffuse disease (Dr. Jackie Plum)  . CATARACT EXTRACTION    . COLONOSCOPY  OCT 2011 ARS   [facility] [place]   in setting of MI  . ESOPHAGOGASTRODUODENOSCOPY N/A 10/09/2016   Procedure: ESOPHAGOGASTRODUODENOSCOPY (EGD);  Surgeon: Danie Binder, MD;  [facility];  Service: Endoscopy;  Laterality: N/A;  . FEMUR IM NAIL Right 12/01/2016   Procedure: INTRAMEDULLARY (IM) NAIL RIGHT HIP;  Surgeon: Altamese Hector, MD;  Location: [place];  Service: Orthopedics;   Laterality: Right;  . FEMUR IM NAIL Left 05/07/2018   Procedure: INTRAMEDULLARY (IM) NAIL FEMORAL;  Surgeon: Nicholes Stairs, MD;  Location: [place];  Service: Orthopedics;  Laterality: Left;  . LEFT HEART CATH AND CORONARY ANGIOGRAPHY N/A 10/23/2016   Procedure: Left Heart Cath and Coronary Angiography;  Surgeon: Sherren Mocha, MD;  Location: [place] CV LAB;  Service: Cardiovascular: Two-vessel CAD (left dominant).  Patent BMS in p LAD.  Severe, heavily calcified dCx-LPDA lesion.  Not favorable for PCI.  Marland Kitchen LEFT HEART CATHETERIZATION WITH CORONARY ANGIOGRAM N/A 08/01/2012   Procedure: LEFT HEART CATHETERIZATION WITH CORONARY ANGIOGRAM;  Surgeon: Leonie Man, MD;  Location: [facility] CATH LAB: Mid LAD 99% apple core; D2 ostial 70-80% (2 small for PCI) small nondominant RCA 60-70%. Distal circumflex/L PDA ~50% --> PCI of LAD  . NM MYOCAR PERF WALL MOTION  08/2003   adenosine stress - focal decreased perfusion defect in distal inferior wall, no significant ischemic changes  . PERCUTANEOUS STENT INTERVENTION  08/01/2012   Procedure: PERCUTANEOUS STENT INTERVENTION;  Surgeon: Leonie Man, MD;  Location: [facility] CATH LAB; ;PCI of mid LAD with a VeriFlex Bare Metal Stent 3.0 mm x 12 mm - post-dilated to 3.5 mm.  . TRANSTHORACIC ECHOCARDIOGRAM  10/2016   mild LVH - 60-65%. Gr 1 DD. No RWMA. Mild MR. Mod TR. Mild RA dilation.  PAP ~ 60 mmHg.  Marland Kitchen UPPER GASTROINTESTINAL ENDOSCOPY  08/2009   MELENA, HEMATEMESIS -->  DUODENAL ULCER, Bx: H PYLORI POS    Current Outpatient Medications  Medication Sig Dispense Refill  . acetaminophen (TYLENOL) 325 MG tablet Take 2 tablets (650 mg total) by mouth 3 (three) times daily as needed (pain).    Marland Kitchen alum & mag hydroxide-simeth (MAALOX/MYLANTA) 200-200-20 MG/5ML suspension Take 30 mLs by mouth every 4 (four) hours as needed for indigestion. 355 mL 0  . aspirin EC 81 MG tablet Take 1 tablet (81 mg total) by mouth daily. Swallow whole. 30 tablet 11  . atorvastatin (LIPITOR)  80 MG tablet Take 1 tablet (80 mg total) by mouth daily at 6 PM. 30 tablet 5  . calcium carbonate (OS-CAL - DOSED IN MG OF ELEMENTAL CALCIUM) 1250 (500 Ca) MG tablet Take 1 tablet (500 mg of elemental calcium total) by mouth daily with breakfast.    . furosemide (LASIX) 40 MG tablet Take 1.5 tablets (60 mg total) by mouth daily. 135 tablet 3  . guaiFENesin (MUCINEX) 600 MG 12 hr tablet Take 1 tablet (600 mg total) by mouth 2 (two) times daily. 30 tablet 0  . ipratropium-albuterol (DUONEB) 0.5-2.5 (3) MG/3ML SOLN Take 3 mLs by nebulization 3 (three) times daily. 360 mL 2  . isosorbide dinitrate (ISORDIL) 20 MG tablet Take 1 tablet (20 mg total) by mouth 3 (three) times daily. (Patient taking differently: Take 60 mg by mouth 3 (three) times daily. ) 90 tablet 1  . metoprolol tartrate (LOPRESSOR) 25 MG tablet Take 0.5 tablets (12.5 mg total) by mouth 2 (two) times daily. 30 tablet 0  . Multiple Vitamin (MULTIVITAMIN WITH MINERALS) TABS tablet Take 1 tablet by mouth daily.    Marland Kitchen NITROSTAT 0.4 MG SL tablet PLACE 1 TABLET UNDER TONGUE EVERY 5 MINUTES FOR 3 DOSES AS NEEDED. 25 tablet 6  . pantoprazole (PROTONIX) 40 MG tablet Take 1 tablet (40 mg total) by mouth daily. 30 tablet 1  . polyethylene glycol (MIRALAX / GLYCOLAX) packet Take 17 g by mouth 2 (two) times daily. (Patient taking differently: Take 17 g by mouth as needed. ) 14 each 0  . Vitamin D, Ergocalciferol, (DRISDOL) 1.25 MG (50000 UT) CAPS capsule Take 1 capsule (50,000 Units total) by mouth every 7 (seven) days. 5 capsule    No current facility-administered medications for this visit.   Allergies:  Lisinopril   Social History: The patient  reports that he has never smoked. He has never used smokeless tobacco. He reports that he does not drink alcohol and does not use drugs.   Family History: The patient's family history includes Heart disease in his mother and sister; Heart disease (age of onset: 75) in his brother; Kidney disease in his  father.   ROS:  Please see the history of present illness. Otherwise, complete review of systems is positive for {NONE DEFAULTED:18576::"none"}.  All other systems are reviewed and negative.   Physical Exam: VS:  There were no vitals taken for this visit., BMI There is no height or weight on file to calculate BMI.  Wt Readings from Last 3 Encounters:  10/01/19 160 lb (72.6 kg)  05/29/19 161 lb (73 kg)  04/29/19 160 lb (72.6 kg)    General: Patient appears comfortable at rest. HEENT: Conjunctiva and lids normal, oropharynx clear with moist mucosa. Neck: Supple, no elevated JVP or carotid bruits, no thyromegaly. Lungs: Clear to auscultation, nonlabored breathing at rest. Cardiac: Regular rate and rhythm, no S3 or significant systolic murmur, no pericardial rub. Abdomen: Soft, nontender, no hepatomegaly, bowel sounds present, no  guarding or rebound. Extremities: No pitting edema, distal pulses 2+. Skin: Warm and dry. Musculoskeletal: No kyphosis. Neuropsychiatric: Alert and oriented x3, affect grossly appropriate.  ECG:  An ECG dated 04/14/2019 was personally reviewed today and demonstrated:  Sinus rhythm with left atrial enlargement and left anterior fascicular block.  Recent Labwork: 04/14/2019: BUN 28; Creatinine, Ser 1.60; Hemoglobin 11.9; Platelets 139; Potassium 4.2; Sodium 141     Component Value Date/Time   CHOL 119 10/21/2016 0423   TRIG 87 10/21/2016 0423   HDL 57 10/21/2016 0423   CHOLHDL 2.1 10/21/2016 0423   VLDL 17 10/21/2016 0423   LDLCALC 45 10/21/2016 0423    Other Studies Reviewed Today:  Echocardiogram 01/25/2019: 1. Left ventricular ejection fraction, by visual estimation, is 65 to  70%. The left ventricle has normal function. Normal left ventricular size.  There is no left ventricular hypertrophy.  2. Elevated left ventricular end-diastolic pressure.  3. Left ventricular diastolic Doppler parameters are consistent with  impaired relaxation pattern of  LV diastolic filling.  4. Global right ventricle has normal systolic function.The right  ventricular size is moderately enlarged. No increase in right ventricular  wall thickness.  5. Left atrial size was normal.  6. Right atrial size was normal.  7. The mitral valve is normal in structure. No evidence of mitral valve  regurgitation. No evidence of mitral stenosis.  8. The tricuspid valve is normal in structure. Tricuspid valve  regurgitation moderate-severe.  9. The aortic valve is normal in structure. Aortic valve regurgitation is  mild by color flow Doppler. Mild to moderate aortic valve  sclerosis/calcification without any evidence of aortic stenosis.  10. The pulmonic valve was normal in structure. Pulmonic valve  regurgitation is not visualized by color flow Doppler.  11. Moderately elevated pulmonary artery systolic pressure.  12. The tricuspid regurgitant velocity is 3.29 m/s, and with an assumed  right atrial pressure of 10 mmHg, the estimated right ventricular systolic  pressure is moderately elevated at 53.3 mmHg.  13. The inferior vena cava is normal in size with greater than 50%  respiratory variability, suggesting right atrial pressure of 3 mmHg.   Lower extremity venous Dopplers 01/28/2019: IMPRESSION: No evidence of DVT within either lower extremity.  Assessment and Plan:   Medication Adjustments/Labs and Tests Ordered: Current medicines are reviewed at length with the patient today.  Concerns regarding medicines are outlined above.   Tests Ordered: No orders of the defined types were placed in this encounter.   Medication Changes: No orders of the defined types were placed in this encounter.   Disposition:  Follow up {follow up:15908}  Signed, Satira Sark, MD, [facility] 04/01/2020 10:18 AM    [facility] [place]. [address] [place], [place] [address] Phone: [phone]; Fax: [phone]

## 2020-04-12 ENCOUNTER — Other Ambulatory Visit: Payer: Self-pay

## 2020-04-12 ENCOUNTER — Emergency Department (HOSPITAL_COMMUNITY): Payer: Medicare Other

## 2020-04-12 ENCOUNTER — Encounter (HOSPITAL_COMMUNITY): Payer: Self-pay

## 2020-04-12 ENCOUNTER — Inpatient Hospital Stay (HOSPITAL_COMMUNITY)
Admission: EM | Admit: 2020-04-12 | Discharge: 2020-04-19 | DRG: 280 | Disposition: A | Discharge status: Home Health | Payer: Medicare Other | Attending: Internal Medicine | Admitting: Internal Medicine

## 2020-04-12 DIAGNOSIS — I249 Acute ischemic heart disease, unspecified: Secondary | ICD-10-CM | POA: Diagnosis present

## 2020-04-12 DIAGNOSIS — N183 Chronic kidney disease, stage 3 unspecified: Secondary | ICD-10-CM | POA: Diagnosis not present

## 2020-04-12 DIAGNOSIS — J849 Interstitial pulmonary disease, unspecified: Secondary | ICD-10-CM | POA: Diagnosis present

## 2020-04-12 DIAGNOSIS — N179 Acute kidney failure, unspecified: Secondary | ICD-10-CM | POA: Diagnosis not present

## 2020-04-12 DIAGNOSIS — Z993 Dependence on wheelchair: Secondary | ICD-10-CM | POA: Diagnosis not present

## 2020-04-12 DIAGNOSIS — I272 Pulmonary hypertension, unspecified: Secondary | ICD-10-CM | POA: Diagnosis not present

## 2020-04-12 DIAGNOSIS — D6489 Other specified anemias: Secondary | ICD-10-CM | POA: Diagnosis not present

## 2020-04-12 DIAGNOSIS — I252 Old myocardial infarction: Secondary | ICD-10-CM | POA: Diagnosis not present

## 2020-04-12 DIAGNOSIS — Z86711 Personal history of pulmonary embolism: Secondary | ICD-10-CM | POA: Diagnosis not present

## 2020-04-12 DIAGNOSIS — N289 Disorder of kidney and ureter, unspecified: Secondary | ICD-10-CM | POA: Diagnosis not present

## 2020-04-12 DIAGNOSIS — Z20822 Contact with and (suspected) exposure to covid-19: Secondary | ICD-10-CM | POA: Diagnosis present

## 2020-04-12 DIAGNOSIS — Z955 Presence of coronary angioplasty implant and graft: Secondary | ICD-10-CM | POA: Diagnosis not present

## 2020-04-12 DIAGNOSIS — K279 Peptic ulcer, site unspecified, unspecified as acute or chronic, without hemorrhage or perforation: Secondary | ICD-10-CM | POA: Diagnosis present

## 2020-04-12 DIAGNOSIS — I5032 Chronic diastolic (congestive) heart failure: Secondary | ICD-10-CM | POA: Diagnosis not present

## 2020-04-12 DIAGNOSIS — R079 Chest pain, unspecified: Secondary | ICD-10-CM | POA: Diagnosis not present

## 2020-04-12 DIAGNOSIS — J90 Pleural effusion, not elsewhere classified: Secondary | ICD-10-CM | POA: Diagnosis not present

## 2020-04-12 DIAGNOSIS — I071 Rheumatic tricuspid insufficiency: Secondary | ICD-10-CM | POA: Diagnosis not present

## 2020-04-12 DIAGNOSIS — Z743 Need for continuous supervision: Secondary | ICD-10-CM | POA: Diagnosis not present

## 2020-04-12 DIAGNOSIS — I2510 Atherosclerotic heart disease of native coronary artery without angina pectoris: Secondary | ICD-10-CM

## 2020-04-12 DIAGNOSIS — I132 Hypertensive heart and chronic kidney disease with heart failure and with stage 5 chronic kidney disease, or end stage renal disease: Secondary | ICD-10-CM | POA: Diagnosis not present

## 2020-04-12 DIAGNOSIS — S270XXA Traumatic pneumothorax, initial encounter: Secondary | ICD-10-CM

## 2020-04-12 DIAGNOSIS — R17 Unspecified jaundice: Secondary | ICD-10-CM | POA: Diagnosis present

## 2020-04-12 DIAGNOSIS — I5033 Acute on chronic diastolic (congestive) heart failure: Secondary | ICD-10-CM | POA: Diagnosis present

## 2020-04-12 DIAGNOSIS — I214 Non-ST elevation (NSTEMI) myocardial infarction: Principal | ICD-10-CM | POA: Diagnosis present

## 2020-04-12 DIAGNOSIS — Z79899 Other long term (current) drug therapy: Secondary | ICD-10-CM | POA: Diagnosis not present

## 2020-04-12 DIAGNOSIS — E785 Hyperlipidemia, unspecified: Secondary | ICD-10-CM | POA: Diagnosis not present

## 2020-04-12 DIAGNOSIS — S2231XA Fracture of one rib, right side, initial encounter for closed fracture: Secondary | ICD-10-CM | POA: Diagnosis not present

## 2020-04-12 DIAGNOSIS — I361 Nonrheumatic tricuspid (valve) insufficiency: Secondary | ICD-10-CM | POA: Diagnosis not present

## 2020-04-12 DIAGNOSIS — Z7982 Long term (current) use of aspirin: Secondary | ICD-10-CM | POA: Diagnosis not present

## 2020-04-12 DIAGNOSIS — S2241XA Multiple fractures of ribs, right side, initial encounter for closed fracture: Secondary | ICD-10-CM

## 2020-04-12 DIAGNOSIS — K219 Gastro-esophageal reflux disease without esophagitis: Secondary | ICD-10-CM | POA: Diagnosis not present

## 2020-04-12 DIAGNOSIS — R0689 Other abnormalities of breathing: Secondary | ICD-10-CM | POA: Diagnosis not present

## 2020-04-12 DIAGNOSIS — N185 Chronic kidney disease, stage 5: Secondary | ICD-10-CM | POA: Diagnosis present

## 2020-04-12 DIAGNOSIS — R069 Unspecified abnormalities of breathing: Secondary | ICD-10-CM | POA: Diagnosis not present

## 2020-04-12 DIAGNOSIS — D649 Anemia, unspecified: Secondary | ICD-10-CM | POA: Diagnosis present

## 2020-04-12 DIAGNOSIS — J9611 Chronic respiratory failure with hypoxia: Secondary | ICD-10-CM | POA: Diagnosis present

## 2020-04-12 DIAGNOSIS — R0789 Other chest pain: Secondary | ICD-10-CM | POA: Diagnosis not present

## 2020-04-12 DIAGNOSIS — I10 Essential (primary) hypertension: Secondary | ICD-10-CM | POA: Diagnosis present

## 2020-04-12 DIAGNOSIS — Z9861 Coronary angioplasty status: Secondary | ICD-10-CM

## 2020-04-12 LAB — COMPREHENSIVE METABOLIC PANEL
ALT: 23 U/L (ref 0–44)
AST: 39 U/L (ref 15–41)
Albumin: 3.5 g/dL (ref 3.5–5.0)
Alkaline Phosphatase: 67 U/L (ref 38–126)
Anion gap: 14 (ref 5–15)
BUN: 58 mg/dL — ABNORMAL HIGH (ref 8–23)
CO2: 21 mmol/L — ABNORMAL LOW (ref 22–32)
Calcium: 8.8 mg/dL — ABNORMAL LOW (ref 8.9–10.3)
Chloride: 101 mmol/L (ref 98–111)
Creatinine, Ser: 3.78 mg/dL — ABNORMAL HIGH (ref 0.61–1.24)
GFR, Estimated: 14 mL/min — ABNORMAL LOW (ref >60)
Glucose, Bld: 83 mg/dL (ref 70–99)
Potassium: 4.1 mmol/L (ref 3.5–5.1)
Sodium: 136 mmol/L (ref 135–145)
Total Bilirubin: 2 mg/dL — ABNORMAL HIGH (ref 0.3–1.2)
Total Protein: 6.8 g/dL (ref 6.5–8.1)

## 2020-04-12 LAB — CBC WITH DIFFERENTIAL/PLATELET
Abs Immature Granulocytes: 0.04 10*3/uL (ref 0.00–0.07)
Basophils Absolute: 0.1 10*3/uL (ref 0.0–0.1)
Basophils Relative: 1 %
Eosinophils Absolute: 0.1 10*3/uL (ref 0.0–0.5)
Eosinophils Relative: 1 %
HCT: 39.7 % (ref 39.0–52.0)
Hemoglobin: 12.3 g/dL — ABNORMAL LOW (ref 13.0–17.0)
Immature Granulocytes: 0 %
Lymphocytes Relative: 25 %
Lymphs Abs: 2.5 10*3/uL (ref 0.7–4.0)
MCH: 29.8 pg (ref 26.0–34.0)
MCHC: 31 g/dL (ref 30.0–36.0)
MCV: 96.1 fL (ref 80.0–100.0)
Monocytes Absolute: 1 10*3/uL (ref 0.1–1.0)
Monocytes Relative: 10 %
Neutro Abs: 6.2 10*3/uL (ref 1.7–7.7)
Neutrophils Relative %: 63 %
Platelets: 178 10*3/uL (ref 150–400)
RBC: 4.13 MIL/uL — ABNORMAL LOW (ref 4.22–5.81)
RDW: 16.4 % — ABNORMAL HIGH (ref 11.5–15.5)
WBC: 9.8 10*3/uL (ref 4.0–10.5)
nRBC: 0 % (ref 0.0–0.2)

## 2020-04-12 LAB — RESP PANEL BY RT-PCR (FLU A&B, COVID) ARPGX2
Influenza A by PCR: NEGATIVE
Influenza B by PCR: NEGATIVE
SARS Coronavirus 2 by RT PCR: NEGATIVE

## 2020-04-12 LAB — URINALYSIS, ROUTINE W REFLEX MICROSCOPIC
Bilirubin Urine: NEGATIVE
Glucose, UA: NEGATIVE mg/dL
Hgb urine dipstick: NEGATIVE
Ketones, ur: NEGATIVE mg/dL
Leukocytes,Ua: NEGATIVE
Nitrite: NEGATIVE
Protein, ur: 100 mg/dL — AB
Specific Gravity, Urine: 1.016 (ref 1.005–1.030)
pH: 5 (ref 5.0–8.0)

## 2020-04-12 LAB — TROPONIN I (HIGH SENSITIVITY)
Troponin I (High Sensitivity): 271 ng/L (ref <18)
Troponin I (High Sensitivity): 248 ng/L (ref <18)

## 2020-04-12 LAB — BRAIN NATRIURETIC PEPTIDE: B Natriuretic Peptide: 437 pg/mL — ABNORMAL HIGH (ref 0.0–100.0)

## 2020-04-12 MED ORDER — ACETAMINOPHEN 325 MG PO TABS
650.0000 mg | ORAL_TABLET | Freq: Four times a day (QID) | ORAL | Status: DC | PRN
  Administered 2020-04-15: 650 mg via ORAL
  Filled 2020-04-12: qty 2

## 2020-04-12 MED ORDER — ACETAMINOPHEN 650 MG RE SUPP: 650.0000 mg | Freq: Four times a day (QID) | RECTAL | Status: DC | PRN

## 2020-04-12 MED ORDER — ONDANSETRON HCL 4 MG/2ML IJ SOLN
4.0000 mg | Freq: Once | INTRAMUSCULAR | Status: AC
  Administered 2020-04-12: 4 mg via INTRAVENOUS
  Filled 2020-04-12: qty 2

## 2020-04-12 MED ORDER — NITROGLYCERIN IN D5W 200-5 MCG/ML-% IV SOLN
5.0000 ug/min | INTRAVENOUS | Status: DC
  Administered 2020-04-12: 5 ug/min via INTRAVENOUS
  Administered 2020-04-14: 40 ug/min via INTRAVENOUS
  Filled 2020-04-12 (×3): qty 250

## 2020-04-12 MED ORDER — HEPARIN (PORCINE) 25000 UT/250ML-% IV SOLN
750.0000 [IU]/h | INTRAVENOUS | Status: AC
  Administered 2020-04-12: 20:00:00 850 [IU]/h via INTRAVENOUS
  Filled 2020-04-12 (×2): qty 250

## 2020-04-12 MED ORDER — PANTOPRAZOLE SODIUM 40 MG IV SOLR
40.0000 mg | Freq: Once | INTRAVENOUS | Status: AC
  Administered 2020-04-12: 22:00:00 40 mg via INTRAVENOUS
  Filled 2020-04-12: qty 40

## 2020-04-12 MED ORDER — ONDANSETRON HCL 4 MG/2ML IJ SOLN: 4.0000 mg | Freq: Four times a day (QID) | INTRAMUSCULAR | Status: DC | PRN

## 2020-04-12 MED ORDER — HEPARIN (PORCINE) 25000 UT/250ML-% IV SOLN: 12.0000 [IU]/kg/h | INTRAVENOUS | Status: DC

## 2020-04-12 MED ORDER — ONDANSETRON HCL 4 MG PO TABS
4.0000 mg | ORAL_TABLET | Freq: Four times a day (QID) | ORAL | Status: DC | PRN
  Administered 2020-04-12: 22:00:00 4 mg via ORAL
  Filled 2020-04-12: qty 1

## 2020-04-12 MED ORDER — MORPHINE SULFATE (PF) 2 MG/ML IV SOLN
2.0000 mg | Freq: Once | INTRAVENOUS | Status: AC
  Administered 2020-04-12: 2 mg via INTRAVENOUS
  Filled 2020-04-12: qty 1

## 2020-04-12 MED ORDER — HEPARIN SODIUM (PORCINE) 5000 UNIT/ML IJ SOLN
4000.0000 [IU] | Freq: Once | INTRAMUSCULAR | Status: AC
  Administered 2020-04-12: 20:00:00 4000 [IU] via INTRAVENOUS

## 2020-04-12 MED ORDER — SODIUM CHLORIDE 0.9 % IV BOLUS
500.0000 mL | Freq: Once | INTRAVENOUS | Status: AC
  Administered 2020-04-12: 20:00:00 500 mL via INTRAVENOUS

## 2020-04-12 NOTE — ED Notes (Signed)
Blood drawn via 23 gauge butterfly from L forearm.

## 2020-04-12 NOTE — ED Provider Notes (Signed)
   Patient signed out to me by Evalee [place], PA-C pending completion of work-up and consultation with cardiology.  Patient here with a known history of coronary artery disease and prior cardiac catheterization in 2018 that showed calcifications of the circumflex.  He developed chest pressure this morning and took 4 baby aspirin and 2 sublingual nitroglycerin without significant relief.  He contacted his PCP at the [place] in [place] and was advised to come to the emergency department for further evaluation.  Upon arrival, he was placed on nitro glycerin drip.  Initial troponin at 271.  He was then started on a heparin drip as well.  Also, he has AKI and his creatinine level is 3.78.  This is significantly elevated above baseline.  BNP 437, CXR negative for heart failure  Pt followed by [facility] cards and cardiology at [facility]   consulted cardiology, Dr. Radford Pax and discussed findings.  She advised patient is appropriate for admission here to medicine.    2000  Consulted triad hospitalist, Dr. Olevia Bowens who agrees to admit   Kem Parkinson, PA-C 04/12/20 2016    Hayden Rasmussen, MD 04/13/20 1007

## 2020-04-12 NOTE — ED Triage Notes (Signed)
Pt to er via ems for chest pain, pt states that when he woke this am he was feeling ok, then he started having some chest pain and felt cold.  Pt states that he is normally on 4L via [place] at home.

## 2020-04-12 NOTE — ED Notes (Signed)
Iv placement unsuccessful, blood drawn, second rn at bedside for attempt, blood taken to lab. Pt remains on cardiac monitor in sinus rhythm, O2 sat monitor pt on 4L via [place]

## 2020-04-12 NOTE — ED Provider Notes (Addendum)
[facility] EMERGENCY DEPARTMENT Provider Note   CSN: [id number] Arrival date & time: 04/12/20  1309     History Chief Complaint  Patient presents with  . Chest Pain    Anthony Galvan is a 86 y.o. male with a history of CAD, patient stating multiple MIs, last catheterization July 2018 which showed heavy circumflex calcification and stenosis not favorable to PCI presenting for evaluation of left-sided chest pain described as pressure that radiates into his left shoulder along with increased shortness of breath and nausea. He awoke symptom-free today, around 8 AM he developed his symptoms which have been persistent. He called his PCP at the [place] who recommended baby aspirin of which he took 4 baby aspirin prior to arrival. He also took 2 sublingual nitroglycerin tablets and had transient improvement in his symptoms. He endorses nausea without emesis. He denies abdominal pain. He does have shortness of breath at baseline is on 4 L nasal cannula chronically, but does endorse increased shortness of breath since his symptoms began today. He has found no alleviators for symptoms.    HPI     Past Medical History:  Diagnosis Date  . Bronchiectasis ([place]) 06/13/2018  . CAD (coronary artery disease) 08/01/2012   BMS mid LAD; catheterization in July 2018 showed with severe heavily calcified distal circumflex-left PDA stenosis, not favorable for PCI.  Marland Kitchen Congestive heart failure (CHF) ([place])   . Duodenal ulcer May 2011   on EGD  . Essential hypertension   . GERD (gastroesophageal reflux disease)   . Helicobacter pylori gastritis 2012  . Hyperlipidemia   . ILD (interstitial lung disease) ([place])   . MI (myocardial infarction) ([place]) 1976, 1980, [place] for at [facility] and [place] (Dr. Iona Beard)  . Osteoarthritis   . Pulmonary embolism ([place])   . Pulmonary hypertension ([place])   . S/P colonoscopy 2011   normal, internal hemorrhoids    Patient Active Problem List   Diagnosis Date  Noted  . Constipation 05/29/2019  . Acute on chronic respiratory failure ([place])   . Cor pulmonale (chronic) ([place]) 01/26/2019  . Chronic respiratory failure with hypoxia ([place]) 01/25/2019  . Bronchiectasis ([place]) 01/25/2019  . Acute CHF (congestive heart failure) ([place]) 01/24/2019  . Anemia 01/24/2019  . Thrombocytopenia ([place]) 01/24/2019  . Interstitial lung disease ([place]) 06/15/2018  . Pulmonary hypertension ([place]) 06/15/2018  . Acute pulmonary embolus ([place]) 06/14/2018  . Acute on chronic diastolic CHF (congestive heart failure) ([place]) 06/12/2018  . Acute respiratory failure with hypoxia ([place]) 06/12/2018  . Fracture 05/07/2018  . Closed displaced intertrochanteric fracture of left femur ([place])   . Coronary artery disease involving native heart without angina pectoris   . Dyspepsia 01/18/2017  . Closed disp intertrochanteric fracture of right femur with nonunion 11/30/2016  . PUD (peptic ulcer disease)   . History of coronary artery stent placement 10/08/2016  . Acute on chronic renal failure ([place]) 10/08/2016  . CKD (chronic kidney disease) stage 3, GFR 30-59 ml/min (HCC) 10/08/2016  . Bloating 09/03/2013  . CAD S/P percutaneous coronary angioplasty   . MI (myocardial infarction) ([place])   . Presence of bare metal stent in LAD coronary artery - VerifFlex BMS 3.0 mm x 12 mm - post-dilated to 3.5 mm 08/01/2012  . Unstable angina ([place]) 07/30/2012  . Gastritis 12/15/2010  . Nausea 08/30/2010  . Hyperlipidemia with target low density lipoprotein (LDL) cholesterol less than 70 mg/dL 05/21/2006  . CATARACT NOS 05/21/2006  . Essential hypertension 05/21/2006  .  MYOCARDIAL INFARCTION, HX OF 05/21/2006  . GERD 05/21/2006  . OVERACTIVE BLADDER 05/21/2006  . OSTEOARTHRITIS 05/21/2006  . URINARY INCONTINENCE 05/21/2006    Past Surgical History:  Procedure Laterality Date  . CARDIAC CATHETERIZATION  01/30/2007   D1 75 %, mid LAD 50%, 50-70% circumflex, 50-70% RCA.(Dr. Adora Fridge)  . CARDIAC  CATHETERIZATION  12/11/2001   normal L main, small RCA, dominant LL Cfx with mild diffuse disease, LAD with mid 10-20% stenosis, ramus intermedius with mild diffuse disease (Dr. Jackie Plum)  . CATARACT EXTRACTION    . COLONOSCOPY  OCT 2011 ARS   [facility] [place]   in setting of MI  . ESOPHAGOGASTRODUODENOSCOPY N/A 10/09/2016   Procedure: ESOPHAGOGASTRODUODENOSCOPY (EGD);  Surgeon: Danie Binder, MD;  [facility];  Service: Endoscopy;  Laterality: N/A;  . FEMUR IM NAIL Right 12/01/2016   Procedure: INTRAMEDULLARY (IM) NAIL RIGHT HIP;  Surgeon: Altamese [place], MD;  Location: [place];  Service: Orthopedics;  Laterality: Right;  . FEMUR IM NAIL Left 05/07/2018   Procedure: INTRAMEDULLARY (IM) NAIL FEMORAL;  Surgeon: Nicholes Stairs, MD;  Location: [facility];  Service: Orthopedics;  Laterality: Left;  . LEFT HEART CATH AND CORONARY ANGIOGRAPHY N/A 10/23/2016   Procedure: Left Heart Cath and Coronary Angiography;  Surgeon: Sherren Mocha, MD;  Location: [place] [facility] LAB;  Service: Cardiovascular: Two-vessel CAD (left dominant).  Patent BMS in p LAD.  Severe, heavily calcified dCx-LPDA lesion.  Not favorable for PCI.  Marland Kitchen LEFT HEART CATHETERIZATION WITH CORONARY ANGIOGRAM N/A 08/01/2012   Procedure: LEFT HEART CATHETERIZATION WITH CORONARY ANGIOGRAM;  Surgeon: Leonie Man, MD;  Location: [facility] CATH LAB: Mid LAD 99% apple core; D2 ostial 70-80% (2 small for PCI) small nondominant RCA 60-70%. Distal circumflex/L PDA ~50% --> PCI of LAD  . NM MYOCAR PERF WALL MOTION  08/2003   adenosine stress - focal decreased perfusion defect in distal inferior wall, no significant ischemic changes  . PERCUTANEOUS STENT INTERVENTION  08/01/2012   Procedure: PERCUTANEOUS STENT INTERVENTION;  Surgeon: Leonie Man, MD;  Location: [facility] CATH LAB; ;PCI of mid LAD with a VeriFlex Bare Metal Stent 3.0 mm x 12 mm - post-dilated to 3.5 mm.  . TRANSTHORACIC ECHOCARDIOGRAM  10/2016   mild LVH -  60-65%. Gr 1 DD. No RWMA. Mild MR. Mod TR. Mild RA dilation.  PAP ~ 60 mmHg.  Marland Kitchen UPPER GASTROINTESTINAL ENDOSCOPY  08/2009   MELENA, HEMATEMESIS --> DUODENAL ULCER, Bx: H PYLORI POS       Family History  Problem Relation Age of Onset  . Heart disease Mother   . Kidney disease Father   . Heart disease Brother 67  . Heart disease Sister   . Colon cancer Neg Hx     Social History   Tobacco Use  . Smoking status: Never Smoker  . Smokeless tobacco: Never Used  Vaping Use  . Vaping Use: Never used  Substance Use Topics  . Alcohol use: No  . Drug use: No    Home Medications Prior to Admission medications   Medication Sig Start Date End Date Taking? Authorizing Provider  acetaminophen (TYLENOL) 325 MG tablet Take 2 tablets (650 mg total) by mouth 3 (three) times daily as needed (pain). 12/04/16  Yes Ainsley Spinner, PA-C  alum & mag hydroxide-simeth (MAALOX/MYLANTA) 200-200-20 MG/5ML suspension Take 30 mLs by mouth every 4 (four) hours as needed for indigestion. 01/29/19   Murlean Iba, MD  aspirin EC 81  MG tablet Take 1 tablet (81 mg total) by mouth daily. Swallow whole. 10/01/19   Strader, Fransisco Hertz, PA-C  atorvastatin (LIPITOR) 80 MG tablet Take 1 tablet (80 mg total) by mouth daily at 6 PM. 08/02/12   Brett Canales, PA-C  calcium carbonate (OS-CAL - DOSED IN MG OF ELEMENTAL CALCIUM) 1250 (500 Ca) MG tablet Take 1 tablet (500 mg of elemental calcium total) by mouth daily with breakfast. 05/13/18   Mullis, Kiersten P, DO  furosemide (LASIX) 40 MG tablet Take 1.5 tablets (60 mg total) by mouth daily. 10/01/19   Strader, Fransisco Hertz, PA-C  guaiFENesin (MUCINEX) 600 MG 12 hr tablet Take 1 tablet (600 mg total) by mouth 2 (two) times daily. 06/15/18   Kathie Dike, MD  ipratropium-albuterol (DUONEB) 0.5-2.5 (3) MG/3ML SOLN Take 3 mLs by nebulization 3 (three) times daily. 01/29/19   Johnson, Clanford L, MD  isosorbide dinitrate (ISORDIL) 20 MG tablet Take 1 tablet (20 mg total) by mouth  3 (three) times daily. Patient taking differently: Take 60 mg by mouth 3 (three) times daily.  01/29/19 05/29/19  Johnson, Clanford L, MD  metoprolol tartrate (LOPRESSOR) 25 MG tablet Take 0.5 tablets (12.5 mg total) by mouth 2 (two) times daily. 05/13/18   Mullis, Kiersten P, DO  Multiple Vitamin (MULTIVITAMIN WITH MINERALS) TABS tablet Take 1 tablet by mouth daily.    [provider]  NITROSTAT 0.4 MG SL tablet PLACE 1 TABLET UNDER TONGUE EVERY 5 MINUTES FOR 3 DOSES AS NEEDED. 11/17/13   Leonie Man, MD  pantoprazole (PROTONIX) 40 MG tablet Take 1 tablet (40 mg total) by mouth daily. 01/30/19   Johnson, Clanford L, MD  polyethylene glycol (MIRALAX / GLYCOLAX) packet Take 17 g by mouth 2 (two) times daily. Patient taking differently: Take 17 g by mouth as needed.  05/13/18   Mullis, Kiersten P, DO  Vitamin D, Ergocalciferol, (DRISDOL) 1.25 MG (50000 UT) CAPS capsule Take 1 capsule (50,000 Units total) by mouth every 7 (seven) days. 05/16/18   Mullis, Kiersten P, DO    Allergies    Lisinopril  Review of Systems   Review of Systems  Constitutional: Negative for chills and fever.  HENT: Negative for congestion and sore throat.   Eyes: Negative.   Respiratory: Positive for chest tightness and shortness of breath. Negative for cough.   Cardiovascular: Positive for chest pain. Negative for palpitations.  Gastrointestinal: Positive for nausea. Negative for abdominal pain and vomiting.  Genitourinary: Negative.   Musculoskeletal: Negative for arthralgias, joint swelling and neck pain.  Skin: Negative.  Negative for rash and wound.  Neurological: Negative for dizziness, weakness, light-headedness, numbness and headaches.  Psychiatric/Behavioral: Negative.   All other systems reviewed and are negative.   Physical Exam Updated Vital Signs BP 131/71 (BP Location: Left Arm)   Pulse 71   Temp 97.6 F (36.4 C) (Oral)   Resp 18   Ht 5\' 4"  (1.626 m)   Wt 68 kg   SpO2 100%   BMI 25.75  kg/m   Physical Exam Vitals and nursing note reviewed.  Constitutional:      Appearance: He is well-developed and well-nourished.  HENT:     Head: Normocephalic and atraumatic.  Eyes:     Conjunctiva/sclera: Conjunctivae normal.  Cardiovascular:     Rate and Rhythm: Normal rate and regular rhythm.     Pulses: Intact distal pulses.     Heart sounds: Murmur heard.   Systolic murmur is present.   Pulmonary:  Effort: Pulmonary effort is normal.     Breath sounds: Normal breath sounds. No wheezing or rhonchi.  Abdominal:     General: Bowel sounds are normal.     Palpations: Abdomen is soft.     Tenderness: There is no abdominal tenderness.  Musculoskeletal:        General: Normal range of motion.     Cervical back: Normal range of motion.     Right lower leg: No edema.     Left lower leg: No edema.  Skin:    General: Skin is warm and dry.  Neurological:     Mental Status: He is alert.  Psychiatric:        Mood and Affect: Mood and affect normal.     ED Results / Procedures / Treatments   Labs (all labs ordered are listed, but only abnormal results are displayed) Labs Reviewed  CBC WITH DIFFERENTIAL/PLATELET - Abnormal; Notable for the following components:      Result Value   RBC 4.13 (*)    Hemoglobin 12.3 (*)    RDW 16.4 (*)    All other components within normal limits  URINALYSIS, ROUTINE W REFLEX MICROSCOPIC - Abnormal; Notable for the following components:   Color, Urine AMBER (*)    APPearance HAZY (*)    Protein, ur 100 (*)    Bacteria, UA RARE (*)    All other components within normal limits  BRAIN NATRIURETIC PEPTIDE - Abnormal; Notable for the following components:   B Natriuretic Peptide 437.0 (*)    All other components within normal limits  COMPREHENSIVE METABOLIC PANEL - Abnormal; Notable for the following components:   CO2 21 (*)    BUN 58 (*)    Creatinine, Ser 3.78 (*)    Calcium 8.8 (*)    Total Bilirubin 2.0 (*)    GFR, Estimated 14 (*)     All other components within normal limits  TROPONIN I (HIGH SENSITIVITY) - Abnormal; Notable for the following components:   Troponin I (High Sensitivity) 271 (*)    All other components within normal limits  RESP PANEL BY RT-PCR (FLU A&B, COVID) [id number]  TROPONIN I (HIGH SENSITIVITY)    EKG EKG Interpretation  Date/Time:  Monday April 12 2020 13:27:13 EST Ventricular Rate:  68 PR Interval:    QRS Duration: 100 QT Interval:  427 QTC Calculation: 455 R Axis:   -54 Text Interpretation: Sinus rhythm Abnormal R-wave progression, late transition Inferior infarct, old Lateral leads are also involved No STEMI Confirmed by Fredia Sorrow [phone]) on 04/12/2020 1:47:12 PM   Radiology DG Chest Port 1 View  Result Date: 04/12/2020 CLINICAL DATA:  Chest pain EXAM: PORTABLE CHEST 1 VIEW COMPARISON:  01/27/2019 FINDINGS: Heart size upper normal. Negative for heart failure or edema. Negative for pneumonia or effusion. Mild scarring in the bases unchanged. Bilateral rotator cuff impingement. IMPRESSION: Mild bibasilar scarring.  No superimposed acute abnormality. Electronically Signed   By: Franchot Gallo M.D.   On: 04/12/2020 14:20    Procedures Procedures (including critical care time)  CRITICAL CARE Performed by: Evalee Jefferson Total critical care time: 50 minutes Critical care time was exclusive of separately billable procedures and treating other patients. Critical care was necessary to treat or prevent imminent or life-threatening deterioration. Critical care was time spent personally by me on the following activities: development of treatment plan with patient and/or surrogate as well as nursing, discussions with consultants, evaluation of patient's response to treatment, examination of patient, obtaining history  from patient or surrogate, ordering and performing treatments and interventions, ordering and review of laboratory studies, ordering and review of radiographic studies, pulse  oximetry and re-evaluation of patient's condition.   Medications Ordered in ED Medications  nitroGLYCERIN 50 mg in dextrose 5 % 250 mL (0.2 mg/mL) infusion (40 mcg/min Intravenous Rate/Dose Change 04/12/20 1808)  heparin injection 4,000 Units (has no administration in time range)  heparin ADULT infusion 100 units/mL (25000 units/258mL) (has no administration in time range)  ondansetron (ZOFRAN) injection 4 mg (4 mg Intravenous Given 04/12/20 1502)  morphine 2 MG/ML injection 2 mg (2 mg Intravenous Given 04/12/20 1510)    ED Course  I have reviewed the triage vital signs and the nursing notes.  Pertinent labs & imaging results that were available during my care of the patient were reviewed by me and considered in my medical decision making (see chart for details).  Clinical Course as of 04/12/20 Ledora Bottcher Apr 13, 8047  [id number] 86 year old male with history of cardiac disease here with chest pain since this morning.  He says he gets this on and off for a long time.  7 out of 10 currently.  EKG does not show STEMI.  First troponin elevated at 271.  Also has new AKI.  Will need admission and discussion with cardiology.  Patient is followed both by [facility] cardiology and the [facility]. [MB]    Clinical Course User Index [MB] Hayden Rasmussen, MD   MDM Rules/Calculators/A&P                          Labs and imaging reviewed and discussed with pt.  Significant delay in obtaining troponin as first draw hemolyzed.  He was started on a nitro drip given transient improvement in SL ntg prior to arrival.  Once troponin result obtained,  Added heparin drip.  Also given morphine per IV for sx relief.  Pt is followed by Dr. Domenic Polite with cardiology, although states has also been seen cardiology at the [facility] Previous cardiac procedures here.    Pt will require admission for medical management.  Pending consult with cardiology and hospitalists.  Discussed with Tammy Triplett PA-C who will arrange admission.    Final Clinical Impression(s) / ED Diagnoses Final diagnoses:  NSTEMI (non-ST elevated myocardial infarction) [facility])  Renal insufficiency    Rx / DC Orders ED Discharge Orders    None       Landis Martins 04/13/20 1511    Evalee Jefferson, PA-C 04/13/20 1512    Fredia Sorrow, MD 04/25/20 0000

## 2020-04-12 NOTE — ED Notes (Signed)
Date and time results received: 04/12/20 1810 (use smartphrase ".now" to insert current time)  Test: troponin Critical Value: 271  Name of Provider Notified: Evalee Jefferson  Orders Received? Or Actions Taken?: n/a

## 2020-04-12 NOTE — Progress Notes (Addendum)
ANTICOAGULATION CONSULT NOTE - Initial Consult  Pharmacy Consult for IV Heparin Indication: chest pain/ACS  Allergies  Allergen Reactions  . Lisinopril Other (See Comments)    "Allergic," per the [facility]- patient is unaware of any reaction (??)    Patient Measurements: Height: 5\' 4"  (162.6 cm) Weight: 68 kg (150 lb) IBW/kg (Calculated) : 59.2 Heparin Dosing Weight:  68 kg  Vital Signs: Temp: 97.6 F (36.4 C) (12/27 1806) Temp Source: Oral (12/27 1806) BP: 131/71 (12/27 1806) Pulse Rate: 71 (12/27 1806)  Labs: Recent Labs    04/12/20 1234 04/12/20 1722  HGB 12.3*  --   HCT 39.7  --   PLT 178  --   CREATININE  --  3.78*  TROPONINIHS  --  271*    Estimated Creatinine Clearance: 9.6 mL/min (A) (by C-G formula based on SCr of 3.78 mg/dL (H)).   Medical History: Past Medical History:  Diagnosis Date  . Bronchiectasis ([place]) 06/13/2018  . CAD (coronary artery disease) 08/01/2012   BMS mid LAD; catheterization in July 2018 showed with severe heavily calcified distal circumflex-left PDA stenosis, not favorable for PCI.  Marland Kitchen Congestive heart failure (CHF) ([place])   . Duodenal ulcer May 2011   on EGD  . Essential hypertension   . GERD (gastroesophageal reflux disease)   . Helicobacter pylori gastritis 2012  . Hyperlipidemia   . ILD (interstitial lung disease) ([place])   . MI (myocardial infarction) ([place]) 1976, 1980, [place] for at [facility] and [place] (Dr. Iona Beard)  . Osteoarthritis   . Pulmonary embolism ([place])   . Pulmonary hypertension ([place])   . S/P colonoscopy 2011   normal, internal hemorrhoids    Assessment: 86 yr old man with hx of CAD and multiple MIs presented to [facility] ED with CP. Pharmacy is consulted to dose IV heparin for ACS; per med rec, pt was on not anticoagulant PTA.  H/H 12.3/39.7, platelets 178, Scr 3.78, CrCl ~10 ml/min (per Epic record, Scr was 1.60 ~ 1 yr ago)  Goal of Therapy:  Heparin level 0.3-0.7 units/ml Monitor platelets  by anticoagulation protocol: Yes   Plan:  Heparin 4000 units IV bolus X 1, followed by heparin infusion at 850 units/hr Check heparin level in 8 hrs Monitor daily heparin level, CBC Monitor for signs/symptoms of bleeding  Gillermina Hu, PharmD, BCPS, [facility] Clinical Pharmacist 04/12/2020,6:36 PM

## 2020-04-12 NOTE — H&P (Signed)
History and Physical    Anthony Galvan UOH:[id number] DOB: Sep 03, 1921 DOA: 04/12/2020  PCP: Iona Beard, MD   Patient coming from: Home.  I have personally briefly reviewed patient's old medical records in [place]  Chief Complaint: Chest pain.  HPI: Anthony Galvan is a 86 y.o. male with medical history significant of bronchiectasis, interstitial lung disease on home oxygen of 4 LPM via [place], duodenal ulcer, GERD, H. pylori gastritis, internal hemorrhoids, hyperlipidemia, CAD, chronic diastolic heart failure, pulmonary hypertension, history of pulmonary embolism who is coming to the emergency department with complaints of left-sided chest pain that felt like a pressure on his chest, radiated to his left shoulder, associated with dyspnea and nausea while he was in bed at around 0800 a while after he woke up.  He denies diaphoresis, palpitations or dizziness.  ROS is negative for PND, orthopnea, but states he gets frequent lower extremity edema.  He subsequently called his PCP at the [place] who recommended for him to take aspirin and come to the emergency department.  He also took 2 nitroglycerin tablets, which improved his symptoms briefly, but states that symptoms quickly recurred.  He denies fever, headache, rhinorrhea, sore throat, wheezing or hemoptysis.  Denies abdominal pain, emesis, diarrhea, constipation, melena or hematochezia.  No dysuria, frequency or hematuria.  No polyuria, polydipsia, polyphagia or blurred vision.  ED Course: Initial vital signs were temperature 97.8 F, pulse 69, respiration 19, blood pressure 134/69 mmHg O2 sat 100% on 4 L via nasal cannula.  The patient was started on nitroglycerin and insulin infusion in the emergency department.  Lab work: Urinalysis showed proteinuria 100 mg/dL and rare bacteria.  His CBC showed a white count of 9.8, hemoglobin 12.3 g/dL and platelets 178. CMP showed a CO2 level of 21 mmol/L.  All other electrolytes are within expected range  when calcium is corrected to albumin.  BUN was 58, and creatinine 3.78 mg/dL.  Hepatic functions show a total bilirubin of 2.0 mg/dL, all other liver tests are within normal limits.  A year ago BUN was 20 and creatinine 1.60 mg/dL. BNP was 437.0 pg/mL.  Troponin was 271 and then 248 ng/L.  EKG is sinus rhythm with abnormal R wave progression late transition.  Old inferior infarct.  Imaging: 1 view chest radiograph shows unchanged mild bibasilar scarring and bilateral rotator cuff impingement.  Please see image and full regular report for further detail.  Review of Systems: As per HPI otherwise all other systems reviewed and are negative.  Past Medical History:  Diagnosis Date  . Bronchiectasis ([place]) 06/13/2018  . CAD (coronary artery disease) 08/01/2012   BMS mid LAD; catheterization in July 2018 showed with severe heavily calcified distal circumflex-left PDA stenosis, not favorable for PCI.  Marland Kitchen Congestive heart failure (CHF) ([place])   . Duodenal ulcer May 2011   on EGD  . Essential hypertension   . GERD (gastroesophageal reflux disease)   . Helicobacter pylori gastritis 2012  . Hyperlipidemia   . ILD (interstitial lung disease) ([place])   . MI (myocardial infarction) ([place]) 1976, 1980, [place] for at [facility] (Dr. Iona Beard)  . Osteoarthritis   . Pulmonary embolism ([place])   . Pulmonary hypertension ([place])   . S/P colonoscopy 2011   normal, internal hemorrhoids   Past Surgical History:  Procedure Laterality Date  . CARDIAC CATHETERIZATION  01/30/2007   D1 75 %, mid LAD 50%, 50-70% circumflex, 50-70% RCA.(Dr. Adora Fridge)  . CARDIAC CATHETERIZATION  12/11/2001   normal L main, small RCA, dominant LL Cfx with mild diffuse disease, LAD with mid 10-20% stenosis, ramus intermedius with mild diffuse disease (Dr. Jackie Plum)  . CATARACT EXTRACTION    . COLONOSCOPY  OCT 2011 ARS   [facility]   in setting of MI  . ESOPHAGOGASTRODUODENOSCOPY N/A  10/09/2016   Procedure: ESOPHAGOGASTRODUODENOSCOPY (EGD);  Surgeon: Danie Binder, MD;  [facility];  Service: Endoscopy;  Laterality: N/A;  . FEMUR IM NAIL Right 12/01/2016   Procedure: INTRAMEDULLARY (IM) NAIL RIGHT HIP;  Surgeon: Altamese McCormick, MD;  Location: [facility];  Service: Orthopedics;  Laterality: Right;  . FEMUR IM NAIL Left 05/07/2018   Procedure: INTRAMEDULLARY (IM) NAIL FEMORAL;  Surgeon: Nicholes Stairs, MD;  Location: [facility];  Service: Orthopedics;  Laterality: Left;  . LEFT HEART CATH AND CORONARY ANGIOGRAPHY N/A 10/23/2016   Procedure: Left Heart Cath and Coronary Angiography;  Surgeon: Sherren Mocha, MD;  Location: [facility] CV LAB;  Service: Cardiovascular: Two-vessel CAD (left dominant).  Patent BMS in p LAD.  Severe, heavily calcified dCx-LPDA lesion.  Not favorable for PCI.  Marland Kitchen LEFT HEART CATHETERIZATION WITH CORONARY ANGIOGRAM N/A 08/01/2012   Procedure: LEFT HEART CATHETERIZATION WITH CORONARY ANGIOGRAM;  Surgeon: Leonie Man, MD;  Location: [facility] CATH LAB: Mid LAD 99% apple core; D2 ostial 70-80% (2 small for PCI) small nondominant RCA 60-70%. Distal circumflex/L PDA ~50% --> PCI of LAD  . NM MYOCAR PERF WALL MOTION  08/2003   adenosine stress - focal decreased perfusion defect in distal inferior wall, no significant ischemic changes  . PERCUTANEOUS STENT INTERVENTION  08/01/2012   Procedure: PERCUTANEOUS STENT INTERVENTION;  Surgeon: Leonie Man, MD;  Location[facility] C[facility]; ;PCI of mid LAD with a VeriFlex Bare Metal Stent 3.0 mm x 12 mm - post-dilated to 3.5 mm.  . TRANSTHORACIC ECHOCARDIOGRAM  10/2016   mild LVH - 60-65%. Gr 1 DD. No RWMA. Mild MR. Mod TR. Mild RA dilation.  PAP ~ 60 mmHg.  Marland Kitchen UPPER GASTROINTESTINAL ENDOSCOPY  08/2009   MELENA, HEMATEMESIS --> DUODENAL ULCER, Bx: H PYLORI POS   Social History  reports that he has never smoked. He has never used smokeless tobacco. He reports that he does not drink alcohol and does not use  drugs.  Allergies  Allergen Reactions  . Lisinopril Other (See Comments)    "Allergic," per the [facility]- patient is unaware of any reaction (??)   Family History  Problem Relation Age of Onset  . Heart disease Mother   . Kidney disease Father   . Heart disease Brother 32  . Heart disease Sister   . Colon cancer Neg Hx    Prior to Admission medications   Medication Sig Start Date End Date Taking? Authorizing Provider  acetaminophen (TYLENOL) 325 MG tablet Take 2 tablets (650 mg total) by mouth 3 (three) times daily as needed (pain). 12/04/16  Yes Ainsley Spinner, PA-C  alum & mag hydroxide-simeth (MAALOX/MYLANTA) 200-200-20 MG/5ML suspension Take 30 mLs by mouth every 4 (four) hours as needed for indigestion. 01/29/19   Johnson, Clanford L, MD  aspirin EC 81 MG tablet Take 1 tablet (81 mg total) by mouth daily. Swallow whole. 10/01/19   Strader, Fransisco Hertz, PA-C  atorvastatin (LIPITOR) 80 MG tablet Take 1 tablet (80 mg total) by mouth daily at 6 PM. 08/02/12   Brett Canales, PA-C  calcium carbonate (OS-CAL - DOSED IN MG OF  ELEMENTAL CALCIUM) 1250 (500 Ca) MG tablet Take 1 tablet (500 mg of elemental calcium total) by mouth daily with breakfast. 05/13/18   Mullis, Kiersten P, DO  furosemide (LASIX) 40 MG tablet Take 1.5 tablets (60 mg total) by mouth daily. 10/01/19   Strader, Fransisco Hertz, PA-C  guaiFENesin (MUCINEX) 600 MG 12 hr tablet Take 1 tablet (600 mg total) by mouth 2 (two) times daily. 06/15/18   Kathie Dike, MD  ipratropium-albuterol (DUONEB) 0.5-2.5 (3) MG/3ML SOLN Take 3 mLs by nebulization 3 (three) times daily. 01/29/19   Johnson, Clanford L, MD  isosorbide dinitrate (ISORDIL) 20 MG tablet Take 1 tablet (20 mg total) by mouth 3 (three) times daily. Patient taking differently: Take 60 mg by mouth 3 (three) times daily.  01/29/19 05/29/19  Johnson, Clanford L, MD  metoprolol tartrate (LOPRESSOR) 25 MG tablet Take 0.5 tablets (12.5 mg total) by mouth 2 (two) times daily. 05/13/18   Mullis,  Kiersten P, DO  Multiple Vitamin (MULTIVITAMIN WITH MINERALS) TABS tablet Take 1 tablet by mouth daily.    [provider]  NITROSTAT 0.4 MG SL tablet PLACE 1 TABLET UNDER TONGUE EVERY 5 MINUTES FOR 3 DOSES AS NEEDED. 11/17/13   Leonie Man, MD  pantoprazole (PROTONIX) 40 MG tablet Take 1 tablet (40 mg total) by mouth daily. 01/30/19   Johnson, Clanford L, MD  polyethylene glycol (MIRALAX / GLYCOLAX) packet Take 17 g by mouth 2 (two) times daily. Patient taking differently: Take 17 g by mouth as needed.  05/13/18   Mullis, Kiersten P, DO  Vitamin D, Ergocalciferol, (DRISDOL) 1.25 MG (50000 UT) CAPS capsule Take 1 capsule (50,000 Units total) by mouth every 7 (seven) days. 05/16/18   Danna Hefty, DO   Physical Exam: Vitals:   04/12/20 1900 04/12/20 1930 04/12/20 2000 04/12/20 2030  BP: 124/70 125/67 119/66 126/62  Pulse:  69 73 73  Resp: _0 Temp:      TempSrc:      SpO2:  100% 99% 100%  Weight:      Height:       Constitutional: Frail, elderly male. Eyes: PERRL, lids and conjunctivae normal ENMT: Mucous membranes are moist. Posterior pharynx clear of any exudate or lesions. Neck: normal, supple, no masses, no thyromegaly Respiratory: clear to auscultation bilaterally, no wheezing, no crackles. Normal respiratory effort. No accessory muscle use.  Cardiovascular: Regular rate and rhythm, 1/6 systolic murmur, rubs / gallops.  1+ lower bilateral extremity pitting edema. 2+ pedal pulses. No carotid bruits.  Abdomen: No distention.  Bowel sounds positive.  Soft, no tenderness, no masses palpated. No hepatosplenomegaly. Musculoskeletal: no clubbing / cyanosis. Good ROM, no contractures. Normal muscle tone.  Skin: no rashes, lesions, ulcers on very limited dermatological examination. Neurologic: CN 2-12 grossly intact. Sensation intact, DTR normal. Strength 5/5 in all 4.  Psychiatric: Normal judgment and insight. Alert and oriented x 3. Normal mood.   Labs on  Admission: I have personally reviewed following labs and imaging studies  CBC: Recent Labs  Lab 04/12/20 1234  WBC 9.8  NEUTROABS 6.2  HGB 12.3*  HCT 39.7  MCV 96.1  PLT 396   Basic Metabolic Panel: Recent Labs  Lab 04/12/20 1722  NA 136  K 4.1  CL 101  CO2 21*  GLUCOSE 83  BUN 58*  CREATININE 3.78*  CALCIUM 8.8*   GFR: Estimated Creatinine Clearance: 9.6 mL/min (A) (by C-G formula based on SCr of 3.78 mg/dL (H)).  Liver Function Tests: Recent  Labs  Lab 04/12/20 1722  AST 39  ALT 23  ALKPHOS 67  BILITOT 2.0*  PROT 6.8  ALBUMIN 3.5   Urine analysis:    Component Value Date/Time   COLORURINE AMBER (A) 04/12/2020 1404   APPEARANCEUR HAZY (A) 04/12/2020 1404        LABSPEC 1.016 04/12/2020 1404   PHURINE 5.0 04/12/2020 1404   GLUCOSEU NEGATIVE 04/12/2020 1404   HGBUR NEGATIVE 04/12/2020 1404   BILIRUBINUR NEGATIVE 04/12/2020 1404        KETONESUR NEGATIVE 04/12/2020 1404   PROTEINUR 100 (A) 04/12/2020 1404        NITRITE NEGATIVE 04/12/2020 1404   LEUKOCYTESUR NEGATIVE 04/12/2020 1404   Radiological Exams on Admission: DG Chest Port 1 View  Result Date: 04/12/2020 CLINICAL DATA:  Chest pain EXAM: PORTABLE CHEST 1 VIEW COMPARISON:  01/27/2019 FINDINGS: Heart size upper normal. Negative for heart failure or edema. Negative for pneumonia or effusion. Mild scarring in the bases unchanged. Bilateral rotator cuff impingement. IMPRESSION: Mild bibasilar scarring.  No superimposed acute abnormality. Electronically Signed   By: Franchot Gallo M.D.   On: 04/12/2020 14:20   01/25/2019 echocardiogram   IMPRESSIONS:  1. Left ventricular ejection fraction, by visual estimation, is 65 to  70%. The left ventricle has normal function. Normal left ventricular size.  There is no left ventricular hypertrophy.  2. Elevated left ventricular end-diastolic pressure.  3. Left ventricular diastolic Doppler parameters are consistent with  impaired relaxation pattern of LV  diastolic filling.  4. Global right ventricle has normal systolic function.The right  ventricular size is moderately enlarged. No increase in right ventricular  wall thickness.  5. Left atrial size was normal.  6. Right atrial size was normal.  7. The mitral valve is normal in structure. No evidence of mitral valve  regurgitation. No evidence of mitral stenosis.  8. The tricuspid valve is normal in structure. Tricuspid valve  regurgitation moderate-severe.  9. The aortic valve is normal in structure. Aortic valve regurgitation is  mild by color flow Doppler. Mild to moderate aortic valve  sclerosis/calcification without any evidence of aortic stenosis.  10. The pulmonic valve was normal in structure. Pulmonic valve  regurgitation is not visualized by color flow Doppler.  11. Moderately elevated pulmonary artery systolic pressure.  12. The tricuspid regurgitant velocity is 3.29 m/s, and with an assumed  right atrial pressure of 10 mmHg, the estimated right ventricular systolic  pressure is moderately elevated at 53.3 mmHg.  13. The inferior vena cava is normal in size with greater than 50%  respiratory variability, suggesting right atrial pressure of 3 mmHg.   EKG: Independently reviewed. Vent. rate 68 BPM PR interval * ms QRS duration 100 ms QT/QTc 427/455 ms P-R-T axes 52 -54 90 Sinus rhythm Abnormal R-wave progression, late transition Inferior infarct, old Lateral leads are also involved  Assessment/Plan Principal Problem:   Acute coronary syndrome with high troponin (HCC)   CAD S/P percutaneous coronary angioplasty Observation/stepdown. Supplemental oxygen as needed. Continue heparin infusion. Continue NTG infusion. Aspirin 81 mg p.o. daily. Metoprolol 12.5 mg p.o. twice daily. Hold atorvastatin (pending med rec and hyperbilirubinemia) Obtain echocardiogram. Consult cardiology in a.m.  Active Problems:   AKI vs progression to stage V CKD Creatinine was 1.60  mg/dL last December. GFR 36 mL/min was a year ago. Hold furosemide pending repeat labs in a.m. Will try to obtain 2021's lab results from PCP.    Hyperlipidemia Not sure if on statin. Needs met reconciliation.  Essential hypertension Metoprolol 12.5 mg p.o. twice daily. Monitor blood pressure and heart rate.    GERD   PUD (peptic ulcer disease) Protonix 40 mg p.o. daily.    Normocytic anemia Normal V74 and folic acid last year. Monitor hematocrit and hemoglobin. Follow-up with PCP.    Hyperbilirubinemia Repeat hepatic function in AM. Atorvastatin held pending med rec and these lab results.    DVT prophylaxis: On heparin infusion. Code Status:   Full code. Family Communication: Disposition Plan:   Patient is from:  Home.  Anticipated DC to:  Home.  Anticipated DC date:  04/14/2020.  Anticipated DC barriers: Clinical status.  Consults called:  Routine cardiology counseling a.m. Admission status:  Observation/stepdown.   Severity of Illness: High due to ACS with elevated troponin level in the patient with history of CAD and other significant risk factors for cardiovascular disease.  Reubin Milan MD [facility]  How to contact the [facility] Attending or Consulting provider [place] or covering provider during after hours [place], for this patient?   1. Check the care team in [facility] and look for a) attending/consulting [facility] provider listed and b) the [facility] team listed 2. Log into [email or web address] and use [place]'s universal password to access. If you do not have the password, please contact the hospital operator. 3. Locate the [facility] provider you are looking for under [facility] and page to a number that you can be directly reached. 4. If you still have difficulty reaching the provider, please page the [facility] (Director on Call) for the Hospitalists listed on amion for assistance.  04/12/2020, 8:53 PM   This document was prepared using Dragon voice recognition  software and may contain some unintended transcription errors.

## 2020-04-13 ENCOUNTER — Observation Stay (HOSPITAL_COMMUNITY): Payer: Medicare Other

## 2020-04-13 DIAGNOSIS — Z79899 Other long term (current) drug therapy: Secondary | ICD-10-CM | POA: Diagnosis not present

## 2020-04-13 DIAGNOSIS — I214 Non-ST elevation (NSTEMI) myocardial infarction: Secondary | ICD-10-CM | POA: Diagnosis not present

## 2020-04-13 DIAGNOSIS — Z86711 Personal history of pulmonary embolism: Secondary | ICD-10-CM | POA: Diagnosis not present

## 2020-04-13 DIAGNOSIS — J90 Pleural effusion, not elsewhere classified: Secondary | ICD-10-CM | POA: Diagnosis not present

## 2020-04-13 DIAGNOSIS — Z20822 Contact with and (suspected) exposure to covid-19: Secondary | ICD-10-CM | POA: Diagnosis present

## 2020-04-13 DIAGNOSIS — N185 Chronic kidney disease, stage 5: Secondary | ICD-10-CM | POA: Diagnosis present

## 2020-04-13 DIAGNOSIS — N179 Acute kidney failure, unspecified: Secondary | ICD-10-CM | POA: Diagnosis present

## 2020-04-13 DIAGNOSIS — D6489 Other specified anemias: Secondary | ICD-10-CM | POA: Diagnosis present

## 2020-04-13 DIAGNOSIS — I5032 Chronic diastolic (congestive) heart failure: Secondary | ICD-10-CM | POA: Diagnosis not present

## 2020-04-13 DIAGNOSIS — J9611 Chronic respiratory failure with hypoxia: Secondary | ICD-10-CM | POA: Diagnosis present

## 2020-04-13 DIAGNOSIS — I5033 Acute on chronic diastolic (congestive) heart failure: Secondary | ICD-10-CM | POA: Diagnosis present

## 2020-04-13 DIAGNOSIS — S2231XA Fracture of one rib, right side, initial encounter for closed fracture: Secondary | ICD-10-CM | POA: Diagnosis not present

## 2020-04-13 DIAGNOSIS — R17 Unspecified jaundice: Secondary | ICD-10-CM | POA: Diagnosis present

## 2020-04-13 DIAGNOSIS — Z7982 Long term (current) use of aspirin: Secondary | ICD-10-CM | POA: Diagnosis not present

## 2020-04-13 DIAGNOSIS — I249 Acute ischemic heart disease, unspecified: Secondary | ICD-10-CM

## 2020-04-13 DIAGNOSIS — Z955 Presence of coronary angioplasty implant and graft: Secondary | ICD-10-CM | POA: Diagnosis not present

## 2020-04-13 DIAGNOSIS — N289 Disorder of kidney and ureter, unspecified: Secondary | ICD-10-CM | POA: Diagnosis present

## 2020-04-13 DIAGNOSIS — I361 Nonrheumatic tricuspid (valve) insufficiency: Secondary | ICD-10-CM | POA: Diagnosis not present

## 2020-04-13 DIAGNOSIS — E785 Hyperlipidemia, unspecified: Secondary | ICD-10-CM | POA: Diagnosis present

## 2020-04-13 DIAGNOSIS — I272 Pulmonary hypertension, unspecified: Secondary | ICD-10-CM | POA: Diagnosis present

## 2020-04-13 DIAGNOSIS — K219 Gastro-esophageal reflux disease without esophagitis: Secondary | ICD-10-CM | POA: Diagnosis present

## 2020-04-13 DIAGNOSIS — I2510 Atherosclerotic heart disease of native coronary artery without angina pectoris: Secondary | ICD-10-CM | POA: Diagnosis present

## 2020-04-13 DIAGNOSIS — I252 Old myocardial infarction: Secondary | ICD-10-CM | POA: Diagnosis not present

## 2020-04-13 DIAGNOSIS — Z993 Dependence on wheelchair: Secondary | ICD-10-CM | POA: Diagnosis not present

## 2020-04-13 DIAGNOSIS — N183 Chronic kidney disease, stage 3 unspecified: Secondary | ICD-10-CM | POA: Diagnosis not present

## 2020-04-13 DIAGNOSIS — I071 Rheumatic tricuspid insufficiency: Secondary | ICD-10-CM | POA: Diagnosis present

## 2020-04-13 DIAGNOSIS — I132 Hypertensive heart and chronic kidney disease with heart failure and with stage 5 chronic kidney disease, or end stage renal disease: Secondary | ICD-10-CM | POA: Diagnosis not present

## 2020-04-13 DIAGNOSIS — J849 Interstitial pulmonary disease, unspecified: Secondary | ICD-10-CM | POA: Diagnosis present

## 2020-04-13 LAB — BASIC METABOLIC PANEL
Anion gap: 12 (ref 5–15)
BUN: 54 mg/dL — ABNORMAL HIGH (ref 8–23)
CO2: 22 mmol/L (ref 22–32)
Calcium: 8.5 mg/dL — ABNORMAL LOW (ref 8.9–10.3)
Chloride: 103 mmol/L (ref 98–111)
Creatinine, Ser: 3.34 mg/dL — ABNORMAL HIGH (ref 0.61–1.24)
GFR, Estimated: 16 mL/min — ABNORMAL LOW (ref >60)
Glucose, Bld: 66 mg/dL — ABNORMAL LOW (ref 70–99)
Potassium: 4.3 mmol/L (ref 3.5–5.1)
Sodium: 137 mmol/L (ref 135–145)

## 2020-04-13 LAB — HEPARIN LEVEL (UNFRACTIONATED)
Heparin Unfractionated: 0.81 IU/mL — ABNORMAL HIGH (ref 0.30–0.70)
Heparin Unfractionated: 0.44 IU/mL (ref 0.30–0.70)
Heparin Unfractionated: 0.42 IU/mL (ref 0.30–0.70)

## 2020-04-13 LAB — ECHOCARDIOGRAM COMPLETE
Area-P 1/2: 3.4 cm2
Height: 64 in
S' Lateral: 2.4 cm
Weight: 2400 oz

## 2020-04-13 LAB — CBC
HCT: 36.6 % — ABNORMAL LOW (ref 39.0–52.0)
Hemoglobin: 11 g/dL — ABNORMAL LOW (ref 13.0–17.0)
MCH: 29.7 pg (ref 26.0–34.0)
MCHC: 30.1 g/dL (ref 30.0–36.0)
MCV: 98.9 fL (ref 80.0–100.0)
Platelets: 126 10*3/uL — ABNORMAL LOW (ref 150–400)
RBC: 3.7 MIL/uL — ABNORMAL LOW (ref 4.22–5.81)
RDW: 16.5 % — ABNORMAL HIGH (ref 11.5–15.5)
WBC: 8.2 10*3/uL (ref 4.0–10.5)
nRBC: 0 % (ref 0.0–0.2)

## 2020-04-13 LAB — TROPONIN I (HIGH SENSITIVITY): Troponin I (High Sensitivity): 238 ng/L (ref <18)

## 2020-04-13 MED ORDER — ASPIRIN EC 81 MG PO TBEC
81.0000 mg | DELAYED_RELEASE_TABLET | Freq: Every day | ORAL | Status: DC
  Administered 2020-04-13 – 2020-04-19 (×7): 81 mg via ORAL
  Filled 2020-04-13 (×7): qty 1

## 2020-04-13 MED ORDER — ATORVASTATIN CALCIUM 80 MG PO TABS
80.0000 mg | ORAL_TABLET | Freq: Every day | ORAL | Status: DC
  Administered 2020-04-13 – 2020-04-19 (×7): 80 mg via ORAL
  Filled 2020-04-13 (×3): qty 1
  Filled 2020-04-13: qty 2
  Filled 2020-04-13 (×3): qty 1

## 2020-04-13 MED ORDER — PANTOPRAZOLE SODIUM 40 MG PO TBEC
40.0000 mg | DELAYED_RELEASE_TABLET | Freq: Every day | ORAL | Status: DC
  Administered 2020-04-13 – 2020-04-15 (×3): 40 mg via ORAL
  Filled 2020-04-13 (×3): qty 1

## 2020-04-13 MED ORDER — METOPROLOL TARTRATE 12.5 MG HALF TABLET
12.5000 mg | ORAL_TABLET | Freq: Two times a day (BID) | ORAL | Status: DC
  Administered 2020-04-13 – 2020-04-19 (×13): 12.5 mg via ORAL
  Filled 2020-04-13 (×13): qty 1

## 2020-04-13 NOTE — Progress Notes (Signed)
*  PRELIMINARY RESULTS* Echocardiogram 2D Echocardiogram has been performed.  Anthony Galvan 04/13/2020, 2:03 PM

## 2020-04-13 NOTE — Consult Note (Addendum)
Cardiology Consult    Patient ID: DAYLIN GRUSZKA; [id number]; [birth year]   Admit date: 04/12/2020 Date of Consult: 04/13/2020  Primary Care Provider: Iona Beard, MD Primary Cardiologist: Rozann Lesches, MD   Patient Profile    MOHAMEDAMIN NIFONG is a 86 y.o. male with past medical history of CAD (s/p BMS to LAD in 2014, repeat cath in 10/2016 showing patent stent and 80% distal LCx stenosis not suitable for PCI and medical management recommended), ILD (on 3-4L [place] at baseline), chronic diastolic CHF, pulmonary HTN, moderate to severe TR, acute PE (diagnosed in 05/2018 - no longer on anticoagulation), HTN, HLD, GERD and Stage 3 CKD  who is being seen today for the evaluation of NSTEMI at the request of Dr. Olevia Bowens.   History of Present Illness    Mr. Police was last examined in the office by myself in 09/2019 and was mostly wheelchair-bound at that time. He did report lower extremity edema as he did not elevate his legs frequently. He denied any chest pain or dyspnea at that time. He was continued on ASA 81 mg daily, Isordil 20 mg TID, Lopressor 12.5mg  BID, Atorvastatin 80mg  daily and Lasix was titrated from 40mg  daily to 60mg  daily given his edema. Repeat BMET was ordered but it does not appear this was obtained.    He presented to [facility] ED on 04/12/2020 for evaluation of chest pain and subjective chills. In talking with the patient this morning, he says he was evaluated by his PCP yesterday and was told his "heart was not acting right" (by review of notes they had called his PCP but he was not evaluated). Says he felt more short of breath but does not recall any chest pain. Appears by review of the EDP note, he presented with chest pressure which started at 0800 and this improved with SL NTG but represented. He does not recall this today. He does have baseline dyspnea on exertion and is on 4L [place] at baseline. No specific orthopnea or PND. He does have chronic lower extremity edema. Unsure of  any recent dose changes to his diuretic since his last visit in 09/2019 (followed by the [place] as well).   Initial labs show WBC 9.8, Hgb 12.3, platelets 178, Na+ 136, K+ 4.1 and creatinine 3.78 (previously 1.44 in 06/2019). BNP 437. AST 39 and ALT 23. Total Bilirubin elevated to 2.0. Initial HS Troponin 271 with repeat of 248. CXR shows mild bibasilar scarring with no acute findings. EKG shows NSR, HR 68 with inferior infarct pattern but no acute ST changes when compared to prior tracings.   He was started on IV Heparin and IV NTG at the time of admission and denies any current pain at this time.   Past Medical History:  Diagnosis Date  . Bronchiectasis ([employer]) 06/13/2018  . CAD (coronary artery disease) 08/01/2012   BMS mid LAD; catheterization in July 2018 showed with severe heavily calcified distal circumflex-left PDA stenosis, not favorable for PCI.  Marland Kitchen Congestive heart failure (CHF) ([place])   . Duodenal ulcer May 2011   on EGD  . Essential hypertension   . GERD (gastroesophageal reflux disease)   . Helicobacter pylori gastritis 2012  . Hyperlipidemia   . ILD (interstitial lung disease) ([place])   . MI (myocardial infarction) ([place]) 1976, 1980, [place] for at [facility] [place] (Dr. Iona Beard)  . Osteoarthritis   . Pulmonary embolism ([place])   . Pulmonary hypertension ([place])   .  S/P colonoscopy 2011   normal, internal hemorrhoids    Past Surgical History:  Procedure Laterality Date  . CARDIAC CATHETERIZATION  01/30/2007   D1 75 %, mid LAD 50%, 50-70% circumflex, 50-70% RCA.(Dr. Adora Fridge)  . CARDIAC CATHETERIZATION  12/11/2001   normal L main, small RCA, dominant LL Cfx with mild diffuse disease, LAD with mid 10-20% stenosis, ramus intermedius with mild diffuse disease (Dr. Jackie Plum)  . CATARACT EXTRACTION    . COLONOSCOPY  OCT 2011 ARS   [facility] [place]   in setting of MI  . ESOPHAGOGASTRODUODENOSCOPY N/A 10/09/2016   Procedure:  ESOPHAGOGASTRODUODENOSCOPY (EGD);  Surgeon: Danie Binder, MD;  [facility];  Service: Endoscopy;  Laterality: N/A;  . FEMUR IM NAIL Right 12/01/2016   Procedure: INTRAMEDULLARY (IM) NAIL RIGHT HIP;  Surgeon: Altamese Montmorenci, MD;  Location: [place];  Service: Orthopedics;  Laterality: Right;  . FEMUR IM NAIL Left 05/07/2018   Procedure: INTRAMEDULLARY (IM) NAIL FEMORAL;  Surgeon: Nicholes Stairs, MD;  Location: [place];  Service: Orthopedics;  Laterality: Left;  . LEFT HEART CATH AND CORONARY ANGIOGRAPHY N/A 10/23/2016   Procedure: Left Heart Cath and Coronary Angiography;  Surgeon: Sherren Mocha, MD;  Location: [place] CV LAB;  Service: Cardiovascular: Two-vessel CAD (left dominant).  Patent BMS in p LAD.  Severe, heavily calcified dCx-LPDA lesion.  Not favorable for PCI.  Marland Kitchen LEFT HEART CATHETERIZATION WITH CORONARY ANGIOGRAM N/A 08/01/2012   Procedure: LEFT HEART CATHETERIZATION WITH CORONARY ANGIOGRAM;  Surgeon: Leonie Man, MD;  Location: [facility] CATH LAB: Mid LAD 99% apple core; D2 ostial 70-80% (2 small for PCI) small nondominant RCA 60-70%. Distal circumflex/L PDA ~50% --> PCI of LAD  . NM MYOCAR PERF WALL MOTION  08/2003   adenosine stress - focal decreased perfusion defect in distal inferior wall, no significant ischemic changes  . PERCUTANEOUS STENT INTERVENTION  08/01/2012   Procedure: PERCUTANEOUS STENT INTERVENTION;  Surgeon: Leonie Man, MD;  Location: [facility] CATH LAB; ;PCI of mid LAD with a VeriFlex Bare Metal Stent 3.0 mm x 12 mm - post-dilated to 3.5 mm.  . TRANSTHORACIC ECHOCARDIOGRAM  10/2016   mild LVH - 60-65%. Gr 1 DD. No RWMA. Mild MR. Mod TR. Mild RA dilation.  PAP ~ 60 mmHg.  Marland Kitchen UPPER GASTROINTESTINAL ENDOSCOPY  08/2009   MELENA, HEMATEMESIS --> DUODENAL ULCER, Bx: H PYLORI POS     Home Medications:  Prior to Admission medications   Medication Sig Start Date End Date Taking? Authorizing Provider  acetaminophen (TYLENOL) 325 MG tablet Take 2 tablets (650 mg  total) by mouth 3 (three) times daily as needed (pain). 12/04/16  Yes Ainsley Spinner, PA-C  alum & mag hydroxide-simeth (MAALOX/MYLANTA) 200-200-20 MG/5ML suspension Take 30 mLs by mouth every 4 (four) hours as needed for indigestion. 01/29/19   Johnson, Clanford L, MD  aspirin EC 81 MG tablet Take 1 tablet (81 mg total) by mouth daily. Swallow whole. 10/01/19   Strader, Fransisco Hertz, PA-C  atorvastatin (LIPITOR) 80 MG tablet Take 1 tablet (80 mg total) by mouth daily at 6 PM. 08/02/12   Brett Canales, PA-C  calcium carbonate (OS-CAL - DOSED IN MG OF ELEMENTAL CALCIUM) 1250 (500 Ca) MG tablet Take 1 tablet (500 mg of elemental calcium total) by mouth daily with breakfast. 05/13/18   Mullis, Kiersten P, DO  furosemide (LASIX) 40 MG tablet Take 1.5 tablets (60 mg total) by mouth daily. 10/01/19   Ahmed Prima, [place]  M, PA-C  guaiFENesin (MUCINEX) 600 MG 12 hr tablet Take 1 tablet (600 mg total) by mouth 2 (two) times daily. 06/15/18   Kathie Dike, MD  ipratropium-albuterol (DUONEB) 0.5-2.5 (3) MG/3ML SOLN Take 3 mLs by nebulization 3 (three) times daily. 01/29/19   Johnson, Clanford L, MD  isosorbide dinitrate (ISORDIL) 20 MG tablet Take 1 tablet (20 mg total) by mouth 3 (three) times daily. Patient taking differently: Take 60 mg by mouth 3 (three) times daily.  01/29/19 05/29/19  Johnson, Clanford L, MD  metoprolol tartrate (LOPRESSOR) 25 MG tablet Take 0.5 tablets (12.5 mg total) by mouth 2 (two) times daily. 05/13/18   Mullis, Kiersten P, DO  Multiple Vitamin (MULTIVITAMIN WITH MINERALS) TABS tablet Take 1 tablet by mouth daily.    [provider]  NITROSTAT 0.4 MG SL tablet PLACE 1 TABLET UNDER TONGUE EVERY 5 MINUTES FOR 3 DOSES AS NEEDED. 11/17/13   Leonie Man, MD  pantoprazole (PROTONIX) 40 MG tablet Take 1 tablet (40 mg total) by mouth daily. 01/30/19   Johnson, Clanford L, MD  polyethylene glycol (MIRALAX / GLYCOLAX) packet Take 17 g by mouth 2 (two) times daily. Patient taking differently:  Take 17 g by mouth as needed.  05/13/18   Mullis, Kiersten P, DO  Vitamin D, Ergocalciferol, (DRISDOL) 1.25 MG (50000 UT) CAPS capsule Take 1 capsule (50,000 Units total) by mouth every 7 (seven) days. 05/16/18   Mullis, Archie Endo, DO    Inpatient Medications: Scheduled Meds: . aspirin EC  81 mg Oral Daily  . atorvastatin  80 mg Oral Daily  . metoprolol tartrate  12.5 mg Oral BID  . pantoprazole  40 mg Oral Daily   Continuous Infusions: . heparin 750 Units/hr (04/13/20 0420)  . nitroGLYCERIN 40 mcg/min (04/13/20 0658)   PRN Meds: acetaminophen **OR** acetaminophen, ondansetron **OR** ondansetron (ZOFRAN) IV  Allergies:    Allergies  Allergen Reactions  . Lisinopril Other (See Comments)    "Allergic," per the [facility]- patient is unaware of any reaction (??)    Social History:   Social History   Socioeconomic History  . Marital status: Widowed    Spouse name: Not on file  . Number of children: 3  . Years of education: Not on file  . Highest education level: Not on file  Occupational History  . Not on file  Tobacco Use  . Smoking status: Never Smoker  . Smokeless tobacco: Never Used  Vaping Use  . Vaping Use: Never used  Substance and Sexual Activity  . Alcohol use: No  . Drug use: No  . Sexual activity: Not on file  Other Topics Concern  . Not on file  Social History Narrative   Is a father of 3 with 3 grandchildren 3 great-grandchildren. He lives with one of his 2 grandsons.   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 He was a war 2 veteran in the [place]. 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   He never smoked. He does not drink alcohol.   He is very active, and a majority. He walks 30+ minutes every day. He also likes to play golf.   Social Determinants of Health   Financial Resource Strain: Not on file  Food Insecurity: Not on file   Transportation Needs: Not on file  Physical Activity: Not on file  Stress: Not  on file  Social Connections: Not on file  Intimate Partner Violence: Not on file     Family History:    Family History  Problem Relation Age of Onset  . Heart disease Mother   . Kidney disease Father   . Heart disease Brother 59  . Heart disease Sister   . Colon cancer Neg Hx       Review of Systems    General:  No chills, fever, night sweats or weight changes.  Cardiovascular:  No orthopnea, palpitations, paroxysmal nocturnal dyspnea. Positive for chest pain, dyspnea on exertion and edema.  Dermatological: No rash, lesions/masses Respiratory: No cough, Positive for dyspnea. Urologic: No hematuria, dysuria Abdominal:   No nausea, vomiting, diarrhea, bright red blood per rectum, melena, or hematemesis Neurologic:  No visual changes, wkns, changes in mental status. All other systems reviewed and are otherwise negative except as noted above.  Physical Exam/Data    Vitals:   04/13/20 0500 04/13/20 0530 04/13/20 0600 04/13/20 0630  BP: (!) 118/55 131/65 129/68 114/66  Pulse: 66 (!) 155 69 70  Resp: (!) 21 (!) 26 16 15   Temp:      TempSrc:      SpO2: 97% 100% 100% 100%  Weight:      Height:       No intake or output data in the 24 hours ending 04/13/20 0844 Filed Weights   04/12/20 1324  Weight: 68 kg   Body mass index is 25.75 kg/m.   General: Pleasant, elderly male appearing in NAD Psych: Normal affect. Neuro: Alert and oriented X 2 (person, place). Moves all extremities spontaneously. HEENT: Normal  Neck: Supple without bruits or JVD. Lungs:  Resp regular and unlabored, decreased breath sounds along bases. Heart: RRR no s3, s4, 2/6 SEM along RUSB.  Abdomen: Soft, non-tender, non-distended, BS + x 4.  Extremities: No clubbing or cyanosis. 1+ pitting edema bilaterally. DP/PT/Radials 2+ and equal bilaterally.   EKG:  The EKG was personally reviewed and demonstrates: NSR, HR 68 with  inferior infarct pattern but no acute ST changes when compared to prior tracings.   Telemetry:  Telemetry was personally reviewed and demonstrates: NSR, HR in 70's to 80's with occasional PVC's.    Labs/Studies     Relevant CV Studies:  Cardiac Catheterization: 10/2017       1. Two-vessel coronary artery disease (left dominant) with patency of the stented segment in the proximal LAD and severe heavily calcified distal circumflex/left PDA stenosis unfavorable for PCI   2. Known normal LV function by noninvasive assessment   3. Normal LVEDP   Recommend: Medical therapy for CAD. The distal circumflex/PDA lesion is severely calcified with a distal vessel location that is unfavorable for PCI and I would be very hesitant to use atherectomy in this distal location.   Echocardiogram: 01/2019 IMPRESSIONS     1. Left ventricular ejection fraction, by visual estimation, is 65 to  70%. The left ventricle has normal function. Normal left ventricular size.  There is no left ventricular hypertrophy.   2. Elevated left ventricular end-diastolic pressure.   3. Left ventricular diastolic Doppler parameters are consistent with  impaired relaxation pattern of LV diastolic filling.   4. Global right ventricle has normal systolic function.The right  ventricular size is moderately enlarged. No increase in right ventricular  wall thickness.   5. Left atrial size was normal.   6. Right atrial size was normal.   7. The mitral valve is normal in structure. No evidence  of mitral valve  regurgitation. No evidence of mitral stenosis.   8. The tricuspid valve is normal in structure. Tricuspid valve  regurgitation moderate-severe.   9. The aortic valve is normal in structure. Aortic valve regurgitation is  mild by color flow Doppler. Mild to moderate aortic valve  sclerosis/calcification without any evidence of aortic stenosis.  10. The pulmonic valve was normal in structure. Pulmonic valve   regurgitation is not visualized by color flow Doppler.  11. Moderately elevated pulmonary artery systolic pressure.  12. The tricuspid regurgitant velocity is 3.29 m/s, and with an assumed  right atrial pressure of 10 mmHg, the estimated right ventricular systolic  pressure is moderately elevated at 53.3 mmHg.  13. The inferior vena cava is normal in size with greater than 50%  respiratory variability, suggesting right atrial pressure of 3 mmHg.    Laboratory Data:  Chemistry Recent Labs  Lab 04/12/20 1722  NA 136  K 4.1  CL 101  CO2 21*  GLUCOSE 83  BUN 58*  CREATININE 3.78*  CALCIUM 8.8*  GFRNONAA 14*  ANIONGAP 14    Recent Labs  Lab 04/12/20 1722  PROT 6.8  ALBUMIN 3.5  AST 39  ALT 23  ALKPHOS 67  BILITOT 2.0*   Hematology Recent Labs  Lab 04/12/20 1234 04/13/20 0311  WBC 9.8 8.2  RBC 4.13* 3.70*  HGB 12.3* 11.0*  HCT 39.7 36.6*  MCV 96.1 98.9  MCH 29.8 29.7  MCHC 31.0 30.1  RDW 16.4* 16.5*  PLT 178 126*   Cardiac EnzymesNo results for input(s): TROPONINI in the last 168 hours. No results for input(s): TROPIPOC in the last 168 hours.  BNP Recent Labs  Lab 04/12/20 1722  BNP 437.0*    DDimer No results for input(s): DDIMER in the last 168 hours.  Radiology/Studies:  DG Chest 2 View  Result Date: 04/13/2020 CLINICAL DATA:  Right rib fracture, pneumothorax EXAM: CHEST - 2 VIEW COMPARISON:  04/12/2020 FINDINGS: Lung volumes are small with asymmetric right-sided volume loss, unchanged from prior examination. No superimposed confluent pulmonary infiltrate. Mild bibasilar scarring. No pneumothorax or pleural effusion. Cardiac size is mildly enlarged, unchanged. Pulmonary vascularity is normal. No acute bone abnormality. Degenerative changes are noted within the shoulders. IMPRESSION: Stable pulmonary hypoinflation.  No pneumothorax identified. Electronically Signed   By: Fidela Salisbury MD   On: 04/13/2020 05:26   DG Chest Port 1 View  Result Date:  04/12/2020 CLINICAL DATA:  Chest pain EXAM: PORTABLE CHEST 1 VIEW COMPARISON:  01/27/2019 FINDINGS: Heart size upper normal. Negative for heart failure or edema. Negative for pneumonia or effusion. Mild scarring in the bases unchanged. Bilateral rotator cuff impingement. IMPRESSION: Mild bibasilar scarring.  No superimposed acute abnormality. Electronically Signed   By: Franchot Gallo M.D.   On: 04/12/2020 14:20     Assessment & Plan    1. NSTEMI - The patient does not recall specific events from yesterday but by review of notes he presented with chest pain which had started at rest yesterday morning and improved with SL NTG. He denies any recurrent pain at this time.  - Initial HS Troponin found to be elevated to 271 with repeat of 248. EKG shows NSR, HR 68 with inferior infarct pattern but no acute ST changes when compared to prior tracings. Given his AKI, he is not a cardiac catheterization candidate at this time and would not be an ideal cath candidate in general with his advanced age. In the setting of both of these, would  anticipate medical management and this was reviewed with the patient today. Will also review with Dr. Harrington Challenger.  - Would continue IV Heparin for 48 hours total. He is currently pain-free and can wean his IV NTG as able and restart PTA PO nitrates. Will obtain an updated echocardiogram as this would help guide medication adjustments if his EF is reduced.  - Continue Atorvastatin 80mg  daily and Lopressor 12.5mg  BID. Will restart ASA 81mg  daily.   2. CAD - He is s/p BMS to LAD in 2014 with repeat cath in 10/2016 showing patent stent and 80% distal LCx stenosis not suitable for PCI and medical management recommended. - Will plan for a repeat echocardiogram as outlined above.  - Continue Atorvastatin 80mg  daily and Lopressor 12.5mg  BID. Will restart ASA 81mg  daily.   3. Chronic Diastolic CHF/Pulmonary HTN/Tricuspid Regurgitation - Most recent echo in 01/2019 showed a preserved EF of  65 - 70% but he did have moderate to severe TR and moderately elevated PASP with RV systolic pressure at 33.5 mmHg. His pulmonary HTN is felt to be secondary to his known ILD. Not a candidate for advanced therapies given his age.  - On Lasix 60mg  daily prior to admission which is currently held given his AKI. Repeat echo is pending.   4.  [facility] now on 4L [place] at baseline.  5. HLD - LDL was at 41 in 06/2019. At goal of less than 70. Continue Atorvastatin 80mg  daily.   6. Acute on Chronic Stage 3 CKD - Creatinine was previously at 1.44 in 06/2019, elevated to 3.78 on admission. Lasix has been held. Continue to avoid nephrotoxic agents. Will recheck BMET this AM. May require gentle hydration.    For questions or updates, please contact [facility] Please consult [email or web address] for contact info under Cardiology/STEMI.  Signed, Erma Heritage, PA-C 04/13/2020, 8:44 AM Pager: [phone]  Patient seen and examined   I agree with findings as noted by B Strader above  Pt is a 86 yo with known severe CAD who presents with unstable angina   Troponin elevated at 57     Being treated with IV heparin and IV NTG    Still with some pressure in chest but improved  ON exam, patient is in NAD   Lungs are relatively clear Cardiac exam:  RRR  Gr II/VI systolic murmur base   I/VI diastolic murmur LSB   Abd is supple    Ext   no LE edema   Feet warm  Initial EKG without acute ST changes    Given severity of known disease noted above I would recommm medical Rx with heparin and NTG   Maximize meds as BP / HR tolerate Echo to reeval LV but also to reevaluate AV.    Will continue to follow  Dorris Carnes MD

## 2020-04-13 NOTE — Progress Notes (Signed)
Patient Demographics:    Anthony Galvan, is a 86 y.o. male, DOB - 29-Feb-1922, XTK:[id number]  Admit date - 04/12/2020   Admitting Physician Camrin Gearheart Denton Brick, MD  Outpatient Primary MD for the patient is Anthony Beard, MD  LOS - 0   Chief Complaint  Patient presents with  . Chest Pain        Subjective:    Anthony Galvan today has no fevers, no emesis,   -Patient has intermittent chest pains -Concerns about Lt arm area bleeding from infiltrated iv site  Assessment  & Plan :    Principal Problem:   Acute coronary syndrome with high troponin (HCC) Active Problems:   Hyperlipidemia with target low density lipoprotein (LDL) cholesterol less than 70 mg/dL   Essential hypertension   GERD   CAD S/P percutaneous coronary angioplasty   PUD (peptic ulcer disease)   Normocytic anemia   Hyperbilirubinemia   CKD (chronic kidney disease), stage V (HCC)   NSTEMI (non-ST elevated myocardial infarction) [facility])   Brief Summary:- 86 y.o. male with past medical history of CAD (s/p BMS to LAD in 2014, repeat cath in 10/2016 showing patent stent and 80% distal LCx stenosis not suitable for PCI and medical management recommended),ILD (on 4L [place] at baseline),chronic diastolic CHF,pulmonary HTN, moderate to severe TR,acute PE (diagnosed in 05/2018- no longer on anticoagulation),HTN, HLD,GERDand Stage 3 CKDB, PUD, and prior history of pulmonary bleeding  admitted on 04/12/2020 with NSTEMI   A/p 1)NSTEMI--renal function precludes [facility] cardiologist recommends medical management Troponin 271>>248 -EKG without acute findings, sinus rhythm -Continue IV heparin for 48 hours and then reevaluate with cardiology service -Echo on 04/13/2020 with EF of 65 to 99%, grade 1 diastolic dysfunction, no significant regional wall motion normalities -Continue IV nitro drip -Continue Lopressor 12.5 mg twice daily, aspirin 81 mg daily  and Lipitor 80 mg daily  2)AKI----acute kidney injury on CKD stage - 3B   -Baseline creatinine usually around 1.4, creatinine on admission up to 3.78 --renally adjust medications, avoid nephrotoxic agents / dehydration  / hypotension  3)CAD-- - He is s/p BMS to LAD in 2014 with repeat cath in 10/2016 showing patent stent and 80% distal LCx stenosis not suitable for PCI and medical management recommended----now with NSTEMI, management as above #1  4) chronic hypoxic respiratory failure due to interstitial lung disease--- oxygen requirement appears to be at baseline of 4 L/min at this time  5)HFpEF/Pulm HTN/tricuspid regurg--- repeat -Echo on 04/13/2020 with EF of 65 to 24%, grade 1 diastolic dysfunction, no significant regional wall motion normalities -Lasix on hold due to AKI -Pulmonary HTN most likely due to underlying lung disease/ILD -Previously evaluated by cardiology service and deemed not to be a candidate for advanced therapies for heart failure management to help with hypertension management  6)GERD/PUD--continue Protonix 40 mg daily  Disposition/Need for in-Hospital Stay- patient unable to be discharged at this time due to --NSTEMI requiring IV heparin and IV nitro drip, AKI requiring further monitoring  Status is: Inpatient  Remains inpatient appropriate because:Please see above   Disposition: The patient is from: Home              Anticipated d/c is to: Home  Anticipated d/c date is: > 3 days              Patient currently is not medically stable to d/c. Barriers: Not Clinically Stable-   Code Status :  -  Code Status: Full Code   Family Communication:    (patient is alert, awake and coherent) -Attempted to reach daughter Anthony Galvan at [phone] but no answer and no voicemail  Consults  :  cardiology  DVT Prophylaxis  :   - iv heparin    Lab Results  Component Value Date   PLT 126 (L) 04/13/2020    Inpatient Medications  Scheduled  Meds: . aspirin EC  81 mg Oral Daily  . atorvastatin  80 mg Oral Daily  . metoprolol tartrate  12.5 mg Oral BID  . pantoprazole  40 mg Oral Daily   Continuous Infusions: . heparin 750 Units/hr (04/13/20 0420)  . nitroGLYCERIN 40 mcg/min (04/13/20 0658)   PRN Meds:.acetaminophen **OR** acetaminophen, ondansetron **OR** ondansetron (ZOFRAN) IV    Anti-infectives (From admission, onward)   None        Objective:   Vitals:   04/13/20 1030 04/13/20 1100 04/13/20 1130 04/13/20 1430  BP: 121/75 125/66 (!) 121/51 (!) 116/57  Pulse: 74 69 63 64  Resp: (!) 22 (!) 24 (!) 21 17  Temp:      TempSrc:      SpO2: 96% 97% 98% 100%  Weight:      Height:        Wt Readings from Last 3 Encounters:  04/12/20 68 kg  10/01/19 72.6 kg  05/29/19 73 kg    No intake or output data in the 24 hours ending 04/13/20 1629  Physical Exam  Gen:- Awake Alert,  In no apparent distress  HEENT:- [place].AT, No sclera icterus Neck-Supple Neck,No JVD,.  Lungs-diminished breath sounds, no wheezing  CV- S1, S2 normal, irregular  Abd-  +ve B.Sounds, Abd Soft, No tenderness,    Extremity/Skin:- No  edema, pedal pulses present  Psych-affect is appropriate, oriented x3 Neuro-generalized weakness, no new focal deficits, no tremors   Data Review:   Micro Results Recent Results (from the past 240 hour(s))  Resp Panel by RT-PCR (Flu A&B, Covid) Nasopharyngeal Swab     Status: None   Collection Time: 04/12/20  3:27 PM   Specimen: Nasopharyngeal Swab; Nasopharyngeal(NP) swabs in vial transport medium  Result Value Ref Range Status   SARS Coronavirus 2 by RT PCR NEGATIVE NEGATIVE Final    Comment: (NOTE) SARS-CoV-2 target nucleic acids are NOT DETECTED.  The SARS-CoV-2 RNA is generally detectable in upper respiratory specimens during the acute phase of infection. The lowest concentration of SARS-CoV-2 viral copies this assay can detect is 138 copies/mL. A negative result does not preclude  SARS-Cov-2 infection and should not be used as the sole basis for treatment or other patient management decisions. A negative result may occur with  improper specimen collection/handling, submission of specimen other than nasopharyngeal swab, presence of viral mutation(s) within the areas targeted by this assay, and inadequate number of viral copies(<138 copies/mL). A negative result must be combined with clinical observations, patient history, and epidemiological information. The expected result is Negative.  Fact Sheet for Patients:  EntrepreneurPulse.com.au  Fact Sheet for Healthcare Providers:  IncredibleEmployment.be  This test is no t yet approved or cleared by the [place] FDA and  has been authorized for detection and/or diagnosis of SARS-CoV-2 by FDA under an Emergency Use Authorization (EUA). This EUA will  remain  in effect (meaning this test can be used) for the duration of the COVID-19 declaration under Section 564(b)(1) of the Act, 21 U.S.C.section 360bbb-3(b)(1), unless the authorization is terminated  or revoked sooner.       Influenza A by PCR NEGATIVE NEGATIVE Final   Influenza B by PCR NEGATIVE NEGATIVE Final    Comment: (NOTE) The Xpert Xpress SARS-CoV-2/FLU/RSV plus assay is intended as an aid in the diagnosis of influenza from Nasopharyngeal swab specimens and should not be used as a sole basis for treatment. Nasal washings and aspirates are unacceptable for Xpert Xpress SARS-CoV-2/FLU/RSV testing.  Fact Sheet for Patients: EntrepreneurPulse.com.au  Fact Sheet for Healthcare Providers: IncredibleEmployment.be  This test is not yet approved or cleared by the [place] FDA and has been authorized for detection and/or diagnosis of SARS-CoV-2 by FDA under an Emergency Use Authorization (EUA). This EUA will remain in effect (meaning this test can be used) for the duration of  the COVID-19 declaration under Section 564(b)(1) of the Act, 21 U.S.C. section 360bbb-3(b)(1), unless the authorization is terminated or revoked.  Performed at [facility], [address], [place], [place] [address]     Radiology Reports DG Chest 2 View  Result Date: 04/13/2020 CLINICAL DATA:  Right rib fracture, pneumothorax EXAM: CHEST - 2 VIEW COMPARISON:  04/12/2020 FINDINGS: Lung volumes are small with asymmetric right-sided volume loss, unchanged from prior examination. No superimposed confluent pulmonary infiltrate. Mild bibasilar scarring. No pneumothorax or pleural effusion. Cardiac size is mildly enlarged, unchanged. Pulmonary vascularity is normal. No acute bone abnormality. Degenerative changes are noted within the shoulders. IMPRESSION: Stable pulmonary hypoinflation.  No pneumothorax identified. Electronically Signed   By: Fidela Salisbury MD   On: 04/13/2020 05:26   DG Chest Port 1 View  Result Date: 04/12/2020 CLINICAL DATA:  Chest pain EXAM: PORTABLE CHEST 1 VIEW COMPARISON:  01/27/2019 FINDINGS: Heart size upper normal. Negative for heart failure or edema. Negative for pneumonia or effusion. Mild scarring in the bases unchanged. Bilateral rotator cuff impingement. IMPRESSION: Mild bibasilar scarring.  No superimposed acute abnormality. Electronically Signed   By: Franchot Gallo M.D.   On: 04/12/2020 14:20     CBC Recent Labs  Lab 04/12/20 1234 04/13/20 0311  WBC 9.8 8.2  HGB 12.3* 11.0*  HCT 39.7 36.6*  PLT 178 126*  MCV 96.1 98.9  MCH 29.8 29.7  MCHC 31.0 30.1  RDW 16.4* 16.5*  LYMPHSABS 2.5  --   MONOABS 1.0  --   EOSABS 0.1  --   BASOSABS 0.1  --     Chemistries  Recent Labs  Lab 04/12/20 1722 04/13/20 0854  NA 136 137  K 4.1 4.3  CL 101 103  CO2 21* 22  GLUCOSE 83 66*  BUN 58* 54*  CREATININE 3.78* 3.34*  CALCIUM 8.8* 8.5*  AST 39  --   ALT 23  --   ALKPHOS 67  --   BILITOT 2.0*  --     ------------------------------------------------------------------------------------------------------------------ No results for input(s): CHOL, HDL, LDLCALC, TRIG, CHOLHDL, LDLDIRECT in the last 72 hours.  Lab Results  Component Value Date   HGBA1C 5.4 08/01/2012   ------------------------------------------------------------------------------------------------------------------ No results for input(s): TSH, T4TOTAL, T3FREE, THYROIDAB in the last 72 hours.  Invalid input(s): FREET3 ------------------------------------------------------------------------------------------------------------------ No results for input(s): VITAMINB12, FOLATE, FERRITIN, TIBC, IRON, RETICCTPCT in the last 72 hours.  Coagulation profile No results for input(s): INR, PROTIME in the last 168 hours.  No results for input(s): DDIMER in the last 72 hours.  Cardiac Enzymes No results for input(s): CKMB, TROPONINI, MYOGLOBIN in the last 168 hours.  Invalid input(s): CK ------------------------------------------------------------------------------------------------------------------    Component Value Date/Time   BNP 437.0 (H) 04/12/2020 1722     Roxan Hockey M.D on 04/13/2020 at 4:29 PM  Go to [email or web address] - for contact info  [facility] - Office  [phone]

## 2020-04-13 NOTE — Progress Notes (Signed)
ANTICOAGULATION CONSULT NOTE -  Pharmacy Consult for IV Heparin Indication: chest pain/ACS  Allergies  Allergen Reactions  . Lisinopril Other (See Comments)    "Allergic," per the [facility]- patient is unaware of any reaction (??)    Patient Measurements: Height: 5\' 4"  (162.6 cm) Weight: 68 kg (150 lb) IBW/kg (Calculated) : 59.2 Heparin Dosing Weight:  68 kg  Vital Signs: BP: 121/51 (12/28 1130) Pulse Rate: 63 (12/28 1130)  Labs: Recent Labs    04/12/20 1234 04/12/20 1722 04/12/20 1908 04/13/20 0311 04/13/20 0853 04/13/20 0854 04/13/20 1216  HGB 12.3*  --   --  11.0*  --   --   --   HCT 39.7  --   --  36.6*  --   --   --   PLT 178  --   --  126*  --   --   --   HEPARINUNFRC  --   --   --  0.81*  --   --  0.44  CREATININE  --  3.78*  --   --   --  3.34*  --   TROPONINIHS  --  271* 248*  --  238*  --   --     Estimated Creatinine Clearance: 10.8 mL/min (A) (by C-G formula based on SCr of 3.34 mg/dL (H)).   Medical History: Past Medical History:  Diagnosis Date  . Bronchiectasis ([place]) 06/13/2018  . CAD (coronary artery disease) 08/01/2012   BMS mid LAD; catheterization in July 2018 showed with severe heavily calcified distal circumflex-left PDA stenosis, not favorable for PCI.  Marland Kitchen Congestive heart failure (CHF) ([place])   . Duodenal ulcer May 2011   on EGD  . Essential hypertension   . GERD (gastroesophageal reflux disease)   . Helicobacter pylori gastritis 2012  . Hyperlipidemia   . ILD (interstitial lung disease) ([place])   . MI (myocardial infarction) ([place]) 1976, 1980, [place] [facility] at [facility] and [place] (Dr. Iona Beard)  . Osteoarthritis   . Pulmonary embolism ([place])   . Pulmonary hypertension ([place])   . S/P colonoscopy 2011   normal, internal hemorrhoids    Assessment: 86 yr old man with hx of CAD and multiple MIs presented to [facility] ED with CP. Pharmacy is consulted to dose IV heparin for ACS; per med rec, pt was on not anticoagulant PTA.  H/H  12.3/39.7, platelets 178, Scr 3.78, CrCl ~10 ml/min (per Epic record, Scr was 1.60 ~ 1 yr ago)  HL 0.44 is therapeutic  Goal of Therapy:  Heparin level 0.3-0.7 units/ml Monitor platelets by anticoagulation protocol: Yes   Plan:  Continue heparin infusion at 750 units/hr and check level in 8 hours Monitor daily heparin level, CBC Monitor for signs/symptoms of bleeding  Isac Sarna, BS Vena [place], BCPS Clinical Pharmacist Pager [phone] 04/13/2020,1:16 PM

## 2020-04-13 NOTE — ED Notes (Signed)
Receiving nurse unable to take report. Will call me back.

## 2020-04-13 NOTE — Progress Notes (Signed)
ANTICOAGULATION CONSULT NOTE - Follow Up Consult  Pharmacy Consult for heparin Indication: chest pain/ACS  Labs: Recent Labs    04/12/20 1234 04/12/20 1722 04/12/20 1908 04/13/20 0311  HGB 12.3*  --   --  11.0*  HCT 39.7  --   --  36.6*  PLT 178  --   --  126*  HEPARINUNFRC  --   --   --  0.81*  CREATININE  --  3.78*  --   --   TROPONINIHS  --  271* 248*  --     Assessment: 86yo male supratherapeutic on heparin with initial dosing for CP, though pt rec'd fairly large bolus which may still be affecting level given age and SCr; no gtt issues or signs of bleeding per RN.  Goal of Therapy:  Heparin level 0.3-0.7 units/ml   Plan:  Will decrease heparin gtt by 1-2 units/kg/hr to 750 units/hr and check level in 8 hours.    Wynona Neat, PharmD, BCPS  04/13/2020,4:10 AM

## 2020-04-14 DIAGNOSIS — I249 Acute ischemic heart disease, unspecified: Secondary | ICD-10-CM | POA: Diagnosis not present

## 2020-04-14 LAB — CBC
HCT: 33.6 % — ABNORMAL LOW (ref 39.0–52.0)
Hemoglobin: 10.4 g/dL — ABNORMAL LOW (ref 13.0–17.0)
MCH: 29.3 pg (ref 26.0–34.0)
MCHC: 31 g/dL (ref 30.0–36.0)
MCV: 94.6 fL (ref 80.0–100.0)
Platelets: 143 10*3/uL — ABNORMAL LOW (ref 150–400)
RBC: 3.55 MIL/uL — ABNORMAL LOW (ref 4.22–5.81)
RDW: 16.2 % — ABNORMAL HIGH (ref 11.5–15.5)
WBC: 9.2 10*3/uL (ref 4.0–10.5)
nRBC: 0 % (ref 0.0–0.2)

## 2020-04-14 LAB — BASIC METABOLIC PANEL
Anion gap: 11 (ref 5–15)
BUN: 47 mg/dL — ABNORMAL HIGH (ref 8–23)
CO2: 24 mmol/L (ref 22–32)
Calcium: 8.6 mg/dL — ABNORMAL LOW (ref 8.9–10.3)
Chloride: 102 mmol/L (ref 98–111)
Creatinine, Ser: 2.97 mg/dL — ABNORMAL HIGH (ref 0.61–1.24)
GFR, Estimated: 19 mL/min — ABNORMAL LOW (ref >60)
Glucose, Bld: 95 mg/dL (ref 70–99)
Potassium: 4.5 mmol/L (ref 3.5–5.1)
Sodium: 137 mmol/L (ref 135–145)

## 2020-04-14 LAB — HEPARIN LEVEL (UNFRACTIONATED): Heparin Unfractionated: 0.49 IU/mL (ref 0.30–0.70)

## 2020-04-14 LAB — MRSA PCR SCREENING: MRSA by PCR: NEGATIVE

## 2020-04-14 MED ORDER — ISOSORBIDE DINITRATE 10 MG PO TABS: 20.0000 mg | ORAL_TABLET | Freq: Three times a day (TID) | ORAL | Status: DC

## 2020-04-14 MED ORDER — ENSURE ENLIVE PO LIQD
237.0000 mL | Freq: Two times a day (BID) | ORAL | Status: DC
  Administered 2020-04-14 – 2020-04-19 (×6): 237 mL via ORAL

## 2020-04-14 MED ORDER — ADULT MULTIVITAMIN W/MINERALS CH
1.0000 | ORAL_TABLET | Freq: Every day | ORAL | Status: DC
  Administered 2020-04-14 – 2020-04-19 (×6): 1 via ORAL
  Filled 2020-04-14 (×6): qty 1

## 2020-04-14 MED ORDER — IPRATROPIUM-ALBUTEROL 0.5-2.5 (3) MG/3ML IN SOLN
3.0000 mL | Freq: Three times a day (TID) | RESPIRATORY_TRACT | Status: DC
  Administered 2020-04-14 – 2020-04-16 (×6): 3 mL via RESPIRATORY_TRACT
  Filled 2020-04-14 (×6): qty 3

## 2020-04-14 MED ORDER — ISOSORBIDE DINITRATE 10 MG PO TABS
20.0000 mg | ORAL_TABLET | Freq: Three times a day (TID) | ORAL | Status: DC
  Administered 2020-04-14 (×2): 20 mg via ORAL
  Filled 2020-04-14 (×2): qty 2

## 2020-04-14 MED ORDER — GUAIFENESIN-DM 100-10 MG/5ML PO SYRP
5.0000 mL | ORAL_SOLUTION | ORAL | Status: DC | PRN
  Administered 2020-04-14: 5 mL via ORAL
  Filled 2020-04-14: qty 5

## 2020-04-14 MED ORDER — ORAL CARE MOUTH RINSE
15.0000 mL | Freq: Two times a day (BID) | OROMUCOSAL | Status: DC
  Administered 2020-04-14 – 2020-04-18 (×8): 15 mL via OROMUCOSAL

## 2020-04-14 NOTE — Progress Notes (Signed)
Progress Note    Anthony Galvan  MLJ:[id number] DOB: Jul 02, 1921  DOA: 04/12/2020 PCP: Iona Beard, MD    Brief Narrative:     Medical records reviewed and are as summarized below:  Anthony Galvan is an 86 y.o. male with past medical history of coronary artery disease, interstitial lung disease on 4 L nasal cannula, moderate to severe tricuspid regurg, who was admitted on 12/27 with non-STEMI.  He has chronic kidney disease and therefore cannot have left heart cath so cardiology is recommending medical management.  Assessment/Plan:   Principal Problem:   Acute coronary syndrome with high troponin (HCC) Active Problems:   Hyperlipidemia with target low density lipoprotein (LDL) cholesterol less than 70 mg/dL   Essential hypertension   GERD   CAD S/P percutaneous coronary angioplasty   PUD (peptic ulcer disease)   Normocytic anemia   Hyperbilirubinemia   CKD (chronic kidney disease), stage V (HCC)   NSTEMI (non-ST elevated myocardial infarction) ([place])    NSTEMI--renal function precludes LHC cardiologist recommends medical management Troponin 271>>248 -EKG without acute findings -Continue IV heparin per cardiology service -Echo on 04/13/2020 with EF of 65 to 12%, grade 1 diastolic dysfunction, no significant regional wall motion normalities -Continue IV nitro drip -Continue Lopressor 12.5 mg twice daily, aspirin 81 mg daily and Lipitor 80 mg daily -echo:EF of 65 to 19%, grade 1 diastolic dysfunction, no significant regional wall motion normalities  AKI----acute kidney injury on CKD stage - 3B   -Baseline creatinine usually around 1.4, creatinine on admission up to 3.78 --renally adjust medications, avoid nephrotoxic agents / dehydration  / hypotension -labs ordered for this AM  CAD-- -  -- s/p BMS to LAD in 2014withrepeat cath in 10/2016 showing patent stent and 80% distal LCx stenosis not suitable for PCI and medical management recommended  chronic hypoxic  respiratory failure due to interstitial lung disease - oxygen requirement appears to be at baseline of 4 L/min at this time  HFpEF/Pulm HTN/tricuspid regurg--- repeat -Echo on 04/13/2020 with EF of 65 to 75%, grade 1 diastolic dysfunction, no significant regional wall motion normalities -Lasix on hold due to AKI -Pulmonary HTN most likely due to underlying lung disease/ILD -Previously evaluated by cardiology service and deemed not to be a candidate for advanced therapies for heart failure management to help with hypertension management  GERD/PUD --continue Protonix 40 mg daily  Family Communication/Anticipated D/C date and plan/Code Status   DVT prophylaxis: Heparin drip Code Status: Full Code.  Disposition Plan: Status is: Inpatient  Remains inpatient appropriate because:Inpatient level of care appropriate due to severity of illness   Dispo: The patient is from: Home              Anticipated d/c is to: Home              Anticipated d/c date is: > 3 days              Patient currently is not medically stable to d/c.         Medical Consultants:    Cardiology  Subjective:   Still with some chest discomfort at rest  Objective:    Vitals:   04/14/20 0400 04/14/20 0430 04/14/20 0700 04/14/20 0931  BP: 111/63  (!) 114/59   Pulse: 61  66 71  Resp: 20  18   Temp:  97.6 F (36.4 C) 97.7 F (36.5 C)   TempSrc:  Oral Oral   SpO2: 100%  100%   Weight:  Height:        Intake/Output Summary (Last 24 hours) at 04/14/2020 1015 Last data filed at 04/14/2020 0800 Gross per 24 hour  Intake 1147.78 ml  Output 100 ml  Net 1047.78 ml   Filed Weights   04/12/20 1324 04/13/20 2146  Weight: 68 kg 63.5 kg    Exam:  General: Appearance:    Well developed, well nourished male in no acute distress   Bleeding around IV in right arm  Lungs:     No wheezing, respirations unlabored  Heart:    Normal heart rate. Normal rhythm. No murmurs, rubs, or gallops.   MS:   All  extremities are intact.   Neurologic:   Awake, alert, oriented x 3. No apparent focal neurological           defect.     Data Reviewed:   I have personally reviewed following labs and imaging studies:  Labs: Labs show the following:   Basic Metabolic Panel: Recent Labs  Lab 04/12/20 1722 04/13/20 0854  NA 136 137  K 4.1 4.3  CL 101 103  CO2 21* 22  GLUCOSE 83 66*  BUN 58* 54*  CREATININE 3.78* 3.34*  CALCIUM 8.8* 8.5*   GFR Estimated Creatinine Clearance: 10.8 mL/min (A) (by C-G formula based on SCr of 3.34 mg/dL (H)). Liver Function Tests: Recent Labs  Lab 04/12/20 1722  AST 39  ALT 23  ALKPHOS 67  BILITOT 2.0*  PROT 6.8  ALBUMIN 3.5   No results for input(s): LIPASE, AMYLASE in the last 168 hours. No results for input(s): AMMONIA in the last 168 hours. Coagulation profile No results for input(s): INR, PROTIME in the last 168 hours.  CBC: Recent Labs  Lab 04/12/20 1234 04/13/20 0311 04/14/20 0105  WBC 9.8 8.2 9.2  NEUTROABS 6.2  --   --   HGB 12.3* 11.0* 10.4*  HCT 39.7 36.6* 33.6*  MCV 96.1 98.9 94.6  PLT 178 126* 143*   Cardiac Enzymes: No results for input(s): CKTOTAL, CKMB, CKMBINDEX, TROPONINI in the last 168 hours. BNP (last 3 results) No results for input(s): PROBNP in the last 8760 hours. CBG: No results for input(s): GLUCAP in the last 168 hours. D-Dimer: No results for input(s): DDIMER in the last 72 hours. Hgb A1c: No results for input(s): HGBA1C in the last 72 hours. Lipid Profile: No results for input(s): CHOL, HDL, LDLCALC, TRIG, CHOLHDL, LDLDIRECT in the last 72 hours. Thyroid function studies: No results for input(s): TSH, T4TOTAL, T3FREE, THYROIDAB in the last 72 hours.  Invalid input(s): FREET3 Anemia work up: No results for input(s): VITAMINB12, FOLATE, FERRITIN, TIBC, IRON, RETICCTPCT in the last 72 hours. Sepsis Labs: Recent Labs  Lab 04/12/20 1234 04/13/20 0311 04/14/20 0105  WBC 9.8 8.2 9.2     Microbiology Recent Results (from the past 240 hour(s))  Resp Panel by RT-PCR (Flu A&B, Covid) Nasopharyngeal Swab     Status: None   Collection Time: 04/12/20  3:27 PM   Specimen: Nasopharyngeal Swab; Nasopharyngeal(NP) swabs in vial transport medium  Result Value Ref Range Status   SARS Coronavirus 2 by RT PCR NEGATIVE NEGATIVE Final    Comment: (NOTE) SARS-CoV-2 target nucleic acids are NOT DETECTED.  The SARS-CoV-2 RNA is generally detectable in upper respiratory specimens during the acute phase of infection. The lowest concentration of SARS-CoV-2 viral copies this assay can detect is 138 copies/mL. A negative result does not preclude SARS-Cov-2 infection and should not be used as the sole basis for treatment  or other patient management decisions. A negative result may occur with  improper specimen collection/handling, submission of specimen other than nasopharyngeal swab, presence of viral mutation(s) within the areas targeted by this assay, and inadequate number of viral copies(<138 copies/mL). A negative result must be combined with clinical observations, patient history, and epidemiological information. The expected result is Negative.  Fact Sheet for Patients:  EntrepreneurPulse.com.au  Fact Sheet for Healthcare Providers:  IncredibleEmployment.be  This test is no t yet approved or cleared by the [place] FDA and  has been authorized for detection and/or diagnosis of SARS-CoV-2 by FDA under an Emergency Use Authorization (EUA). This EUA will remain  in effect (meaning this test can be used) for the duration of the COVID-19 declaration under Section 564(b)(1) of the Act, 21 U.S.C.section 360bbb-3(b)(1), unless the authorization is terminated  or revoked sooner.       Influenza A by PCR NEGATIVE NEGATIVE Final   Influenza B by PCR NEGATIVE NEGATIVE Final    Comment: (NOTE) The Xpert Xpress SARS-CoV-2/FLU/RSV plus assay is  intended as an aid in the diagnosis of influenza from Nasopharyngeal swab specimens and should not be used as a sole basis for treatment. Nasal washings and aspirates are unacceptable for Xpert Xpress SARS-CoV-2/FLU/RSV testing.  Fact Sheet for Patients: EntrepreneurPulse.com.au  Fact Sheet for Healthcare Providers: IncredibleEmployment.be  This test is not yet approved or cleared by the [place] FDA and has been authorized for detection and/or diagnosis of SARS-CoV-2 by FDA under an Emergency Use Authorization (EUA). This EUA will remain in effect (meaning this test can be used) for the duration of the COVID-19 declaration under Section 564(b)(1) of the Act, 21 U.S.C. section 360bbb-3(b)(1), unless the authorization is terminated or revoked.  Performed at [facility], [address], [place], [place] [address]   MRSA PCR Screening     Status: None   Collection Time: 04/13/20 12:43 AM   Specimen: Nasal Mucosa; Nasopharyngeal  Result Value Ref Range Status   MRSA by PCR NEGATIVE NEGATIVE Final    Comment:        The GeneXpert MRSA Assay (FDA approved for NASAL specimens only), is one component of a comprehensive MRSA colonization surveillance program. It is not intended to diagnose MRSA infection nor to guide or monitor treatment for MRSA infections. Performed at [facility], [place] [address] [place], [place] [address]     Procedures and diagnostic studies:  DG Chest 2 View  Result Date: 04/13/2020 CLINICAL DATA:  Right rib fracture, pneumothorax EXAM: CHEST - 2 VIEW COMPARISON:  04/12/2020 FINDINGS: Lung volumes are small with asymmetric right-sided volume loss, unchanged from prior examination. No superimposed confluent pulmonary infiltrate. Mild bibasilar scarring. No pneumothorax or pleural effusion. Cardiac size is mildly enlarged, unchanged. Pulmonary vascularity is normal. No acute bone abnormality. Degenerative changes  are noted within the shoulders. IMPRESSION: Stable pulmonary hypoinflation.  No pneumothorax identified. Electronically Signed   By: Fidela Salisbury MD   On: 04/13/2020 05:26   DG Chest Port 1 View  Result Date: 04/12/2020 CLINICAL DATA:  Chest pain EXAM: PORTABLE CHEST 1 VIEW COMPARISON:  01/27/2019 FINDINGS: Heart size upper normal. Negative for heart failure or edema. Negative for pneumonia or effusion. Mild scarring in the bases unchanged. Bilateral rotator cuff impingement. IMPRESSION: Mild bibasilar scarring.  No superimposed acute abnormality. Electronically Signed   By: Franchot Gallo M.D.   On: 04/12/2020 14:20   ECHOCARDIOGRAM COMPLETE  Result Date: 04/13/2020    ECHOCARDIOGRAM REPORT   Patient  Name:   Anthony Galvan Date of Exam: 04/13/2020 Medical Rec #:  [id number]      Height:       64.0 in Accession #:    [id number]     Weight:       150.0 lb Date of Birth:  13-Feb-1922     BSA:          1.731 m Patient Age:    73 years       BP:           121/51 mmHg Patient Gender: M              HR:           63 bpm. Exam Location:  [facility] Procedure: 2D Echo, Cardiac Doppler and Color Doppler Indications:    NSTEMI I21.4  History:        Patient has prior history of Echocardiogram examinations, most                 recent 01/25/2019. CHF, Previous Myocardial Infarction and CAD;                 Risk Factors:Hypertension, Dyslipidemia and Non-Smoker. S/P                 percutaneous coronary angioplasty.  Sonographer:    Alvino Chapel RCS Referring Phys: [id number] [place]  1. Left ventricular ejection fraction, by estimation, is 65 to 70%. The left ventricle has normal function. The left ventricle has no regional wall motion abnormalities. There is mild left ventricular hypertrophy. Left ventricular diastolic parameters are consistent with Grade I diastolic dysfunction (impaired relaxation).  2. Right ventricular systolic function is low normal. The right ventricular size is  moderately enlarged. Mildly increased right ventricular wall thickness. There is moderately elevated pulmonary artery systolic pressure.  3. Right atrial size was severely dilated.  4. The mitral valve is normal in structure. Trivial mitral valve regurgitation.  5. Tricuspid valve regurgitation is moderate to severe.  6. The aortic valve is abnormal. Aortic valve regurgitation is trivial. Mild to moderate aortic valve sclerosis/calcification is present, without any evidence of aortic stenosis.  7. The inferior vena cava is dilated in size with <50% respiratory variability, suggesting right atrial pressure of 15 mmHg. Comparison(s): No significant change from prior study. FINDINGS  Left Ventricle: Left ventricular ejection fraction, by estimation, is 65 to 70%. The left ventricle has normal function. The left ventricle has no regional wall motion abnormalities. The left ventricular internal cavity size was normal in size. There is  mild left ventricular hypertrophy. Left ventricular diastolic parameters are consistent with Grade I diastolic dysfunction (impaired relaxation). Right Ventricle: The right ventricular size is moderately enlarged. Mildly increased right ventricular wall thickness. Right ventricular systolic function is low normal. There is moderately elevated pulmonary artery systolic pressure. The tricuspid regurgitant velocity is 3.08 m/s, and with an assumed right atrial pressure of 15 mmHg, the estimated right ventricular systolic pressure is 86.7 mmHg. Left Atrium: Left atrial size was normal in size. Right Atrium: Right atrial size was severely dilated. Pericardium: Trivial pericardial effusion is present. Mitral Valve: The mitral valve is normal in structure. Trivial mitral valve regurgitation. Tricuspid Valve: The tricuspid valve is grossly normal. Tricuspid valve regurgitation is moderate to severe. Aortic Valve: The aortic valve is abnormal. Aortic valve regurgitation is trivial. Mild to moderate  aortic valve sclerosis/calcification is present, without any evidence of aortic stenosis. Pulmonic Valve: The pulmonic valve was  grossly normal. Pulmonic valve regurgitation is not visualized. Aorta: The aortic root is normal in size and structure. Venous: The inferior vena cava is dilated in size with less than 50% respiratory variability, suggesting right atrial pressure of 15 mmHg. IAS/Shunts: The interatrial septum was not assessed.  LEFT VENTRICLE PLAX 2D LVIDd:         3.60 cm  Diastology LVIDs:         2.40 cm  LV e' medial:    5.55 cm/s LV PW:         1.40 cm  LV E/e' medial:  13.1 LV IVS:        1.40 cm  LV e' lateral:   6.96 cm/s LVOT diam:     2.00 cm  LV E/e' lateral: 10.4 LV SV:         76 LV SV Index:   44 LVOT Area:     3.14 cm  RIGHT VENTRICLE RV S prime:     10.20 cm/s TAPSE (M-mode): 2.2 cm LEFT ATRIUM           Index       RIGHT ATRIUM           Index LA diam:      3.40 cm 1.96 cm/m  RA Area:     25.20 cm LA Vol (A2C): 41.6 ml 24.00 ml/m RA Volume:   78.60 ml  45.40 ml/m LA Vol (A4C): 48.6 ml 28.07 ml/m  AORTIC VALVE LVOT Vmax:   77.30 cm/s LVOT Vmean:  48.900 cm/s LVOT VTI:    0.242 m  AORTA Ao Root diam: 3.10 cm MITRAL VALVE               TRICUSPID VALVE MV Area (PHT): 3.40 cm    TR Peak grad:   37.9 mmHg MV Decel Time: 223 msec    TR Vmax:        308.00 cm/s MV E velocity: 72.70 cm/s MV A velocity: 72.30 cm/s  SHUNTS MV E/A ratio:  1.01        Systemic VTI:  0.24 m                            Systemic Diam: 2.00 cm Dorris Carnes MD Electronically signed by Dorris Carnes MD Signature Date/Time: 04/13/2020/4:35:46 PM    Final     Medications:   . aspirin EC  81 mg Oral Daily  . atorvastatin  80 mg Oral Daily  . feeding supplement  237 mL Oral BID BM  . mouth rinse  15 mL Mouth Rinse BID  . metoprolol tartrate  12.5 mg Oral BID  . multivitamin with minerals  1 tablet Oral Daily  . pantoprazole  40 mg Oral Daily   Continuous Infusions: . heparin 750 Units/hr (04/14/20 0800)  .  nitroGLYCERIN 40 mcg/min (04/14/20 0800)     LOS: 1 day   Geradine Girt  [facility]   How to contact the [facility] Attending or Consulting provider [place] or covering provider during after hours [place], for this patient?  1. Check the care team in [facility] and look for a) attending/consulting [facility] provider listed and b) the [facility] team listed 2. Log into [email or web address] and use [place]'s universal password to access. If you do not have the password, please contact the hospital operator. 3. Locate the [facility] provider you are looking for under [facility] and page to a number that  you can be directly reached. 4. If you still have difficulty reaching the provider, please page the [facility] (Director on Call) for the Hospitalists listed on amion for assistance.  04/14/2020, 10:15 AM

## 2020-04-14 NOTE — Progress Notes (Signed)
Patient c/o chest pain, patient stated  sharp pain on Mid upper chest. Increased NTG from 35 to 40 mcg/kg/hr. Patient kept sleeping even though he has pain. Paging Cardiac team regarding this, still awaiting for call back. HS [employer]

## 2020-04-14 NOTE — Progress Notes (Signed)
ANTICOAGULATION CONSULT NOTE -  Pharmacy Consult for IV Heparin Indication: chest pain/ACS  Labs: Recent Labs    04/12/20 1234 04/12/20 1234 04/12/20 1722 04/12/20 1908 04/13/20 0311 04/13/20 0853 04/13/20 0854 04/13/20 1216 04/13/20 2014 04/14/20 0105  HGB 12.3*  --   --   --  11.0*  --   --   --   --  10.4*  HCT 39.7  --   --   --  36.6*  --   --   --   --  33.6*  PLT 178  --   --   --  126*  --   --   --   --  143*  HEPARINUNFRC  --    < >  --   --  0.81*  --   --  0.44 0.42 0.49  CREATININE  --   --  3.78*  --   --   --  3.34*  --   --   --   TROPONINIHS  --   --  271* 248*  --  238*  --   --   --   --    < > = values in this interval not displayed.   Assessment: 86 yr old man with hx of CAD and multiple MIs presented to [facility] ED with CP. Pharmacy is consulted to dose IV heparin for ACS; per med rec, pt was on not anticoagulant PTA.  Heparin level this am 0.49 units/ml  Goal of Therapy:  Heparin level 0.3-0.7 units/ml Monitor platelets by anticoagulation protocol: Yes   Plan:  Continue heparin infusion at 750 units/hr Monitor daily heparin level, CBC Monitor for signs/symptoms of bleeding  Thanks for allowing pharmacy to be a part of this patient's care.  Excell Seltzer, PharmD Clinical Pharmacist 04/14/2020,1:56 AM

## 2020-04-14 NOTE — Progress Notes (Signed)
PA [place] notified patient's chest pain and increased NTG. PA Dorene Ar wanted to stay the NTG drip for now. Heparin will be discontinued at 1930 today. Patient had nose bleeding on Lt. Nares when he coughed , but stopped. Patient stated that his pain is better now. Continue to monitor patient's condition. HS [employer]

## 2020-04-14 NOTE — Progress Notes (Signed)
Progress Note  Patient Name: Anthony Galvan Date of Encounter: 04/14/2020  Primary Cardiologist:   Rozann Lesches, MD   Subjective   He is a lovely man to talk to but slightly confused.  He reports mild chest pain but cannot really qualify or quantify.  Clearly not in distress and he has a sanguine attitude.  "I recognize everything has a beginning and an end and I am fine with that."    Inpatient Medications    Scheduled Meds: . aspirin EC  81 mg Oral Daily  . atorvastatin  80 mg Oral Daily  . mouth rinse  15 mL Mouth Rinse BID  . metoprolol tartrate  12.5 mg Oral BID  . pantoprazole  40 mg Oral Daily   Continuous Infusions: . heparin 750 Units/hr (04/14/20 0400)  . nitroGLYCERIN 40 mcg/min (04/14/20 0742)   PRN Meds: acetaminophen **OR** acetaminophen, ondansetron **OR** ondansetron (ZOFRAN) IV   Vital Signs    Vitals:   04/13/20 2305 04/14/20 0400 04/14/20 0430 04/14/20 0700  BP:  111/63  (!) 114/59  Pulse:  61  66  Resp:  20  18  Temp: (!) 97.5 F (36.4 C)  97.6 F (36.4 C) 97.7 F (36.5 C)  TempSrc: Axillary  Oral Oral  SpO2:  100%  100%  Weight:      Height:        Intake/Output Summary (Last 24 hours) at 04/14/2020 0756 Last data filed at 04/14/2020 0400 Gross per 24 hour  Intake 864.49 ml  Output 100 ml  Net 764.49 ml   Filed Weights   04/12/20 1324 04/13/20 2146  Weight: 68 kg 63.5 kg    Telemetry    NSR - Personally Reviewed  ECG    NA - Personally Reviewed  Physical Exam   GEN: No acute distress.   Neck: No  JVD Cardiac: RRR, no murmurs, rubs, or gallops.  Respiratory:    Diffuse bilateral crackles GI: Soft, nontender, non-distended  MS: Mild bilateral leg edema.  Neuro:  Nonfocal  Psych: Normal affect but slightly confused.   Labs    Chemistry Recent Labs  Lab 04/12/20 1722 04/13/20 0854  NA 136 137  K 4.1 4.3  CL 101 103  CO2 21* 22  GLUCOSE 83 66*  BUN 58* 54*  CREATININE 3.78* 3.34*  CALCIUM 8.8* 8.5*  PROT  6.8  --   ALBUMIN 3.5  --   AST 39  --   ALT 23  --   ALKPHOS 67  --   BILITOT 2.0*  --   GFRNONAA 14* 16*  ANIONGAP 14 12     Hematology Recent Labs  Lab 04/12/20 1234 04/13/20 0311 04/14/20 0105  WBC 9.8 8.2 9.2  RBC 4.13* 3.70* 3.55*  HGB 12.3* 11.0* 10.4*  HCT 39.7 36.6* 33.6*  MCV 96.1 98.9 94.6  MCH 29.8 29.7 29.3  MCHC 31.0 30.1 31.0  RDW 16.4* 16.5* 16.2*  PLT 178 126* 143*    Cardiac EnzymesNo results for input(s): TROPONINI in the last 168 hours. No results for input(s): TROPIPOC in the last 168 hours.   BNP Recent Labs  Lab 04/12/20 1722  BNP 437.0*     DDimer No results for input(s): DDIMER in the last 168 hours.   Radiology    DG Chest 2 View  Result Date: 04/13/2020 CLINICAL DATA:  Right rib fracture, pneumothorax EXAM: CHEST - 2 VIEW COMPARISON:  04/12/2020 FINDINGS: Lung volumes are small with asymmetric right-sided volume loss, unchanged from prior  examination. No superimposed confluent pulmonary infiltrate. Mild bibasilar scarring. No pneumothorax or pleural effusion. Cardiac size is mildly enlarged, unchanged. Pulmonary vascularity is normal. No acute bone abnormality. Degenerative changes are noted within the shoulders. IMPRESSION: Stable pulmonary hypoinflation.  No pneumothorax identified. Electronically Signed   By: Fidela Salisbury MD   On: 04/13/2020 05:26   DG Chest Port 1 View  Result Date: 04/12/2020 CLINICAL DATA:  Chest pain EXAM: PORTABLE CHEST 1 VIEW COMPARISON:  01/27/2019 FINDINGS: Heart size upper normal. Negative for heart failure or edema. Negative for pneumonia or effusion. Mild scarring in the bases unchanged. Bilateral rotator cuff impingement. IMPRESSION: Mild bibasilar scarring.  No superimposed acute abnormality. Electronically Signed   By: Franchot Gallo M.D.   On: 04/12/2020 14:20   ECHOCARDIOGRAM COMPLETE  Result Date: 04/13/2020    ECHOCARDIOGRAM REPORT   Patient Name:   Anthony Galvan Date of Exam: 04/13/2020 Medical  Rec #:  [id number]      Height:       64.0 in Accession #:    [id number]     Weight:       150.0 lb Date of Birth:  1922/03/14     BSA:          1.731 m Patient Age:    86 years       BP:           121/51 mmHg Patient Gender: M              HR:           63 bpm. Exam Location:  [facility] Procedure: 2D Echo, Cardiac Doppler and Color Doppler Indications:    NSTEMI I21.4  History:        Patient has prior history of Echocardiogram examinations, most                 recent 01/25/2019. CHF, Previous Myocardial Infarction and CAD;                 Risk Factors:Hypertension, Dyslipidemia and Non-Smoker. S/P                 percutaneous coronary angioplasty.  Sonographer:    Alvino Chapel RCS Referring Phys: [id number] [place]  1. Left ventricular ejection fraction, by estimation, is 65 to 70%. The left ventricle has normal function. The left ventricle has no regional wall motion abnormalities. There is mild left ventricular hypertrophy. Left ventricular diastolic parameters are consistent with Grade I diastolic dysfunction (impaired relaxation).  2. Right ventricular systolic function is low normal. The right ventricular size is moderately enlarged. Mildly increased right ventricular wall thickness. There is moderately elevated pulmonary artery systolic pressure.  3. Right atrial size was severely dilated.  4. The mitral valve is normal in structure. Trivial mitral valve regurgitation.  5. Tricuspid valve regurgitation is moderate to severe.  6. The aortic valve is abnormal. Aortic valve regurgitation is trivial. Mild to moderate aortic valve sclerosis/calcification is present, without any evidence of aortic stenosis.  7. The inferior vena cava is dilated in size with <50% respiratory variability, suggesting right atrial pressure of 15 mmHg. Comparison(s): No significant change from prior study. FINDINGS  Left Ventricle: Left ventricular ejection fraction, by estimation, is 65 to 70%. The left  ventricle has normal function. The left ventricle has no regional wall motion abnormalities. The left ventricular internal cavity size was normal in size. There is  mild left ventricular hypertrophy. Left ventricular diastolic parameters are consistent  with Grade I diastolic dysfunction (impaired relaxation). Right Ventricle: The right ventricular size is moderately enlarged. Mildly increased right ventricular wall thickness. Right ventricular systolic function is low normal. There is moderately elevated pulmonary artery systolic pressure. The tricuspid regurgitant velocity is 3.08 m/s, and with an assumed right atrial pressure of 15 mmHg, the estimated right ventricular systolic pressure is 69.6 mmHg. Left Atrium: Left atrial size was normal in size. Right Atrium: Right atrial size was severely dilated. Pericardium: Trivial pericardial effusion is present. Mitral Valve: The mitral valve is normal in structure. Trivial mitral valve regurgitation. Tricuspid Valve: The tricuspid valve is grossly normal. Tricuspid valve regurgitation is moderate to severe. Aortic Valve: The aortic valve is abnormal. Aortic valve regurgitation is trivial. Mild to moderate aortic valve sclerosis/calcification is present, without any evidence of aortic stenosis. Pulmonic Valve: The pulmonic valve was grossly normal. Pulmonic valve regurgitation is not visualized. Aorta: The aortic root is normal in size and structure. Venous: The inferior vena cava is dilated in size with less than 50% respiratory variability, suggesting right atrial pressure of 15 mmHg. IAS/Shunts: The interatrial septum was not assessed.  LEFT VENTRICLE PLAX 2D LVIDd:         3.60 cm  Diastology LVIDs:         2.40 cm  LV e' medial:    5.55 cm/s LV PW:         1.40 cm  LV E/e' medial:  13.1 LV IVS:        1.40 cm  LV e' lateral:   6.96 cm/s LVOT diam:     2.00 cm  LV E/e' lateral: 10.4 LV SV:         76 LV SV Index:   44 LVOT Area:     3.14 cm  RIGHT VENTRICLE RV S  prime:     10.20 cm/s TAPSE (M-mode): 2.2 cm LEFT ATRIUM           Index       RIGHT ATRIUM           Index LA diam:      3.40 cm 1.96 cm/m  RA Area:     25.20 cm LA Vol (A2C): 41.6 ml 24.00 ml/m RA Volume:   78.60 ml  45.40 ml/m LA Vol (A4C): 48.6 ml 28.07 ml/m  AORTIC VALVE LVOT Vmax:   77.30 cm/s LVOT Vmean:  48.900 cm/s LVOT VTI:    0.242 m  AORTA Ao Root diam: 3.10 cm MITRAL VALVE               TRICUSPID VALVE MV Area (PHT): 3.40 cm    TR Peak grad:   37.9 mmHg MV Decel Time: 223 msec    TR Vmax:        308.00 cm/s MV E velocity: 72.70 cm/s MV A velocity: 72.30 cm/s  SHUNTS MV E/A ratio:  1.01        Systemic VTI:  0.24 m                            Systemic Diam: 2.00 cm Dorris Carnes MD Electronically signed by Dorris Carnes MD Signature Date/Time: 04/13/2020/4:35:46 PM    Final     Cardiac Studies   Echo  1. Left ventricular ejection fraction, by estimation, is 65 to 70%. The  left ventricle has normal function. The left ventricle has no regional  wall motion abnormalities. There is mild left ventricular hypertrophy.  Left ventricular diastolic parameters  are consistent with Grade I diastolic dysfunction (impaired relaxation).  2. Right ventricular systolic function is low normal. The right  ventricular size is moderately enlarged. Mildly increased right  ventricular wall thickness. There is moderately elevated pulmonary artery  systolic pressure.  3. Right atrial size was severely dilated.  4. The mitral valve is normal in structure. Trivial mitral valve  regurgitation.  5. Tricuspid valve regurgitation is moderate to severe.  6. The aortic valve is abnormal. Aortic valve regurgitation is trivial.  Mild to moderate aortic valve sclerosis/calcification is present, without  any evidence of aortic stenosis.  7. The inferior vena cava is dilated in size with <50% respiratory  variability, suggesting right atrial pressure of 15 mmHg.   Patient Profile     86 y.o. male with past  medical history of CAD (s/p BMS to LAD in 2014, repeat cath in 10/2016 showing patent stent and 80% distal LCx stenosis not suitable for PCI and medical management recommended),ILD (on 4L [place] at baseline),chronic diastolic CHF,pulmonary HTN, moderate to severe TR,acute PE (diagnosed in 05/2018- no longer on anticoagulation),HTN, HLD,GERDand Stage 3 CKDB, PUD, and prior history of pulmonary bleeding admitted on 04/12/2020 with NSTEMI  Assessment & Plan    NSTEMI:  The plan was for medical management with 48 hours of IV heparin.  Continue another 24 hours.  On IV NTG.  I would discontinue this and start Imdur.  I think pain management is difficult because of his confusion and difficulty articulating.  However, he is clearly not in distress and medical management is our best option.    AKI:  Creat was 3.78 on presentation with previous baseline 1.44.    Creat has improved slightly.  Avoiding diuresis and all nephrotoxins.    CAD:    Medication management.  No plans for repeat cardiac cath.    CHRONIC RESPIRATORY FAILURE:   He is on 4 liters [place] at baseline at home.  No distress.     CHRONIC DIASTOLIC HF:  Diffuse crackles on exam are likely chronic.  BNP was only mildly increased and not significantly different than previous values over the years.  No indication of acute volume overload.     For questions or updates, please contact [place] Please consult [email or web address] for contact info under Cardiology/STEMI.   Signed, Minus Breeding, MD  04/14/2020, 7:56 AM

## 2020-04-14 NOTE — Plan of Care (Signed)

## 2020-04-14 NOTE — Progress Notes (Signed)
Initial Nutrition Assessment  RD working remotely.  DOCUMENTATION CODES:   Not applicable  INTERVENTION:   - Ensure Enlive po BID, each supplement provides 350 kcal and 20 grams of protein  - MVI with minerals daily  NUTRITION DIAGNOSIS:   Increased nutrient needs related to chronic illness (interstitial lung disease, CHF) as evidenced by estimated needs.  GOAL:   Patient will meet greater than or equal to 90% of their needs  MONITOR:   PO intake,Supplement acceptance,Weight trends,I & O's  REASON FOR ASSESSMENT:   Malnutrition Screening Tool    ASSESSMENT:   86 year old male who presented to the ED on 12/27 with chest pain. PMH of CAD, bronchiectasis, interstitial lung disease on home oxygen, PUD, GERD, H. pylori gastritis, HLD, CHF, pulmonary HTN, pulmonary embolism, CKD. Pt admitted with NSTEMI.   Attempted to speak with pt via phone call to room; however, no answer. Will attempt to obtain diet and weight history at follow-up.  Reviewed available weight history in chart. Pt with a weight loss of 9.1 kg since 10/01/19. This is a 12.5% weight loss in 6 months which is significant for timeframe. It is difficult to determine how much of weight loss can be attributed to fluid shifts given CHF diagnosis. However, pt is at risk for malnutrition and may meet criteria at this time but RD unable to confirm without diet history or NFPE.  One meal completion of 50% for breakfast recorded today. RD will order oral nutrition supplements to aid pt in meeting kcal and protein needs. Will also order daily MVI with minerals.  Medications reviewed and include: protonix, heparin gtt, nitroglycerin gtt  Labs reviewed: BUN 54, creatinine 3.34  UOP: 100 ml x 12 hours per I/O's flowsheet I/O's: +764 ml since admit  NUTRITION - FOCUSED PHYSICAL EXAM:  Unable to complete at this time. RD working remotely.  Diet Order:   Diet Order            Diet Heart Room service appropriate? Yes;  Fluid consistency: Thin  Diet effective now                 EDUCATION NEEDS:   No education needs have been identified at this time  Skin:  Skin Assessment: Reviewed RN Assessment  Last BM:  04/14/20 medium type 5  Height:   Ht Readings from Last 1 Encounters:  04/13/20 5\' 4"  (1.626 m)    Weight:   Wt Readings from Last 1 Encounters:  04/13/20 63.5 kg    Ideal Body Weight:  59.1 kg  BMI:  Body mass index is 24.03 kg/m.  Estimated Nutritional Needs:   Kcal:  1600-1800  Protein:  75-90 grams  Fluid:  1.6-1.8 L    Anthony Bryant, MS, RD, LDN Inpatient Clinical Dietitian Please see AMi[facility] for contact information.

## 2020-04-15 DIAGNOSIS — I249 Acute ischemic heart disease, unspecified: Secondary | ICD-10-CM | POA: Diagnosis not present

## 2020-04-15 LAB — BASIC METABOLIC PANEL
Anion gap: 9 (ref 5–15)
BUN: 49 mg/dL — ABNORMAL HIGH (ref 8–23)
CO2: 24 mmol/L (ref 22–32)
Calcium: 8.3 mg/dL — ABNORMAL LOW (ref 8.9–10.3)
Chloride: 104 mmol/L (ref 98–111)
Creatinine, Ser: 2.86 mg/dL — ABNORMAL HIGH (ref 0.61–1.24)
GFR, Estimated: 20 mL/min — ABNORMAL LOW (ref >60)
Glucose, Bld: 108 mg/dL — ABNORMAL HIGH (ref 70–99)
Potassium: 4.2 mmol/L (ref 3.5–5.1)
Sodium: 137 mmol/L (ref 135–145)

## 2020-04-15 LAB — CBC
HCT: 32.7 % — ABNORMAL LOW (ref 39.0–52.0)
Hemoglobin: 10.2 g/dL — ABNORMAL LOW (ref 13.0–17.0)
MCH: 29.1 pg (ref 26.0–34.0)
MCHC: 31.2 g/dL (ref 30.0–36.0)
MCV: 93.4 fL (ref 80.0–100.0)
Platelets: 136 10*3/uL — ABNORMAL LOW (ref 150–400)
RBC: 3.5 MIL/uL — ABNORMAL LOW (ref 4.22–5.81)
RDW: 16.2 % — ABNORMAL HIGH (ref 11.5–15.5)
WBC: 7.5 10*3/uL (ref 4.0–10.5)
nRBC: 0 % (ref 0.0–0.2)

## 2020-04-15 MED ORDER — ISOSORBIDE MONONITRATE ER 60 MG PO TB24
60.0000 mg | ORAL_TABLET | Freq: Every day | ORAL | Status: DC
Start: 2020-04-15 — End: 2020-04-20
  Administered 2020-04-15 – 2020-04-19 (×5): 60 mg via ORAL
  Filled 2020-04-15 (×5): qty 1

## 2020-04-15 MED ORDER — PANTOPRAZOLE SODIUM 40 MG PO TBEC
40.0000 mg | DELAYED_RELEASE_TABLET | Freq: Two times a day (BID) | ORAL | Status: DC
  Administered 2020-04-15 – 2020-04-19 (×9): 40 mg via ORAL
  Filled 2020-04-15 (×8): qty 1

## 2020-04-15 MED ORDER — ALUM & MAG HYDROXIDE-SIMETH 200-200-20 MG/5ML PO SUSP
15.0000 mL | ORAL | Status: DC | PRN
  Administered 2020-04-15 – 2020-04-16 (×2): 15 mL via ORAL
  Filled 2020-04-15 (×2): qty 30

## 2020-04-15 MED ORDER — ALUM & MAG HYDROXIDE-SIMETH 200-200-20 MG/5ML PO SUSP
30.0000 mL | Freq: Once | ORAL | Status: AC
  Administered 2020-04-15: 12:00:00 30 mL via ORAL
  Filled 2020-04-15: qty 30

## 2020-04-15 MED ORDER — LIDOCAINE VISCOUS HCL 2 % MT SOLN
15.0000 mL | Freq: Once | OROMUCOSAL | Status: AC
  Administered 2020-04-15: 13:00:00 15 mL via ORAL
  Filled 2020-04-15: qty 15

## 2020-04-15 MED ORDER — HEPARIN SODIUM (PORCINE) 5000 UNIT/ML IJ SOLN
5000.0000 [IU] | Freq: Three times a day (TID) | INTRAMUSCULAR | Status: DC
  Administered 2020-04-15 – 2020-04-18 (×10): 5000 [IU] via SUBCUTANEOUS
  Filled 2020-04-15 (×10): qty 1

## 2020-04-15 NOTE — Progress Notes (Signed)
Progress Note    Anthony Galvan  EZM:[id number] DOB: 10-15-1921  DOA: 04/12/2020 PCP: Iona Beard, MD    Brief Narrative:     Medical records reviewed and are as summarized below:  Anthony Galvan is an 86 y.o. male with past medical history of coronary artery disease, interstitial lung disease on 4 L nasal cannula, moderate to severe tricuspid regurg, who was admitted on 12/27 with non-STEMI.  He has chronic kidney disease and therefore cannot have left heart cath so cardiology is recommending medical management.  Assessment/Plan:   Principal Problem:   Acute coronary syndrome with high troponin (HCC) Active Problems:   Hyperlipidemia with target low density lipoprotein (LDL) cholesterol less than 70 mg/dL   Essential hypertension   GERD   CAD S/P percutaneous coronary angioplasty   PUD (peptic ulcer disease)   Normocytic anemia   Hyperbilirubinemia   CKD (chronic kidney disease), stage V (HCC)   NSTEMI (non-ST elevated myocardial infarction) ([place])    NSTEMI--renal function precludes LHC cardiologist recommends medical management Troponin 271>>248 -EKG without acute findings -s/o IV Heparin -Echo on 04/13/2020 with EF of 65 to 50%, grade 1 diastolic dysfunction, no significant regional wall motion normalities -change nitro to imdur -Continue Lopressor 12.5 mg twice daily, aspirin 81 mg daily and Lipitor 80 mg daily -echo:EF of 65 to 35%, grade 1 diastolic dysfunction, no significant regional wall motion normalities -trial of GI cocktail  AKI----acute kidney injury on CKD stage - 3B   -Baseline creatinine usually around 1.4, creatinine on admission up to 3.78 --renally adjust medications, avoid nephrotoxic agents / dehydration  / hypotension -trending down -daily labs  CAD-- -  -- s/p BMS to LAD in 2014withrepeat cath in 10/2016 showing patent stent and 80% distal LCx stenosis not suitable for PCI and medical management recommended  chronic hypoxic  respiratory failure due to interstitial lung disease - oxygen requirement appears to be at baseline of 4 L/min at this time  HFpEF/Pulm HTN/tricuspid regurg--- repeat -Echo on 04/13/2020 with EF of 65 to 46%, grade 1 diastolic dysfunction, no significant regional wall motion normalities -Lasix on hold due to AKI -Pulmonary HTN most likely due to underlying lung disease/ILD -Previously evaluated by cardiology service and deemed not to be a candidate for advanced therapies for heart failure management to help with hypertension management  GERD/PUD --continue Protonix 40 mg BID  Family Communication/Anticipated D/C date and plan/Code Status   DVT prophylaxis: Heparin  Code Status: Full Code.  Disposition Plan: Status is: Inpatient Spoke with daughter on phone: she states his doctors at the [place] want to transfer him over- notified [facility] Remains inpatient appropriate because:Inpatient level of care appropriate due to severity of illness   Dispo: The patient is from: Home              Anticipated d/c is to: Home              Anticipated d/c date is: > 3 days              Patient currently is not medically stable to d/c.         Medical Consultants:    Cardiology  Subjective:   C/o being cold and not having a good morning  Objective:    Vitals:   04/14/20 2348 04/15/20 0719 04/15/20 0800 04/15/20 0947  BP:   99/83 116/62  Pulse:  64 75 71  Resp:  17 16   Temp: 98 F (36.7 C)  98  F (36.7 C)   TempSrc: Oral  Oral   SpO2:  100% 94%   Weight:      Height:        Intake/Output Summary (Last 24 hours) at 04/15/2020 1047 Last data filed at 04/15/2020 1014 Gross per 24 hour  Intake 437.75 ml  Output 475 ml  Net -37.25 ml   Filed Weights   04/12/20 1324 04/13/20 2146  Weight: 68 kg 63.5 kg    Exam:   General: Appearance:    elderly male in no acute distress     Lungs:      respirations unlabored, crackles through out but no wheezing  Heart:    Normal heart  rate.   MS:   All extremities are intact.   Neurologic:   Awake, alert, pleasant and cooperative                       Data Reviewed:   I have personally reviewed following labs and imaging studies:  Labs: Labs show the following:   Basic Metabolic Panel: Recent Labs  Lab 04/12/20 1722 04/13/20 0854 04/14/20 1457 04/15/20 0030  NA 136 137 137 137  K 4.1 4.3 4.5 4.2  CL 101 103 102 104  CO2 21* 22 24 24   GLUCOSE 83 66* 95 108*  BUN 58* 54* 47* 49*  CREATININE 3.78* 3.34* 2.97* 2.86*  CALCIUM 8.8* 8.5* 8.6* 8.3*   GFR Estimated Creatinine Clearance: 12.6 mL/min (A) (by C-G formula based on SCr of 2.86 mg/dL (H)). Liver Function Tests: Recent Labs  Lab 04/12/20 1722  AST 39  ALT 23  ALKPHOS 67  BILITOT 2.0*  PROT 6.8  ALBUMIN 3.5   No results for input(s): LIPASE, AMYLASE in the last 168 hours. No results for input(s): AMMONIA in the last 168 hours. Coagulation profile No results for input(s): INR, PROTIME in the last 168 hours.  CBC: Recent Labs  Lab 04/12/20 1234 04/13/20 0311 04/14/20 0105 04/15/20 0030  WBC 9.8 8.2 9.2 7.5  NEUTROABS 6.2  --   --   --   HGB 12.3* 11.0* 10.4* 10.2*  HCT 39.7 36.6* 33.6* 32.7*  MCV 96.1 98.9 94.6 93.4  PLT 178 126* 143* 136*   Cardiac Enzymes: No results for input(s): CKTOTAL, CKMB, CKMBINDEX, TROPONINI in the last 168 hours. BNP (last 3 results) No results for input(s): PROBNP in the last 8760 hours. CBG: No results for input(s): GLUCAP in the last 168 hours. D-Dimer: No results for input(s): DDIMER in the last 72 hours. Hgb A1c: No results for input(s): HGBA1C in the last 72 hours. Lipid Profile: No results for input(s): CHOL, HDL, LDLCALC, TRIG, CHOLHDL, LDLDIRECT in the last 72 hours. Thyroid function studies: No results for input(s): TSH, T4TOTAL, T3FREE, THYROIDAB in the last 72 hours.  Invalid input(s): FREET3 Anemia work up: No results for input(s): VITAMINB12, FOLATE, FERRITIN, TIBC, IRON,  RETICCTPCT in the last 72 hours. Sepsis Labs: Recent Labs  Lab 04/12/20 1234 04/13/20 0311 04/14/20 0105 04/15/20 0030  WBC 9.8 8.2 9.2 7.5    Microbiology Recent Results (from the past 240 hour(s))  Resp Panel by RT-PCR (Flu A&B, Covid) Nasopharyngeal Swab     Status: None   Collection Time: 04/12/20  3:27 PM   Specimen: Nasopharyngeal Swab; Nasopharyngeal(NP) swabs in vial transport medium  Result Value Ref Range Status   SARS Coronavirus 2 by RT PCR NEGATIVE NEGATIVE Final    Comment: (NOTE) SARS-CoV-2 target nucleic acids are NOT DETECTED.  The SARS-CoV-2 RNA is generally detectable in upper respiratory specimens during the acute phase of infection. The lowest concentration of SARS-CoV-2 viral copies this assay can detect is 138 copies/mL. A negative result does not preclude SARS-Cov-2 infection and should not be used as the sole basis for treatment or other patient management decisions. A negative result may occur with  improper specimen collection/handling, submission of specimen other than nasopharyngeal swab, presence of viral mutation(s) within the areas targeted by this assay, and inadequate number of viral copies(<138 copies/mL). A negative result must be combined with clinical observations, patient history, and epidemiological information. The expected result is Negative.  Fact Sheet for Patients:  EntrepreneurPulse.com.au  Fact Sheet for Healthcare Providers:  IncredibleEmployment.be  This test is no t yet approved or cleared by the [place] FDA and  has been authorized for detection and/or diagnosis of SARS-CoV-2 by FDA under an Emergency Use Authorization (EUA). This EUA will remain  in effect (meaning this test can be used) for the duration of the COVID-19 declaration under Section 564(b)(1) of the Act, 21 U.S.C.section 360bbb-3(b)(1), unless the authorization is terminated  or revoked sooner.       Influenza A  by PCR NEGATIVE NEGATIVE Final   Influenza B by PCR NEGATIVE NEGATIVE Final    Comment: (NOTE) The Xpert Xpress SARS-CoV-2/FLU/RSV plus assay is intended as an aid in the diagnosis of influenza from Nasopharyngeal swab specimens and should not be used as a sole basis for treatment. Nasal washings and aspirates are unacceptable for Xpert Xpress SARS-CoV-2/FLU/RSV testing.  Fact Sheet for Patients: EntrepreneurPulse.com.au  Fact Sheet for Healthcare Providers: IncredibleEmployment.be  This test is not yet approved or cleared by the [place] FDA and has been authorized for detection and/or diagnosis of SARS-CoV-2 by FDA under an Emergency Use Authorization (EUA). This EUA will remain in effect (meaning this test can be used) for the duration of the COVID-19 declaration under Section 564(b)(1) of the Act, 21 U.S.C. section 360bbb-3(b)(1), unless the authorization is terminated or revoked.  Performed at [facility], [address], [place], [place] [address]   MRSA PCR Screening     Status: None   Collection Time: 04/13/20 12:43 AM   Specimen: Nasal Mucosa; Nasopharyngeal  Result Value Ref Range Status   MRSA by PCR NEGATIVE NEGATIVE Final    Comment:        The GeneXpert MRSA Assay (FDA approved for NASAL specimens only), is one component of a comprehensive MRSA colonization surveillance program. It is not intended to diagnose MRSA infection nor to guide or monitor treatment for MRSA infections. Performed at [facility], [place] [address], [place], [place] [address]     Procedures and diagnostic studies:  ECHOCARDIOGRAM COMPLETE  Result Date: 04/13/2020    ECHOCARDIOGRAM REPORT   Patient Name:   Anthony Galvan Date of Exam: 04/13/2020 Medical Rec #:  [id number]      Height:       64.0 in Accession #:    [id number]     Weight:       150.0 lb Date of Birth:  02-16-1922     BSA:          1.731 m Patient Age:    20 years       BP:            121/51 mmHg Patient Gender: M              HR:  63 bpm. Exam Location:  [facility] Procedure: 2D Echo, Cardiac Doppler and Color Doppler Indications:    NSTEMI I21.4  History:        Patient has prior history of Echocardiogram examinations, most                 recent 01/25/2019. CHF, Previous Myocardial Infarction and CAD;                 Risk Factors:Hypertension, Dyslipidemia and Non-Smoker. S/P                 percutaneous coronary angioplasty.  Sonographer:    Alvino Chapel RCS Referring Phys: [id number] [place]  1. Left ventricular ejection fraction, by estimation, is 65 to 70%. The left ventricle has normal function. The left ventricle has no regional wall motion abnormalities. There is mild left ventricular hypertrophy. Left ventricular diastolic parameters are consistent with Grade I diastolic dysfunction (impaired relaxation).  2. Right ventricular systolic function is low normal. The right ventricular size is moderately enlarged. Mildly increased right ventricular wall thickness. There is moderately elevated pulmonary artery systolic pressure.  3. Right atrial size was severely dilated.  4. The mitral valve is normal in structure. Trivial mitral valve regurgitation.  5. Tricuspid valve regurgitation is moderate to severe.  6. The aortic valve is abnormal. Aortic valve regurgitation is trivial. Mild to moderate aortic valve sclerosis/calcification is present, without any evidence of aortic stenosis.  7. The inferior vena cava is dilated in size with <50% respiratory variability, suggesting right atrial pressure of 15 mmHg. Comparison(s): No significant change from prior study. FINDINGS  Left Ventricle: Left ventricular ejection fraction, by estimation, is 65 to 70%. The left ventricle has normal function. The left ventricle has no regional wall motion abnormalities. The left ventricular internal cavity size was normal in size. There is  mild left ventricular  hypertrophy. Left ventricular diastolic parameters are consistent with Grade I diastolic dysfunction (impaired relaxation). Right Ventricle: The right ventricular size is moderately enlarged. Mildly increased right ventricular wall thickness. Right ventricular systolic function is low normal. There is moderately elevated pulmonary artery systolic pressure. The tricuspid regurgitant velocity is 3.08 m/s, and with an assumed right atrial pressure of 15 mmHg, the estimated right ventricular systolic pressure is 72.0 mmHg. Left Atrium: Left atrial size was normal in size. Right Atrium: Right atrial size was severely dilated. Pericardium: Trivial pericardial effusion is present. Mitral Valve: The mitral valve is normal in structure. Trivial mitral valve regurgitation. Tricuspid Valve: The tricuspid valve is grossly normal. Tricuspid valve regurgitation is moderate to severe. Aortic Valve: The aortic valve is abnormal. Aortic valve regurgitation is trivial. Mild to moderate aortic valve sclerosis/calcification is present, without any evidence of aortic stenosis. Pulmonic Valve: The pulmonic valve was grossly normal. Pulmonic valve regurgitation is not visualized. Aorta: The aortic root is normal in size and structure. Venous: The inferior vena cava is dilated in size with less than 50% respiratory variability, suggesting right atrial pressure of 15 mmHg. IAS/Shunts: The interatrial septum was not assessed.  LEFT VENTRICLE PLAX 2D LVIDd:         3.60 cm  Diastology LVIDs:         2.40 cm  LV e' medial:    5.55 cm/s LV PW:         1.40 cm  LV E/e' medial:  13.1 LV IVS:        1.40 cm  LV e' lateral:   6.96 cm/s LVOT  diam:     2.00 cm  LV E/e' lateral: 10.4 LV SV:         76 LV SV Index:   44 LVOT Area:     3.14 cm  RIGHT VENTRICLE RV S prime:     10.20 cm/s TAPSE (M-mode): 2.2 cm LEFT ATRIUM           Index       RIGHT ATRIUM           Index LA diam:      3.40 cm 1.96 cm/m  RA Area:     25.20 cm LA Vol (A2C): 41.6 ml  24.00 ml/m RA Volume:   78.60 ml  45.40 ml/m LA Vol (A4C): 48.6 ml 28.07 ml/m  AORTIC VALVE LVOT Vmax:   77.30 cm/s LVOT Vmean:  48.900 cm/s LVOT VTI:    0.242 m  AORTA Ao Root diam: 3.10 cm MITRAL VALVE               TRICUSPID VALVE MV Area (PHT): 3.40 cm    TR Peak grad:   37.9 mmHg MV Decel Time: 223 msec    TR Vmax:        308.00 cm/s MV E velocity: 72.70 cm/s MV A velocity: 72.30 cm/s  SHUNTS MV E/A ratio:  1.01        Systemic VTI:  0.24 m                            Systemic Diam: 2.00 cm Dorris Carnes MD Electronically signed by Dorris Carnes MD Signature Date/Time: 04/13/2020/4:35:46 PM    Final     Medications:    aspirin EC  81 mg Oral Daily   atorvastatin  80 mg Oral Daily   feeding supplement  237 mL Oral BID BM   heparin  5,000 Units Subcutaneous Q8H   ipratropium-albuterol  3 mL Nebulization TID   isosorbide mononitrate  60 mg Oral Daily   mouth rinse  15 mL Mouth Rinse BID   metoprolol tartrate  12.5 mg Oral BID   multivitamin with minerals  1 tablet Oral Daily   pantoprazole  40 mg Oral Daily   Continuous Infusions:    LOS: 2 days   Geradine Girt  [facility]   How to contact the [facility] Attending or Consulting provider [place] or covering provider during after hours 7P -7A, for this patient?  1. Check the care team in [facility] and look for a) attending/consulting [facility] provider listed and b) the [facility] team listed 2. Log into [email or web address] and use [facility]'s universal password to access. If you do not have the password, please contact the hospital operator. 3. Locate the [facility] provider you are looking for under [facility] and page to a number that you can be directly reached. 4. If you still have difficulty reaching the provider, please page the [facility] (Director on Call) for the Hospitalists listed on amion for assistance.  04/15/2020, 10:47 AM

## 2020-04-15 NOTE — Plan of Care (Signed)

## 2020-04-15 NOTE — Progress Notes (Signed)
Progress Note  Patient Name: Anthony Galvan Date of Encounter: 04/15/2020  Primary Cardiologist:   Rozann Lesches, MD   Subjective   He says that he is having no chest pain today.    He has a mild cough but denies SOB.  He is on his baseline 4 liters [place].   Inpatient Medications    Scheduled Meds: . aspirin EC  81 mg Oral Daily  . atorvastatin  80 mg Oral Daily  . feeding supplement  237 mL Oral BID BM  . ipratropium-albuterol  3 mL Nebulization TID  . isosorbide dinitrate  20 mg Oral TID  . mouth rinse  15 mL Mouth Rinse BID  . metoprolol tartrate  12.5 mg Oral BID  . multivitamin with minerals  1 tablet Oral Daily  . pantoprazole  40 mg Oral Daily   Continuous Infusions:  PRN Meds: acetaminophen **OR** acetaminophen, guaiFENesin-dextromethorphan, ondansetron **OR** ondansetron (ZOFRAN) IV   Vital Signs    Vitals:   04/14/20 2107 04/14/20 2116 04/14/20 2348 04/15/20 0719  BP:  (!) 108/56    Pulse:  72  64  Resp:    17  Temp:   98 F (36.7 C)   TempSrc:   Oral   SpO2: 99%   100%  Weight:      Height:        Intake/Output Summary (Last 24 hours) at 04/15/2020 0747 Last data filed at 04/14/2020 2300 Gross per 24 hour  Intake 521.04 ml  Output 350 ml  Net 171.04 ml   Filed Weights   04/12/20 1324 04/13/20 2146  Weight: 68 kg 63.5 kg    Telemetry    NSR- Personally Reviewed  ECG    NA - Personally Reviewed  Physical Exam   GEN: No  acute distress.   Neck: No  JVD Cardiac: RRR, no murmurs, rubs, or gallops.  Respiratory:     Diffuse dry crackles over entire lung fields.  GI: Soft, nontender, non-distended, normal bowel sounds  MS:  Mild right greater than left leg edema; No deformity. Neuro:   Nonfocal  Psych: Oriented and appropriate    Labs    Chemistry Recent Labs  Lab 04/12/20 1722 04/13/20 0854 04/14/20 1457 04/15/20 0030  NA 136 137 137 137  K 4.1 4.3 4.5 4.2  CL 101 103 102 104  CO2 21* 22 24 24   GLUCOSE 83 66* 95 108*   BUN 58* 54* 47* 49*  CREATININE 3.78* 3.34* 2.97* 2.86*  CALCIUM 8.8* 8.5* 8.6* 8.3*  PROT 6.8  --   --   --   ALBUMIN 3.5  --   --   --   AST 39  --   --   --   ALT 23  --   --   --   ALKPHOS 67  --   --   --   BILITOT 2.0*  --   --   --   GFRNONAA 14* 16* 19* 20*  ANIONGAP 14 12 11 9      Hematology Recent Labs  Lab 04/13/20 0311 04/14/20 0105 04/15/20 0030  WBC 8.2 9.2 7.5  RBC 3.70* 3.55* 3.50*  HGB 11.0* 10.4* 10.2*  HCT 36.6* 33.6* 32.7*  MCV 98.9 94.6 93.4  MCH 29.7 29.3 29.1  MCHC 30.1 31.0 31.2  RDW 16.5* 16.2* 16.2*  PLT 126* 143* 136*    Cardiac EnzymesNo results for input(s): TROPONINI in the last 168 hours. No results for input(s): TROPIPOC in the last 168  hours.   BNP Recent Labs  Lab 04/12/20 1722  BNP 437.0*     DDimer No results for input(s): DDIMER in the last 168 hours.   Radiology    ECHOCARDIOGRAM COMPLETE  Result Date: 04/13/2020    ECHOCARDIOGRAM REPORT   Patient Name:   Anthony Galvan Date of Exam: 04/13/2020 Medical Rec #:  [id number]      Height:       64.0 in Accession #:    [id number]     Weight:       150.0 lb Date of Birth:  1921/09/22     BSA:          1.731 m Patient Age:    86 years       BP:           121/51 mmHg Patient Gender: M              HR:           63 bpm. Exam Location:  [facility] Procedure: 2D Echo, Cardiac Doppler and Color Doppler Indications:    NSTEMI I21.4  History:        Patient has prior history of Echocardiogram examinations, most                 recent 01/25/2019. CHF, Previous Myocardial Infarction and CAD;                 Risk Factors:Hypertension, Dyslipidemia and Non-Smoker. S/P                 percutaneous coronary angioplasty.  Sonographer:    Alvino Chapel RCS Referring Phys: [id number] [place]  1. Left ventricular ejection fraction, by estimation, is 65 to 70%. The left ventricle has normal function. The left ventricle has no regional wall motion abnormalities. There is mild left  ventricular hypertrophy. Left ventricular diastolic parameters are consistent with Grade I diastolic dysfunction (impaired relaxation).  2. Right ventricular systolic function is low normal. The right ventricular size is moderately enlarged. Mildly increased right ventricular wall thickness. There is moderately elevated pulmonary artery systolic pressure.  3. Right atrial size was severely dilated.  4. The mitral valve is normal in structure. Trivial mitral valve regurgitation.  5. Tricuspid valve regurgitation is moderate to severe.  6. The aortic valve is abnormal. Aortic valve regurgitation is trivial. Mild to moderate aortic valve sclerosis/calcification is present, without any evidence of aortic stenosis.  7. The inferior vena cava is dilated in size with <50% respiratory variability, suggesting right atrial pressure of 15 mmHg. Comparison(s): No significant change from prior study. FINDINGS  Left Ventricle: Left ventricular ejection fraction, by estimation, is 65 to 70%. The left ventricle has normal function. The left ventricle has no regional wall motion abnormalities. The left ventricular internal cavity size was normal in size. There is  mild left ventricular hypertrophy. Left ventricular diastolic parameters are consistent with Grade I diastolic dysfunction (impaired relaxation). Right Ventricle: The right ventricular size is moderately enlarged. Mildly increased right ventricular wall thickness. Right ventricular systolic function is low normal. There is moderately elevated pulmonary artery systolic pressure. The tricuspid regurgitant velocity is 3.08 m/s, and with an assumed right atrial pressure of 15 mmHg, the estimated right ventricular systolic pressure is 38.1 mmHg. Left Atrium: Left atrial size was normal in size. Right Atrium: Right atrial size was severely dilated. Pericardium: Trivial pericardial effusion is present. Mitral Valve: The mitral valve is normal in structure. Trivial mitral valve  regurgitation. Tricuspid Valve: The tricuspid valve is grossly normal. Tricuspid valve regurgitation is moderate to severe. Aortic Valve: The aortic valve is abnormal. Aortic valve regurgitation is trivial. Mild to moderate aortic valve sclerosis/calcification is present, without any evidence of aortic stenosis. Pulmonic Valve: The pulmonic valve was grossly normal. Pulmonic valve regurgitation is not visualized. Aorta: The aortic root is normal in size and structure. Venous: The inferior vena cava is dilated in size with less than 50% respiratory variability, suggesting right atrial pressure of 15 mmHg. IAS/Shunts: The interatrial septum was not assessed.  LEFT VENTRICLE PLAX 2D LVIDd:         3.60 cm  Diastology LVIDs:         2.40 cm  LV e' medial:    5.55 cm/s LV PW:         1.40 cm  LV E/e' medial:  13.1 LV IVS:        1.40 cm  LV e' lateral:   6.96 cm/s LVOT diam:     2.00 cm  LV E/e' lateral: 10.4 LV SV:         76 LV SV Index:   44 LVOT Area:     3.14 cm  RIGHT VENTRICLE RV S prime:     10.20 cm/s TAPSE (M-mode): 2.2 cm LEFT ATRIUM           Index       RIGHT ATRIUM           Index LA diam:      3.40 cm 1.96 cm/m  RA Area:     25.20 cm LA Vol (A2C): 41.6 ml 24.00 ml/m RA Volume:   78.60 ml  45.40 ml/m LA Vol (A4C): 48.6 ml 28.07 ml/m  AORTIC VALVE LVOT Vmax:   77.30 cm/s LVOT Vmean:  48.900 cm/s LVOT VTI:    0.242 m  AORTA Ao Root diam: 3.10 cm MITRAL VALVE               TRICUSPID VALVE MV Area (PHT): 3.40 cm    TR Peak grad:   37.9 mmHg MV Decel Time: 223 msec    TR Vmax:        308.00 cm/s MV E velocity: 72.70 cm/s MV A velocity: 72.30 cm/s  SHUNTS MV E/A ratio:  1.01        Systemic VTI:  0.24 m                            Systemic Diam: 2.00 cm Dorris Carnes MD Electronically signed by Dorris Carnes MD Signature Date/Time: 04/13/2020/4:35:46 PM    Final     Cardiac Studies   Echo  1. Left ventricular ejection fraction, by estimation, is 65 to 70%. The  left ventricle has normal function. The left  ventricle has no regional  wall motion abnormalities. There is mild left ventricular hypertrophy.  Left ventricular diastolic parameters  are consistent with Grade I diastolic dysfunction (impaired relaxation).  2. Right ventricular systolic function is low normal. The right  ventricular size is moderately enlarged. Mildly increased right  ventricular wall thickness. There is moderately elevated pulmonary artery  systolic pressure.  3. Right atrial size was severely dilated.  4. The mitral valve is normal in structure. Trivial mitral valve  regurgitation.  5. Tricuspid valve regurgitation is moderate to severe.  6. The aortic valve is abnormal. Aortic valve regurgitation is trivial.  Mild to moderate aortic valve sclerosis/calcification is  present, without  any evidence of aortic stenosis.  7. The inferior vena cava is dilated in size with <50% respiratory  variability, suggesting right atrial pressure of 15 mmHg.   Patient Profile     86 y.o. male with past medical history of CAD (s/p BMS to LAD in 2014, repeat cath in 10/2016 showing patent stent and 80% distal LCx stenosis not suitable for PCI and medical management recommended),ILD (on 4L [place] at baseline),chronic diastolic CHF,pulmonary HTN, moderate to severe TR,acute PE (diagnosed in 05/2018- no longer on anticoagulation),HTN, HLD,GERDand Stage 3 CKDB, PUD, and prior history of pulmonary bleeding admitted on 04/12/2020 with NSTEMI  Assessment & Plan    NSTEMI:   Heparin off after 24 hours.  On PO isosorbid and I will change this to Imdur.  AKI:  Creat was 3.78 on presentation with previous baseline 1.44.    Trending down slowly.  Avoiding nephrotoxins and diuresis.   CAD:    Medication management.  No plans for repeat cardiac cath.    CHRONIC RESPIRATORY FAILURE:   He is on 4 liters [place] at baseline at home.      CHRONIC DIASTOLIC HF:  Diffuse crackles on exam are likely chronic.  He is plus 1 liter this admission.   However, I/O are not necessarily complete.  He is oxygenating at baseline so I would again avoid diuresis today as his kidneys slowly recover.    For questions or updates, please contact [place] Please consult [email or web address] for contact info under Cardiology/STEMI.   Signed, Minus Breeding, MD  04/15/2020, 7:47 AM

## 2020-04-16 DIAGNOSIS — N179 Acute kidney failure, unspecified: Secondary | ICD-10-CM | POA: Diagnosis not present

## 2020-04-16 DIAGNOSIS — I249 Acute ischemic heart disease, unspecified: Secondary | ICD-10-CM | POA: Diagnosis not present

## 2020-04-16 DIAGNOSIS — I5032 Chronic diastolic (congestive) heart failure: Secondary | ICD-10-CM | POA: Diagnosis not present

## 2020-04-16 DIAGNOSIS — I214 Non-ST elevation (NSTEMI) myocardial infarction: Principal | ICD-10-CM

## 2020-04-16 LAB — BASIC METABOLIC PANEL
Anion gap: 10 (ref 5–15)
BUN: 48 mg/dL — ABNORMAL HIGH (ref 8–23)
CO2: 24 mmol/L (ref 22–32)
Calcium: 8.7 mg/dL — ABNORMAL LOW (ref 8.9–10.3)
Chloride: 106 mmol/L (ref 98–111)
Creatinine, Ser: 2.56 mg/dL — ABNORMAL HIGH (ref 0.61–1.24)
GFR, Estimated: 22 mL/min — ABNORMAL LOW (ref >60)
Glucose, Bld: 120 mg/dL — ABNORMAL HIGH (ref 70–99)
Potassium: 4.4 mmol/L (ref 3.5–5.1)
Sodium: 140 mmol/L (ref 135–145)

## 2020-04-16 LAB — CBC
HCT: 31.9 % — ABNORMAL LOW (ref 39.0–52.0)
Hemoglobin: 10.4 g/dL — ABNORMAL LOW (ref 13.0–17.0)
MCH: 30.5 pg (ref 26.0–34.0)
MCHC: 32.6 g/dL (ref 30.0–36.0)
MCV: 93.5 fL (ref 80.0–100.0)
Platelets: 119 10*3/uL — ABNORMAL LOW (ref 150–400)
RBC: 3.41 MIL/uL — ABNORMAL LOW (ref 4.22–5.81)
RDW: 16.4 % — ABNORMAL HIGH (ref 11.5–15.5)
WBC: 8.2 10*3/uL (ref 4.0–10.5)
nRBC: 0 % (ref 0.0–0.2)

## 2020-04-16 LAB — GLUCOSE, CAPILLARY: Glucose-Capillary: 104 mg/dL — ABNORMAL HIGH (ref 70–99)

## 2020-04-16 MED ORDER — NYSTATIN 100000 UNIT/ML MT SUSP
5.0000 mL | Freq: Four times a day (QID) | OROMUCOSAL | Status: DC
  Administered 2020-04-16 – 2020-04-18 (×7): 500000 [IU] via ORAL
  Filled 2020-04-16 (×7): qty 5

## 2020-04-16 MED ORDER — IPRATROPIUM-ALBUTEROL 0.5-2.5 (3) MG/3ML IN SOLN
3.0000 mL | Freq: Two times a day (BID) | RESPIRATORY_TRACT | Status: DC
  Administered 2020-04-16 – 2020-04-17 (×2): 3 mL via RESPIRATORY_TRACT
  Filled 2020-04-16 (×2): qty 3

## 2020-04-16 NOTE — Progress Notes (Signed)
Progress Note    Anthony Galvan  TGY:[id number] DOB: 1922/02/05  DOA: 04/12/2020 PCP: Iona Beard, MD    Brief Narrative:     Medical records reviewed and are as summarized below:  Anthony Galvan is an 86 y.o. male with past medical history of coronary artery disease, interstitial lung disease on 4 L nasal cannula, moderate to severe tricuspid regurg, who was admitted on 12/27 with non-STEMI.  He has chronic kidney disease and therefore cannot have left heart cath so cardiology is recommending medical management.  Assessment/Plan:   Principal Problem:   Acute coronary syndrome with high troponin (HCC) Active Problems:   Hyperlipidemia with target low density lipoprotein (LDL) cholesterol less than 70 mg/dL   Essential hypertension   GERD   CAD S/P percutaneous coronary angioplasty   PUD (peptic ulcer disease)   Normocytic anemia   Hyperbilirubinemia   CKD (chronic kidney disease), stage V (HCC)   NSTEMI (non-ST elevated myocardial infarction) ([place])    NSTEMI--renal function precludes LHC cardiologist recommends medical management Troponin 271>>248 -EKG without acute findings -s/o IV Heparin -Echo on 04/13/2020 with EF of 65 to 28%, grade 1 diastolic dysfunction, no significant regional wall motion normalities -change nitro to imdur -Continue Lopressor 12.5 mg twice daily, aspirin 81 mg daily and Lipitor 80 mg daily -echo:EF of 65 to 76%, grade 1 diastolic dysfunction, no significant regional wall motion normalities -trial of GI cocktail and nystatin swish and swallow  AKI----acute kidney injury on CKD stage - 3B   -Baseline creatinine usually around 1.4, creatinine on admission up to 3.78 --renally adjust medications, avoid nephrotoxic agents / dehydration  / hypotension -trending down -daily labs  CAD-- -  -- s/p BMS to LAD in 2014withrepeat cath in 10/2016 showing patent stent and 80% distal LCx stenosis not suitable for PCI and medical management  recommended  chronic hypoxic respiratory failure due to interstitial lung disease - oxygen requirement appears to be at baseline of 4 L/min at this time  HFpEF/Pulm HTN/tricuspid regurg--- repeat -Echo on 04/13/2020 with EF of 65 to 81%, grade 1 diastolic dysfunction, no significant regional wall motion normalities -Lasix on hold due to AKI -Pulmonary HTN most likely due to underlying lung disease/ILD -Previously evaluated by cardiology service and deemed not to be a candidate for advanced therapies for heart failure management to help with hypertension management  GERD/PUD --continue Protonix 40 mg BID  PT Eval  Family Communication/Anticipated D/C date and plan/Code Status   DVT prophylaxis: Heparin  Code Status: Full Code.  Disposition Plan: Status is: Inpatient Spoke with daughter on phone: she states his doctors at the [facility] [place] want to transfer him over- notified TOC but no note seen yet Remains inpatient appropriate because:Inpatient level of care appropriate due to severity of illness   Dispo: The patient is from: Home              Anticipated d/c is to: Home              Anticipated d/c date is: > 3 days              Patient currently is not medically stable to d/c.         Medical Consultants:    Cardiology  Subjective:   No current complaints  Objective:    Vitals:   04/16/20 0003 04/16/20 0425 04/16/20 0912 04/16/20 1140  BP:  117/67 (!) 122/57 107/69  Pulse: 76 84 81 88  Resp: (!) 21 20  18  Temp:  97.8 F (36.6 C)  97.8 F (36.6 C)  TempSrc:  Oral  Oral  SpO2: 100% 98%  96%  Weight:      Height:        Intake/Output Summary (Last 24 hours) at 04/16/2020 1542 Last data filed at 04/16/2020 1437 Gross per 24 hour  Intake --  Output 350 ml  Net -350 ml   Filed Weights   04/12/20 1324 04/13/20 2146  Weight: 68 kg 63.5 kg    Exam:   General: Appearance:    elderly male in no acute distress     Lungs:    on [place], respirations unlabored   Heart:    Normal heart rate. Normal rhythm. No murmurs, rubs, or gallops.   MS:   All extremities are intact.   Neurologic:   Awake, alert, oriented x 3. No apparent focal neurological           defect.                         Data Reviewed:   I have personally reviewed following labs and imaging studies:  Labs: Labs show the following:   Basic Metabolic Panel: Recent Labs  Lab 04/12/20 1722 04/13/20 0854 04/14/20 1457 04/15/20 0030 04/16/20 0057  NA 136 137 137 137 140  K 4.1 4.3 4.5 4.2 4.4  CL 101 103 102 104 106  CO2 21* 22 24 24 24   GLUCOSE 83 66* 95 108* 120*  BUN 58* 54* 47* 49* 48*  CREATININE 3.78* 3.34* 2.97* 2.86* 2.56*  CALCIUM 8.8* 8.5* 8.6* 8.3* 8.7*   GFR Estimated Creatinine Clearance: 14.1 mL/min (A) (by C-G formula based on SCr of 2.56 mg/dL (H)). Liver Function Tests: Recent Labs  Lab 04/12/20 1722  AST 39  ALT 23  ALKPHOS 67  BILITOT 2.0*  PROT 6.8  ALBUMIN 3.5   No results for input(s): LIPASE, AMYLASE in the last 168 hours. No results for input(s): AMMONIA in the last 168 hours. Coagulation profile No results for input(s): INR, PROTIME in the last 168 hours.  CBC: Recent Labs  Lab 04/12/20 1234 04/13/20 0311 04/14/20 0105 04/15/20 0030 04/16/20 0057  WBC 9.8 8.2 9.2 7.5 8.2  NEUTROABS 6.2  --   --   --   --   HGB 12.3* 11.0* 10.4* 10.2* 10.4*  HCT 39.7 36.6* 33.6* 32.7* 31.9*  MCV 96.1 98.9 94.6 93.4 93.5  PLT 178 126* 143* 136* 119*   Cardiac Enzymes: No results for input(s): CKTOTAL, CKMB, CKMBINDEX, TROPONINI in the last 168 hours. BNP (last 3 results) No results for input(s): PROBNP in the last 8760 hours. CBG: Recent Labs  Lab 04/16/20 1145  GLUCAP 104*   D-Dimer: No results for input(s): DDIMER in the last 72 hours. Hgb A1c: No results for input(s): HGBA1C in the last 72 hours. Lipid Profile: No results for input(s): CHOL, HDL, LDLCALC, TRIG, CHOLHDL, LDLDIRECT in the last 72 hours. Thyroid function  studies: No results for input(s): TSH, T4TOTAL, T3FREE, THYROIDAB in the last 72 hours.  Invalid input(s): FREET3 Anemia work up: No results for input(s): VITAMINB12, FOLATE, FERRITIN, TIBC, IRON, RETICCTPCT in the last 72 hours. Sepsis Labs: Recent Labs  Lab 04/13/20 0311 04/14/20 0105 04/15/20 0030 04/16/20 0057  WBC 8.2 9.2 7.5 8.2    Microbiology Recent Results (from the past 240 hour(s))  Resp Panel by RT-PCR (Flu A&B, Covid) Nasopharyngeal Swab     Status: None  Collection Time: 04/12/20  3:27 PM   Specimen: Nasopharyngeal Swab; Nasopharyngeal(NP) swabs in vial transport medium  Result Value Ref Range Status   SARS Coronavirus 2 by RT PCR NEGATIVE NEGATIVE Final    Comment: (NOTE) SARS-CoV-2 target nucleic acids are NOT DETECTED.  The SARS-CoV-2 RNA is generally detectable in upper respiratory specimens during the acute phase of infection. The lowest concentration of SARS-CoV-2 viral copies this assay can detect is 138 copies/mL. A negative result does not preclude SARS-Cov-2 infection and should not be used as the sole basis for treatment or other patient management decisions. A negative result may occur with  improper specimen collection/handling, submission of specimen other than nasopharyngeal swab, presence of viral mutation(s) within the areas targeted by this assay, and inadequate number of viral copies(<138 copies/mL). A negative result must be combined with clinical observations, patient history, and epidemiological information. The expected result is Negative.  Fact Sheet for Patients:  EntrepreneurPulse.com.au  Fact Sheet for Healthcare Providers:  IncredibleEmployment.be  This test is no t yet approved or cleared by the [place] FDA and  has been authorized for detection and/or diagnosis of SARS-CoV-2 by FDA under an Emergency Use Authorization (EUA). This EUA will remain  in effect (meaning this test can be  used) for the duration of the COVID-19 declaration under Section 564(b)(1) of the Act, 21 U.S.C.section 360bbb-3(b)(1), unless the authorization is terminated  or revoked sooner.       Influenza A by PCR NEGATIVE NEGATIVE Final   Influenza B by PCR NEGATIVE NEGATIVE Final    Comment: (NOTE) The Xpert Xpress SARS-CoV-2/FLU/RSV plus assay is intended as an aid in the diagnosis of influenza from Nasopharyngeal swab specimens and should not be used as a sole basis for treatment. Nasal washings and aspirates are unacceptable for Xpert Xpress SARS-CoV-2/FLU/RSV testing.  Fact Sheet for Patients: EntrepreneurPulse.com.au  Fact Sheet for Healthcare Providers: IncredibleEmployment.be  This test is not yet approved or cleared by the [place] FDA and has been authorized for detection and/or diagnosis of SARS-CoV-2 by FDA under an Emergency Use Authorization (EUA). This EUA will remain in effect (meaning this test can be used) for the duration of the COVID-19 declaration under Section 564(b)(1) of the Act, 21 U.S.C. section 360bbb-3(b)(1), unless the authorization is terminated or revoked.  Performed at [facility], [address] [place], [place] [address]   MRSA PCR Screening     Status: None   Collection Time: 04/13/20 12:43 AM   Specimen: Nasal Mucosa; Nasopharyngeal  Result Value Ref Range Status   MRSA by PCR NEGATIVE NEGATIVE Final    Comment:        The GeneXpert MRSA Assay (FDA approved for NASAL specimens only), is one component of a comprehensive MRSA colonization surveillance program. It is not intended to diagnose MRSA infection nor to guide or monitor treatment for MRSA infections. Performed at [facility], [place] [address], [place], [place] [address]     Procedures and diagnostic studies:  No results found.  Medications:   . aspirin EC  81 mg Oral Daily  . atorvastatin  80 mg Oral Daily  . feeding supplement   237 mL Oral BID BM  . heparin  5,000 Units Subcutaneous Q8H  . ipratropium-albuterol  3 mL Nebulization BID  . isosorbide mononitrate  60 mg Oral Daily  . mouth rinse  15 mL Mouth Rinse BID  . metoprolol tartrate  12.5 mg Oral BID  . multivitamin with minerals  1 tablet Oral  Daily  . nystatin  5 mL Oral QID  . pantoprazole  40 mg Oral BID   Continuous Infusions:    LOS: 3 days   Geradine Girt  [facility]   How to contact the [facility] Attending or Consulting provider [place] or covering provider during after hours [place], for this patient?  1. Check the care team in [facility] and look for a) attending/consulting TR[facility] provider listed and b) the [facility] team listed 2. Log into [email or web address] and use [place]'s universal password to access. If you do not have the password, please contact the hospital operator. 3. Locate the [facility] provider you are looking for under [facility] and page to a number that you can be directly reached. 4. If you still have difficulty reaching the provider, please page the [facility] (Director on Call) for the Hospitalists listed on amion for assistance.  04/16/2020, 3:42 PM

## 2020-04-16 NOTE — Evaluation (Signed)
Physical Therapy Evaluation Patient Details Name: Anthony Galvan MRN: [id number] DOB: Jul 06, 1921 Today's Date: 04/16/2020   History of Present Illness  86 y.o. male with history of CAD status post BMS to LAD in 2014, last cath in 2018 showing patent stent and 80% distal LCx stenosis not suitable for PCI, on baseline 3 to 4 LO2, chronic diastolic heart failure, pulmonary hypertension, moderate to severe TR, PE in 05/2018, hypertension, hyperlipidemia, GERD, CKD who was admitted for NSTEMI.  Clinical Impression  Pt admitted with above diagnosis. Pt was able to sit EOB with min guard assist for up to 10 minutes. Pt could not stand even with max to total assist of 1 today due to fatigue and decr balance. Assisted back to bed.  Pt is wheelchair transfers only at home.  If family can provide 24 hour mod to max assist, pt should be fine to take him home.  Will follow acutely.  Pt currently with functional limitations due to the deficits listed below (see PT Problem List). Pt will benefit from skilled PT to increase their independence and safety with mobility to allow discharge to the venue listed below.      Follow Up Recommendations Home health PT;Supervision/Assistance - 24 hour (if family can provide mod to max assist for transfers)    Equipment Recommendations  None recommended by PT    Recommendations for Other Services       Precautions / Restrictions Precautions Precautions: Fall Restrictions Weight Bearing Restrictions: No      Mobility  Bed Mobility Overal bed mobility: Needs Assistance Bed Mobility: Supine to Sit;Sit to Supine     Supine to sit: Mod assist Sit to supine: Mod assist   General bed mobility comments: Assist needed for LEs to EOB and for elevation of trunk as well as use of pad to scoot hips to EOB before pt could get his balance in sitting.When pt was assisted back to supine,needed assist for trunk and for LEs and then to scoot to center of bed.     Transfers Overall transfer level: Needs assistance Equipment used: 2 person hand held assist Transfers: Sit to/from Stand Sit to Stand: Total assist;Max assist         General transfer comment: attempted to stand to side step to [facility] and pt could not stand all the way up even with max assist. Pt ended up scooting to [facility] and then assisted to supine.  Ambulation/Gait             General Gait Details: Pt does not walk at home.  Stairs            Wheelchair Mobility    Modified Rankin (Stroke Patients Only)       Balance Overall balance assessment: Needs assistance Sitting-balance support: Feet supported;Bilateral upper extremity supported Sitting balance-Leahy Scale: Poor Sitting balance - Comments: Pt was able to sit EOB once scooted out to EOB with use of pad.  Needed UEs upport   Standing balance support: Bilateral upper extremity supported;During functional activity Standing balance-Leahy Scale: Zero Standing balance comment: could not achieve upright stance with support                             Pertinent Vitals/Pain Pain Assessment: No/denies pain    Home Living Family/patient expects to be discharged to:: Private residence Living Arrangements: Children Available Help at Discharge: Family;Available 24 hours/day;Personal care attendant (daughter lives with pt) Type of Home: House  Home Access: Ramped entrance     Home Layout: One level Home Equipment: [place] - 2 wheels;Shower seat;Electric scooter;Hand held Tourist information centre manager - 4 wheels;Grab bars - tub/shower;Wheelchair - manual (4LO2 at home)      Prior Function Level of Independence: Needs assistance   Gait / Transfers Assistance Needed: electric chair only  ADL's / Homemaking Assistance Needed: needs asssist        Hand Dominance   Dominant Hand: Right    Extremity/Trunk Assessment   Upper Extremity Assessment Upper Extremity Assessment: Defer to OT evaluation     Lower Extremity Assessment Lower Extremity Assessment: Generalized weakness    Cervical / Trunk Assessment Cervical / Trunk Assessment: Kyphotic  Communication   Communication: No difficulties  Cognition Arousal/Alertness: Awake/alert Behavior During Therapy: WFL for tasks assessed/performed Overall Cognitive Status: History of cognitive impairments - at baseline                                        General Comments General comments (skin integrity, edema, etc.): 81 bpm, 100% 4LO2, 107/69    Exercises General Exercises - Lower Extremity Ankle Circles/Pumps: AROM;Both;10 reps;Seated Long Arc Quad: AROM;Both;5 reps;Seated   Assessment/Plan    PT Assessment Patient needs continued PT services  PT Problem List Decreased activity tolerance;Decreased balance;Decreased mobility;Decreased knowledge of use of DME;Decreased safety awareness;Decreased knowledge of precautions       PT Treatment Interventions DME instruction;Functional mobility training;Therapeutic activities;Therapeutic exercise;Balance training;Patient/family education    PT Goals (Current goals can be found in the Care Plan section)  Acute Rehab PT Goals Patient Stated Goal: to go home PT Goal Formulation: With patient Time For Goal Achievement: 04/30/20 Potential to Achieve Goals: Good    Frequency Min 3X/week   Barriers to discharge        Co-evaluation               AM-PAC PT "6 Clicks" Mobility  Outcome Measure Help needed turning from your back to your side while in a flat bed without using bedrails?: A Lot Help needed moving from lying on your back to sitting on the side of a flat bed without using bedrails?: A Lot Help needed moving to and from a bed to a chair (including a wheelchair)?: A Lot Help needed standing up from a chair using your arms (e.g., wheelchair or bedside chair)?: Total Help needed to walk in hospital room?: Total Help needed climbing 3-5 steps with a  railing? : Total 6 Click Score: 9    End of Session Equipment Utilized During Treatment: Gait belt;Oxygen Activity Tolerance: Patient limited by fatigue Patient left: in bed;with call bell/phone within reach;with bed alarm set Nurse Communication: Mobility status PT Visit Diagnosis: Unsteadiness on feet (R26.81);Muscle weakness (generalized) (M62.81)    Time: 2992-4268 PT Time Calculation (min) (ACUTE ONLY): 17 min   Charges:   PT Evaluation $PT Eval Moderate Complexity: 1 Mod          Bellamarie Pflug W,PT Acute Rehabilitation Services Pager:  [phone]  Office:  [phone]    Denice Paradise 04/16/2020, 2:58 PM

## 2020-04-16 NOTE — Progress Notes (Signed)
Progress Note  Patient Name: Anthony Galvan Date of Encounter: 04/16/2020  [facility] Cardiologist: Rozann Lesches, MD   Subjective   Reports had some chest pain this morning, describes as worse with deep breaths  Inpatient Medications    Scheduled Meds: . aspirin EC  81 mg Oral Daily  . atorvastatin  80 mg Oral Daily  . feeding supplement  237 mL Oral BID BM  . heparin  5,000 Units Subcutaneous Q8H  . ipratropium-albuterol  3 mL Nebulization TID  . isosorbide mononitrate  60 mg Oral Daily  . mouth rinse  15 mL Mouth Rinse BID  . metoprolol tartrate  12.5 mg Oral BID  . multivitamin with minerals  1 tablet Oral Daily  . pantoprazole  40 mg Oral BID   Continuous Infusions:  PRN Meds: acetaminophen **OR** acetaminophen, alum & mag hydroxide-simeth, guaiFENesin-dextromethorphan, ondansetron **OR** ondansetron (ZOFRAN) IV   Vital Signs    Vitals:   04/15/20 2034 04/16/20 0002 04/16/20 0003 04/16/20 0425  BP: 115/63 (!) 109/56  117/67  Pulse: 69 71 76 84  Resp: 18 18 (!) 21 20  Temp: 98 F (36.7 C) 98.1 F (36.7 C)  97.8 F (36.6 C)  TempSrc: Oral Oral  Oral  SpO2: 98% 99% 100% 98%  Weight:      Height:        Intake/Output Summary (Last 24 hours) at 04/16/2020 0909 Last data filed at 04/16/2020 0427 Gross per 24 hour  Intake --  Output 250 ml  Net -250 ml   Last 3 Weights 04/13/2020 04/12/2020 10/01/2019  Weight (lbs) 139 lb 15.9 oz 150 lb 160 lb  Weight (kg) 63.5 kg 68.04 kg 72.576 kg      Telemetry    Normal sinus rhythm with rate 70 states- Personally Reviewed  ECG    No new ECG- Personally Reviewed  Physical Exam   GEN: No acute distress.   Neck: + JVD Cardiac: RRR, 2/6 systolic murmur loudest at apex Respiratory: Bilateral crackles GI: Soft, nontender, non-distended  MS: 1+ edema Neuro:  Nonfocal  Psych: Normal affect   Labs    High Sensitivity Troponin:   Recent Labs  Lab 04/12/20 1722 04/12/20 1908 04/13/20 0853   TROPONINIHS 271* 248* 238*      Chemistry Recent Labs  Lab 04/12/20 1722 04/13/20 0854 04/14/20 1457 04/15/20 0030 04/16/20 0057  NA 136   < > 137 137 140  K 4.1   < > 4.5 4.2 4.4  CL 101   < > 102 104 106  CO2 21*   < > 24 24 24   GLUCOSE 83   < > 95 108* 120*  BUN 58*   < > 47* 49* 48*  CREATININE 3.78*   < > 2.97* 2.86* 2.56*  CALCIUM 8.8*   < > 8.6* 8.3* 8.7*  PROT 6.8  --   --   --   --   ALBUMIN 3.5  --   --   --   --   AST 39  --   --   --   --   ALT 23  --   --   --   --   ALKPHOS 67  --   --   --   --   BILITOT 2.0*  --   --   --   --   GFRNONAA 14*   < > 19* 20* 22*  ANIONGAP 14   < > 11 9 10    < > =  values in this interval not displayed.     Hematology Recent Labs  Lab 04/14/20 0105 04/15/20 0030 04/16/20 0057  WBC 9.2 7.5 8.2  RBC 3.55* 3.50* 3.41*  HGB 10.4* 10.2* 10.4*  HCT 33.6* 32.7* 31.9*  MCV 94.6 93.4 93.5  MCH 29.3 29.1 30.5  MCHC 31.0 31.2 32.6  RDW 16.2* 16.2* 16.4*  PLT 143* 136* 119*    BNP Recent Labs  Lab 04/12/20 1722  BNP 437.0*     DDimer No results for input(s): DDIMER in the last 168 hours.   Radiology    No results found.  Cardiac Studies   Echo 04/13/20: 1. Left ventricular ejection fraction, by estimation, is 65 to 70%. The  left ventricle has normal function. The left ventricle has no regional  wall motion abnormalities. There is mild left ventricular hypertrophy.  Left ventricular diastolic parameters  are consistent with Grade I diastolic dysfunction (impaired relaxation).  2. Right ventricular systolic function is low normal. The right  ventricular size is moderately enlarged. Mildly increased right  ventricular wall thickness. There is moderately elevated pulmonary artery  systolic pressure.  3. Right atrial size was severely dilated.  4. The mitral valve is normal in structure. Trivial mitral valve  regurgitation.  5. Tricuspid valve regurgitation is moderate to severe.  6. The aortic valve is  abnormal. Aortic valve regurgitation is trivial.  Mild to moderate aortic valve sclerosis/calcification is present, without  any evidence of aortic stenosis.  7. The inferior vena cava is dilated in size with <50% respiratory  variability, suggesting right atrial pressure of 15 mmHg.   Comparison(s): No significant change from prior study.   Patient Profile     86 y.o. male with history of CAD status post BMS to LAD in 2014, last cath in 2018 showing patent stent and 80% distal LCx stenosis not suitable for PCI, ILD on baseline 3 to 4 L, chronic diastolic heart failure, pulmonary hypertension, moderate to severe TR, PE in 05/2018, hypertension, hyperlipidemia, GERD, CKD who we are consulted to see for NSTEMI.  Assessment & Plan    NSTEMI:  status post BMS to LAD in 2014, last cath in 2018 showing patent stent and 80% distal LCx stenosis not suitable for PCI.  Presented to [facility] ED on 04/12/2020 with chest pain (per notes, he does not recall any chest pain).  Troponin peak 271.  No ST changes on EKG.  Echo on 12/28 showed LVEF 65 to 70%, low normal RV function, moderately enlarged RV, moderate to severe TR (unchanged from prior study in 01/2019).  Creatinine 3.8 on admission (up from 1.6 03/2019).  Given age, comorbidities, and AKI, not a candidate for cardiac catheterization, medical management recommended -Completed 48 hours of heparin drip, have discontinued -Continue aspirin 81 mg daily and statin -Continue Imdur 60 mg daily -Continue metoprolol 12.5 mg twice daily  AKI on CKD: Creatinine 3.8 on admission, up from 1.6 on 04/14/2019.  Improving, creatinine 2.6 today.  Chronic diastolic heart failure/pulmonary hypertension: Pulmonary hypertension likely secondary to ILD and HFpEF.  Holding diuresis given AKI, creatinine improving.   Chronic respiratory failure: Due to ILD.  He is on baseline 4 L [place]    For questions or updates, please contact [place] Please consult  [email or web address] for contact info under        Signed, Donato Heinz, MD  04/16/2020, 9:09 AM

## 2020-04-16 NOTE — Care Management Important Message (Signed)
Important Message  Patient Details  Name: Anthony Galvan MRN: [id number] Date of Birth: 1921/07/08   Medicare Important Message Given:  Yes     Orbie Pyo 04/16/2020, 2:17 PM

## 2020-04-16 NOTE — TOC Progression Note (Signed)
Transition of Care [facility]) - Progression Note    Patient Details  Name: OMARR HANN MRN: [id number] Date of Birth: Aug 25, 1921  Transition of Care [facility] CM/SW Contact  Zenon Mayo, RN Phone Number: 04/16/2020, 4:40 PM  Clinical Narrative:    NCM spoke with patient and he states he does not want HH servces. He is [facility] he [facility] to see Dr. Kelton Pillar. He is on 4 liters of oxygen at home thru [employer].  NCM offered to set up HHPT for  Him.  He states to call his daughter.  NCM contacted Santiago Glad , his daughter.  Santiago Glad states that he will have maxium home helath aide thru the [place] for 4 days/week for 4 hrs at 11 to 3:00.  Santiago Glad states she would like for patient to be transported to the [place] in [place] at [address] [place] [address] at discharge because Dr. Kelton Pillar his primary care wants to evaluate him  [phone].  After he evaluates patient she then will take him home.          Expected Discharge Plan and Services                                                 Social Determinants of Health (SDOH) Interventions    Readmission Risk Interventions Readmission Risk Prevention Plan 01/29/2019 01/28/2019  Transportation Screening - Complete  PCP or Specialist Appt within 3-5 Days Complete -  HRI or [place] - Complete  Social Work Consult for [place] Planning/Counseling - Complete  Palliative Care Screening - Not Complete  Medication Review Press photographer) - Complete  Some recent data might be hidden

## 2020-04-17 DIAGNOSIS — I249 Acute ischemic heart disease, unspecified: Secondary | ICD-10-CM | POA: Diagnosis not present

## 2020-04-17 LAB — CBC
HCT: 33.2 % — ABNORMAL LOW (ref 39.0–52.0)
Hemoglobin: 10.9 g/dL — ABNORMAL LOW (ref 13.0–17.0)
MCH: 30.7 pg (ref 26.0–34.0)
MCHC: 32.8 g/dL (ref 30.0–36.0)
MCV: 93.5 fL (ref 80.0–100.0)
Platelets: 121 10*3/uL — ABNORMAL LOW (ref 150–400)
RBC: 3.55 MIL/uL — ABNORMAL LOW (ref 4.22–5.81)
RDW: 16.6 % — ABNORMAL HIGH (ref 11.5–15.5)
WBC: 7.8 10*3/uL (ref 4.0–10.5)
nRBC: 0 % (ref 0.0–0.2)

## 2020-04-17 LAB — BASIC METABOLIC PANEL
Anion gap: 10 (ref 5–15)
BUN: 39 mg/dL — ABNORMAL HIGH (ref 8–23)
CO2: 26 mmol/L (ref 22–32)
Calcium: 8.8 mg/dL — ABNORMAL LOW (ref 8.9–10.3)
Chloride: 107 mmol/L (ref 98–111)
Creatinine, Ser: 1.97 mg/dL — ABNORMAL HIGH (ref 0.61–1.24)
GFR, Estimated: 31 mL/min — ABNORMAL LOW (ref >60)
Glucose, Bld: 108 mg/dL — ABNORMAL HIGH (ref 70–99)
Potassium: 4.9 mmol/L (ref 3.5–5.1)
Sodium: 143 mmol/L (ref 135–145)

## 2020-04-17 MED ORDER — IPRATROPIUM-ALBUTEROL 0.5-2.5 (3) MG/3ML IN SOLN: 3.0000 mL | Freq: Four times a day (QID) | RESPIRATORY_TRACT | Status: DC | PRN

## 2020-04-17 NOTE — Progress Notes (Signed)
Progress Note  Patient Name: Anthony Galvan Date of Encounter: 04/17/2020  Primary Cardiologist: Rozann Lesches, MD   Subjective   Chest pain resolved. No sob.   Inpatient Medications    Scheduled Meds: . aspirin EC  81 mg Oral Daily  . atorvastatin  80 mg Oral Daily  . feeding supplement  237 mL Oral BID BM  . heparin  5,000 Units Subcutaneous Q8H  . isosorbide mononitrate  60 mg Oral Daily  . mouth rinse  15 mL Mouth Rinse BID  . metoprolol tartrate  12.5 mg Oral BID  . multivitamin with minerals  1 tablet Oral Daily  . nystatin  5 mL Oral QID  . pantoprazole  40 mg Oral BID   Continuous Infusions:  PRN Meds: acetaminophen **OR** acetaminophen, alum & mag hydroxide-simeth, guaiFENesin-dextromethorphan, ipratropium-albuterol, ondansetron **OR** ondansetron (ZOFRAN) IV   Vital Signs    Vitals:   04/16/20 2300 04/17/20 0300 04/17/20 0800 04/17/20 0819  BP: 114/70 107/64 106/76   Pulse: 78 74 79   Resp: 20 17 19    Temp: 98.3 F (36.8 C) 98.3 F (36.8 C) 97.6 F (36.4 C)   TempSrc: Oral Oral Oral   SpO2: 97% 97% 100% 100%  Weight:      Height:        Intake/Output Summary (Last 24 hours) at 04/17/2020 0847 Last data filed at 04/16/2020 2000 Gross per 24 hour  Intake --  Output 450 ml  Net -450 ml   Filed Weights   04/12/20 1324 04/13/20 2146  Weight: 68 kg 63.5 kg    Telemetry    NSR with PVC's - Personally Reviewed  ECG    none - Personally Reviewed  Physical Exam   GEN: No acute distress.   Neck: No JVD Cardiac: RRR, no murmurs, rubs, or gallops.  Respiratory: Clear to auscultation bilaterally. GI: Soft, nontender, non-distended  MS: No edema; No deformity. Neuro:  Nonfocal  Psych: Normal affect   Labs    Chemistry Recent Labs  Lab 04/12/20 1722 04/13/20 0854 04/15/20 0030 04/16/20 0057 04/17/20 0054  NA 136   < > 137 140 143  K 4.1   < > 4.2 4.4 4.9  CL 101   < > 104 106 107  CO2 21*   < > 24 24 26   GLUCOSE 83   < > 108*  120* 108*  BUN 58*   < > 49* 48* 39*  CREATININE 3.78*   < > 2.86* 2.56* 1.97*  CALCIUM 8.8*   < > 8.3* 8.7* 8.8*  PROT 6.8  --   --   --   --   ALBUMIN 3.5  --   --   --   --   AST 39  --   --   --   --   ALT 23  --   --   --   --   ALKPHOS 67  --   --   --   --   BILITOT 2.0*  --   --   --   --   GFRNONAA 14*   < > 20* 22* 31*  ANIONGAP 14   < > 9 10 10    < > = values in this interval not displayed.     Hematology Recent Labs  Lab 04/15/20 0030 04/16/20 0057 04/17/20 0054  WBC 7.5 8.2 7.8  RBC 3.50* 3.41* 3.55*  HGB 10.2* 10.4* 10.9*  HCT 32.7* 31.9* 33.2*  MCV 93.4 93.5 93.5  MCH 29.1 30.5 30.7  MCHC 31.2 32.6 32.8  RDW 16.2* 16.4* 16.6*  PLT 136* 119* 121*    Cardiac EnzymesNo results for input(s): TROPONINI in the last 168 hours. No results for input(s): TROPIPOC in the last 168 hours.   BNP Recent Labs  Lab 04/12/20 1722  BNP 437.0*     DDimer No results for input(s): DDIMER in the last 168 hours.   Radiology    No results found.  Cardiac Studies     Patient Profile     87 y.o. male admitted with chest pain, elevated troponin and worsening renal function.  Assessment & Plan    1. NSTEMI - he is pain free and will stop heparin and increase activity. He could be discharged tomorrow if able to walk the halls without symptoms. 2. Acute on chronic renal failure - creatinine continues to improve form 2.6 to 2.0.  3. Chronic diastolic heart failure - we will have him ambulate but he looks to be very close to baseline euvolemia.      For questions or updates, please contact [place] Please consult [email or web address] for contact info under Cardiology/STEMI.      Signed, Cristopher Peru, MD  04/17/2020, 8:47 AM  Patient ID: Anthony Galvan, male   DOB: 02-23-1922, 87 y.o.   MRN: [id number]

## 2020-04-17 NOTE — Progress Notes (Signed)
Progress Note    Anthony Galvan  JKK:[id number] DOB: 1922/04/02  DOA: 04/12/2020 PCP: Iona Beard, MD    Brief Narrative:     Medical records reviewed and are as summarized below:  Anthony Galvan is an 87 y.o. male with past medical history of coronary artery disease, interstitial lung disease on 4 L nasal cannula, moderate to severe tricuspid regurg, who was admitted on 12/27 with non-STEMI.  He has chronic kidney disease and therefore cannot have left heart cath so cardiology is recommending medical management.  Assessment/Plan:   Principal Problem:   Acute coronary syndrome with high troponin (HCC) Active Problems:   Hyperlipidemia with target low density lipoprotein (LDL) cholesterol less than 70 mg/dL   Essential hypertension   GERD   CAD S/P percutaneous coronary angioplasty   PUD (peptic ulcer disease)   Normocytic anemia   Hyperbilirubinemia   CKD (chronic kidney disease), stage V (HCC)   NSTEMI (non-ST elevated myocardial infarction) ([place])    NSTEMI--renal function precludes LHC cardiologist recommends medical management Troponin 271>>248 -EKG without acute findings -s/o IV Heparin -Echo on 04/13/2020 with EF of 65 to 71%, grade 1 diastolic dysfunction, no significant regional wall motion normalities -change nitro to imdur -Continue Lopressor 12.5 mg twice daily, aspirin 81 mg daily and Lipitor 80 mg daily -echo:EF of 65 to 69%, grade 1 diastolic dysfunction, no significant regional wall motion normalities -trial of nystatin swish and swallow-- with improvement  AKI----acute kidney injury on CKD stage - 3B   -Baseline creatinine usually around 1.4, creatinine on admission up to 3.78 --renally adjust medications, avoid nephrotoxic agents / dehydration  / hypotension -trending down -daily labs  CAD-- -  -- s/p BMS to LAD in 2014withrepeat cath in 10/2016 showing patent stent and 80% distal LCx stenosis not suitable for PCI and medical management  recommended  chronic hypoxic respiratory failure due to interstitial lung disease - oxygen requirement appears to be at baseline of 4 L/min at this time  HFpEF/Pulm HTN/tricuspid regurg--- repeat -Echo on 04/13/2020 with EF of 65 to 67%, grade 1 diastolic dysfunction, no significant regional wall motion normalities -Lasix on hold due to AKI -Pulmonary HTN most likely due to underlying lung disease/ILD -Previously evaluated by cardiology service and deemed not to be a candidate for advanced therapies for heart failure management to help with hypertension management  GERD/PUD --continue Protonix 40 mg BID  PT Eval- per daughter he is wheelchair dependant at home  Family Communication/Anticipated D/C date and plan/Code Status   DVT prophylaxis: Heparin  Code Status: Full Code.  Disposition Plan: Status is: Inpatient Called daughter and had to [facility] Remains inpatient appropriate because:Inpatient level of care appropriate due to severity of illness   Dispo: The patient is from: Home              Anticipated d/c is to: Home              Anticipated d/c date is: 1-2 days              Patient currently is not medically stable to d/c.return of renal function to normal         Medical Consultants:    Cardiology  Subjective:   Was cold this AM  Objective:    Vitals:   04/17/20 0300 04/17/20 0800 04/17/20 0819 04/17/20 1100  BP: 107/64 106/76  128/72  Pulse: 74 79  86  Resp: 17 19  17   Temp: 98.3 F (36.8 C) 97.6  F (36.4 C)  98.2 F (36.8 C)  TempSrc: Oral Oral  Oral  SpO2: 97% 100% 100% 98%  Weight:      Height:        Intake/Output Summary (Last 24 hours) at 04/17/2020 1508 Last data filed at 04/17/2020 0715 Gross per 24 hour  Intake --  Output 525 ml  Net -525 ml   Filed Weights   04/12/20 1324 04/13/20 2146  Weight: 68 kg 63.5 kg    Exam:  General: Appearance:    Well developed, well nourished male in no acute distress     Lungs:     On [employer],  respirations unlabored  Heart:    Normal heart rate. Normal rhythm. No murmurs, rubs, or gallops.   MS:   All extremities are intact.   Neurologic:   Awake, alert, pleasant and cooperative     Data Reviewed:   I have personally reviewed following labs and imaging studies:  Labs: Labs show the following:   Basic Metabolic Panel: Recent Labs  Lab 04/13/20 0854 04/14/20 1457 04/15/20 0030 04/16/20 0057 04/17/20 0054  NA 137 137 137 140 143  K 4.3 4.5 4.2 4.4 4.9  CL 103 102 104 106 107  CO2 22 24 24 24 26   GLUCOSE 66* 95 108* 120* 108*  BUN 54* 47* 49* 48* 39*  CREATININE 3.34* 2.97* 2.86* 2.56* 1.97*  CALCIUM 8.5* 8.6* 8.3* 8.7* 8.8*   GFR Estimated Creatinine Clearance: 18.4 mL/min (A) (by C-G formula based on SCr of 1.97 mg/dL (H)). Liver Function Tests: Recent Labs  Lab 04/12/20 1722  AST 39  ALT 23  ALKPHOS 67  BILITOT 2.0*  PROT 6.8  ALBUMIN 3.5   No results for input(s): LIPASE, AMYLASE in the last 168 hours. No results for input(s): AMMONIA in the last 168 hours. Coagulation profile No results for input(s): INR, PROTIME in the last 168 hours.  CBC: Recent Labs  Lab 04/12/20 1234 04/13/20 0311 04/14/20 0105 04/15/20 0030 04/16/20 0057 04/17/20 0054  WBC 9.8 8.2 9.2 7.5 8.2 7.8  NEUTROABS 6.2  --   --   --   --   --   HGB 12.3* 11.0* 10.4* 10.2* 10.4* 10.9*  HCT 39.7 36.6* 33.6* 32.7* 31.9* 33.2*  MCV 96.1 98.9 94.6 93.4 93.5 93.5  PLT 178 126* 143* 136* 119* 121*   Cardiac Enzymes: No results for input(s): CKTOTAL, CKMB, CKMBINDEX, TROPONINI in the last 168 hours. BNP (last 3 results) No results for input(s): PROBNP in the last 8760 hours. CBG: Recent Labs  Lab 04/16/20 1145  GLUCAP 104*   D-Dimer: No results for input(s): DDIMER in the last 72 hours. Hgb A1c: No results for input(s): HGBA1C in the last 72 hours. Lipid Profile: No results for input(s): CHOL, HDL, LDLCALC, TRIG, CHOLHDL, LDLDIRECT in the last 72 hours. Thyroid  function studies: No results for input(s): TSH, T4TOTAL, T3FREE, THYROIDAB in the last 72 hours.  Invalid input(s): FREET3 Anemia work up: No results for input(s): VITAMINB12, FOLATE, FERRITIN, TIBC, IRON, RETICCTPCT in the last 72 hours. Sepsis Labs: Recent Labs  Lab 04/14/20 0105 04/15/20 0030 04/16/20 0057 04/17/20 0054  WBC 9.2 7.5 8.2 7.8    Microbiology Recent Results (from the past 240 hour(s))  Resp Panel by RT-PCR (Flu A&B, Covid) Nasopharyngeal Swab     Status: None   Collection Time: 04/12/20  3:27 PM   Specimen: Nasopharyngeal Swab; Nasopharyngeal(NP) swabs in vial transport medium  Result Value Ref Range Status   SARS  Coronavirus 2 by RT PCR NEGATIVE NEGATIVE Final    Comment: (NOTE) SARS-CoV-2 target nucleic acids are NOT DETECTED.  The SARS-CoV-2 RNA is generally detectable in upper respiratory specimens during the acute phase of infection. The lowest concentration of SARS-CoV-2 viral copies this assay can detect is 138 copies/mL. A negative result does not preclude SARS-Cov-2 infection and should not be used as the sole basis for treatment or other patient management decisions. A negative result may occur with  improper specimen collection/handling, submission of specimen other than nasopharyngeal swab, presence of viral mutation(s) within the areas targeted by this assay, and inadequate number of viral copies(<138 copies/mL). A negative result must be combined with clinical observations, patient history, and epidemiological information. The expected result is Negative.  Fact Sheet for Patients:  EntrepreneurPulse.com.au  Fact Sheet for Healthcare Providers:  IncredibleEmployment.be  This test is no t yet approved or cleared by the [place] FDA and  has been authorized for detection and/or diagnosis of SARS-CoV-2 by FDA under an Emergency Use Authorization (EUA). This EUA will remain  in effect (meaning this test  can be used) for the duration of the COVID-19 declaration under Section 564(b)(1) of the Act, 21 U.S.C.section 360bbb-3(b)(1), unless the authorization is terminated  or revoked sooner.       Influenza A by PCR NEGATIVE NEGATIVE Final   Influenza B by PCR NEGATIVE NEGATIVE Final    Comment: (NOTE) The Xpert Xpress SARS-CoV-2/FLU/RSV plus assay is intended as an aid in the diagnosis of influenza from Nasopharyngeal swab specimens and should not be used as a sole basis for treatment. Nasal washings and aspirates are unacceptable for Xpert Xpress SARS-CoV-2/FLU/RSV testing.  Fact Sheet for Patients: EntrepreneurPulse.com.au  Fact Sheet for Healthcare Providers: IncredibleEmployment.be  This test is not yet approved or cleared by the [place] FDA and has been authorized for detection and/or diagnosis of SARS-CoV-2 by FDA under an Emergency Use Authorization (EUA). This EUA will remain in effect (meaning this test can be used) for the duration of the COVID-19 declaration under Section 564(b)(1) of the Act, 21 U.S.C. section 360bbb-3(b)(1), unless the authorization is terminated or revoked.  Performed at [facility], [address], [place], [place] [address]   MRSA PCR Screening     Status: None   Collection Time: 04/13/20 12:43 AM   Specimen: Nasal Mucosa; Nasopharyngeal  Result Value Ref Range Status   MRSA by PCR NEGATIVE NEGATIVE Final    Comment:        The GeneXpert MRSA Assay (FDA approved for NASAL specimens only), is one component of a comprehensive MRSA colonization surveillance program. It is not intended to diagnose MRSA infection nor to guide or monitor treatment for MRSA infections. Performed at [facility], [place] [address], [place], [place] [address]     Procedures and diagnostic studies:  No results found.  Medications:   . aspirin EC  81 mg Oral Daily  . atorvastatin  80 mg Oral Daily  . feeding  supplement  237 mL Oral BID BM  . heparin  5,000 Units Subcutaneous Q8H  . isosorbide mononitrate  60 mg Oral Daily  . mouth rinse  15 mL Mouth Rinse BID  . metoprolol tartrate  12.5 mg Oral BID  . multivitamin with minerals  1 tablet Oral Daily  . nystatin  5 mL Oral QID  . pantoprazole  40 mg Oral BID   Continuous Infusions:    LOS: 4 days   Geradine Girt  [facility]   How to contact the [facility] Attending or Consulting provider [place] or covering provider during after hours [place], for this patient?  1. Check the care team in [facility] and look for a) attending/consulting TRH provider listed and b) the [facility] team listed 2. Log into [email or web address] and use [place]'s universal password to access. If you do not have the password, please contact the hospital operator. 3. Locate the [facility] provider you are looking for under [facility] and page to a number that you can be directly reached. 4. If you still have difficulty reaching the provider, please page the [facility] (Director on Call) for the Hospitalists listed on amion for assistance.  04/17/2020, 3:08 PM

## 2020-04-17 NOTE — TOC Progression Note (Addendum)
Transition of Care [facility]) - Progression Note    Patient Details  Name: BARTLEY VUOLO MRN: [id number] Date of Birth: 08/11/1921  Transition of Care [facility]) CM/SW [place], [place] Phone Number: 04/17/2020, 10:37 AM  Clinical Narrative:    CSW received a consult from physician concerning disposition for pt.  CSW spoke with pt's daughter concerning disposition as stated in previous note.  Pt's daughter would like for pt to be discharged and transported to the [facility] for pt's physician Dr. Kelton Pillar [phone]) to see pt.  Pt's daughter stated that the [place] has asked her to set this up with [facility].  Pt's daughter reports that Dr. Kelton Pillar will be in his office on Monday morning and can be reached at the above number.  Pt's daughter also stated that if pt discharges before Monday he is to transported to the ED or Urgent Care at [facility].  CSW mention to daughter that pt's physician from [facility] may need to speak with her concerning disposition. TOC  will continue to assist with disposition planning.  Update: CSW spoke with [place].  The following requirements will need to occur before disposition to [place]. 1. Will need auth from [place] to transport pt from acute to acute.  2.  will need to be set up with receiving hospital ( [place]).        Expected Discharge Plan and Services                                                 Social Determinants of Health (SDOH) Interventions    Readmission Risk Interventions Readmission Risk Prevention Plan 01/29/2019 01/28/2019  Transportation Screening - Complete  PCP or Specialist Appt within 3-5 Days Complete -  HRI or [place] - Complete  Social Work Consult for [place] Planning/Counseling - Complete  Palliative Care Screening - Not Complete  Medication Review Press photographer) - Complete  Some recent data might be hidden

## 2020-04-17 NOTE — Progress Notes (Signed)
Received message from Avie Echevaria that daughter wants patient transferred to the [place] in [place] at discharge. I talked to Simran Mannis daughter [phone]).  The hospital can only provide acute to acute transfers to the [place] with authorization in place. Pt will have an inpt bed and will be admitted to the [facility] for ongoing medical services. If the patient is medically stable for discharge, the patient can follow up at the facility of his choice and [facility] cannot provide transportation for this unless he needs transportation home.  Per conversation with the daughter Santiago Glad, she has had communication with the pt PCP at the Clinic and the physician wants him seen there at discharge. I informed her that this is outpatient and the hospital is not responsible for transporting him to the [place] to be seen by his PCP. I explained to her the difference between inpatient and outpatient services. [facility] will have to communicate with the [facility] team if everything is in place for transitioning to the [place] but this is acute to acute, not at discharge. At discharge, his visit is considered Outpatient follow up. Santiago Glad expressed understanding and stated that she will contact the [place] for clarity. I informed her that the [place] must talk to the [facility] team at the hospital and authorization must be in place.   Aneta Mins Transition of Care Supervisor [phone]

## 2020-04-17 NOTE — Plan of Care (Signed)

## 2020-04-17 NOTE — TOC Progression Note (Signed)
Transition of Care [facility]) - Progression Note    Patient Details  Name: Anthony Galvan MRN: [id number] Date of Birth: 17-Nov-1921  Transition of Care [facility]) CM/SW [place], [place] Phone Number: 04/17/2020, 12:42 PM  Clinical Narrative:    Pt's daughter contacted CSW for updated information. Pt's daughter gave CSW contact information if pt discharges before Monday to contact [facility] @ [phone], ext [phone].  Pt will go to ER for examination. The  [facility] office opens on Monday.   and he is there today to contact.        Expected Discharge Plan and Services                                                 Social Determinants of Health (SDOH) Interventions    Readmission Risk Interventions Readmission Risk Prevention Plan 01/29/2019 01/28/2019  Transportation Screening - Complete  PCP or Specialist Appt within 3-5 Days Complete -  HRI or [place] - Complete  Social Work Consult for [place] Planning/Counseling - Complete  Palliative Care Screening - Not Complete  Medication Review Press photographer) - Complete  Some recent data might be hidden

## 2020-04-18 DIAGNOSIS — I249 Acute ischemic heart disease, unspecified: Secondary | ICD-10-CM | POA: Diagnosis not present

## 2020-04-18 LAB — BASIC METABOLIC PANEL
Anion gap: 6 (ref 5–15)
BUN: 30 mg/dL — ABNORMAL HIGH (ref 8–23)
CO2: 27 mmol/L (ref 22–32)
Calcium: 8.4 mg/dL — ABNORMAL LOW (ref 8.9–10.3)
Chloride: 106 mmol/L (ref 98–111)
Creatinine, Ser: 1.67 mg/dL — ABNORMAL HIGH (ref 0.61–1.24)
GFR, Estimated: 37 mL/min — ABNORMAL LOW (ref >60)
Glucose, Bld: 85 mg/dL (ref 70–99)
Potassium: 5.1 mmol/L (ref 3.5–5.1)
Sodium: 139 mmol/L (ref 135–145)

## 2020-04-18 LAB — CBC
HCT: 32.1 % — ABNORMAL LOW (ref 39.0–52.0)
Hemoglobin: 10.3 g/dL — ABNORMAL LOW (ref 13.0–17.0)
MCH: 30.4 pg (ref 26.0–34.0)
MCHC: 32.1 g/dL (ref 30.0–36.0)
MCV: 94.7 fL (ref 80.0–100.0)
Platelets: 114 10*3/uL — ABNORMAL LOW (ref 150–400)
RBC: 3.39 MIL/uL — ABNORMAL LOW (ref 4.22–5.81)
RDW: 16.8 % — ABNORMAL HIGH (ref 11.5–15.5)
WBC: 8.3 10*3/uL (ref 4.0–10.5)
nRBC: 0 % (ref 0.0–0.2)

## 2020-04-18 MED ORDER — FUROSEMIDE 40 MG PO TABS
40.0000 mg | ORAL_TABLET | Freq: Every day | ORAL | Status: DC
  Administered 2020-04-18 – 2020-04-19 (×2): 40 mg via ORAL
  Filled 2020-04-18 (×2): qty 1

## 2020-04-18 NOTE — Progress Notes (Signed)
Progress Note  Patient Name: Anthony Galvan Date of Encounter: 04/18/2020  Primary Cardiologist: Rozann Lesches, MD   Subjective   Still a little dyspnea.   Inpatient Medications    Scheduled Meds: . aspirin EC  81 mg Oral Daily  . atorvastatin  80 mg Oral Daily  . feeding supplement  237 mL Oral BID BM  . heparin  5,000 Units Subcutaneous Q8H  . isosorbide mononitrate  60 mg Oral Daily  . mouth rinse  15 mL Mouth Rinse BID  . metoprolol tartrate  12.5 mg Oral BID  . multivitamin with minerals  1 tablet Oral Daily  . nystatin  5 mL Oral QID  . pantoprazole  40 mg Oral BID   Continuous Infusions:  PRN Meds: acetaminophen **OR** acetaminophen, alum & mag hydroxide-simeth, guaiFENesin-dextromethorphan, ipratropium-albuterol, ondansetron **OR** ondansetron (ZOFRAN) IV   Vital Signs    Vitals:   04/17/20 2334 04/18/20 0305 04/18/20 0700 04/18/20 0743  BP: 130/69 117/64  127/65  Pulse: 76 77  87  Resp: 20 18  (!) 25  Temp: 98.1 F (36.7 C) 98.3 F (36.8 C)  97.8 F (36.6 C)  TempSrc: Oral Oral Oral Oral  SpO2: 98% 97%  95%  Weight:      Height:        Intake/Output Summary (Last 24 hours) at 04/18/2020 1055 Last data filed at 04/18/2020 0550 Gross per 24 hour  Intake 100 ml  Output 825 ml  Net -725 ml   Filed Weights   04/12/20 1324 04/13/20 2146  Weight: 68 kg 63.5 kg    Telemetry    nsr - Personally Reviewed  ECG    none - Personally Reviewed  Physical Exam   GEN: No acute distress.   Neck: 7 cm JVD Cardiac: RRR, no murmurs, rubs, or gallops.  Respiratory: Clear to auscultation bilaterally. GI: Soft, nontender, non-distended  MS: No edema; No deformity. Neuro:  Nonfocal  Psych: Normal affect   Labs    Chemistry Recent Labs  Lab 04/12/20 1722 04/13/20 0854 04/16/20 0057 04/17/20 0054 04/18/20 0055  NA 136   < > 140 143 139  K 4.1   < > 4.4 4.9 5.1  CL 101   < > 106 107 106  CO2 21*   < > 24 26 27   GLUCOSE 83   < > 120* 108* 85   BUN 58*   < > 48* 39* 30*  CREATININE 3.78*   < > 2.56* 1.97* 1.67*  CALCIUM 8.8*   < > 8.7* 8.8* 8.4*  PROT 6.8  --   --   --   --   ALBUMIN 3.5  --   --   --   --   AST 39  --   --   --   --   ALT 23  --   --   --   --   ALKPHOS 67  --   --   --   --   BILITOT 2.0*  --   --   --   --   GFRNONAA 14*   < > 22* 31* 37*  ANIONGAP 14   < > 10 10 6    < > = values in this interval not displayed.     Hematology Recent Labs  Lab 04/16/20 0057 04/17/20 0054 04/18/20 0055  WBC 8.2 7.8 8.3  RBC 3.41* 3.55* 3.39*  HGB 10.4* 10.9* 10.3*  HCT 31.9* 33.2* 32.1*  MCV 93.5 93.5 94.7  MCH 30.5 30.7 30.4  MCHC 32.6 32.8 32.1  RDW 16.4* 16.6* 16.8*  PLT 119* 121* 114*    Cardiac EnzymesNo results for input(s): TROPONINI in the last 168 hours. No results for input(s): TROPIPOC in the last 168 hours.   BNP Recent Labs  Lab 04/12/20 1722  BNP 437.0*     DDimer No results for input(s): DDIMER in the last 168 hours.   Radiology    No results found.  Cardiac Studies   none  Patient Profile     87 y.o. male admitted with NSTEMI/worsening renal failure and dyspnea.  Assessment & Plan    1. NSTEMI - he denies anginal symptom. Note plans for no invasive workup. 2. Acute on chronic renal failure - his creatinine is nearing baseline. 3. Acute on chronic diastolic heart failure - his exam today suggest he is a little more wet. I will order lasix.      For questions or updates, please contact [place] Please consult [email or web address] for contact info under Cardiology/STEMI.      Signed, Cristopher Peru, MD  04/18/2020, 10:55 AM  Patient ID: Anthony Galvan, male   DOB: [birth year], 87 y.o.   MRN: [id number]

## 2020-04-18 NOTE — Progress Notes (Signed)
Progress Note    [name]  KXF:[id number] DOB: November 18, 1921  DOA: 04/12/2020 PCP: Iona Beard, MD    Brief Narrative:     Medical records reviewed and are as summarized below:  Anthony Galvan is an 87 y.o. male with past medical history of coronary artery disease, interstitial lung disease on 4 L nasal cannula, moderate to severe tricuspid regurg, who was admitted on 12/27 with non-STEMI.  He has chronic kidney disease and therefore cannot have left heart cath so cardiology is recommending medical management.  Assessment/Plan:   Principal Problem:   Acute coronary syndrome with high troponin (HCC) Active Problems:   Hyperlipidemia with target low density lipoprotein (LDL) cholesterol less than 70 mg/dL   Essential hypertension   GERD   CAD S/P percutaneous coronary angioplasty   PUD (peptic ulcer disease)   Normocytic anemia   Hyperbilirubinemia   CKD (chronic kidney disease), stage V (HCC)   NSTEMI (non-ST elevated myocardial infarction) ([place])    NSTEMI--renal function precludes LHC cardiologist recommends medical management Troponin 271>>248 -EKG without acute findings -s/o IV Heparin -Echo on 04/13/2020 with EF of 65 to 69%, grade 1 diastolic dysfunction, no significant regional wall motion normalities -change nitro to imdur -Continue Lopressor 12.5 mg twice daily, aspirin 81 mg daily and Lipitor 80 mg daily -echo:EF of 65 to 67%, grade 1 diastolic dysfunction, no significant regional wall motion normalities -trial of nystatin swish and swallow-- with improvement  AKI----acute kidney injury on CKD stage - 3B   -Baseline creatinine usually around 1.4, creatinine on admission up to 3.78 --renally adjust medications, avoid nephrotoxic agents / dehydration  / hypotension -trending down -daily labs -resume PO lasix and check kidney function in the AM  CAD-- -  -- s/p BMS to LAD in 2014withrepeat cath in 10/2016 showing patent stent and 80% distal LCx  stenosis not suitable for PCI and medical management recommended  chronic hypoxic respiratory failure due to interstitial lung disease - oxygen requirement appears to be at baseline of 4 L/min at this time  HFpEF/Pulm HTN/tricuspid regurg--- repeat -Echo on 04/13/2020 with EF of 65 to 89%, grade 1 diastolic dysfunction, no significant regional wall motion normalities -resume PO lasix -Pulmonary HTN most likely due to underlying lung disease/ILD -Previously evaluated by cardiology service and deemed not to be a candidate for advanced therapies for heart failure management to help with hypertension management  GERD/PUD --continue Protonix 40 mg BID  PT Eval- per daughter he is wheelchair dependant at home  Family Communication/Anticipated D/C date and plan/Code Status   DVT prophylaxis: Heparin  Code Status: Full Code.  Disposition Plan: Status is: Inpatient Called daughter and had to LM 1/1 Remains inpatient appropriate because:Inpatient level of care appropriate due to severity of illness   Dispo: The patient is from: Home              Anticipated d/c is to: Home              Anticipated d/c date is: 1-2 days              Patient currently is not medically stable to d/c.return of renal function to normal with lasix         Medical Consultants:    Cardiology  Subjective:   When discussing his fluid intake and soda patient said "if I drank too much soda reckon I would not have lived this long"  Objective:    Vitals:   04/18/20 0305 04/18/20 0700  04/18/20 0743 04/18/20 1123  BP: 117/64  127/65 128/70  Pulse: 77  87 79  Resp: 18  (!) 25 (!) 24  Temp: 98.3 F (36.8 C)  97.8 F (36.6 C) 98.2 F (36.8 C)  TempSrc: Oral Oral Oral Oral  SpO2: 97%  95% 100%  Weight:      Height:        Intake/Output Summary (Last 24 hours) at 04/18/2020 1520 Last data filed at 04/18/2020 1200 Gross per 24 hour  Intake 150 ml  Output 625 ml  Net -475 ml   Filed Weights    04/12/20 1324 04/13/20 2146  Weight: 68 kg 63.5 kg    Exam:  General: Appearance:    Well developed, well nourished male in no acute distress     Lungs:     respirations unlabored  Heart:    Normal heart rate. Normal rhythm. No murmurs, rubs, or gallops.   MS:   All extremities are intact.   Neurologic:   Awake, alert, oriented x 3     Data Reviewed:   I have personally reviewed following labs and imaging studies:  Labs: Labs show the following:   Basic Metabolic Panel: Recent Labs  Lab 04/14/20 1457 04/15/20 0030 04/16/20 0057 04/17/20 0054 04/18/20 0055  NA 137 137 140 143 139  K 4.5 4.2 4.4 4.9 5.1  CL 102 104 106 107 106  CO2 24 24 24 26 27   GLUCOSE 95 108* 120* 108* 85  BUN 47* 49* 48* 39* 30*  CREATININE 2.97* 2.86* 2.56* 1.97* 1.67*  CALCIUM 8.6* 8.3* 8.7* 8.8* 8.4*   GFR Estimated Creatinine Clearance: 21.7 mL/min (A) (by C-G formula based on SCr of 1.67 mg/dL (H)). Liver Function Tests: Recent Labs  Lab 04/12/20 1722  AST 39  ALT 23  ALKPHOS 67  BILITOT 2.0*  PROT 6.8  ALBUMIN 3.5   No results for input(s): LIPASE, AMYLASE in the last 168 hours. No results for input(s): AMMONIA in the last 168 hours. Coagulation profile No results for input(s): INR, PROTIME in the last 168 hours.  CBC: Recent Labs  Lab 04/12/20 1234 04/13/20 0311 04/14/20 0105 04/15/20 0030 04/16/20 0057 04/17/20 0054 04/18/20 0055  WBC 9.8   < > 9.2 7.5 8.2 7.8 8.3  NEUTROABS 6.2  --   --   --   --   --   --   HGB 12.3*   < > 10.4* 10.2* 10.4* 10.9* 10.3*  HCT 39.7   < > 33.6* 32.7* 31.9* 33.2* 32.1*  MCV 96.1   < > 94.6 93.4 93.5 93.5 94.7  PLT 178   < > 143* 136* 119* 121* 114*   < > = values in this interval not displayed.   Cardiac Enzymes: No results for input(s): CKTOTAL, CKMB, CKMBINDEX, TROPONINI in the last 168 hours. BNP (last 3 results) No results for input(s): PROBNP in the last 8760 hours. CBG: Recent Labs  Lab 04/16/20 1145  GLUCAP 104*    D-Dimer: No results for input(s): DDIMER in the last 72 hours. Hgb A1c: No results for input(s): HGBA1C in the last 72 hours. Lipid Profile: No results for input(s): CHOL, HDL, LDLCALC, TRIG, CHOLHDL, LDLDIRECT in the last 72 hours. Thyroid function studies: No results for input(s): TSH, T4TOTAL, T3FREE, THYROIDAB in the last 72 hours.  Invalid input(s): FREET3 Anemia work up: No results for input(s): VITAMINB12, FOLATE, FERRITIN, TIBC, IRON, RETICCTPCT in the last 72 hours. Sepsis Labs: Recent Labs  Lab 04/15/20 0030 04/16/20  6712 04/17/20 0054 04/18/20 0055  WBC 7.5 8.2 7.8 8.3    Microbiology Recent Results (from the past 240 hour(s))  Resp Panel by RT-PCR (Flu A&B, Covid) Nasopharyngeal Swab     Status: None   Collection Time: 04/12/20  3:27 PM   Specimen: Nasopharyngeal Swab; Nasopharyngeal(NP) swabs in vial transport medium  Result Value Ref Range Status   SARS Coronavirus 2 by RT PCR NEGATIVE NEGATIVE Final    Comment: (NOTE) SARS-CoV-2 target nucleic acids are NOT DETECTED.  The SARS-CoV-2 RNA is generally detectable in upper respiratory specimens during the acute phase of infection. The lowest concentration of SARS-CoV-2 viral copies this assay can detect is 138 copies/mL. A negative result does not preclude SARS-Cov-2 infection and should not be used as the sole basis for treatment or other patient management decisions. A negative result may occur with  improper specimen collection/handling, submission of specimen other than nasopharyngeal swab, presence of viral mutation(s) within the areas targeted by this assay, and inadequate number of viral copies(<138 copies/mL). A negative result must be combined with clinical observations, patient history, and epidemiological information. The expected result is Negative.  Fact Sheet for Patients:  EntrepreneurPulse.com.au  Fact Sheet for Healthcare Providers:   IncredibleEmployment.be  This test is no t yet approved or cleared by the [place] FDA and  has been authorized for detection and/or diagnosis of SARS-CoV-2 by FDA under an Emergency Use Authorization (EUA). This EUA will remain  in effect (meaning this test can be used) for the duration of the COVID-19 declaration under Section 564(b)(1) of the Act, 21 U.S.C.section 360bbb-3(b)(1), unless the authorization is terminated  or revoked sooner.       Influenza A by PCR NEGATIVE NEGATIVE Final   Influenza B by PCR NEGATIVE NEGATIVE Final    Comment: (NOTE) The Xpert Xpress SARS-CoV-2/FLU/RSV plus assay is intended as an aid in the diagnosis of influenza from Nasopharyngeal swab specimens and should not be used as a sole basis for treatment. Nasal washings and aspirates are unacceptable for Xpert Xpress SARS-CoV-2/FLU/RSV testing.  Fact Sheet for Patients: EntrepreneurPulse.com.au  Fact Sheet for Healthcare Providers: IncredibleEmployment.be  This test is not yet approved or cleared by the [place] FDA and has been authorized for detection and/or diagnosis of SARS-CoV-2 by FDA under an Emergency Use Authorization (EUA). This EUA will remain in effect (meaning this test can be used) for the duration of the COVID-19 declaration under Section 564(b)(1) of the Act, 21 U.S.C. section 360bbb-3(b)(1), unless the authorization is terminated or revoked.  Performed at [facility], [address], [place], [place] [address]   MRSA PCR Screening     Status: None   Collection Time: 04/13/20 12:43 AM   Specimen: Nasal Mucosa; Nasopharyngeal  Result Value Ref Range Status   MRSA by PCR NEGATIVE NEGATIVE Final    Comment:        The GeneXpert MRSA Assay (FDA approved for NASAL specimens only), is one component of a comprehensive MRSA colonization surveillance program. It is not intended to diagnose MRSA infection nor to guide  or monitor treatment for MRSA infections. Performed at [facility], [place] [address] [place], [place] [address]     Procedures and diagnostic studies:  No results found.  Medications:   . aspirin EC  81 mg Oral Daily  . atorvastatin  80 mg Oral Daily  . feeding supplement  237 mL Oral BID BM  . furosemide  40 mg Oral Daily  . heparin  5,000  Units Subcutaneous Q8H  . isosorbide mononitrate  60 mg Oral Daily  . mouth rinse  15 mL Mouth Rinse BID  . metoprolol tartrate  12.5 mg Oral BID  . multivitamin with minerals  1 tablet Oral Daily  . pantoprazole  40 mg Oral BID   Continuous Infusions:    LOS: 5 days   Geradine Girt  [facility]   How to contact the [facility] Attending or Consulting provider [place] or covering provider during after hours [place], for this patient?  1. Check the care team in [facility] and look for a) attending/consulting TRH provider listed and b) the [facility] team listed 2. Log into [email or web address] and use [place]'s universal password to access. If you do not have the password, please contact the hospital operator. 3. Locate the [facility] provider you are looking for under [facility] and page to a number that you can be directly reached. 4. If you still have difficulty reaching the provider, please page the [facility] (Director on Call) for the Hospitalists listed on amion for assistance.  04/18/2020, 3:20 PM

## 2020-04-19 DIAGNOSIS — I249 Acute ischemic heart disease, unspecified: Secondary | ICD-10-CM | POA: Diagnosis not present

## 2020-04-19 LAB — BASIC METABOLIC PANEL
Anion gap: 7 (ref 5–15)
BUN: 28 mg/dL — ABNORMAL HIGH (ref 8–23)
CO2: 28 mmol/L (ref 22–32)
Calcium: 8.6 mg/dL — ABNORMAL LOW (ref 8.9–10.3)
Chloride: 104 mmol/L (ref 98–111)
Creatinine, Ser: 1.59 mg/dL — ABNORMAL HIGH (ref 0.61–1.24)
GFR, Estimated: 39 mL/min — ABNORMAL LOW (ref >60)
Glucose, Bld: 97 mg/dL (ref 70–99)
Potassium: 5 mmol/L (ref 3.5–5.1)
Sodium: 139 mmol/L (ref 135–145)

## 2020-04-19 LAB — CBC
HCT: 32.9 % — ABNORMAL LOW (ref 39.0–52.0)
Hemoglobin: 10.3 g/dL — ABNORMAL LOW (ref 13.0–17.0)
MCH: 30.1 pg (ref 26.0–34.0)
MCHC: 31.3 g/dL (ref 30.0–36.0)
MCV: 96.2 fL (ref 80.0–100.0)
Platelets: 102 10*3/uL — ABNORMAL LOW (ref 150–400)
RBC: 3.42 MIL/uL — ABNORMAL LOW (ref 4.22–5.81)
RDW: 16.6 % — ABNORMAL HIGH (ref 11.5–15.5)
WBC: 7.5 10*3/uL (ref 4.0–10.5)
nRBC: 0 % (ref 0.0–0.2)

## 2020-04-19 MED ORDER — SALINE SPRAY 0.65 % NA SOLN
1.0000 | NASAL | Status: DC | PRN
  Filled 2020-04-19: qty 44

## 2020-04-19 MED ORDER — POLYETHYLENE GLYCOL 3350 17 G PO PACK: 17.0000 g | PACK | ORAL | Status: AC | PRN

## 2020-04-19 MED ORDER — ISOSORBIDE MONONITRATE ER 60 MG PO TB24: 60.0000 mg | ORAL_TABLET | Freq: Every day | ORAL | 0 refills | Status: DC

## 2020-04-19 NOTE — TOC Transition Note (Signed)
Transition of Care [facility]) - CM/SW Discharge Note   Patient Details  Name: DAMARIO GILLIE MRN: [id number] Date of Birth: 05-20-1921  Transition of Care [facility]) CM/SW Contact:  Zenon Mayo, RN Phone Number: 04/19/2020, 3:03 PM   Clinical Narrative:    NCM spoke with daughter , Santiago Glad, she will be transporting patient home today, she will also bring his oxygen tank.  She states he just finished with HHPT so he does not want HHPT at this time.  he will have [facility] home helath aide thru the [place] for 4 days/week for 4 hrs at 11 to 3:00.   Final next level of care: Home/Self Care Barriers to Discharge: No Barriers Identified   Patient Goals and CMS Choice        Discharge Placement                       Discharge Plan and Services                                     Social Determinants of Health (SDOH) Interventions     Readmission Risk Interventions Readmission Risk Prevention Plan 01/29/2019 01/28/2019  Transportation Screening - Complete  PCP or Specialist Appt within 3-5 Days Complete -  HRI or [place] - Complete  Social Work Consult for [place] Planning/Counseling - Complete  Palliative Care Screening - Not Complete  Medication Review [employer]) - Complete  Some recent data might be hidden

## 2020-04-19 NOTE — Consult Note (Signed)
   [facility] CM Inpatient Consult   04/19/2020  Anthony Galvan 1921-09-16 [id number]  [place] [facility] [[facility]] Patient: [name]  Patient was assessed for [place] Management for [employer]. Patient had previous telephonic attempts for outreach with [place] Management for post hospital EMMI calls follow up without success. Electronic medical record review reveals patient is medium risk score. Currently, with follow up per [facility] team notes with [employer].  Plan:  Follow up with General EMMI discharge calls.  Of note, [facility] Care Management services does not replace or interfere with any services that are arranged by inpatient [facility] care management team.   For additional questions or referrals please contact:  Natividad Brood, RN BSN [facility] Liaison  [phone] business mobile phone Toll free office [phone]  Fax number: [phone] [email or web address]

## 2020-04-19 NOTE — Discharge Summary (Addendum)
Physician Discharge Summary  Anthony Galvan ZWC:[id number] DOB: [birth year] DOA: 04/12/2020  PCP: Iona Beard, MD  Admit date: 04/12/2020 Discharge date: 04/19/2020  Admitted From: home Discharge disposition: home   Recommendations for Outpatient Follow-Up:   1. Bmp 1 week 2. Continue home o2 3. If continued chest pain may benefit from GI eval   Discharge Diagnosis:   Principal Problem:   Acute coronary syndrome with high troponin (HCC) Active Problems:   Hyperlipidemia with target low density lipoprotein (LDL) cholesterol less than 70 mg/dL   Essential hypertension   GERD   CAD S/P percutaneous coronary angioplasty   PUD (peptic ulcer disease)   Normocytic anemia   Hyperbilirubinemia   CKD (chronic kidney disease), stage V (HCC)   NSTEMI (non-ST elevated myocardial infarction) [facility])    Discharge Condition: Improved.  Diet recommendation: Low sodium, heart healthy.  Wound care: None.  Code status: Full.   History of Present Illness:   Anthony Galvan is a 87 y.o. male with medical history significant of bronchiectasis, interstitial lung disease on home oxygen of 4 LPM via [place], duodenal ulcer, GERD, H. pylori gastritis, internal hemorrhoids, hyperlipidemia, CAD, chronic diastolic heart failure, pulmonary hypertension, history of pulmonary embolism who is coming to the emergency department with complaints of left-sided chest pain that felt like a pressure on his chest, radiated to his left shoulder, associated with dyspnea and nausea while he was in bed at around 0800 a while after he woke up.  He denies diaphoresis, palpitations or dizziness.  ROS is negative for PND, orthopnea, but states he gets frequent lower extremity edema.  He subsequently called his PCP at the [place] who recommended for him to take aspirin and come to the emergency department.  He also took 2 nitroglycerin tablets, which improved his symptoms briefly, but states that symptoms quickly recurred.   He denies fever, headache, rhinorrhea, sore throat, wheezing or hemoptysis.  Denies abdominal pain, emesis, diarrhea, constipation, melena or hematochezia.  No dysuria, frequency or hematuria.  No polyuria, polydipsia, polyphagia or blurred vision.   Hospital Course by Problem:   NSTEMI--renal function precludes [facility] cardiologist recommends medical management Troponin 271>>248 -EKG without acute findings -s/o IV Heparin -Echo on 04/13/2020 with EF of 65 to 23%, grade 1 diastolic dysfunction, no significant regional wall motion normalities -change nitro to imdur -Continue Lopressor 12.5 mg twice daily, aspirin 81 mg daily and Lipitor 80 mg daily -echo:EF of 65 to 53%, grade 1 diastolic dysfunction, no significant regional wall motion normalities -discussed with cardiology and they agree he is ready for d/c  AKI----acute kidney injury on CKD stage -3B  -Baseline creatinine usually around 1.4, creatinine on admission up to 3.78 --renally adjust medications, avoid nephrotoxic agents / dehydration / hypotension -trending down -daily labs -resumed PO lasix with stable Cr  CAD---  -- s/p BMS to LAD in 2014withrepeat cath in 10/2016 showing patent stent and 80% distal LCx stenosis not suitable for PCI and medical management recommended  chronic hypoxic respiratory failure due to interstitial lung disease -oxygen requirement appears to be at baseline of 4 L/min at this time  HFpEF/Pulm HTN/tricuspid regurg---repeat -Echo on 04/13/2020 with EF of 65 to 61%, grade 1 diastolic dysfunction, no significant regional wall motion normalities -resume PO lasix -PulmonaryHTNmost likely due to underlying lung disease/ILD -Previously evaluated by cardiology service and deemed not to be a candidate for advanced therapies for heart failure management to help with hypertension management  GERD/PUD --continue Protonix 40 mg BID -  if continued issues with chest pain, may need outpatient GI  Eval  PT Eval- per daughter he is wheelchair dependant at home    Medical Consultants:   cards   Discharge Exam:   Vitals:   04/19/20 1056 04/19/20 1100  BP: 109/87 124/61  Pulse: 76 78  Resp: 17   Temp: (!) 97.5 F (36.4 C)   SpO2: 94%    Vitals:   04/19/20 0741 04/19/20 1007 04/19/20 1056 04/19/20 1100  BP: 118/62 105/63 109/87 124/61  Pulse: 76 79 76 78  Resp: 19  17   Temp: 98.1 F (36.7 C)  (!) 97.5 F (36.4 C)   TempSrc: Oral  Oral   SpO2: 99%  94%   Weight:      Height:        General exam: Appears calm and comfortable  The results of significant diagnostics from this hospitalization (including imaging, microbiology, ancillary and laboratory) are listed below for reference.     Procedures and Diagnostic Studies:   DG Chest 2 View  Result Date: 04/13/2020 CLINICAL DATA:  Right rib fracture, pneumothorax EXAM: CHEST - 2 VIEW COMPARISON:  04/12/2020 FINDINGS: Lung volumes are small with asymmetric right-sided volume loss, unchanged from prior examination. No superimposed confluent pulmonary infiltrate. Mild bibasilar scarring. No pneumothorax or pleural effusion. Cardiac size is mildly enlarged, unchanged. Pulmonary vascularity is normal. No acute bone abnormality. Degenerative changes are noted within the shoulders. IMPRESSION: Stable pulmonary hypoinflation.  No pneumothorax identified. Electronically Signed   By: Fidela Salisbury MD   On: 04/13/2020 05:26   DG Chest Port 1 View  Result Date: 04/12/2020 CLINICAL DATA:  Chest pain EXAM: PORTABLE CHEST 1 VIEW COMPARISON:  01/27/2019 FINDINGS: Heart size upper normal. Negative for heart failure or edema. Negative for pneumonia or effusion. Mild scarring in the bases unchanged. Bilateral rotator cuff impingement. IMPRESSION: Mild bibasilar scarring.  No superimposed acute abnormality. Electronically Signed   By: Franchot Gallo M.D.   On: 04/12/2020 14:20   ECHOCARDIOGRAM COMPLETE  Result Date: 04/13/2020     ECHOCARDIOGRAM REPORT   Patient Name:   Anthony Galvan Date of Exam: 04/13/2020 Medical Rec #:  [id number]      Height:       64.0 in Accession #:    [id number]     Weight:       150.0 lb Date of Birth:  [birth year]     BSA:          1.731 m Patient Age:    74 years       BP:           121/51 mmHg Patient Gender: M              HR:           63 bpm. Exam Location:  [facility] Procedure: 2D Echo, Cardiac Doppler and Color Doppler Indications:    NSTEMI I21.4  History:        Patient has prior history of Echocardiogram examinations, most                 recent 01/25/2019. CHF, Previous Myocardial Infarction and CAD;                 Risk Factors:Hypertension, Dyslipidemia and Non-Smoker. S/P                 percutaneous coronary angioplasty.  Sonographer:    Alvino Chapel RCS Referring Phys: [id number] [place]  1.  Left ventricular ejection fraction, by estimation, is 65 to 70%. The left ventricle has normal function. The left ventricle has no regional wall motion abnormalities. There is mild left ventricular hypertrophy. Left ventricular diastolic parameters are consistent with Grade I diastolic dysfunction (impaired relaxation).  2. Right ventricular systolic function is low normal. The right ventricular size is moderately enlarged. Mildly increased right ventricular wall thickness. There is moderately elevated pulmonary artery systolic pressure.  3. Right atrial size was severely dilated.  4. The mitral valve is normal in structure. Trivial mitral valve regurgitation.  5. Tricuspid valve regurgitation is moderate to severe.  6. The aortic valve is abnormal. Aortic valve regurgitation is trivial. Mild to moderate aortic valve sclerosis/calcification is present, without any evidence of aortic stenosis.  7. The inferior vena cava is dilated in size with <50% respiratory variability, suggesting right atrial pressure of 15 mmHg. Comparison(s): No significant change from prior study. FINDINGS  Left  Ventricle: Left ventricular ejection fraction, by estimation, is 65 to 70%. The left ventricle has normal function. The left ventricle has no regional wall motion abnormalities. The left ventricular internal cavity size was normal in size. There is  mild left ventricular hypertrophy. Left ventricular diastolic parameters are consistent with Grade I diastolic dysfunction (impaired relaxation). Right Ventricle: The right ventricular size is moderately enlarged. Mildly increased right ventricular wall thickness. Right ventricular systolic function is low normal. There is moderately elevated pulmonary artery systolic pressure. The tricuspid regurgitant velocity is 3.08 m/s, and with an assumed right atrial pressure of 15 mmHg, the estimated right ventricular systolic pressure is 85.2 mmHg. Left Atrium: Left atrial size was normal in size. Right Atrium: Right atrial size was severely dilated. Pericardium: Trivial pericardial effusion is present. Mitral Valve: The mitral valve is normal in structure. Trivial mitral valve regurgitation. Tricuspid Valve: The tricuspid valve is grossly normal. Tricuspid valve regurgitation is moderate to severe. Aortic Valve: The aortic valve is abnormal. Aortic valve regurgitation is trivial. Mild to moderate aortic valve sclerosis/calcification is present, without any evidence of aortic stenosis. Pulmonic Valve: The pulmonic valve was grossly normal. Pulmonic valve regurgitation is not visualized. Aorta: The aortic root is normal in size and structure. Venous: The inferior vena cava is dilated in size with less than 50% respiratory variability, suggesting right atrial pressure of 15 mmHg. IAS/Shunts: The interatrial septum was not assessed.  LEFT VENTRICLE PLAX 2D LVIDd:         3.60 cm  Diastology LVIDs:         2.40 cm  LV e' medial:    5.55 cm/s LV PW:         1.40 cm  LV E/e' medial:  13.1 LV IVS:        1.40 cm  LV e' lateral:   6.96 cm/s LVOT diam:     2.00 cm  LV E/e' lateral: 10.4  LV SV:         76 LV SV Index:   44 LVOT Area:     3.14 cm  RIGHT VENTRICLE RV S prime:     10.20 cm/s TAPSE (M-mode): 2.2 cm LEFT ATRIUM           Index       RIGHT ATRIUM           Index LA diam:      3.40 cm 1.96 cm/m  RA Area:     25.20 cm LA Vol (A2C): 41.6 ml 24.00 ml/m RA Volume:   78.60 ml  45.40 ml/m LA Vol (A4C): 48.6 ml 28.07 ml/m  AORTIC VALVE LVOT Vmax:   77.30 cm/s LVOT Vmean:  48.900 cm/s LVOT VTI:    0.242 m  AORTA Ao Root diam: 3.10 cm MITRAL VALVE               TRICUSPID VALVE MV Area (PHT): 3.40 cm    TR Peak grad:   37.9 mmHg MV Decel Time: 223 msec    TR Vmax:        308.00 cm/s MV E velocity: 72.70 cm/s MV A velocity: 72.30 cm/s  SHUNTS MV E/A ratio:  1.01        Systemic VTI:  0.24 m                            Systemic Diam: 2.00 cm Dorris Carnes MD Electronically signed by Dorris Carnes MD Signature Date/Time: 04/13/2020/4:35:46 PM    Final      Labs:   Basic Metabolic Panel: Recent Labs  Lab 04/15/20 0030 04/16/20 0057 04/17/20 0054 04/18/20 0055 04/19/20 0040  NA 137 140 143 139 139  K 4.2 4.4 4.9 5.1 5.0  CL 104 106 107 106 104  CO2 24 24 26 27 28   GLUCOSE 108* 120* 108* 85 97  BUN 49* 48* 39* 30* 28*  CREATININE 2.86* 2.56* 1.97* 1.67* 1.59*  CALCIUM 8.3* 8.7* 8.8* 8.4* 8.6*   GFR Estimated Creatinine Clearance: 22.8 mL/min (A) (by C-G formula based on SCr of 1.59 mg/dL (H)). Liver Function Tests: Recent Labs  Lab 04/12/20 1722  AST 39  ALT 23  ALKPHOS 67  BILITOT 2.0*  PROT 6.8  ALBUMIN 3.5   No results for input(s): LIPASE, AMYLASE in the last 168 hours. No results for input(s): AMMONIA in the last 168 hours. Coagulation profile No results for input(s): INR, PROTIME in the last 168 hours.  CBC: Recent Labs  Lab 04/12/20 1234 04/13/20 0311 04/15/20 0030 04/16/20 0057 04/17/20 0054 04/18/20 0055 04/19/20 0040  WBC 9.8   < > 7.5 8.2 7.8 8.3 7.5  NEUTROABS 6.2  --   --   --   --   --   --   HGB 12.3*   < > 10.2* 10.4* 10.9* 10.3* 10.3*   HCT 39.7   < > 32.7* 31.9* 33.2* 32.1* 32.9*  MCV 96.1   < > 93.4 93.5 93.5 94.7 96.2  PLT 178   < > 136* 119* 121* 114* 102*   < > = values in this interval not displayed.   Cardiac Enzymes: No results for input(s): CKTOTAL, CKMB, CKMBINDEX, TROPONINI in the last 168 hours. BNP: Invalid input(s): POCBNP CBG: Recent Labs  Lab 04/16/20 1145  GLUCAP 104*   D-Dimer No results for input(s): DDIMER in the last 72 hours. Hgb A1c No results for input(s): HGBA1C in the last 72 hours. Lipid Profile No results for input(s): CHOL, HDL, LDLCALC, TRIG, CHOLHDL, LDLDIRECT in the last 72 hours. Thyroid function studies No results for input(s): TSH, T4TOTAL, T3FREE, THYROIDAB in the last 72 hours.  Invalid input(s): FREET3 Anemia work up No results for input(s): VITAMINB12, FOLATE, FERRITIN, TIBC, IRON, RETICCTPCT in the last 72 hours. Microbiology Recent Results (from the past 240 hour(s))  Resp Panel by RT-PCR (Flu A&B, Covid) Nasopharyngeal Swab     Status: None   Collection Time: 04/12/20  3:27 PM   Specimen: Nasopharyngeal Swab; Nasopharyngeal(NP) swabs in vial transport medium  Result Value Ref  Range Status   SARS Coronavirus 2 by RT PCR NEGATIVE NEGATIVE Final    Comment: (NOTE) SARS-CoV-2 target nucleic acids are NOT DETECTED.  The SARS-CoV-2 RNA is generally detectable in upper respiratory specimens during the acute phase of infection. The lowest concentration of SARS-CoV-2 viral copies this assay can detect is 138 copies/mL. A negative result does not preclude SARS-Cov-2 infection and should not be used as the sole basis for treatment or other patient management decisions. A negative result may occur with  improper specimen collection/handling, submission of specimen other than nasopharyngeal swab, presence of viral mutation(s) within the areas targeted by this assay, and inadequate number of viral copies(<138 copies/mL). A negative result must be combined with clinical  observations, patient history, and epidemiological information. The expected result is Negative.  Fact Sheet for Patients:  EntrepreneurPulse.com.au  Fact Sheet for Healthcare Providers:  IncredibleEmployment.be  This test is no t yet approved or cleared by the [place] FDA and  has been authorized for detection and/or diagnosis of SARS-CoV-2 by FDA under an Emergency Use Authorization (EUA). This EUA will remain  in effect (meaning this test can be used) for the duration of the COVID-19 declaration under Section 564(b)(1) of the Act, 21 U.S.C.section 360bbb-3(b)(1), unless the authorization is terminated  or revoked sooner.       Influenza A by PCR NEGATIVE NEGATIVE Final   Influenza B by PCR NEGATIVE NEGATIVE Final    Comment: (NOTE) The Xpert Xpress SARS-CoV-2/FLU/RSV plus assay is intended as an aid in the diagnosis of influenza from Nasopharyngeal swab specimens and should not be used as a sole basis for treatment. Nasal washings and aspirates are unacceptable for Xpert Xpress SARS-CoV-2/FLU/RSV testing.  Fact Sheet for Patients: EntrepreneurPulse.com.au  Fact Sheet for Healthcare Providers: IncredibleEmployment.be  This test is not yet approved or cleared by the [place] FDA and has been authorized for detection and/or diagnosis of SARS-CoV-2 by FDA under an Emergency Use Authorization (EUA). This EUA will remain in effect (meaning this test can be used) for the duration of the COVID-19 declaration under Section 564(b)(1) of the Act, 21 U.S.C. section 360bbb-3(b)(1), unless the authorization is terminated or revoked.  Performed at [facility], [address], [place], [place] [address]   MRSA PCR Screening     Status: None   Collection Time: 04/13/20 12:43 AM   Specimen: Nasal Mucosa; Nasopharyngeal  Result Value Ref Range Status   MRSA by PCR NEGATIVE NEGATIVE Final    Comment:         The GeneXpert MRSA Assay (FDA approved for NASAL specimens only), is one component of a comprehensive MRSA colonization surveillance program. It is not intended to diagnose MRSA infection nor to guide or monitor treatment for MRSA infections. Performed at [facility], [place] [address], [place],  [address]      Discharge Instructions:   Discharge Instructions    Amb referral to [facility]   Complete by: As directed    Diet - low sodium heart healthy   Complete by: As directed    Discharge instructions   Complete by: As directed    BMP 1 week   Increase activity slowly   Complete by: As directed      Allergies as of 04/19/2020      Reactions   Lisinopril Other (See Comments)   "Allergic," per the [facility]- patient is unaware of any reaction (??)      Medication List    STOP taking these medications  isosorbide dinitrate 20 MG tablet Commonly known as: ISORDIL     TAKE these medications   acetaminophen 325 MG tablet Commonly known as: TYLENOL Take 2 tablets (650 mg total) by mouth 3 (three) times daily as needed (pain).   alum & mag hydroxide-simeth 200-200-20 MG/5ML suspension Commonly known as: MAALOX/MYLANTA Take 30 mLs by mouth every 4 (four) hours as needed for indigestion.   aspirin EC 81 MG tablet Take 1 tablet (81 mg total) by mouth daily. Swallow whole.   atorvastatin 80 MG tablet Commonly known as: LIPITOR Take 1 tablet (80 mg total) by mouth daily at 6 PM.   calcium carbonate 1250 (500 Ca) MG tablet Commonly known as: OS-CAL - dosed in mg of elemental calcium Take 1 tablet (500 mg of elemental calcium total) by mouth daily with breakfast.   furosemide 40 MG tablet Commonly known as: LASIX Take 1.5 tablets (60 mg total) by mouth daily.   guaiFENesin 600 MG 12 hr tablet Commonly known as: MUCINEX Take 1 tablet (600 mg total) by mouth 2 (two) times daily.   ipratropium-albuterol 0.5-2.5 (3) MG/3ML Soln Commonly known as:  DUONEB Take 3 mLs by nebulization 3 (three) times daily.   isosorbide mononitrate 60 MG 24 hr tablet Commonly known as: IMDUR Take 1 tablet (60 mg total) by mouth daily. Start taking on: April 20, 2020   metoprolol tartrate 25 MG tablet Commonly known as: LOPRESSOR Take 0.5 tablets (12.5 mg total) by mouth 2 (two) times daily.   multivitamin with minerals Tabs tablet Take 1 tablet by mouth daily.   Nitrostat 0.4 MG SL tablet Generic drug: nitroGLYCERIN PLACE 1 TABLET UNDER TONGUE EVERY 5 MINUTES FOR 3 DOSES AS NEEDED. What changed: See the new instructions.   pantoprazole 40 MG tablet Commonly known as: PROTONIX Take 1 tablet (40 mg total) by mouth daily.   polyethylene glycol 17 g packet Commonly known as: MIRALAX / GLYCOLAX Take 17 g by mouth as needed.   Vitamin D (Ergocalciferol) 1.25 MG (50000 UNIT) Caps capsule Commonly known as: DRISDOL Take 1 capsule (50,000 Units total) by mouth every 7 (seven) days.         Time coordinating discharge: 35 min  Signed:  Geradine Girt DO  [facility] 04/19/2020, 11:56 AM

## 2020-04-19 NOTE — Progress Notes (Signed)
Progress Note  Patient Name: Anthony Galvan Date of Encounter: 04/19/2020  Primary Cardiologist: Rozann Lesches, MD   Subjective   . Patient had some mild chest discomfort   Worse with inspiration.    Breathing OK in bed.  Inpatient Medications    Scheduled Meds: . aspirin EC  81 mg Oral Daily  . atorvastatin  80 mg Oral Daily  . feeding supplement  237 mL Oral BID BM  . furosemide  40 mg Oral Daily  . isosorbide mononitrate  60 mg Oral Daily  . mouth rinse  15 mL Mouth Rinse BID  . metoprolol tartrate  12.5 mg Oral BID  . multivitamin with minerals  1 tablet Oral Daily  . pantoprazole  40 mg Oral BID   Continuous Infusions:  PRN Meds: acetaminophen **OR** acetaminophen, alum & mag hydroxide-simeth, guaiFENesin-dextromethorphan, ipratropium-albuterol, ondansetron **OR** ondansetron (ZOFRAN) IV   Vital Signs    Vitals:   04/18/20 2100 04/18/20 2312 04/19/20 0325 04/19/20 0448  BP: (!) 101/55 124/65 118/61   Pulse: 75 79 75   Resp:  20 (!) 21   Temp:  97.9 F (36.6 C) 98.2 F (36.8 C)   TempSrc:  Oral Oral   SpO2:  98% 100%   Weight:    63.5 kg  Height:        Intake/Output Summary (Last 24 hours) at 04/19/2020 0705 Last data filed at 04/19/2020 0400 Gross per 24 hour  Intake 200 ml  Output 1150 ml  Net -950 ml   Filed Weights   04/13/20 2146 04/18/20 1758 04/19/20 0448  Weight: 63.5 kg 60.9 kg 63.5 kg    Telemetry    NSR- Personally Reviewed  ECG    No new - Personally Reviewed  Physical Exam   GEN: No acute distress.   Neck: JVP is not elevated  Cardiac: RRR, no murmurs Respiratory: Moving air   Mild wheezing   GI: Soft, nontender, non-distended  MS: No edema; No deformity. Neuro:  Nonfocal  Psych: Normal affect   Labs    Chemistry Recent Labs  Lab 04/12/20 1722 04/13/20 0854 04/17/20 0054 04/18/20 0055 04/19/20 0040  NA 136   < > 143 139 139  K 4.1   < > 4.9 5.1 5.0  CL 101   < > 107 106 104  CO2 21*   < > 26 27 28   GLUCOSE 83    < > 108* 85 97  BUN 58*   < > 39* 30* 28*  CREATININE 3.78*   < > 1.97* 1.67* 1.59*  CALCIUM 8.8*   < > 8.8* 8.4* 8.6*  PROT 6.8  --   --   --   --   ALBUMIN 3.5  --   --   --   --   AST 39  --   --   --   --   ALT 23  --   --   --   --   ALKPHOS 67  --   --   --   --   BILITOT 2.0*  --   --   --   --   GFRNONAA 14*   < > 31* 37* 39*  ANIONGAP 14   < > 10 6 7    < > = values in this interval not displayed.     Hematology Recent Labs  Lab 04/17/20 0054 04/18/20 0055 04/19/20 0040  WBC 7.8 8.3 7.5  RBC 3.55* 3.39* 3.42*  HGB 10.9* 10.3* 10.3*  HCT 33.2* 32.1* 32.9*  MCV 93.5 94.7 96.2  MCH 30.7 30.4 30.1  MCHC 32.8 32.1 31.3  RDW 16.6* 16.8* 16.6*  PLT 121* 114* 102*    Cardiac EnzymesNo results for input(s): TROPONINI in the last 168 hours. No results for input(s): TROPIPOC in the last 168 hours.   BNP Recent Labs  Lab 04/12/20 1722  BNP 437.0*     DDimer No results for input(s): DDIMER in the last 168 hours.   Radiology    No results found.  Cardiac Studies   Echo   04/13/20  1. Left ventricular ejection fraction, by estimation, is 65 to 70%. The left ventricle has normal function. The left ventricle has no regional wall motion abnormalities. There is mild left ventricular hypertrophy. Left ventricular diastolic parameters are consistent with Grade I diastolic dysfunction (impaired relaxation). 2. Right ventricular systolic function is low normal. The right ventricular size is moderately enlarged. Mildly increased right ventricular wall thickness. There is moderately elevated pulmonary artery systolic pressure. 3. Right atrial size was severely dilated. 4. The mitral valve is normal in structure. Trivial mitral valve regurgitation. 5. Tricuspid valve regurgitation is moderate to severe. 6. The aortic valve is abnormal. Aortic valve regurgitation is trivial. Mild to moderate aortic valve sclerosis/calcification is present, without any evidence of aortic  stenosis. 7. The inferior vena cava is dilated in size with <50% respiratory variability, suggesting right atrial pressure of 15 mmHg. Comparison(s): No significant change from prior study.  Patient Profile     87 y.o. male with hx of CAD admitted with NSTEMI/worsening renal failure and dyspnea.  Assessment & Plan    1.CAD/ NSTEMI   Last cath in 2018  Patent stent to LAD; 80% distal LCx not sutible for PCI    -Presented 12/27  Peak trop 271.    Plan for medical Rx given age, renal insufficiency at time   I am not convinced CP today is cardiac.    2. Acute on chronic diastolic heart failure - Pt got po lasix 40 mg   Net neg 1.7 L    Mild wheeze   Will follow   May need additional IV.  3  CKD  Cr improving  Now 1.59     4  Mod /severe TR  5  Hx PE 22020  Not anticoagulated  6  HTN   BP is OK     7  HL  On lipitor 80 mg       For questions or updates, please contact [facility] Please consult [email or web address] for contact info under Cardiology/STEMI.      Signed, Dorris Carnes, MD  04/19/2020, 7:05 AM  Patient ID: Anthony Galvan, male   DOB: 1921-05-17, 87 y.o.   MRN: [id number]

## 2020-04-19 NOTE — Plan of Care (Signed)

## 2020-04-19 NOTE — Progress Notes (Signed)
Physical Therapy Treatment Patient Details Name: Anthony Galvan MRN: [id number] DOB: 09/30/1921 Today's Date: 04/19/2020    History of Present Illness 87 y.o. male with history of CAD status post BMS to LAD in 2014, last cath in 2018 showing patent stent and 80% distal LCx stenosis not suitable for PCI, on baseline 3 to 4 LO2, chronic diastolic heart failure, pulmonary hypertension, moderate to severe TR, PE in 05/2018, hypertension, hyperlipidemia, GERD, CKD who was admitted for NSTEMI.    PT Comments    Pt is awaiting d/c home with daughter later today. Pt reports only having to pivot to his motorized wheelchair at home. Pt agreeable to get up to side of bed and demonstrate his transfers. Pt is limited in safe mobility by baseline weakness and decreased cognition. Pt able to come to EoB with min guard, stand with modA and take 3 steps along EoB. Pt sat back on EoB and before getting back in bed asked to get to [facility]. Pt modA for transfer back and forth. Pt is likely close to baseline level of function, d/c plans remain appropriate.     Follow Up Recommendations  Home health PT;Supervision/Assistance - 24 hour (if family can provide mod to max assist for transfers)     Equipment Recommendations  None recommended by PT       Precautions / Restrictions Precautions Precautions: Fall Restrictions Weight Bearing Restrictions: No    Mobility  Bed Mobility Overal bed mobility: Needs Assistance Bed Mobility: Supine to Sit;Sit to Supine     Supine to sit: Min guard;HOB elevated Sit to supine: Min assist   General bed mobility comments: min guard for safety, increased time and effort, heavy use of bedrail to pull to EoB. Pt reports that he has hospital bed at home, min A for bringing pt LE back into bed.  Transfers Overall transfer level: Needs assistance Equipment used: 1 person hand held assist Transfers: Sit to/from [facility] Sit to Stand: Mod assist Stand pivot  transfers: Mod assist       General transfer comment: modA for standing EoB and stepping towards the HoB, once he sits back down requests asisst to [facility], modA for stepping transfer bed<>B[facility]  Ambulation/Gait             General Gait Details: only transfers to motorized wheelchair       Balance Overall balance assessment: Needs assistance Sitting-balance support: Feet supported;No upper extremity supported;Bilateral upper extremity supported;Single extremity supported Sitting balance-Leahy Scale: Fair Sitting balance - Comments: able to sit unassisted   Standing balance support: Bilateral upper extremity supported;During functional activity Standing balance-Leahy Scale: Zero Standing balance comment: could not achieve upright stance with support                            Cognition Arousal/Alertness: Awake/alert Behavior During Therapy: WFL for tasks assessed/performed Overall Cognitive Status: History of cognitive impairments - at baseline                                           General Comments General comments (skin integrity, edema, etc.): Pt on 4L O2 via [place] with SaO2> 90%O2, HR max with mobility 100s      Pertinent Vitals/Pain Pain Assessment: No/denies pain           PT Goals (current goals can now be  found in the care plan section) Acute Rehab PT Goals Patient Stated Goal: to go home PT Goal Formulation: With patient Time For Goal Achievement: 04/30/20 Potential to Achieve Goals: Good Progress towards PT goals: Progressing toward goals    Frequency    Min 3X/week      PT Plan Current plan remains appropriate    AM-PAC PT "6 Clicks" Mobility   Outcome Measure  Help needed turning from your back to your side while in a flat bed without using bedrails?: A Lot Help needed moving from lying on your back to sitting on the side of a flat bed without using bedrails?: A Lot Help needed moving to and from a bed to a chair  (including a wheelchair)?: A Lot Help needed standing up from a chair using your arms (e.g., wheelchair or bedside chair)?: Total Help needed to walk in hospital room?: Total Help needed climbing 3-5 steps with a railing? : Total 6 Click Score: 9    End of Session Equipment Utilized During Treatment: Gait belt;Oxygen Activity Tolerance: Patient tolerated treatment well Patient left: in bed;with call bell/phone within reach;with bed alarm set Nurse Communication: Mobility status PT Visit Diagnosis: Unsteadiness on feet (R26.81);Muscle weakness (generalized) (M62.81)     Time: 3810-1751 PT Time Calculation (min) (ACUTE ONLY): 24 min  Charges:  $Therapeutic Activity: 23-37 mins                     Tomekia Helton B. Migdalia Dk PT, DPT Acute Rehabilitation Services Pager [phone] Office [phone]    [place] 04/19/2020, 2:21 PM

## 2020-07-08 DIAGNOSIS — R5381 Other malaise: Secondary | ICD-10-CM | POA: Diagnosis not present

## 2020-07-08 DIAGNOSIS — R531 Weakness: Secondary | ICD-10-CM | POA: Diagnosis not present

## 2020-07-08 DIAGNOSIS — W19XXXA Unspecified fall, initial encounter: Secondary | ICD-10-CM | POA: Diagnosis not present

## 2020-07-12 IMAGING — CT CT ANGIO CHEST
2 of 6 series · 18 of 46 positions shown · IV contrast (omnipaque)
Comparison: Same day chest radiograph, CTA chest 06/13/2018

CLINICAL DATA: PE suspected, positive D-dimer, history of PE
recently stopping Eliquis

EXAM:
CT ANGIOGRAPHY CHEST WITH CONTRAST
TECHNIQUE: Multidetector CT imaging of the chest was performed using the
standard protocol during bolus administration of intravenous
contrast. Multiplanar CT image reconstructions and MIPs were
obtained to evaluate the vascular anatomy.
CONTRAST:  100mL OMNIPAQUE IOHEXOL 350 MG/ML SOLN

[Series 7: pe axial thins · axial · 0.86mm/px · z∈[-99,+194]mm · 15 of 321 slices shown]
[im 14/321  lung]
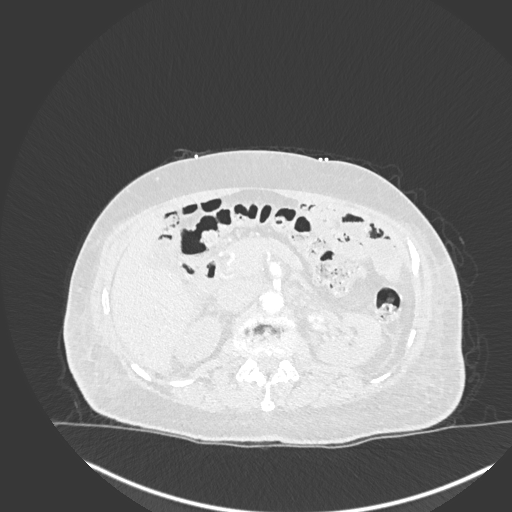
[im 42/321  soft-tissue]
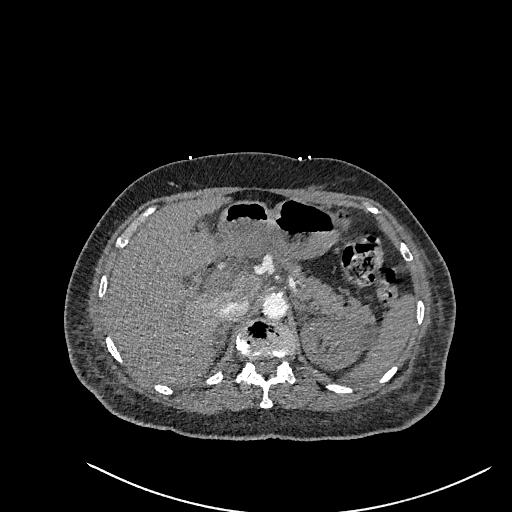
[im 56/321  lung]
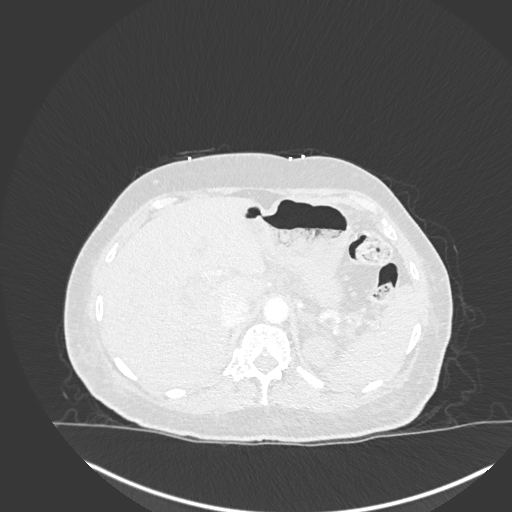
[im 84/321  soft-tissue]
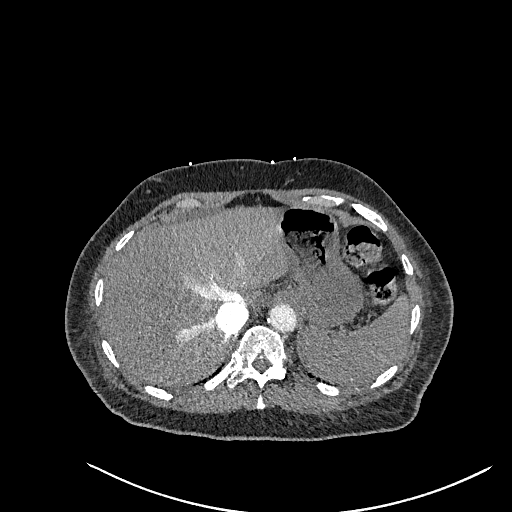
[im 98/321  lung]
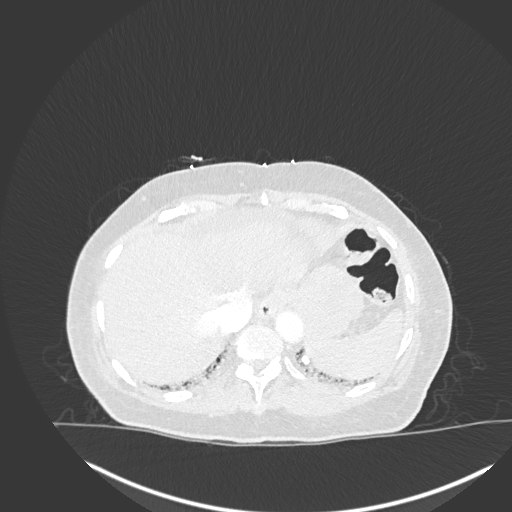
[im 126/321  soft-tissue]
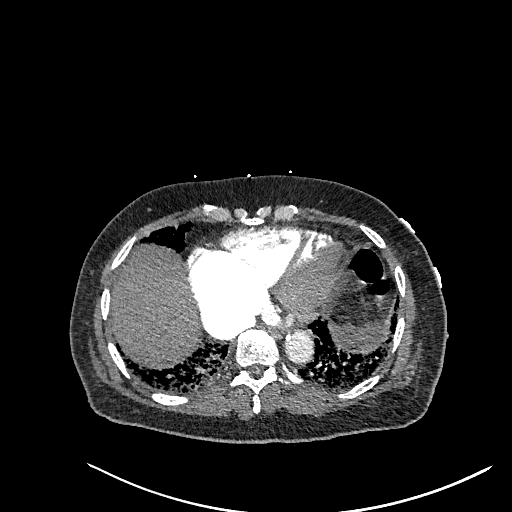
[im 140/321  lung]
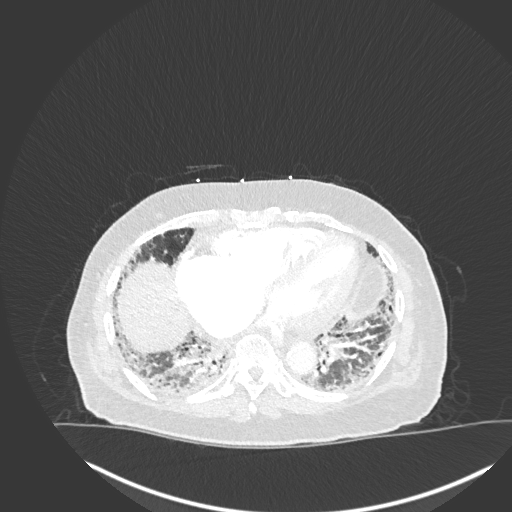
[im 167/321  soft-tissue]
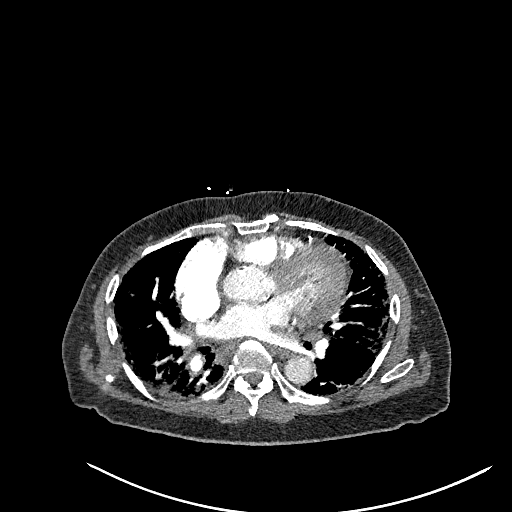
[im 181/321  lung]
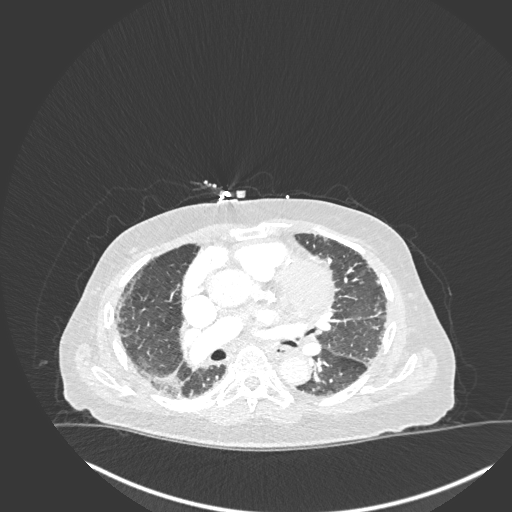
[im 195/321  soft-tissue]
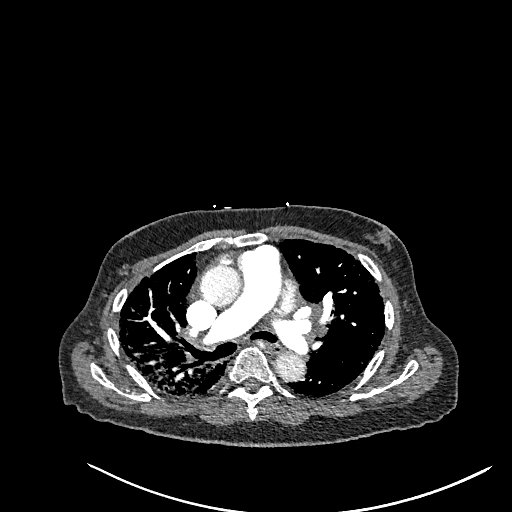
[im 223/321  lung]
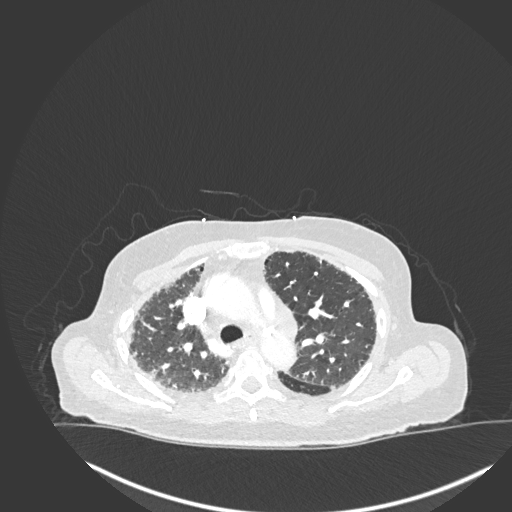
[im 237/321  soft-tissue]
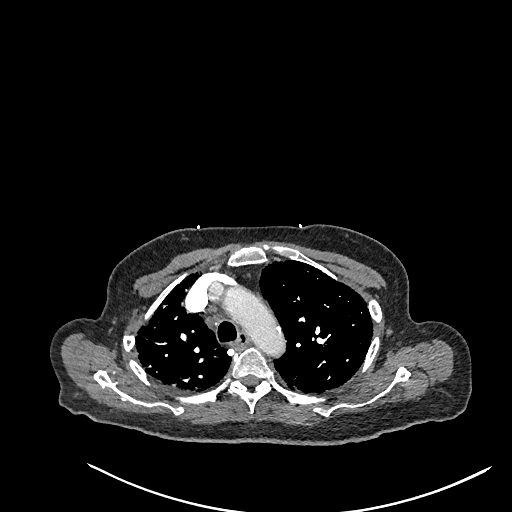
[im 265/321  lung]
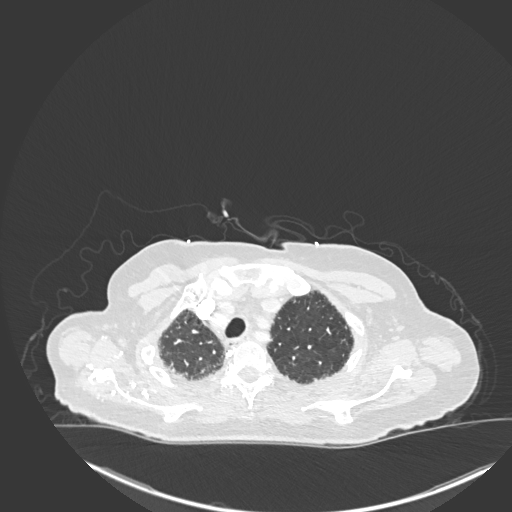
[im 279/321  soft-tissue]
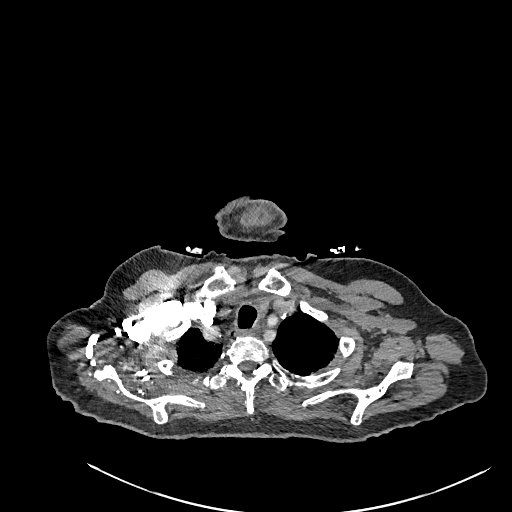
[im 307/321  lung]
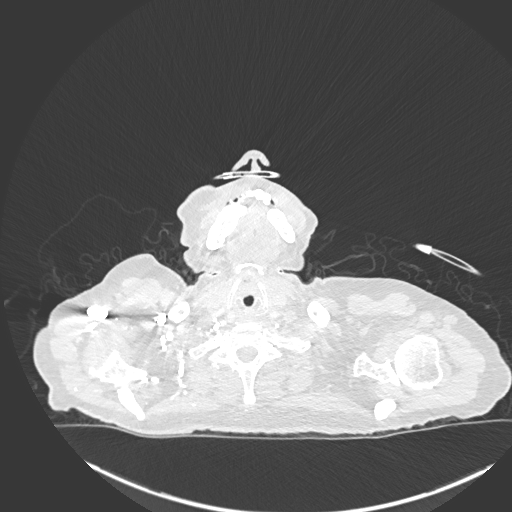

[Series 9: cor soft · coronal · 0.62mm/px · 3 of 130 slices shown]
[im 33/130  soft-tissue]
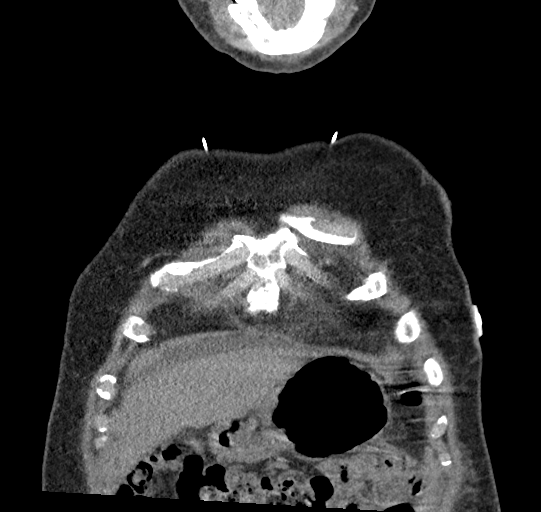
[im 65/130  soft-tissue]
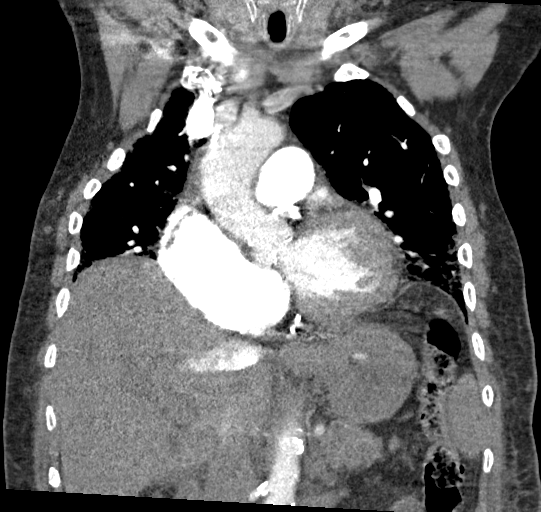
[im 97/130  soft-tissue]
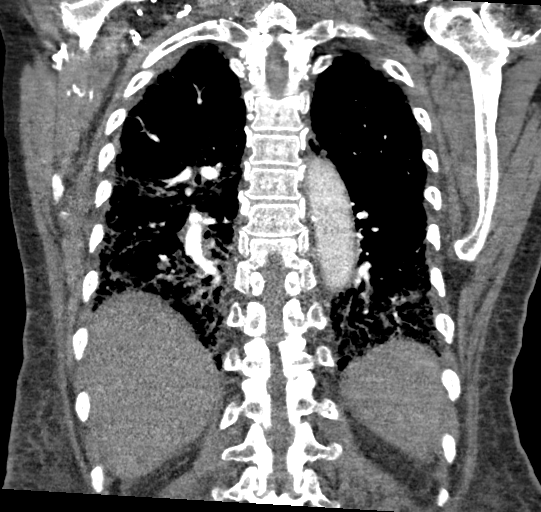

[18 of 46 positions shown; findings below may reference images not displayed]

FINDINGS: Cardiovascular: Satisfactory opacification of the pulmonary arteries
to the segmental level. No evidence of acute or residual pulmonary
embolus. There is central pulmonary arterial enlargement. There is
cardiomegaly with right heart enlargement and reflux of contrast
into the hepatic veins. Atherosclerotic calcification of the
coronary arteries. Calcifications are present upon the aortic valve
leaflets. Atherosclerotic plaque within the normal caliber aorta.
Normal branching of the aortic arch.

Mediastinum/Nodes: Few prominent lymph nodes are again seen within
the mediastinum and hila including a 9 mm left hilar node (6/38), 8
mm AP window lymph node (6/33), and 9 mm right paratracheal lymph
node ([DATE]). Each of these nodes is similar to the comparison exam.
No enlarging lymph nodes are evident. No acute tracheal or
esophageal abnormality. Normal appearance of the thyroid gland and
thoracic inlet.

Lungs/Pleura: There is redemonstration of the basilar predominant
fibrotic change and bronchiectasis. Question early development of
stacked cyst/honeycomb cyst formation. Few scattered calcified
granulomata are present. No acute superimposed consolidative
process. No pneumothorax or effusion.

Upper Abdomen: 2.2 cm upper pole left renal cyst is noted. Upper
abdomen is otherwise unremarkable.

Musculoskeletal: No chest wall abnormality. Poor dentition with
multiple carious lesions and periapical lucencies partially imaged
on this exam.

Review of the MIP images confirms the above findings.
IMPRESSION: 1. No evidence of acute or residual pulmonary embolus.
2. Cardiomegaly with right heart enlargement and reflux of contrast
into the hepatic veins, suggestive of right heart failure. Coronary
atherosclerosis.
3. Basilar predominant fibrotic change and bronchiectasis,
consistent with interstitial lung disease, findings are categorized
as probable UIP per consensus guidelines: Diagnosis of Idiopathic
Pulmonary Fibrosis: An Official ATS/ERS/JRS/ALAT Clinical Practice
Guideline. Am J Respir Crit Care Med Vol 198, [place] 5, [id number][DATE]. Poor dentition with multiple carious lesions and periapical
lucencies partially imaged on this exam. Correlate with dental
examination.
5. Aortic Atherosclerosis ([id number].[id number]).

## 2020-07-15 IMAGING — US US EXTREM LOW VENOUS
1 series · 13 of 24 positions shown · non-contrast
Comparison: None.

CLINICAL DATA: Bilateral lower extremity edema. History of
pulmonary embolism. Evaluate for DVT.



[Series 1: us extrem low venous · 0.08mm/px · 13 of 86 slices shown]
[im 1/86]
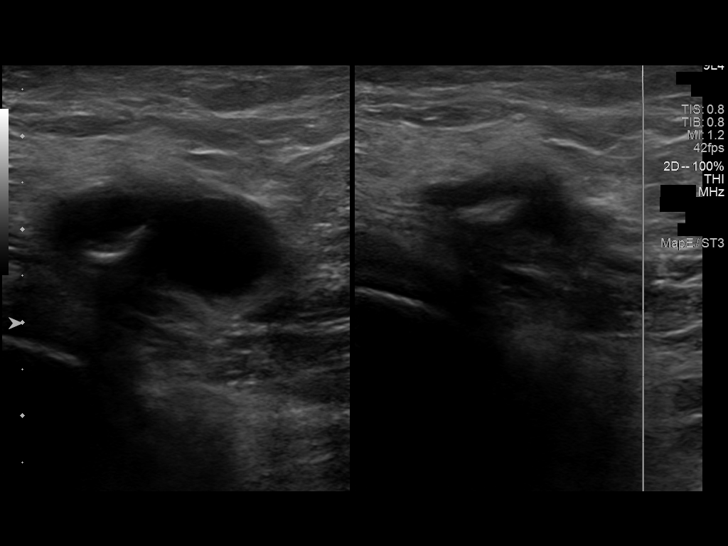
[im 8/86]
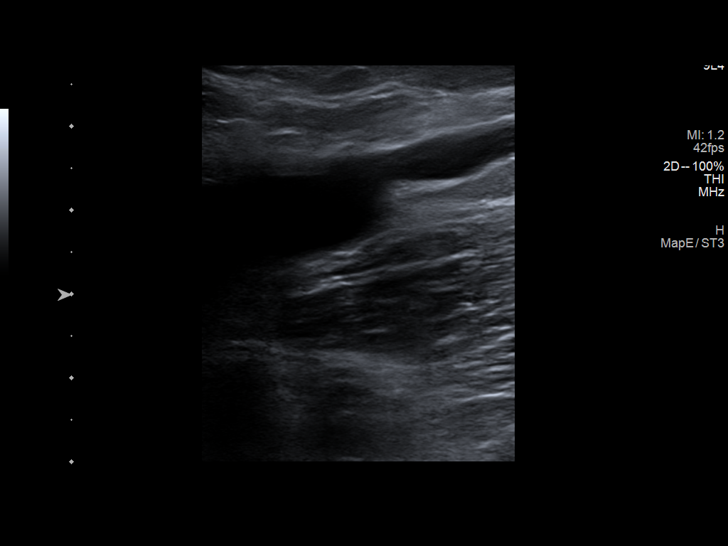
[im 15/86]
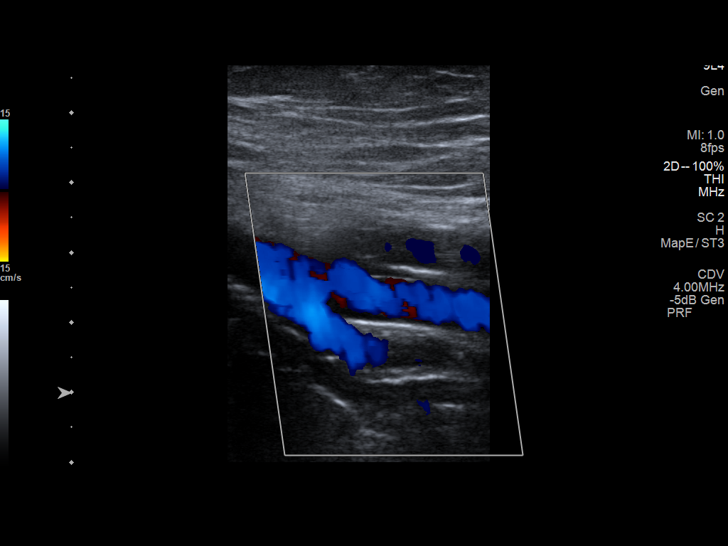
[im 23/86]
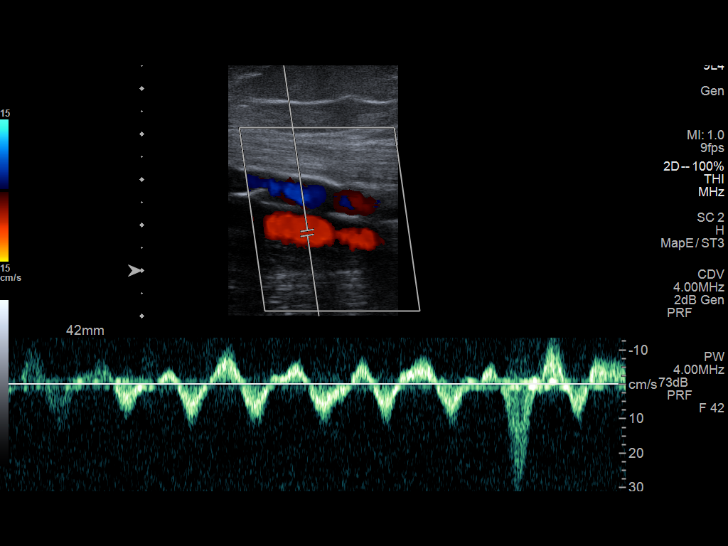
[im 30/86]
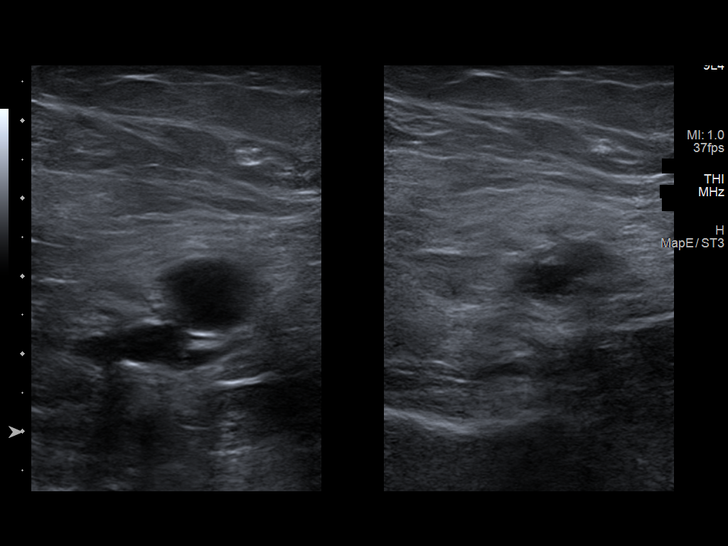
[im 37/86]
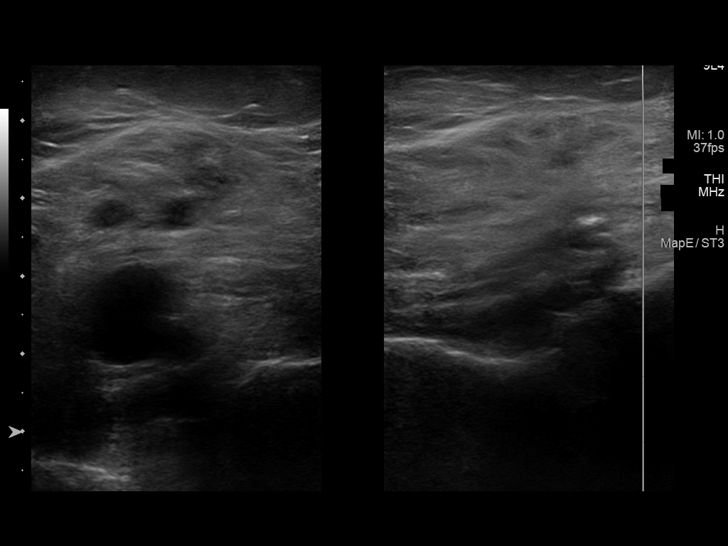
[im 45/86]
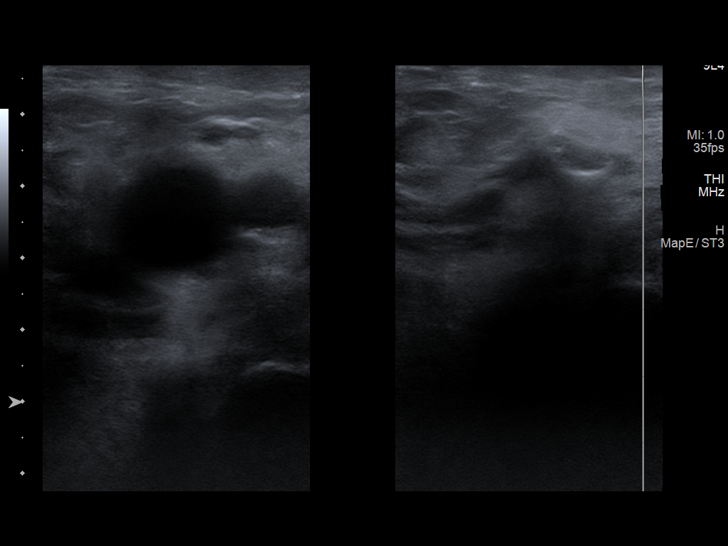
[im 49/86]
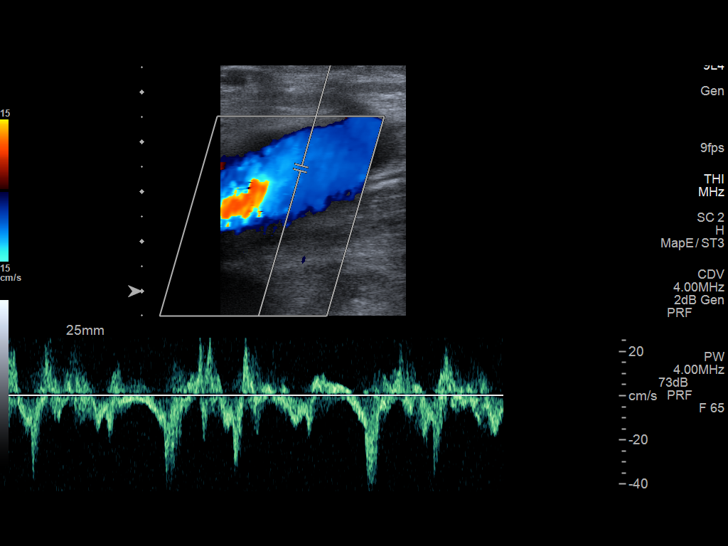
[im 56/86]
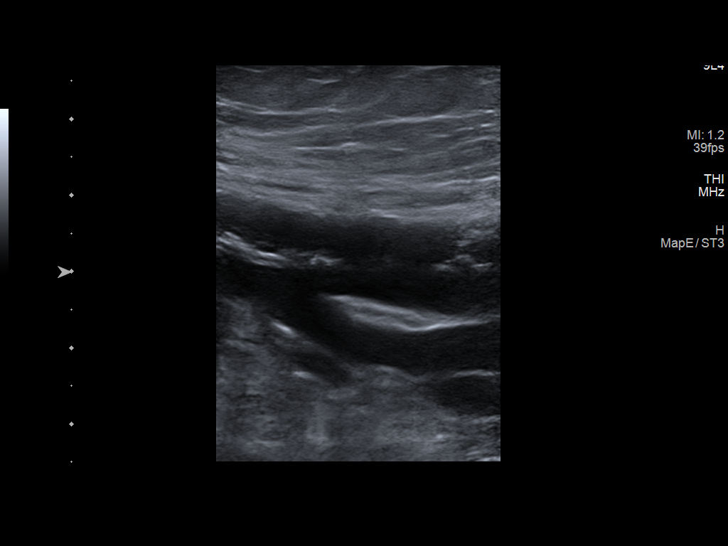
[im 63/86]
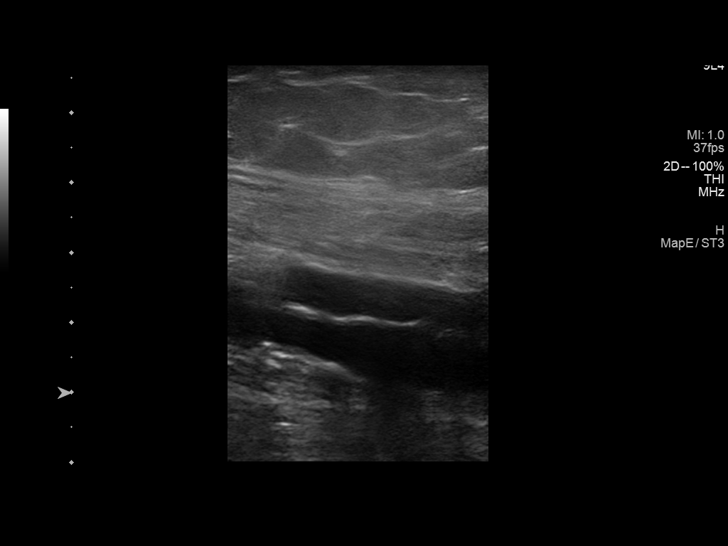
[im 71/86]
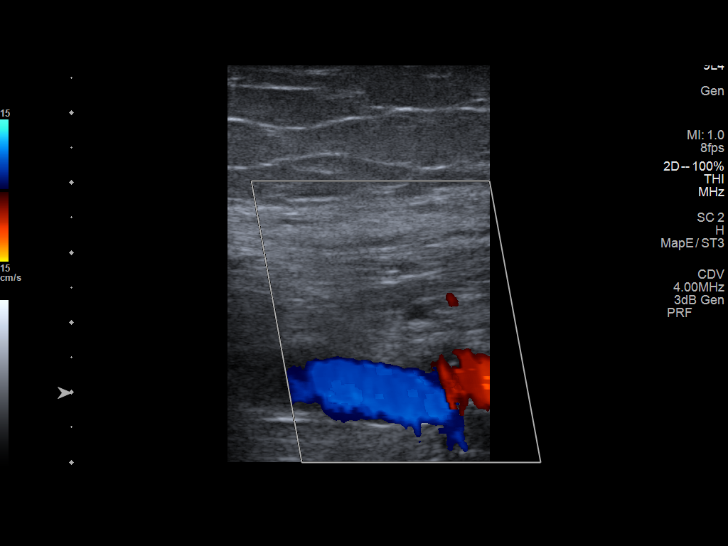
[im 78/86]
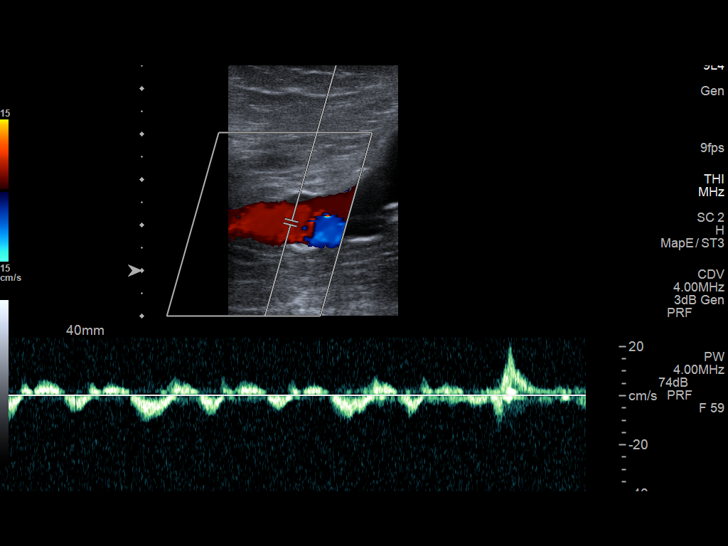
[im 86/86]
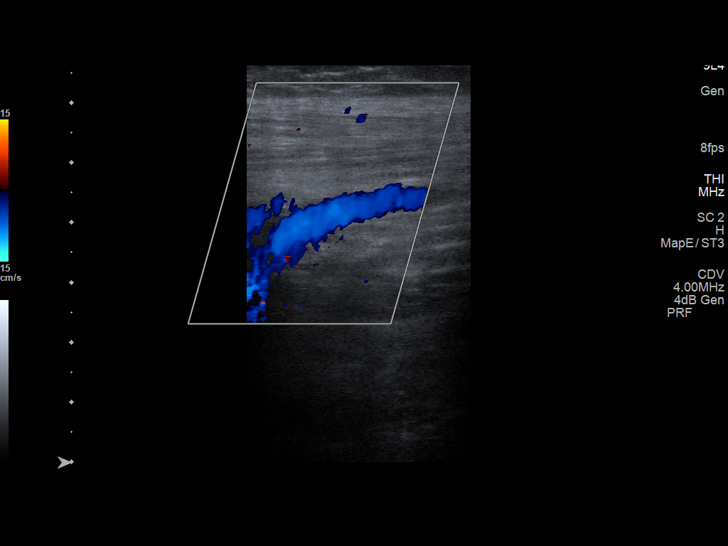

[13 of 24 positions shown; findings below may reference images not displayed]

FINDINGS: RIGHT LOWER EXTREMITY

Common Femoral Vein: No evidence of thrombus. Normal
compressibility, respiratory phasicity and response to augmentation.

Saphenofemoral Junction: No evidence of thrombus. Normal
compressibility and flow on color Doppler imaging.

Profunda Femoral Vein: No evidence of thrombus. Normal
compressibility and flow on color Doppler imaging.

Femoral Vein: No evidence of thrombus. Normal compressibility,
respiratory phasicity and response to augmentation.

Popliteal Vein: No evidence of thrombus. Normal compressibility,
respiratory phasicity and response to augmentation.

Calf Veins: No evidence of thrombus. Normal compressibility and flow
on color Doppler imaging.

Superficial Great Saphenous Vein: No evidence of thrombus. Normal
compressibility.

Venous Reflux:  None.

Other Findings:  None.

LEFT LOWER EXTREMITY

Common Femoral Vein: No evidence of thrombus. Normal
compressibility, respiratory phasicity and response to augmentation.

Saphenofemoral Junction: No evidence of thrombus. Normal
compressibility and flow on color Doppler imaging.

Profunda Femoral Vein: No evidence of thrombus. Normal
compressibility and flow on color Doppler imaging.

Femoral Vein: No evidence of thrombus. Normal compressibility,
respiratory phasicity and response to augmentation.

Popliteal Vein: No evidence of thrombus. Normal compressibility,
respiratory phasicity and response to augmentation.

Calf Veins: No evidence of thrombus. Normal compressibility and flow
on color Doppler imaging.

Superficial Great Saphenous Vein: No evidence of thrombus. Normal
compressibility.

Venous Reflux:  None.

Other Findings:  None.
IMPRESSION: No evidence of DVT within either lower extremity.

## 2020-08-05 ENCOUNTER — Ambulatory Visit: Payer: Medicare Other | Admitting: Podiatry

## 2020-08-12 ENCOUNTER — Ambulatory Visit: Payer: Medicare Other | Admitting: Podiatry

## 2020-08-18 ENCOUNTER — Inpatient Hospital Stay (HOSPITAL_COMMUNITY)
Admission: EM | Admit: 2020-08-18 | Discharge: 2020-08-24 | DRG: 291 | Disposition: A | Discharge status: Home Health | Payer: No Typology Code available for payment source | Attending: Internal Medicine | Admitting: Internal Medicine

## 2020-08-18 ENCOUNTER — Encounter (HOSPITAL_COMMUNITY): Payer: Self-pay | Admitting: Emergency Medicine

## 2020-08-18 ENCOUNTER — Emergency Department (HOSPITAL_COMMUNITY): Payer: No Typology Code available for payment source

## 2020-08-18 ENCOUNTER — Other Ambulatory Visit: Payer: Self-pay

## 2020-08-18 DIAGNOSIS — E7800 Pure hypercholesterolemia, unspecified: Secondary | ICD-10-CM | POA: Diagnosis present

## 2020-08-18 DIAGNOSIS — Z86711 Personal history of pulmonary embolism: Secondary | ICD-10-CM

## 2020-08-18 DIAGNOSIS — Z66 Do not resuscitate: Secondary | ICD-10-CM | POA: Diagnosis not present

## 2020-08-18 DIAGNOSIS — R079 Chest pain, unspecified: Secondary | ICD-10-CM

## 2020-08-18 DIAGNOSIS — I469 Cardiac arrest, cause unspecified: Secondary | ICD-10-CM | POA: Diagnosis not present

## 2020-08-18 DIAGNOSIS — Z8249 Family history of ischemic heart disease and other diseases of the circulatory system: Secondary | ICD-10-CM

## 2020-08-18 DIAGNOSIS — N1832 Chronic kidney disease, stage 3b: Secondary | ICD-10-CM | POA: Diagnosis not present

## 2020-08-18 DIAGNOSIS — I200 Unstable angina: Secondary | ICD-10-CM

## 2020-08-18 DIAGNOSIS — I272 Pulmonary hypertension, unspecified: Secondary | ICD-10-CM

## 2020-08-18 DIAGNOSIS — I499 Cardiac arrhythmia, unspecified: Secondary | ICD-10-CM | POA: Diagnosis not present

## 2020-08-18 DIAGNOSIS — R9431 Abnormal electrocardiogram [ECG] [EKG]: Secondary | ICD-10-CM | POA: Diagnosis not present

## 2020-08-18 DIAGNOSIS — Z841 Family history of disorders of kidney and ureter: Secondary | ICD-10-CM

## 2020-08-18 DIAGNOSIS — I2729 Other secondary pulmonary hypertension: Secondary | ICD-10-CM | POA: Diagnosis present

## 2020-08-18 DIAGNOSIS — I5033 Acute on chronic diastolic (congestive) heart failure: Secondary | ICD-10-CM | POA: Diagnosis present

## 2020-08-18 DIAGNOSIS — I440 Atrioventricular block, first degree: Secondary | ICD-10-CM | POA: Diagnosis not present

## 2020-08-18 DIAGNOSIS — Z9861 Coronary angioplasty status: Secondary | ICD-10-CM

## 2020-08-18 DIAGNOSIS — J849 Interstitial pulmonary disease, unspecified: Secondary | ICD-10-CM | POA: Diagnosis present

## 2020-08-18 DIAGNOSIS — J9611 Chronic respiratory failure with hypoxia: Secondary | ICD-10-CM | POA: Diagnosis present

## 2020-08-18 DIAGNOSIS — Z79899 Other long term (current) drug therapy: Secondary | ICD-10-CM

## 2020-08-18 DIAGNOSIS — I2510 Atherosclerotic heart disease of native coronary artery without angina pectoris: Secondary | ICD-10-CM

## 2020-08-18 DIAGNOSIS — Z888 Allergy status to other drugs, medicaments and biological substances status: Secondary | ICD-10-CM

## 2020-08-18 DIAGNOSIS — I130 Hypertensive heart and chronic kidney disease with heart failure and stage 1 through stage 4 chronic kidney disease, or unspecified chronic kidney disease: Principal | ICD-10-CM | POA: Diagnosis present

## 2020-08-18 DIAGNOSIS — I25110 Atherosclerotic heart disease of native coronary artery with unstable angina pectoris: Secondary | ICD-10-CM | POA: Diagnosis present

## 2020-08-18 DIAGNOSIS — W19XXXA Unspecified fall, initial encounter: Secondary | ICD-10-CM

## 2020-08-18 DIAGNOSIS — Z7982 Long term (current) use of aspirin: Secondary | ICD-10-CM

## 2020-08-18 DIAGNOSIS — E785 Hyperlipidemia, unspecified: Secondary | ICD-10-CM | POA: Diagnosis present

## 2020-08-18 DIAGNOSIS — I252 Old myocardial infarction: Secondary | ICD-10-CM

## 2020-08-18 DIAGNOSIS — H9190 Unspecified hearing loss, unspecified ear: Secondary | ICD-10-CM | POA: Diagnosis present

## 2020-08-18 DIAGNOSIS — I509 Heart failure, unspecified: Secondary | ICD-10-CM

## 2020-08-18 DIAGNOSIS — Y92009 Unspecified place in unspecified non-institutional (private) residence as the place of occurrence of the external cause: Secondary | ICD-10-CM

## 2020-08-18 DIAGNOSIS — L8990 Pressure ulcer of unspecified site, unspecified stage: Secondary | ICD-10-CM | POA: Insufficient documentation

## 2020-08-18 DIAGNOSIS — N183 Chronic kidney disease, stage 3 unspecified: Secondary | ICD-10-CM | POA: Diagnosis present

## 2020-08-18 DIAGNOSIS — I444 Left anterior fascicular block: Secondary | ICD-10-CM | POA: Diagnosis present

## 2020-08-18 DIAGNOSIS — I472 Ventricular tachycardia: Secondary | ICD-10-CM | POA: Diagnosis not present

## 2020-08-18 DIAGNOSIS — D649 Anemia, unspecified: Secondary | ICD-10-CM

## 2020-08-18 DIAGNOSIS — D539 Nutritional anemia, unspecified: Secondary | ICD-10-CM | POA: Diagnosis present

## 2020-08-18 DIAGNOSIS — I441 Atrioventricular block, second degree: Secondary | ICD-10-CM | POA: Diagnosis not present

## 2020-08-18 DIAGNOSIS — Z743 Need for continuous supervision: Secondary | ICD-10-CM | POA: Diagnosis not present

## 2020-08-18 DIAGNOSIS — G459 Transient cerebral ischemic attack, unspecified: Secondary | ICD-10-CM | POA: Diagnosis not present

## 2020-08-18 DIAGNOSIS — Z20822 Contact with and (suspected) exposure to covid-19: Secondary | ICD-10-CM | POA: Diagnosis present

## 2020-08-18 DIAGNOSIS — Z9981 Dependence on supplemental oxygen: Secondary | ICD-10-CM

## 2020-08-18 DIAGNOSIS — M1990 Unspecified osteoarthritis, unspecified site: Secondary | ICD-10-CM | POA: Diagnosis present

## 2020-08-18 DIAGNOSIS — R55 Syncope and collapse: Secondary | ICD-10-CM

## 2020-08-18 DIAGNOSIS — K219 Gastro-esophageal reflux disease without esophagitis: Secondary | ICD-10-CM | POA: Diagnosis present

## 2020-08-18 DIAGNOSIS — I5032 Chronic diastolic (congestive) heart failure: Secondary | ICD-10-CM | POA: Diagnosis not present

## 2020-08-18 DIAGNOSIS — R0789 Other chest pain: Secondary | ICD-10-CM | POA: Diagnosis not present

## 2020-08-18 DIAGNOSIS — R7989 Other specified abnormal findings of blood chemistry: Secondary | ICD-10-CM

## 2020-08-18 DIAGNOSIS — I5030 Unspecified diastolic (congestive) heart failure: Secondary | ICD-10-CM

## 2020-08-18 DIAGNOSIS — L89151 Pressure ulcer of sacral region, stage 1: Secondary | ICD-10-CM | POA: Diagnosis present

## 2020-08-18 LAB — BASIC METABOLIC PANEL
Anion gap: 8 (ref 5–15)
BUN: 25 mg/dL — ABNORMAL HIGH (ref 8–23)
CO2: 29 mmol/L (ref 22–32)
Calcium: 8.9 mg/dL (ref 8.9–10.3)
Chloride: 105 mmol/L (ref 98–111)
Creatinine, Ser: 1.47 mg/dL — ABNORMAL HIGH (ref 0.61–1.24)
GFR, Estimated: 43 mL/min — ABNORMAL LOW (ref >60)
Glucose, Bld: 114 mg/dL — ABNORMAL HIGH (ref 70–99)
Potassium: 4.7 mmol/L (ref 3.5–5.1)
Sodium: 142 mmol/L (ref 135–145)

## 2020-08-18 LAB — CBC
HCT: 37.3 % — ABNORMAL LOW (ref 39.0–52.0)
Hemoglobin: 10.8 g/dL — ABNORMAL LOW (ref 13.0–17.0)
MCH: 30.3 pg (ref 26.0–34.0)
MCHC: 29 g/dL — ABNORMAL LOW (ref 30.0–36.0)
MCV: 104.8 fL — ABNORMAL HIGH (ref 80.0–100.0)
Platelets: 126 10*3/uL — ABNORMAL LOW (ref 150–400)
RBC: 3.56 MIL/uL — ABNORMAL LOW (ref 4.22–5.81)
RDW: 13.3 % (ref 11.5–15.5)
WBC: 8.5 10*3/uL (ref 4.0–10.5)
nRBC: 0 % (ref 0.0–0.2)

## 2020-08-18 LAB — RESP PANEL BY RT-PCR (FLU A&B, COVID) ARPGX2
Influenza A by PCR: NEGATIVE
Influenza B by PCR: NEGATIVE
SARS Coronavirus 2 by RT PCR: NEGATIVE

## 2020-08-18 LAB — CBG MONITORING, ED: Glucose-Capillary: 91 mg/dL (ref 70–99)

## 2020-08-18 LAB — D-DIMER, QUANTITATIVE: D-Dimer, Quant: 3.61 ug/mL-FEU — ABNORMAL HIGH (ref 0.00–0.50)

## 2020-08-18 LAB — TROPONIN I (HIGH SENSITIVITY)
Troponin I (High Sensitivity): 41 ng/L — ABNORMAL HIGH (ref <18)
Troponin I (High Sensitivity): 62 ng/L — ABNORMAL HIGH (ref <18)

## 2020-08-18 MED ORDER — MORPHINE SULFATE (PF) 4 MG/ML IV SOLN
4.0000 mg | Freq: Once | INTRAVENOUS | Status: DC
  Filled 2020-08-18 (×2): qty 1

## 2020-08-18 MED ORDER — NITROGLYCERIN 0.4 MG SL SUBL
0.4000 mg | SUBLINGUAL_TABLET | SUBLINGUAL | Status: AC | PRN
  Administered 2020-08-18 – 2020-08-23 (×3): 0.4 mg via SUBLINGUAL
  Filled 2020-08-18 (×3): qty 1

## 2020-08-18 MED ORDER — ASPIRIN 81 MG PO CHEW
324.0000 mg | CHEWABLE_TABLET | Freq: Once | ORAL | Status: AC
  Administered 2020-08-18: 324 mg via ORAL
  Filled 2020-08-18: qty 4

## 2020-08-18 MED ORDER — IOHEXOL 350 MG/ML SOLN
75.0000 mL | Freq: Once | INTRAVENOUS | Status: AC | PRN
  Administered 2020-08-18: 75 mL via INTRAVENOUS

## 2020-08-18 MED ORDER — ENOXAPARIN SODIUM 30 MG/0.3ML IJ SOSY
30.0000 mg | PREFILLED_SYRINGE | INTRAMUSCULAR | Status: DC
  Administered 2020-08-19 – 2020-08-24 (×6): 30 mg via SUBCUTANEOUS
  Filled 2020-08-18 (×6): qty 0.3

## 2020-08-18 NOTE — ED Notes (Signed)
Pt cleaned. New brief applied.   Straight cath was attempted, however, d/t anatomical differences, I was unable to obtain a specimen. Pt was placed on male-wick. Will get urine sample from that when pt voids again.  Pt repositioned in bed for comfort and provided with multiple warm blankets. Pt denied any other needs at this time. Call bell within reach, bed in low position. Will continue to monitor.

## 2020-08-18 NOTE — H&P (Addendum)
History and Physical  Anthony Galvan [id number] DOB: 11-17-1921 DOA: 08/18/2020  Referring physician: Dorie Rank PCP: [facility] [place]  Patient coming from: Home  Chief Complaint: Chest pain  HPI: Anthony Galvan is a 87 y.o. male with medical history significant for CAD s/p stent placement, bronchiectasis, interstitial lung disease on home oxygen of 4 LPM via [place], duodenal ulcer, GERD, H. pylori gastritis, internal hemorrhoids, hyperlipidemia, CAD, chronic diastolic heart failure, pulmonary hypertension, history of pulmonary embolism who presents to the emergency department due to chest pain which started shortly prior to arrival to the ED.  Chest pain was midsternal to left-sided chest area and it was described as pressure like.  He states that he was getting into the car to go to doctor's appointment at [facility] and as he was pulling on the handle to get into the car, he ended up falling and passed out, he states that the next thing he remembered was being taken to the hospital.  Part of the history was obtained from ED physician and ED medical record.  Per report, patient was initially confused and complaining of some shoulder pain and also appeared diaphoretic.  EMS was activated and he was taken to the ED for further evaluation and management.  At baseline, patient ambulates with a wheelchair, a requires help from daughter for bathing and changing his diaper.  ED Course:  In the emergency department, he was hemodynamically stable.  Work-up in the ED showed Macrocytic anemia,, BUN to creatinine 25/1.47 (creatinine within baseline range), troponin x2- 41 > 62.  D-dimer 3.61, urinalysis was unimpressive for UTI.  Influenza A, B and SARS coronavirus 2 was negative.  CT angiography of chest with contrast showed no evidence of pulmonary embolus but showed small bilateral pleural effusions ( L > R ), cardiomegaly with evidence of right heart failure and trace pericardial effusion CT head without  contrast showed no acute intracranial normality Chest x-ray showed peripheral and basilar interstitial lung disease and possible acute superimposed infiltrates at the right base and periphery of left lung, question atypical or viral process. Aspirin was given, chest pain resolved after nitroglycerin was given.  Hospitalist was asked to admit patient for further evaluation and management.  Review of Systems: Constitutional: Negative for chills and fever.  HENT: Negative for ear pain and sore throat.   Eyes: Negative for pain and visual disturbance.  Respiratory: Negative for cough, chest tightness and shortness of breath.   Cardiovascular: Negative for chest pain and palpitations.  Gastrointestinal: Negative for abdominal pain and vomiting.  Endocrine: Negative for polyphagia and polyuria.  Genitourinary: Negative for decreased urine volume, dysuria Musculoskeletal: Negative for arthralgias and back pain.  Skin: Negative for color change and rash.  Allergic/Immunologic: Negative for immunocompromised state.  Neurological: Negative for tremors, syncope, speech difficulty Hematological: Does not bruise/bleed easily.  All other systems reviewed and are negative   Past Medical History:  Diagnosis Date  . Bronchiectasis ([place]) 06/13/2018  . CAD (coronary artery disease) 08/01/2012   BMS mid LAD; catheterization in July 2018 showed with severe heavily calcified distal circumflex-left PDA stenosis, not favorable for PCI.  Marland Kitchen Congestive heart failure (CHF) ([place])   . Duodenal ulcer May 2011   on EGD  . Essential hypertension   . GERD (gastroesophageal reflux disease)   . Helicobacter pylori gastritis 2012  . Hyperlipidemia   . ILD (interstitial lung disease) ([place])   . MI (myocardial infarction) ([place]) 1976, 1980, [place] [facility] Iona Beard[facility]  . Osteoarthritis   . Pulmonary embolism ([place])   . Pulmonary hypertension ([place])   . S/P colonoscopy 2011    normal, internal hemorrhoids   Past Surgical History:  Procedure Laterality Date  . CARDIAC CATHETERIZATION  01/30/2007   D1 75 %, mid LAD 50%, 50-70% circumflex, 50-70% RCA.(Dr. Adora Fridge)  . CARDIAC CATHETERIZATION  12/11/2001   normal L main, small RCA, dominant LL Cfx with mild diffuse disease, LAD with mid 10-20% stenosis, ramus intermedius with mild diffuse disease (Dr. Jackie Plum)  . CATARACT EXTRACTION    . COLONOSCOPY  OCT 2011 ARS   [facility] [place]   in setting of MI  . ESOPHAGOGASTRODUODENOSCOPY N/A 10/09/2016   Procedure: ESOPHAGOGASTRODUODENOSCOPY (EGD);  Surgeon: Danie Binder, MD;  [facility];  Service: Endoscopy;  Laterality: N/A;  . FEMUR IM NAIL Right 12/01/2016   Procedure: INTRAMEDULLARY (IM) NAIL RIGHT HIP;  Surgeon: Altamese Octa, MD;  Location: [place];  Service: Orthopedics;  Laterality: Right;  . FEMUR IM NAIL Left 05/07/2018   Procedure: INTRAMEDULLARY (IM) NAIL FEMORAL;  Surgeon: Nicholes Stairs, MD;  Location: [place];  Service: Orthopedics;  Laterality: Left;  . LEFT HEART CATH AND CORONARY ANGIOGRAPHY N/A 10/23/2016   Procedure: Left Heart Cath and Coronary Angiography;  Surgeon: Sherren Mocha, MD;  Location: [place] CV LAB;  Service: Cardiovascular: Two-vessel CAD (left dominant).  Patent BMS in p LAD.  Severe, heavily calcified dCx-LPDA lesion.  Not favorable for PCI.  Marland Kitchen LEFT HEART CATHETERIZATION WITH CORONARY ANGIOGRAM N/A 08/01/2012   Procedure: LEFT HEART CATHETERIZATION WITH CORONARY ANGIOGRAM;  Surgeon: Leonie Man, MD;  Location: [facility] CATH LAB: Mid LAD 99% apple core; D2 ostial 70-80% (2 small for PCI) small nondominant RCA 60-70%. Distal circumflex/L PDA ~50% --> PCI of LAD  . NM MYOCAR PERF WALL MOTION  08/2003   adenosine stress - focal decreased perfusion defect in distal inferior wall, no significant ischemic changes  . PERCUTANEOUS STENT INTERVENTION  08/01/2012   Procedure: PERCUTANEOUS STENT  INTERVENTION;  Surgeon: Leonie Man, MD;  Location: [facility] CATH LAB; ;PCI of mid LAD with a VeriFlex Bare Metal Stent 3.0 mm x 12 mm - post-dilated to 3.5 mm.  . TRANSTHORACIC ECHOCARDIOGRAM  10/2016   mild LVH - 60-65%. Gr 1 DD. No RWMA. Mild MR. Mod TR. Mild RA dilation.  PAP ~ 60 mmHg.  Marland Kitchen UPPER GASTROINTESTINAL ENDOSCOPY  08/2009   MELENA, HEMATEMESIS --> DUODENAL ULCER, Bx: H PYLORI POS    Social History:  reports that he has never smoked. He has never used smokeless tobacco. He reports that he does not drink alcohol and does not use drugs.   Allergies  Allergen Reactions  . Lisinopril Other (See Comments)    "Allergic," per the [facility]- patient is unaware of any reaction (??)    Family History  Problem Relation Age of Onset  . Heart disease Mother   . Kidney disease Father   . Heart disease Brother 6  . Heart disease Sister   . Colon cancer Neg Hx       Prior to Admission medications   Medication Sig Start Date End Date Taking? Authorizing Provider  acetaminophen (TYLENOL) 325 MG tablet Take 2 tablets (650 mg total) by mouth 3 (three) times daily as needed (pain). 12/04/16   Ainsley Spinner, PA-C  alum & mag hydroxide-simeth (MAALOX/MYLANTA) 200-200-20 MG/5ML suspension Take 30 mLs by mouth every  4 (four) hours as needed for indigestion. 01/29/19   Johnson, Clanford L, MD  aspirin EC 81 MG tablet Take 1 tablet (81 mg total) by mouth daily. Swallow whole. 10/01/19   Strader, Fransisco Hertz, PA-C  atorvastatin (LIPITOR) 80 MG tablet Take 1 tablet (80 mg total) by mouth daily at 6 PM. Patient taking differently: Take 40 mg by mouth daily at 6 PM. 08/02/12   Brett Canales, PA-C  calcium carbonate (OS-CAL - DOSED IN MG OF ELEMENTAL CALCIUM) 1250 (500 Ca) MG tablet Take 1 tablet (500 mg of elemental calcium total) by mouth daily with breakfast. 05/13/18   Mullis, Kiersten P, DO  donepezil (ARICEPT) 10 MG tablet Take 10 mg by mouth daily.    [provider]  furosemide (LASIX) 40 MG  tablet Take 1.5 tablets (60 mg total) by mouth daily. Patient taking differently: Take 40 mg by mouth daily. 10/01/19   Strader, Fransisco Hertz, PA-C  guaiFENesin (MUCINEX) 600 MG 12 hr tablet Take 1 tablet (600 mg total) by mouth 2 (two) times daily. 06/15/18   Kathie Dike, MD  ipratropium-albuterol (DUONEB) 0.5-2.5 (3) MG/3ML SOLN Take 3 mLs by nebulization 3 (three) times daily. 01/29/19   Johnson, Clanford L, MD  isosorbide mononitrate (IMDUR) 60 MG 24 hr tablet Take 1 tablet (60 mg total) by mouth daily. 04/20/20   Geradine Girt, DO  metoprolol tartrate (LOPRESSOR) 25 MG tablet Take 0.5 tablets (12.5 mg total) by mouth 2 (two) times daily. 05/13/18   Mullis, Kiersten P, DO  Multiple Vitamin (MULTIVITAMIN WITH MINERALS) TABS tablet Take 1 tablet by mouth daily.    [provider]  NITROSTAT 0.4 MG SL tablet PLACE 1 TABLET UNDER TONGUE EVERY 5 MINUTES FOR 3 DOSES AS NEEDED. Patient taking differently: Place 0.4 mg under the tongue every 5 (five) minutes as needed for chest pain. 11/17/13   Leonie Man, MD  pantoprazole (PROTONIX) 40 MG tablet Take 1 tablet (40 mg total) by mouth daily. 01/30/19   Johnson, Clanford L, MD  polyethylene glycol (MIRALAX / GLYCOLAX) 17 g packet Take 17 g by mouth as needed. 04/19/20   Geradine Girt, DO  Vitamin D, Ergocalciferol, (DRISDOL) 1.25 MG (50000 UT) CAPS capsule Take 1 capsule (50,000 Units total) by mouth every 7 (seven) days. 05/16/18   Mullis, Archie Endo, DO    Physical Exam: BP 135/79 (BP Location: Right Arm)   Pulse 75   Temp 97.7 F (36.5 C) (Oral)   Resp 11   Ht '5\' 4"'$  (1.626 m)   Wt 63.5 kg   SpO2 100%   BMI 24.03 kg/m   . General: 87 y.o. year-old male well developed well nourished in no acute distress.  Alert and oriented x3. Marland Kitchen HEENT: NCAT, EOMI . Neck: Supple, trachea medial . Cardiovascular: Regular rate and rhythm with no rubs or gallops.  No thyromegaly or JVD noted.   2/4 pulses in all 4 extremities. Marland Kitchen Respiratory: Clear to  auscultation with no wheezes or rales. Good inspiratory effort. . Abdomen: Soft, nontender nondistended with normal bowel sounds x4 quadrants. . Muskuloskeletal: +2 edema noted bilaterally in lower extremities.  No cyanosis or clubbing  noted bilaterally . Neuro: CN II-XII intact,  sensation, reflexes intact . Skin: No ulcerative lesions noted or rashes . Psychiatry: Judgement and insight appear normal. Mood is appropriate for condition and setting          Labs on Admission:  Basic Metabolic Panel: Recent Labs  Lab 08/18/20 1742  NA 142  K 4.7  CL 105  CO2 29  GLUCOSE 114*  BUN 25*  CREATININE 1.47*  CALCIUM 8.9   Liver Function Tests: No results for input(s): AST, ALT, ALKPHOS, BILITOT, PROT, ALBUMIN in the last 168 hours. No results for input(s): LIPASE, AMYLASE in the last 168 hours. No results for input(s): AMMONIA in the last 168 hours. CBC: Recent Labs  Lab 08/18/20 1742 08/19/20 0340  WBC 8.5 5.8  HGB 10.8* 6.7*  HCT 37.3* 23.9*  MCV 104.8* 107.2*  PLT 126* 67*   Cardiac Enzymes: No results for input(s): CKTOTAL, CKMB, CKMBINDEX, TROPONINI in the last 168 hours.  BNP (last 3 results) Recent Labs    04/12/20 1722  BNP 437.0*    ProBNP (last 3 results) No results for input(s): PROBNP in the last 8760 hours.  CBG: Recent Labs  Lab 08/18/20 1707  GLUCAP 91    Radiological Exams on Admission: DG Chest 1 View  Result Date: 08/18/2020 CLINICAL DATA:  Near syncopal episode chest pain EXAM: CHEST  1 VIEW COMPARISON:  04/13/2020, CT chest 01/25/2019 FINDINGS: Peripheral and basilar reticular opacity and ground-glass changes, likely corresponding to interstitial lung disease on prior CT. Possible superimposed acute airspace disease at the right base and periphery of the left lower lung compared to prior. Mild cardiomegaly with aortic atherosclerosis. No pneumothorax IMPRESSION: 1. Peripheral and basilar interstitial lung disease. Possible acute superimposed  infiltrates at the right base and periphery of left lung, question atypical or viral process 2. Cardiomegaly Electronically Signed   By: Donavan Foil M.D.   On: 08/18/2020 17:28   CT Head Wo Contrast  Result Date: 08/18/2020 CLINICAL DATA:  Near syncopal episode EXAM: CT HEAD WITHOUT CONTRAST TECHNIQUE: Contiguous axial images were obtained from the base of the skull through the vertex without intravenous contrast. COMPARISON:  CT brain 04/14/2019 FINDINGS: Brain: No acute territorial infarction, hemorrhage or intracranial mass. Moderate atrophy and chronic small vessel ischemic changes of the white matter. Chronic appearing lacunar infarcts or small vessel disease in the right basal ganglia and left thalamus. Stable ventricle size Vascular: No hyperdense vessels.  Carotid vascular calcification Skull: Normal. Negative for fracture or focal lesion. Sinuses/Orbits: Patchy mucosal thickening in left ethmoid sinus Other: None IMPRESSION: 1. No CT evidence for acute intracranial abnormality. 2. Atrophy and chronic small vessel ischemic change of the white matter. Electronically Signed   By: Donavan Foil M.D.   On: 08/18/2020 17:32   CT Angio Chest PE W and/or Wo Contrast  Result Date: 08/18/2020 CLINICAL DATA:  Centralized chest pain radiating to jaw, positive D dimer EXAM: CT ANGIOGRAPHY CHEST WITH CONTRAST TECHNIQUE: Multidetector CT imaging of the chest was performed using the standard protocol during bolus administration of intravenous contrast. Multiplanar CT image reconstructions and MIPs were obtained to evaluate the vascular anatomy. CONTRAST:  18m OMNIPAQUE IOHEXOL 350 MG/ML SOLN COMPARISON:  08/18/2020, 01/25/2019 FINDINGS: Cardiovascular: This is a technically adequate evaluation of the pulmonary vasculature. No filling defects or pulmonary emboli. The heart is enlarged, marked right atrial dilatation. Reflux of contrast into the hepatic veins may reflect an element of cardiac dysfunction. Trace  pericardial effusion. Normal caliber of the thoracic aorta. Diffuse atherosclerosis of the aorta and coronary vasculature. Mediastinum/Nodes: No enlarged mediastinal, hilar, or axillary lymph nodes. Thyroid gland, trachea, and esophagus demonstrate no significant findings. Lungs/Pleura: Persistent bibasilar scarring and fibrosis. There are small bilateral pleural effusions, left greater than right. Chronic bilateral interlobular septal thickening again noted, with relative sparing of the  left upper lobe, most consistent with chronic interstitial lung disease. Denser areas of consolidation within the lung bases likely reflect atelectasis. Bilateral lower lobe bronchiectasis unchanged. Central airways are patent. Upper Abdomen: Small volume ascites upper abdomen. No acute findings otherwise. Musculoskeletal: There is diffuse body wall edema. No acute or destructive bony lesions. Reconstructed images demonstrate no additional findings. Review of the MIP images confirms the above findings. IMPRESSION: 1. No evidence of pulmonary embolus. 2. Small bilateral pleural effusions, left greater than right. 3. Chronic interstitial lung disease, with diffuse interlobular septal thickening as well as basilar predominant scarring and fibrosis. 4. Patchy bibasilar atelectasis. 5. Cardiomegaly, with evidence of right heart failure. Trace pericardial effusion. 6. Aortic Atherosclerosis (ICD10-I70.0). Coronary artery atherosclerosis. Electronically Signed   By: Randa Ngo M.D.   On: 08/18/2020 20:10    EKG: I independently viewed the EKG done and my findings are as followed: Normal sinus rhythm at a rate of 81 bpm with borderline prolonged QTc (481 ms)  Assessment/Plan Present on Admission: . Chest pain . CKD (chronic kidney disease) stage 3, GFR 30-59 ml/min (HCC) . Pulmonary hypertension ([place]) . GERD . Hyperlipidemia with target low density lipoprotein (LDL) cholesterol less than 70 mg/dL . Interstitial lung disease  (HCC)  Principal Problem:   Chest pain Active Problems:   Hyperlipidemia with target low density lipoprotein (LDL) cholesterol less than 70 mg/dL   GERD   CAD S/P percutaneous coronary angioplasty   CKD (chronic kidney disease) stage 3, GFR 30-59 ml/min (HCC)   Interstitial lung disease ([place])   Pulmonary hypertension ([employer])   Fall at home, initial encounter   Syncope   Macrocytic anemia   Symptomatic anemia   Elevated d-dimer   (HFpEF) heart failure with preserved ejection fraction (HCC)   Chest pain rule out ACS  Continue telemetry Troponins - 41 > 62 EKG personally reviewed showed normal sinus rhythm at a rate of 81 bpm with borderline prolonged QTc (481 ms) Cardiology will be consulted to help decide if Stress test is needed in am Versus other diagnostic modalities.  Continue aspirin, nitroglycerin  Fall Continue fall precaution and neurochecks Continue PT/OT eval and treat  Acute syncopal episode Continue telemetry monitoring and watch for arrhythmias Troponins  41 > 61 EKG showed normal sinus rhythm at a rate of 81 bpm Echocardiogram done on 04/05/2020 showed LVEF of 65 to 70%.  Repeated echocardiogram will be done in the morning to rule out significant aortic stenosis or other outflow obstruction, and also to evaluate EF and to rule out segmental/Regional wall motion abnormalities. Carotid artery Dopplers will be done in the morning to rule out hemodynamically significant stenosis  Macrocytic anemia/symptomatic anemia MCV 104.8; H/H10.8/31.3, this has decreased to 6.7/23.9 with MCV at 107.2 since to the ED FOBT pending Repeat H/H and type and screen pending. Continue to monitor patient  Prolonged QTc (481 ms) Avoid QT prolonging drugs Magnesium level will be checked  Questionable acute airspace disease Chest x-ray showed possible superimposed acute airspace disease at the right base and periphery of the left lower lung compared to prior Patient had no fever,  cough, increased shortness of breath, elevated WBC Procalcitonin will be checked prior to starting antibiotics.  Chronic hypoxic respiratory failure secondary to chronic interstitial lung disease Stable, continue supplemental oxygen per home regimen Continue DuoNeb and Mucinex  GERD Continue Protonix  CKD stage IIIb BUN to creatinine 25/1.47 (creatinine within baseline range) Renally adjust medications, avoid nephrotoxic agents/dehydration/hypotension  CAD s/p stent placement Patient is s/p  BMS to LAD (2014) with repeat cath in 10/2016 showing patent stent and 80% distal LCx stenosis not suitable for PCI and medical management was recommended.  Continue Lipitor, aspirin, Imdur  Elevated D-dimer CT angiography of chest showed cardiomegaly with evidence of right heart failure no pulmonary embolus  HFpEF/Pulmonary hypertension Echocardiogram on 04/05/2020 showed EF of 65 to 70% and grade 1 diastolic dysfunction with normal significant regional wall motion abnormalities.  Pulmonary hypertension is possibly due to patient's underlying interstitial lung disease Continue Lasix, Lipitor, aspirin   DVT prophylaxis: Lovenox  Code Status: Full code  Family Communication: None at bedside  Disposition Plan:  Patient is from:                        home Anticipated DC to:                   SNF or family members home Anticipated DC date:               2-3 days Anticipated DC barriers:           Patient requires inpatient management for syncopal work-up as well as symptomatic anemia    Consults called: None  Admission status: Observation     Bernadette Hoit MD [facility]  08/19/2020, 5:18 AM

## 2020-08-18 NOTE — ED Triage Notes (Signed)
Near syncopal episode while getting into car with daughter to go to appt. Became diaphoretic, altered, and c/o L shoulder pain so called EMS. On the way to hospital pt reported substernal chest pain radiating to jaw.

## 2020-08-18 NOTE — ED Notes (Signed)
labwork drawn off established line at 15:30

## 2020-08-18 NOTE — ED Provider Notes (Signed)
[facility] EMERGENCY DEPARTMENT Provider Note   CSN: [id number] Arrival date & time: 08/18/20  1501     History Chief Complaint  Patient presents with  . Chest Pain    Anthony Galvan is a 87 y.o. male.  HPI  HPI: A 86 year old patient with a history of hypercholesterolemia presents for evaluation of chest pain. Initial onset of pain was less than one hour ago. The patient's chest pain is described as heaviness/pressure/tightness and is not worse with exertion. The patient reports some diaphoresis. The patient's chest pain is middle- or left-sided, is not well-localized, is not sharp and does radiate to the arms/jaw/neck. The patient does not complain of nausea. The patient has no history of stroke, has no history of peripheral artery disease, has not smoked in the past 90 days, denies any history of treated diabetes, has no relevant family history of coronary artery disease (first degree relative at less than age 81), is not hypertensive and does not have an elevated BMI (>=30). Patient presents to the ED for evaluation of a syncopal episode and chest discomfort.  Patient states at baseline he is not able to walk and has to use a wheelchair.  He was getting into the car to go to a doctor's appointment at the [place].  As he was pulling on the handle to get himself into the car he ended up falling and passed out.  According to family members initially the patient was confused and started to complain of some shoulder pain and appeared diaphoretic.  EMS was called.  Patient states he has been having some discomfort in the anterior part of his chest.  It does go up towards his neck.  He is not having any fevers or chills.  He is not having any neck pain or headache.  He denies any focal numbness or weakness.  Patient states at baseline his daughter has to help him with bathing as well as changing his diaper.  He is not able to walk and has to use a wheelchair  Past Medical History:  Diagnosis Date  .  Bronchiectasis ([place]) 06/13/2018  . CAD (coronary artery disease) 08/01/2012   BMS mid LAD; catheterization in July 2018 showed with severe heavily calcified distal circumflex-left PDA stenosis, not favorable for PCI.  Marland Kitchen Congestive heart failure (CHF) ([place])   . Duodenal ulcer May 2011   on EGD  . Essential hypertension   . GERD (gastroesophageal reflux disease)   . Helicobacter pylori gastritis 2012  . Hyperlipidemia   . ILD (interstitial lung disease) ([place])   . MI (myocardial infarction) ([place]) 1976, 1980, [place] for at [facility] and [place] (Dr. Iona Beard)  . Osteoarthritis   . Pulmonary embolism ([place])   . Pulmonary hypertension ([place])   . S/P colonoscopy 2011   normal, internal hemorrhoids    Patient Active Problem List   Diagnosis Date Noted  . Hyperbilirubinemia 04/13/2020  . CKD (chronic kidney disease), stage V ([place]) 04/13/2020  . NSTEMI (non-ST elevated myocardial infarction) ([place]) 04/13/2020  . Acute coronary syndrome with high troponin ([place]) 04/12/2020  . Constipation 05/29/2019  . Acute on chronic respiratory failure ([place])   . Cor pulmonale (chronic) ([place]) 01/26/2019  . Chronic respiratory failure with hypoxia ([place]) 01/25/2019  . Bronchiectasis ([place]) 01/25/2019  . Acute CHF (congestive heart failure) ([place]) 01/24/2019  . Normocytic anemia 01/24/2019  . Thrombocytopenia ([place]) 01/24/2019  . Interstitial lung disease ([place]) 06/15/2018  . Pulmonary hypertension ([employer]) 06/15/2018  .  Acute pulmonary embolus ([place]) 06/14/2018  . Acute on chronic diastolic CHF (congestive heart failure) ([place]) 06/12/2018  . Acute respiratory failure with hypoxia ([place]) 06/12/2018  . Fracture 05/07/2018  . Closed displaced intertrochanteric fracture of left femur ([place])   . Coronary artery disease involving native heart without angina pectoris   . Dyspepsia 01/18/2017  . Closed disp intertrochanteric fracture of right femur with nonunion 11/30/2016  . PUD (peptic ulcer disease)   .  History of coronary artery stent placement 10/08/2016  . Acute on chronic renal failure ([place]) 10/08/2016  . CKD (chronic kidney disease) stage 3, GFR 30-59 ml/min (HCC) 10/08/2016  . Bloating 09/03/2013  . CAD S/P percutaneous coronary angioplasty   . MI (myocardial infarction) ([place])   . Presence of bare metal stent in LAD coronary artery - VerifFlex BMS 3.0 mm x 12 mm - post-dilated to 3.5 mm 08/01/2012  . Unstable angina ([place]) 07/30/2012  . Gastritis 12/15/2010  . Nausea 08/30/2010  . Hyperlipidemia with target low density lipoprotein (LDL) cholesterol less than 70 mg/dL 05/21/2006  . CATARACT NOS 05/21/2006  . Essential hypertension 05/21/2006  . MYOCARDIAL INFARCTION, HX OF 05/21/2006  . GERD 05/21/2006  . OVERACTIVE BLADDER 05/21/2006  . OSTEOARTHRITIS 05/21/2006  . URINARY INCONTINENCE 05/21/2006    Past Surgical History:  Procedure Laterality Date  . CARDIAC CATHETERIZATION  01/30/2007   D1 75 %, mid LAD 50%, 50-70% circumflex, 50-70% RCA.(Dr. Adora Fridge)  . CARDIAC CATHETERIZATION  12/11/2001   normal L main, small RCA, dominant LL Cfx with mild diffuse disease, LAD with mid 10-20% stenosis, ramus intermedius with mild diffuse disease (Dr. Jackie Plum)  . CATARACT EXTRACTION    . COLONOSCOPY  OCT 2011 ARS   [facility] [place]   in setting of MI  . ESOPHAGOGASTRODUODENOSCOPY N/A 10/09/2016   Procedure: ESOPHAGOGASTRODUODENOSCOPY (EGD);  Surgeon: Danie Binder, MD;  [facility];  Service: Endoscopy;  Laterality: N/A;  . FEMUR IM NAIL Right 12/01/2016   Procedure: INTRAMEDULLARY (IM) NAIL RIGHT HIP;  Surgeon: Altamese Couderay, MD;  Location: [place];  Service: Orthopedics;  Laterality: Right;  . FEMUR IM NAIL Left 05/07/2018   Procedure: INTRAMEDULLARY (IM) NAIL FEMORAL;  Surgeon: Nicholes Stairs, MD;  Location: [place];  Service: Orthopedics;  Laterality: Left;  . LEFT HEART CATH AND CORONARY ANGIOGRAPHY N/A 10/23/2016   Procedure: Left Heart  Cath and Coronary Angiography;  Surgeon: Sherren Mocha, MD;  Location: [place] CV LAB;  Service: Cardiovascular: Two-vessel CAD (left dominant).  Patent BMS in p LAD.  Severe, heavily calcified dCx-LPDA lesion.  Not favorable for PCI.  Marland Kitchen LEFT HEART CATHETERIZATION WITH CORONARY ANGIOGRAM N/A 08/01/2012   Procedure: LEFT HEART CATHETERIZATION WITH CORONARY ANGIOGRAM;  Surgeon: Leonie Man, MD;  Location: [facility] CATH LAB: Mid LAD 99% apple core; D2 ostial 70-80% (2 small for PCI) small nondominant RCA 60-70%. Distal circumflex/L PDA ~50% --> PCI of LAD  . NM MYOCAR PERF WALL MOTION  08/2003   adenosine stress - focal decreased perfusion defect in distal inferior wall, no significant ischemic changes  . PERCUTANEOUS STENT INTERVENTION  08/01/2012   Procedure: PERCUTANEOUS STENT INTERVENTION;  Surgeon: Leonie Man, MD;  Location: [facility] CATH LAB; ;PCI of mid LAD with a VeriFlex Bare Metal Stent 3.0 mm x 12 mm - post-dilated to 3.5 mm.  . TRANSTHORACIC ECHOCARDIOGRAM  10/2016   mild LVH - 60-65%. Gr 1 DD. No RWMA. Mild MR. Mod TR. Mild RA dilation.  PAP ~ 60 mmHg.  Marland Kitchen UPPER GASTROINTESTINAL ENDOSCOPY  08/2009   MELENA, HEMATEMESIS --> DUODENAL ULCER, Bx: H PYLORI POS       Family History  Problem Relation Age of Onset  . Heart disease Mother   . Kidney disease Father   . Heart disease Brother 9  . Heart disease Sister   . Colon cancer Neg Hx     Social History   Tobacco Use  . Smoking status: Never Smoker  . Smokeless tobacco: Never Used  Vaping Use  . Vaping Use: Never used  Substance Use Topics  . Alcohol use: No  . Drug use: No    Home Medications Prior to Admission medications   Medication Sig Start Date End Date Taking? Authorizing Provider  acetaminophen (TYLENOL) 325 MG tablet Take 2 tablets (650 mg total) by mouth 3 (three) times daily as needed (pain). 12/04/16   Ainsley Spinner, PA-C  alum & mag hydroxide-simeth (MAALOX/MYLANTA) 200-200-20 MG/5ML suspension Take 30 mLs by  mouth every 4 (four) hours as needed for indigestion. 01/29/19   Johnson, Clanford L, MD  aspirin EC 81 MG tablet Take 1 tablet (81 mg total) by mouth daily. Swallow whole. 10/01/19   Strader, Fransisco Hertz, PA-C  atorvastatin (LIPITOR) 80 MG tablet Take 1 tablet (80 mg total) by mouth daily at 6 PM. Patient taking differently: Take 40 mg by mouth daily at 6 PM. 08/02/12   Brett Canales, PA-C  calcium carbonate (OS-CAL - DOSED IN MG OF ELEMENTAL CALCIUM) 1250 (500 Ca) MG tablet Take 1 tablet (500 mg of elemental calcium total) by mouth daily with breakfast. 05/13/18   Mullis, Kiersten P, DO  donepezil (ARICEPT) 10 MG tablet Take 10 mg by mouth daily.    [provider]  furosemide (LASIX) 40 MG tablet Take 1.5 tablets (60 mg total) by mouth daily. Patient taking differently: Take 40 mg by mouth daily. 10/01/19   Strader, Fransisco Hertz, PA-C  guaiFENesin (MUCINEX) 600 MG 12 hr tablet Take 1 tablet (600 mg total) by mouth 2 (two) times daily. 06/15/18   Kathie Dike, MD  ipratropium-albuterol (DUONEB) 0.5-2.5 (3) MG/3ML SOLN Take 3 mLs by nebulization 3 (three) times daily. 01/29/19   Johnson, Clanford L, MD  isosorbide mononitrate (IMDUR) 60 MG 24 hr tablet Take 1 tablet (60 mg total) by mouth daily. 04/20/20   Geradine Girt, DO  metoprolol tartrate (LOPRESSOR) 25 MG tablet Take 0.5 tablets (12.5 mg total) by mouth 2 (two) times daily. 05/13/18   Mullis, Kiersten P, DO  Multiple Vitamin (MULTIVITAMIN WITH MINERALS) TABS tablet Take 1 tablet by mouth daily.    [provider]  NITROSTAT 0.4 MG SL tablet PLACE 1 TABLET UNDER TONGUE EVERY 5 MINUTES FOR 3 DOSES AS NEEDED. Patient taking differently: Place 0.4 mg under the tongue every 5 (five) minutes as needed for chest pain. 11/17/13   Leonie Man, MD  pantoprazole (PROTONIX) 40 MG tablet Take 1 tablet (40 mg total) by mouth daily. 01/30/19   Johnson, Clanford L, MD  polyethylene glycol (MIRALAX / GLYCOLAX) 17 g packet Take 17 g by mouth as  needed. 04/19/20   Geradine Girt, DO  Vitamin D, Ergocalciferol, (DRISDOL) 1.25 MG (50000 UT) CAPS capsule Take 1 capsule (50,000 Units total) by mouth every 7 (seven) days. 05/16/18   Mullis, Kiersten P, DO    Allergies    Lisinopril  Review of Systems   Review of Systems  All other systems reviewed and are negative.  Physical Exam Updated Vital Signs BP 133/71 (BP Location: Right Arm)   Pulse 78   Temp 97.7 F (36.5 C) (Oral)   Resp 16   Ht 1.626 m ('5\' 4"'$ )   Wt 63.5 kg   SpO2 100%   BMI 24.03 kg/m   Physical Exam Vitals and nursing note reviewed.  Constitutional:      General: He is not in acute distress.    Appearance: He is well-developed.  HENT:     Head: Normocephalic and atraumatic.     Right Ear: External ear normal.     Left Ear: External ear normal.  Eyes:     General: No scleral icterus.       Right eye: No discharge.        Left eye: No discharge.     Conjunctiva/sclera: Conjunctivae normal.  Neck:     Trachea: No tracheal deviation.  Cardiovascular:     Rate and Rhythm: Normal rate and regular rhythm.  Pulmonary:     Effort: Pulmonary effort is normal. No respiratory distress.     Breath sounds: Normal breath sounds. No stridor. No wheezing or rales.  Abdominal:     General: Bowel sounds are normal. There is no distension.     Palpations: Abdomen is soft.     Tenderness: There is no abdominal tenderness. There is no guarding or rebound.  Musculoskeletal:        General: No tenderness.     Cervical back: Neck supple.  Skin:    General: Skin is warm and dry.     Findings: No rash.  Neurological:     Mental Status: He is alert.     Cranial Nerves: No cranial nerve deficit (no facial droop, extraocular movements intact, no slurred speech).     Sensory: No sensory deficit.     Motor: Weakness present. No abnormal muscle tone or seizure activity.     Coordination: Coordination normal.     Comments: Generalized weakness, lower extrem greater than ue      ED Results / Procedures / Treatments   Labs (all labs ordered are listed, but only abnormal results are displayed) Labs Reviewed  BASIC METABOLIC PANEL - Abnormal; Notable for the following components:      Result Value   Glucose, Bld 114 (*)    BUN 25 (*)    Creatinine, Ser 1.47 (*)    GFR, Estimated 43 (*)    All other components within normal limits  CBC - Abnormal; Notable for the following components:   RBC 3.56 (*)    Hemoglobin 10.8 (*)    HCT 37.3 (*)    MCV 104.8 (*)    MCHC 29.0 (*)    Platelets 126 (*)    All other components within normal limits  D-DIMER, QUANTITATIVE - Abnormal; Notable for the following components:   D-Dimer, Quant 3.61 (*)    All other components within normal limits  TROPONIN I (HIGH SENSITIVITY) - Abnormal; Notable for the following components:   Troponin I (High Sensitivity) 41 (*)    All other components within normal limits  TROPONIN I (HIGH SENSITIVITY) - Abnormal; Notable for the following components:   Troponin I (High Sensitivity) 62 (*)    All other components within normal limits  RESP PANEL BY RT-PCR (FLU A&B, COVID) [id number]  URINALYSIS, ROUTINE W REFLEX MICROSCOPIC  CBG MONITORING, ED    EKG EKG Interpretation  Date/Time:  Wednesday Aug 18 2020 17:06:16 EDT Ventricular Rate:  81 PR  Interval:  211 QRS Duration: 91 QT Interval:  414 QTC Calculation: 481 R Axis:   -61 Text Interpretation: Sinus rhythm LAD, consider LAFB or inferior infarct Borderline low voltage, extremity leads Borderline prolonged QT interval No significant change since last tracing Confirmed by Dorie Rank [phone]) on 08/18/2020 5:17:56 PM   Radiology DG Chest 1 View  Result Date: 08/18/2020 CLINICAL DATA:  Near syncopal episode chest pain EXAM: CHEST  1 VIEW COMPARISON:  04/13/2020, CT chest 01/25/2019 FINDINGS: Peripheral and basilar reticular opacity and ground-glass changes, likely corresponding to interstitial lung disease on prior CT. Possible  superimposed acute airspace disease at the right base and periphery of the left lower lung compared to prior. Mild cardiomegaly with aortic atherosclerosis. No pneumothorax IMPRESSION: 1. Peripheral and basilar interstitial lung disease. Possible acute superimposed infiltrates at the right base and periphery of left lung, question atypical or viral process 2. Cardiomegaly Electronically Signed   By: Donavan Foil M.D.   On: 08/18/2020 17:28   CT Head Wo Contrast  Result Date: 08/18/2020 CLINICAL DATA:  Near syncopal episode EXAM: CT HEAD WITHOUT CONTRAST TECHNIQUE: Contiguous axial images were obtained from the base of the skull through the vertex without intravenous contrast. COMPARISON:  CT brain 04/14/2019 FINDINGS: Brain: No acute territorial infarction, hemorrhage or intracranial mass. Moderate atrophy and chronic small vessel ischemic changes of the white matter. Chronic appearing lacunar infarcts or small vessel disease in the right basal ganglia and left thalamus. Stable ventricle size Vascular: No hyperdense vessels.  Carotid vascular calcification Skull: Normal. Negative for fracture or focal lesion. Sinuses/Orbits: Patchy mucosal thickening in left ethmoid sinus Other: None IMPRESSION: 1. No CT evidence for acute intracranial abnormality. 2. Atrophy and chronic small vessel ischemic change of the white matter. Electronically Signed   By: Donavan Foil M.D.   On: 08/18/2020 17:32   CT Angio Chest PE W and/or Wo Contrast  Result Date: 08/18/2020 CLINICAL DATA:  Centralized chest pain radiating to jaw, positive D dimer EXAM: CT ANGIOGRAPHY CHEST WITH CONTRAST TECHNIQUE: Multidetector CT imaging of the chest was performed using the standard protocol during bolus administration of intravenous contrast. Multiplanar CT image reconstructions and MIPs were obtained to evaluate the vascular anatomy. CONTRAST:  19m OMNIPAQUE IOHEXOL 350 MG/ML SOLN COMPARISON:  08/18/2020, 01/25/2019 FINDINGS: Cardiovascular:  This is a technically adequate evaluation of the pulmonary vasculature. No filling defects or pulmonary emboli. The heart is enlarged, marked right atrial dilatation. Reflux of contrast into the hepatic veins may reflect an element of cardiac dysfunction. Trace pericardial effusion. Normal caliber of the thoracic aorta. Diffuse atherosclerosis of the aorta and coronary vasculature. Mediastinum/Nodes: No enlarged mediastinal, hilar, or axillary lymph nodes. Thyroid gland, trachea, and esophagus demonstrate no significant findings. Lungs/Pleura: Persistent bibasilar scarring and fibrosis. There are small bilateral pleural effusions, left greater than right. Chronic bilateral interlobular septal thickening again noted, with relative sparing of the left upper lobe, most consistent with chronic interstitial lung disease. Denser areas of consolidation within the lung bases likely reflect atelectasis. Bilateral lower lobe bronchiectasis unchanged. Central airways are patent. Upper Abdomen: Small volume ascites upper abdomen. No acute findings otherwise. Musculoskeletal: There is diffuse body wall edema. No acute or destructive bony lesions. Reconstructed images demonstrate no additional findings. Review of the MIP images confirms the above findings. IMPRESSION: 1. No evidence of pulmonary embolus. 2. Small bilateral pleural effusions, left greater than right. 3. Chronic interstitial lung disease, with diffuse interlobular septal thickening as well as basilar predominant scarring and fibrosis. 4. Patchy  bibasilar atelectasis. 5. Cardiomegaly, with evidence of right heart failure. Trace pericardial effusion. 6. Aortic Atherosclerosis (ICD10-I70.0). Coronary artery atherosclerosis. Electronically Signed   By: Randa Ngo M.D.   On: 08/18/2020 20:10    Procedures .Critical Care Performed by: Dorie Rank, MD Authorized by: Dorie Rank, MD   Critical care provider statement:    Critical care time (minutes):  45    Critical care was time spent personally by me on the following activities:  Discussions with consultants, evaluation of patient's response to treatment, examination of patient, ordering and performing treatments and interventions, ordering and review of laboratory studies, ordering and review of radiographic studies, pulse oximetry, re-evaluation of patient's condition, obtaining history from patient or surrogate and review of old charts     Medications Ordered in ED Medications  nitroGLYCERIN (NITROSTAT) SL tablet 0.4 mg (0.4 mg Sublingual Given 08/18/20 1805)  morphine 4 MG/ML injection 4 mg (4 mg Intravenous Not Given 08/18/20 1815)  aspirin chewable tablet 324 mg (324 mg Oral Given 08/18/20 1711)  iohexol (OMNIPAQUE) 350 MG/ML injection 75 mL (75 mLs Intravenous Contrast Given 08/18/20 1948)    ED Course  I have reviewed the triage vital signs and the nursing notes.  Pertinent labs & imaging results that were available during my care of the patient were reviewed by me and considered in my medical decision making (see chart for details).  Clinical Course as of 08/18/20 2022  Wed Aug 18, 2020  1717 Patient still having pain.  Will order morphine as well as nitroglycerin [JK]  1718 Repeat EKG unchanged [JK]  1759 D-dimer elevated compared to previous [JK]  1826 Initial troponin elevated at 41. [id number] CBC reviewed.  Anemia stable [JK]  1828 We will proceed with CT angiogram to rule out PE with elevated D-dimer. [JK]  2019 CT angiogram without PE.  Patient has remained pain-free after the nitroglycerin [JK]    Clinical Course User Index [JK] Dorie Rank, MD   MDM Rules/Calculators/A&P HEAR Score: 7                        Patient presented to the ED with complaints of chest pain.  Patient has cardiac risk factors.  He also has history of prior pulmonary embolism.  Pain was concerning for acute coronary syndrome.  Patient was treated with aspirin and nitroglycerin in the ED with resolution  of the symptoms.  Patient's troponins are elevated.  CT scan was also performed to rule out pulmonary embolism with his elevated D-dimer.  CT scan was fortunately negative.  Patient does have high risk heart score.  With his concerning features I will consult the medical service for admission.  Anticipate cardiology consultation in the morning.  Currently he is pain-free Final Clinical Impression(s) / ED Diagnoses Final diagnoses:  Unstable angina (HCC)     Dorie Rank, MD 08/18/20 2023

## 2020-08-18 NOTE — ED Notes (Signed)
When checking pt cbg pt stated that his cp was getting worse from a 5 from arrival to a 9 re checked ekg at this time give to DR.J.Knapp and rn also aware aware of same Tekeisha Hakim

## 2020-08-18 NOTE — ED Notes (Signed)
Spoke with Linzie Collin, Daughter, and updated her on patient status. She asked to be called if anything changes throughout the night, otherwise she'll check back on him in the morning.  [phone]

## 2020-08-19 ENCOUNTER — Observation Stay (HOSPITAL_COMMUNITY): Payer: No Typology Code available for payment source

## 2020-08-19 ENCOUNTER — Other Ambulatory Visit (HOSPITAL_COMMUNITY): Payer: Self-pay | Admitting: *Deleted

## 2020-08-19 DIAGNOSIS — I5030 Unspecified diastolic (congestive) heart failure: Secondary | ICD-10-CM

## 2020-08-19 DIAGNOSIS — M1990 Unspecified osteoarthritis, unspecified site: Secondary | ICD-10-CM | POA: Diagnosis present

## 2020-08-19 DIAGNOSIS — I2510 Atherosclerotic heart disease of native coronary artery without angina pectoris: Secondary | ICD-10-CM

## 2020-08-19 DIAGNOSIS — Z7982 Long term (current) use of aspirin: Secondary | ICD-10-CM | POA: Diagnosis not present

## 2020-08-19 DIAGNOSIS — R21 Rash and other nonspecific skin eruption: Secondary | ICD-10-CM | POA: Diagnosis not present

## 2020-08-19 DIAGNOSIS — I469 Cardiac arrest, cause unspecified: Secondary | ICD-10-CM | POA: Diagnosis not present

## 2020-08-19 DIAGNOSIS — I440 Atrioventricular block, first degree: Secondary | ICD-10-CM | POA: Diagnosis present

## 2020-08-19 DIAGNOSIS — Z66 Do not resuscitate: Secondary | ICD-10-CM | POA: Diagnosis not present

## 2020-08-19 DIAGNOSIS — R079 Chest pain, unspecified: Secondary | ICD-10-CM

## 2020-08-19 DIAGNOSIS — D649 Anemia, unspecified: Secondary | ICD-10-CM

## 2020-08-19 DIAGNOSIS — R040 Epistaxis: Secondary | ICD-10-CM | POA: Diagnosis not present

## 2020-08-19 DIAGNOSIS — N183 Chronic kidney disease, stage 3 unspecified: Secondary | ICD-10-CM

## 2020-08-19 DIAGNOSIS — I5033 Acute on chronic diastolic (congestive) heart failure: Secondary | ICD-10-CM | POA: Diagnosis not present

## 2020-08-19 DIAGNOSIS — R0902 Hypoxemia: Secondary | ICD-10-CM | POA: Diagnosis not present

## 2020-08-19 DIAGNOSIS — R55 Syncope and collapse: Secondary | ICD-10-CM

## 2020-08-19 DIAGNOSIS — E7800 Pure hypercholesterolemia, unspecified: Secondary | ICD-10-CM | POA: Diagnosis present

## 2020-08-19 DIAGNOSIS — Z86711 Personal history of pulmonary embolism: Secondary | ICD-10-CM | POA: Diagnosis not present

## 2020-08-19 DIAGNOSIS — R58 Hemorrhage, not elsewhere classified: Secondary | ICD-10-CM | POA: Diagnosis not present

## 2020-08-19 DIAGNOSIS — L89151 Pressure ulcer of sacral region, stage 1: Secondary | ICD-10-CM | POA: Diagnosis present

## 2020-08-19 DIAGNOSIS — J9611 Chronic respiratory failure with hypoxia: Secondary | ICD-10-CM | POA: Diagnosis present

## 2020-08-19 DIAGNOSIS — Z20822 Contact with and (suspected) exposure to covid-19: Secondary | ICD-10-CM | POA: Diagnosis present

## 2020-08-19 DIAGNOSIS — I509 Heart failure, unspecified: Secondary | ICD-10-CM

## 2020-08-19 DIAGNOSIS — I130 Hypertensive heart and chronic kidney disease with heart failure and stage 1 through stage 4 chronic kidney disease, or unspecified chronic kidney disease: Secondary | ICD-10-CM | POA: Diagnosis not present

## 2020-08-19 DIAGNOSIS — H9190 Unspecified hearing loss, unspecified ear: Secondary | ICD-10-CM | POA: Diagnosis present

## 2020-08-19 DIAGNOSIS — I441 Atrioventricular block, second degree: Secondary | ICD-10-CM | POA: Diagnosis not present

## 2020-08-19 DIAGNOSIS — N1832 Chronic kidney disease, stage 3b: Secondary | ICD-10-CM | POA: Diagnosis present

## 2020-08-19 DIAGNOSIS — I259 Chronic ischemic heart disease, unspecified: Secondary | ICD-10-CM | POA: Diagnosis not present

## 2020-08-19 DIAGNOSIS — I499 Cardiac arrhythmia, unspecified: Secondary | ICD-10-CM | POA: Diagnosis not present

## 2020-08-19 DIAGNOSIS — I2729 Other secondary pulmonary hypertension: Secondary | ICD-10-CM | POA: Diagnosis present

## 2020-08-19 DIAGNOSIS — K219 Gastro-esophageal reflux disease without esophagitis: Secondary | ICD-10-CM | POA: Diagnosis present

## 2020-08-19 DIAGNOSIS — J849 Interstitial pulmonary disease, unspecified: Secondary | ICD-10-CM | POA: Diagnosis present

## 2020-08-19 DIAGNOSIS — E785 Hyperlipidemia, unspecified: Secondary | ICD-10-CM | POA: Diagnosis present

## 2020-08-19 DIAGNOSIS — R7989 Other specified abnormal findings of blood chemistry: Secondary | ICD-10-CM

## 2020-08-19 DIAGNOSIS — I444 Left anterior fascicular block: Secondary | ICD-10-CM | POA: Diagnosis present

## 2020-08-19 DIAGNOSIS — I252 Old myocardial infarction: Secondary | ICD-10-CM | POA: Diagnosis not present

## 2020-08-19 DIAGNOSIS — Z743 Need for continuous supervision: Secondary | ICD-10-CM | POA: Diagnosis not present

## 2020-08-19 DIAGNOSIS — G459 Transient cerebral ischemic attack, unspecified: Secondary | ICD-10-CM | POA: Diagnosis not present

## 2020-08-19 DIAGNOSIS — I25110 Atherosclerotic heart disease of native coronary artery with unstable angina pectoris: Secondary | ICD-10-CM | POA: Diagnosis present

## 2020-08-19 DIAGNOSIS — I472 Ventricular tachycardia: Secondary | ICD-10-CM | POA: Diagnosis not present

## 2020-08-19 DIAGNOSIS — I517 Cardiomegaly: Secondary | ICD-10-CM | POA: Diagnosis not present

## 2020-08-19 DIAGNOSIS — D539 Nutritional anemia, unspecified: Secondary | ICD-10-CM

## 2020-08-19 DIAGNOSIS — Z955 Presence of coronary angioplasty implant and graft: Secondary | ICD-10-CM | POA: Diagnosis not present

## 2020-08-19 DIAGNOSIS — Y92009 Unspecified place in unspecified non-institutional (private) residence as the place of occurrence of the external cause: Secondary | ICD-10-CM

## 2020-08-19 LAB — TYPE AND SCREEN
ABO/RH(D): O POS
Antibody Screen: NEGATIVE

## 2020-08-19 LAB — URINALYSIS, ROUTINE W REFLEX MICROSCOPIC
Bacteria, UA: NONE SEEN
Bilirubin Urine: NEGATIVE
Glucose, UA: NEGATIVE mg/dL
Hgb urine dipstick: NEGATIVE
Ketones, ur: NEGATIVE mg/dL
Leukocytes,Ua: NEGATIVE
Nitrite: NEGATIVE
Protein, ur: 30 mg/dL — AB
Specific Gravity, Urine: 1.025 (ref 1.005–1.030)
pH: 5 (ref 5.0–8.0)

## 2020-08-19 LAB — COMPREHENSIVE METABOLIC PANEL
ALT: 24 U/L (ref 0–44)
AST: 36 U/L (ref 15–41)
Albumin: 3 g/dL — ABNORMAL LOW (ref 3.5–5.0)
Alkaline Phosphatase: 66 U/L (ref 38–126)
Anion gap: 7 (ref 5–15)
BUN: 25 mg/dL — ABNORMAL HIGH (ref 8–23)
CO2: 28 mmol/L (ref 22–32)
Calcium: 8.5 mg/dL — ABNORMAL LOW (ref 8.9–10.3)
Chloride: 105 mmol/L (ref 98–111)
Creatinine, Ser: 1.32 mg/dL — ABNORMAL HIGH (ref 0.61–1.24)
GFR, Estimated: 49 mL/min — ABNORMAL LOW (ref >60)
Glucose, Bld: 96 mg/dL (ref 70–99)
Potassium: 4.1 mmol/L (ref 3.5–5.1)
Sodium: 140 mmol/L (ref 135–145)
Total Bilirubin: 1 mg/dL (ref 0.3–1.2)
Total Protein: 6.2 g/dL — ABNORMAL LOW (ref 6.5–8.1)

## 2020-08-19 LAB — CBC

## 2020-08-19 LAB — PROTIME-INR
INR: 1.2 (ref 0.8–1.2)
Prothrombin Time: 15.4 seconds — ABNORMAL HIGH (ref 11.4–15.2)

## 2020-08-19 LAB — ECHOCARDIOGRAM LIMITED
Area-P 1/2: 1.84 cm2
Height: 64 in
P 1/2 time: 454 msec
S' Lateral: 2.3 cm
Weight: 2239.87 oz

## 2020-08-19 LAB — OCCULT BLOOD X 1 CARD TO LAB, STOOL: Fecal Occult Bld: NEGATIVE

## 2020-08-19 LAB — PHOSPHORUS: Phosphorus: 3.6 mg/dL (ref 2.5–4.6)

## 2020-08-19 LAB — APTT: aPTT: 28 seconds (ref 24–36)

## 2020-08-19 LAB — HEMOGLOBIN AND HEMATOCRIT, BLOOD
HCT: 37.8 % — ABNORMAL LOW (ref 39.0–52.0)
Hemoglobin: 11 g/dL — ABNORMAL LOW (ref 13.0–17.0)

## 2020-08-19 LAB — BRAIN NATRIURETIC PEPTIDE: B Natriuretic Peptide: 962 pg/mL — ABNORMAL HIGH (ref 0.0–100.0)

## 2020-08-19 LAB — MAGNESIUM: Magnesium: 1.7 mg/dL (ref 1.7–2.4)

## 2020-08-19 LAB — PROCALCITONIN: Procalcitonin: <0.1 ng/mL

## 2020-08-19 MED ORDER — PANTOPRAZOLE SODIUM 40 MG PO TBEC
40.0000 mg | DELAYED_RELEASE_TABLET | Freq: Every day | ORAL | Status: DC
  Administered 2020-08-19 – 2020-08-24 (×6): 40 mg via ORAL
  Filled 2020-08-19 (×6): qty 1

## 2020-08-19 MED ORDER — ATORVASTATIN CALCIUM 40 MG PO TABS
40.0000 mg | ORAL_TABLET | Freq: Every day | ORAL | Status: DC
  Administered 2020-08-19 – 2020-08-23 (×5): 40 mg via ORAL
  Filled 2020-08-19 (×5): qty 1

## 2020-08-19 MED ORDER — METOPROLOL TARTRATE 25 MG PO TABS
12.5000 mg | ORAL_TABLET | Freq: Two times a day (BID) | ORAL | Status: DC
  Administered 2020-08-19 – 2020-08-21 (×6): 12.5 mg via ORAL
  Filled 2020-08-19 (×7): qty 1

## 2020-08-19 MED ORDER — FUROSEMIDE 40 MG PO TABS
40.0000 mg | ORAL_TABLET | Freq: Every day | ORAL | Status: DC
  Administered 2020-08-19: 40 mg via ORAL
  Filled 2020-08-19: qty 1

## 2020-08-19 MED ORDER — MAGNESIUM SULFATE 2 GM/50ML IV SOLN
2.0000 g | Freq: Once | INTRAVENOUS | Status: AC
  Administered 2020-08-19: 2 g via INTRAVENOUS
  Filled 2020-08-19: qty 50

## 2020-08-19 MED ORDER — GUAIFENESIN ER 600 MG PO TB12
600.0000 mg | ORAL_TABLET | Freq: Two times a day (BID) | ORAL | Status: DC
  Administered 2020-08-19 – 2020-08-24 (×11): 600 mg via ORAL
  Filled 2020-08-19 (×11): qty 1

## 2020-08-19 MED ORDER — IPRATROPIUM-ALBUTEROL 0.5-2.5 (3) MG/3ML IN SOLN
3.0000 mL | Freq: Three times a day (TID) | RESPIRATORY_TRACT | Status: DC
  Administered 2020-08-19 – 2020-08-20 (×4): 3 mL via RESPIRATORY_TRACT
  Filled 2020-08-19 (×4): qty 3

## 2020-08-19 MED ORDER — FUROSEMIDE 10 MG/ML IJ SOLN
40.0000 mg | Freq: Every day | INTRAMUSCULAR | Status: AC
  Administered 2020-08-19 – 2020-08-20 (×2): 40 mg via INTRAVENOUS
  Filled 2020-08-19 (×2): qty 4

## 2020-08-19 MED ORDER — ASPIRIN EC 81 MG PO TBEC
81.0000 mg | DELAYED_RELEASE_TABLET | Freq: Every day | ORAL | Status: DC
  Administered 2020-08-19 – 2020-08-22 (×4): 81 mg via ORAL
  Filled 2020-08-19 (×4): qty 1

## 2020-08-19 MED ORDER — ISOSORBIDE MONONITRATE ER 60 MG PO TB24
60.0000 mg | ORAL_TABLET | Freq: Every day | ORAL | Status: DC
  Administered 2020-08-19 – 2020-08-20 (×2): 60 mg via ORAL
  Filled 2020-08-19 (×2): qty 1

## 2020-08-19 MED ORDER — DONEPEZIL HCL 5 MG PO TABS
10.0000 mg | ORAL_TABLET | Freq: Every day | ORAL | Status: DC
  Administered 2020-08-19 – 2020-08-24 (×6): 10 mg via ORAL
  Filled 2020-08-19 (×7): qty 2

## 2020-08-19 NOTE — Progress Notes (Signed)
[facility] notification done. Reference number is [id number].

## 2020-08-19 NOTE — ED Notes (Signed)
Echo at bedside

## 2020-08-19 NOTE — TOC Transition Note (Signed)
Transition of Care [facility]) - CM/SW Discharge Note   Patient Details  Name: Anthony Galvan MRN: [id number] Date of Birth: 10-06-1921  Transition of Care [facility]) CM/SW Contact:  Shade Flood, LCSW Phone Number: 08/19/2020, 11:46 AM   Clinical Narrative:     Pt here observation status from home. PT/OT recommending HH at dc. Spoke with pt who requests that TOC speak with his daughter. Spoke with dtr, Santiago Glad, who states pt has been active with [facility] for PT/OT and they were just getting ready to dc him from services. Daughter agreeable to continuing with the [facility] if pt qualifies. Asked MD to order and updated Vaughan Basta at [place].  Daughter did not identify any other TOC needs for dc.  Expected Discharge Plan: [place] Barriers to Discharge: Barriers Resolved   Patient Goals and CMS Choice Patient states their goals for this hospitalization and ongoing recovery are:: go home CMS Medicare.gov Compare Post Acute Care list provided to:: Patient Represenative (must comment) Choice offered to / list presented to : Adult Children  Expected Discharge Plan and Services Expected Discharge Plan: [place] In-house Referral: Clinical Social Work   Post Acute Care Choice: [place] arrangements for the past 2 months: [place]                                      Prior Living Arrangements/Services Living arrangements for the past 2 months: Single Family Home Lives with:: Adult Children Patient language and need for interpreter reviewed:: Yes Do you feel safe going back to the place where you live?: Yes      Need for Family Participation in Patient Care: Yes (Comment) Care giver support system in place?: Yes (comment)   Criminal Activity/Legal Involvement Pertinent to Current Situation/Hospitalization: No - Comment as needed  Activities of Daily Living      Permission Sought/Granted Permission sought to share information with :  Chartered certified accountant granted to share information with : Yes, Verbal Permission Granted     Permission granted to share info w AGENCY: [employer]        Emotional Assessment Appearance:: Appears younger than stated age Attitude/Demeanor/Rapport: Engaged Affect (typically observed): Pleasant Orientation: : Oriented to Self,Oriented to Place,Oriented to Situation,Oriented to  Time Alcohol / Substance Use: Not Applicable Psych Involvement: No (comment)  Admission diagnosis:  Chest pain [R07.9] Patient Active Problem List   Diagnosis Date Noted  . Fall at home, initial encounter 08/19/2020  . Syncope 08/19/2020  . Macrocytic anemia 08/19/2020  . Symptomatic anemia 08/19/2020  . Elevated d-dimer 08/19/2020  . (HFpEF) heart failure with preserved ejection fraction ([place]) 08/19/2020  . Chest pain 08/18/2020  . Hyperbilirubinemia 04/13/2020  . CKD (chronic kidney disease), stage V ([place]) 04/13/2020  . NSTEMI (non-ST elevated myocardial infarction) ([place]) 04/13/2020  . Acute coronary syndrome with high troponin ([place]) 04/12/2020  . Constipation 05/29/2019  . Acute on chronic respiratory failure ([place])   . Cor pulmonale (chronic) ([place]) 01/26/2019  . Chronic respiratory failure with hypoxia ([place]) 01/25/2019  . Bronchiectasis ([place]) 01/25/2019  . Acute CHF (congestive heart failure) ([place]) 01/24/2019  . Normocytic anemia 01/24/2019  . Thrombocytopenia ([place]) 01/24/2019  . Interstitial lung disease ([place]) 06/15/2018  . Pulmonary hypertension ([place]) 06/15/2018  . Acute pulmonary embolus ([place]) 06/14/2018  . Acute on chronic diastolic CHF (congestive heart failure) ([place]) 06/12/2018  . Acute respiratory failure  with hypoxia ([place]) 06/12/2018  . Fracture 05/07/2018  . Closed displaced intertrochanteric fracture of left femur ([place])   . Coronary artery disease involving native heart without angina pectoris   . Dyspepsia 01/18/2017  . Closed disp intertrochanteric fracture of right  femur with nonunion 11/30/2016  . PUD (peptic ulcer disease)   . History of coronary artery stent placement 10/08/2016  . Acute on chronic renal failure ([place]) 10/08/2016  . CKD (chronic kidney disease) stage 3, GFR 30-59 ml/min (HCC) 10/08/2016  . Bloating 09/03/2013  . CAD S/P percutaneous coronary angioplasty   . MI (myocardial infarction) ([place])   . Presence of bare metal stent in LAD coronary artery - VerifFlex BMS 3.0 mm x 12 mm - post-dilated to 3.5 mm 08/01/2012  . Unstable angina ([place]) 07/30/2012  . Gastritis 12/15/2010  . Nausea 08/30/2010  . Hyperlipidemia with target low density lipoprotein (LDL) cholesterol less than 70 mg/dL 05/21/2006  . CATARACT NOS 05/21/2006  . Essential hypertension 05/21/2006  . MYOCARDIAL INFARCTION, HX OF 05/21/2006  . GERD 05/21/2006  . OVERACTIVE BLADDER 05/21/2006  . OSTEOARTHRITIS 05/21/2006  . URINARY INCONTINENCE 05/21/2006   PCP:  [facility], [place]:   [place], [place] [address] Phone: [phone] Fax: [place], [address], [address] [place], [place] [address] Phone: [phone] Fax: [phone]  [address] [place], [place] [address] Phone: [phone] Fax: [phone]     Social Determinants of Health (SDOH) Interventions    Readmission Risk Interventions Readmission Risk Prevention Plan 01/29/2019 01/28/2019  Transportation Screening - Complete  PCP or Specialist Appt within 3-5 Days Complete -  HRI or [place] - Complete  Social Work Consult for [place] Planning/Counseling - Complete  Palliative Care Screening - Not Complete  Medication Review Press photographer) - Complete  Some recent data might be hidden     Final next level of care: [place] Barriers to  Discharge: Barriers Resolved   Patient Goals and CMS Choice Patient states their goals for this hospitalization and ongoing recovery are:: go home CMS Medicare.gov Compare Post Acute Care list provided to:: Patient Represenative (must comment) Choice offered to / list presented to : Adult Children  Discharge Placement                       Discharge Plan and Services In-house Referral: Clinical Social Work   Post Acute Care Choice: Home Health                               Social Determinants of Health (SDOH) Interventions     Readmission Risk Interventions Readmission Risk Prevention Plan 01/29/2019 01/28/2019  Transportation Screening - Complete  PCP or Specialist Appt within 3-5 Days Complete -  HRI or [place] - Complete  Social Work Consult for [place] Planning/Counseling - Complete  Palliative Care Screening - Not Complete  Medication Review Press photographer) - Complete  Some recent data might be hidden

## 2020-08-19 NOTE — Consult Note (Addendum)
Cardiology Consultation:   Patient ID: Anthony Galvan MRN: [id number]; DOB: 1922/02/12  Admit date: 08/18/2020 Date of Consult: 08/19/2020  PCP:  [facility] Cardiologist:  Rozann Lesches, MD        Patient Profile:   Anthony Galvan is a 87 y.o. male with past medical history of CAD (s/p BMS to LAD in 2014, repeat cath in 10/2016 showing patent stent and 80% distal LCx stenosis not suitable for PCI and medical management recommended),ILD (on 3L [place] at baseline), chronic diastolic CHF,pulmonary HTN, moderate to severe TR, acute PE (diagnosed in 05/2018- no longer on anticoagulation),HTN, HLD,GERDand Stage 3 CKD who is being seen today for the evaluation of chest pain at the request of Dr. Josephine Cables.  History of Present Illness:   Anthony Galvan was most recently admitted to [facility] in 04/2020 for an NSTEMI with peak Hs Troponin of 271. Repeat echo showed a preserved EF of 65-70% with no regional WMA. He did have Grade 1 DD, low-normal RV function, moderately elevated PASP, trivial MR and moderate to severe TR. Given his age and comorbidities, medical management was recommended and he was not felt to be a candidate for a cardiac catheterization.    He presented back to [facility] ED on 08/18/2020 for evaluation of diaphoresis and chest pain. By review of notes, he was getting into the car when he fell and passed out. Was initially reporting shoulder pain but also mentioned chest pain as well. In talking with the patient today, he is unable to recall the specific events surrounding yesterday but says when he awoke he was on his way to the hospital.  He is unsure if he fell and hit his head and then lost consciousness or lost consciousness first. Says he had overall been feeling well until yesterday and did not experience any prodromal symptoms. He denies any recent chest pain up until yesterday and states he has not had to utilize SL NTG in several months. He  did have chest pain upon arrival to the ED and this resolved with SL NTG. Denies any recurrent pain this AM. He has baseline dyspnea on exertion and is on 3-4L [place]. Also experiences intermittent lower extremity edema.   Initial labs show WBC 8.5, Hgb 10.8, platelets 126, Na+ 142, K+ 4.7 and creatinine 1.47. Appears a Hgb was rechecked and resulted at 6.7 and this was rechecked again and at 11.0. D-dimer 3.61. COVID negative. Initial Hs Troponin 41 with repeat of 62. CXR with interstitial lung disease and possible superimposed infiltrates at the right base and periphery of left lung. CT Head with no acute intracranial abnormality. CTA showing no evidence of a PE. Noted to have small bilateral pleural effusions with left greater than right, cardiomegaly, and a trace pericardial effusion.    Past Medical History:  Diagnosis Date  . Bronchiectasis ([place]) 06/13/2018  . CAD (coronary artery disease) 08/01/2012   BMS mid LAD; catheterization in July 2018 showed with severe heavily calcified distal circumflex-left PDA stenosis, not favorable for PCI.  Marland Kitchen Congestive heart failure (CHF) ([place])   . Duodenal ulcer May 2011   on EGD  . Essential hypertension   . GERD (gastroesophageal reflux disease)   . Helicobacter pylori gastritis 2012  . Hyperlipidemia   . ILD (interstitial lung disease) ([place])   . MI (myocardial infarction) ([place]) 1976, 1980, [place] for at [facility] and [place] (Dr. Iona Beard)  . Osteoarthritis   .  Pulmonary embolism ([place])   . Pulmonary hypertension ([place])   . S/P colonoscopy 2011   normal, internal hemorrhoids    Past Surgical History:  Procedure Laterality Date  . CARDIAC CATHETERIZATION  01/30/2007   D1 75 %, mid LAD 50%, 50-70% circumflex, 50-70% RCA.(Dr. Adora Fridge)  . CARDIAC CATHETERIZATION  12/11/2001   normal L main, small RCA, dominant LL Cfx with mild diffuse disease, LAD with mid 10-20% stenosis, ramus intermedius with mild diffuse disease (Dr. Jackie Plum)  .  CATARACT EXTRACTION    . COLONOSCOPY  OCT 2011 ARS   [facility] [place]   in setting of MI  . ESOPHAGOGASTRODUODENOSCOPY N/A 10/09/2016   Procedure: ESOPHAGOGASTRODUODENOSCOPY (EGD);  Surgeon: Danie Binder, MD;  [facility];  Service: Endoscopy;  Laterality: N/A;  . FEMUR IM NAIL Right 12/01/2016   Procedure: INTRAMEDULLARY (IM) NAIL RIGHT HIP;  Surgeon: Altamese Ladd, MD;  Location: [place];  Service: Orthopedics;  Laterality: Right;  . FEMUR IM NAIL Left 05/07/2018   Procedure: INTRAMEDULLARY (IM) NAIL FEMORAL;  Surgeon: Nicholes Stairs, MD;  Location: [facility];  Service: Orthopedics;  Laterality: Left;  . LEFT HEART CATH AND CORONARY ANGIOGRAPHY N/A 10/23/2016   Procedure: Left Heart Cath and Coronary Angiography;  Surgeon: Sherren Mocha, MD;  Location: [facility] CV LAB;  Service: Cardiovascular: Two-vessel CAD (left dominant).  Patent BMS in p LAD.  Severe, heavily calcified dCx-LPDA lesion.  Not favorable for PCI.  Marland Kitchen LEFT HEART CATHETERIZATION WITH CORONARY ANGIOGRAM N/A 08/01/2012   Procedure: LEFT HEART CATHETERIZATION WITH CORONARY ANGIOGRAM;  Surgeon: Leonie Man, MD;  Location: [facility] CATH LAB: Mid LAD 99% apple core; D2 ostial 70-80% (2 small for PCI) small nondominant RCA 60-70%. Distal circumflex/L PDA ~50% --> PCI of LAD  . NM MYOCAR PERF WALL MOTION  08/2003   adenosine stress - focal decreased perfusion defect in distal inferior wall, no significant ischemic changes  . PERCUTANEOUS STENT INTERVENTION  08/01/2012   Procedure: PERCUTANEOUS STENT INTERVENTION;  Surgeon: Leonie Man, MD;  Location: [facility] CATH LAB; ;PCI of mid LAD with a VeriFlex Bare Metal Stent 3.0 mm x 12 mm - post-dilated to 3.5 mm.  . TRANSTHORACIC ECHOCARDIOGRAM  10/2016   mild LVH - 60-65%. Gr 1 DD. No RWMA. Mild MR. Mod TR. Mild RA dilation.  PAP ~ 60 mmHg.  Marland Kitchen UPPER GASTROINTESTINAL ENDOSCOPY  08/2009   MELENA, HEMATEMESIS --> DUODENAL ULCER, Bx: H PYLORI POS     Home  Medications:  Prior to Admission medications   Medication Sig Start Date End Date Taking? Authorizing Provider  acetaminophen (TYLENOL) 325 MG tablet Take 2 tablets (650 mg total) by mouth 3 (three) times daily as needed (pain). 12/04/16   Ainsley Spinner, PA-C  alum & mag hydroxide-simeth (MAALOX/MYLANTA) 200-200-20 MG/5ML suspension Take 30 mLs by mouth every 4 (four) hours as needed for indigestion. 01/29/19   Johnson, Clanford L, MD  aspirin EC 81 MG tablet Take 1 tablet (81 mg total) by mouth daily. Swallow whole. 10/01/19   Orion Mole, Fransisco Hertz, PA-C  atorvastatin (LIPITOR) 80 MG tablet Take 1 tablet (80 mg total) by mouth daily at 6 PM. Patient taking differently: Take 40 mg by mouth daily at 6 PM. 08/02/12   Brett Canales, PA-C  calcium carbonate (OS-CAL - DOSED IN MG OF ELEMENTAL CALCIUM) 1250 (500 Ca) MG tablet Take 1 tablet (500 mg of elemental calcium total) by mouth daily with breakfast. 05/13/18  Mullis, Kiersten P, DO  donepezil (ARICEPT) 10 MG tablet Take 10 mg by mouth daily.    [provider]  furosemide (LASIX) 40 MG tablet Take 1.5 tablets (60 mg total) by mouth daily. Patient taking differently: Take 40 mg by mouth daily. 10/01/19   Deion Swift, Fransisco Hertz, PA-C  guaiFENesin (MUCINEX) 600 MG 12 hr tablet Take 1 tablet (600 mg total) by mouth 2 (two) times daily. 06/15/18   Kathie Dike, MD  ipratropium-albuterol (DUONEB) 0.5-2.5 (3) MG/3ML SOLN Take 3 mLs by nebulization 3 (three) times daily. 01/29/19   Johnson, Clanford L, MD  isosorbide mononitrate (IMDUR) 60 MG 24 hr tablet Take 1 tablet (60 mg total) by mouth daily. 04/20/20   Geradine Girt, DO  metoprolol tartrate (LOPRESSOR) 25 MG tablet Take 0.5 tablets (12.5 mg total) by mouth 2 (two) times daily. 05/13/18   Mullis, Kiersten P, DO  Multiple Vitamin (MULTIVITAMIN WITH MINERALS) TABS tablet Take 1 tablet by mouth daily.    [provider]  NITROSTAT 0.4 MG SL tablet PLACE 1 TABLET UNDER TONGUE EVERY 5 MINUTES FOR  3 DOSES AS NEEDED. Patient taking differently: Place 0.4 mg under the tongue every 5 (five) minutes as needed for chest pain. 11/17/13   Leonie Man, MD  pantoprazole (PROTONIX) 40 MG tablet Take 1 tablet (40 mg total) by mouth daily. 01/30/19   Johnson, Clanford L, MD  polyethylene glycol (MIRALAX / GLYCOLAX) 17 g packet Take 17 g by mouth as needed. 04/19/20   Geradine Girt, DO  Vitamin D, Ergocalciferol, (DRISDOL) 1.25 MG (50000 UT) CAPS capsule Take 1 capsule (50,000 Units total) by mouth every 7 (seven) days. 05/16/18   Mullis, Archie Endo, DO    Inpatient Medications: Scheduled Meds: . aspirin EC  81 mg Oral Daily  . atorvastatin  40 mg Oral q1800  . donepezil  10 mg Oral Daily  . enoxaparin (LOVENOX) injection  30 mg Subcutaneous Q24H  . furosemide  40 mg Intravenous Daily  . guaiFENesin  600 mg Oral BID  . ipratropium-albuterol  3 mL Nebulization TID  . isosorbide mononitrate  60 mg Oral Daily  . metoprolol tartrate  12.5 mg Oral BID  .  morphine injection  4 mg Intravenous Once  . pantoprazole  40 mg Oral Daily   Continuous Infusions: . magnesium sulfate bolus IVPB 2 g (08/19/20 1043)   PRN Meds: nitroGLYCERIN  Allergies:    Allergies  Allergen Reactions  . Lisinopril Other (See Comments)    "Allergic," per the [facility]- patient is unaware of any reaction (??)    Social History:   Social History   Socioeconomic History  . Marital status: Widowed    Spouse name: Not on file  . Number of children: 3  . Years of education: Not on file  . Highest education level: Not on file  Occupational History  . Not on file  Tobacco Use  . Smoking status: Never Smoker  . Smokeless tobacco: Never Used  Vaping Use  . Vaping Use: Never used  Substance and Sexual Activity  . Alcohol use: No  . Drug use: No  . Sexual activity: Not on file  Other Topics Concern  . Not on file  Social History Narrative   Is a father of 3 with 3 grandchildren 3 great-grandchildren. He lives with  one of his 2 grandsons.   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 He  was a war 2 veteran in the [place]. 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   He never smoked. He does not drink alcohol.   He is very active, and a majority. He walks 30+ minutes every day. He also likes to play golf.   Social Determinants of Health   Financial Resource Strain: Not on file  Food Insecurity: Not on file  Transportation Needs: Not on file  Physical Activity: Not on file  Stress: Not on file  Social Connections: Not on file  Intimate Partner Violence: Not on file    Family History:    Family History  Problem Relation Age of Onset  . Heart disease Mother   . Kidney disease Father   . Heart disease Brother 63  . Heart disease Sister   . Colon cancer Neg Hx      ROS:  Please see the history of present illness.   All other ROS reviewed and negative.     Physical Exam/Data:   Vitals:   08/19/20 0900 08/19/20 0930 08/19/20 1000 08/19/20 1030  BP: (!) 125/110 139/66 127/84 (!) 142/67  Pulse: 74 77 79 78  Resp: (!) 27 (!) 24 (!) 21 (!) 25  Temp:      TempSrc:      SpO2: 93% 95% 96% 92%  Weight:      Height:       No intake or output data in the 24 hours ending 08/19/20 1113 Last 3 Weights 08/18/2020 04/19/2020 04/18/2020  Weight (lbs) 139 lb 15.9 oz 139 lb 15.9 oz 134 lb 4.2 oz  Weight (kg) 63.5 kg 63.5 kg 60.9 kg     Body mass index is 24.03 kg/m.  General:  Pleasant elderly male appearing in no acute distress.  HEENT: normal Lymph: no adenopathy Neck: JVD at 9 cm Endocrine:  No thryomegaly Vascular: No carotid bruits; FA pulses 2+ bilaterally without bruits  Cardiac:  normal S1, S2; RRR; 2/6 SEM along RUSB.  Lungs: Decreased breath sounds along the bases bilaterally.  Abd: soft, nontender, no hepatomegaly  Ext: 1+ pitting edema up to mid-shins bilaterally.   Musculoskeletal:  No deformities, BUE and BLE strength normal and equal Skin: warm and dry  Neuro:  CNs 2-12 intact, no focal abnormalities noted Psych:  Normal affect   EKG:  The EKG was personally reviewed and demonstrates: NSR, HR 81 with LAFB. No acute ST changes when compared to prior tracings.    Telemetry:  Telemetry was personally reviewed and demonstrates:  NSR, HR in 70's to 80's. Occasional PVC's and one episode of NSVT for 20 beats.   Relevant CV Studies:  Echocardiogram: 04/13/2020 IMPRESSIONS    1. Left ventricular ejection fraction, by estimation, is 65 to 70%. The  left ventricle has normal function. The left ventricle has no regional  wall motion abnormalities. There is mild left ventricular hypertrophy.  Left ventricular diastolic parameters  are consistent with Grade I diastolic dysfunction (impaired relaxation).  2. Right ventricular systolic function is low normal. The right  ventricular size is moderately enlarged. Mildly increased right  ventricular wall thickness. There is moderately elevated pulmonary artery  systolic pressure.  3. Right atrial size was severely dilated.  4. The mitral valve is normal in structure. Trivial mitral valve  regurgitation.  5. Tricuspid valve regurgitation is moderate to severe.  6. The aortic valve is abnormal. Aortic valve regurgitation is trivial.  Mild to moderate  aortic valve sclerosis/calcification is present, without  any evidence of aortic stenosis.  7. The inferior vena cava is dilated in size with <50% respiratory  variability, suggesting right atrial pressure of 15 mmHg.   Comparison(s): No significant change from prior study.   Laboratory Data:  High Sensitivity Troponin:   Recent Labs  Lab 08/18/20 1742 08/18/20 1914  TROPONINIHS 41* 62*     Chemistry Recent Labs  Lab 08/18/20 1742 08/19/20 0340  NA 142 140  K 4.7 4.1  CL 105 105  CO2 29 28  GLUCOSE 114* 96  BUN 25* 25*  CREATININE  1.47* 1.32*  CALCIUM 8.9 8.5*  GFRNONAA 43* 49*  ANIONGAP 8 7    Recent Labs  Lab 08/19/20 0340  PROT 6.2*  ALBUMIN 3.0*  AST 36  ALT 24  ALKPHOS 66  BILITOT 1.0   Hematology Recent Labs  Lab 08/18/20 1742 08/19/20 0340 08/19/20 0611  WBC 8.5 TEST WILL BE CREDITED  --   RBC 3.56* TEST WILL BE CREDITED  --   HGB 10.8* TEST WILL BE CREDITED 11.0*  HCT 37.3* TEST WILL BE CREDITED 37.8*  MCV 104.8* TEST WILL BE CREDITED  --   MCH 30.3 TEST WILL BE CREDITED  --   MCHC 29.0* TEST WILL BE CREDITED  --   RDW 13.3 TEST WILL BE CREDITED  --   PLT 126* TEST WILL BE CREDITED  --    BNP Recent Labs  Lab 08/19/20 0340  BNP 962.0*    DDimer  Recent Labs  Lab 08/18/20 1742  DDIMER 3.61*     Radiology/Studies:  DG Chest 1 View  Result Date: 08/18/2020 CLINICAL DATA:  Near syncopal episode chest pain EXAM: CHEST  1 VIEW COMPARISON:  04/13/2020, CT chest 01/25/2019 FINDINGS: Peripheral and basilar reticular opacity and ground-glass changes, likely corresponding to interstitial lung disease on prior CT. Possible superimposed acute airspace disease at the right base and periphery of the left lower lung compared to prior. Mild cardiomegaly with aortic atherosclerosis. No pneumothorax IMPRESSION: 1. Peripheral and basilar interstitial lung disease. Possible acute superimposed infiltrates at the right base and periphery of left lung, question atypical or viral process 2. Cardiomegaly Electronically Signed   By: Donavan Foil M.D.   On: 08/18/2020 17:28   CT Head Wo Contrast  Result Date: 08/18/2020 CLINICAL DATA:  Near syncopal episode EXAM: CT HEAD WITHOUT CONTRAST TECHNIQUE: Contiguous axial images were obtained from the base of the skull through the vertex without intravenous contrast. COMPARISON:  CT brain 04/14/2019 FINDINGS: Brain: No acute territorial infarction, hemorrhage or intracranial mass. Moderate atrophy and chronic small vessel ischemic changes of the white matter. Chronic  appearing lacunar infarcts or small vessel disease in the right basal ganglia and left thalamus. Stable ventricle size Vascular: No hyperdense vessels.  Carotid vascular calcification Skull: Normal. Negative for fracture or focal lesion. Sinuses/Orbits: Patchy mucosal thickening in left ethmoid sinus Other: None IMPRESSION: 1. No CT evidence for acute intracranial abnormality. 2. Atrophy and chronic small vessel ischemic change of the white matter. Electronically Signed   By: Donavan Foil M.D.   On: 08/18/2020 17:32   CT Angio Chest PE W and/or Wo Contrast  Result Date: 08/18/2020 CLINICAL DATA:  Centralized chest pain radiating to jaw, positive D dimer EXAM: CT ANGIOGRAPHY CHEST WITH CONTRAST TECHNIQUE: Multidetector CT imaging of the chest was performed using the standard protocol during bolus administration of intravenous contrast. Multiplanar CT image reconstructions and MIPs were obtained to evaluate the vascular  anatomy. CONTRAST:  20m OMNIPAQUE IOHEXOL 350 MG/ML SOLN COMPARISON:  08/18/2020, 01/25/2019 FINDINGS: Cardiovascular: This is a technically adequate evaluation of the pulmonary vasculature. No filling defects or pulmonary emboli. The heart is enlarged, marked right atrial dilatation. Reflux of contrast into the hepatic veins may reflect an element of cardiac dysfunction. Trace pericardial effusion. Normal caliber of the thoracic aorta. Diffuse atherosclerosis of the aorta and coronary vasculature. Mediastinum/Nodes: No enlarged mediastinal, hilar, or axillary lymph nodes. Thyroid gland, trachea, and esophagus demonstrate no significant findings. Lungs/Pleura: Persistent bibasilar scarring and fibrosis. There are small bilateral pleural effusions, left greater than right. Chronic bilateral interlobular septal thickening again noted, with relative sparing of the left upper lobe, most consistent with chronic interstitial lung disease. Denser areas of consolidation within the lung bases likely  reflect atelectasis. Bilateral lower lobe bronchiectasis unchanged. Central airways are patent. Upper Abdomen: Small volume ascites upper abdomen. No acute findings otherwise. Musculoskeletal: There is diffuse body wall edema. No acute or destructive bony lesions. Reconstructed images demonstrate no additional findings. Review of the MIP images confirms the above findings. IMPRESSION: 1. No evidence of pulmonary embolus. 2. Small bilateral pleural effusions, left greater than right. 3. Chronic interstitial lung disease, with diffuse interlobular septal thickening as well as basilar predominant scarring and fibrosis. 4. Patchy bibasilar atelectasis. 5. Cardiomegaly, with evidence of right heart failure. Trace pericardial effusion. 6. Aortic Atherosclerosis (ICD10-I70.0). Coronary artery atherosclerosis. Electronically Signed   By: MRanda NgoM.D.   On: 08/18/2020 20:10   UKoreaCarotid Bilateral  Result Date: 08/19/2020 CLINICAL DATA:  Syncopal episode. History of CAD (post myocardial infarction), stroke/TIA hypertension and hyperlipidemia. EXAM: BILATERAL CAROTID DUPLEX ULTRASOUND TECHNIQUE: GPearline Cablesscale imaging, color Doppler and duplex ultrasound were performed of bilateral carotid and vertebral arteries in the neck. COMPARISON:  07/23/2003 FINDINGS: Criteria: Quantification of carotid stenosis is based on velocity parameters that correlate the residual internal carotid diameter with NASCET-based stenosis levels, using the diameter of the distal internal carotid lumen as the denominator for stenosis measurement. The following velocity measurements were obtained: RIGHT ICA: 59/20 cm/sec CCA: [id number]/sec SYSTOLIC ICA/CCA RATIO:  1.0 ECA: 50 cm/sec LEFT ICA: 58/20 cm/sec CCA: 80000000cm/sec SYSTOLIC ICA/CCA RATIO:  0.7 ECA: 43 cm/sec RIGHT CAROTID ARTERY: There is a moderate amount of eccentric echogenic plaque within the right carotid bulb (image 16), extending to involve the origin and proximal aspects of the right  internal carotid artery (image 24), progressed compared to remote examination performed in 2005 though not resulting in elevated peak systolic velocities within the interrogated course of the right internal carotid artery to suggest a hemodynamically significant stenosis. RIGHT VERTEBRAL ARTERY:  Antegrade flow LEFT CAROTID ARTERY: The left common carotid artery is noted to be tortuous (representative image 38). There is a minimal amount of eccentric echogenic plaque involving the mid (image 40) and distal (image 45) aspects of the left common carotid artery. There is a minimal amount of eccentric echogenic plaque the left carotid bulb (image 48 and 50), extending to involve the origin of the left internal carotid artery (image 58), progressed compared to remote examination performed in 2005 though not resulting in elevated peak systolic velocities within the interrogated course of the left internal carotid artery to suggest a hemodynamically significant stenosis. LEFT VERTEBRAL ARTERY:  Antegrade flow IMPRESSION: Minimal to moderate amount of bilateral atherosclerotic plaque, right greater than left, progressed compared to remote examination performed in 2005 though not resulting in a hemodynamically significant stenosis within either internal carotid artery. Electronically Signed  By: Sandi Mariscal M.D.   On: 08/19/2020 08:54     Assessment and Plan:   1. Chest Pain in the Setting of known CAD - He is s/p BMS to LAD in 2014 with repeat cath in 10/2016 showing patent stent and 80% distal LCx stenosis not suitable for PCI and medical management was recommended.  - He did experience chest pain yesterday upon arrival to the ED which resolved with SL NTG x2. Hs Troponin values have been mildly elevated at 41 and 62 but EKG shows no acute ST changes. Hs Troponin values lower than prior values in 04/2020. - Reviewed with the patient he is not an ideal candidate for a cardiac catheterization in the setting of his  advanced age and due to his comorbidities. A repeat echocardiogram has been ordered by the admitting team with results pending. Would continue to focus on medical therapy.  - Continue ASA '81mg'$  daily, Atorvastatin '40mg'$  daily and Imdur '60mg'$  daily. Will restart Lopressor 12.'5mg'$  BID.     2. Acute on Chronic Diastolic CHF - He has baseline dyspnea on exertion but denies any specific orthopnea or PND. Chest CT did show small bilateral pleural effusions and his exam is consistent with this. He received PO Lasix '40mg'$  this AM. Will give IV Lasix '40mg'$  this afternoon and additional dose in the AM. Follow I&O's along with daily weights. Add on BNP to AM labs.   3. Syncopal Episode/NSVT - It is unclear if he fell and then lost consciousness or experienced syncope then fell. The patient is unsure of the exact events and no family members are currently at the bedside.  - Repeat echo has been ordered. Carotid dopplers were obtained and showed minimal to moderate plaque but no hemodynamically significant stenosis.  - By review of telemetry, he does have PVC's and had an episode of 20 beats NSVT this morning. K+ 4.1 and Mg at 1.7. Try to keep K+ ~ 4.0 and Mg ~ 2.0. Will order additional Mg supplementation. Will also restart PTA Lopressor 12.'5mg'$  BID as this was not ordered on admission.    4. ILD - He is on 3-4 L [place] at baseline. Denies any acute changes in his respiratory status.   5. Stage 3 CKD - Baseline creatinine ~ 1.5 but peaked at 3.78 in 03/2020. At 1.47 on admission and improved to 1.32 today. Follow with diuresis.     Risk Assessment/Risk Scores:     HEAR Score (for undifferentiated chest pain):  HEAR Score: 7   For questions or updates, please contact [place] Please consult [email or web address] for contact info under    Signed, Erma Heritage, PA-C  08/19/2020 11:13 AM

## 2020-08-19 NOTE — Progress Notes (Signed)
PROGRESS NOTE    Anthony Galvan  [id number] DOB: [birth year] DOA: 08/18/2020 PCP: [facility]   Brief Narrative:   Anthony Galvan is a 87 y.o. male with medical history significant for CAD s/p stent placement, bronchiectasis, interstitial lung disease on home oxygen of 4 LPM via [place], duodenal ulcer, GERD, H. pylori gastritis, internal hemorrhoids, hyperlipidemia, CAD, chronic diastolic heart failure, pulmonary hypertension, history of pulmonary embolism who presents to the emergency department due to chest pain which started shortly prior to arrival to the ED.   Assessment & Plan:   Principal Problem:   Chest pain Active Problems:   Hyperlipidemia with target low density lipoprotein (LDL) cholesterol less than 70 mg/dL   GERD   CAD S/P percutaneous coronary angioplasty   CKD (chronic kidney disease) stage 3, GFR 30-59 ml/min (HCC)   Interstitial lung disease ([place])   Pulmonary hypertension (HCC)   Fall at home, initial encounter   Syncope   Macrocytic anemia   Symptomatic anemia   Elevated d-dimer   (HFpEF) heart failure with preserved ejection fraction (HCC)   Chest pain in the setting of known CAD -This has currently resolved -Continue aspirin, statin, Imdur, and Lopressor -Not an ideal candidate for any further testing  Acute on chronic diastolic CHF exacerbation -Diuresis as scheduled per cardiology with IV Lasix 40 mg this afternoon and additional dose in a.m. -Follow daily weights and I's and O's -Repeat 2D echocardiogram pending -Wears 2L [place] at home  Syncopal episode/NSVT -Patient does have PVCs, plan to keep K at 4.0 and Mg at 2.0 -Lopressor BID as ordered -Carotid Dopplers without any significant findings  ILD with associated pulmonary hypertension -No acute issues noted, continue to monitor  CKD IIIa -Continue monitor with diuresis  Macrocytic anemia-stable -Continue monitoring repeat labs  GERD -Continue Protonix  DVT prophylaxis:  Lovenox Code Status: Full Family Communication: Tried calling daughter 5/5 with no response. Disposition Plan:  Status is: Observation  The patient will require care spanning > 2 midnights and should be moved to inpatient because: Inpatient level of care appropriate due to severity of illness  Dispo: The patient is from: Home              Anticipated d/c is to: Home              Patient currently is not medically stable to d/c.   Difficult to place patient No  Consultants:   Cardiology  Procedures:   See below  Antimicrobials:   None   Subjective: Patient seen and evaluated today with no new acute complaints or concerns. No acute concerns or events noted overnight. No chest pain noted.  Objective: Vitals:   08/19/20 1150 08/19/20 1230 08/19/20 1330 08/19/20 1400  BP: 115/64 111/68 100/78 (!) 112/56  Pulse: 66 64 70 68  Resp: 14 (!) 23 (!) 26 (!) 23  Temp:      TempSrc:      SpO2: 94% 97% 95% 97%  Weight:      Height:        Intake/Output Summary (Last 24 hours) at 08/19/2020 1455 Last data filed at 08/19/2020 1145 Gross per 24 hour  Intake 42.56 ml  Output --  Net 42.56 ml   Filed Weights   08/18/20 1515  Weight: 63.5 kg    Examination:  General exam: Appears calm and comfortable  Respiratory system: Clear to auscultation. Respiratory effort normal. On 2L [place]. Cardiovascular system: S1 & S2 heard, RRR.  Gastrointestinal system:  Abdomen is soft Central nervous system: Alert and awake Extremities: 1-2+ pitting edema bilaterally Skin: No significant lesions noted Psychiatry: Flat affect.    Data Reviewed: I have personally reviewed following labs and imaging studies  CBC: Recent Labs  Lab 08/18/20 1742 08/19/20 0340 08/19/20 0611  WBC 8.5 TEST WILL BE CREDITED  --   HGB 10.8* TEST WILL BE CREDITED 11.0*  HCT 37.3* TEST WILL BE CREDITED 37.8*  MCV 104.8* TEST WILL BE CREDITED  --   PLT 126* TEST WILL BE CREDITED  --    Basic Metabolic  Panel: Recent Labs  Lab 08/18/20 1742 08/19/20 0340  NA 142 140  K 4.7 4.1  CL 105 105  CO2 29 28  GLUCOSE 114* 96  BUN 25* 25*  CREATININE 1.47* 1.32*  CALCIUM 8.9 8.5*  MG  --  1.7  PHOS  --  3.6   GFR: Estimated Creatinine Clearance: 27.4 mL/min (A) (by C-G formula based on SCr of 1.32 mg/dL (H)). Liver Function Tests: Recent Labs  Lab 08/19/20 0340  AST 36  ALT 24  ALKPHOS 66  BILITOT 1.0  PROT 6.2*  ALBUMIN 3.0*   No results for input(s): LIPASE, AMYLASE in the last 168 hours. No results for input(s): AMMONIA in the last 168 hours. Coagulation Profile: Recent Labs  Lab 08/19/20 0340  INR 1.2   Cardiac Enzymes: No results for input(s): CKTOTAL, CKMB, CKMBINDEX, TROPONINI in the last 168 hours. BNP (last 3 results) No results for input(s): PROBNP in the last 8760 hours. HbA1C: No results for input(s): HGBA1C in the last 72 hours. CBG: Recent Labs  Lab 08/18/20 1707  GLUCAP 91   Lipid Profile: No results for input(s): CHOL, HDL, LDLCALC, TRIG, CHOLHDL, LDLDIRECT in the last 72 hours. Thyroid Function Tests: No results for input(s): TSH, T4TOTAL, FREET4, T3FREE, THYROIDAB in the last 72 hours. Anemia Panel: No results for input(s): VITAMINB12, FOLATE, FERRITIN, TIBC, IRON, RETICCTPCT in the last 72 hours. Sepsis Labs: Recent Labs  Lab 08/19/20 [id number]  PROCALCITON <0.10    Recent Results (from the past 240 hour(s))  Resp Panel by RT-PCR (Flu A&B, Covid) Nasopharyngeal Swab     Status: None   Collection Time: 08/18/20  8:53 PM   Specimen: Nasopharyngeal Swab; Nasopharyngeal(NP) swabs in vial transport medium  Result Value Ref Range Status   SARS Coronavirus 2 by RT PCR NEGATIVE NEGATIVE Final    Comment: (NOTE) SARS-CoV-2 target nucleic acids are NOT DETECTED.  The SARS-CoV-2 RNA is generally detectable in upper respiratory specimens during the acute phase of infection. The lowest concentration of SARS-CoV-2 viral copies this assay can detect  is 138 copies/mL. A negative result does not preclude SARS-Cov-2 infection and should not be used as the sole basis for treatment or other patient management decisions. A negative result may occur with  improper specimen collection/handling, submission of specimen other than nasopharyngeal swab, presence of viral mutation(s) within the areas targeted by this assay, and inadequate number of viral copies(<138 copies/mL). A negative result must be combined with clinical observations, patient history, and epidemiological information. The expected result is Negative.  Fact Sheet for Patients:  EntrepreneurPulse.com.au  Fact Sheet for Healthcare Providers:  IncredibleEmployment.be  This test is no t yet approved or cleared by the [place] FDA and  has been authorized for detection and/or diagnosis of SARS-CoV-2 by FDA under an Emergency Use Authorization (EUA). This EUA will remain  in effect (meaning this test can be used) for the duration of the COVID-19  declaration under Section 564(b)(1) of the Act, 21 U.S.C.section 360bbb-3(b)(1), unless the authorization is terminated  or revoked sooner.       Influenza A by PCR NEGATIVE NEGATIVE Final   Influenza B by PCR NEGATIVE NEGATIVE Final    Comment: (NOTE) The Xpert Xpress SARS-CoV-2/FLU/RSV plus assay is intended as an aid in the diagnosis of influenza from Nasopharyngeal swab specimens and should not be used as a sole basis for treatment. Nasal washings and aspirates are unacceptable for Xpert Xpress SARS-CoV-2/FLU/RSV testing.  Fact Sheet for Patients: EntrepreneurPulse.com.au  Fact Sheet for Healthcare Providers: IncredibleEmployment.be  This test is not yet approved or cleared by the [place] FDA and has been authorized for detection and/or diagnosis of SARS-CoV-2 by FDA under an Emergency Use Authorization (EUA). This EUA will remain in effect  (meaning this test can be used) for the duration of the COVID-19 declaration under Section 564(b)(1) of the Act, 21 U.S.C. section 360bbb-3(b)(1), unless the authorization is terminated or revoked.  Performed at [facility], [address], [place], [place] [address]          Radiology Studies: DG Chest 1 View  Result Date: 08/18/2020 CLINICAL DATA:  Near syncopal episode chest pain EXAM: CHEST  1 VIEW COMPARISON:  04/13/2020, CT chest 01/25/2019 FINDINGS: Peripheral and basilar reticular opacity and ground-glass changes, likely corresponding to interstitial lung disease on prior CT. Possible superimposed acute airspace disease at the right base and periphery of the left lower lung compared to prior. Mild cardiomegaly with aortic atherosclerosis. No pneumothorax IMPRESSION: 1. Peripheral and basilar interstitial lung disease. Possible acute superimposed infiltrates at the right base and periphery of left lung, question atypical or viral process 2. Cardiomegaly Electronically Signed   By: Donavan Foil M.D.   On: 08/18/2020 17:28   CT Head Wo Contrast  Result Date: 08/18/2020 CLINICAL DATA:  Near syncopal episode EXAM: CT HEAD WITHOUT CONTRAST TECHNIQUE: Contiguous axial images were obtained from the base of the skull through the vertex without intravenous contrast. COMPARISON:  CT brain 04/14/2019 FINDINGS: Brain: No acute territorial infarction, hemorrhage or intracranial mass. Moderate atrophy and chronic small vessel ischemic changes of the white matter. Chronic appearing lacunar infarcts or small vessel disease in the right basal ganglia and left thalamus. Stable ventricle size Vascular: No hyperdense vessels.  Carotid vascular calcification Skull: Normal. Negative for fracture or focal lesion. Sinuses/Orbits: Patchy mucosal thickening in left ethmoid sinus Other: None IMPRESSION: 1. No CT evidence for acute intracranial abnormality. 2. Atrophy and chronic small vessel ischemic change of the  white matter. Electronically Signed   By: Donavan Foil M.D.   On: 08/18/2020 17:32   CT Angio Chest PE W and/or Wo Contrast  Result Date: 08/18/2020 CLINICAL DATA:  Centralized chest pain radiating to jaw, positive D dimer EXAM: CT ANGIOGRAPHY CHEST WITH CONTRAST TECHNIQUE: Multidetector CT imaging of the chest was performed using the standard protocol during bolus administration of intravenous contrast. Multiplanar CT image reconstructions and MIPs were obtained to evaluate the vascular anatomy. CONTRAST:  62m OMNIPAQUE IOHEXOL 350 MG/ML SOLN COMPARISON:  08/18/2020, 01/25/2019 FINDINGS: Cardiovascular: This is a technically adequate evaluation of the pulmonary vasculature. No filling defects or pulmonary emboli. The heart is enlarged, marked right atrial dilatation. Reflux of contrast into the hepatic veins may reflect an element of cardiac dysfunction. Trace pericardial effusion. Normal caliber of the thoracic aorta. Diffuse atherosclerosis of the aorta and coronary vasculature. Mediastinum/Nodes: No enlarged mediastinal, hilar, or axillary lymph nodes. Thyroid gland, trachea, and esophagus  demonstrate no significant findings. Lungs/Pleura: Persistent bibasilar scarring and fibrosis. There are small bilateral pleural effusions, left greater than right. Chronic bilateral interlobular septal thickening again noted, with relative sparing of the left upper lobe, most consistent with chronic interstitial lung disease. Denser areas of consolidation within the lung bases likely reflect atelectasis. Bilateral lower lobe bronchiectasis unchanged. Central airways are patent. Upper Abdomen: Small volume ascites upper abdomen. No acute findings otherwise. Musculoskeletal: There is diffuse body wall edema. No acute or destructive bony lesions. Reconstructed images demonstrate no additional findings. Review of the MIP images confirms the above findings. IMPRESSION: 1. No evidence of pulmonary embolus. 2. Small bilateral  pleural effusions, left greater than right. 3. Chronic interstitial lung disease, with diffuse interlobular septal thickening as well as basilar predominant scarring and fibrosis. 4. Patchy bibasilar atelectasis. 5. Cardiomegaly, with evidence of right heart failure. Trace pericardial effusion. 6. Aortic Atherosclerosis (ICD10-I70.0). Coronary artery atherosclerosis. Electronically Signed   By: Randa Ngo M.D.   On: 08/18/2020 20:10   US Carotid Bilateral  Result Date: 08/19/2020 CLINICAL DATA:  Syncopal episode. History of CAD (post myocardial infarction), stroke/TIA hypertension and hyperlipidemia. EXAM: BILATERAL CAROTID DUPLEX ULTRASOUND TECHNIQUE: Pearline Cables scale imaging, color Doppler and duplex ultrasound were performed of bilateral carotid and vertebral arteries in the neck. COMPARISON:  07/23/2003 FINDINGS: Criteria: Quantification of carotid stenosis is based on velocity parameters that correlate the residual internal carotid diameter with NASCET-based stenosis levels, using the diameter of the distal internal carotid lumen as the denominator for stenosis measurement. The following velocity measurements were obtained: RIGHT ICA: 59/20 cm/sec CCA: [id number] cm/sec SYSTOLIC ICA/CCA RATIO:  1.0 ECA: 50 cm/sec LEFT ICA: 58/20 cm/sec CCA: 0000000 cm/sec SYSTOLIC ICA/CCA RATIO:  0.7 ECA: 43 cm/sec RIGHT CAROTID ARTERY: There is a moderate amount of eccentric echogenic plaque within the right carotid bulb (image 16), extending to involve the origin and proximal aspects of the right internal carotid artery (image 24), progressed compared to remote examination performed in 2005 though not resulting in elevated peak systolic velocities within the interrogated course of the right internal carotid artery to suggest a hemodynamically significant stenosis. RIGHT VERTEBRAL ARTERY:  Antegrade flow LEFT CAROTID ARTERY: The left common carotid artery is noted to be tortuous (representative image 38). There is a minimal amount  of eccentric echogenic plaque involving the mid (image 40) and distal (image 45) aspects of the left common carotid artery. There is a minimal amount of eccentric echogenic plaque the left carotid bulb (image 48 and 50), extending to involve the origin of the left internal carotid artery (image 58), progressed compared to remote examination performed in 2005 though not resulting in elevated peak systolic velocities within the interrogated course of the left internal carotid artery to suggest a hemodynamically significant stenosis. LEFT VERTEBRAL ARTERY:  Antegrade flow IMPRESSION: Minimal to moderate amount of bilateral atherosclerotic plaque, right greater than left, progressed compared to remote examination performed in 2005 though not resulting in a hemodynamically significant stenosis within either internal carotid artery. Electronically Signed   By: Sandi Mariscal M.D.   On: 08/19/2020 08:54        Scheduled Meds: . aspirin EC  81 mg Oral Daily  . atorvastatin  40 mg Oral q1800  . donepezil  10 mg Oral Daily  . enoxaparin (LOVENOX) injection  30 mg Subcutaneous Q24H  . furosemide  40 mg Intravenous Daily  . guaiFENesin  600 mg Oral BID  . ipratropium-albuterol  3 mL Nebulization TID  . isosorbide mononitrate  60 mg Oral Daily  . metoprolol tartrate  12.5 mg Oral BID  .  morphine injection  4 mg Intravenous Once  . pantoprazole  40 mg Oral Daily    LOS: 0 days    Time spent: 35 minutes    Markayla Reichart Darleen Crocker, DO [facility]  If 7PM-7AM, please contact night-coverage [email or web address] 08/19/2020, 2:55 PM

## 2020-08-19 NOTE — ED Notes (Signed)
Lunch given.

## 2020-08-19 NOTE — ED Notes (Signed)
Got pt a walker me and the RN got pt up from the recliner back to the bed

## 2020-08-19 NOTE — Progress Notes (Signed)
*  PRELIMINARY RESULTS* Echocardiogram Limited 2-D Echocardiogram has been performed.  Anthony Galvan 08/19/2020, 2:17 PM

## 2020-08-19 NOTE — Plan of Care (Signed)
  Problem: Acute Rehab PT Goals(only PT should resolve) Goal: Pt Will Go Supine/Side To Sit Outcome: Progressing Flowsheets (Taken 08/19/2020 1400) Pt will go Supine/Side to Sit:  with minimal assist  with min guard assist Goal: Patient Will Transfer Sit To/From Stand Outcome: Progressing Flowsheets (Taken 08/19/2020 1400) Patient will transfer sit to/from stand: with minimal assist Goal: Pt Will Transfer Bed To Chair/Chair To Bed Outcome: Progressing Flowsheets (Taken 08/19/2020 1400) Pt will Transfer Bed to Chair/Chair to Bed: with min assist Goal: Pt Will Ambulate Outcome: Progressing Flowsheets (Taken 08/19/2020 1400) Pt will Ambulate:  10 feet  with moderate assist  with rolling walker   2:01 PM, 08/19/20 Lonell Grandchild, MPT Physical Therapist with [facility] [phone] office [phone] mobile phone

## 2020-08-19 NOTE — Progress Notes (Signed)
Pt to unit via stretcher at this time. Pt is AxOx4. Pt is noted to have bowel movement in brief, medium mushy stool with stage 1 pressure ulcer present on admission. Pt given a bath at this time and foam dressing placed on sacrum. Pt takes pills whole. Tele #[id number] placed on patient w/o continuous pulse ox as we do not have any available at this time. Pt is hard of hearing. Pt educated to unit and how to call for help at this time, tv started for patient. Bed alarm on at this time. Pt has missing teeth with poor dental hygiene, states he has no dentures. Pt brought to unit with clothing and shoes.

## 2020-08-19 NOTE — ED Notes (Signed)
Report given to Kim on 300 

## 2020-08-19 NOTE — Evaluation (Signed)
Occupational Therapy Evaluation Patient Details Name: Anthony Galvan MRN: [id number] DOB: [birth year] Today's Date: 08/19/2020    History of Present Illness HPI: Anthony Galvan is a 87 y.o. male with medical history significant for CAD s/p stent placement, bronchiectasis, interstitial lung disease on home oxygen of 4 LPM via [place], duodenal ulcer, GERD, H. pylori gastritis, internal hemorrhoids, hyperlipidemia, CAD, chronic diastolic heart failure, pulmonary hypertension, history of pulmonary embolism who presents to the emergency department due to chest pain which started shortly prior to arrival to the ED.  Chest pain was midsternal to left-sided chest area and it was described as pressure like.  He states that he was getting into the car to go to doctor's appointment at [facility] and as he was pulling on the handle to get into the car, he ended up falling and passed out, he states that the next thing he remembered was being taken to the hospital.  Part of the history was obtained from ED physician and ED medical record.  Per report, patient was initially confused and complaining of some shoulder pain and also appeared diaphoretic.  EMS was activated and he was taken to the ED for further evaluation and management.  At baseline, patient ambulates with a wheelchair, a requires help from daughter for bathing and changing his diaper.   Clinical Impression   Pt pleasant and agreeable to OT/PT co-evaluation this date. Per pt report, pt's baseline level requires assist for ADL's, IADL's, and functional transfers. This date pt required moderate support for bed mobility and functional transfers with slow labored movement. Pt required total assist to don shoes at bed level and required use of RW for stand pivot transfer. Pt presents with generalized UE weakness with mild limitation in shoulder A/ROM. Pt will benefit from continued OT in the hospital and recommended venue below to increase strength, balance, and endurance  for safe ADL's.     Follow Up Recommendations  Home health OT;Supervision/Assistance - 24 hour    Equipment Recommendations  None recommended by OT           Precautions / Restrictions Precautions Precautions: Fall Restrictions Weight Bearing Restrictions: No      Mobility Bed Mobility Overal bed mobility: Needs Assistance Bed Mobility: Supine to Sit     Supine to sit: Min assist     General bed mobility comments: able to scoot back in chair with min A as well. Slow labored movement.    Transfers Overall transfer level: Needs assistance Equipment used: Rolling walker (2 wheeled) Transfers: Sit to/from [facility] Sit to Stand: Mod assist Stand pivot transfers: Mod assist       General transfer comment: slow labored movement    Balance Overall balance assessment: Needs assistance Sitting-balance support: No upper extremity supported;Feet supported Sitting balance-Leahy Scale: Good (fair to good) Sitting balance - Comments: EOB   Standing balance support: Bilateral upper extremity supported;During functional activity Standing balance-Leahy Scale: Poor Standing balance comment: with use of RW                           ADL either performed or assessed with clinical judgement   ADL Overall ADL's : Needs assistance/impaired                     Lower Body Dressing: Total assistance;Bed level Lower Body Dressing Details (indicate cue type and reason): donning shoes at bed level Toilet Transfer: Moderate assistance;RW;Stand-pivot Armed forces technical officer Details (  indicate cue type and reason): simulated via EOB to chair transfer with RW                 Vision Baseline Vision/History: No visual deficits Patient Visual Report: No change from baseline                  Pertinent Vitals/Pain Pain Assessment: No/denies pain     Hand Dominance Right   Extremity/Trunk Assessment Upper Extremity Assessment Upper Extremity  Assessment: Generalized weakness   Lower Extremity Assessment Lower Extremity Assessment: Defer to PT evaluation   Cervical / Trunk Assessment Cervical / Trunk Assessment: Normal   Communication Communication Communication: No difficulties   Cognition Arousal/Alertness: Awake/alert Behavior During Therapy: WFL for tasks assessed/performed Overall Cognitive Status: Within Functional Limits for tasks assessed                                                      Home Living Family/patient expects to be discharged to:: Private residence Living Arrangements: Children Available Help at Discharge: Family;Available 24 hours/day;Personal care attendant Type of Home: House Home Access: Ramped entrance     Home Layout: Multi-level Alternate Level Stairs-Number of Steps: unknown Alternate Level Stairs-Rails: Left;Right;Can reach both Bathroom Shower/Tub: Other (comment) (Pt only does bed level sponge baths.)   Bathroom Toilet:  (Pt wears diapers and no longer uses the toilet.)     Home Equipment: Walker - 2 wheels;Shower seat;Electric scooter;Hand held Tourist information centre manager - 4 wheels;Grab bars - tub/shower;Wheelchair - manual (taken per previous documentation.)  3-6 L O2 at home via nasal cannula.         Prior Functioning/Environment Level of Independence: Needs assistance  Gait / Transfers Assistance Needed: Pt reports stand pivot transfers with family assist. Pt reports that family propels pt in wheelchair for mobility. ADL's / Homemaking Assistance Needed: needs asssist            OT Problem List: Decreased strength;Decreased activity tolerance;Impaired balance (sitting and/or standing)      OT Treatment/Interventions: Self-care/ADL training;Therapeutic exercise;Therapeutic activities;Energy conservation;Balance training;Patient/family education    OT Goals(Current goals can be found in the care plan section) Acute Rehab OT Goals Patient Stated Goal:  return home with family to assist OT Goal Formulation: With patient Time For Goal Achievement: 09/02/20 Potential to Achieve Goals: Fair  OT Frequency: Min 1X/week               Co-evaluation PT/OT/SLP Co-Evaluation/Treatment: Yes Reason for Co-Treatment: To address functional/ADL transfers;Complexity of the patient's impairments (multi-system involvement)   OT goals addressed during session: ADL's and self-care;Strengthening/ROM      AM-PAC OT "6 Clicks" Daily Activity     Outcome Measure Help from another person eating meals?: A Little Help from another person taking care of personal grooming?: A Little Help from another person toileting, which includes using toliet, bedpan, or urinal?: Total Help from another person bathing (including washing, rinsing, drying)?: A Lot Help from another person to put on and taking off regular upper body clothing?: A Little Help from another person to put on and taking off regular lower body clothing?: Total 6 Click Score: 13   End of Session Equipment Utilized During Treatment: Rolling walker, oxygen  Activity Tolerance: Patient tolerated treatment well Patient left: in chair;with call bell/phone within reach;Other (comment) (docotr present in room upon exit)  OT Visit Diagnosis: Unsteadiness on feet (R26.81);Muscle weakness (generalized) (M62.81)                Time: [id number] OT Time Calculation (min): 19 min Charges:  OT General Charges $OT Visit: 1 Visit OT Evaluation $OT Eval Low Complexity: 1 Low  Raaga Maeder OT, MOT   Larey Seat 08/19/2020, 9:54 AM

## 2020-08-19 NOTE — Plan of Care (Signed)
  Problem: Acute Rehab OT Goals (only OT should resolve) Goal: Pt. Will Perform Grooming Flowsheets (Taken 08/19/2020 1000) Pt Will Perform Grooming:  with min assist  with min guard assist  sitting  bed level Goal: Pt. Will Perform Lower Body Dressing Flowsheets (Taken 08/19/2020 1000) Pt Will Perform Lower Body Dressing:  with mod assist  sitting/lateral leans  with adaptive equipment Goal: Pt. Will Transfer To Toilet Flowsheets (Taken 08/19/2020 1000) Pt Will Transfer to Toilet:  with min assist  stand pivot transfer Goal: Pt/Caregiver Will Perform Home Exercise Program Flowsheets (Taken 08/19/2020 1000) Pt/caregiver will Perform Home Exercise Program:  Increased ROM  Both right and left upper extremity  With Supervision  With minimal assist  Chenoah Mcnally OT, MOT

## 2020-08-19 NOTE — Evaluation (Signed)
Physical Therapy Evaluation Patient Details Name: Anthony Galvan MRN: [id number] DOB: 16-May-1921 Today's Date: 08/19/2020   History of Present Illness  HPI: Anthony Galvan is a 87 y.o. male with medical history significant for CAD s/p stent placement, bronchiectasis, interstitial lung disease on home oxygen of 4 LPM via [place], duodenal ulcer, GERD, H. pylori gastritis, internal hemorrhoids, hyperlipidemia, CAD, chronic diastolic heart failure, pulmonary hypertension, history of pulmonary embolism who presents to the emergency department due to chest pain which started shortly prior to arrival to the ED.  Chest pain was midsternal to left-sided chest area and it was described as pressure like.  He states that he was getting into the car to go to doctor's appointment at [facility] and as he was pulling on the handle to get into the car, he ended up falling and passed out, he states that the next thing he remembered was being taken to the hospital.  Part of the history was obtained from ED physician and ED medical record.  Per report, patient was initially confused and complaining of some shoulder pain and also appeared diaphoretic.  EMS was activated and he was taken to the ED for further evaluation and management.  At baseline, patient ambulates with a wheelchair, a requires help from daughter for bathing and changing his diaper.    Clinical Impression  Patient functioning near baseline for functional mobility and gait, normally patient uses wheelchair for mobility at home and limited to a few slow labored side steps to transfer to chair today, limited mostly due to generalized weakness and poor standing balance.  Patient tolerated sitting up in chair after therapy - RN notified.  Patient will benefit from continued physical therapy in hospital and recommended venue below to increase strength, balance, endurance for safe ADLs and gait.      Follow Up Recommendations Home health PT;Supervision for  mobility/OOB;Supervision - Intermittent    Equipment Recommendations  None recommended by PT    Recommendations for Other Services       Precautions / Restrictions Precautions Precautions: Fall Restrictions Weight Bearing Restrictions: No      Mobility  Bed Mobility Overal bed mobility: Needs Assistance Bed Mobility: Sidelying to Sit     Supine to sit: Min assist     General bed mobility comments: able to scoot back in chair with min A as well. Slow labored movement.    Transfers Overall transfer level: Needs assistance Equipment used: Rolling walker (2 wheeled) Transfers: Sit to/from [facility] Sit to Stand: Mod assist Stand pivot transfers: Mod assist       General transfer comment: slow labored movement  Ambulation/Gait Ambulation/Gait assistance: Mod assist;Max assist Gait Distance (Feet): 3 Feet Assistive device: Rolling walker (2 wheeled) Gait Pattern/deviations: Decreased step length - right;Decreased step length - left;Decreased stride length Gait velocity: slow   General Gait Details: limited to 3-4 slow labored short steps at bedside due to generalized weakness, poor standing balance  Stairs            Wheelchair Mobility    Modified Rankin (Stroke Patients Only)       Balance Overall balance assessment: Needs assistance Sitting-balance support: No upper extremity supported;Feet supported Sitting balance-Leahy Scale: Good Sitting balance - Comments: EOB   Standing balance support: Bilateral upper extremity supported;During functional activity Standing balance-Leahy Scale: Poor Standing balance comment: with use of RW  Pertinent Vitals/Pain Pain Assessment: No/denies pain    Home Living Family/patient expects to be discharged to:: Private residence Living Arrangements: Children Available Help at Discharge: Family;Available 24 hours/day;Personal care attendant Type of Home:  House Home Access: Ramped entrance     Home Layout: Multi-level Home Equipment: Walker - 2 wheels;Shower seat;Electric scooter;Hand held Tourist information centre manager - 4 wheels;Grab bars - tub/shower;Wheelchair - manual Additional Comments: Patient takes sponge baths in bed/chair and uses depends instead of going to bathroom    Prior Function Level of Independence: Needs assistance   Gait / Transfers Assistance Needed: Pt reports stand pivot transfers with family assist. Pt reports that family propels pt in wheelchair for mobility.  ADL's / Homemaking Assistance Needed: needs asssist        Hand Dominance   Dominant Hand: Right    Extremity/Trunk Assessment   Upper Extremity Assessment Upper Extremity Assessment: Defer to OT evaluation    Lower Extremity Assessment Lower Extremity Assessment: Generalized weakness    Cervical / Trunk Assessment Cervical / Trunk Assessment: Normal  Communication   Communication: No difficulties  Cognition Arousal/Alertness: Awake/alert Behavior During Therapy: WFL for tasks assessed/performed Overall Cognitive Status: Within Functional Limits for tasks assessed                                        General Comments      Exercises     Assessment/Plan    PT Assessment    PT Problem List         PT Treatment Interventions      PT Goals (Current goals can be found in the Care Plan section)  Acute Rehab PT Goals Patient Stated Goal: return home with family to assist PT Goal Formulation: With patient Time For Goal Achievement: 08/23/20 Potential to Achieve Goals: Good    Frequency     Barriers to discharge        Co-evaluation PT/OT/SLP Co-Evaluation/Treatment: Yes Reason for Co-Treatment: To address functional/ADL transfers;Complexity of the patient's impairments (multi-system involvement) PT goals addressed during session: Mobility/safety with mobility;Balance;Proper use of DME         AM-PAC PT "6  Clicks" Mobility  Outcome Measure Help needed turning from your back to your side while in a flat bed without using bedrails?: A Little Help needed moving from lying on your back to sitting on the side of a flat bed without using bedrails?: A Lot Help needed moving to and from a bed to a chair (including a wheelchair)?: A Lot Help needed standing up from a chair using your arms (e.g., wheelchair or bedside chair)?: A Lot Help needed to walk in hospital room?: A Lot Help needed climbing 3-5 steps with a railing? : Total 6 Click Score: 12    End of Session Equipment Utilized During Treatment: Oxygen Activity Tolerance: Patient tolerated treatment well;Patient limited by fatigue Patient left: in chair Nurse Communication: Mobility status      Time: [id number] PT Time Calculation (min) (ACUTE ONLY): 23 min   Charges:   PT Evaluation $PT Eval Moderate Complexity: 1 Mod PT Treatments $Therapeutic Activity: 23-37 mins        1:57 PM, 08/19/20 Lonell Grandchild, MPT Physical Therapist with [facility] [phone] office [phone] mobile phone

## 2020-08-19 NOTE — ED Notes (Addendum)
Rectal exam completed by this RN, with assistance of Anna Maria, Therapist, sports. Hemoccult NEGATIVE. Specimen sent to lab for confirmation.  Pt checked for incontinence. Pt clean at this time. Brief reapplied and pt was repositioned in bed for comfort. Warm blanket also provided. No other requests at this time. Call bell within reach, bed in low position. Will continue to monitor.

## 2020-08-19 NOTE — ED Notes (Signed)
Pt provided with cup of ice water. He denied any other needs at this time. Call bell within reach, bed in low position. Will continue to monitor.

## 2020-08-20 LAB — CBC
HCT: 34.1 % — ABNORMAL LOW (ref 39.0–52.0)
Hemoglobin: 10.1 g/dL — ABNORMAL LOW (ref 13.0–17.0)
MCH: 30 pg (ref 26.0–34.0)
MCHC: 29.6 g/dL — ABNORMAL LOW (ref 30.0–36.0)
MCV: 101.2 fL — ABNORMAL HIGH (ref 80.0–100.0)
Platelets: 120 10*3/uL — ABNORMAL LOW (ref 150–400)
RBC: 3.37 MIL/uL — ABNORMAL LOW (ref 4.22–5.81)
RDW: 13.2 % (ref 11.5–15.5)
WBC: 9.3 10*3/uL (ref 4.0–10.5)
nRBC: 0 % (ref 0.0–0.2)

## 2020-08-20 LAB — BASIC METABOLIC PANEL
Anion gap: 5 (ref 5–15)
BUN: 25 mg/dL — ABNORMAL HIGH (ref 8–23)
CO2: 32 mmol/L (ref 22–32)
Calcium: 8.8 mg/dL — ABNORMAL LOW (ref 8.9–10.3)
Chloride: 103 mmol/L (ref 98–111)
Creatinine, Ser: 1.31 mg/dL — ABNORMAL HIGH (ref 0.61–1.24)
GFR, Estimated: 50 mL/min — ABNORMAL LOW (ref >60)
Glucose, Bld: 75 mg/dL (ref 70–99)
Potassium: 4.3 mmol/L (ref 3.5–5.1)
Sodium: 140 mmol/L (ref 135–145)

## 2020-08-20 LAB — MAGNESIUM: Magnesium: 2 mg/dL (ref 1.7–2.4)

## 2020-08-20 MED ORDER — ISOSORBIDE MONONITRATE ER 60 MG PO TB24
90.0000 mg | ORAL_TABLET | Freq: Every day | ORAL | Status: DC
  Administered 2020-08-21 – 2020-08-24 (×4): 90 mg via ORAL
  Filled 2020-08-20 (×4): qty 2

## 2020-08-20 MED ORDER — FUROSEMIDE 10 MG/ML IJ SOLN
40.0000 mg | Freq: Two times a day (BID) | INTRAMUSCULAR | Status: AC
  Administered 2020-08-20: 40 mg via INTRAVENOUS
  Filled 2020-08-20 (×2): qty 4

## 2020-08-20 MED ORDER — IPRATROPIUM-ALBUTEROL 0.5-2.5 (3) MG/3ML IN SOLN
3.0000 mL | Freq: Two times a day (BID) | RESPIRATORY_TRACT | Status: DC
  Administered 2020-08-20 – 2020-08-24 (×6): 3 mL via RESPIRATORY_TRACT
  Filled 2020-08-20 (×3): qty 3
  Filled 2020-08-20: qty 30
  Filled 2020-08-20 (×3): qty 3

## 2020-08-20 MED ORDER — ISOSORBIDE MONONITRATE ER 60 MG PO TB24
30.0000 mg | ORAL_TABLET | Freq: Once | ORAL | Status: AC
  Administered 2020-08-20: 30 mg via ORAL
  Filled 2020-08-20: qty 1

## 2020-08-20 NOTE — Progress Notes (Signed)
PROGRESS NOTE    Anthony Galvan  [id number] DOB: [birth year] DOA: 08/18/2020 PCP: [facility]   Brief Narrative:   Anthony Galvan a 87 y.o.malewith medical history significant forCAD s/p stent placement,bronchiectasis, interstitial lung disease on home oxygen of 4 LPM via [place], duodenal ulcer, GERD, H. pylori gastritis, internal hemorrhoids, hyperlipidemia, CAD, chronic diastolic heart failure, pulmonary hypertension, history of pulmonary embolismwho presents to the emergency department due to chest pain which started shortly prior to arrival to the ED.   Assessment & Plan:   Principal Problem:   Chest pain Active Problems:   Hyperlipidemia with target low density lipoprotein (LDL) cholesterol less than 70 mg/dL   GERD   CAD S/P percutaneous coronary angioplasty   CKD (chronic kidney disease) stage 3, GFR 30-59 ml/min (HCC)   Interstitial lung disease ([place])   Pulmonary hypertension (HCC)   Fall at home, initial encounter   Syncope   Macrocytic anemia   Symptomatic anemia   Elevated d-dimer   (HFpEF) heart failure with preserved ejection fraction (HCC)   Acute CHF ([place])   Chest pain in the setting of known CAD -Continues to have intermittent pain -Continue aspirin, statin, Imdur, and Lopressor -Not an ideal candidate for any further testing -Increased dose of Imdur and continue diuresis per Cardiology  Acute on chronic diastolic CHF exacerbation-ongoing -Diuresis as scheduled per cardiology with IV Lasix 40 mg BID ongoing -Increase home Lasix '40mg'$  bid at discharge -Follow daily weights and I's and O's -Repeat 2D echocardiogram with LVEF 55-60%, RV overload -Wears 2L [place] at home  Syncopal episode/NSVT -Patient does have PVCs, plan to keep K at 4.0 and Mg at 2.0 -Lopressor BID as ordered -Carotid Dopplers without any significant findings  ILD with associated pulmonary hypertension -No acute issues noted, continue to monitor  CKD  IIIa-stable -Continue monitor with diuresis  Macrocytic anemia-stable -Continue monitoring repeat labs  GERD -Continue Protonix  DVT prophylaxis: Lovenox Code Status: Full Family Communication: Tried calling daughter 5/6; told this was wrong number Disposition Plan:  Status is: Inpatient  Remains inpatient appropriate because:IV treatments appropriate due to intensity of illness or inability to take PO and Inpatient level of care appropriate due to severity of illness   Dispo: The patient is from: Home              Anticipated d/c is to: Home              Patient currently is not medically stable to d/c.   Difficult to place patient No   Consultants:   Cardiology  Procedures:   See below  Antimicrobials:   None  Subjective: Patient seen and evaluated today with some recurrent chest pains noted this am. He is still having ongoing edema.  Objective: Vitals:   08/20/20 0500 08/20/20 0508 08/20/20 0707 08/20/20 0800  BP:  (!) 144/75  114/65  Pulse:  74  79  Resp:  19  19  Temp:  98 F (36.7 C)    TempSrc:      SpO2:  100% 100%   Weight: 67.2 kg     Height:        Intake/Output Summary (Last 24 hours) at 08/20/2020 1303 Last data filed at 08/20/2020 0959 Gross per 24 hour  Intake 300 ml  Output 800 ml  Net -500 ml   Filed Weights   08/18/20 1515 08/19/20 1948 08/20/20 0500  Weight: 63.5 kg 66 kg 67.2 kg    Examination:  General exam: Appears  calm and comfortable  Respiratory system: Clear to auscultation. Respiratory effort normal. On 2L [place]. Cardiovascular system: S1 & S2 heard, RRR.  Gastrointestinal system: Abdomen is soft Central nervous system: Alert and awake Extremities: 1-2+ pitting edema bilaterally Skin: No significant lesions noted Psychiatry: Flat affect.    Data Reviewed: I have personally reviewed following labs and imaging studies  CBC: Recent Labs  Lab 08/18/20 1742 08/19/20 0340 08/19/20 0611 08/20/20 0541  WBC 8.5  TEST WILL BE CREDITED  --  9.3  HGB 10.8* TEST WILL BE CREDITED 11.0* 10.1*  HCT 37.3* TEST WILL BE CREDITED 37.8* 34.1*  MCV 104.8* TEST WILL BE CREDITED  --  101.2*  PLT 126* TEST WILL BE CREDITED  --  123456*   Basic Metabolic Panel: Recent Labs  Lab 08/18/20 1742 08/19/20 0340 08/20/20 0541  NA 142 140 140  K 4.7 4.1 4.3  CL 105 105 103  CO2 29 28 32  GLUCOSE 114* 96 75  BUN 25* 25* 25*  CREATININE 1.47* 1.32* 1.31*  CALCIUM 8.9 8.5* 8.8*  MG  --  1.7 2.0  PHOS  --  3.6  --    GFR: Estimated Creatinine Clearance: 27.6 mL/min (A) (by C-G formula based on SCr of 1.31 mg/dL (H)). Liver Function Tests: Recent Labs  Lab 08/19/20 0340  AST 36  ALT 24  ALKPHOS 66  BILITOT 1.0  PROT 6.2*  ALBUMIN 3.0*   No results for input(s): LIPASE, AMYLASE in the last 168 hours. No results for input(s): AMMONIA in the last 168 hours. Coagulation Profile: Recent Labs  Lab 08/19/20 0340  INR 1.2   Cardiac Enzymes: No results for input(s): CKTOTAL, CKMB, CKMBINDEX, TROPONINI in the last 168 hours. BNP (last 3 results) No results for input(s): PROBNP in the last 8760 hours. HbA1C: No results for input(s): HGBA1C in the last 72 hours. CBG: Recent Labs  Lab 08/18/20 1707  GLUCAP 91   Lipid Profile: No results for input(s): CHOL, HDL, LDLCALC, TRIG, CHOLHDL, LDLDIRECT in the last 72 hours. Thyroid Function Tests: No results for input(s): TSH, T4TOTAL, FREET4, T3FREE, THYROIDAB in the last 72 hours. Anemia Panel: No results for input(s): VITAMINB12, FOLATE, FERRITIN, TIBC, IRON, RETICCTPCT in the last 72 hours. Sepsis Labs: Recent Labs  Lab 08/19/20 [id number]  PROCALCITON <0.10    Recent Results (from the past 240 hour(s))  Resp Panel by RT-PCR (Flu A&B, Covid) Nasopharyngeal Swab     Status: None   Collection Time: 08/18/20  8:53 PM   Specimen: Nasopharyngeal Swab; Nasopharyngeal(NP) swabs in vial transport medium  Result Value Ref Range Status   SARS Coronavirus 2 by RT PCR  NEGATIVE NEGATIVE Final    Comment: (NOTE) SARS-CoV-2 target nucleic acids are NOT DETECTED.  The SARS-CoV-2 RNA is generally detectable in upper respiratory specimens during the acute phase of infection. The lowest concentration of SARS-CoV-2 viral copies this assay can detect is 138 copies/mL. A negative result does not preclude SARS-Cov-2 infection and should not be used as the sole basis for treatment or other patient management decisions. A negative result may occur with  improper specimen collection/handling, submission of specimen other than nasopharyngeal swab, presence of viral mutation(s) within the areas targeted by this assay, and inadequate number of viral copies(<138 copies/mL). A negative result must be combined with clinical observations, patient history, and epidemiological information. The expected result is Negative.  Fact Sheet for Patients:  EntrepreneurPulse.com.au  Fact Sheet for Healthcare Providers:  IncredibleEmployment.be  This test is no t yet  approved or cleared by the [place] and  has been authorized for detection and/or diagnosis of SARS-CoV-2 by FDA under an Emergency Use Authorization (EUA). This EUA will remain  in effect (meaning this test can be used) for the duration of the COVID-19 declaration under Section 564(b)(1) of the Act, 21 U.S.C.section 360bbb-3(b)(1), unless the authorization is terminated  or revoked sooner.       Influenza A by PCR NEGATIVE NEGATIVE Final   Influenza B by PCR NEGATIVE NEGATIVE Final    Comment: (NOTE) The Xpert Xpress SARS-CoV-2/FLU/RSV plus assay is intended as an aid in the diagnosis of influenza from Nasopharyngeal swab specimens and should not be used as a sole basis for treatment. Nasal washings and aspirates are unacceptable for Xpert Xpress SARS-CoV-2/FLU/RSV testing.  Fact Sheet for Patients: EntrepreneurPulse.com.au  Fact Sheet for  Healthcare Providers: IncredibleEmployment.be  This test is not yet approved or cleared by the [place] FDA and has been authorized for detection and/or diagnosis of SARS-CoV-2 by FDA under an Emergency Use Authorization (EUA). This EUA will remain in effect (meaning this test can be used) for the duration of the COVID-19 declaration under Section 564(b)(1) of the Act, 21 U.S.C. section 360bbb-3(b)(1), unless the authorization is terminated or revoked.  Performed at [facility], [address] [place], [place] [address]          Radiology Studies: DG Chest 1 View  Result Date: 08/18/2020 CLINICAL DATA:  Near syncopal episode chest pain EXAM: CHEST  1 VIEW COMPARISON:  04/13/2020, CT chest 01/25/2019 FINDINGS: Peripheral and basilar reticular opacity and ground-glass changes, likely corresponding to interstitial lung disease on prior CT. Possible superimposed acute airspace disease at the right base and periphery of the left lower lung compared to prior. Mild cardiomegaly with aortic atherosclerosis. No pneumothorax IMPRESSION: 1. Peripheral and basilar interstitial lung disease. Possible acute superimposed infiltrates at the right base and periphery of left lung, question atypical or viral process 2. Cardiomegaly Electronically Signed   By: Donavan Foil M.D.   On: 08/18/2020 17:28   CT Head Wo Contrast  Result Date: 08/18/2020 CLINICAL DATA:  Near syncopal episode EXAM: CT HEAD WITHOUT CONTRAST TECHNIQUE: Contiguous axial images were obtained from the base of the skull through the vertex without intravenous contrast. COMPARISON:  CT brain 04/14/2019 FINDINGS: Brain: No acute territorial infarction, hemorrhage or intracranial mass. Moderate atrophy and chronic small vessel ischemic changes of the white matter. Chronic appearing lacunar infarcts or small vessel disease in the right basal ganglia and left thalamus. Stable ventricle size Vascular: No hyperdense vessels.   Carotid vascular calcification Skull: Normal. Negative for fracture or focal lesion. Sinuses/Orbits: Patchy mucosal thickening in left ethmoid sinus Other: None IMPRESSION: 1. No CT evidence for acute intracranial abnormality. 2. Atrophy and chronic small vessel ischemic change of the white matter. Electronically Signed   By: Donavan Foil M.D.   On: 08/18/2020 17:32   CT Angio Chest PE W and/or Wo Contrast  Result Date: 08/18/2020 CLINICAL DATA:  Centralized chest pain radiating to jaw, positive D dimer EXAM: CT ANGIOGRAPHY CHEST WITH CONTRAST TECHNIQUE: Multidetector CT imaging of the chest was performed using the standard protocol during bolus administration of intravenous contrast. Multiplanar CT image reconstructions and MIPs were obtained to evaluate the vascular anatomy. CONTRAST:  3m OMNIPAQUE IOHEXOL 350 MG/ML SOLN COMPARISON:  08/18/2020, 01/25/2019 FINDINGS: Cardiovascular: This is a technically adequate evaluation of the pulmonary vasculature. No filling defects or pulmonary emboli. The heart is enlarged, marked right atrial  dilatation. Reflux of contrast into the hepatic veins may reflect an element of cardiac dysfunction. Trace pericardial effusion. Normal caliber of the thoracic aorta. Diffuse atherosclerosis of the aorta and coronary vasculature. Mediastinum/Nodes: No enlarged mediastinal, hilar, or axillary lymph nodes. Thyroid gland, trachea, and esophagus demonstrate no significant findings. Lungs/Pleura: Persistent bibasilar scarring and fibrosis. There are small bilateral pleural effusions, left greater than right. Chronic bilateral interlobular septal thickening again noted, with relative sparing of the left upper lobe, most consistent with chronic interstitial lung disease. Denser areas of consolidation within the lung bases likely reflect atelectasis. Bilateral lower lobe bronchiectasis unchanged. Central airways are patent. Upper Abdomen: Small volume ascites upper abdomen. No acute  findings otherwise. Musculoskeletal: There is diffuse body wall edema. No acute or destructive bony lesions. Reconstructed images demonstrate no additional findings. Review of the MIP images confirms the above findings. IMPRESSION: 1. No evidence of pulmonary embolus. 2. Small bilateral pleural effusions, left greater than right. 3. Chronic interstitial lung disease, with diffuse interlobular septal thickening as well as basilar predominant scarring and fibrosis. 4. Patchy bibasilar atelectasis. 5. Cardiomegaly, with evidence of right heart failure. Trace pericardial effusion. 6. Aortic Atherosclerosis (ICD10-I70.0). Coronary artery atherosclerosis. Electronically Signed   By: Randa Ngo M.D.   On: 08/18/2020 20:10   US Carotid Bilateral  Result Date: 08/19/2020 CLINICAL DATA:  Syncopal episode. History of CAD (post myocardial infarction), stroke/TIA hypertension and hyperlipidemia. EXAM: BILATERAL CAROTID DUPLEX ULTRASOUND TECHNIQUE: Pearline Cables scale imaging, color Doppler and duplex ultrasound were performed of bilateral carotid and vertebral arteries in the neck. COMPARISON:  07/23/2003 FINDINGS: Criteria: Quantification of carotid stenosis is based on velocity parameters that correlate the residual internal carotid diameter with NASCET-based stenosis levels, using the diameter of the distal internal carotid lumen as the denominator for stenosis measurement. The following velocity measurements were obtained: RIGHT ICA: 59/20 cm/sec CCA: [id number] cm/sec SYSTOLIC ICA/CCA RATIO:  1.0 ECA: 50 cm/sec LEFT ICA: 58/20 cm/sec CCA: 0000000 cm/sec SYSTOLIC ICA/CCA RATIO:  0.7 ECA: 43 cm/sec RIGHT CAROTID ARTERY: There is a moderate amount of eccentric echogenic plaque within the right carotid bulb (image 16), extending to involve the origin and proximal aspects of the right internal carotid artery (image 24), progressed compared to remote examination performed in 2005 though not resulting in elevated peak systolic velocities  within the interrogated course of the right internal carotid artery to suggest a hemodynamically significant stenosis. RIGHT VERTEBRAL ARTERY:  Antegrade flow LEFT CAROTID ARTERY: The left common carotid artery is noted to be tortuous (representative image 38). There is a minimal amount of eccentric echogenic plaque involving the mid (image 40) and distal (image 45) aspects of the left common carotid artery. There is a minimal amount of eccentric echogenic plaque the left carotid bulb (image 48 and 50), extending to involve the origin of the left internal carotid artery (image 58), progressed compared to remote examination performed in 2005 though not resulting in elevated peak systolic velocities within the interrogated course of the left internal carotid artery to suggest a hemodynamically significant stenosis. LEFT VERTEBRAL ARTERY:  Antegrade flow IMPRESSION: Minimal to moderate amount of bilateral atherosclerotic plaque, right greater than left, progressed compared to remote examination performed in 2005 though not resulting in a hemodynamically significant stenosis within either internal carotid artery. Electronically Signed   By: Sandi Mariscal M.D.   On: 08/19/2020 08:54   ECHOCARDIOGRAM LIMITED  Result Date: 08/19/2020    ECHOCARDIOGRAM LIMITED REPORT   Patient Name:   Blanca Friend Date of Exam:  08/19/2020 Medical Rec #:  [id number]      Height:       64.0 in Accession #:    [id number]     Weight:       140.0 lb Date of Birth:  1922/03/23     BSA:          1.681 m Patient Age:    27 years       BP:           100/78 mmHg Patient Gender: M              HR:           70 bpm. Exam Location:  [facility] Procedure: Limited Echo, Cardiac Doppler and Limited Color Doppler Indications:    CAD S/P percutaneous coronary angioplasty  History:        Patient has prior history of Echocardiogram examinations, most                 recent 04/13/2020. CHF, CAD and Previous Myocardial Infarction;                 Risk  Factors:Hypertension and Dyslipidemia. CAD S/P percutaneous                 coronary angioplasty.  Sonographer:    Alvino Chapel RCS Referring Phys: [id number] OLADAPO ADEFESO IMPRESSIONS  1. Limited study.  2. Left ventricular ejection fraction, by estimation, is 55 to 60%. The left ventricle has normal function. The left ventricle has no regional wall motion abnormalities. There is severe concentric left ventricular hypertrophy. There is the interventricular septum is flattened in systole and diastole, consistent with right ventricular pressure and volume overload.  3. Right ventricular systolic function is low normal. The right ventricular size is mildly enlarged. There is moderately elevated pulmonary artery systolic pressure. The estimated right ventricular systolic pressure is 123XX123 mmHg.  4. Right atrial size was moderately dilated.  5. A small pericardial effusion is present. The pericardial effusion is posterior to the left ventricle.  6. The mitral valve is grossly normal. Trivial mitral valve regurgitation.  7. The aortic valve is tricuspid. There is moderate calcification of the aortic valve. Aortic valve regurgitation is mild. Aortic regurgitation PHT measures 454 msec.  8. The inferior vena cava is dilated in size with <50% respiratory variability, suggesting right atrial pressure of 15 mmHg. FINDINGS  Left Ventricle: Left ventricular ejection fraction, by estimation, is 55 to 60%. The left ventricle has normal function. The left ventricle has no regional wall motion abnormalities. The left ventricular internal cavity size was normal in size. There is  severe concentric left ventricular hypertrophy. The interventricular septum is flattened in systole and diastole, consistent with right ventricular pressure and volume overload. Right Ventricle: The right ventricular size is mildly enlarged. No increase in right ventricular wall thickness. Right ventricular systolic function is low normal. There is  moderately elevated pulmonary artery systolic pressure. The tricuspid regurgitant  velocity is 2.98 m/s, and with an assumed right atrial pressure of 15 mmHg, the estimated right ventricular systolic pressure is 123XX123 mmHg. Right Atrium: Right atrial size was moderately dilated. Pericardium: A small pericardial effusion is present. The pericardial effusion is posterior to the left ventricle. Mitral Valve: The mitral valve is grossly normal. Mild mitral annular calcification. Trivial mitral valve regurgitation. Tricuspid Valve: The tricuspid valve is grossly normal. Tricuspid valve regurgitation is mild. Aortic Valve: The aortic valve is tricuspid. There is moderate calcification of the aortic valve.  There is mild to moderate aortic valve annular calcification. Aortic valve regurgitation is mild. Aortic regurgitation PHT measures 454 msec. Aorta: The aortic root is normal in size and structure. Venous: The inferior vena cava is dilated in size with less than 50% respiratory variability, suggesting right atrial pressure of 15 mmHg. LEFT VENTRICLE PLAX 2D LVIDd:         3.40 cm LVIDs:         2.30 cm LV PW:         1.60 cm LV IVS:        1.60 cm LVOT diam:     1.65 cm LV SV:         27 LV SV Index:   16 LVOT Area:     2.14 cm  RIGHT VENTRICLE TAPSE (M-mode): 1.6 cm LEFT ATRIUM         Index LA diam:    2.90 cm 1.73 cm/m  AORTIC VALVE LVOT Vmax:   71.70 cm/s LVOT Vmean:  46.300 cm/s LVOT VTI:    0.127 m AI PHT:      454 msec  AORTA Ao Root diam: 3.30 cm MITRAL VALVE               TRICUSPID VALVE MV Area (PHT): 1.84 cm    TR Peak grad:   35.5 mmHg MV Decel Time: 412 msec    TR Vmax:        298.00 cm/s MV E velocity: 61.40 cm/s MV A velocity: 87.70 cm/s  SHUNTS MV E/A ratio:  0.70        Systemic VTI:  0.13 m                            Systemic Diam: 1.65 cm Rozann Lesches MD Electronically signed by Rozann Lesches MD Signature Date/Time: 08/19/2020/5:06:24 PM    Final         Scheduled Meds: . aspirin EC  81 mg  Oral Daily  . atorvastatin  40 mg Oral q1800  . donepezil  10 mg Oral Daily  . enoxaparin (LOVENOX) injection  30 mg Subcutaneous Q24H  . furosemide  40 mg Intravenous BID  . guaiFENesin  600 mg Oral BID  . ipratropium-albuterol  3 mL Nebulization BID  . [START ON 08/21/2020] isosorbide mononitrate  90 mg Oral Daily  . metoprolol tartrate  12.5 mg Oral BID  .  morphine injection  4 mg Intravenous Once  . pantoprazole  40 mg Oral Daily    LOS: 1 day    Time spent: 35 minutes    Osher Oettinger Darleen Crocker, DO [facility]  If 7PM-7AM, please contact night-coverage [email or web address] 08/20/2020, 1:03 PM

## 2020-08-20 NOTE — Progress Notes (Signed)
Progress Note  Patient Name: Anthony Galvan Date of Encounter: 08/20/2020  [facility] Cardiologist: Rozann Lesches, MD   Subjective   Some recurrent chest pain this AM  Inpatient Medications    Scheduled Meds: . aspirin EC  81 mg Oral Daily  . atorvastatin  40 mg Oral q1800  . donepezil  10 mg Oral Daily  . enoxaparin (LOVENOX) injection  30 mg Subcutaneous Q24H  . guaiFENesin  600 mg Oral BID  . ipratropium-albuterol  3 mL Nebulization BID  . isosorbide mononitrate  60 mg Oral Daily  . metoprolol tartrate  12.5 mg Oral BID  .  morphine injection  4 mg Intravenous Once  . pantoprazole  40 mg Oral Daily   Continuous Infusions:  PRN Meds: nitroGLYCERIN   Vital Signs    Vitals:   08/20/20 0500 08/20/20 0508 08/20/20 0707 08/20/20 0800  BP:  (!) 144/75  114/65  Pulse:  74  79  Resp:  19  19  Temp:  98 F (36.7 C)    TempSrc:      SpO2:  100% 100%   Weight: 67.2 kg     Height:        Intake/Output Summary (Last 24 hours) at 08/20/2020 1053 Last data filed at 08/20/2020 0959 Gross per 24 hour  Intake 342.56 ml  Output 800 ml  Net -457.44 ml   Last 3 Weights 08/20/2020 08/19/2020 08/18/2020  Weight (lbs) 148 lb 2.4 oz 145 lb 8.1 oz 139 lb 15.9 oz  Weight (kg) 67.2 kg 66 kg 63.5 kg      Telemetry    SR - Personally Reviewed  ECG    n/a - Personally Reviewed  Physical Exam   GEN: No acute distress.   Neck: elevated JVD Cardiac: RRR, 2/6 systolic murmur apex Respiratory: dry bilateral crackles GI: Soft, nontender, non-distended  MS: 1+ bilateral LE edema; No deformity. Neuro:  Nonfocal  Psych: Normal affect   Labs    High Sensitivity Troponin:   Recent Labs  Lab 08/18/20 1742 08/18/20 1914  TROPONINIHS 41* 62*      Chemistry Recent Labs  Lab 08/18/20 1742 08/19/20 0340 08/20/20 0541  NA 142 140 140  K 4.7 4.1 4.3  CL 105 105 103  CO2 29 28 32  GLUCOSE 114* 96 75  BUN 25* 25* 25*  CREATININE 1.47* 1.32* 1.31*  CALCIUM 8.9 8.5* 8.8*   PROT  --  6.2*  --   ALBUMIN  --  3.0*  --   AST  --  36  --   ALT  --  24  --   ALKPHOS  --  66  --   BILITOT  --  1.0  --   GFRNONAA 43* 49* 50*  ANIONGAP '8 7 5     '$ Hematology Recent Labs  Lab 08/18/20 1742 08/19/20 0340 08/19/20 0611 08/20/20 0541  WBC 8.5 TEST WILL BE CREDITED  --  9.3  RBC 3.56* TEST WILL BE CREDITED  --  3.37*  HGB 10.8* TEST WILL BE CREDITED 11.0* 10.1*  HCT 37.3* TEST WILL BE CREDITED 37.8* 34.1*  MCV 104.8* TEST WILL BE CREDITED  --  101.2*  MCH 30.3 TEST WILL BE CREDITED  --  30.0  MCHC 29.0* TEST WILL BE CREDITED  --  29.6*  RDW 13.3 TEST WILL BE CREDITED  --  13.2  PLT 126* TEST WILL BE CREDITED  --  120*    BNP Recent Labs  Lab 08/19/20 0340  BNP 962.0*     DDimer  Recent Labs  Lab 08/18/20 1742  DDIMER 3.61*     Radiology    DG Chest 1 View  Result Date: 08/18/2020 CLINICAL DATA:  Near syncopal episode chest pain EXAM: CHEST  1 VIEW COMPARISON:  04/13/2020, CT chest 01/25/2019 FINDINGS: Peripheral and basilar reticular opacity and ground-glass changes, likely corresponding to interstitial lung disease on prior CT. Possible superimposed acute airspace disease at the right base and periphery of the left lower lung compared to prior. Mild cardiomegaly with aortic atherosclerosis. No pneumothorax IMPRESSION: 1. Peripheral and basilar interstitial lung disease. Possible acute superimposed infiltrates at the right base and periphery of left lung, question atypical or viral process 2. Cardiomegaly Electronically Signed   By: Donavan Foil M.D.   On: 08/18/2020 17:28   CT Head Wo Contrast  Result Date: 08/18/2020 CLINICAL DATA:  Near syncopal episode EXAM: CT HEAD WITHOUT CONTRAST TECHNIQUE: Contiguous axial images were obtained from the base of the skull through the vertex without intravenous contrast. COMPARISON:  CT brain 04/14/2019 FINDINGS: Brain: No acute territorial infarction, hemorrhage or intracranial mass. Moderate atrophy and  chronic small vessel ischemic changes of the white matter. Chronic appearing lacunar infarcts or small vessel disease in the right basal ganglia and left thalamus. Stable ventricle size Vascular: No hyperdense vessels.  Carotid vascular calcification Skull: Normal. Negative for fracture or focal lesion. Sinuses/Orbits: Patchy mucosal thickening in left ethmoid sinus Other: None IMPRESSION: 1. No CT evidence for acute intracranial abnormality. 2. Atrophy and chronic small vessel ischemic change of the white matter. Electronically Signed   By: Donavan Foil M.D.   On: 08/18/2020 17:32   CT Angio Chest PE W and/or Wo Contrast  Result Date: 08/18/2020 CLINICAL DATA:  Centralized chest pain radiating to jaw, positive D dimer EXAM: CT ANGIOGRAPHY CHEST WITH CONTRAST TECHNIQUE: Multidetector CT imaging of the chest was performed using the standard protocol during bolus administration of intravenous contrast. Multiplanar CT image reconstructions and MIPs were obtained to evaluate the vascular anatomy. CONTRAST:  20m OMNIPAQUE IOHEXOL 350 MG/ML SOLN COMPARISON:  08/18/2020, 01/25/2019 FINDINGS: Cardiovascular: This is a technically adequate evaluation of the pulmonary vasculature. No filling defects or pulmonary emboli. The heart is enlarged, marked right atrial dilatation. Reflux of contrast into the hepatic veins may reflect an element of cardiac dysfunction. Trace pericardial effusion. Normal caliber of the thoracic aorta. Diffuse atherosclerosis of the aorta and coronary vasculature. Mediastinum/Nodes: No enlarged mediastinal, hilar, or axillary lymph nodes. Thyroid gland, trachea, and esophagus demonstrate no significant findings. Lungs/Pleura: Persistent bibasilar scarring and fibrosis. There are small bilateral pleural effusions, left greater than right. Chronic bilateral interlobular septal thickening again noted, with relative sparing of the left upper lobe, most consistent with chronic interstitial lung  disease. Denser areas of consolidation within the lung bases likely reflect atelectasis. Bilateral lower lobe bronchiectasis unchanged. Central airways are patent. Upper Abdomen: Small volume ascites upper abdomen. No acute findings otherwise. Musculoskeletal: There is diffuse body wall edema. No acute or destructive bony lesions. Reconstructed images demonstrate no additional findings. Review of the MIP images confirms the above findings. IMPRESSION: 1. No evidence of pulmonary embolus. 2. Small bilateral pleural effusions, left greater than right. 3. Chronic interstitial lung disease, with diffuse interlobular septal thickening as well as basilar predominant scarring and fibrosis. 4. Patchy bibasilar atelectasis. 5. Cardiomegaly, with evidence of right heart failure. Trace pericardial effusion. 6. Aortic Atherosclerosis (ICD10-I70.0). Coronary artery atherosclerosis. Electronically Signed   By: MDiana EvesD.  On: 08/18/2020 20:10   US Carotid Bilateral  Result Date: 08/19/2020 CLINICAL DATA:  Syncopal episode. History of CAD (post myocardial infarction), stroke/TIA hypertension and hyperlipidemia. EXAM: BILATERAL CAROTID DUPLEX ULTRASOUND TECHNIQUE: Pearline Cables scale imaging, color Doppler and duplex ultrasound were performed of bilateral carotid and vertebral arteries in the neck. COMPARISON:  07/23/2003 FINDINGS: Criteria: Quantification of carotid stenosis is based on velocity parameters that correlate the residual internal carotid diameter with NASCET-based stenosis levels, using the diameter of the distal internal carotid lumen as the denominator for stenosis measurement. The following velocity measurements were obtained: RIGHT ICA: 59/20 cm/sec CCA: [id number] cm/sec SYSTOLIC ICA/CCA RATIO:  1.0 ECA: 50 cm/sec LEFT ICA: 58/20 cm/sec CCA: 0000000 cm/sec SYSTOLIC ICA/CCA RATIO:  0.7 ECA: 43 cm/sec RIGHT CAROTID ARTERY: There is a moderate amount of eccentric echogenic plaque within the right carotid bulb (image  16), extending to involve the origin and proximal aspects of the right internal carotid artery (image 24), progressed compared to remote examination performed in 2005 though not resulting in elevated peak systolic velocities within the interrogated course of the right internal carotid artery to suggest a hemodynamically significant stenosis. RIGHT VERTEBRAL ARTERY:  Antegrade flow LEFT CAROTID ARTERY: The left common carotid artery is noted to be tortuous (representative image 38). There is a minimal amount of eccentric echogenic plaque involving the mid (image 40) and distal (image 45) aspects of the left common carotid artery. There is a minimal amount of eccentric echogenic plaque the left carotid bulb (image 48 and 50), extending to involve the origin of the left internal carotid artery (image 58), progressed compared to remote examination performed in 2005 though not resulting in elevated peak systolic velocities within the interrogated course of the left internal carotid artery to suggest a hemodynamically significant stenosis. LEFT VERTEBRAL ARTERY:  Antegrade flow IMPRESSION: Minimal to moderate amount of bilateral atherosclerotic plaque, right greater than left, progressed compared to remote examination performed in 2005 though not resulting in a hemodynamically significant stenosis within either internal carotid artery. Electronically Signed   By: Sandi Mariscal M.D.   On: 08/19/2020 08:54   ECHOCARDIOGRAM LIMITED  Result Date: 08/19/2020    ECHOCARDIOGRAM LIMITED REPORT   Patient Name:   Anthony Galvan Date of Exam: 08/19/2020 Medical Rec #:  [id number]      Height:       64.0 in Accession #:    [id number]     Weight:       140.0 lb Date of Birth:  11-01-1921     BSA:          1.681 m Patient Age:    13 years       BP:           100/78 mmHg Patient Gender: M              HR:           70 bpm. Exam Location:  [facility] Procedure: Limited Echo, Cardiac Doppler and Limited Color Doppler Indications:    CAD  S/P percutaneous coronary angioplasty  History:        Patient has prior history of Echocardiogram examinations, most                 recent 04/13/2020. CHF, CAD and Previous Myocardial Infarction;                 Risk Factors:Hypertension and Dyslipidemia. CAD S/P percutaneous  coronary angioplasty.  Sonographer:    Alvino Chapel RCS Referring Phys: [id number] OLADAPO ADEFESO IMPRESSIONS  1. Limited study.  2. Left ventricular ejection fraction, by estimation, is 55 to 60%. The left ventricle has normal function. The left ventricle has no regional wall motion abnormalities. There is severe concentric left ventricular hypertrophy. There is the interventricular septum is flattened in systole and diastole, consistent with right ventricular pressure and volume overload.  3. Right ventricular systolic function is low normal. The right ventricular size is mildly enlarged. There is moderately elevated pulmonary artery systolic pressure. The estimated right ventricular systolic pressure is 123XX123 mmHg.  4. Right atrial size was moderately dilated.  5. A small pericardial effusion is present. The pericardial effusion is posterior to the left ventricle.  6. The mitral valve is grossly normal. Trivial mitral valve regurgitation.  7. The aortic valve is tricuspid. There is moderate calcification of the aortic valve. Aortic valve regurgitation is mild. Aortic regurgitation PHT measures 454 msec.  8. The inferior vena cava is dilated in size with <50% respiratory variability, suggesting right atrial pressure of 15 mmHg. FINDINGS  Left Ventricle: Left ventricular ejection fraction, by estimation, is 55 to 60%. The left ventricle has normal function. The left ventricle has no regional wall motion abnormalities. The left ventricular internal cavity size was normal in size. There is  severe concentric left ventricular hypertrophy. The interventricular septum is flattened in systole and diastole, consistent with right  ventricular pressure and volume overload. Right Ventricle: The right ventricular size is mildly enlarged. No increase in right ventricular wall thickness. Right ventricular systolic function is low normal. There is moderately elevated pulmonary artery systolic pressure. The tricuspid regurgitant  velocity is 2.98 m/s, and with an assumed right atrial pressure of 15 mmHg, the estimated right ventricular systolic pressure is 123XX123 mmHg. Right Atrium: Right atrial size was moderately dilated. Pericardium: A small pericardial effusion is present. The pericardial effusion is posterior to the left ventricle. Mitral Valve: The mitral valve is grossly normal. Mild mitral annular calcification. Trivial mitral valve regurgitation. Tricuspid Valve: The tricuspid valve is grossly normal. Tricuspid valve regurgitation is mild. Aortic Valve: The aortic valve is tricuspid. There is moderate calcification of the aortic valve. There is mild to moderate aortic valve annular calcification. Aortic valve regurgitation is mild. Aortic regurgitation PHT measures 454 msec. Aorta: The aortic root is normal in size and structure. Venous: The inferior vena cava is dilated in size with less than 50% respiratory variability, suggesting right atrial pressure of 15 mmHg. LEFT VENTRICLE PLAX 2D LVIDd:         3.40 cm LVIDs:         2.30 cm LV PW:         1.60 cm LV IVS:        1.60 cm LVOT diam:     1.65 cm LV SV:         27 LV SV Index:   16 LVOT Area:     2.14 cm  RIGHT VENTRICLE TAPSE (M-mode): 1.6 cm LEFT ATRIUM         Index LA diam:    2.90 cm 1.73 cm/m  AORTIC VALVE LVOT Vmax:   71.70 cm/s LVOT Vmean:  46.300 cm/s LVOT VTI:    0.127 m AI PHT:      454 msec  AORTA Ao Root diam: 3.30 cm MITRAL VALVE               TRICUSPID VALVE MV Area (PHT):  1.84 cm    TR Peak grad:   35.5 mmHg MV Decel Time: 412 msec    TR Vmax:        298.00 cm/s MV E velocity: 61.40 cm/s MV A velocity: 87.70 cm/s  SHUNTS MV E/A ratio:  0.70        Systemic VTI:  0.13 m                             Systemic Diam: 1.65 cm Rozann Lesches MD Electronically signed by Rozann Lesches MD Signature Date/Time: 08/19/2020/5:06:24 PM    Final     Cardiac Studies     Patient Profile     Anthony Galvan is a 87 y.o. male with past medical history of CAD (s/p BMS to LAD in 2014, repeat cath in 10/2016 showing patent stent and 80% distal LCx stenosis not suitable for PCI and medical management recommended),ILD (on 3L [place] at baseline),chronic diastolic CHF,pulmonary HTN, moderate to severe TR,acute PE (diagnosed in 05/2018- no longer on anticoagulation),HTN, HLD,GERDand Stage 3 CKD who is being seen today for the evaluation of chest pain at the request of Dr. Josephine Cables.  Assessment & Plan    1. CAD/Chest pain - - He is s/p BMS to LAD in 2014 with repeat cath in 10/2016 showing patent stent and 80% distal LCx stenosis not suitable for PCI and medical management was recommended - mild trop up to 62, EKG without specific ischemic changes - Echo LVEF 55-60%, no WMAs, RV pressure/volume overload.   - chest pain with mild trop elevation/nonsichemic EKG in patient with known ostial LCX disease not amenable PCi also presenting with fluid overload, 87 yo with multiple medical comorbidities including ILD on home O2 and some CKD. No plans for ischemic testing - continue medical therapy with ASA 81, atorva 40, lopressor 12.'5mg'$  bid, imdur 60. No ACE/ARB due to renal dysfunction  - some mild recurrent chest pain this AM, somewhat atypical in that it lasted 1.5 hours constant - will try increasing imdur, continue diuresis and monitor symptoms.   2. Acute on chronic diastolic HF - BNP [id number], CT small bilateral effusions - on  IV lasix '40mg'$  daily. I/Os are incomplete. Downtrend in Cr with diuresis consistent with venous congestion and CHF  -remains significantly volume overloaded, dose IV lasix '40mg'$  bid and reassess tomorrow - would increase home diuretic to '40mg'$  bid at discharge.    3. ILD - on home O2 3-4L  4. NSVT - 20 beat run this admission. Was off his lopressor at the time.  - no recurrence by tele check this AM.   5. Fall - unclear history, at this time not clear if any related syncope -  No further cardiac workup at this time    For questions or updates, please contact [facility] Please consult [email or web address] for contact info under        Signed, Carlyle Dolly, MD  08/20/2020, 10:53 AM

## 2020-08-20 NOTE — Progress Notes (Signed)
Pt family called front desk and said "pt is having chest pain" nurse assessed pt, vitals WDL, Pt stated he had chest pain but it was gone by time the nurse entered the room. Pt stated pain went from a 7 to a 3 and the resolved. PT denies any SOB, is alert and orientedx4. MD notifed.

## 2020-08-21 LAB — BASIC METABOLIC PANEL
Anion gap: 7 (ref 5–15)
BUN: 27 mg/dL — ABNORMAL HIGH (ref 8–23)
CO2: 32 mmol/L (ref 22–32)
Calcium: 8.6 mg/dL — ABNORMAL LOW (ref 8.9–10.3)
Chloride: 100 mmol/L (ref 98–111)
Creatinine, Ser: 1.31 mg/dL — ABNORMAL HIGH (ref 0.61–1.24)
GFR, Estimated: 50 mL/min — ABNORMAL LOW (ref >60)
Glucose, Bld: 73 mg/dL (ref 70–99)
Potassium: 3.9 mmol/L (ref 3.5–5.1)
Sodium: 139 mmol/L (ref 135–145)

## 2020-08-21 LAB — MAGNESIUM: Magnesium: 1.8 mg/dL (ref 1.7–2.4)

## 2020-08-21 MED ORDER — FUROSEMIDE 10 MG/ML IJ SOLN
40.0000 mg | Freq: Two times a day (BID) | INTRAMUSCULAR | Status: DC
  Administered 2020-08-21 – 2020-08-23 (×5): 40 mg via INTRAVENOUS
  Filled 2020-08-21 (×5): qty 4

## 2020-08-21 NOTE — Progress Notes (Signed)
PROGRESS NOTE    Anthony Galvan  [id number] DOB: 1921-12-27 DOA: 08/18/2020 PCP: [facility]   Brief Narrative:   Anthony Galvan a 87 y.o.malewith medical history significant forCAD s/p stent placement,bronchiectasis, interstitial lung disease on home oxygen of 4 LPM via [place], duodenal ulcer, GERD, H. pylori gastritis, internal hemorrhoids, hyperlipidemia, CAD, chronic diastolic heart failure, pulmonary hypertension, history of pulmonary embolismwho presents to the emergency department due to chest pain which started shortly prior to arrival to the ED.He continues to have volume overload and is diuresing well with lasix.  Assessment & Plan:   Principal Problem:   Chest pain Active Problems:   Hyperlipidemia with target low density lipoprotein (LDL) cholesterol less than 70 mg/dL   GERD   CAD S/P percutaneous coronary angioplasty   CKD (chronic kidney disease) stage 3, GFR 30-59 ml/min (HCC)   Interstitial lung disease ([place])   Pulmonary hypertension (HCC)   Fall at home, initial encounter   Syncope   Macrocytic anemia   Symptomatic anemia   Elevated d-dimer   (HFpEF) heart failure with preserved ejection fraction (HCC)   Acute CHF (HCC)   Chest pain in the setting of known CAD -Continues to have intermittent pain -Continue aspirin, statin, Imdur, and Lopressor -Not an ideal candidate for any further testing -Increased dose of Imdur and continue diuresis today  Acute on chronic diastolic CHF exacerbation-ongoing -Continues to have significant volume overload -Negative 1.4L in last 24 hours -Diuresis as scheduled per cardiology with IV Lasix 40 mg BID ongoing -Increase home Lasix '40mg'$  bid at discharge -Follow daily weights and I's and O's -Repeat 2D echocardiogram with LVEF 55-60%, RV overload -Wears 2L [place] at home  Syncopal episode/NSVT -Anthony does have PVCs, plan to keep K at 4.0 and Mg at 2.0 -Lopressor BID as ordered -CarotidDopplers  without any significant findings  ILDwith associated pulmonary hypertension -No acute issues noted, continue to monitor  CKD IIIa-stable -Continue monitor with diuresis  Macrocytic anemia-stable -Continue monitoring repeat labs  GERD -Continue Protonix  DVT prophylaxis:Lovenox Code Status:Full Family Communication:Tried calling daughter 5/6; told this was wrong number Disposition Plan: Status is: Inpatient  Remains inpatient appropriate because:IV treatments appropriate due to intensity of illness or inability to take PO and Inpatient level of care appropriate due to severity of illness   Dispo: The Anthony is from: Home  Anticipated d/c is to: Home  Anthony currently is not medically stable to d/c.              Difficult to place Anthony No   Consultants:  Cardiology  Procedures:  See below  Antimicrobials:  None  Skin Assessment:  I have examined the Anthony's skin and I agree with the wound assessment as performed by the wound care RN as outlined below:  Pressure Injury 08/19/20 Sacrum Right;Left;Mid Stage 1 -  Intact skin with non-blanchable redness of a localized area usually over a bony prominence. Stage 1 non blanchable pressure ulcer (Active)  08/19/20 1940  Location: Sacrum  Location Orientation: Right;Left;Mid  Staging: Stage 1 -  Intact skin with non-blanchable redness of a localized area usually over a bony prominence.  Wound Description (Comments): Stage 1 non blanchable pressure ulcer  Present on Admission: Yes    Subjective: Anthony seen and evaluated today with no new acute complaints or concerns. No acute concerns or events noted overnight.  Objective: Vitals:   08/20/20 2027 08/21/20 0432 08/21/20 0438 08/21/20 0738  BP: 131/68 118/66    Pulse: 83 73  Resp: 18 14    Temp: 99 F (37.2 C) 97.7 F (36.5 C)    TempSrc: Oral Oral    SpO2: 100% 97%  94%  Weight:   67.2 kg   Height:         Intake/Output Summary (Last 24 hours) at 08/21/2020 1337 Last data filed at 08/21/2020 1146 Gross per 24 hour  Intake 120 ml  Output 1550 ml  Net -1430 ml   Filed Weights   08/19/20 1948 08/20/20 0500 08/21/20 0438  Weight: 66 kg 67.2 kg 67.2 kg    Examination:  General exam: Appears calm and comfortable  Respiratory system: Clear to auscultation. Respiratory effort normal. On 2L [place] Cardiovascular system: S1 & S2 heard, RRR.  Gastrointestinal system: Abdomen is soft Central nervous system: Alert and awake Extremities: Ongoing 1-2+ pitting edema to BLE Skin: No significant lesions noted Psychiatry: Appropriate affect.    Data Reviewed: I have personally reviewed following labs and imaging studies  CBC: Recent Labs  Lab 08/18/20 1742 08/19/20 0340 08/19/20 0611 08/20/20 0541  WBC 8.5 TEST WILL BE CREDITED  --  9.3  HGB 10.8* TEST WILL BE CREDITED 11.0* 10.1*  HCT 37.3* TEST WILL BE CREDITED 37.8* 34.1*  MCV 104.8* TEST WILL BE CREDITED  --  101.2*  PLT 126* TEST WILL BE CREDITED  --  123456*   Basic Metabolic Panel: Recent Labs  Lab 08/18/20 1742 08/19/20 0340 08/20/20 0541 08/21/20 0513  NA 142 140 140 139  K 4.7 4.1 4.3 3.9  CL 105 105 103 100  CO2 29 28 32 32  GLUCOSE 114* 96 75 73  BUN 25* 25* 25* 27*  CREATININE 1.47* 1.32* 1.31* 1.31*  CALCIUM 8.9 8.5* 8.8* 8.6*  MG  --  1.7 2.0 1.8  PHOS  --  3.6  --   --    GFR: Estimated Creatinine Clearance: 27.6 mL/min (A) (by C-G formula based on SCr of 1.31 mg/dL (H)). Liver Function Tests: Recent Labs  Lab 08/19/20 0340  AST 36  ALT 24  ALKPHOS 66  BILITOT 1.0  PROT 6.2*  ALBUMIN 3.0*   No results for input(s): LIPASE, AMYLASE in the last 168 hours. No results for input(s): AMMONIA in the last 168 hours. Coagulation Profile: Recent Labs  Lab 08/19/20 0340  INR 1.2   Cardiac Enzymes: No results for input(s): CKTOTAL, CKMB, CKMBINDEX, TROPONINI in the last 168 hours. BNP (last 3 results) No  results for input(s): PROBNP in the last 8760 hours. HbA1C: No results for input(s): HGBA1C in the last 72 hours. CBG: Recent Labs  Lab 08/18/20 1707  GLUCAP 91   Lipid Profile: No results for input(s): CHOL, HDL, LDLCALC, TRIG, CHOLHDL, LDLDIRECT in the last 72 hours. Thyroid Function Tests: No results for input(s): TSH, T4TOTAL, FREET4, T3FREE, THYROIDAB in the last 72 hours. Anemia Panel: No results for input(s): VITAMINB12, FOLATE, FERRITIN, TIBC, IRON, RETICCTPCT in the last 72 hours. Sepsis Labs: Recent Labs  Lab 08/19/20 [id number]  PROCALCITON <0.10    Recent Results (from the past 240 hour(s))  Resp Panel by RT-PCR (Flu A&B, Covid) Nasopharyngeal Swab     Status: None   Collection Time: 08/18/20  8:53 PM   Specimen: Nasopharyngeal Swab; Nasopharyngeal(NP) swabs in vial transport medium  Result Value Ref Range Status   SARS Coronavirus 2 by RT PCR NEGATIVE NEGATIVE Final    Comment: (NOTE) SARS-CoV-2 target nucleic acids are NOT DETECTED.  The SARS-CoV-2 RNA is generally detectable in upper respiratory specimens during  the acute phase of infection. The lowest concentration of SARS-CoV-2 viral copies this assay can detect is 138 copies/mL. A negative result does not preclude SARS-Cov-2 infection and should not be used as the sole basis for treatment or other Anthony management decisions. A negative result may occur with  improper specimen collection/handling, submission of specimen other than nasopharyngeal swab, presence of viral mutation(s) within the areas targeted by this assay, and inadequate number of viral copies(<138 copies/mL). A negative result must be combined with clinical observations, Anthony history, and epidemiological information. The expected result is Negative.  Fact Sheet for Patients:  EntrepreneurPulse.com.au  Fact Sheet for Healthcare Providers:  IncredibleEmployment.be  This test is no t yet approved or  cleared by the [place] FDA and  has been authorized for detection and/or diagnosis of SARS-CoV-2 by FDA under an Emergency Use Authorization (EUA). This EUA will remain  in effect (meaning this test can be used) for the duration of the COVID-19 declaration under Section 564(b)(1) of the Act, 21 U.S.C.section 360bbb-3(b)(1), unless the authorization is terminated  or revoked sooner.       Influenza A by PCR NEGATIVE NEGATIVE Final   Influenza B by PCR NEGATIVE NEGATIVE Final    Comment: (NOTE) The Xpert Xpress SARS-CoV-2/FLU/RSV plus assay is intended as an aid in the diagnosis of influenza from Nasopharyngeal swab specimens and should not be used as a sole basis for treatment. Nasal washings and aspirates are unacceptable for Xpert Xpress SARS-CoV-2/FLU/RSV testing.  Fact Sheet for Patients: EntrepreneurPulse.com.au  Fact Sheet for Healthcare Providers: IncredibleEmployment.be  This test is not yet approved or cleared by the [place] FDA and has been authorized for detection and/or diagnosis of SARS-CoV-2 by FDA under an Emergency Use Authorization (EUA). This EUA will remain in effect (meaning this test can be used) for the duration of the COVID-19 declaration under Section 564(b)(1) of the Act, 21 U.S.C. section 360bbb-3(b)(1), unless the authorization is terminated or revoked.  Performed at [facility], [address] [place], [place] [address]          Radiology Studies: ECHOCARDIOGRAM LIMITED  Result Date: 08/19/2020    ECHOCARDIOGRAM LIMITED REPORT   Anthony Galvan Date of Exam: 08/19/2020 Medical Rec #:  [id number]      Height:       64.0 in Accession #:    [id number]     Weight:       140.0 lb Date of Birth:  06/16/1921     BSA:          1.681 m Anthony Age:    68 years       BP:           100/78 mmHg Anthony Gender: M              HR:           70 bpm. Exam Location:  [facility] Procedure: Limited Echo,  Cardiac Doppler and Limited Color Doppler Indications:    CAD S/P percutaneous coronary angioplasty  History:        Anthony has prior history of Echocardiogram examinations, most                 recent 04/13/2020. CHF, CAD and Previous Myocardial Infarction;                 Risk Factors:Hypertension and Dyslipidemia. CAD S/P percutaneous  coronary angioplasty.  Sonographer:    Alvino Chapel RCS Referring Phys: [id number] OLADAPO ADEFESO IMPRESSIONS  1. Limited study.  2. Left ventricular ejection fraction, by estimation, is 55 to 60%. The left ventricle has normal function. The left ventricle has no regional wall motion abnormalities. There is severe concentric left ventricular hypertrophy. There is the interventricular septum is flattened in systole and diastole, consistent with right ventricular pressure and volume overload.  3. Right ventricular systolic function is low normal. The right ventricular size is mildly enlarged. There is moderately elevated pulmonary artery systolic pressure. The estimated right ventricular systolic pressure is 123XX123 mmHg.  4. Right atrial size was moderately dilated.  5. A small pericardial effusion is present. The pericardial effusion is posterior to the left ventricle.  6. The mitral valve is grossly normal. Trivial mitral valve regurgitation.  7. The aortic valve is tricuspid. There is moderate calcification of the aortic valve. Aortic valve regurgitation is mild. Aortic regurgitation PHT measures 454 msec.  8. The inferior vena cava is dilated in size with <50% respiratory variability, suggesting right atrial pressure of 15 mmHg. FINDINGS  Left Ventricle: Left ventricular ejection fraction, by estimation, is 55 to 60%. The left ventricle has normal function. The left ventricle has no regional wall motion abnormalities. The left ventricular internal cavity size was normal in size. There is  severe concentric left ventricular hypertrophy. The interventricular septum is  flattened in systole and diastole, consistent with right ventricular pressure and volume overload. Right Ventricle: The right ventricular size is mildly enlarged. No increase in right ventricular wall thickness. Right ventricular systolic function is low normal. There is moderately elevated pulmonary artery systolic pressure. The tricuspid regurgitant  velocity is 2.98 m/s, and with an assumed right atrial pressure of 15 mmHg, the estimated right ventricular systolic pressure is 123XX123 mmHg. Right Atrium: Right atrial size was moderately dilated. Pericardium: A small pericardial effusion is present. The pericardial effusion is posterior to the left ventricle. Mitral Valve: The mitral valve is grossly normal. Mild mitral annular calcification. Trivial mitral valve regurgitation. Tricuspid Valve: The tricuspid valve is grossly normal. Tricuspid valve regurgitation is mild. Aortic Valve: The aortic valve is tricuspid. There is moderate calcification of the aortic valve. There is mild to moderate aortic valve annular calcification. Aortic valve regurgitation is mild. Aortic regurgitation PHT measures 454 msec. Aorta: The aortic root is normal in size and structure. Venous: The inferior vena cava is dilated in size with less than 50% respiratory variability, suggesting right atrial pressure of 15 mmHg. LEFT VENTRICLE PLAX 2D LVIDd:         3.40 cm LVIDs:         2.30 cm LV PW:         1.60 cm LV IVS:        1.60 cm LVOT diam:     1.65 cm LV SV:         27 LV SV Index:   16 LVOT Area:     2.14 cm  RIGHT VENTRICLE TAPSE (M-mode): 1.6 cm LEFT ATRIUM         Index LA diam:    2.90 cm 1.73 cm/m  AORTIC VALVE LVOT Vmax:   71.70 cm/s LVOT Vmean:  46.300 cm/s LVOT VTI:    0.127 m AI PHT:      454 msec  AORTA Ao Root diam: 3.30 cm MITRAL VALVE               TRICUSPID VALVE MV Area (PHT):  1.84 cm    TR Peak grad:   35.5 mmHg MV Decel Time: 412 msec    TR Vmax:        298.00 cm/s MV E velocity: 61.40 cm/s MV A velocity: 87.70 cm/s   SHUNTS MV E/A ratio:  0.70        Systemic VTI:  0.13 m                            Systemic Diam: 1.65 cm Rozann Lesches MD Electronically signed by Rozann Lesches MD Signature Date/Time: 08/19/2020/5:06:24 PM    Final         Scheduled Meds: . aspirin EC  81 mg Oral Daily  . atorvastatin  40 mg Oral q1800  . donepezil  10 mg Oral Daily  . enoxaparin (LOVENOX) injection  30 mg Subcutaneous Q24H  . furosemide  40 mg Intravenous Q12H  . guaiFENesin  600 mg Oral BID  . ipratropium-albuterol  3 mL Nebulization BID  . isosorbide mononitrate  90 mg Oral Daily  . metoprolol tartrate  12.5 mg Oral BID  .  morphine injection  4 mg Intravenous Once  . pantoprazole  40 mg Oral Daily    LOS: 2 days    Time spent: 35 minutes    Laquetta Racey Darleen Crocker, DO [facility]  If 7PM-7AM, please contact night-coverage [email or web address] 08/21/2020, 1:37 PM

## 2020-08-21 NOTE — Progress Notes (Signed)
tele just called and states pt is having episodes of non continuous 2nd degree type II heart block. currently he is in 1st degree with a rate of 96, started on day shift. Started at 2343. Pt is asymptomatic at this time.    MD on call made aware.

## 2020-08-22 ENCOUNTER — Inpatient Hospital Stay (HOSPITAL_COMMUNITY): Payer: No Typology Code available for payment source

## 2020-08-22 DIAGNOSIS — N1832 Chronic kidney disease, stage 3b: Secondary | ICD-10-CM

## 2020-08-22 DIAGNOSIS — R55 Syncope and collapse: Secondary | ICD-10-CM

## 2020-08-22 DIAGNOSIS — I5030 Unspecified diastolic (congestive) heart failure: Secondary | ICD-10-CM

## 2020-08-22 LAB — CBC
HCT: 32.6 % — ABNORMAL LOW (ref 39.0–52.0)
Hemoglobin: 9.9 g/dL — ABNORMAL LOW (ref 13.0–17.0)
MCH: 29.7 pg (ref 26.0–34.0)
MCHC: 30.4 g/dL (ref 30.0–36.0)
MCV: 97.9 fL (ref 80.0–100.0)
Platelets: 127 10*3/uL — ABNORMAL LOW (ref 150–400)
RBC: 3.33 MIL/uL — ABNORMAL LOW (ref 4.22–5.81)
RDW: 13.1 % (ref 11.5–15.5)
WBC: 11.9 10*3/uL — ABNORMAL HIGH (ref 4.0–10.5)
nRBC: 0 % (ref 0.0–0.2)

## 2020-08-22 LAB — RENAL FUNCTION PANEL
Albumin: 3.2 g/dL — ABNORMAL LOW (ref 3.5–5.0)
Anion gap: 8 (ref 5–15)
BUN: 27 mg/dL — ABNORMAL HIGH (ref 8–23)
CO2: 34 mmol/L — ABNORMAL HIGH (ref 22–32)
Calcium: 8.4 mg/dL — ABNORMAL LOW (ref 8.9–10.3)
Chloride: 96 mmol/L — ABNORMAL LOW (ref 98–111)
Creatinine, Ser: 1.33 mg/dL — ABNORMAL HIGH (ref 0.61–1.24)
GFR, Estimated: 49 mL/min — ABNORMAL LOW (ref >60)
Glucose, Bld: 76 mg/dL (ref 70–99)
Phosphorus: 2.9 mg/dL (ref 2.5–4.6)
Potassium: 4.1 mmol/L (ref 3.5–5.1)
Sodium: 138 mmol/L (ref 135–145)

## 2020-08-22 LAB — MAGNESIUM
Magnesium: 1.7 mg/dL (ref 1.7–2.4)
Magnesium: 1.6 mg/dL — ABNORMAL LOW (ref 1.7–2.4)

## 2020-08-22 LAB — GLUCOSE, CAPILLARY: Glucose-Capillary: 112 mg/dL — ABNORMAL HIGH (ref 70–99)

## 2020-08-22 LAB — BASIC METABOLIC PANEL
Anion gap: 7 (ref 5–15)
BUN: 27 mg/dL — ABNORMAL HIGH (ref 8–23)
CO2: 35 mmol/L — ABNORMAL HIGH (ref 22–32)
Calcium: 8.3 mg/dL — ABNORMAL LOW (ref 8.9–10.3)
Chloride: 94 mmol/L — ABNORMAL LOW (ref 98–111)
Creatinine, Ser: 1.27 mg/dL — ABNORMAL HIGH (ref 0.61–1.24)
GFR, Estimated: 52 mL/min — ABNORMAL LOW (ref >60)
Glucose, Bld: 74 mg/dL (ref 70–99)
Potassium: 3.9 mmol/L (ref 3.5–5.1)
Sodium: 136 mmol/L (ref 135–145)

## 2020-08-22 LAB — TROPONIN I (HIGH SENSITIVITY)
Troponin I (High Sensitivity): 54 ng/L — ABNORMAL HIGH (ref <18)
Troponin I (High Sensitivity): 57 ng/L — ABNORMAL HIGH (ref <18)

## 2020-08-22 MED ORDER — ASPIRIN 325 MG PO TABS
325.0000 mg | ORAL_TABLET | Freq: Every day | ORAL | Status: DC
  Administered 2020-08-22 – 2020-08-24 (×3): 325 mg via ORAL
  Filled 2020-08-22 (×2): qty 1

## 2020-08-22 MED ORDER — MAGNESIUM SULFATE 2 GM/50ML IV SOLN
2.0000 g | Freq: Once | INTRAVENOUS | Status: AC
  Administered 2020-08-22: 2 g via INTRAVENOUS
  Filled 2020-08-22: qty 50

## 2020-08-22 NOTE — Progress Notes (Signed)
  Responded to rapid response in room 316 -Patient apparently complained of chest discomfort within the last hour or so, when LPN and RN went back into the room patient was unresponsive, attempted to wake him up resulted in slurred speech -Patient of unresponsiveness and slurred speech lasted less than 5 minutes -Rapid response team at bedside -EKG shows Sinus rhythm with 1st degree A-V block, Left anterior fascicular block--- no significant new acute changes from prior EKGs  --- Called--Central telemetry to review patient's telemetry record--- no evidence of any significant arrhythmia patient has been in mostly sinus rhythm - -Magnesium was 1.7 this a.m., patient is receiving diuretics we will give IV mag 2 g x 1 -Troponins, CBC and  renal panel are pending -Case discussed with Dr. Heath Lark--- apparently there is some difficulty with reaching patient's family members due to inaccurate phone numbers in the record -Dr. Manuella Ghazi has attempted to reach them without success -Initially code stroke was called along with rapid response so  -Dr. Sabra Heck from the ED came in  -Patient recovered completely and is back to baseline -This may be a TIA event -CT head without contrast is pending -May need to adjust antiplatelet therapy and increases statin Rx -Further management per Dr. Manuella Ghazi  -Total care time 34 minutes  Roxan Hockey, MD

## 2020-08-22 NOTE — Progress Notes (Signed)
Pt complained of chest pain. Vitals were within normal limits, tele read normal sins with HR of 81. Pt was alert and oriented x 4. MD made aware, EKG and Trop was ordered. When this nurse reentered room with EKG machine approximately two minutes later patient was unresponsive. No response to sternal rub but Pt did have a pulse. Rapid response called, Dr. Denton Brick called code stroke at bedside. EKG was performed, Dr. Sabra Heck performed NIH scale at bedside. Pt returned to baseline and code stroke was canceled. Pt has remained alert and oriented, call bell at bedside. Will continue to monitor.

## 2020-08-22 NOTE — Progress Notes (Signed)
PROGRESS NOTE    Anthony Galvan  [id number] DOB: July 02, 1921 DOA: 08/18/2020 PCP: [facility]   Brief Narrative:   Anthony Galvan a 87 y.o.malewith medical history significant forCAD s/p stent placement,bronchiectasis, interstitial lung disease on home oxygen of 4 LPM via [place], duodenal ulcer, GERD, H. pylori gastritis, internal hemorrhoids, hyperlipidemia, CAD, chronic diastolic heart failure, pulmonary hypertension, history of pulmonary embolismwho presents to the emergency department due to chest pain which started shortly prior to arrival to the ED.Anthony Galvan continues to have volume overload and is diuresing well with lasix.  Assessment & Plan:   Principal Problem:   Chest pain Active Problems:   Hyperlipidemia with target low density lipoprotein (LDL) cholesterol less than 70 mg/dL   GERD   CAD S/P percutaneous coronary angioplasty   CKD (chronic kidney disease) stage 3, GFR 30-59 ml/min (HCC)   Interstitial lung disease ([place])   Pulmonary hypertension (HCC)   Fall at home, initial encounter   Syncope   Macrocytic anemia   Symptomatic anemia   Elevated d-dimer   (HFpEF) heart failure with preserved ejection fraction (HCC)   Acute CHF (HCC)   Chest pain in the setting of known CAD -Continues to have intermittent pain -Continue aspirin, statin, Imdur, and Lopressor -Not an ideal candidate for any further testing -Increased dose of Imdur and continue diuresis today  Acute on chronic diastolic CHF exacerbation-ongoing -Continues to have significant volume overload -Negative 2.5L in last 24 hours -Diuresis as scheduled per cardiology with IV Lasix 40 mgBID ongoing -Increase home Lasix '40mg'$  bid at discharge -Follow daily weights and I's and O's -Repeat 2D echocardiogramwith LVEF 55-60%, RV overload -Wears 2L [place] at home  Syncopal episode/NSVT -Patient does have PVCs, plan to keep K at 4.0 and Mg at 2.0 -Lopressor BID discontinued due to primary AV  block 5/8 -CarotidDopplers without any significant findings  ILDwith associated pulmonary hypertension -No acute issues noted, continue to monitor  CKD IIIa-stable -Continue monitor with diuresis  Macrocytic anemia-stable -Continue monitoring repeat labs  GERD -Continue Protonix  DVT prophylaxis:Lovenox Code Status:Full Family Communication:Tried calling daughter 5/6; told this was wrong number Disposition Plan: Status is: Inpatient  Remains inpatient appropriate because:IV treatments appropriate due to intensity of illness or inability to take PO and Inpatient level of care appropriate due to severity of illness   Dispo: The patient is from:Home Anticipated d/c is [id number] Patient currently is not medically stable to d/c. Difficult to place patient No   Consultants:  Cardiology  Procedures:  See below  Antimicrobials:  None  Skin Assessment:  I have examined the patient's skin and I agree with the wound assessment as performed by the wound care RN as outlined below:  Pressure Injury 08/19/20 Sacrum Right;Left;Mid Stage 1 -  Intact skin with non-blanchable redness of a localized area usually over a bony prominence. Stage 1 non blanchable pressure ulcer (Active)  08/19/20 1940  Location: Sacrum  Location Orientation: Right;Left;Mid  Staging: Stage 1 -  Intact skin with non-blanchable redness of a localized area usually over a bony prominence.  Wound Description (Comments): Stage 1 non blanchable pressure ulcer  Present on Admission: Yes    Subjective: Patient seen and evaluated today with no new acute complaints or concerns.  Anthony Galvan denies any concerns of chest pain, but was noted to have some AV blocks noted overnight.  Objective: Vitals:   08/21/20 2358 08/22/20 0415 08/22/20 0702 08/22/20 0817  BP: 98/64  113/66   Pulse:   70  Resp: 16  18   Temp:   (!) 97.5 F (36.4 C)   TempSrc:       SpO2:   100% 100%  Weight:  65.4 kg    Height:        Intake/Output Summary (Last 24 hours) at 08/22/2020 1140 Last data filed at 08/22/2020 0700 Gross per 24 hour  Intake 120 ml  Output 2700 ml  Net -2580 ml   Filed Weights   08/20/20 0500 08/21/20 0438 08/22/20 0415  Weight: 67.2 kg 67.2 kg 65.4 kg    Examination:  General exam: Appears calm and comfortable  Respiratory system: Clear to auscultation. Respiratory effort normal.  nasal cannula oxygen. Cardiovascular system: S1 & S2 heard, RRR.  Gastrointestinal system: Abdomen is soft Central nervous system: Alert and awake Extremities: No edema Skin: No significant lesions noted Psychiatry: Flat affect.    Data Reviewed: I have personally reviewed following labs and imaging studies  CBC: Recent Labs  Lab 08/18/20 1742 08/19/20 0340 08/19/20 0611 08/20/20 0541  WBC 8.5 TEST WILL BE CREDITED  --  9.3  HGB 10.8* TEST WILL BE CREDITED 11.0* 10.1*  HCT 37.3* TEST WILL BE CREDITED 37.8* 34.1*  MCV 104.8* TEST WILL BE CREDITED  --  101.2*  PLT 126* TEST WILL BE CREDITED  --  123456*   Basic Metabolic Panel: Recent Labs  Lab 08/18/20 1742 08/19/20 0340 08/20/20 0541 08/21/20 0513 08/22/20 0409  NA 142 140 140 139 136  K 4.7 4.1 4.3 3.9 3.9  CL 105 105 103 100 94*  CO2 29 28 32 32 35*  GLUCOSE 114* 96 75 73 74  BUN 25* 25* 25* 27* 27*  CREATININE 1.47* 1.32* 1.31* 1.31* 1.27*  CALCIUM 8.9 8.5* 8.8* 8.6* 8.3*  MG  --  1.7 2.0 1.8 1.7  PHOS  --  3.6  --   --   --    GFR: Estimated Creatinine Clearance: 28.5 mL/min (A) (by C-G formula based on SCr of 1.27 mg/dL (H)). Liver Function Tests: Recent Labs  Lab 08/19/20 0340  AST 36  ALT 24  ALKPHOS 66  BILITOT 1.0  PROT 6.2*  ALBUMIN 3.0*   No results for input(s): LIPASE, AMYLASE in the last 168 hours. No results for input(s): AMMONIA in the last 168 hours. Coagulation Profile: Recent Labs  Lab 08/19/20 0340  INR 1.2   Cardiac Enzymes: No results for  input(s): CKTOTAL, CKMB, CKMBINDEX, TROPONINI in the last 168 hours. BNP (last 3 results) No results for input(s): PROBNP in the last 8760 hours. HbA1C: No results for input(s): HGBA1C in the last 72 hours. CBG: Recent Labs  Lab 08/18/20 1707  GLUCAP 91   Lipid Profile: No results for input(s): CHOL, HDL, LDLCALC, TRIG, CHOLHDL, LDLDIRECT in the last 72 hours. Thyroid Function Tests: No results for input(s): TSH, T4TOTAL, FREET4, T3FREE, THYROIDAB in the last 72 hours. Anemia Panel: No results for input(s): VITAMINB12, FOLATE, FERRITIN, TIBC, IRON, RETICCTPCT in the last 72 hours. Sepsis Labs: Recent Labs  Lab 08/19/20 [id number]  PROCALCITON <0.10    Recent Results (from the past 240 hour(s))  Resp Panel by RT-PCR (Flu A&B, Covid) Nasopharyngeal Swab     Status: None   Collection Time: 08/18/20  8:53 PM   Specimen: Nasopharyngeal Swab; Nasopharyngeal(NP) swabs in vial transport medium  Result Value Ref Range Status   SARS Coronavirus 2 by RT PCR NEGATIVE NEGATIVE Final    Comment: (NOTE) SARS-CoV-2 target nucleic acids are NOT DETECTED.  The SARS-CoV-2  RNA is generally detectable in upper respiratory specimens during the acute phase of infection. The lowest concentration of SARS-CoV-2 viral copies this assay can detect is 138 copies/mL. A negative result does not preclude SARS-Cov-2 infection and should not be used as the sole basis for treatment or other patient management decisions. A negative result may occur with  improper specimen collection/handling, submission of specimen other than nasopharyngeal swab, presence of viral mutation(s) within the areas targeted by this assay, and inadequate number of viral copies(<138 copies/mL). A negative result must be combined with clinical observations, patient history, and epidemiological information. The expected result is Negative.  Fact Sheet for Patients:  EntrepreneurPulse.com.au  Fact Sheet for Healthcare  Providers:  IncredibleEmployment.be  This test is no t yet approved or cleared by the [place] FDA and  has been authorized for detection and/or diagnosis of SARS-CoV-2 by FDA under an Emergency Use Authorization (EUA). This EUA will remain  in effect (meaning this test can be used) for the duration of the COVID-19 declaration under Section 564(b)(1) of the Act, 21 U.S.C.section 360bbb-3(b)(1), unless the authorization is terminated  or revoked sooner.       Influenza A by PCR NEGATIVE NEGATIVE Final   Influenza B by PCR NEGATIVE NEGATIVE Final    Comment: (NOTE) The Xpert Xpress SARS-CoV-2/FLU/RSV plus assay is intended as an aid in the diagnosis of influenza from Nasopharyngeal swab specimens and should not be used as a sole basis for treatment. Nasal washings and aspirates are unacceptable for Xpert Xpress SARS-CoV-2/FLU/RSV testing.  Fact Sheet for Patients: EntrepreneurPulse.com.au  Fact Sheet for Healthcare Providers: IncredibleEmployment.be  This test is not yet approved or cleared by the [place] FDA and has been authorized for detection and/or diagnosis of SARS-CoV-2 by FDA under an Emergency Use Authorization (EUA). This EUA will remain in effect (meaning this test can be used) for the duration of the COVID-19 declaration under Section 564(b)(1) of the Act, 21 U.S.C. section 360bbb-3(b)(1), unless the authorization is terminated or revoked.  Performed at [facility], [address], [place], [place] [address]          Radiology Studies: No results found.      Scheduled Meds: . aspirin EC  81 mg Oral Daily  . atorvastatin  40 mg Oral q1800  . donepezil  10 mg Oral Daily  . enoxaparin (LOVENOX) injection  30 mg Subcutaneous Q24H  . furosemide  40 mg Intravenous Q12H  . guaiFENesin  600 mg Oral BID  . ipratropium-albuterol  3 mL Nebulization BID  . isosorbide mononitrate  90 mg Oral Daily   .  morphine injection  4 mg Intravenous Once  . pantoprazole  40 mg Oral Daily    LOS: 3 days    Time spent: 35 minutes    Jarrah Babich Darleen Crocker, DO [facility]  If 7PM-7AM, please contact night-coverage [email or web address] 08/22/2020, 11:40 AM

## 2020-08-22 NOTE — ED Provider Notes (Signed)
[facility]  Department of Emergency Medicine   Rapid Response CONSULT NOTE  Chief Complaint: Cardiac arrest/unresponsive   Level V Caveat: Mental Status Change  History of present illness: I was contacted by the hospital for a rapid response, I arrived at the patient's bedside room 316 to find the patient to have a decreased level of consciousness.  He was on a nonrebreather and did not seem to be responding very well.  When I looked at the cardiac monitor it appeared that he was in ventricular tachycardia, this lasted for approximately 20 seconds and then resolved back to normal sinus rhythm, he obtained no CPR during this time and was spontaneously opening his eyes from time to time.  ROS: Unable to obtain, Level V caveat  Scheduled Meds: . aspirin EC  81 mg Oral Daily  . atorvastatin  40 mg Oral q1800  . donepezil  10 mg Oral Daily  . enoxaparin (LOVENOX) injection  30 mg Subcutaneous Q24H  . furosemide  40 mg Intravenous Q12H  . guaiFENesin  600 mg Oral BID  . ipratropium-albuterol  3 mL Nebulization BID  . isosorbide mononitrate  90 mg Oral Daily  .  morphine injection  4 mg Intravenous Once  . pantoprazole  40 mg Oral Daily   Continuous Infusions: . magnesium sulfate bolus IVPB     PRN Meds:.nitroGLYCERIN Past Medical History:  Diagnosis Date  . Bronchiectasis ([place]) 06/13/2018  . CAD (coronary artery disease) 08/01/2012   BMS mid LAD; catheterization in July 2018 showed with severe heavily calcified distal circumflex-left PDA stenosis, not favorable for PCI.  Marland Kitchen Congestive heart failure (CHF) ([place])   . Duodenal ulcer May 2011   on EGD  . Essential hypertension   . GERD (gastroesophageal reflux disease)   . Helicobacter pylori gastritis 2012  . Hyperlipidemia   . ILD (interstitial lung disease) ([place])   . MI (myocardial infarction) ([place]) 1976, 1980, [place] [facility] at [facility] and [place] (Dr. Iona Beard)  . Osteoarthritis   . Pulmonary  embolism ([place])   . Pulmonary hypertension ([place])   . S/P colonoscopy 2011   normal, internal hemorrhoids   Past Surgical History:  Procedure Laterality Date  . CARDIAC CATHETERIZATION  01/30/2007   D1 75 %, mid LAD 50%, 50-70% circumflex, 50-70% RCA.(Dr. Adora Fridge)  . CARDIAC CATHETERIZATION  12/11/2001   normal L main, small RCA, dominant LL Cfx with mild diffuse disease, LAD with mid 10-20% stenosis, ramus intermedius with mild diffuse disease (Dr. Jackie Plum)  . CATARACT EXTRACTION    . COLONOSCOPY  OCT 2011 ARS   [facility] [place]   in setting of MI  . ESOPHAGOGASTRODUODENOSCOPY N/A 10/09/2016   Procedure: ESOPHAGOGASTRODUODENOSCOPY (EGD);  Surgeon: Danie Binder, MD;  [facility];  Service: Endoscopy;  Laterality: N/A;  . FEMUR IM NAIL Right 12/01/2016   Procedure: INTRAMEDULLARY (IM) NAIL RIGHT HIP;  Surgeon: Altamese Nemaha, MD;  Location: [facility];  Service: Orthopedics;  Laterality: Right;  . FEMUR IM NAIL Left 05/07/2018   Procedure: INTRAMEDULLARY (IM) NAIL FEMORAL;  Surgeon: Nicholes Stairs, MD;  Location: [facility];  Service: Orthopedics;  Laterality: Left;  . LEFT HEART CATH AND CORONARY ANGIOGRAPHY N/A 10/23/2016   Procedure: Left Heart Cath and Coronary Angiography;  Surgeon: Sherren Mocha, MD;  Location: [facility] CV LAB;  Service: Cardiovascular: Two-vessel CAD (left dominant).  Patent BMS in p LAD.  Severe, heavily calcified dCx-LPDA  lesion.  Not favorable for PCI.  Marland Kitchen LEFT HEART CATHETERIZATION WITH CORONARY ANGIOGRAM N/A 08/01/2012   Procedure: LEFT HEART CATHETERIZATION WITH CORONARY ANGIOGRAM;  Surgeon: Leonie Man, MD;  Location: [facility] CATH LAB: Mid LAD 99% apple core; D2 ostial 70-80% (2 small for PCI) small nondominant RCA 60-70%. Distal circumflex/L PDA ~50% --> PCI of LAD  . NM MYOCAR PERF WALL MOTION  08/2003   adenosine stress - focal decreased perfusion defect in distal inferior wall, no significant ischemic changes  .  PERCUTANEOUS STENT INTERVENTION  08/01/2012   Procedure: PERCUTANEOUS STENT INTERVENTION;  Surgeon: Leonie Man, MD;  Location: [facility] CATH LAB; ;PCI of mid LAD with a VeriFlex Bare Metal Stent 3.0 mm x 12 mm - post-dilated to 3.5 mm.  . TRANSTHORACIC ECHOCARDIOGRAM  10/2016   mild LVH - 60-65%. Gr 1 DD. No RWMA. Mild MR. Mod TR. Mild RA dilation.  PAP ~ 60 mmHg.  Marland Kitchen UPPER GASTROINTESTINAL ENDOSCOPY  08/2009   MELENA, HEMATEMESIS --> DUODENAL ULCER, Bx: H PYLORI POS   Social History   Socioeconomic History  . Marital status: Widowed    Spouse name: Not on file  . Number of children: 3  . Years of education: Not on file  . Highest education level: Not on file  Occupational History  . Not on file  Tobacco Use  . Smoking status: Never Smoker  . Smokeless tobacco: Never Used  Vaping Use  . Vaping Use: Never used  Substance and Sexual Activity  . Alcohol use: No  . Drug use: No  . Sexual activity: Not on file  Other Topics Concern  . Not on file  Social History Narrative   Is a father of 3 with 3 grandchildren 3 great-grandchildren. He lives with one of his 2 grandsons.   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 He was a war 2 veteran in the [place]. 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   He never smoked. He does not drink alcohol.   He is very active, and a majority. He walks 30+ minutes every day. He also likes to play golf.   Social Determinants of Health   Financial Resource Strain: Not on file  Food Insecurity: Not on file  Transportation Needs: Not on file  Physical Activity: Not on file  Stress: Not on file  Social Connections: Not on file  Intimate Partner Violence: Not on file   Allergies  Allergen Reactions  . Lisinopril Other (See Comments)    "Allergic," per the [facility]- patient is unaware of any reaction (??)    Last set of Vital Signs  (not current) Vitals:   08/22/20 0702 08/22/20 0817  BP: 113/66   Pulse: 70   Resp: 18   Temp: (!) 97.5 F (36.4 C)   SpO2: 100% 100%      Physical Exam  Gen: Less responsive Cardiovascular: Pulses are weak at first and then normalized Resp: apneic. Breath sounds equal  Abd: nondistended  Neuro: Initially less responsive but eventually was able to move all 4 extremities follow commands and talk with normal speech HEENT: No blood in posterior pharynx, gag reflex absent  Neck: No crepitus  Musculoskeletal: No deformity  Skin: warm    Medical Decision making  This patient is critically ill, it appears that he had a long run of ventricular tachycardia  but this self resolved.  He did receive no CPR and perfused through the entire event.  At the time I left the bed the hospitalist was at the bedside managing the patient's care, the patient does not need to be intubated at this time, he does not need any central access as he has good peripheral access.  Assessment and Plan  Hospitalist to determine further treatment plan, the patient appears medically stabilized at the time I left the room.    Noemi Chapel, MD 08/22/20 [phone]

## 2020-08-23 ENCOUNTER — Inpatient Hospital Stay (HOSPITAL_COMMUNITY): Payer: No Typology Code available for payment source

## 2020-08-23 DIAGNOSIS — I472 Ventricular tachycardia: Secondary | ICD-10-CM

## 2020-08-23 DIAGNOSIS — I259 Chronic ischemic heart disease, unspecified: Secondary | ICD-10-CM

## 2020-08-23 LAB — BASIC METABOLIC PANEL
Anion gap: 8 (ref 5–15)
BUN: 27 mg/dL — ABNORMAL HIGH (ref 8–23)
CO2: 34 mmol/L — ABNORMAL HIGH (ref 22–32)
Calcium: 8.4 mg/dL — ABNORMAL LOW (ref 8.9–10.3)
Chloride: 95 mmol/L — ABNORMAL LOW (ref 98–111)
Creatinine, Ser: 1.31 mg/dL — ABNORMAL HIGH (ref 0.61–1.24)
GFR, Estimated: 50 mL/min — ABNORMAL LOW (ref >60)
Glucose, Bld: 77 mg/dL (ref 70–99)
Potassium: 3.9 mmol/L (ref 3.5–5.1)
Sodium: 137 mmol/L (ref 135–145)

## 2020-08-23 LAB — MAGNESIUM: Magnesium: 2 mg/dL (ref 1.7–2.4)

## 2020-08-23 LAB — LIPID PANEL
Cholesterol: 102 mg/dL (ref 0–200)
HDL: 61 mg/dL (ref >40)
LDL Cholesterol: 30 mg/dL (ref 0–99)
Total CHOL/HDL Ratio: 1.7 RATIO
Triglycerides: 57 mg/dL (ref <150)
VLDL: 11 mg/dL (ref 0–40)

## 2020-08-23 LAB — TROPONIN I (HIGH SENSITIVITY)
Troponin I (High Sensitivity): 49 ng/L — ABNORMAL HIGH (ref <18)
Troponin I (High Sensitivity): 55 ng/L — ABNORMAL HIGH (ref <18)

## 2020-08-23 LAB — HEMOGLOBIN A1C
Hgb A1c MFr Bld: 4.8 % (ref 4.8–5.6)
Mean Plasma Glucose: 91.06 mg/dL

## 2020-08-23 MED ORDER — BISOPROLOL FUMARATE 5 MG PO TABS
2.5000 mg | ORAL_TABLET | Freq: Every day | ORAL | Status: DC
  Administered 2020-08-23 – 2020-08-24 (×2): 2.5 mg via ORAL
  Filled 2020-08-23 (×2): qty 1

## 2020-08-23 MED ORDER — FUROSEMIDE 40 MG PO TABS
40.0000 mg | ORAL_TABLET | Freq: Every day | ORAL | Status: DC
  Administered 2020-08-24: 40 mg via ORAL
  Filled 2020-08-23: qty 1

## 2020-08-23 NOTE — Progress Notes (Signed)
PROGRESS NOTE    Anthony Galvan  [id number] DOB: [birth year] DOA: 08/18/2020 PCP: [facility]   Brief Narrative:   Anthony Galvan a 87 y.o.malewith medical history significant forCAD s/p stent placement,bronchiectasis, interstitial lung disease on home oxygen of 4 LPM via [place], duodenal ulcer, GERD, H. pylori gastritis, internal hemorrhoids, hyperlipidemia, CAD, chronic diastolic heart failure, pulmonary hypertension, history of pulmonary embolismwho presents to the emergency department due to chest pain which started shortly prior to arrival to the ED.He was admitted for acute on chronic diastolic congestive heart failure exacerbation and required multiple days of IV diuresis.  He was also noted to have recurrent chest pain, but no signs of ACS.  Additionally, he had an episode of TIA on 5/8, but has recovered fully from this.  Assessment & Plan:   Principal Problem:   Chest pain Active Problems:   Hyperlipidemia with target low density lipoprotein (LDL) cholesterol less than 70 mg/dL   GERD   CAD S/P percutaneous coronary angioplasty   CKD (chronic kidney disease) stage 3, GFR 30-59 ml/min (HCC)   Interstitial lung disease ([place])   Pulmonary hypertension (HCC)   Fall at home, initial encounter   Syncope   Macrocytic anemia   Symptomatic anemia   Elevated d-dimer   (HFpEF) heart failure with preserved ejection fraction (HCC)   Acute CHF (HCC)  Chest pain in the setting of known CAD-recurrent -Continues to have intermittent pain -Continue aspirin, statin, Imdur, and Lopressor -Not an ideal candidate for any further testing -Increased dose of Imdur during admission -Repeat EKG and Troponin negative on 5/9   TIA -Altered mentation on 5/8 with head CT demonstrating no acute abnormalities -Started on full dose aspirin -Continue statin -Plan to obtain brain MRI -Hemoglobin A1c and fasting lipid panel pending -PT evaluation pending  Acute on chronic  diastolic CHF exacerbation-resolved -Continues to have significant volume overload -Increase home Lasix '40mg'$  bid at discharge; now transitioned to PO per Cardiology -Follow daily weights and I's and O's -Repeat 2D echocardiogramwith LVEF 55-60%, RV overload -Wears 2L [place] at home  Syncopal episode/NSVT -Patient does have PVCs, plan to keep K at 4.0 and Mg at 2.0 -Lopressor BID discontinued due to primary AV block 5/8 -Now on bisoprolol per Cardiology 5/9 -CarotidDopplers without any significant findings  ILDwith associated pulmonary hypertension -No acute issues noted, continue to monitor  CKD IIIa-stable -Continue monitor with diuresis  Macrocytic anemia-stable -Continue monitoring repeat labs  GERD -Continue Protonix  DVT prophylaxis:Lovenox Code Status:DNR Family Communication:Discussed with daughter on phone 5/9 Disposition Plan: Status is: Inpatient  Remains inpatient appropriate because:IV treatments appropriate due to intensity of illness or inability to take PO and Inpatient level of care appropriate due to severity of illness   Dispo: The patient is from:Home Anticipated d/c is [id number] with home health PT/OT Patient currently is not medically stable to d/c. Difficult to place patient No   Consultants:  Cardiology  Procedures:  See below  Antimicrobials:  None  Skin Assessment:  I have examined the patient's skin and I agree with the wound assessment as performed by the wound care RN as outlined below:  Pressure Injury 08/19/20 Sacrum Right;Left;Mid Stage 1 - Intact skin with non-blanchable redness of a localized area usually over a bony prominence. Stage 1 non blanchable pressure ulcer (Active)  08/19/20 1940  Location: Sacrum  Location Orientation: Right;Left;Mid  Staging: Stage 1 - Intact skin with non-blanchable redness of a localized area usually over a bony prominence.  Wound  Description (Comments): Stage 1 non blanchable pressure ulcer  Present on Admission: Yes    Subjective: Patient seen and evaluated today with no new acute complaints or concerns.  He was noted to have altered mentation in the briefly yesterday with suspicion of TIA.  Further work-up still pending.  Additionally, he was noted to have recurrent chest pain later in the morning.  Objective: Vitals:   08/22/20 2053 08/23/20 0500 08/23/20 0524 08/23/20 0850  BP: 122/69  112/69   Pulse: 81  72   Resp: 18  20   Temp: 98 F (36.7 C)  97.7 F (36.5 C)   TempSrc: Oral  Oral   SpO2: 100%  100% 97%  Weight:  62.7 kg    Height:        Intake/Output Summary (Last 24 hours) at 08/23/2020 1252 Last data filed at 08/23/2020 0900 Gross per 24 hour  Intake 720 ml  Output 1600 ml  Net -880 ml   Filed Weights   08/21/20 0438 08/22/20 0415 08/23/20 0500  Weight: 67.2 kg 65.4 kg 62.7 kg    Examination:  General exam: Appears calm and comfortable  Respiratory system: Clear to auscultation. Respiratory effort normal. On 2L [place] Cardiovascular system: S1 & S2 heard, RRR.  Gastrointestinal system: Abdomen is soft Central nervous system: Alert and awake Extremities: No edema Skin: No significant lesions noted Psychiatry: Flat affect.    Data Reviewed: I have personally reviewed following labs and imaging studies  CBC: Recent Labs  Lab 08/18/20 1742 08/19/20 0340 08/19/20 0611 08/20/20 0541 08/22/20 1646  WBC 8.5 TEST WILL BE CREDITED  --  9.3 11.9*  HGB 10.8* TEST WILL BE CREDITED 11.0* 10.1* 9.9*  HCT 37.3* TEST WILL BE CREDITED 37.8* 34.1* 32.6*  MCV 104.8* TEST WILL BE CREDITED  --  101.2* 97.9  PLT 126* TEST WILL BE CREDITED  --  120* [id number]*   Basic Metabolic Panel: Recent Labs  Lab 08/19/20 0340 08/20/20 0541 08/21/20 0513 08/22/20 0409 08/22/20 1646 08/23/20 0427  NA 140 140 139 136 138 137  K 4.1 4.3 3.9 3.9 4.1 3.9  CL 105 103 100 94* 96* 95*  CO2 28 32 32 35* 34* 34*   GLUCOSE 96 75 73 74 76 77  BUN 25* 25* 27* 27* 27* 27*  CREATININE 1.32* 1.31* 1.31* 1.27* 1.33* 1.31*  CALCIUM 8.5* 8.8* 8.6* 8.3* 8.4* 8.4*  MG 1.7 2.0 1.8 1.7 1.6* 2.0  PHOS 3.6  --   --   --  2.9  --    GFR: Estimated Creatinine Clearance: 27.6 mL/min (A) (by C-G formula based on SCr of 1.31 mg/dL (H)). Liver Function Tests: Recent Labs  Lab 08/19/20 0340 08/22/20 1646  AST 36  --   ALT 24  --   ALKPHOS 66  --   BILITOT 1.0  --   PROT 6.2*  --   ALBUMIN 3.0* 3.2*   No results for input(s): LIPASE, AMYLASE in the last 168 hours. No results for input(s): AMMONIA in the last 168 hours. Coagulation Profile: Recent Labs  Lab 08/19/20 0340  INR 1.2   Cardiac Enzymes: No results for input(s): CKTOTAL, CKMB, CKMBINDEX, TROPONINI in the last 168 hours. BNP (last 3 results) No results for input(s): PROBNP in the last 8760 hours. HbA1C: No results for input(s): HGBA1C in the last 72 hours. CBG: Recent Labs  Lab 08/18/20 1707 08/22/20 1629  GLUCAP 91 112*   Lipid Profile: Recent Labs    08/23/20 0427  CHOL  102  HDL 61  LDLCALC 30  TRIG 57  CHOLHDL 1.7   Thyroid Function Tests: No results for input(s): TSH, T4TOTAL, FREET4, T3FREE, THYROIDAB in the last 72 hours. Anemia Panel: No results for input(s): VITAMINB12, FOLATE, FERRITIN, TIBC, IRON, RETICCTPCT in the last 72 hours. Sepsis Labs: Recent Labs  Lab 08/19/20 [id number]  PROCALCITON <0.10    Recent Results (from the past 240 hour(s))  Resp Panel by RT-PCR (Flu A&B, Covid) Nasopharyngeal Swab     Status: None   Collection Time: 08/18/20  8:53 PM   Specimen: Nasopharyngeal Swab; Nasopharyngeal(NP) swabs in vial transport medium  Result Value Ref Range Status   SARS Coronavirus 2 by RT PCR NEGATIVE NEGATIVE Final    Comment: (NOTE) SARS-CoV-2 target nucleic acids are NOT DETECTED.  The SARS-CoV-2 RNA is generally detectable in upper respiratory specimens during the acute phase of infection. The  lowest concentration of SARS-CoV-2 viral copies this assay can detect is 138 copies/mL. A negative result does not preclude SARS-Cov-2 infection and should not be used as the sole basis for treatment or other patient management decisions. A negative result may occur with  improper specimen collection/handling, submission of specimen other than nasopharyngeal swab, presence of viral mutation(s) within the areas targeted by this assay, and inadequate number of viral copies(<138 copies/mL). A negative result must be combined with clinical observations, patient history, and epidemiological information. The expected result is Negative.  Fact Sheet for Patients:  EntrepreneurPulse.com.au  Fact Sheet for Healthcare Providers:  IncredibleEmployment.be  This test is no t yet approved or cleared by the [place] FDA and  has been authorized for detection and/or diagnosis of SARS-CoV-2 by FDA under an Emergency Use Authorization (EUA). This EUA will remain  in effect (meaning this test can be used) for the duration of the COVID-19 declaration under Section 564(b)(1) of the Act, 21 U.S.C.section 360bbb-3(b)(1), unless the authorization is terminated  or revoked sooner.       Influenza A by PCR NEGATIVE NEGATIVE Final   Influenza B by PCR NEGATIVE NEGATIVE Final    Comment: (NOTE) The Xpert Xpress SARS-CoV-2/FLU/RSV plus assay is intended as an aid in the diagnosis of influenza from Nasopharyngeal swab specimens and should not be used as a sole basis for treatment. Nasal washings and aspirates are unacceptable for Xpert Xpress SARS-CoV-2/FLU/RSV testing.  Fact Sheet for Patients: EntrepreneurPulse.com.au  Fact Sheet for Healthcare Providers: IncredibleEmployment.be  This test is not yet approved or cleared by the [place] FDA and has been authorized for detection and/or diagnosis of SARS-CoV-2 by FDA under  an Emergency Use Authorization (EUA). This EUA will remain in effect (meaning this test can be used) for the duration of the COVID-19 declaration under Section 564(b)(1) of the Act, 21 U.S.C. section 360bbb-3(b)(1), unless the authorization is terminated or revoked.  Performed at [facility], [address] [place],  [address]          Radiology Studies: CT HEAD WO CONTRAST  Result Date: 08/22/2020 CLINICAL DATA:  Acute neurologic deficit, unresponsive. EXAM: CT HEAD WITHOUT CONTRAST TECHNIQUE: Contiguous axial images were obtained from the base of the skull through the vertex without intravenous contrast. COMPARISON:  CT head dated 08/18/2020. FINDINGS: Brain: No evidence of acute infarction, hemorrhage, hydrocephalus, extra-axial collection or mass lesion/mass effect. There is moderate cerebral volume loss with associated ex vacuo dilatation. Periventricular white matter hypoattenuation likely represents chronic small vessel ischemic disease. Vascular: There are vascular calcifications in the carotid siphons. Skull: Normal. Negative for fracture  or focal lesion. Sinuses/Orbits: There is a left ethmoid sinus disease. Other: None. IMPRESSION: No acute intracranial process. Electronically Signed   By: Zerita Boers M.D.   On: 08/22/2020 17:52        Scheduled Meds: . aspirin  325 mg Oral Daily  . atorvastatin  40 mg Oral q1800  . bisoprolol  2.5 mg Oral Daily  . donepezil  10 mg Oral Daily  . enoxaparin (LOVENOX) injection  30 mg Subcutaneous Q24H  . furosemide  40 mg Oral Daily  . guaiFENesin  600 mg Oral BID  . ipratropium-albuterol  3 mL Nebulization BID  . isosorbide mononitrate  90 mg Oral Daily  .  morphine injection  4 mg Intravenous Once  . pantoprazole  40 mg Oral Daily    LOS: 4 days    Time spent: 35 minutes    Paraskevi Funez Darleen Crocker, DO [facility]  If 7PM-7AM, please contact night-coverage [email or web address] 08/23/2020, 12:52 PM

## 2020-08-23 NOTE — Progress Notes (Addendum)
Progress Note  Patient Name: Anthony Galvan Date of Encounter: 08/23/2020  [facility] Cardiologist: Rozann Lesches, MD   Subjective   Yesterday patient had chest pain radiating down left arm and reportedly passed out. Rapid response called and he was in VT~ 20 seconds and converted NSR spontaneously without CPR. Central telemetry reviewed tele and said no significant arrhythmia and mostly NSR. No documentation of VT and work up for possible TIA/CVA per primary team.  Inpatient Medications    Scheduled Meds: . aspirin  325 mg Oral Daily  . atorvastatin  40 mg Oral q1800  . bisoprolol  2.5 mg Oral Daily  . donepezil  10 mg Oral Daily  . enoxaparin (LOVENOX) injection  30 mg Subcutaneous Q24H  . furosemide  40 mg Intravenous Q12H  . guaiFENesin  600 mg Oral BID  . ipratropium-albuterol  3 mL Nebulization BID  . isosorbide mononitrate  90 mg Oral Daily  .  morphine injection  4 mg Intravenous Once  . pantoprazole  40 mg Oral Daily    PRN Meds: nitroGLYCERIN   Vital Signs    Vitals:   08/22/20 2053 08/23/20 0500 08/23/20 0524 08/23/20 0850  BP: 122/69  112/69   Pulse: 81  72   Resp: 18  20   Temp: 98 F (36.7 C)  97.7 F (36.5 C)   TempSrc: Oral  Oral   SpO2: 100%  100% 97%  Weight:  62.7 kg    Height:        Intake/Output Summary (Last 24 hours) at 08/23/2020 0940 Last data filed at 08/23/2020 0900 Gross per 24 hour  Intake 720 ml  Output 1600 ml  Net -880 ml   Last 3 Weights 08/23/2020 08/22/2020 08/21/2020  Weight (lbs) 138 lb 3.7 oz 144 lb 2.9 oz 148 lb 2.4 oz  Weight (kg) 62.7 kg 65.4 kg 67.2 kg      Telemetry    NSR with PVC - Personally Reviewed  ECG    NSR with first degree AV block LAFB no acute change - Personally Reviewed  Physical Exam    GEN: No acute distress.   Neck: Increase JVD Cardiac: RRR, 2/6 systolic murmur LSB Respiratory: decreased breath sounds with scattered wheezing/rhoncki GI: Soft, nontender, non-distended  MS: No edema; No  deformity. Neuro:  Nonfocal  Psych: Normal affect   Labs    High Sensitivity Troponin:   Recent Labs  Lab 08/18/20 1742 08/18/20 1914 08/22/20 1646 08/22/20 1845  TROPONINIHS 41* 62* 54* 57*      Chemistry Recent Labs  Lab 08/19/20 0340 08/20/20 0541 08/22/20 0409 08/22/20 1646 08/23/20 0427  NA 140   < > 136 138 137  K 4.1   < > 3.9 4.1 3.9  CL 105   < > 94* 96* 95*  CO2 28   < > 35* 34* 34*  GLUCOSE 96   < > 74 76 77  BUN 25*   < > 27* 27* 27*  CREATININE 1.32*   < > 1.27* 1.33* 1.31*  CALCIUM 8.5*   < > 8.3* 8.4* 8.4*  PROT 6.2*  --   --   --   --   ALBUMIN 3.0*  --   --  3.2*  --   AST 36  --   --   --   --   ALT 24  --   --   --   --   ALKPHOS 66  --   --   --   --  BILITOT 1.0  --   --   --   --   GFRNONAA 49*   < > 52* 49* 50*  ANIONGAP 7   < > '7 8 8   '$ < > = values in this interval not displayed.     Hematology Recent Labs  Lab 08/19/20 0340 08/19/20 0611 08/20/20 0541 08/22/20 1646  WBC TEST WILL BE CREDITED  --  9.3 11.9*  RBC TEST WILL BE CREDITED  --  3.37* 3.33*  HGB TEST WILL BE CREDITED 11.0* 10.1* 9.9*  HCT TEST WILL BE CREDITED 37.8* 34.1* 32.6*  MCV TEST WILL BE CREDITED  --  101.2* 97.9  MCH TEST WILL BE CREDITED  --  30.0 29.7  MCHC TEST WILL BE CREDITED  --  29.6* 30.4  RDW TEST WILL BE CREDITED  --  13.2 13.1  PLT TEST WILL BE CREDITED  --  120* 127*    BNP Recent Labs  Lab 08/19/20 0340  BNP 962.0*     DDimer  Recent Labs  Lab 08/18/20 1742  DDIMER 3.61*     Radiology    CT HEAD WO CONTRAST  Result Date: 08/22/2020 CLINICAL DATA:  Acute neurologic deficit, unresponsive. EXAM: CT HEAD WITHOUT CONTRAST TECHNIQUE: Contiguous axial images were obtained from the base of the skull through the vertex without intravenous contrast. COMPARISON:  CT head dated 08/18/2020. FINDINGS: Brain: No evidence of acute infarction, hemorrhage, hydrocephalus, extra-axial collection or mass lesion/mass effect. There is moderate cerebral  volume loss with associated ex vacuo dilatation. Periventricular white matter hypoattenuation likely represents chronic small vessel ischemic disease. Vascular: There are vascular calcifications in the carotid siphons. Skull: Normal. Negative for fracture or focal lesion. Sinuses/Orbits: There is a left ethmoid sinus disease. Other: None. IMPRESSION: No acute intracranial process. Electronically Signed   By: Zerita Boers M.D.   On: 08/22/2020 17:52    Cardiac Studies   Limited echo 08/19/20  IMPRESSIONS    1. Limited study.   2. Left ventricular ejection fraction, by estimation, is 55 to 60%. The  left ventricle has normal function. The left ventricle has no regional  wall motion abnormalities. There is severe concentric left ventricular  hypertrophy. There is the  interventricular septum is flattened in systole and diastole, consistent  with right ventricular pressure and volume overload.   3. Right ventricular systolic function is low normal. The right  ventricular size is mildly enlarged. There is moderately elevated  pulmonary artery systolic pressure. The estimated right ventricular  systolic pressure is 123XX123 mmHg.   4. Right atrial size was moderately dilated.   5. A small pericardial effusion is present. The pericardial effusion is  posterior to the left ventricle.   6. The mitral valve is grossly normal. Trivial mitral valve  regurgitation.   7. The aortic valve is tricuspid. There is moderate calcification of the  aortic valve. Aortic valve regurgitation is mild. Aortic regurgitation PHT  measures 454 msec.   8. The inferior vena cava is dilated in size with <50% respiratory  variability, suggesting right atrial pressure of 15 mmHg.   Patient Profile     87 y.o. male  with past medical history of CAD (s/p BMS to LAD in 2014, repeat cath in 10/2016 showing patent stent and 80% distal LCx stenosis not suitable for PCI and medical management recommended), ILD (on 3L [place] at  baseline), chronic diastolic CHF, pulmonary HTN, moderate to severe TR, acute PE (diagnosed in 05/2018 - no longer on anticoagulation), HTN,  HLD, GERD and Stage 3 CKD who is being seen today for the evaluation of chest pain at the request of Dr. Josephine Cables.   Assessment & Plan     1. CAD/Chest pain - - He is s/p BMS to LAD in 2014 with repeat cath in 10/2016 showing patent stent and 80% distal LCx stenosis not suitable for PCI and medical management was recommended - mild trop up to 62, EKG without specific ischemic changes - Echo LVEF 55-60%, no WMAs, RV pressure/volume overload.    - chest pain with mild trop elevation/nonsichemic EKG in patient with known ostial LCX disease not amenable PCi also presenting with fluid overload, 87 yo with multiple medical comorbidities including ILD on home O2 and some CKD. No plans for ischemic testing - continue medical therapy with ASA 81, atorva 40, lopressor 12.'5mg'$  bid, imdur 60. No ACE/ARB due to renal dysfunction   - Yest recurrent chest down left arm/unresponsive Rapid response team called did sternal rub ?VT vs artifact-central tele called and said no VT and workup for stroke-CT negative.EKG unchanged. On Imdur 90 mg daily(increased last week).Mg 1.6 replaced and 2.0 today. Troponin yest 54 and  57. Would have primary team discuss code status.   2. Acute on chronic diastolic HF - BNP [id number], CT small bilateral effusions - on  IV lasix '40mg'$  daily. I/Os negative 4.8 L Cr 1.33 with diuresis consistent with venous congestion and CHF   -still some volume overloaded, dose IV lasix '40mg'$  bid a - would increase home diuretic to '40mg'$  bid at discharge.    3. ILD - on home O2 3-4L   4. NSVT - 20 beat run this admission per Dr. Nelly Laurence note last week but can't see on telemetry.  off his lopressor at the time. Chest pain with ? 20 beats VT yest-assume ischemia. Metoprolol held since here ? because of some low BP.Would start low dose beta blocker bisoprolol 2.5  mg  once daily and discuss code status with patient and family.     5. Fall - unclear history, at this time not clear if any related syncope -  No further cardiac workup at this time    For questions or updates, please contact [place] Please consult [email or web address] for contact info under     Signed, Ermalinda Barrios, PA-C 08/23/2020, 9:40 AM      Attending note:  Patient seen and examined.  Chart reviewed and case discussed with Ms. Bonnell Public PA-C as well as Dr. Manuella Ghazi.  Episode of chest discomfort and apparent lack of responsiveness yesterday, rapid response team involved with reported episode of VT, however not documented by telemetry including review this morning.  He is being managed for ischemic heart disease with medical therapy adjustments, also component of acute on chronic diastolic heart failure.  On examination this morning he appears comfortable, no chest pain or dizziness.  He is afebrile, heart rate in the 70s in sinus rhythm by telemetry, prolonged PR interval.  Recent systolics ranging from [id number].  Lungs with decreased breath sounds but no wheezing.  Cardiac exam with RRR and 2/6 systolic murmur.  Pertinent lab work includes potassium 3.9, BUN 27, creatinine 1.31, high-sensitivity troponin I levels 54 and 57, HDL 30, hemoglobin 9.9, platelets 127.  Further neuroimaging planned by primary team, brain MRI pending.  Would plan to continue medical therapy for management of ischemic heart disease and diastolic heart failure.  Would also have further goals of care discussion with patient, recommended DNR status.  Continue aspirin, Lipitor,  Imdur, start bisoprolol 2.5 mg daily.  Would change from IV Lasix to oral dosing.  If further VT documented, could consider amiodarone.  Satira Sark, M.D., F.A.C.C.

## 2020-08-23 NOTE — Progress Notes (Signed)
Physical Therapy Treatment Patient Details Name: Anthony Galvan MRN: [id number] DOB: 1922/03/22 Today's Date: 08/23/2020    History of Present Illness HPI: Anthony Galvan is a 87 y.o. male with medical history significant for CAD s/p stent placement, bronchiectasis, interstitial lung disease on home oxygen of 4 LPM via [place], duodenal ulcer, GERD, H. pylori gastritis, internal hemorrhoids, hyperlipidemia, CAD, chronic diastolic heart failure, pulmonary hypertension, history of pulmonary embolism who presents to the emergency department due to chest pain which started shortly prior to arrival to the ED.  Chest pain was midsternal to left-sided chest area and it was described as pressure like.  He states that he was getting into the car to go to doctor's appointment at [facility] and as he was pulling on the handle to get into the car, he ended up falling and passed out, he states that the next thing he remembered was being taken to the hospital.  Part of the history was obtained from ED physician and ED medical record.  Per report, patient was initially confused and complaining of some shoulder pain and also appeared diaphoretic.  EMS was activated and he was taken to the ED for further evaluation and management.  At baseline, patient ambulates with a wheelchair, a requires help from daughter for bathing and changing his diaper.    PT Comments    Patient demonstrates slow labored movement for sitting up at bedside due to generalized weakness, very unsteady on feet and limited to a few side steps before having to sit due to BLE weakness and poor standing balance.  Patient demonstrates fair/good return for completing BLE ROM/strengthening exercises while seated at EOB requiring occasional verbal cues.  Patient tolerated sitting up in chair after therapy - RN notified.  Patient will benefit from continued physical therapy in hospital and recommended venue below to increase strength, balance, endurance for safe ADLs and  gait.     Follow Up Recommendations  Home health PT;Supervision for mobility/OOB;Supervision - Intermittent     Equipment Recommendations  None recommended by PT    Recommendations for Other Services       Precautions / Restrictions Precautions Precautions: Fall Restrictions Weight Bearing Restrictions: No    Mobility  Bed Mobility Overal bed mobility: Needs Assistance Bed Mobility: Supine to Sit     Supine to sit: Min assist;Mod assist     General bed mobility comments: increased time, labored movement    Transfers Overall transfer level: Needs assistance   Transfers: Sit to/from Stand;Stand Pivot Transfers   Stand pivot transfers: Mod assist       General transfer comment: unsteady on feet, had difficulty taking side steps due to weakness  Ambulation/Gait Ambulation/Gait assistance: Mod assist;Max assist Gait Distance (Feet): 3 Feet Assistive device: Rolling walker (2 wheeled) Gait Pattern/deviations: Decreased step length - right;Decreased step length - left;Decreased stride length Gait velocity: slow   General Gait Details: limited to 3-4 slow labored unsteady side steps before having to sit due to fatigue   Stairs             Wheelchair Mobility    Modified Rankin (Stroke Patients Only)       Balance Overall balance assessment: Needs assistance Sitting-balance support: Feet supported;No upper extremity supported Sitting balance-Leahy Scale: Good Sitting balance - Comments: EOB   Standing balance support: During functional activity;Bilateral upper extremity supported Standing balance-Leahy Scale: Poor Standing balance comment: fair/poor using RW  Cognition Arousal/Alertness: Awake/alert Behavior During Therapy: WFL for tasks assessed/performed Overall Cognitive Status: Within Functional Limits for tasks assessed                                        Exercises General  Exercises - Lower Extremity Long Arc Quad: Seated;AROM;Strengthening;Both;10 reps Hip Flexion/Marching: Seated;AROM;Strengthening;Both;10 reps Toe Raises: Seated;AROM;Strengthening;Both;15 reps Heel Raises: Seated;AROM;Strengthening;Both;15 reps    General Comments        Pertinent Vitals/Pain Pain Assessment: No/denies pain    Home Living                      Prior Function            PT Goals (current goals can now be found in the care plan section) Acute Rehab PT Goals Patient Stated Goal: return home with family to assist PT Goal Formulation: With patient Time For Goal Achievement: 08/23/20 Potential to Achieve Goals: Good Progress towards PT goals: Progressing toward goals    Frequency    Min 3X/week      PT Plan Current plan remains appropriate    Co-evaluation              AM-PAC PT "6 Clicks" Mobility   Outcome Measure  Help needed turning from your back to your side while in a flat bed without using bedrails?: A Little Help needed moving from lying on your back to sitting on the side of a flat bed without using bedrails?: A Lot Help needed moving to and from a bed to a chair (including a wheelchair)?: A Lot Help needed standing up from a chair using your arms (e.g., wheelchair or bedside chair)?: A Lot Help needed to walk in hospital room?: A Lot Help needed climbing 3-5 steps with a railing? : Total 6 Click Score: 12    End of Session Equipment Utilized During Treatment: Oxygen Activity Tolerance: Patient tolerated treatment well;Patient limited by fatigue Patient left: in chair;with call bell/phone within reach Nurse Communication: Mobility status PT Visit Diagnosis: Unsteadiness on feet (R26.81);Other abnormalities of gait and mobility (R26.89);Muscle weakness (generalized) (M62.81)     Time: [id number] PT Time Calculation (min) (ACUTE ONLY): 29 min  Charges:  $Therapeutic Exercise: 8-22 mins $Therapeutic Activity: 8-22  mins                     4:10 PM, 08/23/20 Lonell Grandchild, MPT Physical Therapist with [facility] [phone] office [phone] mobile phone

## 2020-08-24 ENCOUNTER — Other Ambulatory Visit: Payer: Self-pay

## 2020-08-24 ENCOUNTER — Encounter (HOSPITAL_COMMUNITY): Payer: Self-pay | Admitting: Emergency Medicine

## 2020-08-24 ENCOUNTER — Emergency Department (HOSPITAL_COMMUNITY): Payer: Medicare Other

## 2020-08-24 ENCOUNTER — Emergency Department (HOSPITAL_COMMUNITY)
Admission: EM | Admit: 2020-08-24 | Discharge: 2020-08-25 | Disposition: A | Discharge status: Home / Self Care | Payer: Medicare Other | Attending: Emergency Medicine | Admitting: Emergency Medicine

## 2020-08-24 DIAGNOSIS — I2510 Atherosclerotic heart disease of native coronary artery without angina pectoris: Secondary | ICD-10-CM | POA: Diagnosis not present

## 2020-08-24 DIAGNOSIS — I5033 Acute on chronic diastolic (congestive) heart failure: Secondary | ICD-10-CM | POA: Diagnosis not present

## 2020-08-24 DIAGNOSIS — L8990 Pressure ulcer of unspecified site, unspecified stage: Secondary | ICD-10-CM | POA: Insufficient documentation

## 2020-08-24 DIAGNOSIS — N183 Chronic kidney disease, stage 3 unspecified: Secondary | ICD-10-CM | POA: Diagnosis not present

## 2020-08-24 DIAGNOSIS — I130 Hypertensive heart and chronic kidney disease with heart failure and stage 1 through stage 4 chronic kidney disease, or unspecified chronic kidney disease: Secondary | ICD-10-CM | POA: Insufficient documentation

## 2020-08-24 DIAGNOSIS — Z955 Presence of coronary angioplasty implant and graft: Secondary | ICD-10-CM | POA: Insufficient documentation

## 2020-08-24 DIAGNOSIS — R21 Rash and other nonspecific skin eruption: Secondary | ICD-10-CM | POA: Diagnosis not present

## 2020-08-24 DIAGNOSIS — R040 Epistaxis: Secondary | ICD-10-CM | POA: Insufficient documentation

## 2020-08-24 DIAGNOSIS — I517 Cardiomegaly: Secondary | ICD-10-CM | POA: Diagnosis not present

## 2020-08-24 DIAGNOSIS — Z7982 Long term (current) use of aspirin: Secondary | ICD-10-CM | POA: Insufficient documentation

## 2020-08-24 LAB — BASIC METABOLIC PANEL
Anion gap: 9 (ref 5–15)
BUN: 32 mg/dL — ABNORMAL HIGH (ref 8–23)
CO2: 33 mmol/L — ABNORMAL HIGH (ref 22–32)
Calcium: 8.5 mg/dL — ABNORMAL LOW (ref 8.9–10.3)
Chloride: 93 mmol/L — ABNORMAL LOW (ref 98–111)
Creatinine, Ser: 1.41 mg/dL — ABNORMAL HIGH (ref 0.61–1.24)
GFR, Estimated: 46 mL/min — ABNORMAL LOW (ref >60)
Glucose, Bld: 75 mg/dL (ref 70–99)
Potassium: 4 mmol/L (ref 3.5–5.1)
Sodium: 135 mmol/L (ref 135–145)

## 2020-08-24 LAB — MAGNESIUM: Magnesium: 1.9 mg/dL (ref 1.7–2.4)

## 2020-08-24 MED ORDER — CLOPIDOGREL BISULFATE 75 MG PO TABS: 75.0000 mg | ORAL_TABLET | Freq: Every day | ORAL | 0 refills | Status: DC

## 2020-08-24 MED ORDER — FUROSEMIDE 40 MG PO TABS: 40.0000 mg | ORAL_TABLET | Freq: Every day | ORAL | 2 refills | Status: DC

## 2020-08-24 MED ORDER — COVID-19 MRNA VACC (MODERNA) 50 MCG/0.25ML IM SUSP
0.2500 mL | Freq: Once | INTRAMUSCULAR | Status: AC
  Administered 2020-08-24: 0.25 mL via INTRAMUSCULAR
  Filled 2020-08-24: qty 0.25

## 2020-08-24 MED ORDER — ISOSORBIDE MONONITRATE ER 30 MG PO TB24
90.0000 mg | ORAL_TABLET | Freq: Every day | ORAL | 3 refills | Status: DC
Start: 2020-08-24 — End: 2020-10-14

## 2020-08-24 MED ORDER — BISOPROLOL FUMARATE 5 MG PO TABS: 2.5000 mg | ORAL_TABLET | Freq: Every day | ORAL | 3 refills | Status: DC

## 2020-08-24 NOTE — Discharge Instructions (Addendum)

## 2020-08-24 NOTE — Progress Notes (Signed)
Progress Note  Patient Name: Anthony Galvan Date of Encounter: 08/24/2020  [facility] Cardiologist: Rozann Lesches, MD    Subjective   Feels good today. Hoping to go home. No chest pain today.  Inpatient Medications    Scheduled Meds: . aspirin  325 mg Oral Daily  . atorvastatin  40 mg Oral q1800  . bisoprolol  2.5 mg Oral Daily  . donepezil  10 mg Oral Daily  . enoxaparin (LOVENOX) injection  30 mg Subcutaneous Q24H  . furosemide  40 mg Oral Daily  . guaiFENesin  600 mg Oral BID  . ipratropium-albuterol  3 mL Nebulization BID  . isosorbide mononitrate  90 mg Oral Daily  .  morphine injection  4 mg Intravenous Once  . pantoprazole  40 mg Oral Daily   Continuous Infusions:  PRN Meds:    Vital Signs    Vitals:   08/23/20 2053 08/24/20 0500 08/24/20 0534 08/24/20 0728  BP: (!) 97/54  112/66   Pulse: 67  64   Resp: 18  16   Temp: 98.1 F (36.7 C)  97.8 F (36.6 C)   TempSrc: Oral     SpO2: 99%  99% 97%  Weight:  65.7 kg    Height:        Intake/Output Summary (Last 24 hours) at 08/24/2020 0800 Last data filed at 08/24/2020 0500 Gross per 24 hour  Intake 720 ml  Output 1950 ml  Net -1230 ml   Last 3 Weights 08/24/2020 08/23/2020 08/22/2020  Weight (lbs) 144 lb 13.5 oz 138 lb 3.7 oz 144 lb 2.9 oz  Weight (kg) 65.7 kg 62.7 kg 65.4 kg      Telemetry    NSR - Personally Reviewed  ECG       Physical Exam    GEN: No acute distress.   Neck: No JVD Cardiac: RRR, 2/6 systolic murmur LSB Respiratory: decreased breath sounds with fine crackles. GI: Soft, nontender, non-distended  MS: No edema; No deformity. Neuro:  Nonfocal  Psych: Normal affect   Labs    High Sensitivity Troponin:   Recent Labs  Lab 08/18/20 1914 08/22/20 1646 08/22/20 1845 08/23/20 1040 08/23/20 1226  TROPONINIHS 62* 54* 57* 49* 55*      Chemistry Recent Labs  Lab 08/19/20 0340 08/20/20 0541 08/22/20 1646 08/23/20 0427 08/24/20 0615  NA 140   < > 138 137 135  K  4.1   < > 4.1 3.9 4.0  CL 105   < > 96* 95* 93*  CO2 28   < > 34* 34* 33*  GLUCOSE 96   < > 76 77 75  BUN 25*   < > 27* 27* 32*  CREATININE 1.32*   < > 1.33* 1.31* 1.41*  CALCIUM 8.5*   < > 8.4* 8.4* 8.5*  PROT 6.2*  --   --   --   --   ALBUMIN 3.0*  --  3.2*  --   --   AST 36  --   --   --   --   ALT 24  --   --   --   --   ALKPHOS 66  --   --   --   --   BILITOT 1.0  --   --   --   --   GFRNONAA 49*   < > 49* 50* 46*  ANIONGAP 7   < > '8 8 9   '$ < > = values in this interval not  displayed.     Hematology Recent Labs  Lab 08/19/20 0340 08/19/20 0611 08/20/20 0541 08/22/20 1646  WBC TEST WILL BE CREDITED  --  9.3 11.9*  RBC TEST WILL BE CREDITED  --  3.37* 3.33*  HGB TEST WILL BE CREDITED 11.0* 10.1* 9.9*  HCT TEST WILL BE CREDITED 37.8* 34.1* 32.6*  MCV TEST WILL BE CREDITED  --  101.2* 97.9  MCH TEST WILL BE CREDITED  --  30.0 29.7  MCHC TEST WILL BE CREDITED  --  29.6* 30.4  RDW TEST WILL BE CREDITED  --  13.2 13.1  PLT TEST WILL BE CREDITED  --  120* 127*    BNP Recent Labs  Lab 08/19/20 0340  BNP 962.0*     DDimer  Recent Labs  Lab 08/18/20 1742  DDIMER 3.61*     Radiology    CT HEAD WO CONTRAST  Result Date: 08/22/2020 CLINICAL DATA:  Acute neurologic deficit, unresponsive. EXAM: CT HEAD WITHOUT CONTRAST TECHNIQUE: Contiguous axial images were obtained from the base of the skull through the vertex without intravenous contrast. COMPARISON:  CT head dated 08/18/2020. FINDINGS: Brain: No evidence of acute infarction, hemorrhage, hydrocephalus, extra-axial collection or mass lesion/mass effect. There is moderate cerebral volume loss with associated ex vacuo dilatation. Periventricular white matter hypoattenuation likely represents chronic small vessel ischemic disease. Vascular: There are vascular calcifications in the carotid siphons. Skull: Normal. Negative for fracture or focal lesion. Sinuses/Orbits: There is a left ethmoid sinus disease. Other: None.  IMPRESSION: No acute intracranial process. Electronically Signed   By: Zerita Boers M.D.   On: 08/22/2020 17:52   MR BRAIN WO CONTRAST  Result Date: 08/23/2020 CLINICAL DATA:  TIA.  Weakness and confusion for 1 week. EXAM: MRI HEAD WITHOUT CONTRAST TECHNIQUE: Multiplanar, multiecho pulse sequences of the brain and surrounding structures were obtained without intravenous contrast. COMPARISON:  Head CT 08/22/2020 FINDINGS: Brain: There is no evidence of an acute infarct, intracranial hemorrhage, mass, midline shift, or extra-axial fluid collection. Patchy T2 hyperintensities in the cerebral white matter bilaterally are nonspecific but compatible with moderate chronic small vessel ischemic disease. Chronic lacunar infarcts are noted in the cerebellum and left thalamus. Mildly prominent dilated perivascular spaces are present in the cerebral white matter and basal ganglia bilaterally. There is mild-to-moderate generalized cerebral atrophy. Vascular: Major intracranial vascular flow voids are preserved. Skull and upper cervical spine: Unremarkable bone marrow signal. Sinuses/Orbits: Bilateral cataract extraction. Trace right and small left mastoid effusions. Clear paranasal sinuses. Other: None. IMPRESSION: 1. No acute intracranial abnormality. 2. Moderate chronic small vessel ischemic disease. Electronically Signed   By: Logan Bores M.D.   On: 08/23/2020 13:09    Cardiac Studies   Limited echo 08/19/20   IMPRESSIONS    1. Limited study.   2. Left ventricular ejection fraction, by estimation, is 55 to 60%. The  left ventricle has normal function. The left ventricle has no regional  wall motion abnormalities. There is severe concentric left ventricular  hypertrophy. There is the  interventricular septum is flattened in systole and diastole, consistent  with right ventricular pressure and volume overload.   3. Right ventricular systolic function is low normal. The right  ventricular size is mildly  enlarged. There is moderately elevated  pulmonary artery systolic pressure. The estimated right ventricular  systolic pressure is 123XX123 mmHg.   4. Right atrial size was moderately dilated.   5. A small pericardial effusion is present. The pericardial effusion is  posterior to the left ventricle.  6. The mitral valve is grossly normal. Trivial mitral valve  regurgitation.   7. The aortic valve is tricuspid. There is moderate calcification of the  aortic valve. Aortic valve regurgitation is mild. Aortic regurgitation PHT  measures 454 msec.   8. The inferior vena cava is dilated in size with <50% respiratory  variability, suggesting right atrial pressure of 15 mmHg.    Patient Profile     87 y.o. male  with past medical history of CAD (s/p BMS to LAD in 2014, repeat cath in 10/2016 showing patent stent and 80% distal LCx stenosis not suitable for PCI and medical management recommended), ILD (on 3L [place] at baseline), chronic diastolic CHF, pulmonary HTN, moderate to severe TR, acute PE (diagnosed in 05/2018 - no longer on anticoagulation), HTN, HLD, GERD and Stage 3 CKD who is being seen today for the evaluation of chest pain at the request of Dr. Josephine Cables.    Assessment & Plan    . CAD/Chest pain - - He is s/p BMS to LAD in 2014 with repeat cath in 10/2016 showing patent stent and 80% distal LCx stenosis not suitable for PCI and medical management was recommended - mild trop up to 62, EKG without specific ischemic changes - Echo LVEF 55-60%, no WMAs, RV pressure/volume overload.    - chest pain with mild trop elevation/nonsichemic EKG in patient with known ostial LCX disease not amenable PCi also presenting with fluid overload, 87 yo with multiple medical comorbidities including ILD on home O2 and some CKD. No plans for ischemic testing - continue medical therapy with ASA 81, atorva 40, lopressor 12.'5mg'$  bid, imdur 60. No ACE/ARB due to renal dysfunction   - 08/22/20 recurrent chest down left  arm/unresponsive Rapid response team called did sternal rub ?VT vs artifact-central tele called and said no VT and workup for stroke-CT negative.EKG unchanged. On Imdur 90 mg daily(increased last week).Mg 1.6 replaced and 2.0 today. Troponin yest 54 and  57. Now DNR. No further arryhthmia and tolerating low dose bisoprolol. Would not increase with low BP. .   2. Acute on chronic diastolic HF - BNP [id number], CT small bilateral effusions - on  IV lasix '40mg'$  daily. I/Os negative 6 L Cr 1.41 with diuresis  - would  Continue home diuretic to '40mg'$  once daily at discharge.    3. ILD - on home O2 3-4L   4. NSVT - 20 beat run this admission per Dr. Nelly Laurence note last week but can't see on telemetry.  off his lopressor at the time. Chest pain and unresponsive with ? 20 beats VT 5/8-assume ischemia. Metoprolol held since here ? because of some low BP. started low dose beta blocker bisoprolol 2.5  mg once daily. Consider Amiodarone if recurrence. Will arrange outpatient f/u.     5. Fall - unclear history, at this time not clear if any related syncope -  No further cardiac workup at this time        For questions or updates, please contact [place] Please consult [email or web address] for contact info under        Signed, Ermalinda Barrios, PA-C  08/24/2020, 8:00 AM

## 2020-08-24 NOTE — ED Triage Notes (Signed)
Pt arrives via [facility] for nosebleed. Pt given afrin bilateral nares by EMS and bleeding is controlled at this time. Pt discharged from [facility] today and started on Plavix, pt was picking his nose prior to bleeding.

## 2020-08-24 NOTE — TOC Transition Note (Signed)
Transition of Care [facility]) - CM/SW Discharge Note   Patient Details  Name: Anthony Galvan MRN: [id number] Date of Birth: [birth year]  [facility] CM/SW Contact:  Salome Arnt, LCSW Phone Number: 08/24/2020, 11:19 AM   Clinical Narrative: Pt d/c today and will return home with home health PT. Pt's daughter aware and agreeable. Linda with [facility] notified. Orders are in. Pt's daughter will pick up pt later today. RN updated.       Final next level of care: [place] Barriers to Discharge: Barriers Resolved   Patient Goals and CMS Choice Patient states their goals for this hospitalization and ongoing recovery are:: go home CMS Medicare.gov Compare Post Acute Care list provided to:: Patient Represenative (must comment) Choice offered to / list presented to : Adult Children  Discharge Placement                  Name of family member notified: Santiago Glad- daughter Patient and family notified of of transfer: 08/24/20  Discharge Plan and Services In-house Referral: Clinical Social Work   Post Acute Care Choice: Home Health          DME Arranged: N/A         HH Arranged: PT [place] Agency: [place] ([facility]) Date [place]: 08/24/20 Time [place]: 1118 Representative spoke with at [place]: [place] ([place]) Interventions     Readmission Risk Interventions Readmission Risk Prevention Plan 01/29/2019 01/28/2019  Transportation Screening - Complete  PCP or Specialist Appt within 3-5 Days Complete -  HRI or [place] - Complete  Social Work Consult for [place] Planning/Counseling - Complete  Palliative Care Screening - Not Complete  Medication Review Press photographer) - Complete  Some recent data might be hidden

## 2020-08-24 NOTE — Discharge Summary (Signed)
Physician Discharge Summary  Anthony Galvan [id number] DOB: 03-26-1922 DOA: 08/18/2020  PCP: [facility]  Admit date: 08/18/2020  Discharge date: 08/24/2020  Admitted From:Home  Disposition:  Home  Recommendations for Outpatient Follow-up:  1. Follow up with PCP in 1-2 weeks 2. Follow-up with cardiology outpatient and continue on Imdur and bisoprolol as prescribed on 3. Continue on furosemide 40 mg daily as prescribed 4. Continue other home medications as prior  Home Health:Yes with PT  Equipment/Devices:None, has home O2  Discharge Condition:Stable  CODE STATUS: DNR  Diet recommendation: Heart Healthy  Brief/Interim Summary:  Anthony Blessington Tuckeris a 87 y.o.malewith medical history significant forCAD s/p stent placement,bronchiectasis, interstitial lung disease on home oxygen of 4 LPM via [place], duodenal ulcer, GERD, H. pylori gastritis, internal hemorrhoids, hyperlipidemia, CAD, chronic diastolic heart failure, pulmonary hypertension, history of pulmonary embolismwho presents to the emergency department due to chest pain which started shortly prior to arrival to the ED.He was admitted for acute on chronic diastolic congestive heart failure exacerbation and required multiple days of IV diuresis.  He was also noted to have recurrent chest pain, but no signs of ACS.  Additionally, he had an episode of TIA on 5/8, but has recovered fully from this.  His further work-up with brain MRI was unrevealing and LDL was low.  He will be prescribed aspirin and Plavix for 1 month and then can remain on aspirin thereafter.  He is to continue his home statin as prior.  Appreciate cardiology recommendations with holding medication adjustments made.  He is not a candidate for any further intervention.  He is in stable condition today for discharge to home with home health PT.  Discharge Diagnoses:  Principal Problem:   Chest pain Active Problems:   Hyperlipidemia with target low density  lipoprotein (LDL) cholesterol less than 70 mg/dL   GERD   CAD S/P percutaneous coronary angioplasty   CKD (chronic kidney disease) stage 3, GFR 30-59 ml/min (HCC)   Interstitial lung disease ([place])   Pulmonary hypertension ([place])   Fall at home, initial encounter   Syncope   Macrocytic anemia   Symptomatic anemia   Elevated d-dimer   (HFpEF) heart failure with preserved ejection fraction (HCC)   Acute CHF ([place])   Pressure injury of skin  Principal discharge diagnosis: Acute on chronic diastolic CHF exacerbation with recurrent chest pain in the setting of known CAD.  TIA.  Discharge Instructions  Discharge Instructions    Diet - low sodium heart healthy   Complete by: As directed    Increase activity slowly   Complete by: As directed    No wound care   Complete by: As directed      Allergies as of 08/24/2020      Reactions   Lisinopril Other (See Comments)   "Allergic," per the [facility]- patient is unaware of any reaction (??)      Medication List    STOP taking these medications   metoprolol tartrate 25 MG tablet Commonly known as: LOPRESSOR     TAKE these medications   acetaminophen 325 MG tablet Commonly known as: TYLENOL Take 2 tablets (650 mg total) by mouth 3 (three) times daily as needed (pain).   alum & mag hydroxide-simeth 200-200-20 MG/5ML suspension Commonly known as: MAALOX/MYLANTA Take 30 mLs by mouth every 4 (four) hours as needed for indigestion.   aspirin EC 81 MG tablet Take 1 tablet (81 mg total) by mouth daily. Swallow whole.   atorvastatin 80 MG tablet  Commonly known as: LIPITOR Take 1 tablet (80 mg total) by mouth daily at 6 PM. What changed: how much to take   bisoprolol 5 MG tablet Commonly known as: ZEBETA Take 0.5 tablets (2.5 mg total) by mouth daily. Start taking on: Aug 25, 2020   calcium carbonate 1250 (500 Ca) MG tablet Commonly known as: OS-CAL - dosed in mg of elemental calcium Take 1 tablet (500 mg of elemental calcium total)  by mouth daily with breakfast.   clopidogrel 75 MG tablet Commonly known as: Plavix Take 1 tablet (75 mg total) by mouth daily.   donepezil 10 MG tablet Commonly known as: ARICEPT Take 10 mg by mouth daily.   furosemide 40 MG tablet Commonly known as: LASIX Take 1 tablet (40 mg total) by mouth daily. Start taking on: Aug 25, 2020   guaiFENesin 600 MG 12 hr tablet Commonly known as: MUCINEX Take 1 tablet (600 mg total) by mouth 2 (two) times daily.   ipratropium-albuterol 0.5-2.5 (3) MG/3ML Soln Commonly known as: DUONEB Take 3 mLs by nebulization 3 (three) times daily.   isosorbide mononitrate 30 MG 24 hr tablet Commonly known as: IMDUR Take 3 tablets (90 mg total) by mouth daily. What changed:   medication strength  how much to take   multivitamin with minerals Tabs tablet Take 1 tablet by mouth daily.   Nitrostat 0.4 MG SL tablet Generic drug: nitroGLYCERIN PLACE 1 TABLET UNDER TONGUE EVERY 5 MINUTES FOR 3 DOSES AS NEEDED. What changed: See the new instructions.   pantoprazole 40 MG tablet Commonly known as: PROTONIX Take 1 tablet (40 mg total) by mouth daily.   polyethylene glycol 17 g packet Commonly known as: MIRALAX / GLYCOLAX Take 17 g by mouth as needed.   Vitamin D (Ergocalciferol) 1.25 MG (50000 UNIT) Caps capsule Commonly known as: DRISDOL Take 1 capsule (50,000 Units total) by mouth every 7 (seven) days.       Follow-up [facility]. Schedule an appointment as soon as possible for a visit in 1 week(s).   Specialty: General Practice Contact information: [address] [place] [address] [phone]        Satira Sark, MD. Schedule an appointment as soon as possible for a visit in 2 week(s).   Specialty: Cardiology Contact information: [place] [address] [phone]        [facility].   Specialty: Home Health Services Why: Will contact you to  schedule home health visits.              Allergies  Allergen Reactions  . Lisinopril Other (See Comments)    "Allergic," per the [facility]- patient is unaware of any reaction (??)    Consultations:  Cardiology   Procedures/Studies: DG Chest 1 View  Result Date: 08/18/2020 CLINICAL DATA:  Near syncopal episode chest pain EXAM: CHEST  1 VIEW COMPARISON:  04/13/2020, CT chest 01/25/2019 FINDINGS: Peripheral and basilar reticular opacity and ground-glass changes, likely corresponding to interstitial lung disease on prior CT. Possible superimposed acute airspace disease at the right base and periphery of the left lower lung compared to prior. Mild cardiomegaly with aortic atherosclerosis. No pneumothorax IMPRESSION: 1. Peripheral and basilar interstitial lung disease. Possible acute superimposed infiltrates at the right base and periphery of left lung, question atypical or viral process 2. Cardiomegaly Electronically Signed   By: Donavan Foil M.D.   On: 08/18/2020 17:28   CT HEAD WO  CONTRAST  Result Date: 08/22/2020 CLINICAL DATA:  Acute neurologic deficit, unresponsive. EXAM: CT HEAD WITHOUT CONTRAST TECHNIQUE: Contiguous axial images were obtained from the base of the skull through the vertex without intravenous contrast. COMPARISON:  CT head dated 08/18/2020. FINDINGS: Brain: No evidence of acute infarction, hemorrhage, hydrocephalus, extra-axial collection or mass lesion/mass effect. There is moderate cerebral volume loss with associated ex vacuo dilatation. Periventricular white matter hypoattenuation likely represents chronic small vessel ischemic disease. Vascular: There are vascular calcifications in the carotid siphons. Skull: Normal. Negative for fracture or focal lesion. Sinuses/Orbits: There is a left ethmoid sinus disease. Other: None. IMPRESSION: No acute intracranial process. Electronically Signed   By: Zerita Boers M.D.   On: 08/22/2020 17:52   CT Head Wo Contrast  Result Date:  08/18/2020 CLINICAL DATA:  Near syncopal episode EXAM: CT HEAD WITHOUT CONTRAST TECHNIQUE: Contiguous axial images were obtained from the base of the skull through the vertex without intravenous contrast. COMPARISON:  CT brain 04/14/2019 FINDINGS: Brain: No acute territorial infarction, hemorrhage or intracranial mass. Moderate atrophy and chronic small vessel ischemic changes of the white matter. Chronic appearing lacunar infarcts or small vessel disease in the right basal ganglia and left thalamus. Stable ventricle size Vascular: No hyperdense vessels.  Carotid vascular calcification Skull: Normal. Negative for fracture or focal lesion. Sinuses/Orbits: Patchy mucosal thickening in left ethmoid sinus Other: None IMPRESSION: 1. No CT evidence for acute intracranial abnormality. 2. Atrophy and chronic small vessel ischemic change of the white matter. Electronically Signed   By: Donavan Foil M.D.   On: 08/18/2020 17:32   CT Angio Chest PE W and/or Wo Contrast  Result Date: 08/18/2020 CLINICAL DATA:  Centralized chest pain radiating to jaw, positive D dimer EXAM: CT ANGIOGRAPHY CHEST WITH CONTRAST TECHNIQUE: Multidetector CT imaging of the chest was performed using the standard protocol during bolus administration of intravenous contrast. Multiplanar CT image reconstructions and MIPs were obtained to evaluate the vascular anatomy. CONTRAST:  51m OMNIPAQUE IOHEXOL 350 MG/ML SOLN COMPARISON:  08/18/2020, 01/25/2019 FINDINGS: Cardiovascular: This is a technically adequate evaluation of the pulmonary vasculature. No filling defects or pulmonary emboli. The heart is enlarged, marked right atrial dilatation. Reflux of contrast into the hepatic veins may reflect an element of cardiac dysfunction. Trace pericardial effusion. Normal caliber of the thoracic aorta. Diffuse atherosclerosis of the aorta and coronary vasculature. Mediastinum/Nodes: No enlarged mediastinal, hilar, or axillary lymph nodes. Thyroid gland, trachea,  and esophagus demonstrate no significant findings. Lungs/Pleura: Persistent bibasilar scarring and fibrosis. There are small bilateral pleural effusions, left greater than right. Chronic bilateral interlobular septal thickening again noted, with relative sparing of the left upper lobe, most consistent with chronic interstitial lung disease. Denser areas of consolidation within the lung bases likely reflect atelectasis. Bilateral lower lobe bronchiectasis unchanged. Central airways are patent. Upper Abdomen: Small volume ascites upper abdomen. No acute findings otherwise. Musculoskeletal: There is diffuse body wall edema. No acute or destructive bony lesions. Reconstructed images demonstrate no additional findings. Review of the MIP images confirms the above findings. IMPRESSION: 1. No evidence of pulmonary embolus. 2. Small bilateral pleural effusions, left greater than right. 3. Chronic interstitial lung disease, with diffuse interlobular septal thickening as well as basilar predominant scarring and fibrosis. 4. Patchy bibasilar atelectasis. 5. Cardiomegaly, with evidence of right heart failure. Trace pericardial effusion. 6. Aortic Atherosclerosis (ICD10-I70.0). Coronary artery atherosclerosis. Electronically Signed   By: MRanda NgoM.D.   On: 08/18/2020 20:10   MR BRAIN WO CONTRAST  Result Date: 08/23/2020  CLINICAL DATA:  TIA.  Weakness and confusion for 1 week. EXAM: MRI HEAD WITHOUT CONTRAST TECHNIQUE: Multiplanar, multiecho pulse sequences of the brain and surrounding structures were obtained without intravenous contrast. COMPARISON:  Head CT 08/22/2020 FINDINGS: Brain: There is no evidence of an acute infarct, intracranial hemorrhage, mass, midline shift, or extra-axial fluid collection. Patchy T2 hyperintensities in the cerebral white matter bilaterally are nonspecific but compatible with moderate chronic small vessel ischemic disease. Chronic lacunar infarcts are noted in the cerebellum and left  thalamus. Mildly prominent dilated perivascular spaces are present in the cerebral white matter and basal ganglia bilaterally. There is mild-to-moderate generalized cerebral atrophy. Vascular: Major intracranial vascular flow voids are preserved. Skull and upper cervical spine: Unremarkable bone marrow signal. Sinuses/Orbits: Bilateral cataract extraction. Trace right and small left mastoid effusions. Clear paranasal sinuses. Other: None. IMPRESSION: 1. No acute intracranial abnormality. 2. Moderate chronic small vessel ischemic disease. Electronically Signed   By: Logan Bores M.D.   On: 08/23/2020 13:09   US Carotid Bilateral  Result Date: 08/19/2020 CLINICAL DATA:  Syncopal episode. History of CAD (post myocardial infarction), stroke/TIA hypertension and hyperlipidemia. EXAM: BILATERAL CAROTID DUPLEX ULTRASOUND TECHNIQUE: Pearline Cables scale imaging, color Doppler and duplex ultrasound were performed of bilateral carotid and vertebral arteries in the neck. COMPARISON:  07/23/2003 FINDINGS: Criteria: Quantification of carotid stenosis is based on velocity parameters that correlate the residual internal carotid diameter with NASCET-based stenosis levels, using the diameter of the distal internal carotid lumen as the denominator for stenosis measurement. The following velocity measurements were obtained: RIGHT ICA: 59/20 cm/sec CCA: [id number] cm/sec SYSTOLIC ICA/CCA RATIO:  1.0 ECA: 50 cm/sec LEFT ICA: 58/20 cm/sec CCA: 0000000 cm/sec SYSTOLIC ICA/CCA RATIO:  0.7 ECA: 43 cm/sec RIGHT CAROTID ARTERY: There is a moderate amount of eccentric echogenic plaque within the right carotid bulb (image 16), extending to involve the origin and proximal aspects of the right internal carotid artery (image 24), progressed compared to remote examination performed in 2005 though not resulting in elevated peak systolic velocities within the interrogated course of the right internal carotid artery to suggest a hemodynamically significant  stenosis. RIGHT VERTEBRAL ARTERY:  Antegrade flow LEFT CAROTID ARTERY: The left common carotid artery is noted to be tortuous (representative image 38). There is a minimal amount of eccentric echogenic plaque involving the mid (image 40) and distal (image 45) aspects of the left common carotid artery. There is a minimal amount of eccentric echogenic plaque the left carotid bulb (image 48 and 50), extending to involve the origin of the left internal carotid artery (image 58), progressed compared to remote examination performed in 2005 though not resulting in elevated peak systolic velocities within the interrogated course of the left internal carotid artery to suggest a hemodynamically significant stenosis. LEFT VERTEBRAL ARTERY:  Antegrade flow IMPRESSION: Minimal to moderate amount of bilateral atherosclerotic plaque, right greater than left, progressed compared to remote examination performed in 2005 though not resulting in a hemodynamically significant stenosis within either internal carotid artery. Electronically Signed   By: Sandi Mariscal M.D.   On: 08/19/2020 08:54   ECHOCARDIOGRAM LIMITED  Result Date: 08/19/2020    ECHOCARDIOGRAM LIMITED REPORT   Patient Name:   Anthony Galvan Date of Exam: 08/19/2020 Medical Rec #:  [id number]      Height:       64.0 in Accession #:    [id number]     Weight:       140.0 lb Date of Birth:  08/03/1921  BSA:          1.681 m Patient Age:    87 years       BP:           100/78 mmHg Patient Gender: M              HR:           70 bpm. Exam Location:  [facility] Procedure: Limited Echo, Cardiac Doppler and Limited Color Doppler Indications:    CAD S/P percutaneous coronary angioplasty  History:        Patient has prior history of Echocardiogram examinations, most                 recent 04/13/2020. CHF, CAD and Previous Myocardial Infarction;                 Risk Factors:Hypertension and Dyslipidemia. CAD S/P percutaneous                 coronary angioplasty.  Sonographer:     Alvino Chapel RCS Referring Phys: [id number] OLADAPO ADEFESO IMPRESSIONS  1. Limited study.  2. Left ventricular ejection fraction, by estimation, is 55 to 60%. The left ventricle has normal function. The left ventricle has no regional wall motion abnormalities. There is severe concentric left ventricular hypertrophy. There is the interventricular septum is flattened in systole and diastole, consistent with right ventricular pressure and volume overload.  3. Right ventricular systolic function is low normal. The right ventricular size is mildly enlarged. There is moderately elevated pulmonary artery systolic pressure. The estimated right ventricular systolic pressure is 123XX123 mmHg.  4. Right atrial size was moderately dilated.  5. A small pericardial effusion is present. The pericardial effusion is posterior to the left ventricle.  6. The mitral valve is grossly normal. Trivial mitral valve regurgitation.  7. The aortic valve is tricuspid. There is moderate calcification of the aortic valve. Aortic valve regurgitation is mild. Aortic regurgitation PHT measures 454 msec.  8. The inferior vena cava is dilated in size with <50% respiratory variability, suggesting right atrial pressure of 15 mmHg. FINDINGS  Left Ventricle: Left ventricular ejection fraction, by estimation, is 55 to 60%. The left ventricle has normal function. The left ventricle has no regional wall motion abnormalities. The left ventricular internal cavity size was normal in size. There is  severe concentric left ventricular hypertrophy. The interventricular septum is flattened in systole and diastole, consistent with right ventricular pressure and volume overload. Right Ventricle: The right ventricular size is mildly enlarged. No increase in right ventricular wall thickness. Right ventricular systolic function is low normal. There is moderately elevated pulmonary artery systolic pressure. The tricuspid regurgitant  velocity is 2.98 m/s, and with an assumed  right atrial pressure of 15 mmHg, the estimated right ventricular systolic pressure is 123XX123 mmHg. Right Atrium: Right atrial size was moderately dilated. Pericardium: A small pericardial effusion is present. The pericardial effusion is posterior to the left ventricle. Mitral Valve: The mitral valve is grossly normal. Mild mitral annular calcification. Trivial mitral valve regurgitation. Tricuspid Valve: The tricuspid valve is grossly normal. Tricuspid valve regurgitation is mild. Aortic Valve: The aortic valve is tricuspid. There is moderate calcification of the aortic valve. There is mild to moderate aortic valve annular calcification. Aortic valve regurgitation is mild. Aortic regurgitation PHT measures 454 msec. Aorta: The aortic root is normal in size and structure. Venous: The inferior vena cava is dilated in size with less than 50% respiratory variability, suggesting right atrial  pressure of 15 mmHg. LEFT VENTRICLE PLAX 2D LVIDd:         3.40 cm LVIDs:         2.30 cm LV PW:         1.60 cm LV IVS:        1.60 cm LVOT diam:     1.65 cm LV SV:         27 LV SV Index:   16 LVOT Area:     2.14 cm  RIGHT VENTRICLE TAPSE (M-mode): 1.6 cm LEFT ATRIUM         Index LA diam:    2.90 cm 1.73 cm/m  AORTIC VALVE LVOT Vmax:   71.70 cm/s LVOT Vmean:  46.300 cm/s LVOT VTI:    0.127 m AI PHT:      454 msec  AORTA Ao Root diam: 3.30 cm MITRAL VALVE               TRICUSPID VALVE MV Area (PHT): 1.84 cm    TR Peak grad:   35.5 mmHg MV Decel Time: 412 msec    TR Vmax:        298.00 cm/s MV E velocity: 61.40 cm/s MV A velocity: 87.70 cm/s  SHUNTS MV E/A ratio:  0.70        Systemic VTI:  0.13 m                            Systemic Diam: 1.65 cm Rozann Lesches MD Electronically signed by Rozann Lesches MD Signature Date/Time: 08/19/2020/5:06:24 PM    Final       Discharge Exam: Vitals:   08/24/20 0534 08/24/20 0728  BP: 112/66   Pulse: 64   Resp: 16   Temp: 97.8 F (36.6 C)   SpO2: 99% 97%   Vitals:   08/23/20  2053 08/24/20 0500 08/24/20 0534 08/24/20 0728  BP: (!) 97/54  112/66   Pulse: 67  64   Resp: 18  16   Temp: 98.1 F (36.7 C)  97.8 F (36.6 C)   TempSrc: Oral     SpO2: 99%  99% 97%  Weight:  65.7 kg    Height:        General: Pt is alert, awake, not in acute distress Cardiovascular: RRR, S1/S2 +, no rubs, no gallops Respiratory: CTA bilaterally, no wheezing, no rhonchi, 2 L nasal cannula Abdominal: Soft, NT, ND, bowel sounds + Extremities: no edema, no cyanosis    The results of significant diagnostics from this hospitalization (including imaging, microbiology, ancillary and laboratory) are listed below for reference.     Microbiology: Recent Results (from the past 240 hour(s))  Resp Panel by RT-PCR (Flu A&B, Covid) Nasopharyngeal Swab     Status: None   Collection Time: 08/18/20  8:53 PM   Specimen: Nasopharyngeal Swab; Nasopharyngeal(NP) swabs in vial transport medium  Result Value Ref Range Status   SARS Coronavirus 2 by RT PCR NEGATIVE NEGATIVE Final    Comment: (NOTE) SARS-CoV-2 target nucleic acids are NOT DETECTED.  The SARS-CoV-2 RNA is generally detectable in upper respiratory specimens during the acute phase of infection. The lowest concentration of SARS-CoV-2 viral copies this assay can detect is 138 copies/mL. A negative result does not preclude SARS-Cov-2 infection and should not be used as the sole basis for treatment or other patient management decisions. A negative result may occur with  improper specimen collection/handling, submission of specimen other than nasopharyngeal swab,  presence of viral mutation(s) within the areas targeted by this assay, and inadequate number of viral copies(<138 copies/mL). A negative result must be combined with clinical observations, patient history, and epidemiological information. The expected result is Negative.  Fact Sheet for Patients:  EntrepreneurPulse.com.au  Fact Sheet for Healthcare  Providers:  IncredibleEmployment.be  This test is no t yet approved or cleared by the [place] FDA and  has been authorized for detection and/or diagnosis of SARS-CoV-2 by FDA under an Emergency Use Authorization (EUA). This EUA will remain  in effect (meaning this test can be used) for the duration of the COVID-19 declaration under Section 564(b)(1) of the Act, 21 U.S.C.section 360bbb-3(b)(1), unless the authorization is terminated  or revoked sooner.       Influenza A by PCR NEGATIVE NEGATIVE Final   Influenza B by PCR NEGATIVE NEGATIVE Final    Comment: (NOTE) The Xpert Xpress SARS-CoV-2/FLU/RSV plus assay is intended as an aid in the diagnosis of influenza from Nasopharyngeal swab specimens and should not be used as a sole basis for treatment. Nasal washings and aspirates are unacceptable for Xpert Xpress SARS-CoV-2/FLU/RSV testing.  Fact Sheet for Patients: EntrepreneurPulse.com.au  Fact Sheet for Healthcare Providers: IncredibleEmployment.be  This test is not yet approved or cleared by the [place] FDA and has been authorized for detection and/or diagnosis of SARS-CoV-2 by FDA under an Emergency Use Authorization (EUA). This EUA will remain in effect (meaning this test can be used) for the duration of the COVID-19 declaration under Section 564(b)(1) of the Act, 21 U.S.C. section 360bbb-3(b)(1), unless the authorization is terminated or revoked.  Performed at [facility], [address], [place], [place] [address]      Labs: BNP (last 3 results) Recent Labs    04/12/20 1722 08/19/20 0340  BNP 437.0* [id number]*   Basic Metabolic Panel: Recent Labs  Lab 08/19/20 0340 08/20/20 0541 08/21/20 0513 08/22/20 0409 08/22/20 1646 08/23/20 0427 08/24/20 0615  NA 140   < > 139 136 138 137 135  K 4.1   < > 3.9 3.9 4.1 3.9 4.0  CL 105   < > 100 94* 96* 95* 93*  CO2 28   < > 32 35* 34* 34* 33*  GLUCOSE 96    < > 73 74 76 77 75  BUN 25*   < > 27* 27* 27* 27* 32*  CREATININE 1.32*   < > 1.31* 1.27* 1.33* 1.31* 1.41*  CALCIUM 8.5*   < > 8.6* 8.3* 8.4* 8.4* 8.5*  MG 1.7   < > 1.8 1.7 1.6* 2.0 1.9  PHOS 3.6  --   --   --  2.9  --   --    < > = values in this interval not displayed.   Liver Function Tests: Recent Labs  Lab 08/19/20 0340 08/22/20 1646  AST 36  --   ALT 24  --   ALKPHOS 66  --   BILITOT 1.0  --   PROT 6.2*  --   ALBUMIN 3.0* 3.2*   No results for input(s): LIPASE, AMYLASE in the last 168 hours. No results for input(s): AMMONIA in the last 168 hours. CBC: Recent Labs  Lab 08/18/20 1742 08/19/20 0340 08/19/20 0611 08/20/20 0541 08/22/20 1646  WBC 8.5 TEST WILL BE CREDITED  --  9.3 11.9*  HGB 10.8* TEST WILL BE CREDITED 11.0* 10.1* 9.9*  HCT 37.3* TEST WILL BE CREDITED 37.8* 34.1* 32.6*  MCV 104.8* TEST WILL BE CREDITED  --  101.2*  97.9  PLT 126* TEST WILL BE CREDITED  --  120* 127*   Cardiac Enzymes: No results for input(s): CKTOTAL, CKMB, CKMBINDEX, TROPONINI in the last 168 hours. BNP: Invalid input(s): POCBNP CBG: Recent Labs  Lab 08/18/20 1707 08/22/20 1629  GLUCAP 91 112*   D-Dimer No results for input(s): DDIMER in the last 72 hours. Hgb A1c Recent Labs    08/23/20 0427  HGBA1C 4.8   Lipid Profile Recent Labs    08/23/20 0427  CHOL 102  HDL 61  LDLCALC 30  TRIG 57  CHOLHDL 1.7   Thyroid function studies No results for input(s): TSH, T4TOTAL, T3FREE, THYROIDAB in the last 72 hours.  Invalid input(s): FREET3 Anemia work up No results for input(s): VITAMINB12, FOLATE, FERRITIN, TIBC, IRON, RETICCTPCT in the last 72 hours. Urinalysis    Component Value Date/Time   COLORURINE YELLOW 08/18/2020 2337   APPEARANCEUR HAZY (A) 08/18/2020 2337   APPEARANCEUR Clear 01/18/2017 1554   LABSPEC 1.025 08/18/2020 2337   PHURINE 5.0 08/18/2020 [place] 08/18/2020 2337   HGBUR NEGATIVE 08/18/2020 2337   BILIRUBINUR NEGATIVE  08/18/2020 2337   BILIRUBINUR Negative 01/18/2017 [place] 08/18/2020 2337   PROTEINUR 30 (A) 08/18/2020 2337   UROBILINOGEN 1.0 08/27/2009 0830   NITRITE NEGATIVE 08/18/2020 2337   LEUKOCYTESUR NEGATIVE 08/18/2020 2337   Sepsis Labs Invalid input(s): PROCALCITONIN,  WBC,  LACTICIDVEN Microbiology Recent Results (from the past 240 hour(s))  Resp Panel by RT-PCR (Flu A&B, Covid) Nasopharyngeal Swab     Status: None   Collection Time: 08/18/20  8:53 PM   Specimen: Nasopharyngeal Swab; Nasopharyngeal(NP) swabs in vial transport medium  Result Value Ref Range Status   SARS Coronavirus 2 by RT PCR NEGATIVE NEGATIVE Final    Comment: (NOTE) SARS-CoV-2 target nucleic acids are NOT DETECTED.  The SARS-CoV-2 RNA is generally detectable in upper respiratory specimens during the acute phase of infection. The lowest concentration of SARS-CoV-2 viral copies this assay can detect is 138 copies/mL. A negative result does not preclude SARS-Cov-2 infection and should not be used as the sole basis for treatment or other patient management decisions. A negative result may occur with  improper specimen collection/handling, submission of specimen other than nasopharyngeal swab, presence of viral mutation(s) within the areas targeted by this assay, and inadequate number of viral copies(<138 copies/mL). A negative result must be combined with clinical observations, patient history, and epidemiological information. The expected result is Negative.  Fact Sheet for Patients:  EntrepreneurPulse.com.au  Fact Sheet for Healthcare Providers:  IncredibleEmployment.be  This test is no t yet approved or cleared by the [place] FDA and  has been authorized for detection and/or diagnosis of SARS-CoV-2 by FDA under an Emergency Use Authorization (EUA). This EUA will remain  in effect (meaning this test can be used) for the duration of the COVID-19  declaration under Section 564(b)(1) of the Act, 21 U.S.C.section 360bbb-3(b)(1), unless the authorization is terminated  or revoked sooner.       Influenza A by PCR NEGATIVE NEGATIVE Final   Influenza B by PCR NEGATIVE NEGATIVE Final    Comment: (NOTE) The Xpert Xpress SARS-CoV-2/FLU/RSV plus assay is intended as an aid in the diagnosis of influenza from Nasopharyngeal swab specimens and should not be used as a sole basis for treatment. Nasal washings and aspirates are unacceptable for Xpert Xpress SARS-CoV-2/FLU/RSV testing.  Fact Sheet for Patients: EntrepreneurPulse.com.au  Fact Sheet for Healthcare Providers: IncredibleEmployment.be  This test is not yet  approved or cleared by the [place] and has been authorized for detection and/or diagnosis of SARS-CoV-2 by FDA under an Emergency Use Authorization (EUA). This EUA will remain in effect (meaning this test can be used) for the duration of the COVID-19 declaration under Section 564(b)(1) of the Act, 21 U.S.C. section 360bbb-3(b)(1), unless the authorization is terminated or revoked.  Performed at [facility], [address], [place], [place] [address]      Time coordinating discharge: 35 minutes  SIGNED:   Rodena Goldmann, DO [facility] 08/24/2020, 11:31 AM  If 7PM-7AM, please contact night-coverage [email or web address]

## 2020-08-24 NOTE — Plan of Care (Signed)

## 2020-08-24 NOTE — ED Provider Notes (Signed)
[facility] EMERGENCY DEPARTMENT Provider Note   CSN: [id number] Arrival date & time: 08/24/20  1951     History Chief Complaint  Patient presents with  . Epistaxis    COTTRELL LHUILLIER is a 87 y.o. male.  HPI     Patient comes in a chief complaint of epistaxis.  Past medical history significant forCAD s/p stent placement,bronchiectasis, interstitial lung disease on home oxygen of 4 LPM via [place], duodenal ulcer, GERD, H. pylori gastritis, internal hemorrhoids, hyperlipidemia, CAD, chronic diastolic heart failure, pulmonary hypertension, history of pulmonary embolismnot on anticoagulation.  He reports that he starting sudden onset severe bleeding from his left nare.  EMS was called.  Patient received Afrin spray, pressure and the bleeding has stopped.  Patient is on chronic oxygen reports that he might be dry in his naris.  He has had bleeding in the past through his nares as well.  Daughter at the bedside suspects that he might be picking his nose.  Of note, patient has been having some forgetful events and daughter reports that patient desats a lot at home.  It was noted that he is taking 2 L of oxygen at home, but when patient was discharged just earlier today he was requiring 4 L of oxygen.  This information has been provided to the family.  Past Medical History:  Diagnosis Date  . Bronchiectasis ([place]) 06/13/2018  . CAD (coronary artery disease) 08/01/2012   BMS mid LAD; catheterization in July 2018 showed with severe heavily calcified distal circumflex-left PDA stenosis, not favorable for PCI.  Marland Kitchen Congestive heart failure (CHF) ([place])   . Duodenal ulcer May 2011   on EGD  . Essential hypertension   . GERD (gastroesophageal reflux disease)   . Helicobacter pylori gastritis 2012  . Hyperlipidemia   . ILD (interstitial lung disease) ([place])   . MI (myocardial infarction) ([place]) 1976, 1980, [place] for at [facility] (Dr. Iona Beard)  . Osteoarthritis   .  Pulmonary embolism ([place])   . Pulmonary hypertension ([place])   . S/P colonoscopy 2011   normal, internal hemorrhoids    Patient Active Problem List   Diagnosis Date Noted  . Pressure injury of skin 08/24/2020  . Fall at home, initial encounter 08/19/2020  . Syncope 08/19/2020  . Macrocytic anemia 08/19/2020  . Symptomatic anemia 08/19/2020  . Elevated d-dimer 08/19/2020  . (HFpEF) heart failure with preserved ejection fraction ([place]) 08/19/2020  . Acute CHF ([place]) 08/19/2020  . Chest pain 08/18/2020  . Hyperbilirubinemia 04/13/2020  . CKD (chronic kidney disease), stage V ([place]) 04/13/2020  . NSTEMI (non-ST elevated myocardial infarction) ([place]) 04/13/2020  . Acute coronary syndrome with high troponin ([place]) 04/12/2020  . Constipation 05/29/2019  . Acute on chronic respiratory failure ([place])   . Cor pulmonale (chronic) ([place]) 01/26/2019  . Chronic respiratory failure with hypoxia ([place]) 01/25/2019  . Bronchiectasis ([place]) 01/25/2019  . Acute CHF (congestive heart failure) ([place]) 01/24/2019  . Normocytic anemia 01/24/2019  . Thrombocytopenia ([place]) 01/24/2019  . Interstitial lung disease ([place]) 06/15/2018  . Pulmonary hypertension ([place]) 06/15/2018  . Acute pulmonary embolus ([place]) 06/14/2018  . Acute on chronic diastolic CHF (congestive heart failure) ([place]) 06/12/2018  . Acute respiratory failure with hypoxia ([place]) 06/12/2018  . Fracture 05/07/2018  . Closed displaced intertrochanteric fracture of left femur ([place])   . Coronary artery disease involving native heart without angina pectoris   . Dyspepsia 01/18/2017  . Closed disp intertrochanteric fracture of right femur with  nonunion 11/30/2016  . PUD (peptic ulcer disease)   . History of coronary artery stent placement 10/08/2016  . Acute on chronic renal failure ([place]) 10/08/2016  . CKD (chronic kidney disease) stage 3, GFR 30-59 ml/min (HCC) 10/08/2016  . Bloating 09/03/2013  . CAD S/P percutaneous coronary angioplasty   . MI (myocardial  infarction) ([place])   . Presence of bare metal stent in LAD coronary artery - VerifFlex BMS 3.0 mm x 12 mm - post-dilated to 3.5 mm 08/01/2012  . Unstable angina ([place]) 07/30/2012  . Gastritis 12/15/2010  . Nausea 08/30/2010  . Hyperlipidemia with target low density lipoprotein (LDL) cholesterol less than 70 mg/dL 05/21/2006  . CATARACT NOS 05/21/2006  . Essential hypertension 05/21/2006  . MYOCARDIAL INFARCTION, HX OF 05/21/2006  . GERD 05/21/2006  . OVERACTIVE BLADDER 05/21/2006  . OSTEOARTHRITIS 05/21/2006  . URINARY INCONTINENCE 05/21/2006    Past Surgical History:  Procedure Laterality Date  . CARDIAC CATHETERIZATION  01/30/2007   D1 75 %, mid LAD 50%, 50-70% circumflex, 50-70% RCA.(Dr. Adora Fridge)  . CARDIAC CATHETERIZATION  12/11/2001   normal L main, small RCA, dominant LL Cfx with mild diffuse disease, LAD with mid 10-20% stenosis, ramus intermedius with mild diffuse disease (Dr. Jackie Plum)  . CATARACT EXTRACTION    . COLONOSCOPY  OCT 2011 ARS   [facility] [place]   in setting of MI  . ESOPHAGOGASTRODUODENOSCOPY N/A 10/09/2016   Procedure: ESOPHAGOGASTRODUODENOSCOPY (EGD);  Surgeon: Danie Binder, MD;  [facility];  Service: Endoscopy;  Laterality: N/A;  . FEMUR IM NAIL Right 12/01/2016   Procedure: INTRAMEDULLARY (IM) NAIL RIGHT HIP;  Surgeon: Altamese Delway, MD;  Location: [facility];  Service: Orthopedics;  Laterality: Right;  . FEMUR IM NAIL Left 05/07/2018   Procedure: INTRAMEDULLARY (IM) NAIL FEMORAL;  Surgeon: Nicholes Stairs, MD;  Location: [facility];  Service: Orthopedics;  Laterality: Left;  . LEFT HEART CATH AND CORONARY ANGIOGRAPHY N/A 10/23/2016   Procedure: Left Heart Cath and Coronary Angiography;  Surgeon: Sherren Mocha, MD;  Location: [facility] CV LAB;  Service: Cardiovascular: Two-vessel CAD (left dominant).  Patent BMS in p LAD.  Severe, heavily calcified dCx-LPDA lesion.  Not favorable for PCI.  Marland Kitchen LEFT HEART CATHETERIZATION  WITH CORONARY ANGIOGRAM N/A 08/01/2012   Procedure: LEFT HEART CATHETERIZATION WITH CORONARY ANGIOGRAM;  Surgeon: Leonie Man, MD;  Location: [facility] CATH LAB: Mid LAD 99% apple core; D2 ostial 70-80% (2 small for PCI) small nondominant RCA 60-70%. Distal circumflex/L PDA ~50% --> PCI of LAD  . NM MYOCAR PERF WALL MOTION  08/2003   adenosine stress - focal decreased perfusion defect in distal inferior wall, no significant ischemic changes  . PERCUTANEOUS STENT INTERVENTION  08/01/2012   Procedure: PERCUTANEOUS STENT INTERVENTION;  Surgeon: Leonie Man, MD;  Location: [facility] CATH LAB; ;PCI of mid LAD with a VeriFlex Bare Metal Stent 3.0 mm x 12 mm - post-dilated to 3.5 mm.  . TRANSTHORACIC ECHOCARDIOGRAM  10/2016   mild LVH - 60-65%. Gr 1 DD. No RWMA. Mild MR. Mod TR. Mild RA dilation.  PAP ~ 60 mmHg.  Marland Kitchen UPPER GASTROINTESTINAL ENDOSCOPY  08/2009   MELENA, HEMATEMESIS --> DUODENAL ULCER, Bx: H PYLORI POS       Family History  Problem Relation Age of Onset  . Heart disease Mother   . Kidney disease Father   . Heart disease Brother 17  . Heart disease Sister   . Colon cancer Neg Hx  Social History   Tobacco Use  . Smoking status: Never Smoker  . Smokeless tobacco: Never Used  Vaping Use  . Vaping Use: Never used  Substance Use Topics  . Alcohol use: No  . Drug use: No    Home Medications Prior to Admission medications   Medication Sig Start Date End Date Taking? Authorizing Provider  acetaminophen (TYLENOL) 325 MG tablet Take 2 tablets (650 mg total) by mouth 3 (three) times daily as needed (pain). Patient taking differently: Take 650 mg by mouth in the morning and at bedtime. 12/04/16  Yes Ainsley Spinner, PA-C  alum & mag hydroxide-simeth (MAALOX/MYLANTA) 200-200-20 MG/5ML suspension Take 30 mLs by mouth every 4 (four) hours as needed for indigestion. 01/29/19  Yes Johnson, Clanford L, MD  aspirin EC 81 MG tablet Take 1 tablet (81 mg total) by mouth daily. Swallow whole. 10/01/19  Yes  Strader, [place] M, PA-C  atorvastatin (LIPITOR) 80 MG tablet Take 1 tablet (80 mg total) by mouth daily at 6 PM. Patient taking differently: Take 40 mg by mouth daily at 6 PM. 08/02/12  Yes Brett Canales, PA-C  bisoprolol (ZEBETA) 5 MG tablet Take 0.5 tablets (2.5 mg total) by mouth daily. 08/25/20 09/24/20 Yes Shah, Pratik D, DO  calcium carbonate (OS-CAL - DOSED IN MG OF ELEMENTAL CALCIUM) 1250 (500 Ca) MG tablet Take 1 tablet (500 mg of elemental calcium total) by mouth daily with breakfast. 05/13/18  Yes Mullis, Kiersten P, DO  clopidogrel (PLAVIX) 75 MG tablet Take 1 tablet (75 mg total) by mouth daily. 08/24/20 09/23/20 Yes Shah, Pratik D, DO  dextromethorphan-guaiFENesin (ROBITUSSIN-DM) 10-100 MG/5ML liquid TAKE 2 TEASPOONSFUL BY MOUTH TWO TIMES A DAY AS NEEDED FOR COUGH 12/17/19  Yes [provider]  donepezil (ARICEPT) 10 MG tablet Take 10 mg by mouth daily.   Yes [provider]  furosemide (LASIX) 40 MG tablet Take 1 tablet (40 mg total) by mouth daily. 08/25/20 09/24/20 Yes Shah, Pratik D, DO  guaiFENesin (MUCINEX) 600 MG 12 hr tablet Take 1 tablet (600 mg total) by mouth 2 (two) times daily. 06/15/18  Yes Kathie Dike, MD  ipratropium-albuterol (DUONEB) 0.5-2.5 (3) MG/3ML SOLN Take 3 mLs by nebulization 3 (three) times daily. 01/29/19  Yes Johnson, Clanford L, MD  isosorbide mononitrate (IMDUR) 30 MG 24 hr tablet Take 3 tablets (90 mg total) by mouth daily. 08/24/20 09/23/20 Yes Shah, Pratik D, DO  mirtazapine (REMERON) 7.5 MG tablet TAKE ONE TABLET BY MOUTH AT BEDTIME FOR DEPRESSION 06/25/20  Yes [provider]  Multiple Vitamin (MULTIVITAMIN WITH MINERALS) TABS tablet Take 1 tablet by mouth daily.   Yes [provider]  NITROSTAT 0.4 MG SL tablet PLACE 1 TABLET UNDER TONGUE EVERY 5 MINUTES FOR 3 DOSES AS NEEDED. Patient taking differently: Place 0.4 mg under the tongue every 5 (five) minutes as needed for chest pain. 11/17/13  Yes Leonie Man, MD   pantoprazole (PROTONIX) 40 MG tablet Take 1 tablet (40 mg total) by mouth daily. 01/30/19  Yes Johnson, Clanford L, MD  polyethylene glycol (MIRALAX / GLYCOLAX) 17 g packet Take 17 g by mouth as needed. 04/19/20  Yes Geradine Girt, DO  Vitamin D, Ergocalciferol, (DRISDOL) 1.25 MG (50000 UT) CAPS capsule Take 1 capsule (50,000 Units total) by mouth every 7 (seven) days. 05/16/18  Yes Mullis, Kiersten P, DO  Zinc Oxide 10 % OINT Apply 1 application topically daily as needed.   Yes [provider]  metoprolol succinate (TOPROL-XL) 25 MG 24  hr tablet TAKE ONE-HALF TABLET BY MOUTH EVERY DAY FOR HEART AND BLOOD PRESSURE Patient not taking: No sig reported 06/08/20   [provider]    Allergies    Lisinopril  Review of Systems   Review of Systems  Constitutional: Positive for activity change.  HENT: Positive for nosebleeds.   Respiratory: Positive for shortness of breath.   Cardiovascular: Negative for chest pain.  Hematological: Does not bruise/bleed easily.  All other systems reviewed and are negative.   Physical Exam Updated Vital Signs BP (!) 92/58   Pulse 68   Temp (!) 97.5 F (36.4 C) (Oral)   Resp 18   Ht '5\' 4"'$  (1.626 m)   Wt 65.7 kg   SpO2 100%   BMI 24.86 kg/m   Physical Exam Vitals and nursing note reviewed.  Constitutional:      Appearance: He is well-developed.  HENT:     Head: Atraumatic.     Nose:     Comments: Dry blood over the anterior septum, left nare. Eyes:     Extraocular Movements: Extraocular movements intact.     Pupils: Pupils are equal, round, and reactive to light.  Cardiovascular:     Rate and Rhythm: Normal rate.  Pulmonary:     Effort: Pulmonary effort is normal.  Musculoskeletal:     Cervical back: Neck supple.  Skin:    General: Skin is warm.  Neurological:     Mental Status: He is alert and oriented to person, place, and time.     ED Results / Procedures / Treatments   Labs (all labs ordered are listed, but only  abnormal results are displayed) Labs Reviewed - No data to display  EKG None  Radiology MR BRAIN WO CONTRAST  Result Date: 08/23/2020 CLINICAL DATA:  TIA.  Weakness and confusion for 1 week. EXAM: MRI HEAD WITHOUT CONTRAST TECHNIQUE: Multiplanar, multiecho pulse sequences of the brain and surrounding structures were obtained without intravenous contrast. COMPARISON:  Head CT 08/22/2020 FINDINGS: Brain: There is no evidence of an acute infarct, intracranial hemorrhage, mass, midline shift, or extra-axial fluid collection. Patchy T2 hyperintensities in the cerebral white matter bilaterally are nonspecific but compatible with moderate chronic small vessel ischemic disease. Chronic lacunar infarcts are noted in the cerebellum and left thalamus. Mildly prominent dilated perivascular spaces are present in the cerebral white matter and basal ganglia bilaterally. There is mild-to-moderate generalized cerebral atrophy. Vascular: Major intracranial vascular flow voids are preserved. Skull and upper cervical spine: Unremarkable bone marrow signal. Sinuses/Orbits: Bilateral cataract extraction. Trace right and small left mastoid effusions. Clear paranasal sinuses. Other: None. IMPRESSION: 1. No acute intracranial abnormality. 2. Moderate chronic small vessel ischemic disease. Electronically Signed   By: Logan Bores M.D.   On: 08/23/2020 13:09   DG Chest Port 1 View  Result Date: 08/24/2020 CLINICAL DATA:  87 year old male with epistaxis. EXAM: PORTABLE CHEST 1 VIEW COMPARISON:  Chest radiograph dated 08/18/2020 and CT dated 08/18/2020 FINDINGS: There is diffuse chronic interstitial coarsening and bibasilar atelectasis/scarring. No focal consolidation, pleural effusion, or pneumothorax. Stable mild cardiomegaly. Atherosclerotic calcification of the aorta. Osteopenia with degenerative changes of the spine. No acute osseous pathology. IMPRESSION: No active disease. Electronically Signed   By: Anner Crete M.D.    On: 08/24/2020 23:03    Procedures Procedures   Medications Ordered in ED Medications - No data to display  ED Course  I have reviewed the triage vital signs and the nursing notes.  Pertinent labs & imaging results that  were available during my care of the patient were reviewed by me and considered in my medical decision making (see chart for details).    MDM Rules/Calculators/A&P                          87 year old male comes in a chief complaint of bloody nose.  Epistaxis resolved with pressure and Afrin spray.  He is monitored in the ED for couple of hours.  No recurrence.  Root cause appears to be dryness, likely from the chronic O2 use and perhaps some picking of the nose.  He is on Plavix, aspirin but no anticoagulation.  Afrin spray provided to go home with.  Return precautions discussed.  Also, advised to increase her home oxygen to 4 L/min as per the discharge summary instructions.  Final Clinical Impression(s) / ED Diagnoses Final diagnoses:  Epistaxis    Rx / DC Orders ED Discharge Orders    None       Varney Biles, MD 08/24/20 2334

## 2020-08-25 NOTE — ED Notes (Signed)
Pt awaiting EMS for transport home. Daughter at bedside. Diet soda and crackers provided. Denies further needs at this time. VSS. Call bell within reach. Bed locked and low.

## 2020-09-02 ENCOUNTER — Ambulatory Visit: Payer: Medicare Other | Admitting: Podiatry

## 2020-09-19 NOTE — Progress Notes (Signed)
Cardiology Office Note:    Date:  09/20/2020   ID:  Anthony Galvan, DOB Jan 08, 1922, MRN [id number]  PCP:  [facility] [facility] Cardiologist:  Rozann Lesches, MD     Referring MD: [facility]*   Chief Complaint:  Hospitalization Follow-up (CHF)    Patient Profile:    Anthony Galvan is a 87 y.o. male with:   Coronary artery disease   S/p BMS to mLAD in 4/14  Cath 2018: patent LAD stent; dist LCx and apical LAD dz tx med  NSTEMI 12/21 >>  Conservative Mgmt due to CKD/age  NSVT   Aortic atherosclerosis   ILD (Interstitial Lung Disease); chronic O2  (HFpEF) heart failure with preserved ejection fraction   Pulmonary hypertension   Mod to severe TR  Hx of pulmonary embolism in 05/2018  Hypertension   Hyperlipidemia   Chronic kidney disease III  GERD   Mainly wheelchair bound  DNR   Prior CV studies: Echocardiogram 08/19/20 EF 55-60, no RWMA, severe LVH, evidence of RV pressure and volume overload, low normal RVSF, moderate pulmonary hypertension, RVSP 50.5, moderate RAE, small pericardial effusion, trivial MR, mild AI  Carotid US 08/19/20 IMPRESSION: Minimal to moderate amount of bilateral atherosclerotic plaque, right greater than left, progressed compared to remote examination performed in 2005 though not resulting in a hemodynamically significant stenosis within either internal carotid artery.  LEFT HEART CATH AND CORONARY ANGIOGRAPHY 10/23/2016 Narrative 1. Two-vessel coronary artery disease (left dominant) with patency of the stented segment in the proximal LAD and severe heavily calcified distal circumflex/left PDA stenosis unfavorable for PCI 2. Known normal LV function by noninvasive assessment 3. Normal LVEDP Recommend: Medical therapy for CAD. The distal circumflex/PDA lesion is severely calcified with a distal vessel location that is unfavorable for PCI and I would be very hesitant to use atherectomy in  this distal location.    History of Present Illness: Anthony Galvan was last seen in clinic by Bernerd Pho, PA-C in 6/21.  He was admitted in 12/21 with a NSTEMI and AKI.  An echocardiogram demonstrated preserved LVF.  Med Rx was recommended given his poor renal function.  He was recently admitted 5/4-5/10 with chest pain in the setting of decompensated (HFpEF) heart failure with preserved ejection fraction and ?syncope. He was diuresed.  Cardiac enzymes were minimally elevated without significant trend.  An echocardiogram showed preserved LVF.  He was noted to have NSVT on tele.  He was continued on beta-blocker and electrolytes were corrected.  He had a TIA during his admission and was tx with DAPT for 1 month then ASA alone.  He returns for f/u.     He is here today with his daughter.  He arrives in a wheelchair.  He is unable to weigh at home.  He is on chronic O2.  He feels his breathing has gotten worse since discharge from the hospital.  He sleeps on an incline.  He sometimes has PND.  He has significant lower extremity swelling up to his thighs.  He also has scrotal swelling as well as abdominal swelling.  He has occasional chest pain.  He takes nitroglycerin for this.  He also has relief of chest pain with eating.  He has not had syncope.        Past Medical History:  Diagnosis Date  . Bronchiectasis ([place]) 06/13/2018  . CAD (coronary artery disease) 08/01/2012   BMS mid LAD; catheterization in July 2018 showed with severe  heavily calcified distal circumflex-left PDA stenosis, not favorable for PCI.  Marland Kitchen Congestive heart failure (CHF) ([place])   . Duodenal ulcer May 2011   on EGD  . Essential hypertension   . GERD (gastroesophageal reflux disease)   . Helicobacter pylori gastritis 2012  . Hyperlipidemia   . ILD (interstitial lung disease) ([place])   . MI (myocardial infarction) ([place]) 1976, 1980, [place] for at [facility] and [place] (Dr. Iona Beard)  . Osteoarthritis   .  Pulmonary embolism ([place])   . Pulmonary hypertension ([place])   . S/P colonoscopy 2011   normal, internal hemorrhoids    Current Medications: Current Meds  Medication Sig  . acetaminophen (TYLENOL) 325 MG tablet Take 2 tablets (650 mg total) by mouth 3 (three) times daily as needed (pain). (Patient taking differently: Take 650 mg by mouth in the morning and at bedtime.)  . alum & mag hydroxide-simeth (MAALOX/MYLANTA) 200-200-20 MG/5ML suspension Take 30 mLs by mouth every 4 (four) hours as needed for indigestion.  Marland Kitchen aspirin EC 81 MG tablet Take 1 tablet (81 mg total) by mouth daily. Swallow whole.  Marland Kitchen atorvastatin (LIPITOR) 80 MG tablet Take 1 tablet (80 mg total) by mouth daily at 6 PM.  . bisoprolol (ZEBETA) 5 MG tablet Take 0.5 tablets (2.5 mg total) by mouth daily.  . calcium carbonate (OS-CAL - DOSED IN MG OF ELEMENTAL CALCIUM) 1250 (500 Ca) MG tablet Take 1 tablet (500 mg of elemental calcium total) by mouth daily with breakfast.  . dextromethorphan-guaiFENesin (ROBITUSSIN-DM) 10-100 MG/5ML liquid TAKE 2 TEASPOONSFUL BY MOUTH TWO TIMES A DAY AS NEEDED FOR COUGH  . donepezil (ARICEPT) 10 MG tablet Take 10 mg by mouth daily.  Marland Kitchen ipratropium-albuterol (DUONEB) 0.5-2.5 (3) MG/3ML SOLN Take 3 mLs by nebulization 3 (three) times daily.  . isosorbide mononitrate (IMDUR) 30 MG 24 hr tablet Take 3 tablets (90 mg total) by mouth daily.  . mirtazapine (REMERON) 7.5 MG tablet TAKE ONE TABLET BY MOUTH AT BEDTIME FOR DEPRESSION  . Multiple Vitamin (MULTIVITAMIN WITH MINERALS) TABS tablet Take 1 tablet by mouth daily.  Marland Kitchen NITROSTAT 0.4 MG SL tablet PLACE 1 TABLET UNDER TONGUE EVERY 5 MINUTES FOR 3 DOSES AS NEEDED. (Patient taking differently: Place 0.4 mg under the tongue every 5 (five) minutes as needed for chest pain.)  . pantoprazole (PROTONIX) 40 MG tablet Take 1 tablet (40 mg total) by mouth daily.  . polyethylene glycol (MIRALAX / GLYCOLAX) 17 g packet Take 17 g by mouth as needed.  . torsemide  (DEMADEX) 20 MG tablet Take 40 mg am (2 tablets) and 20 mg pm (1 tablets)  . Vitamin D, Ergocalciferol, (DRISDOL) 1.25 MG (50000 UT) CAPS capsule Take 1 capsule (50,000 Units total) by mouth every 7 (seven) days.  . Zinc Oxide 10 % OINT Apply 1 application topically daily as needed.  . [DISCONTINUED] furosemide (LASIX) 40 MG tablet Take 1 tablet (40 mg total) by mouth daily.     Allergies:   Lisinopril   Social History   Tobacco Use  . Smoking status: Never Smoker  . Smokeless tobacco: Never Used  Vaping Use  . Vaping Use: Never used  Substance Use Topics  . Alcohol use: No  . Drug use: No     Family Hx: The patient's family history includes Heart disease in his mother and sister; Heart disease (age of onset: 2) in his brother; Kidney disease in his father. There is no history of Colon cancer.  Review of Systems  Gastrointestinal: Negative for hematochezia and melena.  Genitourinary: Negative for dysuria and hematuria.     EKGs/Labs/Other Test Reviewed:    EKG:  EKG is not ordered today.  The ekg ordered today demonstrates n/a  Recent Labs: 08/19/2020: ALT 24; B Natriuretic Peptide 962.0 08/22/2020: Hemoglobin 9.9; Platelets 127 08/24/2020: BUN 32; Creatinine, Ser 1.41; Magnesium 1.9; Potassium 4.0; Sodium 135   Recent Lipid Panel Lab Results  Component Value Date/Time   CHOL 102 08/23/2020 04:27 AM   TRIG 57 08/23/2020 04:27 AM   HDL 61 08/23/2020 04:27 AM   LDLCALC 30 08/23/2020 04:27 AM      Risk Assessment/Calculations:      Physical Exam:    VS:  BP 120/60   Pulse 75   Ht '5\' 4"'$  (1.626 m)   Wt 144 lb (65.3 kg)   SpO2 97%   BMI 24.72 kg/m     Wt Readings from Last 3 Encounters:  09/20/20 144 lb (65.3 kg)  08/24/20 144 lb 13.5 oz (65.7 kg)  08/24/20 144 lb 13.5 oz (65.7 kg)     Constitutional:      Appearance: Not in distress. Chronically ill-appearing.  Neck:     Vascular: JVR present. JVD normal.  Pulmonary:     Effort: Pulmonary effort is  normal.     Breath sounds: No wheezing. Bibasilar Rales present.  Cardiovascular:     Normal rate. Regular rhythm. Normal S1. Normal S2.     Murmurs: There is a grade 2/6 holosystolic murmur at the URSB and ULSB.  Edema:    Thigh: 1+ edema of the left thigh.    Pretibial: bilateral 2+ edema of the pretibial area.    Ankle: bilateral 2+ edema of the ankle. Abdominal:     General: There is distension.     Palpations: Abdomen is soft.  Skin:    General: Skin is warm and dry.  Genitourinary:    Comments: + Scrotal edema Neurological:     General: No focal deficit present.     Mental Status: Alert and oriented to person, place and time.     Cranial Nerves: Cranial nerves are intact.         ASSESSMENT & PLAN:    1. Acute on chronic heart failure with preserved ejection fraction ([place]) He mainly has right-sided heart failure.  I suspect this is related to his chronic lung disease.  He is 89 and a DNR.  Hopefully we can keep him out of the hospital.  He has increased furosemide on his own and has not had any improvement.  I will try to transition him to torsemide to see if this can effectively diurese him.  He will need close follow-up.  -DC furosemide  -Start torsemide 40 mg in the morning and 20 mg in the afternoon  -BMET, magnesium today  -BMET magnesium in 1 week  -Follow-up 1-2 weeks  2. Stage 3b chronic kidney disease ([place]) He will need close follow-up on his renal function and potassium given adjustments in diuretics.  See #1.  3. Coronary artery disease involving native coronary artery of native heart with angina pectoris ([place]) History of prior bare-metal stent to the LAD in 2014.  He had a non-STEMI in 12/21 that has been managed medically.  He has occasional chest pain.  This seems to be related to volume excess.  Proceed with diuresis as outlined above.  Continue conservative management.  Continue aspirin, atorvastatin, bisoprolol, isosorbide mononitrate.  4. NSVT  (nonsustained ventricular tachycardia) ([place])  Questionable episode of nonsustained ventricular tachycardia in the hospital.  As noted, he is DNR.  LV function is normal.  Continue beta-blocker.  Obtain BMET, magnesium today and again in 1 week as outlined above.  5. Pulmonary hypertension ([place]) Due to interstitial lung disease.  He is on chronic O2.  6. Essential hypertension The patient's blood pressure is controlled on his current regimen.  Continue current therapy.     Dispo:  Return in about 2 weeks (around 10/04/2020) for Close Follow Up with Dr. Domenic Polite or APP.   Medication Adjustments/Labs and Tests Ordered: Current medicines are reviewed at length with the patient today.  Concerns regarding medicines are outlined above.  Tests Ordered: Orders Placed This Encounter  Procedures  . Basic metabolic panel  . Magnesium  . Basic metabolic panel  . Magnesium   Medication Changes: Meds ordered this encounter  Medications  . torsemide (DEMADEX) 20 MG tablet    Sig: Take 40 mg am (2 tablets) and 20 mg pm (1 tablets)    Dispense:  270 tablet    Refill:  3    09/20/20 lasix stopped    Signed, [name], PA-C  09/20/2020 4:44 PM    [facility] [address], [place], [place]  [address] Phone: [phone]; Fax: [phone]

## 2020-09-20 ENCOUNTER — Other Ambulatory Visit: Payer: Self-pay

## 2020-09-20 ENCOUNTER — Ambulatory Visit (INDEPENDENT_AMBULATORY_CARE_PROVIDER_SITE_OTHER): Payer: No Typology Code available for payment source | Admitting: Physician Assistant

## 2020-09-20 ENCOUNTER — Encounter: Payer: Self-pay | Admitting: Physician Assistant

## 2020-09-20 VITALS — BP 120/60 | HR 75 | Ht 64.0 in | Wt 144.0 lb

## 2020-09-20 DIAGNOSIS — N1832 Chronic kidney disease, stage 3b: Secondary | ICD-10-CM | POA: Diagnosis not present

## 2020-09-20 DIAGNOSIS — I472 Ventricular tachycardia: Secondary | ICD-10-CM

## 2020-09-20 DIAGNOSIS — I10 Essential (primary) hypertension: Secondary | ICD-10-CM

## 2020-09-20 DIAGNOSIS — I4729 Other ventricular tachycardia: Secondary | ICD-10-CM

## 2020-09-20 DIAGNOSIS — I25119 Atherosclerotic heart disease of native coronary artery with unspecified angina pectoris: Secondary | ICD-10-CM | POA: Diagnosis not present

## 2020-09-20 DIAGNOSIS — I5033 Acute on chronic diastolic (congestive) heart failure: Secondary | ICD-10-CM | POA: Diagnosis not present

## 2020-09-20 DIAGNOSIS — Z79899 Other long term (current) drug therapy: Secondary | ICD-10-CM

## 2020-09-20 DIAGNOSIS — I5032 Chronic diastolic (congestive) heart failure: Secondary | ICD-10-CM

## 2020-09-20 DIAGNOSIS — I272 Pulmonary hypertension, unspecified: Secondary | ICD-10-CM

## 2020-09-20 MED ORDER — TORSEMIDE 20 MG PO TABS: ORAL_TABLET | ORAL | 3 refills | Status: DC

## 2020-09-20 NOTE — Patient Instructions (Signed)
Medication Instructions:  STOP Lasix    START Torsemide 20 mg tablet.  Take 40 mg am (2 tablets) and 20 mg pm  (1 tablet)  Daily.  *If you need a refill on your cardiac medications before your next appointment, please call your pharmacy*   Lab Work:  BMET, Magnesium TODAY   BMET, Magnesium in 2 weeks  (10/04/20)   If you have labs (blood work) drawn today and your tests are completely normal, you will receive your results only by: Marland Kitchen MyChart Message (if you have MyChart) OR . A paper copy in the mail If you have any lab test that is abnormal or we need to change your treatment, we will call you to review the results.   Testing/Procedures: none today    Follow-Up: At [facility], you and your health needs are our priority.  As part of our continuing mission to provide you with exceptional heart care, we have created designated Provider Care Teams.  These Care Teams include your primary Cardiologist (physician) and Advanced Practice Providers (APPs -  Physician Assistants and Nurse Practitioners) who all work together to provide you with the care you need, when you need it.  We recommend signing up for the patient portal called "MyChart".  Sign up information is provided on this After Visit Summary.  MyChart is used to connect with patients for Virtual Visits (Telemedicine).  Patients are able to view lab/test results, encounter notes, upcoming appointments, etc.  Non-urgent messages can be sent to your provider as well.   To learn more about what you can do with MyChart, go to [email or web address].    Your next appointment:   2 week(s)  The format for your next appointment:   In Person  Provider:   You may see Rozann Lesches, MD or one of the following Advanced Practice Providers on your designated Care Team:    Bernerd Pho, PA-C   Ermalinda Barrios, PA-C     Other Instructions None

## 2020-09-21 ENCOUNTER — Telehealth: Payer: Self-pay

## 2020-09-21 DIAGNOSIS — N185 Chronic kidney disease, stage 5: Secondary | ICD-10-CM

## 2020-09-21 DIAGNOSIS — I5033 Acute on chronic diastolic (congestive) heart failure: Secondary | ICD-10-CM

## 2020-09-21 LAB — BASIC METABOLIC PANEL
BUN/Creatinine Ratio: 21 (calc) (ref 6–22)
BUN: 47 mg/dL — ABNORMAL HIGH (ref 7–25)
CO2: 30 mmol/L (ref 20–32)
Calcium: 9 mg/dL (ref 8.6–10.3)
Chloride: 104 mmol/L (ref 98–110)
Creat: 2.28 mg/dL — ABNORMAL HIGH (ref 0.70–1.11)
Glucose, Bld: 139 mg/dL — ABNORMAL HIGH (ref 65–99)
Potassium: 4.6 mmol/L (ref 3.5–5.3)
Sodium: 143 mmol/L (ref 135–146)

## 2020-09-21 LAB — MAGNESIUM: Magnesium: 2 mg/dL (ref 1.5–2.5)

## 2020-09-21 NOTE — Telephone Encounter (Signed)
-----   Message from Liliane Shi, [place] sent at 09/21/2020 10:03 AM EDT ----- Creatinine increased.  K+ normal. At OV yesterday I changed furosemide to torsemide.  Plan was to get a BMET in 1 week.  Creatinine may be up due to significant volume overload.  Hopefully this will improve with diuresis.  He will need close f/u on renal function. PLAN:  1. Proceed with Torsemide as directed yesterday. 2. Please have pt come in Thursday (prefer) or Friday (the latest) to recheck BMET. Run stat if on Friday. 3. Keep f/u as planned 4. Have pt/family call if swelling or breathing gets worse. 5. Ask pt/family if they would be interested in palliative care consult - palliative care can assist family to help care for the pt at home as much as possible and try to avoid the hospital.  I think they may be able to help with blood draws.  If they are interested, please refer for palliative care consult.  [facility] has palliative med services and "serious illness care".  He would qualify for this as he has heart failure.   [name], PA-C    09/21/2020 10:01 AM

## 2020-09-21 NOTE — Telephone Encounter (Signed)
I spoke with daughter, karen. They would like referral placed for hospice, palliative care (done) .They will try to get his bmet repeated on Thursday 09/23/20. She will also inform other sister.

## 2020-09-21 NOTE — Telephone Encounter (Signed)
Attempt to leave message for daughter, Santiago Glad. Phone rang twice then went to voicemail which is fill, unable to leave message.

## 2020-10-11 ENCOUNTER — Telehealth: Payer: Self-pay

## 2020-10-11 NOTE — Telephone Encounter (Signed)
[facility] called on behalf of pt. Pt is requesting that [name] PA-C, be his attending while he is in Hospice care. Please advise.

## 2020-10-11 NOTE — Telephone Encounter (Signed)
I think it would be more appropriate for Dr. Domenic Polite or [place] to do this if they are willing.   [name], PA-C    10/11/2020 4:02 PM

## 2020-10-12 NOTE — Telephone Encounter (Signed)
Left a message for Cathren Laine with [facility] to call our office back in regards to pt.

## 2020-10-13 NOTE — Progress Notes (Signed)
Cardiology Office Note  Date: 10/13/2020   ID: [name], DOB Oct 30, 1921, MRN [id number]  PCP:  [facility] [place]  Cardiologist:  Rozann Lesches, MD Electrophysiologist:  None   Chief Complaint: 2 week follow up.  History of Present Illness: Anthony Galvan is a 87 y.o. male with a history of USAP /  NSTEMI, Pulmonary HTN, HTN, ILD, chronic respiratory failure, NSVT, Moderate to Severe TR, PE, HLD, CKD3, GERD,   He was last seen by Anthony Dopp PA 09/20/2020. Previous NSTEMI with AKI 12/21. Echo demonstrated preserved EF. Had TIA during that admission. He was in a wheelchair. He was on continuous 02. He breathing was worse recently. He was sleeping on incline. He had significant LE edema to thighs with scrotal edema. He was taking NTG SL for occasional chest pain. Chest pain was relieved with eating. His Lasix was discontinued. He was started on Torsemide 40 mg am and 20 mg afternoon. BMET and Mag were ordered for 1-2 weeks ( 10/04/2020 ). He was continuing aspirin, lipitor, bisoprolol, Imdur.   He is here for follow-up today with his daughter.  He has lost some weight per his daughter.  She states she has had recent weight was around 120 pounds which is down from 144 at previous visit on 09/20/2020.  His basic metabolic panel on [id number] demonstrated a creatinine 2.20 with no estimated GFR.  Previous creatinine on 08/24/2020 was 1.41.  At last visit his Lasix was stopped and he was placed on torsemide 40 mg a.m. and 20 mg in the afternoon.  He states his swelling is better.  His daughter states his scrotal edema has improved.  He has no shortness of breath.  He is very inactive.  His daughter states his appetite is poor.  She states he drinks approximately 3 Ensure drinks per day each are 240 cal.  She also states he has been having some diarrhea.  He was scheduled to have a repeat basic metabolic panel and magnesium on 10/04/2020.  Daughter states due to transportation issues she was not  able to get him to the lab.  She states she will have labs drawn on Wednesday, July 6.  Patient is sitting in a wheelchair on continuous O2.  He denies any current shortness of breath.  His O2 saturation on arrival was 95%.  Blood pressure was 120/70.  Heart rate was 74.  Patient was not able to weigh today due to severe debilitation.   Past Medical History:  Diagnosis Date   Bronchiectasis ([place]) 06/13/2018   CAD (coronary artery disease) 08/01/2012   BMS mid LAD; catheterization in July 2018 showed with severe heavily calcified distal circumflex-left PDA stenosis, not favorable for PCI.   Congestive heart failure (CHF) ([place])    Duodenal ulcer May 2011   on EGD   Essential hypertension    GERD (gastroesophageal reflux disease)    Helicobacter pylori gastritis 2012   Hyperlipidemia    ILD (interstitial lung disease) (HCC)    MI (myocardial infarction) ([place]) 1976, 1980, [place] [facility] at [facility] [place] (Dr. Iona Beard)   Osteoarthritis    Pulmonary embolism [facility])    Pulmonary hypertension ([place])    S/P colonoscopy 2011   normal, internal hemorrhoids    Past Surgical History:  Procedure Laterality Date   CARDIAC CATHETERIZATION  01/30/2007   D1 75 %, mid LAD 50%, 50-70% circumflex, 50-70% RCA.(Dr. Adora Fridge)   CARDIAC CATHETERIZATION  12/11/2001  normal L main, small RCA, dominant LL Cfx with mild diffuse disease, LAD with mid 10-20% stenosis, ramus intermedius with mild diffuse disease (Dr. Jackie Plum)   CATARACT EXTRACTION     COLONOSCOPY  OCT 2011 ARS   [facility]   in setting of MI   ESOPHAGOGASTRODUODENOSCOPY N/A 10/09/2016   Procedure: ESOPHAGOGASTRODUODENOSCOPY (EGD);  Surgeon: Danie Binder, MD;  [facility];  Service: Endoscopy;  Laterality: N/A;   FEMUR IM NAIL Right 12/01/2016   Procedure: INTRAMEDULLARY (IM) NAIL RIGHT HIP;  Surgeon: Altamese Pettus, MD;  Location: [place];  Service: Orthopedics;  Laterality: Right;    FEMUR IM NAIL Left 05/07/2018   Procedure: INTRAMEDULLARY (IM) NAIL FEMORAL;  Surgeon: Nicholes Stairs, MD;  Location: [place];  Service: Orthopedics;  Laterality: Left;   LEFT HEART CATH AND CORONARY ANGIOGRAPHY N/A 10/23/2016   Procedure: Left Heart Cath and Coronary Angiography;  Surgeon: Sherren Mocha, MD;  Location: [place] CV LAB;  Service: Cardiovascular: Two-vessel CAD (left dominant).  Patent BMS in p LAD.  Severe, heavily calcified dCx-LPDA lesion.  Not favorable for PCI.   LEFT HEART CATHETERIZATION WITH CORONARY ANGIOGRAM N/A 08/01/2012   Procedure: LEFT HEART CATHETERIZATION WITH CORONARY ANGIOGRAM;  Surgeon: Leonie Man, MD;  Location: [facility] CATH LAB: Mid LAD 99% apple core; D2 ostial 70-80% (2 small for PCI) small nondominant RCA 60-70%. Distal circumflex/L PDA ~50% --> PCI of LAD   NM MYOCAR PERF WALL MOTION  08/2003   adenosine stress - focal decreased perfusion defect in distal inferior wall, no significant ischemic changes   PERCUTANEOUS STENT INTERVENTION  08/01/2012   Procedure: PERCUTANEOUS STENT INTERVENTION;  Surgeon: Leonie Man, MD;  Location: [facility] CATH LAB; ;PCI of mid LAD with a VeriFlex Bare Metal Stent 3.0 mm x 12 mm - post-dilated to 3.5 mm.   TRANSTHORACIC ECHOCARDIOGRAM  10/2016   mild LVH - 60-65%. Gr 1 DD. No RWMA. Mild MR. Mod TR. Mild RA dilation.  PAP ~ 60 mmHg.   UPPER GASTROINTESTINAL ENDOSCOPY  08/2009   MELENA, HEMATEMESIS --> DUODENAL ULCER, Bx: H PYLORI POS    Current Outpatient Medications  Medication Sig Dispense Refill   acetaminophen (TYLENOL) 325 MG tablet Take 2 tablets (650 mg total) by mouth 3 (three) times daily as needed (pain). (Patient taking differently: Take 650 mg by mouth in the morning and at bedtime.)     alum & mag hydroxide-simeth (MAALOX/MYLANTA) 200-200-20 MG/5ML suspension Take 30 mLs by mouth every 4 (four) hours as needed for indigestion. 355 mL 0   aspirin EC 81 MG tablet Take 1 tablet (81 mg total) by mouth daily.  Swallow whole. 30 tablet 11   atorvastatin (LIPITOR) 80 MG tablet Take 1 tablet (80 mg total) by mouth daily at 6 PM. 30 tablet 5   bisoprolol (ZEBETA) 5 MG tablet Take 0.5 tablets (2.5 mg total) by mouth daily. 15 tablet 3   calcium carbonate (OS-CAL - DOSED IN MG OF ELEMENTAL CALCIUM) 1250 (500 Ca) MG tablet Take 1 tablet (500 mg of elemental calcium total) by mouth daily with breakfast.     dextromethorphan-guaiFENesin (ROBITUSSIN-DM) 10-100 MG/5ML liquid TAKE 2 TEASPOONSFUL BY MOUTH TWO TIMES A DAY AS NEEDED FOR COUGH     donepezil (ARICEPT) 10 MG tablet Take 10 mg by mouth daily.     ipratropium-albuterol (DUONEB) 0.5-2.5 (3) MG/3ML SOLN Take 3 mLs by nebulization 3 (three) times daily. 360 mL 2   isosorbide  mononitrate (IMDUR) 30 MG 24 hr tablet Take 3 tablets (90 mg total) by mouth daily. 90 tablet 3   metoprolol succinate (TOPROL-XL) 25 MG 24 hr tablet TAKE ONE-HALF TABLET BY MOUTH EVERY DAY FOR HEART AND BLOOD PRESSURE (Patient not taking: No sig reported)     mirtazapine (REMERON) 7.5 MG tablet TAKE ONE TABLET BY MOUTH AT BEDTIME FOR DEPRESSION     Multiple Vitamin (MULTIVITAMIN WITH MINERALS) TABS tablet Take 1 tablet by mouth daily.     NITROSTAT 0.4 MG SL tablet PLACE 1 TABLET UNDER TONGUE EVERY 5 MINUTES FOR 3 DOSES AS NEEDED. (Patient taking differently: Place 0.4 mg under the tongue every 5 (five) minutes as needed for chest pain.) 25 tablet 6   pantoprazole (PROTONIX) 40 MG tablet Take 1 tablet (40 mg total) by mouth daily. 30 tablet 1   polyethylene glycol (MIRALAX / GLYCOLAX) 17 g packet Take 17 g by mouth as needed.     torsemide (DEMADEX) 20 MG tablet Take 40 mg am (2 tablets) and 20 mg pm (1 tablets) 270 tablet 3   Vitamin D, Ergocalciferol, (DRISDOL) 1.25 MG (50000 UT) CAPS capsule Take 1 capsule (50,000 Units total) by mouth every 7 (seven) days. 5 capsule    Zinc Oxide 10 % OINT Apply 1 application topically daily as needed.     No current facility-administered medications  for this visit.   Allergies:  Lisinopril   Social History: The patient  reports that he has never smoked. He has never used smokeless tobacco. He reports that he does not drink alcohol and does not use drugs.   Family History: The patient's family history includes Heart disease in his mother and sister; Heart disease (age of onset: 109) in his brother; Kidney disease in his father.   ROS:  Please see the history of present illness. Otherwise, complete review of systems is positive for none.  All other systems are reviewed and negative.   Physical Exam: VS:  There were no vitals taken for this visit., BMI There is no height or weight on file to calculate BMI.  Wt Readings from Last 3 Encounters:  09/20/20 144 lb (65.3 kg)  08/24/20 144 lb 13.5 oz (65.7 kg)  08/24/20 144 lb 13.5 oz (65.7 kg)    General: Patient appears comfortable at rest. Neck: Supple, no elevated JVP or carotid bruits, no thyromegaly. Lungs: Clear to auscultation, nonlabored breathing at rest. Cardiac: Regular rate and rhythm, no S3 or significant systolic murmur, no pericardial rub. Extremities: No pitting edema, distal pulses 2+. Skin: Warm and dry. Musculoskeletal: No kyphosis. Neuropsychiatric: Alert and oriented x3, affect grossly appropriate.  ECG:    Recent Labwork: 08/19/2020: ALT 24; AST 36; B Natriuretic Peptide 962.0 08/22/2020: Hemoglobin 9.9; Platelets 127 09/20/2020: BUN 47; Creat 2.28; Magnesium 2.0; Potassium 4.6; Sodium 143     Component Value Date/Time   CHOL 102 08/23/2020 0427   TRIG 57 08/23/2020 0427   HDL 61 08/23/2020 0427   CHOLHDL 1.7 08/23/2020 0427   VLDL 11 08/23/2020 0427   LDLCALC 30 08/23/2020 0427    Other Studies Reviewed Today:  Prior CV studies:  Echocardiogram 08/19/20 EF 55-60, no RWMA, severe LVH, evidence of RV pressure and volume overload, low normal RVSF, moderate pulmonary hypertension, RVSP 50.5, moderate RAE, small pericardial effusion, trivial MR, mild AI    Carotid US 08/19/20 IMPRESSION: Minimal to moderate amount of bilateral atherosclerotic plaque, right greater than left, progressed compared to remote examination performed in 2005 though not resulting  in a hemodynamically significant stenosis within either internal carotid artery.   LEFT HEART CATH AND CORONARY ANGIOGRAPHY 10/23/2016 Narrative 1. Two-vessel coronary artery disease (left dominant) with patency of the stented segment in the proximal LAD and severe heavily calcified distal circumflex/left PDA stenosis unfavorable for PCI 2. Known normal LV function by noninvasive assessment 3. Normal LVEDP Recommend: Medical therapy for CAD. The distal circumflex/PDA lesion is severely calcified with a distal vessel location that is unfavorable for PCI and I would be very hesitant to use atherectomy in this distal location.      Assessment and Plan:  1. Chronic diastolic heart failure ([place])   2. CAD in native artery   3. Essential hypertension   4. Pulmonary hypertension ([place])   5. CKD (chronic kidney disease), stage V ([place])    1. Chronic diastolic heart failure (HCC) At last follow-up patient's Lasix was changed to torsemide 40 mg a.m. and 20 mg in the afternoon.  BMET on 09/20/2020 showed creatinine of 2.20.  Advised daughter to stop the evening/afternoon dose of torsemide for now until she is able to have repeat basic metabolic panel and magnesium done next Wednesday, July 6 per her plans.  Decrease torsemide to 40 mg a.m. only.  Continue bisoprolol 2.5 mg daily.  Continue Imdur 30 mg daily.  2. CAD in native artery Denies any current anginal or exertional symptoms.  Continue aspirin 81 mg daily.  Continue atorvastatin 80 mg daily.  Continue Imdur 30 mg daily.  Continue sublingual nitroglycerin as needed.  3. Essential hypertension Blood pressure well controlled at 120/70 today.  Continue bisoprolol 2.5 mg daily.  4. Pulmonary hypertension ([place]) Moderate pulmonary hypertension on  echo 08/19/2020.  RVSP 50.5 mmHg.  5. CKD (chronic kidney disease), stage V ([place]) Recent BMET with increased creatinine of 2.20.  We are decreasing torsemide to 40 mg daily only pending labs  Medication Adjustments/Labs and Tests Ordered: Current medicines are reviewed at length with the patient today.  Concerns regarding medicines are outlined above.   Disposition: Follow-up with Dr. Domenic Polite or APP 1 month  Signed, Levell July, NP 10/13/2020 8:14 PM    [facility] at [place], [place], [place] [address] Phone: [phone]; Fax: [phone]

## 2020-10-13 NOTE — Telephone Encounter (Signed)
Spoke to pt's nurse at hospice and relayed the information from Dr. Domenic Polite. Nurse stated that they were trying to get in touch with the [facility] physician to take over care. I informed nurse that if there were cardiac specific questions that needed answers, Dr. Domenic Polite would be agreeable to answer them, but questions only. She verbalized agreement.

## 2020-10-14 ENCOUNTER — Encounter: Payer: Self-pay | Admitting: Family Medicine

## 2020-10-14 ENCOUNTER — Ambulatory Visit: Payer: Medicare Other | Admitting: Family Medicine

## 2020-10-14 VITALS — BP 120/70 | HR 74 | Ht 64.0 in

## 2020-10-14 DIAGNOSIS — N185 Chronic kidney disease, stage 5: Secondary | ICD-10-CM

## 2020-10-14 DIAGNOSIS — I10 Essential (primary) hypertension: Secondary | ICD-10-CM | POA: Diagnosis not present

## 2020-10-14 DIAGNOSIS — I2510 Atherosclerotic heart disease of native coronary artery without angina pectoris: Secondary | ICD-10-CM | POA: Diagnosis not present

## 2020-10-14 DIAGNOSIS — I5032 Chronic diastolic (congestive) heart failure: Secondary | ICD-10-CM

## 2020-10-14 DIAGNOSIS — I272 Pulmonary hypertension, unspecified: Secondary | ICD-10-CM | POA: Diagnosis not present

## 2020-10-14 MED ORDER — ATORVASTATIN CALCIUM 80 MG PO TABS: 80.0000 mg | ORAL_TABLET | Freq: Every day | ORAL | 1 refills | Status: AC

## 2020-10-14 MED ORDER — TORSEMIDE 20 MG PO TABS: 40.0000 mg | ORAL_TABLET | Freq: Every day | ORAL | 1 refills | Status: AC

## 2020-10-14 NOTE — Patient Instructions (Signed)
Medication Instructions:  Your physician has recommended you make the following change in your medication:  Decrease torsemide to 40 mg in the morning Continue other medications the same  Labwork: Get lab work done asap  Testing/Procedures: none  Follow-Up: Your physician recommends that you schedule a follow-up appointment in: 1 month  Any Other Special Instructions Will Be Listed Below (If Applicable).  If you need a refill on your cardiac medications before your next appointment, please call your pharmacy.

## 2020-10-21 ENCOUNTER — Telehealth: Payer: Self-pay | Admitting: Physician Assistant

## 2020-10-21 NOTE — Telephone Encounter (Signed)
Anthony Galvan @ [facility] ph# [phone].Marland KitchenMarland KitchenShe states they needs another Hospice Order because it has been over 14 Days and the one they have is no longer valid. They also need to know the Hospice Attending Status - will [name] be following his care?  This info can be faxed [phone]

## 2020-10-21 NOTE — Telephone Encounter (Signed)
I spoke with [facility] a few weeks ago after I had a chance to speak with [name], PA-C on the matter. See attachments below:  I think it would be more appropriate for Dr. Domenic Polite or [place] to do this if they are willing.   [name], PA-C    10/11/2020 4:02 PM    From SM: I reviewed the chart.  It looks like patient was referred for palliative care/hospice consultation by Mr. Kathlen Mody PA-C earlier this month.  Typically, these types of services have an attending through their program that manages the hospice care, and I would think that this would be in place already.  If there are specific cardiac questions, oftentimes these providers will contactus for guidance.    Genia Del,CMA:  Spoke to pt's nurse at hospice and relayed the information from Dr. Domenic Polite. Nurse stated that they were trying to get in touch with the [facility] physician to take over care. I informed nurse that if there were cardiac specific questions that needed answers, Dr. Domenic Polite would be agreeable to answer them, but questions only. She verbalized agreement.

## 2020-10-21 NOTE — Telephone Encounter (Signed)
Daughter called again [facility] needs order for lab work and a PPD and skilled wound care order

## 2020-10-25 ENCOUNTER — Telehealth: Payer: Self-pay | Admitting: Student

## 2020-10-25 DIAGNOSIS — Z79899 Other long term (current) drug therapy: Secondary | ICD-10-CM

## 2020-10-25 NOTE — Telephone Encounter (Signed)
New message    They need a Hospice referral the one they had expired , they are only good for 14 days  [phone]  fax

## 2020-10-26 NOTE — Telephone Encounter (Signed)
Ambulatory referral for palliative care - [facility]- placed for lab work only

## 2020-10-26 NOTE — Telephone Encounter (Signed)
Palliative care is fine. [name], PA-C    10/26/2020 12:59 PM

## 2020-10-26 NOTE — Telephone Encounter (Signed)
I spoke to [facility]. She stated that if all we needed were labs, there referral should've gone through [employer]. Tammy also states that they have not seen pt as of yet. She stated that order for care was placed by SW on 09/24/20, they contacted pt's daughter on 09/29/20 to establish care, to which daughter wanted to wait and see what the [facility] was planning. On 6/29, daughter contacted [facility] and said she wanted to use the referral that SW had entered on the 10th to which she was notified had expired. Tammy is aware that we will not be resending a hospice order. Tammy stated if [facility] just needs lab work, we can put in referral to palliative. Please advise.

## 2020-10-26 NOTE — Telephone Encounter (Signed)
I saw him one time when I filled in for [place] while she was off.  He needed labs drawn and his daughter was having a hard time getting him to appointments.  I believe the only thing we could find to help was [facility].    If this renewal request is so we can get f/u labs that are needed, then we can renew for that purpose.  If he needs a hospice referral for all end of life needs, he should get that through primary care. [name], PA-C    10/26/2020 10:55 AM

## 2020-10-29 ENCOUNTER — Telehealth: Payer: Self-pay

## 2020-10-29 NOTE — Telephone Encounter (Signed)
412 pm.  Phone call made to daughter Santiago Glad to follow up on a Palliative Care referral.  No answer.  Message has been left requesting a callback.

## 2020-11-01 ENCOUNTER — Telehealth: Payer: Self-pay

## 2020-11-01 ENCOUNTER — Emergency Department (HOSPITAL_COMMUNITY)
Admission: EM | Admit: 2020-11-01 | Discharge: 2020-11-01 | Disposition: A | Discharge status: Home / Self Care | Payer: No Typology Code available for payment source | Attending: Emergency Medicine | Admitting: Emergency Medicine

## 2020-11-01 ENCOUNTER — Other Ambulatory Visit: Payer: Self-pay

## 2020-11-01 ENCOUNTER — Encounter (HOSPITAL_COMMUNITY): Payer: Self-pay

## 2020-11-01 ENCOUNTER — Emergency Department (HOSPITAL_COMMUNITY): Payer: No Typology Code available for payment source

## 2020-11-01 DIAGNOSIS — I25110 Atherosclerotic heart disease of native coronary artery with unstable angina pectoris: Secondary | ICD-10-CM | POA: Diagnosis not present

## 2020-11-01 DIAGNOSIS — R531 Weakness: Secondary | ICD-10-CM | POA: Insufficient documentation

## 2020-11-01 DIAGNOSIS — Z7982 Long term (current) use of aspirin: Secondary | ICD-10-CM | POA: Insufficient documentation

## 2020-11-01 DIAGNOSIS — T1490XA Injury, unspecified, initial encounter: Secondary | ICD-10-CM | POA: Diagnosis not present

## 2020-11-01 DIAGNOSIS — Z79899 Other long term (current) drug therapy: Secondary | ICD-10-CM | POA: Insufficient documentation

## 2020-11-01 DIAGNOSIS — F0150 Vascular dementia without behavioral disturbance: Secondary | ICD-10-CM | POA: Diagnosis not present

## 2020-11-01 DIAGNOSIS — R404 Transient alteration of awareness: Secondary | ICD-10-CM | POA: Diagnosis not present

## 2020-11-01 DIAGNOSIS — Z743 Need for continuous supervision: Secondary | ICD-10-CM | POA: Diagnosis not present

## 2020-11-01 DIAGNOSIS — I132 Hypertensive heart and chronic kidney disease with heart failure and with stage 5 chronic kidney disease, or end stage renal disease: Secondary | ICD-10-CM | POA: Diagnosis not present

## 2020-11-01 DIAGNOSIS — I5033 Acute on chronic diastolic (congestive) heart failure: Secondary | ICD-10-CM | POA: Diagnosis not present

## 2020-11-01 DIAGNOSIS — N185 Chronic kidney disease, stage 5: Secondary | ICD-10-CM | POA: Diagnosis not present

## 2020-11-01 DIAGNOSIS — R6889 Other general symptoms and signs: Secondary | ICD-10-CM | POA: Diagnosis not present

## 2020-11-01 DIAGNOSIS — R410 Disorientation, unspecified: Secondary | ICD-10-CM | POA: Diagnosis not present

## 2020-11-01 LAB — CBC WITH DIFFERENTIAL/PLATELET
Abs Immature Granulocytes: 0.02 10*3/uL (ref 0.00–0.07)
Basophils Absolute: 0.1 10*3/uL (ref 0.0–0.1)
Basophils Relative: 1 %
Eosinophils Absolute: 0.1 10*3/uL (ref 0.0–0.5)
Eosinophils Relative: 1 %
HCT: 41.3 % (ref 39.0–52.0)
Hemoglobin: 12.3 g/dL — ABNORMAL LOW (ref 13.0–17.0)
Immature Granulocytes: 0 %
Lymphocytes Relative: 23 %
Lymphs Abs: 1.9 10*3/uL (ref 0.7–4.0)
MCH: 29.4 pg (ref 26.0–34.0)
MCHC: 29.8 g/dL — ABNORMAL LOW (ref 30.0–36.0)
MCV: 98.8 fL (ref 80.0–100.0)
Monocytes Absolute: 0.8 10*3/uL (ref 0.1–1.0)
Monocytes Relative: 10 %
Neutro Abs: 5.2 10*3/uL (ref 1.7–7.7)
Neutrophils Relative %: 65 %
Platelets: 161 10*3/uL (ref 150–400)
RBC: 4.18 MIL/uL — ABNORMAL LOW (ref 4.22–5.81)
RDW: 15.4 % (ref 11.5–15.5)
WBC: 8.1 10*3/uL (ref 4.0–10.5)
nRBC: 0 % (ref 0.0–0.2)

## 2020-11-01 LAB — BASIC METABOLIC PANEL
Anion gap: 10 (ref 5–15)
BUN: 47 mg/dL — ABNORMAL HIGH (ref 8–23)
CO2: 27 mmol/L (ref 22–32)
Calcium: 8.7 mg/dL — ABNORMAL LOW (ref 8.9–10.3)
Chloride: 101 mmol/L (ref 98–111)
Creatinine, Ser: 1.93 mg/dL — ABNORMAL HIGH (ref 0.61–1.24)
GFR, Estimated: 31 mL/min — ABNORMAL LOW (ref >60)
Glucose, Bld: 90 mg/dL (ref 70–99)
Potassium: 3.9 mmol/L (ref 3.5–5.1)
Sodium: 138 mmol/L (ref 135–145)

## 2020-11-01 MED ORDER — DONEPEZIL HCL 10 MG PO TABS: 10.0000 mg | ORAL_TABLET | Freq: Every day | ORAL | 0 refills | Status: DC

## 2020-11-01 MED ORDER — TUBERCULIN PPD 5 UNIT/0.1ML ID SOLN
5.0000 [IU] | INTRADERMAL | Status: DC
  Administered 2020-11-01: 5 [IU] via INTRADERMAL
  Filled 2020-11-01: qty 0.1

## 2020-11-01 MED ORDER — HYDROXYZINE HCL 25 MG PO TABS: ORAL_TABLET | ORAL | 0 refills | Status: AC

## 2020-11-01 MED ORDER — DONEPEZIL HCL 10 MG PO TABS: 10.0000 mg | ORAL_TABLET | Freq: Every day | ORAL | 0 refills | Status: AC

## 2020-11-01 MED ORDER — HYDROXYZINE HCL 25 MG PO TABS
ORAL_TABLET | ORAL | 0 refills | Status: DC
Start: 2020-11-01 — End: 2020-11-01

## 2020-11-01 NOTE — Telephone Encounter (Signed)
1022 am.  Message received from daughter Santiago Glad on Saturday.  Return call made today.  No answer.  Message has been left requesting a call back.
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
## 2020-11-01 NOTE — Discharge Instructions (Addendum)
Follow-up with your family doctor for recheck.  You need to get the TB studies checked in 2 or 3 days
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
## 2020-11-01 NOTE — ED Provider Notes (Addendum)
[facility] Provider Note   CSN: [id number] Arrival date & time: 11/01/20  1039     History No chief complaint on file.   Anthony Galvan is a 87 y.o. male.  Patient brought in for not sleeping because he has not been taking his Aricept.  It was lost.  His daughter also wants a TB test done so he can be placed in assisting living  The history is provided by the patient and medical records. No language interpreter was used.  Weakness Severity:  Mild Onset quality:  Gradual Duration:  2 days Timing:  Constant Progression:  Waxing and waning Chronicity:  New Context: not alcohol use   Relieved by:  Nothing Worsened by:  Nothing Associated symptoms: no abdominal pain, no chest pain, no cough, no diarrhea, no frequency, no headaches and no seizures       Past Medical History:  Diagnosis Date   Bronchiectasis ([place]) 06/13/2018   CAD (coronary artery disease) 08/01/2012   BMS mid LAD; catheterization in July 2018 showed with severe heavily calcified distal circumflex-left PDA stenosis, not favorable for PCI.   Congestive heart failure (CHF) ([place])    Duodenal ulcer May 2011   on EGD   Essential hypertension    GERD (gastroesophageal reflux disease)    Helicobacter pylori gastritis 2012   Hyperlipidemia    ILD (interstitial lung disease) (HCC)    MI (myocardial infarction) ([place]) 1976, 1980, 1996   Cared for at [facility] and [place] (Dr. Iona Beard)   Osteoarthritis    Pulmonary embolism [facility])    Pulmonary hypertension ([place])    S/P colonoscopy 2011   normal, internal hemorrhoids    Patient Active Problem List   Diagnosis Date Noted   Pressure injury of skin 08/24/2020   Fall at home, initial encounter 08/19/2020   Syncope 08/19/2020   Macrocytic anemia 08/19/2020   Symptomatic anemia 08/19/2020   Elevated d-dimer 08/19/2020   (HFpEF) heart failure with preserved ejection fraction ([place]) 08/19/2020   Acute CHF ([place]) 08/19/2020   Chest pain  08/18/2020   Hyperbilirubinemia 04/13/2020   CKD (chronic kidney disease), stage V ([place]) 04/13/2020   NSTEMI (non-ST elevated myocardial infarction) ([place]) 04/13/2020   Acute coronary syndrome with high troponin ([place]) 04/12/2020   Constipation 05/29/2019   Acute on chronic respiratory failure (HCC)    Cor pulmonale (chronic) ([place]) 01/26/2019   Chronic respiratory failure with hypoxia ([place]) 01/25/2019   Bronchiectasis ([place]) 01/25/2019   Acute CHF (congestive heart failure) ([place]) 01/24/2019   Normocytic anemia 01/24/2019   Thrombocytopenia ([place]) 01/24/2019   Interstitial lung disease ([place]) 06/15/2018   Pulmonary hypertension ([place]) 06/15/2018   Acute pulmonary embolus ([place]) 06/14/2018   Acute on chronic diastolic CHF (congestive heart failure) ([place]) 06/12/2018   Acute respiratory failure with hypoxia ([place]) 06/12/2018   Fracture 05/07/2018   Closed displaced intertrochanteric fracture of left femur ([place])    Coronary artery disease involving native heart without angina pectoris    Dyspepsia 01/18/2017   Closed disp intertrochanteric fracture of right femur with nonunion 11/30/2016   PUD (peptic ulcer disease)    History of coronary artery stent placement 10/08/2016   Acute on chronic renal failure ([place]) 10/08/2016   CKD (chronic kidney disease) stage 3, GFR 30-59 ml/min (HCC) 10/08/2016   Bloating 09/03/2013   CAD S/P percutaneous coronary angioplasty    MI (myocardial infarction) ([place])    Presence of bare metal stent in LAD coronary artery - VerifFlex BMS 3.0 mm  x 12 mm - post-dilated to 3.5 mm 08/01/2012   Unstable angina (HCC) 07/30/2012   Gastritis 12/15/2010   Nausea 08/30/2010   Hyperlipidemia with target low density lipoprotein (LDL) cholesterol less than 70 mg/dL 05/21/2006   CATARACT NOS 05/21/2006   Essential hypertension 05/21/2006   MYOCARDIAL INFARCTION, HX OF 05/21/2006   GERD 05/21/2006   OVERACTIVE BLADDER 05/21/2006   OSTEOARTHRITIS 05/21/2006   URINARY  INCONTINENCE 05/21/2006    Past Surgical History:  Procedure Laterality Date   CARDIAC CATHETERIZATION  01/30/2007   D1 75 %, mid LAD 50%, 50-70% circumflex, 50-70% RCA.(Dr. Adora Fridge)   CARDIAC CATHETERIZATION  12/11/2001   normal L main, small RCA, dominant LL Cfx with mild diffuse disease, LAD with mid 10-20% stenosis, ramus intermedius with mild diffuse disease (Dr. Jackie Plum)   CATARACT EXTRACTION     COLONOSCOPY  OCT 2011 ARS   [facility]   in setting of MI   ESOPHAGOGASTRODUODENOSCOPY N/A 10/09/2016   Procedure: ESOPHAGOGASTRODUODENOSCOPY (EGD);  Surgeon: Danie Binder, MD;  [facility];  Service: Endoscopy;  Laterality: N/A;   FEMUR IM NAIL Right 12/01/2016   Procedure: INTRAMEDULLARY (IM) NAIL RIGHT HIP;  Surgeon: Altamese Worthington, MD;  Location: [facility];  Service: Orthopedics;  Laterality: Right;   FEMUR IM NAIL Left 05/07/2018   Procedure: INTRAMEDULLARY (IM) NAIL FEMORAL;  Surgeon: Nicholes Stairs, MD;  Location: [facility];  Service: Orthopedics;  Laterality: Left;   LEFT HEART CATH AND CORONARY ANGIOGRAPHY N/A 10/23/2016   Procedure: Left Heart Cath and Coronary Angiography;  Surgeon: Sherren Mocha, MD;  Location: [facility] CV LAB;  Service: Cardiovascular: Two-vessel CAD (left dominant).  Patent BMS in p LAD.  Severe, heavily calcified dCx-LPDA lesion.  Not favorable for PCI.   LEFT HEART CATHETERIZATION WITH CORONARY ANGIOGRAM N/A 08/01/2012   Procedure: LEFT HEART CATHETERIZATION WITH CORONARY ANGIOGRAM;  Surgeon: Leonie Man, MD;  Location: [facility] CATH LAB: Mid LAD 99% apple core; D2 ostial 70-80% (2 small for PCI) small nondominant RCA 60-70%. Distal circumflex/L PDA ~50% --> PCI of LAD   NM MYOCAR PERF WALL MOTION  08/2003   adenosine stress - focal decreased perfusion defect in distal inferior wall, no significant ischemic changes   PERCUTANEOUS STENT INTERVENTION  08/01/2012   Procedure: PERCUTANEOUS STENT INTERVENTION;  Surgeon:  Leonie Man, MD;  Location: [facility] CATH LAB; ;PCI of mid LAD with a VeriFlex Bare Metal Stent 3.0 mm x 12 mm - post-dilated to 3.5 mm.   TRANSTHORACIC ECHOCARDIOGRAM  10/2016   mild LVH - 60-65%. Gr 1 DD. No RWMA. Mild MR. Mod TR. Mild RA dilation.  PAP ~ 60 mmHg.   UPPER GASTROINTESTINAL ENDOSCOPY  08/2009   MELENA, HEMATEMESIS --> DUODENAL ULCER, Bx: H PYLORI POS       Family History  Problem Relation Age of Onset   Heart disease Mother    Kidney disease Father    Heart disease Brother 49   Heart disease Sister    Colon cancer Neg Hx     Social History   Tobacco Use   Smoking status: Never   Smokeless tobacco: Never  Vaping Use   Vaping Use: Never used  Substance Use Topics   Alcohol use: No   Drug use: No    Home Medications Prior to Admission medications   Medication Sig Start Date End Date Taking? Authorizing Provider  donepezil (ARICEPT) 10 MG tablet Take 1 tablet (10 mg total)  by mouth at bedtime. 11/01/20  Yes Milton Ferguson, MD  hydrOXYzine (ATARAX/VISTARIL) 25 MG tablet Take 1 at bedtime if needed for sleep 11/01/20  Yes Milton Ferguson, MD  acetaminophen (TYLENOL) 325 MG tablet Take 2 tablets (650 mg total) by mouth 3 (three) times daily as needed (pain). Patient taking differently: Take 650 mg by mouth in the morning and at bedtime. 12/04/16   Ainsley Spinner, PA-C  alum & mag hydroxide-simeth (MAALOX/MYLANTA) 200-200-20 MG/5ML suspension Take 30 mLs by mouth every 4 (four) hours as needed for indigestion. 01/29/19   Johnson, Clanford L, MD  aspirin EC 81 MG tablet Take 1 tablet (81 mg total) by mouth daily. Swallow whole. 10/01/19   Strader, Fransisco Hertz, PA-C  atorvastatin (LIPITOR) 80 MG tablet Take 1 tablet (80 mg total) by mouth daily at 6 PM. 10/14/20   Verta Ellen., NP  bisoprolol (ZEBETA) 5 MG tablet Take 2.5 mg by mouth daily.    [provider]  calcium carbonate (OS-CAL - DOSED IN MG OF ELEMENTAL CALCIUM) 1250 (500 Ca) MG tablet Take 1 tablet (500  mg of elemental calcium total) by mouth daily with breakfast. 05/13/18   Mullis, Kiersten P, DO  dextromethorphan-guaiFENesin (ROBITUSSIN-DM) 10-100 MG/5ML liquid TAKE 2 TEASPOONSFUL BY MOUTH TWO TIMES A DAY AS NEEDED FOR COUGH 12/17/19   [provider]  ipratropium-albuterol (DUONEB) 0.5-2.5 (3) MG/3ML SOLN Take 3 mLs by nebulization 3 (three) times daily. 01/29/19   Johnson, Clanford L, MD  isosorbide mononitrate (IMDUR) 30 MG 24 hr tablet Take 90 mg by mouth daily.    [provider]  mirtazapine (REMERON) 7.5 MG tablet TAKE ONE TABLET BY MOUTH AT BEDTIME FOR DEPRESSION 06/25/20   [provider]  Multiple Vitamin (MULTIVITAMIN WITH MINERALS) TABS tablet Take 1 tablet by mouth daily.    [provider]  NITROSTAT 0.4 MG SL tablet PLACE 1 TABLET UNDER TONGUE EVERY 5 MINUTES FOR 3 DOSES AS NEEDED. Patient taking differently: Place 0.4 mg under the tongue every 5 (five) minutes as needed for chest pain. 11/17/13   Leonie Man, MD  pantoprazole (PROTONIX) 40 MG tablet Take 1 tablet (40 mg total) by mouth daily. 01/30/19   Johnson, Clanford L, MD  polyethylene glycol (MIRALAX / GLYCOLAX) 17 g packet Take 17 g by mouth as needed. 04/19/20   Geradine Girt, DO  torsemide (DEMADEX) 20 MG tablet Take 2 tablets (40 mg total) by mouth daily. 10/14/20   Verta Ellen., NP  Vitamin D, Ergocalciferol, (DRISDOL) 1.25 MG (50000 UT) CAPS capsule Take 1 capsule (50,000 Units total) by mouth every 7 (seven) days. 05/16/18   Mullis, Kiersten P, DO  Zinc Oxide 10 % OINT Apply 1 application topically daily as needed.    [provider]    Allergies    Lisinopril  Review of Systems   Review of Systems  Constitutional:  Negative for appetite change and fatigue.  HENT:  Negative for congestion, ear discharge and sinus pressure.   Eyes:  Negative for discharge.  Respiratory:  Negative for cough.   Cardiovascular:  Negative for chest pain.  Gastrointestinal:  Negative  for abdominal pain and diarrhea.  Genitourinary:  Negative for frequency and hematuria.  Musculoskeletal:  Negative for back pain.  Skin:  Negative for rash.  Neurological:  Positive for weakness. Negative for seizures and headaches.  Psychiatric/Behavioral:  Negative for hallucinations.    Physical Exam Updated Vital Signs BP (!) 125/93   Pulse 69  Temp 98 F (36.7 C) (Oral)   Resp 20   Ht '5\' 4"'$  (1.626 m)   Wt 63.5 kg   SpO2 100%   BMI 24.03 kg/m   Physical Exam Vitals and nursing note reviewed.  Constitutional:      Appearance: He is well-developed.  HENT:     Head: Normocephalic.  Eyes:     General: No scleral icterus.    Conjunctiva/sclera: Conjunctivae normal.  Neck:     Thyroid: No thyromegaly.  Cardiovascular:     Rate and Rhythm: Normal rate and regular rhythm.     Heart sounds: No murmur heard.   No friction rub. No gallop.  Pulmonary:     Breath sounds: No stridor. No wheezing or rales.  Chest:     Chest wall: No tenderness.  Abdominal:     General: There is no distension.     Tenderness: There is no abdominal tenderness. There is no rebound.  Musculoskeletal:        General: Normal range of motion.     Cervical back: Neck supple.  Lymphadenopathy:     Cervical: No cervical adenopathy.  Skin:    Findings: No erythema or rash.  Neurological:     Mental Status: He is alert.     Motor: No abnormal muscle tone.     Coordination: Coordination normal.     Comments: Patient mildly confused.  Patient oriented to person place  Psychiatric:        Behavior: Behavior normal.    ED Results / Procedures / Treatments   Labs (all labs ordered are listed, but only abnormal results are displayed) Labs Reviewed  CBC WITH DIFFERENTIAL/PLATELET - Abnormal; Notable for the following components:      Result Value   RBC 4.18 (*)    Hemoglobin 12.3 (*)    MCHC 29.8 (*)    All other components within normal limits  BASIC METABOLIC PANEL - Abnormal; Notable for  the following components:   BUN 47 (*)    Creatinine, Ser 1.93 (*)    Calcium 8.7 (*)    GFR, Estimated 31 (*)    All other components within normal limits    EKG None  Radiology DG Ankle Complete Right  Result Date: 11/01/2020 CLINICAL DATA:  Fall. EXAM: RIGHT ANKLE - COMPLETE 3+ VIEW COMPARISON:  None. FINDINGS: Age indeterminate mildly displaced fracture of the medial malleolus, best seen on the oblique radiograph. No joint malalignment. Unremarkable soft tissues. Osteopenia. Atherosclerotic vascular calcifications. IMPRESSION: 1. Age indeterminate, mildly displaced fracture of the medial malleolus. Recommend correlation with the presence or absence of point tenderness. 2. No joint malalignment. 3. Osteopenia. Electronically Signed   By: Margaretha [place] MD   On: 11/01/2020 12:55   CT Head Wo Contrast  Result Date: 11/01/2020 CLINICAL DATA:  Pt daughter states she fell asleep for 5 minutes and he slid in the floor EXAM: CT HEAD WITHOUT CONTRAST TECHNIQUE: Contiguous axial images were obtained from the base of the skull through the vertex without intravenous contrast. COMPARISON:  08/22/2020 FINDINGS: Brain: No evidence of acute infarction, hemorrhage, extra-axial collection, ventriculomegaly, or mass effect. Generalized cerebral atrophy. Periventricular white matter low attenuation likely secondary to microangiopathy. Vascular: Cerebrovascular atherosclerotic calcifications are noted. Skull: Negative for fracture or focal lesion. Sinuses/Orbits: Visualized portions of the orbits are unremarkable. Visualized portions of the paranasal sinuses are unremarkable. Visualized portions of the mastoid air cells are unremarkable. Other: None. IMPRESSION: 1. No acute intracranial pathology. 2. Chronic microvascular disease and  cerebral atrophy. Electronically Signed   By: Kathreen Devoid   On: 11/01/2020 13:15    Procedures Procedures   Medications Ordered in ED Medications  tuberculin injection 5  Units (has no administration in time range)    ED Course  I have reviewed the triage vital signs and the nursing notes.  Pertinent labs & imaging results that were available during my care of the patient were reviewed by me and considered in my medical decision making (see chart for details).    MDM Rules/Calculators/A&P                          Labs unremarkable.  Patient will be given his Aricept and Vistaril if needed for sleep and a TB test has been placed.  He will follow-up with his PCP Final Clinical Impression(s) / ED Diagnoses Final diagnoses:  Vascular dementia without behavioral disturbance ([place])    Rx / DC Orders ED Discharge Orders          Ordered    donepezil (ARICEPT) 10 MG tablet  Daily at bedtime        11/01/20 1520    hydrOXYzine (ATARAX/VISTARIL) 25 MG tablet        11/01/20 1520             Milton Ferguson, MD 11/04/20 1020    Milton Ferguson, MD 11/04/20 1022

## 2020-11-01 NOTE — ED Notes (Signed)
Pt cleaned up and changed ready to go.

## 2020-11-01 NOTE — ED Notes (Signed)
Medications changed to [place] per daughter request.

## 2020-11-01 NOTE — ED Notes (Signed)
Went into room due to alarms going off. Patient had removed his oxygen, his pulse ox and his ekg pads. Patient placed back on 4 liters oxygen and equipment re-applied.

## 2020-11-03 ENCOUNTER — Telehealth: Payer: Self-pay

## 2020-11-03 NOTE — Telephone Encounter (Signed)
139 pm.  Request received to return call to [place].  Call back made but no answer.  Message has been left.  257 pm.  Phone call made to Karen-daughter on her cell phone.  No answer.  Message has been left requesting a call back.  Uncertain if PPD needs to be read by Palliative Care RN.  Awaiting call back from daughter.

## 2020-11-04 ENCOUNTER — Other Ambulatory Visit: Payer: Self-pay

## 2020-11-04 ENCOUNTER — Other Ambulatory Visit: Payer: Medicare Other

## 2020-11-04 ENCOUNTER — Other Ambulatory Visit: Payer: Medicare Other | Admitting: *Deleted

## 2020-11-04 ENCOUNTER — Telehealth: Payer: Self-pay

## 2020-11-04 VITALS — BP 113/61 | HR 66 | Temp 97.6°F | Resp 16

## 2020-11-04 DIAGNOSIS — Z515 Encounter for palliative care: Secondary | ICD-10-CM

## 2020-11-04 NOTE — Progress Notes (Signed)
COMMUNITY PALLIATIVE CARE SW NOTE  PATIENT NAME: Anthony Galvan DOB: [birth year] MRN: [id number]  PRIMARY CARE PROVIDER: [facility], [place]  RESPONSIBLE PARTY:  Acct ID - Guarantor Home Phone Work Phone Relationship Acct Type  [id number] [name], [name]  Self P/F     [place], [place], [place] [address]     PLAN OF CARE and INTERVENTIONS:             GOALS OF CARE/ ADVANCE CARE PLANNING:  Goal is for patient to remain in his home. Patient is a DNR.  SOCIAL/EMOTIONAL/SPIRITUAL ASSESSMENT/ INTERVENTIONS:  SW and RN- M. Howard completed a follow-up/initial visit with patient at his home. He was present in his hospital bed, wearing his o2. Patient was awake, alert, cordial and engaged during this visit. He denied pain. However patient appeared to be weak. His daughter was present with him. The RN read patient's PPD and completed vitals. Patient engaged in life review, sharing stories about his experience in [place] and then returning home. His daughter advised that patient is no longer able to walk and is a three-person assist. He is on 4L of o2 continuous. Patient is total care except for feeding. She stated that he declined since his hospitalization. She is working with the [facility] to get patient into a respite for a few weeks  while they do some repairs to his home. She is not sure when this would happen, but hopes it will move along now that his PPD is completed. The team reinforced contact information and when to call for assistance. Patient and his daughter were both open to ongoing support and visits by the palliative care team.  PATIENT/CAREGIVER EDUCATION/ COPING:  Patient and his daughter appear to be coping well.  PERSONAL EMERGENCY PLAN:  911 can be activated for emergencies. COMMUNITY RESOURCES COORDINATION/ HEALTH CARE NAVIGATION:  Patient receives services through the [place].  FINANCIAL/LEGAL CONCERNS/INTERVENTIONS:  None.     SOCIAL HX:  Social History   Tobacco Use    Smoking status: Never   Smokeless tobacco: Never  Substance Use Topics   Alcohol use: No    CODE STATUS: DNR ADVANCED DIRECTIVES: No MOST FORM COMPLETE:  No HOSPICE EDUCATION PROVIDED: No  PPS: Patient is alert and oriented x3, enjoys socialization and life review. Patient is on 4L of continuous o2.  Duration of visit and documentation: 60 minutes.    [address] [place], [place]

## 2020-11-04 NOTE — Telephone Encounter (Signed)
11/03/2020 4:22 pm. (Late Entry)  Incoming call from [place].  She would like a nurse to see her father tomorrow to read the PPD results.  Santiago Glad notes documentation from ED showing PPD was given but does not have a form to document results.  I will see if PC has a form for PPD documentation.  Spoke to daughter at length about the differences between Palliative Care and Hospice Services through [facility].  Daughter confirms her desire for Palliative Care.  Verbal consent received to start services. Advised and RN would see patient tomorrow around 1130 am to read results.   Daughter confirms [facility] and family regarding [facility] stay.  No time frame for this is given as of today.  Patient has received his aricept and daughter states symptoms are better managed and patient is resting better.  No other concerns voiced at this time.

## 2020-11-05 ENCOUNTER — Telehealth: Payer: Self-pay | Admitting: *Deleted

## 2020-11-05 NOTE — Telephone Encounter (Signed)
PPD skin test read yesterday 11/04/20, Negative with 0 mm induration. It was administered at [facility] ED on 11/01/20. Lot # [id number], Exp 02/08/22. Form completed and faxed to Dr. Carilyn Goodpasture with [facility] with the [facility]. Faxed to [phone].

## 2020-11-09 NOTE — Progress Notes (Signed)
[facility]ITY PALLIATIVE C[facility] RN NOTE  PATIENT NAME: Anthony Galvan DOB: 10/01/1921 MRN: [id number]  PRIMARY CARE PROVIDER: [facility] [place]  RESPONSIBLE PARTY: Keevin Panebianco (daughter) Acct ID - Guarantor Home Phone Work Phone Relationship Acct Type  [id number] LAMERE, [phone]  Self P/F     [place], [place], [place] [address]   Covid-19 Pre-screening Negative  PLAN OF CARE and INTERVENTION:  ADVANCE CARE PLANNING/GOALS OF CARE: Goal is for patient to avoid hospitalizations. He has a DNR. PATIENT/CAREGIVER EDUCATION: Explained palliative care services, symptom management, safe mobility, s/s of infection DISEASE STATUS: Initial joint palliative care visit completed with Katheren Puller, MSW. Met with patient and his daughter in their home. Patient was recently seen in the ED for not sleeping well due to not taking his Aricept and also daughter wanted a TB test done for a respite stay for patient. He also had labs drawn. Upon my arrival, he is lying in bed asleep. Easily aroused with verbal and tactile stimulation. He denies pain at this time. He is alert and oriented x 2, and able to engage in conversation. He is mildly confused at times. Breathing is regular and unlabored. He is wearing oxygen at 3L/min via [place] continuously. Daughter states that he is become progressively weaker and that it takes 3 person assistance to transfer him to his wheelchair. He requires assistance with all ADLs. He is incontinent of both bowel and bladder and wears adult briefs. Read TB skin test given in right forearm. It was negative with an induration of 38m. [facility] Daughter is aPatent attorney She is agreeable to future visits with palliative care.   HISTORY OF PRESENT ILLNESS:  This is a 87yo male with a diagnosis of vascular dementia without behavioral disturbance. He has a history of Myocardial infarction, unstable  angina, CAD, CHF, GERD, Osteoarthritis, CKD Stage V and hyperlipidemia. Palliative care team has been asked to follow patient for goals of care and complex decision making.  CODE STATUS: DNR ADVANCED DIRECTIVES: N MOST FORM: no PPS: 30%   PHYSICAL EXAM:   VITALS: Today's Vitals   11/04/20 1202  BP: 113/61  Pulse: 66  Resp: 16  Temp: 97.6 F (36.4 C)  TempSrc: Temporal  SpO2: 100%  PainSc: 0-No pain    LUNGS: clear to auscultation  CARDIAC: Cor RRR EXTREMITIES: No edema SKIN:  Exposed skin is dry and intact; skin ashen in appearance   NEURO:  Alert and oriented x 2, increased generalized weakness, wheelchair bound   (Duration of visit and documentation 60 minutes)   MDaryl Eastern RN BSN

## 2020-11-17 ENCOUNTER — Inpatient Hospital Stay (HOSPITAL_COMMUNITY)
Admission: EM | Admit: 2020-11-17 | Discharge: 2020-12-16 | DRG: 682 | Disposition: E | Payer: No Typology Code available for payment source | Attending: Family Medicine | Admitting: Family Medicine

## 2020-11-17 ENCOUNTER — Encounter (HOSPITAL_COMMUNITY): Payer: Self-pay | Admitting: Emergency Medicine

## 2020-11-17 ENCOUNTER — Other Ambulatory Visit: Payer: Self-pay

## 2020-11-17 ENCOUNTER — Emergency Department (HOSPITAL_COMMUNITY): Payer: No Typology Code available for payment source

## 2020-11-17 DIAGNOSIS — H269 Unspecified cataract: Secondary | ICD-10-CM | POA: Diagnosis present

## 2020-11-17 DIAGNOSIS — N185 Chronic kidney disease, stage 5: Secondary | ICD-10-CM | POA: Diagnosis present

## 2020-11-17 DIAGNOSIS — N3281 Overactive bladder: Secondary | ICD-10-CM | POA: Diagnosis present

## 2020-11-17 DIAGNOSIS — I252 Old myocardial infarction: Secondary | ICD-10-CM | POA: Diagnosis not present

## 2020-11-17 DIAGNOSIS — Z79899 Other long term (current) drug therapy: Secondary | ICD-10-CM

## 2020-11-17 DIAGNOSIS — I132 Hypertensive heart and chronic kidney disease with heart failure and with stage 5 chronic kidney disease, or end stage renal disease: Secondary | ICD-10-CM | POA: Diagnosis present

## 2020-11-17 DIAGNOSIS — N179 Acute kidney failure, unspecified: Secondary | ICD-10-CM | POA: Diagnosis present

## 2020-11-17 DIAGNOSIS — Z20822 Contact with and (suspected) exposure to covid-19: Secondary | ICD-10-CM | POA: Diagnosis present

## 2020-11-17 DIAGNOSIS — I5030 Unspecified diastolic (congestive) heart failure: Secondary | ICD-10-CM | POA: Diagnosis present

## 2020-11-17 DIAGNOSIS — J9611 Chronic respiratory failure with hypoxia: Secondary | ICD-10-CM | POA: Diagnosis present

## 2020-11-17 DIAGNOSIS — Z7189 Other specified counseling: Secondary | ICD-10-CM | POA: Diagnosis not present

## 2020-11-17 DIAGNOSIS — E785 Hyperlipidemia, unspecified: Secondary | ICD-10-CM | POA: Diagnosis present

## 2020-11-17 DIAGNOSIS — I959 Hypotension, unspecified: Secondary | ICD-10-CM | POA: Diagnosis present

## 2020-11-17 DIAGNOSIS — I2781 Cor pulmonale (chronic): Secondary | ICD-10-CM | POA: Diagnosis present

## 2020-11-17 DIAGNOSIS — R64 Cachexia: Secondary | ICD-10-CM | POA: Diagnosis present

## 2020-11-17 DIAGNOSIS — Z8711 Personal history of peptic ulcer disease: Secondary | ICD-10-CM

## 2020-11-17 DIAGNOSIS — Z9861 Coronary angioplasty status: Secondary | ICD-10-CM | POA: Diagnosis not present

## 2020-11-17 DIAGNOSIS — R079 Chest pain, unspecified: Secondary | ICD-10-CM | POA: Diagnosis not present

## 2020-11-17 DIAGNOSIS — I10 Essential (primary) hypertension: Secondary | ICD-10-CM | POA: Diagnosis present

## 2020-11-17 DIAGNOSIS — Z6824 Body mass index (BMI) 24.0-24.9, adult: Secondary | ICD-10-CM

## 2020-11-17 DIAGNOSIS — J9621 Acute and chronic respiratory failure with hypoxia: Secondary | ICD-10-CM | POA: Diagnosis present

## 2020-11-17 DIAGNOSIS — Z515 Encounter for palliative care: Secondary | ICD-10-CM

## 2020-11-17 DIAGNOSIS — J849 Interstitial pulmonary disease, unspecified: Secondary | ICD-10-CM | POA: Diagnosis present

## 2020-11-17 DIAGNOSIS — Z66 Do not resuscitate: Secondary | ICD-10-CM | POA: Diagnosis present

## 2020-11-17 DIAGNOSIS — R778 Other specified abnormalities of plasma proteins: Secondary | ICD-10-CM

## 2020-11-17 DIAGNOSIS — Z955 Presence of coronary angioplasty implant and graft: Secondary | ICD-10-CM

## 2020-11-17 DIAGNOSIS — I2510 Atherosclerotic heart disease of native coronary artery without angina pectoris: Secondary | ICD-10-CM

## 2020-11-17 DIAGNOSIS — F0390 Unspecified dementia without behavioral disturbance: Secondary | ICD-10-CM | POA: Diagnosis present

## 2020-11-17 DIAGNOSIS — R6889 Other general symptoms and signs: Secondary | ICD-10-CM | POA: Diagnosis not present

## 2020-11-17 DIAGNOSIS — Z7982 Long term (current) use of aspirin: Secondary | ICD-10-CM

## 2020-11-17 DIAGNOSIS — L8990 Pressure ulcer of unspecified site, unspecified stage: Secondary | ICD-10-CM | POA: Diagnosis present

## 2020-11-17 DIAGNOSIS — K219 Gastro-esophageal reflux disease without esophagitis: Secondary | ICD-10-CM

## 2020-11-17 DIAGNOSIS — R54 Age-related physical debility: Secondary | ICD-10-CM | POA: Diagnosis present

## 2020-11-17 DIAGNOSIS — N318 Other neuromuscular dysfunction of bladder: Secondary | ICD-10-CM | POA: Diagnosis present

## 2020-11-17 DIAGNOSIS — R109 Unspecified abdominal pain: Secondary | ICD-10-CM | POA: Diagnosis not present

## 2020-11-17 DIAGNOSIS — I25110 Atherosclerotic heart disease of native coronary artery with unstable angina pectoris: Secondary | ICD-10-CM | POA: Diagnosis present

## 2020-11-17 DIAGNOSIS — R0789 Other chest pain: Secondary | ICD-10-CM | POA: Diagnosis not present

## 2020-11-17 DIAGNOSIS — I5033 Acute on chronic diastolic (congestive) heart failure: Secondary | ICD-10-CM

## 2020-11-17 DIAGNOSIS — R32 Unspecified urinary incontinence: Secondary | ICD-10-CM | POA: Diagnosis present

## 2020-11-17 DIAGNOSIS — Z8249 Family history of ischemic heart disease and other diseases of the circulatory system: Secondary | ICD-10-CM

## 2020-11-17 DIAGNOSIS — I272 Pulmonary hypertension, unspecified: Secondary | ICD-10-CM | POA: Diagnosis present

## 2020-11-17 DIAGNOSIS — I200 Unstable angina: Secondary | ICD-10-CM | POA: Diagnosis present

## 2020-11-17 DIAGNOSIS — I5032 Chronic diastolic (congestive) heart failure: Secondary | ICD-10-CM | POA: Diagnosis present

## 2020-11-17 DIAGNOSIS — J9601 Acute respiratory failure with hypoxia: Secondary | ICD-10-CM

## 2020-11-17 DIAGNOSIS — M1990 Unspecified osteoarthritis, unspecified site: Secondary | ICD-10-CM | POA: Diagnosis present

## 2020-11-17 DIAGNOSIS — Z9981 Dependence on supplemental oxygen: Secondary | ICD-10-CM

## 2020-11-17 DIAGNOSIS — Z743 Need for continuous supervision: Secondary | ICD-10-CM | POA: Diagnosis not present

## 2020-11-17 DIAGNOSIS — Z86711 Personal history of pulmonary embolism: Secondary | ICD-10-CM

## 2020-11-17 LAB — RESP PANEL BY RT-PCR (FLU A&B, COVID) ARPGX2
Influenza A by PCR: NEGATIVE
Influenza B by PCR: NEGATIVE
SARS Coronavirus 2 by RT PCR: NEGATIVE

## 2020-11-17 LAB — COMPREHENSIVE METABOLIC PANEL
ALT: 25 U/L (ref 0–44)
AST: 56 U/L — ABNORMAL HIGH (ref 15–41)
Albumin: 3.2 g/dL — ABNORMAL LOW (ref 3.5–5.0)
Alkaline Phosphatase: 61 U/L (ref 38–126)
Anion gap: 18 — ABNORMAL HIGH (ref 5–15)
BUN: 87 mg/dL — ABNORMAL HIGH (ref 8–23)
CO2: 18 mmol/L — ABNORMAL LOW (ref 22–32)
Calcium: 8.2 mg/dL — ABNORMAL LOW (ref 8.9–10.3)
Chloride: 99 mmol/L (ref 98–111)
Creatinine, Ser: 5.62 mg/dL — ABNORMAL HIGH (ref 0.61–1.24)
GFR, Estimated: 9 mL/min — ABNORMAL LOW (ref >60)
Glucose, Bld: 81 mg/dL (ref 70–99)
Potassium: 4.7 mmol/L (ref 3.5–5.1)
Sodium: 135 mmol/L (ref 135–145)
Total Bilirubin: 1.3 mg/dL — ABNORMAL HIGH (ref 0.3–1.2)
Total Protein: 6.6 g/dL (ref 6.5–8.1)

## 2020-11-17 LAB — CBC WITH DIFFERENTIAL/PLATELET
Abs Immature Granulocytes: 0.06 10*3/uL (ref 0.00–0.07)
Basophils Absolute: 0 10*3/uL (ref 0.0–0.1)
Basophils Relative: 0 %
Eosinophils Absolute: 0 10*3/uL (ref 0.0–0.5)
Eosinophils Relative: 0 %
HCT: 43.6 % (ref 39.0–52.0)
Hemoglobin: 13.4 g/dL (ref 13.0–17.0)
Immature Granulocytes: 1 %
Lymphocytes Relative: 12 %
Lymphs Abs: 1.3 10*3/uL (ref 0.7–4.0)
MCH: 30.2 pg (ref 26.0–34.0)
MCHC: 30.7 g/dL (ref 30.0–36.0)
MCV: 98.4 fL (ref 80.0–100.0)
Monocytes Absolute: 0.9 10*3/uL (ref 0.1–1.0)
Monocytes Relative: 9 %
Neutro Abs: 8.3 10*3/uL — ABNORMAL HIGH (ref 1.7–7.7)
Neutrophils Relative %: 78 %
Platelets: 137 10*3/uL — ABNORMAL LOW (ref 150–400)
RBC: 4.43 MIL/uL (ref 4.22–5.81)
RDW: 17.5 % — ABNORMAL HIGH (ref 11.5–15.5)
WBC: 10.6 10*3/uL — ABNORMAL HIGH (ref 4.0–10.5)
nRBC: 0.4 % — ABNORMAL HIGH (ref 0.0–0.2)

## 2020-11-17 LAB — BLOOD GAS, ARTERIAL
Acid-base deficit: 7.5 mmol/L — ABNORMAL HIGH (ref 0.0–2.0)
Bicarbonate: 18.4 mmol/L — ABNORMAL LOW (ref 20.0–28.0)
FIO2: 100
O2 Saturation: 99.1 %
Patient temperature: 37
pCO2 arterial: 36.2 mmHg (ref 32.0–48.0)
pH, Arterial: 7.308 — ABNORMAL LOW (ref 7.350–7.450)
pO2, Arterial: 155 mmHg — ABNORMAL HIGH (ref 83.0–108.0)

## 2020-11-17 LAB — TROPONIN I (HIGH SENSITIVITY): Troponin I (High Sensitivity): 605 ng/L (ref <18)

## 2020-11-17 LAB — BRAIN NATRIURETIC PEPTIDE: B Natriuretic Peptide: 450 pg/mL — ABNORMAL HIGH (ref 0.0–100.0)

## 2020-11-17 MED ORDER — VANCOMYCIN HCL 1250 MG/250ML IV SOLN
1250.0000 mg | Freq: Once | INTRAVENOUS | Status: AC
  Administered 2020-11-17: 1250 mg via INTRAVENOUS
  Filled 2020-11-17: qty 250

## 2020-11-17 MED ORDER — LORAZEPAM 2 MG/ML IJ SOLN
1.0000 mg | INTRAMUSCULAR | Status: DC | PRN
  Administered 2020-11-17: 1 mg via INTRAVENOUS
  Filled 2020-11-17: qty 1

## 2020-11-17 MED ORDER — HYDROMORPHONE HCL 1 MG/ML IJ SOLN
0.5000 mg | INTRAMUSCULAR | Status: DC | PRN
  Administered 2020-11-17: 0.5 mg via INTRAVENOUS
  Filled 2020-11-17: qty 1

## 2020-11-17 MED ORDER — LORAZEPAM 2 MG/ML PO CONC: 1.0000 mg | ORAL | Status: DC | PRN

## 2020-11-17 MED ORDER — LACTATED RINGERS IV BOLUS
1000.0000 mL | Freq: Once | INTRAVENOUS | Status: AC
  Administered 2020-11-17: 1000 mL via INTRAVENOUS

## 2020-11-17 MED ORDER — SODIUM CHLORIDE 0.9 % IV SOLN: INTRAVENOUS | Status: DC

## 2020-11-17 MED ORDER — FENTANYL CITRATE (PF) 100 MCG/2ML IJ SOLN
12.5000 ug | INTRAMUSCULAR | Status: DC | PRN
  Administered 2020-11-17: 12.5 ug via INTRAVENOUS
  Filled 2020-11-17: qty 2

## 2020-11-17 MED ORDER — ACETAMINOPHEN 325 MG PO TABS: 650.0000 mg | ORAL_TABLET | Freq: Four times a day (QID) | ORAL | Status: DC | PRN

## 2020-11-17 MED ORDER — BISACODYL 5 MG PO TBEC: 5.0000 mg | DELAYED_RELEASE_TABLET | Freq: Every day | ORAL | Status: DC | PRN

## 2020-11-17 MED ORDER — ACETAMINOPHEN 650 MG RE SUPP: 650.0000 mg | Freq: Four times a day (QID) | RECTAL | Status: DC | PRN

## 2020-11-17 MED ORDER — ONDANSETRON HCL 4 MG/2ML IJ SOLN: 4.0000 mg | Freq: Four times a day (QID) | INTRAMUSCULAR | Status: DC | PRN

## 2020-11-17 MED ORDER — SODIUM CHLORIDE 0.9 % IV BOLUS
1000.0000 mL | Freq: Once | INTRAVENOUS | Status: AC
  Administered 2020-11-17: 1000 mL via INTRAVENOUS

## 2020-11-17 MED ORDER — HEPARIN SODIUM (PORCINE) 5000 UNIT/ML IJ SOLN: 5000.0000 [IU] | Freq: Three times a day (TID) | INTRAMUSCULAR | Status: DC

## 2020-11-17 MED ORDER — LORAZEPAM 1 MG PO TABS: 1.0000 mg | ORAL_TABLET | ORAL | Status: DC | PRN

## 2020-11-17 MED ORDER — ALBUTEROL SULFATE (2.5 MG/3ML) 0.083% IN NEBU: 2.5000 mg | INHALATION_SOLUTION | RESPIRATORY_TRACT | Status: DC | PRN

## 2020-11-17 MED ORDER — OXYCODONE HCL 20 MG/ML PO CONC: 5.0000 mg | ORAL | Status: DC | PRN

## 2020-11-17 MED ORDER — SODIUM CHLORIDE 0.9 % IV SOLN
2.0000 g | Freq: Once | INTRAVENOUS | Status: AC
  Administered 2020-11-17: 2 g via INTRAVENOUS
  Filled 2020-11-17: qty 2

## 2020-11-17 MED ORDER — ONDANSETRON HCL 4 MG PO TABS: 4.0000 mg | ORAL_TABLET | Freq: Four times a day (QID) | ORAL | Status: DC | PRN

## 2020-11-17 MED ORDER — DIPHENHYDRAMINE HCL 50 MG/ML IJ SOLN: 12.5000 mg | Freq: Four times a day (QID) | INTRAMUSCULAR | Status: DC | PRN

## 2020-11-17 MED ORDER — ATROPINE SULFATE 1 % OP SOLN: 4.0000 [drp] | OPHTHALMIC | Status: DC | PRN

## 2020-11-17 MED ORDER — POLYVINYL ALCOHOL 1.4 % OP SOLN: 1.0000 [drp] | Freq: Four times a day (QID) | OPHTHALMIC | Status: DC | PRN

## 2020-11-18 ENCOUNTER — Ambulatory Visit: Payer: No Typology Code available for payment source | Admitting: Student

## 2020-11-23 LAB — CULTURE, BLOOD (ROUTINE X 2)
Culture: NO GROWTH
Culture: NO GROWTH

## 2020-12-16 NOTE — ED Notes (Signed)
Daughter states she has been giving him nitro all night due to chest pain. Pt denies chest pain at this time. Daughter states she needs a break from taking care of him due to the fact she has been up for 2 days taking care of him.

## 2020-12-16 NOTE — ED Notes (Signed)
Patient continues to be alert and responds to commands upon entering room.

## 2020-12-16 NOTE — Consult Note (Signed)
Consultation Note Date: 12/02/2020   Patient Name: Anthony Galvan  DOB: [birth year]  MRN: [id number]  Age / Sex: 87 y.o., male  PCP: [facility] [place] Referring Physician: Murlean Iba, MD  Reason for Consultation: Establishing goals of care and Hospice Evaluation  HPI/Patient Profile: 87 y.o. male  with past medical history of advanced dementia, CAD, stage V CKD, ILD with bronchiectasis, GERD, duodenal ulcer, Hyperlipidemia, HTN, OA, stage 1 sacral pressure wound, advanced age active with [facility] outpatient palliative services admitted on 2020-12-02 with acute kidney injury on CKD 5 exacerbated by poor by mouth intake.   Clinical Assessment and Goals of Care: I have reviewed medical records including EPIC notes, labs and imaging, received report from RN, assessed the patient and then met at the bedside along with daughter, Anthony Galvan, to discuss diagnosis prognosis, GOC, EOL wishes, disposition and options.  I introduced Palliative Medicine as specialized medical care for people living with serious illness. It focuses on providing relief from the symptoms and stress of a serious illness. The goal is to improve quality of life for both the patient and the family.  We discussed their current illness and desire to focus on comfort care. The natural disease trajectory and expectations at EOL were discussed.  No special needs at this time.  Discussed the importance of continued conversation with family and the medical providers regarding overall plan of care and treatment options, ensuring decisions are within the context of the patient's values and GOCs.  Questions and concerns were addressed.  The family was encouraged to call with questions or concerns.  PMT will continue to support holistically.  Conference with attending, bedside nursing staff related to patient condition, needs, goals of  care.    HCPOA   NEXT OF KIN -daughter    SUMMARY OF RECOMMENDATIONS   Full comfort care Anticipate hospital death   Code Status/Advance Care Planning: DNR  Symptom Management:  Per hospitalist, no additional needs at this time.  Palliative Prophylaxis:  Frequent Pain Assessment, Palliative Wound Care, and Turn Reposition  Additional Recommendations (Limitations, Scope, Preferences): Full Comfort Care  Psycho-social/Spiritual:  Desire for further Chaplaincy support:no Additional Recommendations: Caregiving  Support/Resources and Education on Hospice  Prognosis:  < 2 weeks, possibly hours to days  Discharge Planning:  To be determined, possible in-hospital death       Primary Diagnoses: Present on Admission:  Acute renal failure (ARF) ([place])  Hyperlipidemia with target low density lipoprotein (LDL) cholesterol less than 70 mg/dL  CATARACT NOS  Essential hypertension  OVERACTIVE BLADDER  URINARY INCONTINENCE  Unstable angina (HCC)  CKD (chronic kidney disease), stage V (HCC)  (HFpEF) heart failure with preserved ejection fraction (HCC)  Pressure injury of skin  ILD (interstitial lung disease) ([place])  DNR (do not resuscitate)   I have reviewed the medical record, interviewed the patient and family, and examined the patient. The following aspects are pertinent.  Past Medical History:  Diagnosis Date   Bronchiectasis ([place]) 06/13/2018   CAD (coronary artery disease)  08/01/2012   BMS mid LAD; catheterization in July 2018 showed with severe heavily calcified distal circumflex-left PDA stenosis, not favorable for PCI.   Congestive heart failure (CHF) ([place])    Duodenal ulcer May 2011   on EGD   Essential hypertension    GERD (gastroesophageal reflux disease)    Helicobacter pylori gastritis 2012   Hyperlipidemia    ILD (interstitial lung disease) (HCC)    MI (myocardial infarction) ([place]) 1976, 1980, 1996   C[facility] for at [facility] [place] [facility] (Dr. Iona Beard)   Osteoarthritis    Pulmonary embolism [facility]    Pulmonary hypertension ([place])    S/P colonoscopy 2011   normal, internal hemorrhoids   Social History   Socioeconomic History   Marital status: Widowed    Spouse name: Not on file   Number of children: 3   Years of education: Not on file   Highest education level: Not on file  Occupational History   Not on file  Tobacco Use   Smoking status: Never   Smokeless tobacco: Never  Vaping Use   Vaping Use: Never used  Substance and Sexual Activity   Alcohol use: No   Drug use: No   Sexual activity: Not on file  Other Topics Concern   Not on file  Social History Narrative   Is a father of 3 with 3 grandchildren 3 great-grandchildren. He lives with one of his 2 grandsons.   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 He was a war 2 veteran in the [place]. 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   He never smoked. He does not drink alcohol.   He is very active, and a majority. He walks 30+ minutes every day. He also likes to play golf.   Social Determinants of Health   Financial Resource Strain: Not on file  Food Insecurity: Not on file  Transportation Needs: Not on file  Physical Activity: Not on file  Stress: Not on file  Social Connections: Not on file   Family History  Problem Relation Age of Onset   Heart disease Mother    Kidney disease Father    Heart disease Brother 71   Heart disease Sister    Colon cancer Neg Hx    Scheduled Meds: Continuous Infusions: PRN Meds:.acetaminophen **OR** acetaminophen, albuterol, atropine, bisacodyl, diphenhydrAMINE, HYDROmorphone (DILAUDID) injection, LORazepam **OR** [DISCONTINUED] LORazepam **OR** LORazepam, ondansetron **OR** ondansetron (ZOFRAN) IV, oxyCODONE **OR** oxyCODONE, polyvinyl alcohol Medications Prior to Admission:  Prior to Admission  medications   Medication Sig Start Date End Date Taking? Authorizing Provider  acetaminophen (TYLENOL) 325 MG tablet Take 2 tablets (650 mg total) by mouth 3 (three) times daily as needed (pain). Patient taking differently: Take 650 mg by mouth in the morning and at bedtime. 12/04/16   Ainsley Spinner, PA-C  alum & mag hydroxide-simeth (MAALOX/MYLANTA) 200-200-20 MG/5ML suspension Take 30 mLs by mouth every 4 (four) hours as needed for indigestion. 01/29/19   Johnson, Clanford L, MD  aspirin EC 81 MG tablet Take 1 tablet (81 mg total) by mouth daily. Swallow whole. 10/01/19   Strader, Fransisco Hertz, PA-C  atorvastatin (LIPITOR) 80 MG tablet Take 1 tablet (80 mg total) by mouth daily at 6 PM. 10/14/20   Verta Ellen., NP  bisoprolol (ZEBETA) 5 MG tablet Take 2.5 mg by mouth daily.  [provider]  calcium carbonate (OS-CAL - DOSED IN MG OF ELEMENTAL CALCIUM) 1250 (500 Ca) MG tablet Take 1 tablet (500 mg of elemental calcium total) by mouth daily with breakfast. 05/13/18   Mullis, Kiersten P, DO  dextromethorphan-guaiFENesin (ROBITUSSIN-DM) 10-100 MG/5ML liquid TAKE 2 TEASPOONSFUL BY MOUTH TWO TIMES A DAY AS NEEDED FOR COUGH 12/17/19   [provider]  donepezil (ARICEPT) 10 MG tablet Take 1 tablet (10 mg total) by mouth at bedtime. 11/01/20   Tacy Learn, PA-C  hydrOXYzine (ATARAX/VISTARIL) 25 MG tablet Take 1 at bedtime if needed for sleep 11/01/20   Tacy Learn, PA-C  ipratropium-albuterol (DUONEB) 0.5-2.5 (3) MG/3ML SOLN Take 3 mLs by nebulization 3 (three) times daily. 01/29/19   Johnson, Clanford L, MD  isosorbide mononitrate (IMDUR) 30 MG 24 hr tablet Take 90 mg by mouth daily.    [provider]  mirtazapine (REMERON) 7.5 MG tablet TAKE ONE TABLET BY MOUTH AT BEDTIME FOR DEPRESSION 06/25/20   [provider]  Multiple Vitamin (MULTIVITAMIN WITH MINERALS) TABS tablet Take 1 tablet by mouth daily.    [provider]  NITROSTAT 0.4 MG SL tablet PLACE 1  TABLET UNDER TONGUE EVERY 5 MINUTES FOR 3 DOSES AS NEEDED. Patient taking differently: Place 0.4 mg under the tongue every 5 (five) minutes as needed for chest pain. 11/17/13   Leonie Man, MD  pantoprazole (PROTONIX) 40 MG tablet Take 1 tablet (40 mg total) by mouth daily. 01/30/19   Johnson, Clanford L, MD  polyethylene glycol (MIRALAX / GLYCOLAX) 17 g packet Take 17 g by mouth as needed. 04/19/20   Geradine Girt, DO  torsemide (DEMADEX) 20 MG tablet Take 2 tablets (40 mg total) by mouth daily. 10/14/20   Verta Ellen., NP  Vitamin D, Ergocalciferol, (DRISDOL) 1.25 MG (50000 UT) CAPS capsule Take 1 capsule (50,000 Units total) by mouth every 7 (seven) days. 05/16/18   Mullis, Kiersten P, DO  Zinc Oxide 10 % OINT Apply 1 application topically daily as needed.    [provider]   Allergies  Allergen Reactions   Lisinopril Other (See Comments)    "Allergic," per the [facility]- patient is unaware of any reaction (??)   Review of Systems  Unable to perform ROS: Acuity of condition   Physical Exam Vitals and nursing note reviewed.  Constitutional:      General: He is in acute distress.     Appearance: He is ill-appearing.     Comments: Frail and thin  Cardiovascular:     Rate and Rhythm: Normal rate.  Pulmonary:     Effort: Pulmonary effort is normal. No tachypnea.  Musculoskeletal:     Right lower leg: No edema.     Left lower leg: No edema.  Neurological:     Mental Status: He is alert.     Comments: Comfort care, orientation questions not asked  Psychiatric:     Comments: Calm and cooperative, not fearful    Vital Signs: BP (!) 72/50   Pulse (!) 59   Temp (!) 96 F (35.6 C) (Rectal)   Resp (!) 23   Ht _0  (1.626 m)   Wt 64 kg   SpO2 98%   BMI 24.22 kg/m  Pain Scale: 0-10   Pain Score: 0-No pain   SpO2: SpO2: 98 % O2 Device:SpO2: 98 % O2 Flow Rate: .O2 Flow Rate (L/min): 6 L/min  IO: Intake/output summary:  Intake/Output Summary (Last 24 hours) at  12-13-2020 1539 Last data filed at 12/13/2020 1342 Gross per 24 hour  Intake 4706.23 ml  Output --  Net 4706.23 ml    LBM:   Baseline Weight: Weight: 64 kg Most recent weight: Weight: 64 kg     Palliative Assessment/Data:   Flowsheet Rows    Flowsheet Row Most Recent Value  Intake Tab   Referral Department Hospitalist  Unit at Time of Referral ER  Palliative Care Primary Diagnosis Other (Comment)  Date Notified 13-Dec-2020  Palliative Care Type New Palliative care  Reason for referral Counsel Regarding Hospice  Date of Admission December 13, 2020  Date first seen by Palliative Care 12/13/20  # of days Palliative referral response time 0 Day(s)  # of days IP prior to Palliative referral 0  Clinical Assessment   Palliative Performance Scale Score 10%  Pain Max last 24 hours Not able to report  Pain Min Last 24 hours Not able to report  Dyspnea Max Last 24 Hours Not able to report  Dyspnea Min Last 24 hours Not able to report  Psychosocial & Spiritual Assessment   Palliative Care Outcomes        Time In: 1500 Time Out: 1550 Time Total: 50 minutes  Greater than 50%  of this time was spent counseling and coordinating care related to the above assessment and plan.  Signed by: Drue Novel, NP   Please contact Palliative Medicine Team phone at [phone] for questions and concerns.  For individual provider: See Shea Evans

## 2020-12-16 NOTE — H&P (Addendum)
History and Physical  [place] [id number] DOB: 03-Dec-1921 DOA: Dec 14, 2020  PCP: [facility] [place]  Patient coming from: home  Level of care: Med-Surg  I have personally briefly reviewed patient's old medical records in [place]  Chief Complaint: shortness of breath   HPI: Anthony Galvan is a 87 y.o. male with medical history significant for advanced dementia, CAD, stage V CKD, ILD with bronchiectasis, GERD, duodenal ulcer, Hyperlipidemia, HTN, OA, stage 1 sacral pressure wound, advanced age who for the past several days has been having a increasing shortness of breath and chest pain symptoms at home.  Daughter has been caring for him at home and reporting that she has been giving him sublingual nitroglycerin tablets for most of the night because of complaints of pain.  He also complained of shortness of breath.  He was found by EMS to be hypoxic with oxygenation in the 70s.  He was also noted to be hypotensive.  Daughter reported that patient had stopped eating and drinking over the last several days.  ED Course: Patient was given IV fluid bolus hydration in the ED with improvement in blood pressure however subsequently BP began to worsen and patient began to complain of pain and discomfort.  Daughter confirmed DNR DNI status.  Goals of care meeting with attending physician and daughter healthcare power of attorney and decision was made to pursue full comfort care measures due to ongoing complaints of abdominal pain and shortness of breath with chest pain symptoms.  Patient admitted for full comfort measures.  Review of Systems: Review of Systems  Unable to perform ROS: Dementia    Past Medical History:  Diagnosis Date   Bronchiectasis ([place]) 06/13/2018   CAD (coronary artery disease) 08/01/2012   BMS mid LAD; catheterization in July 2018 showed with severe heavily calcified distal circumflex-left PDA stenosis, not favorable for PCI.   Congestive  heart failure (CHF) ([place])    Duodenal ulcer May 2011   on EGD   Essential hypertension    GERD (gastroesophageal reflux disease)    Helicobacter pylori gastritis 2012   Hyperlipidemia    ILD (interstitial lung disease) (HCC)    MI (myocardial infarction) ([place]) 1976, 1980, [place] [facility] at [facility] and [place] (Dr. Iona Beard)   Osteoarthritis    Pulmonary embolism [facility])    Pulmonary hypertension ([place])    S/P colonoscopy 2011   normal, internal hemorrhoids    Past Surgical History:  Procedure Laterality Date   CARDIAC CATHETERIZATION  01/30/2007   D1 75 %, mid LAD 50%, 50-70% circumflex, 50-70% RCA.(Dr. Adora Fridge)   CARDIAC CATHETERIZATION  12/11/2001   normal L main, small RCA, dominant LL Cfx with mild diffuse disease, LAD with mid 10-20% stenosis, ramus intermedius with mild diffuse disease (Dr. Jackie Plum)   CATARACT EXTRACTION     COLONOSCOPY  OCT 2011 ARS   [facility] [place]   in setting of MI   ESOPHAGOGASTRODUODENOSCOPY N/A 10/09/2016   Procedure: ESOPHAGOGASTRODUODENOSCOPY (EGD);  Surgeon: Danie Binder, MD;  [facility];  Service: Endoscopy;  Laterality: N/A;   FEMUR IM NAIL Right 12/01/2016   Procedure: INTRAMEDULLARY (IM) NAIL RIGHT HIP;  Surgeon: Altamese Fairland, MD;  Location: [place];  Service: Orthopedics;  Laterality: Right;   FEMUR IM NAIL Left 05/07/2018   Procedure: INTRAMEDULLARY (IM) NAIL FEMORAL;  Surgeon: Nicholes Stairs, MD;  Location: [place];  Service:  Orthopedics;  Laterality: Left;   LEFT HEART CATH AND CORONARY ANGIOGRAPHY N/A 10/23/2016   Procedure: Left Heart Cath and Coronary Angiography;  Surgeon: Sherren Mocha, MD;  Location: [place] CV LAB;  Service: Cardiovascular: Two-vessel CAD (left dominant).  Patent BMS in p LAD.  Severe, heavily calcified dCx-LPDA lesion.  Not favorable for PCI.   LEFT HEART CATHETERIZATION WITH CORONARY ANGIOGRAM N/A 08/01/2012   Procedure: LEFT HEART CATHETERIZATION WITH  CORONARY ANGIOGRAM;  Surgeon: Leonie Man, MD;  Location: [facility] CATH LAB: Mid LAD 99% apple core; D2 ostial 70-80% (2 small for PCI) small nondominant RCA 60-70%. Distal circumflex/L PDA ~50% --> PCI of LAD   NM MYOCAR PERF WALL MOTION  08/2003   adenosine stress - focal decreased perfusion defect in distal inferior wall, no significant ischemic changes   PERCUTANEOUS STENT INTERVENTION  08/01/2012   Procedure: PERCUTANEOUS STENT INTERVENTION;  Surgeon: Leonie Man, MD;  Location: [facility] CATH LAB; ;PCI of mid LAD with a VeriFlex Bare Metal Stent 3.0 mm x 12 mm - post-dilated to 3.5 mm.   TRANSTHORACIC ECHOCARDIOGRAM  10/2016   mild LVH - 60-65%. Gr 1 DD. No RWMA. Mild MR. Mod TR. Mild RA dilation.  PAP ~ 60 mmHg.   UPPER GASTROINTESTINAL ENDOSCOPY  08/2009   MELENA, HEMATEMESIS --> DUODENAL ULCER, Bx: H PYLORI POS     reports that he has never smoked. He has never used smokeless tobacco. He reports that he does not drink alcohol and does not use drugs.  Allergies  Allergen Reactions   Lisinopril Other (See Comments)    "Allergic," per the [facility]- patient is unaware of any reaction (??)    Family History  Problem Relation Age of Onset   Heart disease Mother    Kidney disease Father    Heart disease Brother 24   Heart disease Sister    Colon cancer Neg Hx     Prior to Admission medications   Medication Sig Start Date End Date Taking? Authorizing Provider  acetaminophen (TYLENOL) 325 MG tablet Take 2 tablets (650 mg total) by mouth 3 (three) times daily as needed (pain). Patient taking differently: Take 650 mg by mouth in the morning and at bedtime. 12/04/16   Ainsley Spinner, PA-C  alum & mag hydroxide-simeth (MAALOX/MYLANTA) 200-200-20 MG/5ML suspension Take 30 mLs by mouth every 4 (four) hours as needed for indigestion. 01/29/19   Zoejane Gaulin L, MD  aspirin EC 81 MG tablet Take 1 tablet (81 mg total) by mouth daily. Swallow whole. 10/01/19   Strader, Fransisco Hertz, PA-C  atorvastatin  (LIPITOR) 80 MG tablet Take 1 tablet (80 mg total) by mouth daily at 6 PM. 10/14/20   Verta Ellen., NP  bisoprolol (ZEBETA) 5 MG tablet Take 2.5 mg by mouth daily.    [provider]  calcium carbonate (OS-CAL - DOSED IN MG OF ELEMENTAL CALCIUM) 1250 (500 Ca) MG tablet Take 1 tablet (500 mg of elemental calcium total) by mouth daily with breakfast. 05/13/18   Mullis, Kiersten P, DO  dextromethorphan-guaiFENesin (ROBITUSSIN-DM) 10-100 MG/5ML liquid TAKE 2 TEASPOONSFUL BY MOUTH TWO TIMES A DAY AS NEEDED FOR COUGH 12/17/19   [provider]  donepezil (ARICEPT) 10 MG tablet Take 1 tablet (10 mg total) by mouth at bedtime. 11/01/20   Tacy Learn, PA-C  hydrOXYzine (ATARAX/VISTARIL) 25 MG tablet Take 1 at bedtime if needed for sleep 11/01/20   Suella Broad A, PA-C  ipratropium-albuterol (DUONEB) 0.5-2.5 (3) MG/3ML SOLN Take 3 mLs by  nebulization 3 (three) times daily. 01/29/19   Chloe Bluett L, MD  isosorbide mononitrate (IMDUR) 30 MG 24 hr tablet Take 90 mg by mouth daily.    [provider]  mirtazapine (REMERON) 7.5 MG tablet TAKE ONE TABLET BY MOUTH AT BEDTIME FOR DEPRESSION 06/25/20   [provider]  Multiple Vitamin (MULTIVITAMIN WITH MINERALS) TABS tablet Take 1 tablet by mouth daily.    [provider]  NITROSTAT 0.4 MG SL tablet PLACE 1 TABLET UNDER TONGUE EVERY 5 MINUTES FOR 3 DOSES AS NEEDED. Patient taking differently: Place 0.4 mg under the tongue every 5 (five) minutes as needed for chest pain. 11/17/13   Leonie Man, MD  pantoprazole (PROTONIX) 40 MG tablet Take 1 tablet (40 mg total) by mouth daily. 01/30/19   Wells Mabe L, MD  polyethylene glycol (MIRALAX / GLYCOLAX) 17 g packet Take 17 g by mouth as needed. 04/19/20   Geradine Girt, DO  torsemide (DEMADEX) 20 MG tablet Take 2 tablets (40 mg total) by mouth daily. 10/14/20   Verta Ellen., NP  Vitamin D, Ergocalciferol, (DRISDOL) 1.25 MG (50000 UT) CAPS capsule Take 1  capsule (50,000 Units total) by mouth every 7 (seven) days. 05/16/18   Mullis, Kiersten P, DO  Zinc Oxide 10 % OINT Apply 1 application topically daily as needed.    [provider]    Physical Exam: Vitals:   2020-12-13 0930 12-13-2020 0945 12/13/20 1022 12-13-20 1030  BP: 96/83 91/62    Pulse: (!) 57 62  66  Resp: '20 20  16  '$ Temp:      TempSrc:      SpO2:   92% 100%  Weight:      Height:        Constitutional: elderly frail, ashen gray skin appearance,  calm, mild to moderately uncomfortable Eyes: PERRL, lids and conjunctivae normal ENMT: Mucous membranes are dry. Posterior pharynx clear of any exudate or lesions. Edentulous.  Neck: normal, supple, no masses, no thyromegaly Respiratory: clear to auscultation bilaterally, no wheezing, no crackles. Normal respiratory effort. No accessory muscle use.  Cardiovascular: normal s1, s2 sounds, no murmurs / rubs / gallops. No extremity edema. 2+ pedal pulses. No carotid bruits.  Abdomen: no tenderness, no masses palpated. No hepatosplenomegaly. Bowel sounds positive.  Musculoskeletal: no clubbing / cyanosis. No joint deformity upper and lower extremities. Good ROM, no contractures. Normal muscle tone.  Skin: no rashes, lesions, ulcers. No induration Neurologic: CN 2-12 grossly intact. Sensation intact, DTR normal. Strength 5/5 in all 4.  Psychiatric: Normal judgment and insight. Alert and oriented x 3. Normal mood.   Pressure Injury 08/19/20 Sacrum Right;Left;Mid Stage 1 -  Intact skin with non-blanchable redness of a localized area usually over a bony prominence. Stage 1 non blanchable pressure ulcer (Active)  08/19/20 1940  Location: Sacrum  Location Orientation: Right;Left;Mid  Staging: Stage 1 -  Intact skin with non-blanchable redness of a localized area usually over a bony prominence.  Wound Description (Comments): Stage 1 non blanchable pressure ulcer  Present on Admission: Yes    Labs on Admission: I have personally  reviewed following labs and imaging studies  CBC: Recent Labs  Lab 12/13/2020 0703  WBC 10.6*  NEUTROABS 8.3*  HGB 13.4  HCT 43.6  MCV 98.4  PLT 0000000*   Basic Metabolic Panel: Recent Labs  Lab 12-13-20 0703  NA 135  K 4.7  CL 99  CO2 18*  GLUCOSE 81  BUN 87*  CREATININE 5.62*  CALCIUM 8.2*   GFR: Estimated Creatinine Clearance: 6.4 mL/min (A) (by C-G formula based on SCr of 5.62 mg/dL (H)). Liver Function Tests: Recent Labs  Lab November 30, 2020 0703  AST 56*  ALT 25  ALKPHOS 61  BILITOT 1.3*  PROT 6.6  ALBUMIN 3.2*   No results for input(s): LIPASE, AMYLASE in the last 168 hours. No results for input(s): AMMONIA in the last 168 hours. Coagulation Profile: No results for input(s): INR, PROTIME in the last 168 hours. Cardiac Enzymes: No results for input(s): CKTOTAL, CKMB, CKMBINDEX, TROPONINI in the last 168 hours. BNP (last 3 results) No results for input(s): PROBNP in the last 8760 hours. HbA1C: No results for input(s): HGBA1C in the last 72 hours. CBG: No results for input(s): GLUCAP in the last 168 hours. Lipid Profile: No results for input(s): CHOL, HDL, LDLCALC, TRIG, CHOLHDL, LDLDIRECT in the last 72 hours. Thyroid Function Tests: No results for input(s): TSH, T4TOTAL, FREET4, T3FREE, THYROIDAB in the last 72 hours. Anemia Panel: No results for input(s): VITAMINB12, FOLATE, FERRITIN, TIBC, IRON, RETICCTPCT in the last 72 hours. Urine analysis:    Component Value Date/Time   COLORURINE YELLOW 08/18/2020 2337   APPEARANCEUR HAZY (A) 08/18/2020 2337   APPEARANCEUR Clear 01/18/2017 1554   LABSPEC 1.025 08/18/2020 2337   PHURINE 5.0 08/18/2020 [place] 08/18/2020 2337   HGBUR NEGATIVE 08/18/2020 2337   BILIRUBINUR NEGATIVE 08/18/2020 2337   BILIRUBINUR Negative 01/18/2017 [place] 08/18/2020 2337   PROTEINUR 30 (A) 08/18/2020 2337   UROBILINOGEN 1.0 08/27/2009 0830   NITRITE NEGATIVE 08/18/2020 2337   LEUKOCYTESUR  NEGATIVE 08/18/2020 2337    Radiological Exams on Admission: DG Chest Portable 1 View  Result Date: 2020-11-30 CLINICAL DATA:  87 year old male with history of shortness of breath and chest pain. EXAM: PORTABLE CHEST 1 VIEW COMPARISON:  Chest x-ray 08/24/2020. FINDINGS: Lung volumes are low with bibasilar opacities which may reflect areas of atelectasis and/or consolidation. Small left pleural effusion. No definite right pleural effusion. No pneumothorax. Widespread areas of interstitial prominence which are chronic and similar to the prior examination. No evidence of pulmonary edema. Heart size is mildly enlarged. The patient is rotated to the left on today's exam, resulting in distortion of the mediastinal contours and reduced diagnostic sensitivity and specificity for mediastinal pathology. Atherosclerotic calcifications in the thoracic aorta. IMPRESSION: 1. Low lung volumes with bibasilar areas of atelectasis and/or consolidation. 2. Small left pleural effusion. 3. Mild cardiomegaly. 4. Aortic atherosclerosis. Electronically Signed   By: Vinnie Langton M.D.   On: 2020-11-30 07:22    EKG: Independently reviewed. No acute ST T wave abnormalities  Assessment/Plan Active Problems:   Hyperlipidemia with target low density lipoprotein (LDL) cholesterol less than 70 mg/dL   CATARACT NOS   Essential hypertension   MYOCARDIAL INFARCTION, HX OF   GERD (gastroesophageal reflux disease)   OVERACTIVE BLADDER   URINARY INCONTINENCE   Unstable angina (HCC)   Presence of bare metal stent in LAD coronary artery - VerifFlex BMS 3.0 mm x 12 mm - post-dilated to 3.5 mm   CAD S/P percutaneous coronary angioplasty   History of coronary artery stent placement   CKD (chronic kidney disease), stage V (HCC)   (HFpEF) heart failure with preserved ejection fraction (HCC)   Pressure injury of skin   Acute renal failure (ARF) ([place])   ILD (interstitial lung disease) ([place])   DNR (do not resuscitate)    AKI on  CKD stage V -this is likely  exacerbated by poor oral intake over past several days in addition to taking diuretics.  He was given gentle IV fluid hydration which initially did improve blood pressure but now BP trending down.  After discussion with family decision made to pursue full comfort measures.  DC IV fluids at this time and focus on full comfort care.  Hypotension -secondary to volume depletion from poor oral intake and diuretics initially treated with IV fluid boluses however now plan is for full comfort measures.  Acute on chronic respiratory failure with hypoxia secondary to exacerbation of interstitial lung disease-treated supportively with supplemental oxygen and now full comfort measures.  CAD - full comfort measures  HFpEF - full comfort measures  GERD - full comfort measures  DNR/DNI - confirmed with family, full comfort measures ordered and will liberalize visitation.   DVT prophylaxis: full comfort measures   Code Status: DNR   Family Communication: daughters   Disposition Plan: anticipate hospital death   Consults called: palliative care   Admission status: INP   Level of care: Med-Surg Irwin Brakeman MD [facility] How to contact the [facility] Attending or Consulting provider [place] or covering provider during after hours [place], for this patient?  Check the care team in [facility] and look for a) attending/consulting [facility] provider listed and b) the [facility] team listed Log into [email or web address] and use [place]'s universal password to access. If you do not have the password, please contact the hospital operator. Locate the [facility] provider you are looking for under [facility] and page to a number that you can be directly reached. If you still have difficulty reaching the provider, please page the [facility] (Director on Call) for the Hospitalists listed on amion for assistance.   If 7PM-7AM, please contact night-coverage [email or web address] Password [email or web address]  12-01-20, 10:40 AM

## 2020-12-16 NOTE — ED Notes (Signed)
patient's daughter Santiago Glad updated at this time and states she is coming with family and will be here shortly.

## 2020-12-16 NOTE — Progress Notes (Signed)
Received report from [place], [place]. Pt arrived to the unit unresponsive, hypotensive, hypoxic on 2L/[place] O2, respirations 5/min. Daughter at bedside. Pt had bright red blood oozing from his rectum. Condom catheter placed w/o complications. Pt expired @ [id number]. RN pronounced pt. Daughter at bedside. Emotional support provided to family. DNR/DNI. Comfort care measures in place. Dr. Wynetta Emery, MD notified. Family provided time to grieve and mourn.

## 2020-12-16 NOTE — ED Provider Notes (Addendum)
[facility] EMERGENCY DEPARTMENT Provider Note   CSN: [id number] Arrival date & time: 2020-12-06  [id number]     History Chief Complaint  Patient presents with  . Shortness of Breath    Anthony Galvan is a 87 y.o. male.  Level 5 caveat for acuity of condition.  Patient with a history of CAD, bronchiectasis, chronic oxygen use for interstitial lung disease, pulmonary embolism, hypertension here with hypoxia and shortness of breath since this morning.  Patient unable to give any history.  EMS reports they were called out for cardiac arrest with patient in bed.  However he was breathing on their arrival saturating in the 70s on his 5 L.  Patient's daughter stated he was "in and out of it".  States he fell off the cliff at 5 AM this morning in terms of his breathing and he became hypoxic into the 70s and she activated EMS.  Has had a cough as well as some chest pain received 1 nitroglycerin last night.  No leg pain or leg swelling.  Patient's daughter Kino Dingeldein confirms DNR and DNI.  The history is provided by the patient, the EMS personnel and a relative. The history is limited by the condition of the patient.  Shortness of Breath     Past Medical History:  Diagnosis Date  . Bronchiectasis ([place]) 06/13/2018  . CAD (coronary artery disease) 08/01/2012   BMS mid LAD; catheterization in July 2018 showed with severe heavily calcified distal circumflex-left PDA stenosis, not favorable for PCI.  Marland Kitchen Congestive heart failure (CHF) ([place])   . Duodenal ulcer May 2011   on EGD  . Essential hypertension   . GERD (gastroesophageal reflux disease)   . Helicobacter pylori gastritis 2012  . Hyperlipidemia   . ILD (interstitial lung disease) ([place])   . MI (myocardial infarction) ([place]) 1976, 1980, [place] [facility] at [facility] and [place] (Dr. Iona Beard)  . Osteoarthritis   . Pulmonary embolism ([place])   . Pulmonary hypertension ([place])   . S/P colonoscopy 2011   normal, internal hemorrhoids     Patient Active Problem List   Diagnosis Date Noted  . Pressure injury of skin 08/24/2020  . Fall at home, initial encounter 08/19/2020  . Syncope 08/19/2020  . Macrocytic anemia 08/19/2020  . Symptomatic anemia 08/19/2020  . Elevated d-dimer 08/19/2020  . (HFpEF) heart failure with preserved ejection fraction ([place]) 08/19/2020  . Acute CHF ([place]) 08/19/2020  . Chest pain 08/18/2020  . Hyperbilirubinemia 04/13/2020  . CKD (chronic kidney disease), stage V ([place]) 04/13/2020  . NSTEMI (non-ST elevated myocardial infarction) ([place]) 04/13/2020  . Acute coronary syndrome with high troponin ([place]) 04/12/2020  . Constipation 05/29/2019  . Acute on chronic respiratory failure ([place])   . Cor pulmonale (chronic) ([place]) 01/26/2019  . Chronic respiratory failure with hypoxia ([place]) 01/25/2019  . Bronchiectasis ([place]) 01/25/2019  . Acute CHF (congestive heart failure) ([place]) 01/24/2019  . Normocytic anemia 01/24/2019  . Thrombocytopenia ([place]) 01/24/2019  . Interstitial lung disease ([place]) 06/15/2018  . Pulmonary hypertension ([place]) 06/15/2018  . Acute pulmonary embolus ([place]) 06/14/2018  . Acute on chronic diastolic CHF (congestive heart failure) ([place]) 06/12/2018  . Acute respiratory failure with hypoxia ([place]) 06/12/2018  . Fracture 05/07/2018  . Closed displaced intertrochanteric fracture of left femur ([place])   . Coronary artery disease involving native heart without angina pectoris   . Dyspepsia 01/18/2017  . Closed disp intertrochanteric fracture of right femur with nonunion 11/30/2016  . PUD (peptic ulcer  disease)   . History of coronary artery stent placement 10/08/2016  . Acute on chronic renal failure ([place]) 10/08/2016  . CKD (chronic kidney disease) stage 3, GFR 30-59 ml/min (HCC) 10/08/2016  . Bloating 09/03/2013  . CAD S/P percutaneous coronary angioplasty   . MI (myocardial infarction) ([place])   . Presence of bare metal stent in LAD coronary artery - VerifFlex BMS 3.0 mm x 12 mm -  post-dilated to 3.5 mm 08/01/2012  . Unstable angina ([place]) 07/30/2012  . Gastritis 12/15/2010  . Nausea 08/30/2010  . Hyperlipidemia with target low density lipoprotein (LDL) cholesterol less than 70 mg/dL 05/21/2006  . CATARACT NOS 05/21/2006  . Essential hypertension 05/21/2006  . MYOCARDIAL INFARCTION, HX OF 05/21/2006  . GERD 05/21/2006  . OVERACTIVE BLADDER 05/21/2006  . OSTEOARTHRITIS 05/21/2006  . URINARY INCONTINENCE 05/21/2006    Past Surgical History:  Procedure Laterality Date  . CARDIAC CATHETERIZATION  01/30/2007   D1 75 %, mid LAD 50%, 50-70% circumflex, 50-70% RCA.(Dr. Adora Fridge)  . CARDIAC CATHETERIZATION  12/11/2001   normal L main, small RCA, dominant LL Cfx with mild diffuse disease, LAD with mid 10-20% stenosis, ramus intermedius with mild diffuse disease (Dr. Jackie Plum)  . CATARACT EXTRACTION    . COLONOSCOPY  OCT 2011 ARS   S[facility] [place]   in setting of MI  . ESOPHAGOGASTRODUODENOSCOPY N/A 10/09/2016   Procedure: ESOPHAGOGASTRODUODENOSCOPY (EGD);  Surgeon: Danie Binder, MD;  [facility];  Service: Endoscopy;  Laterality: N/A;  . FEMUR IM NAIL Right 12/01/2016   Procedure: INTRAMEDULLARY (IM) NAIL RIGHT HIP;  Surgeon: Altamese Pixley, MD;  Location: [place];  Service: Orthopedics;  Laterality: Right;  . FEMUR IM NAIL Left 05/07/2018   Procedure: INTRAMEDULLARY (IM) NAIL FEMORAL;  Surgeon: Nicholes Stairs, MD;  Location: [place];  Service: Orthopedics;  Laterality: Left;  . LEFT HEART CATH AND CORONARY ANGIOGRAPHY N/A 10/23/2016   Procedure: Left Heart Cath and Coronary Angiography;  Surgeon: Sherren Mocha, MD;  Location: [place] CV LAB;  Service: Cardiovascular: Two-vessel CAD (left dominant).  Patent BMS in p LAD.  Severe, heavily calcified dCx-LPDA lesion.  Not favorable for PCI.  Marland Kitchen LEFT HEART CATHETERIZATION WITH CORONARY ANGIOGRAM N/A 08/01/2012   Procedure: LEFT HEART CATHETERIZATION WITH CORONARY ANGIOGRAM;   Surgeon: Leonie Man, MD;  Location: [facility] CATH LAB: Mid LAD 99% apple core; D2 ostial 70-80% (2 small for PCI) small nondominant RCA 60-70%. Distal circumflex/L PDA ~50% --> PCI of LAD  . NM MYOCAR PERF WALL MOTION  08/2003   adenosine stress - focal decreased perfusion defect in distal inferior wall, no significant ischemic changes  . PERCUTANEOUS STENT INTERVENTION  08/01/2012   Procedure: PERCUTANEOUS STENT INTERVENTION;  Surgeon: Leonie Man, MD;  Location: [facility] CATH LAB; ;PCI of mid LAD with a VeriFlex Bare Metal Stent 3.0 mm x 12 mm - post-dilated to 3.5 mm.  . TRANSTHORACIC ECHOCARDIOGRAM  10/2016   mild LVH - 60-65%. Gr 1 DD. No RWMA. Mild MR. Mod TR. Mild RA dilation.  PAP ~ 60 mmHg.  Marland Kitchen UPPER GASTROINTESTINAL ENDOSCOPY  08/2009   MELENA, HEMATEMESIS --> DUODENAL ULCER, Bx: H PYLORI POS       Family History  Problem Relation Age of Onset  . Heart disease Mother   . Kidney disease Father   . Heart disease Brother 43  . Heart disease Sister   . Colon cancer Neg Hx     Social History  Tobacco Use  . Smoking status: Never  . Smokeless tobacco: Never  Vaping Use  . Vaping Use: Never used  Substance Use Topics  . Alcohol use: No  . Drug use: No    Home Medications Prior to Admission medications   Medication Sig Start Date End Date Taking? Authorizing Provider  acetaminophen (TYLENOL) 325 MG tablet Take 2 tablets (650 mg total) by mouth 3 (three) times daily as needed (pain). Patient taking differently: Take 650 mg by mouth in the morning and at bedtime. 12/04/16   Ainsley Spinner, PA-C  alum & mag hydroxide-simeth (MAALOX/MYLANTA) 200-200-20 MG/5ML suspension Take 30 mLs by mouth every 4 (four) hours as needed for indigestion. 01/29/19   Johnson, Clanford L, MD  aspirin EC 81 MG tablet Take 1 tablet (81 mg total) by mouth daily. Swallow whole. 10/01/19   Strader, Fransisco Hertz, PA-C  atorvastatin (LIPITOR) 80 MG tablet Take 1 tablet (80 mg total) by mouth daily at 6 PM. 10/14/20    Verta Ellen., NP  bisoprolol (ZEBETA) 5 MG tablet Take 2.5 mg by mouth daily.    [provider]  calcium carbonate (OS-CAL - DOSED IN MG OF ELEMENTAL CALCIUM) 1250 (500 Ca) MG tablet Take 1 tablet (500 mg of elemental calcium total) by mouth daily with breakfast. 05/13/18   Mullis, Kiersten P, DO  dextromethorphan-guaiFENesin (ROBITUSSIN-DM) 10-100 MG/5ML liquid TAKE 2 TEASPOONSFUL BY MOUTH TWO TIMES A DAY AS NEEDED FOR COUGH 12/17/19   [provider]  donepezil (ARICEPT) 10 MG tablet Take 1 tablet (10 mg total) by mouth at bedtime. 11/01/20   Tacy Learn, PA-C  hydrOXYzine (ATARAX/VISTARIL) 25 MG tablet Take 1 at bedtime if needed for sleep 11/01/20   Tacy Learn, PA-C  ipratropium-albuterol (DUONEB) 0.5-2.5 (3) MG/3ML SOLN Take 3 mLs by nebulization 3 (three) times daily. 01/29/19   Johnson, Clanford L, MD  isosorbide mononitrate (IMDUR) 30 MG 24 hr tablet Take 90 mg by mouth daily.    [provider]  mirtazapine (REMERON) 7.5 MG tablet TAKE ONE TABLET BY MOUTH AT BEDTIME FOR DEPRESSION 06/25/20   [provider]  Multiple Vitamin (MULTIVITAMIN WITH MINERALS) TABS tablet Take 1 tablet by mouth daily.    [provider]  NITROSTAT 0.4 MG SL tablet PLACE 1 TABLET UNDER TONGUE EVERY 5 MINUTES FOR 3 DOSES AS NEEDED. Patient taking differently: Place 0.4 mg under the tongue every 5 (five) minutes as needed for chest pain. 11/17/13   Leonie Man, MD  pantoprazole (PROTONIX) 40 MG tablet Take 1 tablet (40 mg total) by mouth daily. 01/30/19   Johnson, Clanford L, MD  polyethylene glycol (MIRALAX / GLYCOLAX) 17 g packet Take 17 g by mouth as needed. 04/19/20   Geradine Girt, DO  torsemide (DEMADEX) 20 MG tablet Take 2 tablets (40 mg total) by mouth daily. 10/14/20   Verta Ellen., NP  Vitamin D, Ergocalciferol, (DRISDOL) 1.25 MG (50000 UT) CAPS capsule Take 1 capsule (50,000 Units total) by mouth every 7 (seven) days. 05/16/18   Mullis, Kiersten  P, DO  Zinc Oxide 10 % OINT Apply 1 application topically daily as needed.    [provider]    Allergies    Lisinopril  Review of Systems   Review of Systems  Unable to perform ROS: Acuity of condition  Respiratory:  Positive for shortness of breath.    all other systems are negative except as noted in the HPI and PMH.   Physical Exam  Updated Vital Signs BP (!) 88/62   Pulse 70   Resp 14   Ht '5\' 4"'$  (1.626 m)   Wt 64 kg   SpO2 (!) 70%   BMI 24.22 kg/m   Physical Exam Constitutional:      Appearance: He is ill-appearing.     Comments: Cachectic, chronically ill-appearing  HENT:     Head: Normocephalic and atraumatic.     Mouth/Throat:     Mouth: Mucous membranes are dry.  Eyes:     Extraocular Movements: Extraocular movements intact.     Pupils: Pupils are equal, round, and reactive to light.     Comments: Pale appearing  Cardiovascular:     Rate and Rhythm: Normal rate.  Pulmonary:     Effort: Respiratory distress present.     Comments: Mild tachypnea, no significant increased work of breathing, bilateral rhonchi. Abdominal:     Tenderness: There is no abdominal tenderness. There is no guarding or rebound.  Musculoskeletal:     Right lower leg: No edema.     Left lower leg: No edema.  Neurological:     Comments: Moves all extremities and follows commands    ED Results / Procedures / Treatments   Labs (all labs ordered are listed, but only abnormal results are displayed) Labs Reviewed  CBC WITH DIFFERENTIAL/PLATELET - Abnormal; Notable for the following components:      Result Value   WBC 10.6 (*)    RDW 17.5 (*)    Platelets 137 (*)    nRBC 0.4 (*)    Neutro Abs 8.3 (*)    All other components within normal limits  BLOOD GAS, ARTERIAL - Abnormal; Notable for the following components:   pH, Arterial 7.308 (*)    pO2, Arterial 155 (*)    Bicarbonate 18.4 (*)    Acid-base deficit 7.5 (*)    Allens test (pass/fail) BRACHIAL ARTERY (*)    All  other components within normal limits  RESP PANEL BY RT-PCR (FLU A&B, COVID) [id number]  CULTURE, BLOOD (ROUTINE X 2)  CULTURE, BLOOD (ROUTINE X 2)  COMPREHENSIVE METABOLIC PANEL  LACTIC ACID, PLASMA  LACTIC ACID, PLASMA  BRAIN NATRIURETIC PEPTIDE  POC OCCULT BLOOD, ED  TROPONIN I (HIGH SENSITIVITY)    EKG EKG Interpretation  Date/Time:  2020/11/24 06:21:40 EDT Ventricular Rate:  75 PR Interval:  147 QRS Duration: 102 QT Interval:  499 QTC Calculation: 558 R Axis:   236 Text Interpretation: Sinus tachycardia Multiform ventricular premature complexes Markedly posterior QRS axis Borderline low voltage, extremity leads Consider anterior infarct Prolonged QT interval No significant change was found Confirmed by Ezequiel Essex [phone]) on 24-Nov-2020 6:35:44 AM  Radiology DG Chest Portable 1 View  Result Date: 2020/11/24 CLINICAL DATA:  87 year old male with history of shortness of breath and chest pain. EXAM: PORTABLE CHEST 1 VIEW COMPARISON:  Chest x-ray 08/24/2020. FINDINGS: Lung volumes are low with bibasilar opacities which may reflect areas of atelectasis and/or consolidation. Small left pleural effusion. No definite right pleural effusion. No pneumothorax. Widespread areas of interstitial prominence which are chronic and similar to the prior examination. No evidence of pulmonary edema. Heart size is mildly enlarged. The patient is rotated to the left on today's exam, resulting in distortion of the mediastinal contours and reduced diagnostic sensitivity and specificity for mediastinal pathology. Atherosclerotic calcifications in the thoracic aorta. IMPRESSION: 1. Low lung volumes with bibasilar areas of atelectasis and/or consolidation. 2. Small left pleural effusion. 3. Mild cardiomegaly. 4. Aortic atherosclerosis.  Electronically Signed   By: Vinnie Langton M.D.   On: 25-Nov-2020 07:22    Procedures .Critical Care  Date/Time: 2020-11-25 7:32 AM Performed by: Ezequiel Essex,  MD Authorized by: Ezequiel Essex, MD   Critical care provider statement:    Critical care time (minutes):  60   Critical care was necessary to treat or prevent imminent or life-threatening deterioration of the following conditions:  Respiratory failure   Critical care was time spent personally by me on the following activities:  Discussions with consultants, evaluation of patient's response to treatment, examination of patient, ordering and performing treatments and interventions, ordering and review of laboratory studies, ordering and review of radiographic studies, pulse oximetry, re-evaluation of patient's condition, obtaining history from patient or surrogate and review of old charts   Medications Ordered in ED Medications  sodium chloride 0.9 % bolus 1,000 mL (1,000 mLs Intravenous New Bag/Given 2020-11-25 [id number])    ED Course  I have reviewed the triage vital signs and the nursing notes.  Pertinent labs & imaging results that were available during my care of the patient were reviewed by me and considered in my medical decision making (see chart for details).  Clinical Course as of 11-25-2020 0955  Wed 11-25-2020  0745 CBC with mild leukocytosis, BNP elevated. CMP with marked AKI compared to previous. Trop is elevated, possibly due to AKI vs ACS vs hypoxia.  [CS]  [id number] Patient continues to rest comfortably in no distress. Spoke with Dr. Curly Rim, Hospitalist, who will evaluate for admission.  [CS]    Clinical Course User Index [CS] Truddie Hidden, MD   MDM Rules/Calculators/A&P                          Patient arrives by EMS with increased shortness of breath and hypoxia.  On initial arrival O2 saturation was 40% on his 5 L and he was hypotensive in the 70s.  Uncertain the validity of the pulse ox measurement.  Placed on HFNC, NRB added.  Discussed with patient's daughter Tahmid Dilone on arrival.  Discussed acuity of situation that he is hypotensive and hypoxic and quite  ill-appearing. She confirms that he is DNR and DNI and just wants him to be comfortable.  She is agreeable to IV fluids and antibiotics.  Does not want intubation or CPR.  ABG shows a pulse oximetry reading of 99% PO2 155.  His peripheral oximeter appears unreliable. No significant CO2 retentions.  Chest x-ray is consistent consistent with interstitial lung disease and atelectasis  Patient treated empirically for pneumonia with antibiotics.  His blood pressure responding to IV fluids and he is up to [id number] systolic.  He does not appear to be in respiratory distress and is moving air well. Empiric antibiotics given for any possible infectious component of  presentation after cultures obtained. Pulmonary embolism considered as well.  Family is on the way.  They confirm that he is DNR and DNI.  They are agreeable to IV fluids, antibiotics and BiPAP as needed Labs pending. Patient appears comfortable on HFNC plus nonbreather. Pulse oximeter readings still unreliable.  Care transferred to Dr. Karle Starch at shift change.  Patient will need admission.  Consider CTA to r/o pulmonary embolism. Per daughter Santiago Glad, she may wish to progress to comfort care after family arrives.  Final Clinical Impression(s) / ED Diagnoses Final diagnoses:  None    Rx / DC Orders ED Discharge Orders     None  Ezequiel Essex, MD Nov 29, 2020 [id number]    Ezequiel Essex, MD 11-29-2020 [id number]    Ezequiel Essex, MD 11-29-20 [phone]

## 2020-12-16 NOTE — Death Summary Note (Signed)
DEATH SUMMARY   Patient Details  Name: Anthony Galvan MRN: [id number] DOB: May 13, 1921  Admission/Discharge Information   Admit Date:  22-Nov-2020  Date of Death: Date of Death: November 22, 2020  Time of Death: Time of Death: 1743/07/09  Length of Stay: 0  Referring Physician: [facility] [place]   Reason(s) for Hospitalization  Anthony Galvan is a 87 y.o. male with medical history significant for advanced dementia, CAD, stage V CKD, ILD with bronchiectasis, GERD, duodenal ulcer, Hyperlipidemia, HTN, OA, stage 1 sacral pressure wound, advanced age who for the past several days has been having a increasing shortness of breath and chest pain symptoms at home.  Daughter has been caring for him at home and reporting that she has been giving him sublingual nitroglycerin tablets for most of the night because of complaints of pain.  He also complained of shortness of breath.  He was found by EMS to be hypoxic with oxygenation in the 70s.  He was also noted to be hypotensive.  Daughter reported that patient had stopped eating and drinking over the last several days.   ED Course: Patient was given IV fluid bolus hydration in the ED with improvement in blood pressure however subsequently BP began to worsen and patient began to complain of pain and discomfort.  Daughter confirmed DNR DNI status.  Goals of care meeting with attending physician and daughter healthcare power of attorney and decision was made to pursue full comfort care measures due to ongoing complaints of abdominal pain and shortness of breath with chest pain symptoms.  Patient admitted for full comfort measures.  Diagnoses  Preliminary cause of death: Acute renal failure Secondary Diagnoses (including complications and co-morbidities):  Principal Problem:   Acute renal failure (ARF) (HCC) Active Problems:   Hyperlipidemia with target low density lipoprotein (LDL) cholesterol less than 70 mg/dL   CATARACT NOS   Essential hypertension    MYOCARDIAL INFARCTION, HX OF   GERD (gastroesophageal reflux disease)   OVERACTIVE BLADDER   URINARY INCONTINENCE   Unstable angina (HCC)   Presence of bare metal stent in LAD coronary artery - VerifFlex BMS 3.0 mm x 12 mm - post-dilated to 3.5 mm   CAD S/P percutaneous coronary angioplasty   History of coronary artery stent placement   CKD (chronic kidney disease), stage V (HCC)   (HFpEF) heart failure with preserved ejection fraction (HCC)   Pressure injury of skin   ILD (interstitial lung disease) ([place])   DNR (do not resuscitate)  Brief Hospital Course (including significant findings, care, treatment, and services provided and events leading to death)  Anthony Galvan is a 87 y.o. year old male who was admitted for full comfort measures.  He presented with multiple complaints of shortness of breath, hypoxia, hypotension, chest discomfort, abdominal pain and pain in the sacral decubitus area.  He had been having poor oral intake for past several days per family and increasing symptoms of dementia and agitation. He was found to have an acute on chronic renal failure and severe dehydration and hypotension.  After discussion with family for goals of care and inpatient palliative care consultation patient was made full comfort measures.  Pt expired on 2020-11-22.  Pt was pronounced by the RN.    Pertinent Labs and Studies  Significant Diagnostic Studies DG Ankle Complete Right  Result Date: 11/01/2020 CLINICAL DATA:  Fall. EXAM: RIGHT ANKLE - COMPLETE 3+ VIEW COMPARISON:  None. FINDINGS: Age indeterminate mildly displaced fracture of the medial malleolus, best seen on  the oblique radiograph. No joint malalignment. Unremarkable soft tissues. Osteopenia. Atherosclerotic vascular calcifications. IMPRESSION: 1. Age indeterminate, mildly displaced fracture of the medial malleolus. Recommend correlation with the presence or absence of point tenderness. 2. No joint malalignment. 3. Osteopenia. Electronically  Signed   By: Margaretha Sheffield MD   On: 11/01/2020 12:55   CT Head Wo Contrast  Result Date: 11/01/2020 CLINICAL DATA:  Pt daughter states she fell asleep for 5 minutes and he slid in the floor EXAM: CT HEAD WITHOUT CONTRAST TECHNIQUE: Contiguous axial images were obtained from the base of the skull through the vertex without intravenous contrast. COMPARISON:  08/22/2020 FINDINGS: Brain: No evidence of acute infarction, hemorrhage, extra-axial collection, ventriculomegaly, or mass effect. Generalized cerebral atrophy. Periventricular white matter low attenuation likely secondary to microangiopathy. Vascular: Cerebrovascular atherosclerotic calcifications are noted. Skull: Negative for fracture or focal lesion. Sinuses/Orbits: Visualized portions of the orbits are unremarkable. Visualized portions of the paranasal sinuses are unremarkable. Visualized portions of the mastoid air cells are unremarkable. Other: None. IMPRESSION: 1. No acute intracranial pathology. 2. Chronic microvascular disease and cerebral atrophy. Electronically Signed   By: Kathreen Devoid   On: 11/01/2020 13:15   DG Chest Portable 1 View  Result Date: 11-22-2020 CLINICAL DATA:  87 year old male with history of shortness of breath and chest pain. EXAM: PORTABLE CHEST 1 VIEW COMPARISON:  Chest x-ray 08/24/2020. FINDINGS: Lung volumes are low with bibasilar opacities which may reflect areas of atelectasis and/or consolidation. Small left pleural effusion. No definite right pleural effusion. No pneumothorax. Widespread areas of interstitial prominence which are chronic and similar to the prior examination. No evidence of pulmonary edema. Heart size is mildly enlarged. The patient is rotated to the left on today's exam, resulting in distortion of the mediastinal contours and reduced diagnostic sensitivity and specificity for mediastinal pathology. Atherosclerotic calcifications in the thoracic aorta. IMPRESSION: 1. Low lung volumes with bibasilar  areas of atelectasis and/or consolidation. 2. Small left pleural effusion. 3. Mild cardiomegaly. 4. Aortic atherosclerosis. Electronically Signed   By: Vinnie Langton M.D.   On: November 22, 2020 07:22    Microbiology Recent Results (from the past 240 hour(s))  Resp Panel by RT-PCR (Flu A&B, Covid) Nasopharyngeal Swab     Status: None   Collection Time: 11/22/2020  6:30 AM   Specimen: Nasopharyngeal Swab; Nasopharyngeal(NP) swabs in vial transport medium  Result Value Ref Range Status   SARS Coronavirus 2 by RT PCR NEGATIVE NEGATIVE Final    Comment: (NOTE) SARS-CoV-2 target nucleic acids are NOT DETECTED.  The SARS-CoV-2 RNA is generally detectable in upper respiratory specimens during the acute phase of infection. The lowest concentration of SARS-CoV-2 viral copies this assay can detect is 138 copies/mL. A negative result does not preclude SARS-Cov-2 infection and should not be used as the sole basis for treatment or other patient management decisions. A negative result may occur with  improper specimen collection/handling, submission of specimen other than nasopharyngeal swab, presence of viral mutation(s) within the areas targeted by this assay, and inadequate number of viral copies(<138 copies/mL). A negative result must be combined with clinical observations, patient history, and epidemiological information. The expected result is Negative.  Fact Sheet for Patients:  EntrepreneurPulse.com.au  Fact Sheet for Healthcare Providers:  IncredibleEmployment.be  This test is no t yet approved or cleared by the [place] FDA and  has been authorized for detection and/or diagnosis of SARS-CoV-2 by FDA under an Emergency Use Authorization (EUA). This EUA will remain  in effect (meaning this test  can be used) for the duration of the COVID-19 declaration under Section 564(b)(1) of the Act, 21 U.S.C.section 360bbb-3(b)(1), unless the authorization is  terminated  or revoked sooner.       Influenza A by PCR NEGATIVE NEGATIVE Final   Influenza B by PCR NEGATIVE NEGATIVE Final    Comment: (NOTE) The Xpert Xpress SARS-CoV-2/FLU/RSV plus assay is intended as an aid in the diagnosis of influenza from Nasopharyngeal swab specimens and should not be used as a sole basis for treatment. Nasal washings and aspirates are unacceptable for Xpert Xpress SARS-CoV-2/FLU/RSV testing.  Fact Sheet for Patients: EntrepreneurPulse.com.au  Fact Sheet for Healthcare Providers: IncredibleEmployment.be  This test is not yet approved or cleared by the [place] FDA and has been authorized for detection and/or diagnosis of SARS-CoV-2 by FDA under an Emergency Use Authorization (EUA). This EUA will remain in effect (meaning this test can be used) for the duration of the COVID-19 declaration under Section 564(b)(1) of the Act, 21 U.S.C. section 360bbb-3(b)(1), unless the authorization is terminated or revoked.  Performed at [facility], [address], [place], [place] [address]   Blood culture (routine x 2)     Status: None (Preliminary result)   Collection Time: 2020-12-14  7:02 AM   Specimen: BLOOD LEFT ARM  Result Value Ref Range Status   Specimen Description BLOOD LEFT ARM  Final   Special Requests   Final    AEROBIC BOTTLE ONLY Blood Culture results may not be optimal due to an inadequate volume of blood received in culture bottles Performed at [facility], [address], [place], [place] [address]    Culture PENDING  Incomplete   Report Status PENDING  Incomplete  Blood culture (routine x 2)     Status: None (Preliminary result)   Collection Time: 14-Dec-2020  7:03 AM   Specimen: BLOOD LEFT HAND  Result Value Ref Range Status   Specimen Description BLOOD LEFT HAND  Final   Special Requests   Final    AEROBIC BOTTLE ONLY Blood Culture results may not be optimal due to an inadequate volume of blood received  in culture bottles Performed at [facility], [address] [place], [place] [address]    Culture PENDING  Incomplete   Report Status PENDING  Incomplete    Lab Basic Metabolic Panel: Recent Labs  Lab 12/14/2020 0703  NA 135  K 4.7  CL 99  CO2 18*  GLUCOSE 81  BUN 87*  CREATININE 5.62*  CALCIUM 8.2*   Liver Function Tests: Recent Labs  Lab 2020-12-14 0703  AST 56*  ALT 25  ALKPHOS 61  BILITOT 1.3*  PROT 6.6  ALBUMIN 3.2*   No results for input(s): LIPASE, AMYLASE in the last 168 hours. No results for input(s): AMMONIA in the last 168 hours. CBC: Recent Labs  Lab 12-14-20 0703  WBC 10.6*  NEUTROABS 8.3*  HGB 13.4  HCT 43.6  MCV 98.4  PLT 137*   Cardiac Enzymes: No results for input(s): CKTOTAL, CKMB, CKMBINDEX, TROPONINI in the last 168 hours. Sepsis Labs: Recent Labs  Lab 2020/12/14 0703  WBC 10.6*    Procedures/Operations   Rosemaria Inabinet MD  How to contact the [facility] Attending or Consulting provider [place] or covering provider during after hours [place], for this patient?  Check the care team in [facility] and look for a) attending/consulting [facility] provider listed and b) the [facility] team listed Log into [email or web address] and use [place]'s universal password to access. If you  do not have the password, please contact the hospital operator. Locate the [facility] provider you are looking for under [facility] and page to a number that you can be directly reached. If you still have difficulty reaching the provider, please page the [facility] (Director on Call) for the Hospitalists listed on amion for assistance.  12/04/2020, 6:05 PM

## 2020-12-16 NOTE — ED Provider Notes (Signed)
Care of the patient assumed at the change of shift. Here for worsening SOB in setting of interstitial lung disease. Noted to be hypoxic but SpO2 monitor not giving good readings. ABG shows good oxygenation. Also hypotensive. Getting IVF and Abx for sepsis. Awake and alert, no distress. DNR/DNI Physical Exam   BP (!) 89/65   Pulse 62   Temp (!) 96 F (35.6 C) (Rectal)   Resp 18   Ht '5\' 4"'$  (1.626 m)   Wt 64 kg   SpO2 (!) 51%   BMI 24.22 kg/m   Physical Exam Awake and alert No resp distress ED Course/Procedures   Clinical Course as of 12/01/2020 0955  Wed 01-Dec-2020  0745 CBC with mild leukocytosis, BNP elevated. CMP with marked AKI compared to previous. Trop is elevated, possibly due to AKI vs ACS vs hypoxia.  [CS]  [id number] Patient continues to rest comfortably in no distress. Spoke with Dr. Curly Rim, Hospitalist, who will evaluate for admission.  [CS]    Clinical Course User Index [CS] Truddie Hidden, MD    .Critical Care  Date/Time: Dec 01, 2020 9:54 AM Performed by: Truddie Hidden, MD Authorized by: Truddie Hidden, MD   Critical care provider statement:    Critical care time (minutes):  45   Critical care time was exclusive of:  Separately billable procedures and treating other patients   Critical care was necessary to treat or prevent imminent or life-threatening deterioration of the following conditions:  Renal failure and respiratory failure   Critical care was time spent personally by me on the following activities:  Discussions with consultants, evaluation of patient's response to treatment, examination of patient, ordering and performing treatments and interventions, ordering and review of laboratory studies, ordering and review of radiographic studies, pulse oximetry, re-evaluation of patient's condition, obtaining history from patient or surrogate and review of old charts   I assumed direction of critical care for this patient from another provider in my  specialty: yes     Care discussed with: admitting provider    MDM         Truddie Hidden, MD 12/01/2020 [phone]

## 2020-12-16 NOTE — Plan of Care (Signed)
  Problem: Coping: Goal: Level of anxiety will decrease Outcome: Progressing   Problem: Pain Managment: Goal: General experience of comfort will improve Outcome: Progressing   

## 2020-12-16 NOTE — ED Notes (Signed)
Patient repositioned on left side with pillow placed underneath buttocks

## 2020-12-16 NOTE — ED Notes (Signed)
Patient had bowel movement. Patient has stage 1 ulcer to sacrum with excoriation to rectum area and scrotum. Sacral dressing and barrier cream applied and provided with new brief.

## 2020-12-16 NOTE — Progress Notes (Signed)
[facility]) Hospital Liaison note:  This patient is currently enrolled in [facility] outpatient-based Palliative Care. Will continue to follow for disposition.  Please call with any outpatient palliative questions or concerns.  Thank you, Lorelee Market, LPN [facility] Liaison [phone]

## 2020-12-16 NOTE — Progress Notes (Signed)
Referral for donation of anatomical gift to [facility] made @ 1812. Pt was not suitable for donation of organs, tissues, or eyes due to chronological age. Referral # [id number], spoke with Murlean Caller.

## 2020-12-16 NOTE — ED Triage Notes (Signed)
Ems called out for cardiac arrest, when ems arrived pt was hypoxic and hypotensive. Pt is on continuous o2 at 5lpm.

## 2020-12-16 NOTE — ED Notes (Signed)
Patient's daughter called out for abdominal pain per patient. Patient falls asleep between conversation and hypotensive at 72/50. Family and patient educated on withholding pain medication due to BP. Dr. Wynetta Emery messaged at this time.

## 2020-12-16 NOTE — ED Notes (Signed)
Patient alert and speaking with this nurse and follows commands. Oxygen saturation states 59% on 30L/[place] on monitor. Patient is DNR/DNI.

## 2020-12-16 DEATH — deceased

## 2022-02-09 IMAGING — DX DG CHEST 1V PORT
1 series · 1 of 1 positions shown · non-contrast
Comparison: Chest radiograph dated 08/18/2020 and CT dated
08/18/2020

CLINICAL DATA: [AGE] male with epistaxis.

EXAM:
PORTABLE CHEST 1 VIEW

[chest ap]
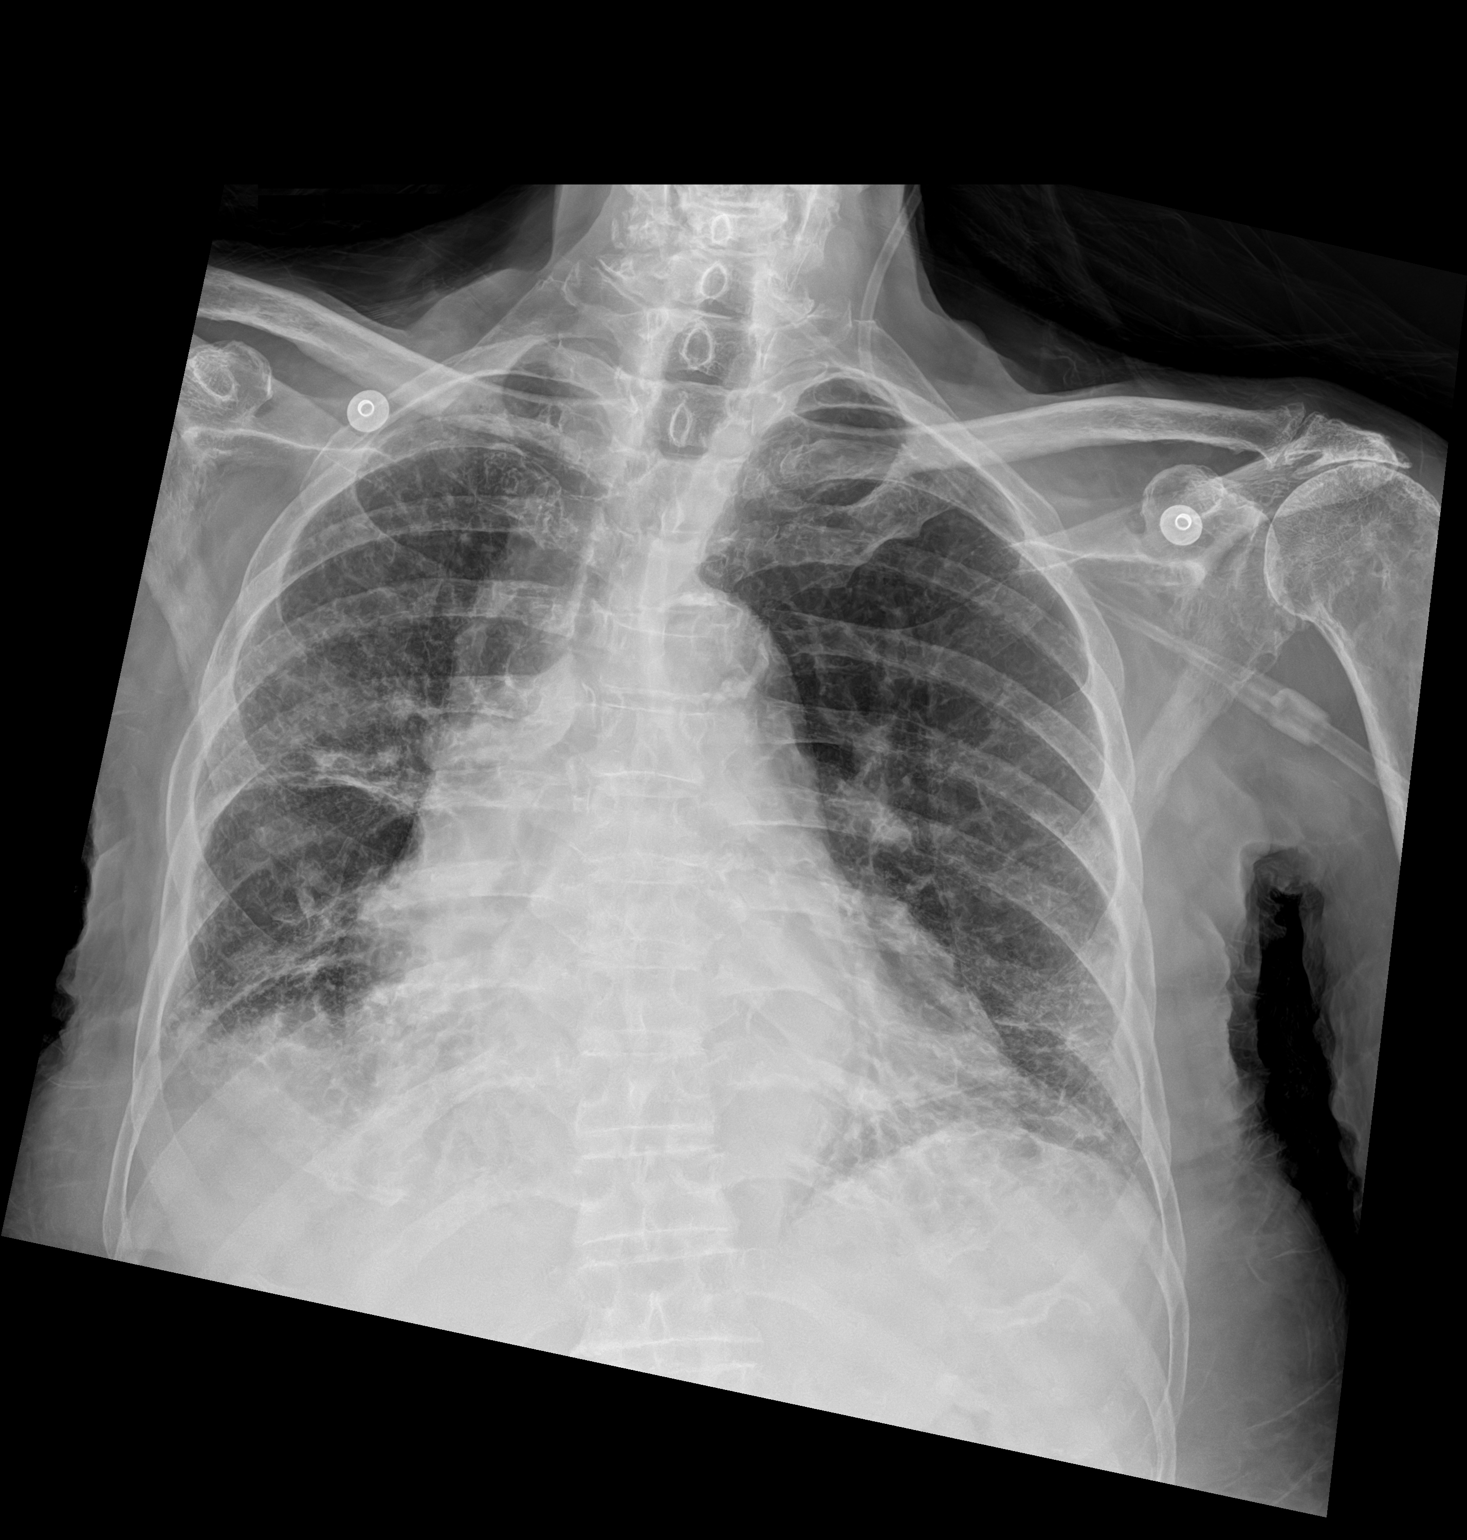

[1 of 1 positions shown; findings below may reference images not displayed]

FINDINGS: There is diffuse chronic interstitial coarsening and bibasilar
atelectasis/scarring. No focal consolidation, pleural effusion, or
pneumothorax. Stable mild cardiomegaly. Atherosclerotic
calcification of the aorta. Osteopenia with degenerative changes of
the spine. No acute osseous pathology.
IMPRESSION: No active disease.

## 2022-05-05 IMAGING — DX DG CHEST 1V PORT
1 series · 1 of 1 positions shown · non-contrast
Comparison: Chest x-ray 08/24/2020.

CLINICAL DATA: [AGE] male with history of shortness of breath
and chest pain.

EXAM:
PORTABLE CHEST 1 VIEW

[chest ap]
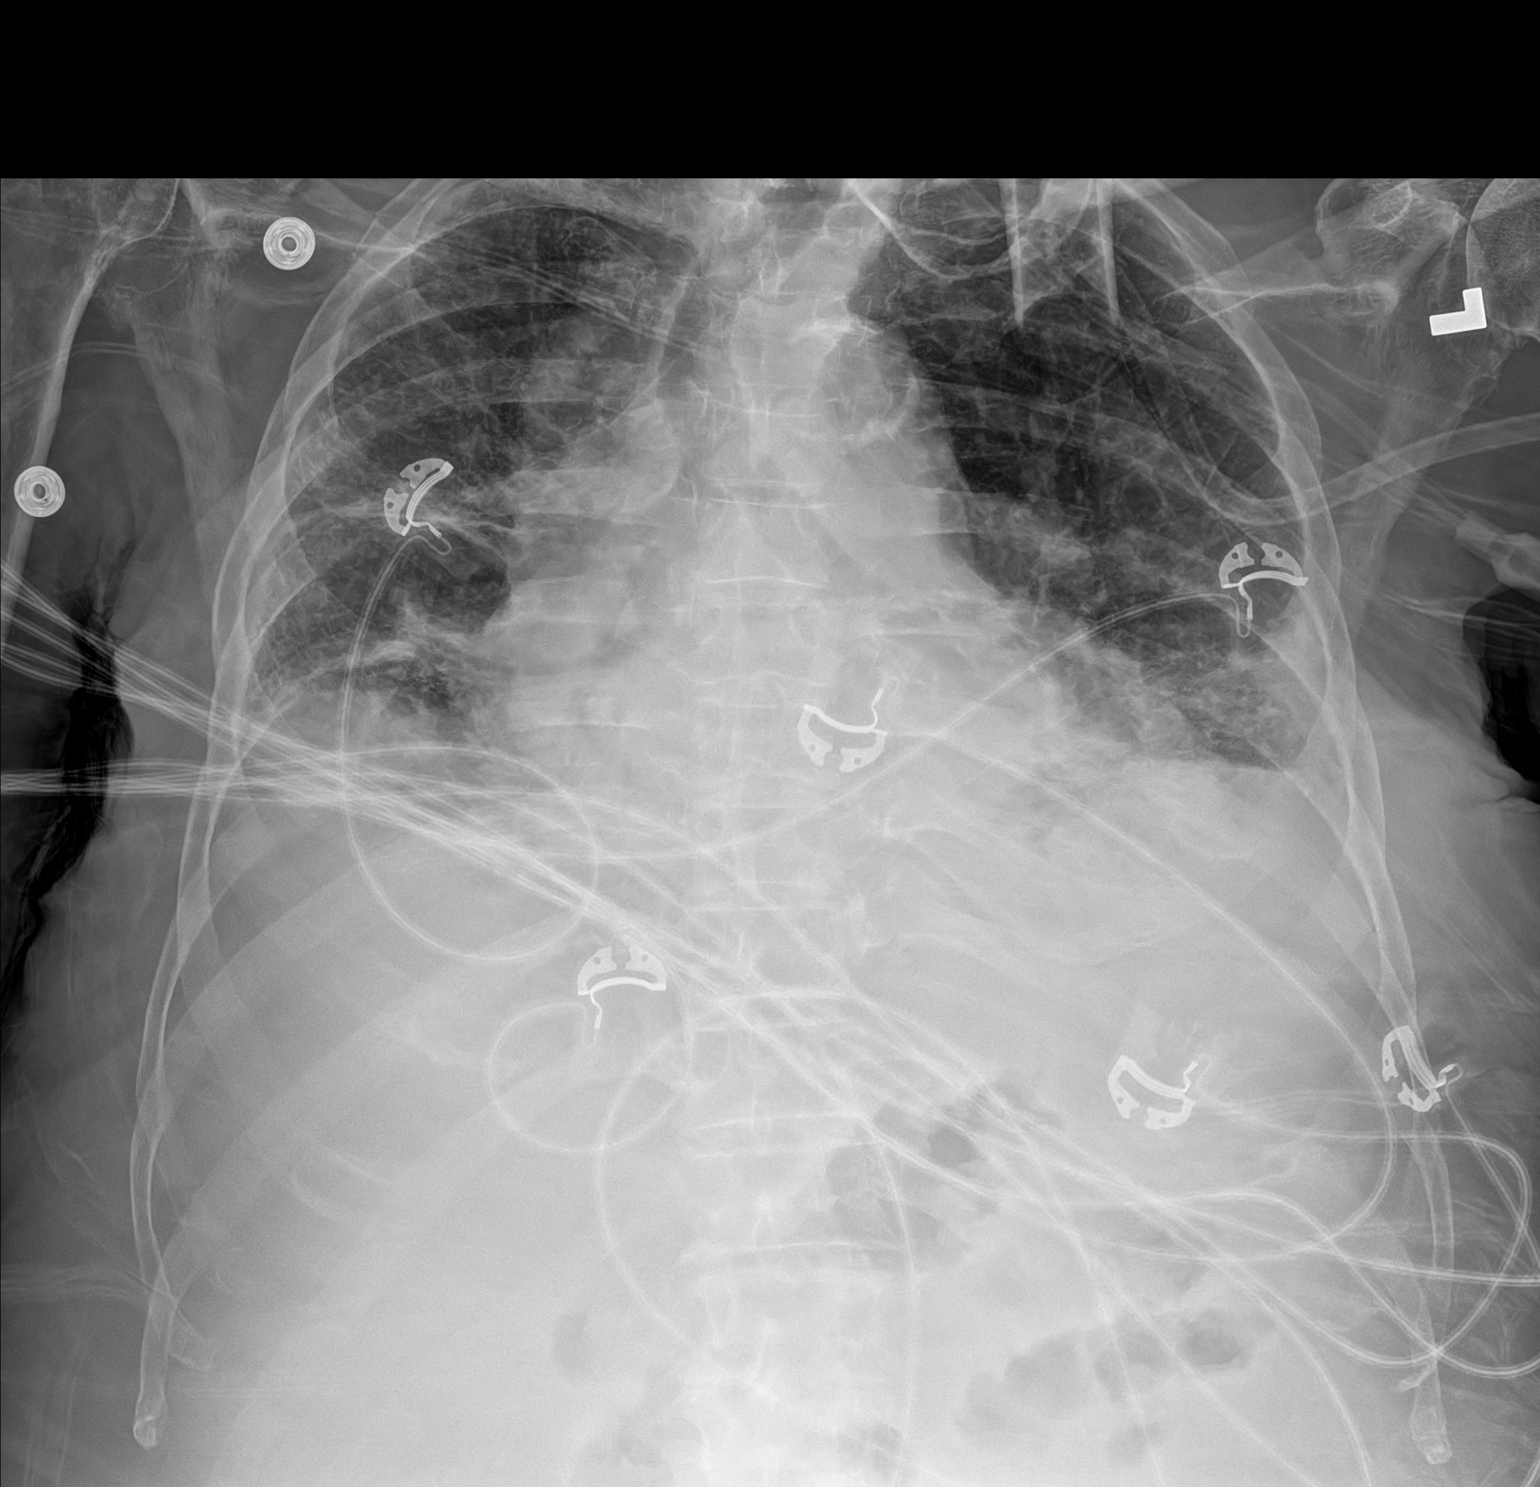

[1 of 1 positions shown; findings below may reference images not displayed]

FINDINGS: Lung volumes are low with bibasilar opacities which may reflect
areas of atelectasis and/or consolidation. Small left pleural
effusion. No definite right pleural effusion. No pneumothorax.
Widespread areas of interstitial prominence which are chronic and
similar to the prior examination. No evidence of pulmonary edema.
Heart size is mildly enlarged. The patient is rotated to the left on
today's exam, resulting in distortion of the mediastinal contours
and reduced diagnostic sensitivity and specificity for mediastinal
pathology. Atherosclerotic calcifications in the thoracic aorta.
IMPRESSION: 1. Low lung volumes with bibasilar areas of atelectasis and/or
consolidation.
2. Small left pleural effusion.
3. Mild cardiomegaly.
4. Aortic atherosclerosis.
